# Patient Record
Sex: Female | Born: 1955 | Race: White | Hispanic: No | Marital: Married | Smoking: Current every day smoker
Health system: Southern US, Community
[De-identification: ages and names within clinical notes are randomized; demographics above are authoritative.]

## PROBLEM LIST (undated history)

## (undated) DIAGNOSIS — Z87442 Personal history of urinary calculi: Secondary | ICD-10-CM

## (undated) DIAGNOSIS — E119 Type 2 diabetes mellitus without complications: Secondary | ICD-10-CM

## (undated) DIAGNOSIS — E7800 Pure hypercholesterolemia, unspecified: Secondary | ICD-10-CM

## (undated) DIAGNOSIS — K2100 Gastro-esophageal reflux disease with esophagitis, without bleeding: Secondary | ICD-10-CM

## (undated) DIAGNOSIS — N2889 Other specified disorders of kidney and ureter: Secondary | ICD-10-CM

## (undated) DIAGNOSIS — M542 Cervicalgia: Secondary | ICD-10-CM

## (undated) DIAGNOSIS — N189 Chronic kidney disease, unspecified: Secondary | ICD-10-CM

## (undated) DIAGNOSIS — R079 Chest pain, unspecified: Secondary | ICD-10-CM

## (undated) DIAGNOSIS — R51 Headache: Secondary | ICD-10-CM

## (undated) DIAGNOSIS — I2510 Atherosclerotic heart disease of native coronary artery without angina pectoris: Secondary | ICD-10-CM

## (undated) DIAGNOSIS — R011 Cardiac murmur, unspecified: Secondary | ICD-10-CM

## (undated) DIAGNOSIS — R002 Palpitations: Secondary | ICD-10-CM

## (undated) DIAGNOSIS — I639 Cerebral infarction, unspecified: Secondary | ICD-10-CM

## (undated) DIAGNOSIS — G459 Transient cerebral ischemic attack, unspecified: Secondary | ICD-10-CM

## (undated) DIAGNOSIS — B159 Hepatitis A without hepatic coma: Secondary | ICD-10-CM

## (undated) DIAGNOSIS — M1990 Unspecified osteoarthritis, unspecified site: Secondary | ICD-10-CM

## (undated) DIAGNOSIS — I10 Essential (primary) hypertension: Secondary | ICD-10-CM

## (undated) DIAGNOSIS — C801 Malignant (primary) neoplasm, unspecified: Secondary | ICD-10-CM

## (undated) DIAGNOSIS — F32A Depression, unspecified: Secondary | ICD-10-CM

## (undated) DIAGNOSIS — K219 Gastro-esophageal reflux disease without esophagitis: Secondary | ICD-10-CM

## (undated) DIAGNOSIS — F17210 Nicotine dependence, cigarettes, uncomplicated: Secondary | ICD-10-CM

## (undated) DIAGNOSIS — F419 Anxiety disorder, unspecified: Secondary | ICD-10-CM

## (undated) DIAGNOSIS — R519 Headache, unspecified: Secondary | ICD-10-CM

## (undated) DIAGNOSIS — E669 Obesity, unspecified: Secondary | ICD-10-CM

## (undated) DIAGNOSIS — Z8719 Personal history of other diseases of the digestive system: Secondary | ICD-10-CM

## (undated) HISTORY — DX: Atherosclerotic heart disease of native coronary artery without angina pectoris: I25.10

## (undated) HISTORY — DX: Chest pain, unspecified: R07.9

## (undated) HISTORY — DX: Gastro-esophageal reflux disease with esophagitis, without bleeding: K21.00

## (undated) HISTORY — DX: Obesity, unspecified: E66.9

## (undated) HISTORY — DX: Cervicalgia: M54.2

## (undated) HISTORY — PX: LAPAROSCOPIC NEPHRECTOMY: SHX1930

## (undated) HISTORY — PX: NEPHRECTOMY: SHX65

## (undated) HISTORY — DX: Nicotine dependence, cigarettes, uncomplicated: F17.210

## (undated) HISTORY — PX: CAROTID STENT: SHX1301

## (undated) HISTORY — DX: Gastro-esophageal reflux disease with esophagitis: K21.0

## (undated) HISTORY — PX: HERNIA REPAIR: SHX51

## (undated) NOTE — *Deleted (*Deleted)
Diabetes Mellitus and Nutrition, Adult When you have diabetes (diabetes mellitus), it is very important to have healthy eating habits because your blood sugar (glucose) levels are greatly affected by what you eat and drink. Eating healthy foods in the appropriate amounts, at about the same times every day, can help you:  Control your blood glucose.  Lower your risk of heart disease.  Improve your blood pressure.  Reach or maintain a healthy weight. Every person with diabetes is different, and each person has different needs for a meal plan. Your health care provider may recommend that you work with a diet and nutrition specialist (dietitian) to make a meal plan that is best for you. Your meal plan may vary depending on factors such as:  The calories you need.  The medicines you take.  Your weight.  Your blood glucose, blood pressure, and cholesterol levels.  Your activity level.  Other health conditions you have, such as heart or kidney disease. How do carbohydrates affect me? Carbohydrates, also called carbs, affect your blood glucose level more than any other type of food. Eating carbs naturally raises the amount of glucose in your blood. Carb counting is a method for keeping track of how many carbs you eat. Counting carbs is important to keep your blood glucose at a healthy level, especially if you use insulin or take certain oral diabetes medicines. It is important to know how many carbs you can safely have in each meal. This is different for every person. Your dietitian can help you calculate how many carbs you should have at each meal and for each snack. Foods that contain carbs include:  Bread, cereal, rice, pasta, and crackers.  Potatoes and corn.  Peas, beans, and lentils.  Milk and yogurt.  Fruit and juice.  Desserts, such as cakes, cookies, ice cream, and candy. How does alcohol affect me? Alcohol can cause a sudden decrease in blood glucose (hypoglycemia),  especially if you use insulin or take certain oral diabetes medicines. Hypoglycemia can be a life-threatening condition. Symptoms of hypoglycemia (sleepiness, dizziness, and confusion) are similar to symptoms of having too much alcohol. If your health care provider says that alcohol is safe for you, follow these guidelines:  Limit alcohol intake to no more than 1 drink per day for nonpregnant women and 2 drinks per day for men. One drink equals 12 oz of beer, 5 oz of wine, or 1 oz of hard liquor.  Do not drink on an empty stomach.  Keep yourself hydrated with water, diet soda, or unsweetened iced tea.  Keep in mind that regular soda, juice, and other mixers may contain a lot of sugar and must be counted as carbs. What are tips for following this plan?  Reading food labels  Start by checking the serving size on the "Nutrition Facts" label of packaged foods and drinks. The amount of calories, carbs, fats, and other nutrients listed on the label is based on one serving of the item. Many items contain more than one serving per package.  Check the total grams (g) of carbs in one serving. You can calculate the number of servings of carbs in one serving by dividing the total carbs by 15. For example, if a food has 30 g of total carbs, it would be equal to 2 servings of carbs.  Check the number of grams (g) of saturated and trans fats in one serving. Choose foods that have low or no amount of these fats.  Check the number of   milligrams (mg) of salt (sodium) in one serving. Most people should limit total sodium intake to less than 2,300 mg per day.  Always check the nutrition information of foods labeled as "low-fat" or "nonfat". These foods may be higher in added sugar or refined carbs and should be avoided.  Talk to your dietitian to identify your daily goals for nutrients listed on the label. Shopping  Avoid buying canned, premade, or processed foods. These foods tend to be high in fat, sodium,  and added sugar.  Shop around the outside edge of the grocery store. This includes fresh fruits and vegetables, bulk grains, fresh meats, and fresh dairy. Cooking  Use low-heat cooking methods, such as baking, instead of high-heat cooking methods like deep frying.  Cook using healthy oils, such as olive, canola, or sunflower oil.  Avoid cooking with butter, cream, or high-fat meats. Meal planning  Eat meals and snacks regularly, preferably at the same times every day. Avoid going long periods of time without eating.  Eat foods high in fiber, such as fresh fruits, vegetables, beans, and whole grains. Talk to your dietitian about how many servings of carbs you can eat at each meal.  Eat 4-6 ounces (oz) of lean protein each day, such as lean meat, chicken, fish, eggs, or tofu. One oz of lean protein is equal to: ? 1 oz of meat, chicken, or fish. ? 1 egg. ?  cup of tofu.  Eat some foods each day that contain healthy fats, such as avocado, nuts, seeds, and fish. Lifestyle  Check your blood glucose regularly.  Exercise regularly as told by your health care provider. This may include: ? 150 minutes of moderate-intensity or vigorous-intensity exercise each week. This could be brisk walking, biking, or water aerobics. ? Stretching and doing strength exercises, such as yoga or weightlifting, at least 2 times a week.  Take medicines as told by your health care provider.  Do not use any products that contain nicotine or tobacco, such as cigarettes and e-cigarettes. If you need help quitting, ask your health care provider.  Work with a counselor or diabetes educator to identify strategies to manage stress and any emotional and social challenges. Questions to ask a health care provider  Do I need to meet with a diabetes educator?  Do I need to meet with a dietitian?  What number can I call if I have questions?  When are the best times to check my blood glucose? Where to find more  information:  American Diabetes Association: diabetes.org  Academy of Nutrition and Dietetics: www.eatright.org  National Institute of Diabetes and Digestive and Kidney Diseases (NIH): www.niddk.nih.gov Summary  A healthy meal plan will help you control your blood glucose and maintain a healthy lifestyle.  Working with a diet and nutrition specialist (dietitian) can help you make a meal plan that is best for you.  Keep in mind that carbohydrates (carbs) and alcohol have immediate effects on your blood glucose levels. It is important to count carbs and to use alcohol carefully. This information is not intended to replace advice given to you by your health care provider. Make sure you discuss any questions you have with your health care provider. Document Revised: 02/11/2017 Document Reviewed: 04/05/2016 Elsevier Patient Education  2020 Elsevier Inc.  

---

## 1976-03-15 HISTORY — PX: CHOLECYSTECTOMY OPEN: SUR202

## 1989-03-15 HISTORY — PX: VAGINAL HYSTERECTOMY: SUR661

## 2000-08-29 ENCOUNTER — Ambulatory Visit (HOSPITAL_COMMUNITY): Admission: RE | Admit: 2000-08-29 | Discharge: 2000-08-29 | Payer: Self-pay | Admitting: Pulmonary Disease

## 2000-10-12 ENCOUNTER — Other Ambulatory Visit: Admission: RE | Admit: 2000-10-12 | Discharge: 2000-10-12 | Payer: Self-pay | Admitting: Obstetrics and Gynecology

## 2000-11-06 ENCOUNTER — Encounter: Payer: Self-pay | Admitting: *Deleted

## 2000-11-07 ENCOUNTER — Encounter: Payer: Self-pay | Admitting: Cardiology

## 2000-11-07 ENCOUNTER — Inpatient Hospital Stay (HOSPITAL_COMMUNITY): Admission: EM | Admit: 2000-11-07 | Discharge: 2000-11-08 | Payer: Self-pay | Admitting: *Deleted

## 2003-04-08 ENCOUNTER — Emergency Department (HOSPITAL_COMMUNITY): Admission: EM | Admit: 2003-04-08 | Discharge: 2003-04-08 | Payer: Self-pay | Admitting: *Deleted

## 2003-07-01 ENCOUNTER — Ambulatory Visit (HOSPITAL_COMMUNITY): Admission: RE | Admit: 2003-07-01 | Discharge: 2003-07-01 | Payer: Self-pay | Admitting: Pulmonary Disease

## 2003-09-10 ENCOUNTER — Encounter: Admission: RE | Admit: 2003-09-10 | Discharge: 2003-12-09 | Payer: Self-pay | Admitting: *Deleted

## 2004-01-15 ENCOUNTER — Ambulatory Visit: Payer: Self-pay | Admitting: Gastroenterology

## 2004-01-27 ENCOUNTER — Ambulatory Visit: Payer: Self-pay | Admitting: *Deleted

## 2004-01-28 ENCOUNTER — Emergency Department (HOSPITAL_COMMUNITY): Admission: EM | Admit: 2004-01-28 | Discharge: 2004-01-28 | Payer: Self-pay | Admitting: Emergency Medicine

## 2004-05-27 ENCOUNTER — Ambulatory Visit: Payer: Self-pay | Admitting: Internal Medicine

## 2004-05-29 ENCOUNTER — Ambulatory Visit: Payer: Self-pay | Admitting: Cardiology

## 2004-06-10 ENCOUNTER — Ambulatory Visit (HOSPITAL_COMMUNITY): Admission: RE | Admit: 2004-06-10 | Discharge: 2004-06-10 | Payer: Self-pay | Admitting: Internal Medicine

## 2004-06-10 ENCOUNTER — Ambulatory Visit: Payer: Self-pay | Admitting: Internal Medicine

## 2004-07-20 ENCOUNTER — Ambulatory Visit (HOSPITAL_COMMUNITY): Admission: RE | Admit: 2004-07-20 | Discharge: 2004-07-20 | Payer: Self-pay | Admitting: Pulmonary Disease

## 2004-08-05 ENCOUNTER — Ambulatory Visit (HOSPITAL_COMMUNITY): Admission: RE | Admit: 2004-08-05 | Discharge: 2004-08-05 | Payer: Self-pay | Admitting: Pulmonary Disease

## 2004-08-07 ENCOUNTER — Ambulatory Visit: Payer: Self-pay | Admitting: *Deleted

## 2004-11-11 ENCOUNTER — Ambulatory Visit: Payer: Self-pay | Admitting: Cardiology

## 2004-12-30 ENCOUNTER — Ambulatory Visit: Payer: Self-pay | Admitting: *Deleted

## 2005-01-21 ENCOUNTER — Ambulatory Visit (HOSPITAL_COMMUNITY): Admission: RE | Admit: 2005-01-21 | Discharge: 2005-01-21 | Payer: Self-pay | Admitting: Family Medicine

## 2005-02-12 ENCOUNTER — Ambulatory Visit: Payer: Self-pay | Admitting: Cardiology

## 2005-02-12 ENCOUNTER — Ambulatory Visit (HOSPITAL_COMMUNITY): Admission: RE | Admit: 2005-02-12 | Discharge: 2005-02-12 | Payer: Self-pay | Admitting: *Deleted

## 2005-02-24 ENCOUNTER — Ambulatory Visit: Payer: Self-pay | Admitting: *Deleted

## 2005-03-02 ENCOUNTER — Ambulatory Visit (HOSPITAL_COMMUNITY): Admission: RE | Admit: 2005-03-02 | Discharge: 2005-03-02 | Payer: Self-pay | Admitting: General Surgery

## 2005-05-05 ENCOUNTER — Ambulatory Visit: Payer: Self-pay | Admitting: Internal Medicine

## 2005-05-24 ENCOUNTER — Ambulatory Visit: Payer: Self-pay | Admitting: Internal Medicine

## 2005-06-28 ENCOUNTER — Ambulatory Visit: Payer: Self-pay | Admitting: Internal Medicine

## 2005-07-02 ENCOUNTER — Ambulatory Visit: Payer: Self-pay | Admitting: Internal Medicine

## 2005-08-30 ENCOUNTER — Ambulatory Visit: Payer: Self-pay | Admitting: Internal Medicine

## 2005-10-13 ENCOUNTER — Ambulatory Visit: Payer: Self-pay | Admitting: Internal Medicine

## 2005-10-14 ENCOUNTER — Ambulatory Visit: Payer: Self-pay | Admitting: Family Medicine

## 2005-11-19 ENCOUNTER — Ambulatory Visit: Payer: Self-pay | Admitting: Internal Medicine

## 2005-12-03 ENCOUNTER — Ambulatory Visit: Payer: Self-pay | Admitting: Internal Medicine

## 2005-12-13 ENCOUNTER — Ambulatory Visit: Payer: Self-pay | Admitting: Internal Medicine

## 2005-12-19 ENCOUNTER — Emergency Department (HOSPITAL_COMMUNITY): Admission: EM | Admit: 2005-12-19 | Discharge: 2005-12-19 | Payer: Self-pay | Admitting: Emergency Medicine

## 2005-12-22 ENCOUNTER — Ambulatory Visit: Payer: Self-pay | Admitting: Cardiology

## 2005-12-31 ENCOUNTER — Ambulatory Visit: Payer: Self-pay | Admitting: Internal Medicine

## 2006-03-31 ENCOUNTER — Ambulatory Visit (HOSPITAL_COMMUNITY): Admission: RE | Admit: 2006-03-31 | Discharge: 2006-03-31 | Payer: Self-pay | Admitting: Pulmonary Disease

## 2006-05-25 ENCOUNTER — Ambulatory Visit: Payer: Self-pay | Admitting: Cardiology

## 2006-06-08 ENCOUNTER — Ambulatory Visit (HOSPITAL_COMMUNITY): Admission: RE | Admit: 2006-06-08 | Discharge: 2006-06-08 | Payer: Self-pay | Admitting: Pulmonary Disease

## 2007-06-10 ENCOUNTER — Emergency Department (HOSPITAL_COMMUNITY): Admission: EM | Admit: 2007-06-10 | Discharge: 2007-06-10 | Payer: Self-pay | Admitting: Emergency Medicine

## 2007-07-11 ENCOUNTER — Ambulatory Visit (HOSPITAL_COMMUNITY): Admission: RE | Admit: 2007-07-11 | Discharge: 2007-07-11 | Payer: Self-pay | Admitting: Pulmonary Disease

## 2007-08-11 ENCOUNTER — Ambulatory Visit (HOSPITAL_COMMUNITY): Admission: RE | Admit: 2007-08-11 | Discharge: 2007-08-11 | Payer: Self-pay | Admitting: Pulmonary Disease

## 2007-11-14 ENCOUNTER — Ambulatory Visit (HOSPITAL_COMMUNITY): Admission: RE | Admit: 2007-11-14 | Discharge: 2007-11-14 | Payer: Self-pay | Admitting: Obstetrics & Gynecology

## 2008-03-15 HISTORY — PX: COLONOSCOPY: SHX174

## 2008-09-13 DIAGNOSIS — E7800 Pure hypercholesterolemia, unspecified: Secondary | ICD-10-CM | POA: Insufficient documentation

## 2008-09-13 DIAGNOSIS — K219 Gastro-esophageal reflux disease without esophagitis: Secondary | ICD-10-CM | POA: Insufficient documentation

## 2008-09-13 DIAGNOSIS — F418 Other specified anxiety disorders: Secondary | ICD-10-CM | POA: Insufficient documentation

## 2008-09-13 DIAGNOSIS — R079 Chest pain, unspecified: Secondary | ICD-10-CM | POA: Insufficient documentation

## 2008-09-13 DIAGNOSIS — I10 Essential (primary) hypertension: Secondary | ICD-10-CM | POA: Insufficient documentation

## 2008-09-13 DIAGNOSIS — F329 Major depressive disorder, single episode, unspecified: Secondary | ICD-10-CM | POA: Insufficient documentation

## 2009-02-04 ENCOUNTER — Ambulatory Visit (HOSPITAL_COMMUNITY): Admission: RE | Admit: 2009-02-04 | Discharge: 2009-02-04 | Payer: Self-pay | Admitting: Cardiovascular Disease

## 2009-02-04 ENCOUNTER — Ambulatory Visit (HOSPITAL_COMMUNITY): Admission: RE | Admit: 2009-02-04 | Discharge: 2009-02-04 | Payer: Self-pay | Admitting: Pulmonary Disease

## 2009-07-11 ENCOUNTER — Ambulatory Visit (HOSPITAL_COMMUNITY)
Admission: RE | Admit: 2009-07-11 | Discharge: 2009-07-11 | Payer: Self-pay | Source: Home / Self Care | Admitting: Cardiology

## 2010-04-06 ENCOUNTER — Encounter: Payer: Self-pay | Admitting: Obstetrics & Gynecology

## 2010-04-14 ENCOUNTER — Ambulatory Visit (HOSPITAL_COMMUNITY)
Admission: RE | Admit: 2010-04-14 | Discharge: 2010-04-14 | Payer: Self-pay | Source: Home / Self Care | Attending: Pulmonary Disease | Admitting: Pulmonary Disease

## 2010-07-31 NOTE — Letter (Signed)
May 25, 2006     RE:  Grace Hess, Grace Hess  MRN:  [id number]  /  DOB:  12/04/1955   ADDENDUM  Grace Hess has never had an influenza vaccine nor a pneumococcal  vaccine.  We administered the latter and advised her to receive an  influenza vaccine next year in your office.    Sincerely,      Cristopher Estimable. Lattie Haw, MD, [facility]    RMR/MedQ  DD: 05/25/2006  DT: 05/27/2006  Job #: [id number]

## 2010-07-31 NOTE — Op Note (Signed)
NAMESHANETHIA, BERBERICK              ACCOUNT NO.:  [id number]   MEDICAL RECORD NO.:  [id number]          PATIENT TYPE:  AMB   LOCATION:  DAY                           FACILITY:  [facility]   PHYSICIAN:  Hildred Laser, M.D.    DATE OF BIRTH:  15-Jan-1956   DATE OF PROCEDURE:  06/10/2004  DATE OF DISCHARGE:                                 OPERATIVE REPORT   PROCEDURE:  Esophagogastroduodenoscopy with esophageal dilation.   INDICATIONS:  Grace Hess is a 55 year old Caucasian female with a few years'  history of symptoms of GERD who also has intermittent solid food dysphagia,  and she points to her suprasternal area. Since she has been on Nexium, she  has noted relief of her heartburn and regurgitation but not dysphagia. She  is undergoing diagnostic and therapeutic EGD. Procedure and risks were  reviewed the patient, and informed consent was obtained.   PREMEDICATION:  Cetacaine spray for pharyngeal topical anesthesia, Demerol  40 mg IV, Versed 14 mg IV in divided dose.   FINDINGS:  Procedure performed in endoscopy suite. The patient's vital signs  and O2 saturation were monitored during procedure and remained stable. The  patient was placed left lateral position. Olympus videoscope was passed via  oropharynx without any difficulty into esophagus.   Esophagus. There was a small sessile polyp or papilloma at 22 cm from the  incisors. This was completely ablated via cold biopsy. Mucosa of the rest of  the esophagus normal. GE junction was a 36 cm and hiatus was 38. There was  no ring or stricture noted.   Stomach. It was empty and distended very well insufflation. Folds of the  proximal stomach were normal. Examination mucosa at body, antrum, pyloric  channel as well as folds of proximal stomach were normal. Examination of  mucosa revealed patchy erythema at body and antrum with some granularity but  no erosions or ulcers were noted. Pyloric channel was patent. Angularis,  fundus and cardia were  examined by retroflexing the scope were normal.   Duodenum. Bulbar mucosa was normal. Scope was passed to the second part of  duodenum where mucosa and folds were normal. Endoscope was withdrawn.   Esophagus was dilated by passing 56-French Maloney dilator to full  insertion. As the dilator was withdrawn, endoscope was passed again and  esophagus reexamined, and there was no mucosal disruption. Endoscope was  withdrawn. The patient tolerated the procedure well.   FINAL DIAGNOSIS:  1.  Small sessile polyp or papilloma at proximal esophagus which was ablated      by cold biopsy. No evidence of ring or stricture formation.  2.  Small sliding hiatal hernia. Nonerosive antral gastritis.  3.  Esophagus was dilated by passing 56-French Maloney dilator given history      of dysphagia.   RECOMMENDATIONS:  1.  She will continue anti-reflux measures and Nexium at 40 mg p.o. q.a.m.      Prescription given for a month with three refills.  2.  H pylori serology will be checked today.  3.  I will be contacting with results of biopsy and blood test.  NR/MEDQ  D:  06/10/2004  T:  06/10/2004  Job:  [id number]   cc:   Percell Miller L. Luan Pulling, M.D.  [place]  [place] [address]  Fax: [phone]

## 2010-07-31 NOTE — Letter (Signed)
May 25, 2006     RE:  Grace Hess, Grace Hess  MRN:  [id number]  /  DOB:  01/20/1956   Velvet Bathe, MD  [address], [place]  [place]   Dear Ed:   [name] returns to the o'clock for assessment and treatment a  cardiovascular risk factors in the setting of diabetes and mild  cerebrovascular atherosclerosis.  Since her last visit she has done  well.  She has had no recurrent emergency department evaluations.  She  has had no significant chest discomfort.  She was not able to arrange  hypnosis therapy for discontinuation of cigarette smoking.  She has a  prescription for Chantix, but has not had yet filled it due to financial  considerations.   A lipid profile as performed in December and was suboptimal with a total  cholesterol of 239, triglycerides of 272, HDL 39 and LDL 146.  She was  taking Zetia 10 mg at that time as well as aspirin 81 mg daily, Toprol  25 mg daily, Nexium 40 mg daily, and Xanax 1 mg daily.   [name] has noted increased hair loss.  She also feels that her hair  is thinner than it has been.   PHYSICAL EXAMINATION:  GENERAL APPEARANCE:  On exam this is a pleasant  woman with a raspy voice, but in no acute distress.  VITAL SIGNS:  The weight is 192; 6 pounds more than in October 2007.  Blood pressure 120/70, heart rate 85 and regular, and respirations 16.  HEENT:  No visible hair loss.  NECK:  Jugular venous distention.  No carotid bruits.  LUNGS:  The lungs are clear.  HEART:  Cardiac reveals normal first and second heart sounds. A fourth  heart sound is present.  ABDOMEN:  The abdomen is soft and nontender.  No organomegaly.  EXTREMITIES:  The extremities have no edema.   IMPRESSION:  Grace Hess is doing well from a symptomatic standpoint.  It is not clear if she has actually had hypertension.  Toprol will be  discontinued.  She will monitor her blood pressures and keep a record  for [place].   It is not clear if she has true hair loss.   We will check a thyroid  stimulating hormone level and iron studies.   Lipid control is suboptimal in the setting of diabetes.  We will add  Welchol 1 tablet three times a day with meals and recheck a lipid  profile in one month.   I will see this nice woman again in six months.   ADDENDUM:  Ms. Model has never had an influenza vaccine nor a  pneumococcal vaccine.  We administered the latter and advised her to  receive an influenza vaccine next year in your office.    Sincerely,      Cristopher Estimable. Lattie Haw, MD, [facility]  Electronically Signed    RMR/MedQ  DD: 05/25/2006  DT: 05/27/2006  Job #: [id number]

## 2010-07-31 NOTE — H&P (Signed)
NAME:  Grace Hess, Grace Hess              ACCOUNT NO.:  [id number]   MEDICAL RECORD NO.:  [id number]          PATIENT TYPE:  AMB   LOCATION:  DAY                           FACILITY:  [facility]   PHYSICIAN:  R. Garfield Cornea, M.D. DATE OF BIRTH:  12-24-1955   DATE OF ADMISSION:  DATE OF DISCHARGE:  LH                                HISTORY & PHYSICAL   CHIEF COMPLAINT:  Reflux not better.   HISTORY OF PRESENT ILLNESS:  [name] is a 55 year old Caucasian female  patient of Dr. Laural Golden, who returns in followup for acid reflux.  She was  last seen on April 25, 2004 for the same.  She was given a variety of PPI  samples and tried with Aciphex and Protonix and felt that Protonix worked  the best.  She did not try Nexium because she was told by her family member  that she would have diarrhea.  She has been on Protonix off and on since  that time and noted no significant improvement of her symptoms.  It sound  like she would take it for several weeks but if ran out of samples, would  take Pepcid p.r.n.  She does have nocturnal symptoms which she describes as  an epigastric pressure or fullness with the pain radiating into the chest.  She has belching, which relieves some of this pressure.  She has post  prandial pain in the retrosternal region which radiates into her back.  Sometimes the pain radiates into her right shoulder.  Denies any typical  heartburn symptoms.  When she swallows solid food, it seems to get stuck on  the way down.  Denies any vomiting.  Bowel movements are regular.  Denies  any melena or rectal bleeding.  She complains of early satiety.  She has  gained 6 pounds since we last saw her.   CURRENT MEDICATIONS:  1.  Tylenol p.r.n.  2.  Protonix 40 mg daily.  3.  Toprol XL 25 mg q.d.   ALLERGIES:  1.  CODEINE causes dyspnea and palpitations.  2.  SULFA causes GI upset.   PAST MEDICAL HISTORY:  1.  Hypercholesterolemia.  2.  Depression.  3.  Hypertension.   PAST  SURGICAL HISTORY:  Cholecystectomy for cholelithiasis over 20 years  ago.  She had a partial hysterectomy with repair of a cystocele as well.   FAMILY HISTORY:  Father died of emphysema.  She had a brother who died of an  accidental gunshot wound.  She has a son with Crohn's disease and one with  diabetes mellitus.   SOCIAL HISTORY:  She is married and has two sons.  She is raising her 31-  year-old grandson and keeps her 55-year-old grandson during the day.  Her son  is a patient of ours, Gustavo Lah.  She smokes 1-1/2 packs of cigarettes  daily and has done so for years.  She denies any alcohol consumption.   REVIEW OF SYSTEMS:  See HPI for GI.  CARDIOPULMONARY:  Denies any associated  diaphoresis with her retrosternal chest pain.  Denies any shortness of  breath or chronic  cough.   PHYSICAL EXAMINATION:  VITAL SIGNS:  Weight 198-1/2, up from 192 in  November, 2005.  Blood pressure 122/90, pulse 84.  GENERAL:  A pleasant, mildly obese Caucasian female in no acute distress.  SKIN:  Warm and dry.  No jaundice.  HEENT:  Pink sclerae.  Nonicteric.  Oropharyngeal mucosa moist and pink.  No  lesions, erythema, or exudate.  No lymphadenopathy or thyromegaly.  CHEST:  Lungs are clear to auscultation.  CARDIAC:  Regular rate and rhythm.  Normal S1 and S2.  No murmurs, rubs or  gallops.  ABDOMEN:  Positive bowel sounds.  Full but symmetrical.  Soft.  Nontender.  No organomegaly or masses.  No rebound tenderness or guarding.  No abdominal  hernias or bruits.  EXTREMITIES:  No edema.   IMPRESSION:  [name] is a 55 year old lady who continues to have  indigestion, retrosternal chest pressure and epigastric pressure, early  satiety, solid food dysphagia.  Symptoms inadequately controlled with proton  pump inhibitor therapy.  She does have some improvement with over-the-  counter antacids.  Suspect that she has gastroesophageal reflux disease;  however, given dysphagia, we will need to  rule out any complications such as  esophageal stricture or ring.  Also need to rule out peptic ulcer disease.   PLAN:  1.  EGD +/- esophageal dilatation in the near future by Dr. Laural Golden.  2.  Trial of Nexium 40 mg daily, box of samples given.  3.  Antireflux measures, including slow, gradual weight loss.  4.  Recommend colonoscopy at age 23.   The procedure is to be done by Dr. Laural Golden, who is her primary  gastroenterologist; however, today's visit co-signed by Dr. Sydell Axon in his  absence.      LL/MEDQ  D:  05/27/2004  T:  05/27/2004  Job:  [id number]

## 2010-07-31 NOTE — Letter (Signed)
Aug 03, 2006    Edward L. Luan Pulling, M.D.  [address]  [place], [place] [address]   RE:  Grace, Hess  MRN:  [id number]  /  DOB:  02/08/1956   Dear Jaquita Rector:   I asked [name] to join me in the office today for discussion about  her lipids.  She had stopped Zetia for no apparent reason.  She stopped  Niaspan due to tingling in her legs.  She is tolerating Welchol.  Her  last lipid profile was terrible with a total cholesterol of 330, fairly  low HDL and LDL that is unmeasurable due to high triglycerides.   [name] appears to have limited understanding of medical causality,  and it is difficult to explain to her a good approach for dealing with  her medications.  She wondered whether she should resume statins.  She  has already been started on every Statin known to man and failed trials  of them.  I explained that since she cannot take statins, she will need  multiple drugs to accomplish much of anything.  She appears to get that  concept.  She will resume Zetia, continue Welchol and try to take  Niaspan.  We will check a lipid profile and hemoglobin A1C in one month.  I will see her in September as previously planned.    Sincerely,      Cristopher Estimable. Lattie Haw, MD, [facility]  Electronically Signed    RMR/MedQ  DD: 08/03/2006  DT: 08/03/2006  Job #: [id number]

## 2010-07-31 NOTE — Letter (Signed)
December 22, 2005    Bonne Dolores, M.D.  [address] [place], [place] [address]   RE:  Grace, Hess  MRN:  [id number]  /  DOB:  October 12, 1955   Dear Elta Guadeloupe,   Grace Hess returned to the office as scheduled.  She previously was under  the care of Dr. Wilhemina Cash but is now transferred to my practice.  Grace Hess  noted significant palpitations along with her chest discomfort, but these  had resolved by the time she came to the emergency department.  She was seen  in the emergency department a few days ago for chest pains.  These were  sharp momentary discomforts that radiated through to her back at times and  down to the right flank at times.  There was associated diaphoresis but no  nausea or dyspnea.  Evaluation in the emergency department was negative.  Her discomfort has now resolved.   She continues to have issues with risk factor management. She has had  adverse reactions to multiple medications, most recently yet another statin.  She no longer takes metformin because her serum glucose has apparently  normalized.   CURRENT MEDICATIONS:  1. Xanax 1 mg daily.  2. Nexium 40 mg daily.  3. Toprol 25 mg q.d.  4. Antara 130 mg q.o.d.  5. Aspirin 81 mg q.d.  6. A course of doxycycline for an ear infection.   PHYSICAL EXAMINATION:  GENERAL:  Overweight woman with a raspy voice in no  acute distress.  VITAL SIGNS:  The weight is 186, 4 pounds less than in December 2006.  Blood  pressure 115/75, heart rate 75 and regular.  Respiratory rate 16.  NECK:  No jugular venous distention.  Normal carotid upstrokes without  bruits.  LUNGS:  Clear.  CARDIAC:  Normal first and second heart sounds.  Fourth heart sound present.  ABDOMEN:  Soft and nontender. No organomegaly.  EXTREMITIES:  No edema.   IMPRESSION:  Grace Hess is doing generally well.  Her chest discomfort does  not sound likely to be related to ischemia.  Her palpitations may have  represented a supraventricular  tachycardia, but have not been frequent  enough or severe enough to warrant further evaluation.   She did have atherosclerosis on carotid Dopplers obtained some years ago.  Accordingly, risk factor reduction is appropriate.  She plans to see a  hypnotist for assistance in discontinuing cigarette smoking.  Blood pressure  control is adequate.  She apparently does not require pharmacologic therapy  for diabetes.  We will attempt to manage her dyslipidemia with nonstatin  drugs.  Zetia will be started at 10 mg q.d. with lipid profile in one month  and a return office visit in six months.    Sincerely,      Cristopher Estimable. Lattie Haw, MD, [facility]    RMR/MedQ  /  Job #:  [id number]  DD:  12/22/2005 / DT:  12/24/2005

## 2010-07-31 NOTE — Procedures (Signed)
[name], Grace Hess              ACCOUNT NO.:  [id number]   MEDICAL RECORD NO.:  [id number]          PATIENT TYPE:  OUT   LOCATION:  RAD                           FACILITY:  [facility]   PHYSICIAN:  Jacqulyn Ducking, M.D. [facility] OF BIRTH:  [birth year]   DATE OF PROCEDURE:  02/12/2005  DATE OF DISCHARGE:                                  ECHOCARDIOGRAM   REFERRING PHYSICIAN:  Percell Miller L. Luan Pulling, M.D./Jeffrey Wilhemina Cash, M.D.   CLINICAL DATA:  A 55 year old woman with hypertension, diabetes and  cardiomegaly.   M-MODE TRACINGS:  Aorta 2.6, left atrium 3.6, septum 1.2, posterior wall  1.3, LV diastole 3.8, LV systole 2.3.   FINDINGS:  1.  Technically difficult, but adequate echocardiographic study.  2.  Normal left and right atrial size.  3.  Normal right ventricular size and function; mild right ventricular      hypertrophy.  4.  Mild sclerosis of a trileaflet aortic valve; mild annular calcification.  5.  Pulmonic valve not adequately imaged; proximal pulmonary artery not well-      seen, but grossly normal.  6.  Tricuspid and mitral valve suboptimally imaged; no significant      abnormalities except mild mitral annular calcification.  7.  Normal left ventricular size; mild concentric hypertrophy; normal      regional and global function.  8.  Normal inferior vena cava.      Jacqulyn Ducking, M.D. [facility]  Electronically Signed     RR/MEDQ  D:  02/14/2005  T:  02/15/2005  Job:  [id number]

## 2010-12-07 LAB — DIFFERENTIAL
Eosinophils Relative: 1
Lymphocytes Relative: 21
Lymphs Abs: 2
Monocytes Relative: 8
Neutrophils Relative %: 70

## 2010-12-07 LAB — BASIC METABOLIC PANEL
BUN: 14
Chloride: 107
GFR calc Af Amer: >60
GFR calc non Af Amer: >60
Potassium: 3.8

## 2010-12-07 LAB — CBC
HCT: 39.8
MCV: 88.1
Platelets: 248
RBC: 4.51
WBC: 9.8

## 2010-12-07 LAB — URINALYSIS, ROUTINE W REFLEX MICROSCOPIC
Bilirubin Urine: NEGATIVE
Glucose, UA: NEGATIVE
Ketones, ur: NEGATIVE
Protein, ur: NEGATIVE
Urobilinogen, UA: 0.2

## 2010-12-07 LAB — ACETAMINOPHEN LEVEL: Acetaminophen (Tylenol), Serum: <10 — ABNORMAL LOW

## 2010-12-07 LAB — URINE MICROSCOPIC-ADD ON

## 2011-02-16 ENCOUNTER — Ambulatory Visit (HOSPITAL_COMMUNITY)
Admission: RE | Admit: 2011-02-16 | Discharge: 2011-02-16 | Disposition: A | Discharge status: Home / Self Care | Payer: Managed Care, Other (non HMO) | Source: Ambulatory Visit | Attending: Pulmonary Disease | Admitting: Pulmonary Disease

## 2011-02-16 ENCOUNTER — Other Ambulatory Visit (HOSPITAL_COMMUNITY): Payer: Self-pay | Admitting: Pulmonary Disease

## 2011-02-16 DIAGNOSIS — R079 Chest pain, unspecified: Secondary | ICD-10-CM | POA: Insufficient documentation

## 2011-02-16 DIAGNOSIS — R0602 Shortness of breath: Secondary | ICD-10-CM | POA: Insufficient documentation

## 2011-03-18 ENCOUNTER — Encounter: Payer: Self-pay | Admitting: *Deleted

## 2011-03-18 ENCOUNTER — Emergency Department (HOSPITAL_COMMUNITY): Payer: Managed Care, Other (non HMO)

## 2011-03-18 ENCOUNTER — Emergency Department (HOSPITAL_COMMUNITY)
Admission: EM | Admit: 2011-03-18 | Discharge: 2011-03-19 | Disposition: A | Discharge status: Home / Self Care | Payer: Managed Care, Other (non HMO) | Attending: Emergency Medicine | Admitting: Emergency Medicine

## 2011-03-18 ENCOUNTER — Other Ambulatory Visit: Payer: Self-pay

## 2011-03-18 DIAGNOSIS — G459 Transient cerebral ischemic attack, unspecified: Secondary | ICD-10-CM | POA: Insufficient documentation

## 2011-03-18 DIAGNOSIS — Z7982 Long term (current) use of aspirin: Secondary | ICD-10-CM | POA: Insufficient documentation

## 2011-03-18 DIAGNOSIS — E119 Type 2 diabetes mellitus without complications: Secondary | ICD-10-CM | POA: Insufficient documentation

## 2011-03-18 DIAGNOSIS — R209 Unspecified disturbances of skin sensation: Secondary | ICD-10-CM | POA: Insufficient documentation

## 2011-03-18 DIAGNOSIS — E7800 Pure hypercholesterolemia, unspecified: Secondary | ICD-10-CM | POA: Insufficient documentation

## 2011-03-18 DIAGNOSIS — F17200 Nicotine dependence, unspecified, uncomplicated: Secondary | ICD-10-CM | POA: Insufficient documentation

## 2011-03-18 HISTORY — DX: Pure hypercholesterolemia, unspecified: E78.00

## 2011-03-18 LAB — CBC
HCT: 37 % (ref 36.0–46.0)
MCH: 30.7 pg (ref 26.0–34.0)
MCV: 90.2 fL (ref 78.0–100.0)
Platelets: 217 10*3/uL (ref 150–400)
RBC: 4.1 MIL/uL (ref 3.87–5.11)
WBC: 8.5 10*3/uL (ref 4.0–10.5)

## 2011-03-18 LAB — BASIC METABOLIC PANEL
Chloride: 104 mEq/L (ref 96–112)
GFR calc Af Amer: >90 mL/min (ref >90)
GFR calc non Af Amer: >90 mL/min (ref >90)
Potassium: 3.9 mEq/L (ref 3.5–5.1)
Sodium: 137 mEq/L (ref 135–145)

## 2011-03-18 LAB — DIFFERENTIAL
Basophils Absolute: 0 10*3/uL (ref 0.0–0.1)
Basophils Relative: 0 % (ref 0–1)
Neutro Abs: 4.4 10*3/uL (ref 1.7–7.7)
Neutrophils Relative %: 51 % (ref 43–77)

## 2011-03-18 MED ORDER — SODIUM CHLORIDE 0.9 % IV SOLN
Freq: Once | INTRAVENOUS | Status: AC
  Administered 2011-03-18: 20 mL/h via INTRAVENOUS

## 2011-03-18 NOTE — ED Notes (Signed)
While using computer , saw white, then noticed  Lt hand numb ,then lips numb.  Sl lip numbness now.

## 2011-03-18 NOTE — ED Provider Notes (Signed)
History     CSN: [id number]  Arrival date & time 03/18/11  1958   None     Chief Complaint  Patient presents with  . Hypertension    (Consider location/radiation/quality/duration/timing/severity/associated sxs/prior treatment) Patient is a 56 y.o. female presenting with hypertension. The history is provided by the patient. No language interpreter was used.  Hypertension This is a new problem. Episode onset: just prior to ED arrival. The problem occurs constantly. The problem has been resolved. Pertinent negatives include no chest pain, coughing, diaphoresis, fever, nausea, neck pain, vertigo, visual change, vomiting or weakness. Associated symptoms comments: Perioral and L hand numbness. " L hand wouldn't work right".  sxs completely resolved within 25- 30 min.  No prior TIA's or CVA's.  Pt feels back to her baseline at exam time..    Past Medical History  Diagnosis Date  . Diabetes mellitus   . Hypercholesteremia     History reviewed. No pertinent past surgical history.  History reviewed. No pertinent family history.  History  Substance Use Topics  . Smoking status: Current Everyday Smoker  . Smokeless tobacco: Not on file  . Alcohol Use: No    OB History    Grav Para Term Preterm Abortions TAB SAB Ect Mult Living                  Review of Systems  Constitutional: Negative for fever and diaphoresis.  HENT: Negative for neck pain.   Respiratory: Negative for cough.   Cardiovascular: Negative for chest pain.  Gastrointestinal: Negative for nausea and vomiting.  Neurological: Negative for vertigo and weakness.  All other systems reviewed and are negative.    Allergies  Codeine and Sulfa antibiotics  Home Medications   Current Outpatient Rx  Name Route Sig Dispense Refill  . ACETAMINOPHEN 500 MG PO TABS Oral Take 500 mg by mouth every 6 (six) hours as needed. For pain     . ALPRAZOLAM 1 MG PO TABS Oral Take 0.5 mg by mouth daily.      . ASPIRIN EC 81 MG PO  TBEC Oral Take 81 mg by mouth daily.      Marland Kitchen CARBAMIDE PEROXIDE 6.5 % OT SOLN Left Ear Place 5 drops into the left ear as needed. For ear pain     . DEXLANSOPRAZOLE 60 MG PO CPDR Oral Take 60 mg by mouth daily.      . IBUPROFEN 200 MG PO TABS Oral Take 200 mg by mouth 2 (two) times daily as needed. For pain     . METFORMIN HCL 500 MG PO TABS Oral Take 500 mg by mouth 2 (two) times daily with a meal.      . FISH OIL 1200 MG PO CAPS Oral Take 1 capsule by mouth daily.        BP 105/61  Pulse 65  Temp(Src) 97.8 F (36.6 C) (Oral)  Resp 20  Wt 171 lb (77.565 kg)  SpO2 95%  Physical Exam  Nursing note and vitals reviewed. Constitutional: She is oriented to person, place, and time. She appears well-developed and well-nourished. No distress.  HENT:  Head: Normocephalic and atraumatic.  Right Ear: External ear normal.  Left Ear: External ear normal.  Nose: Nose normal.  Mouth/Throat: Oropharynx is clear and moist.  Eyes: Conjunctivae and EOM are normal. Pupils are equal, round, and reactive to light.  Neck: Normal range of motion. Neck supple. No JVD present.  Cardiovascular: Normal rate, regular rhythm, S1 normal, S2 normal, normal heart  sounds and normal pulses.  PMI is not displaced.  Exam reveals no gallop.   No murmur heard. Pulmonary/Chest: Effort normal and breath sounds normal. No stridor.  Abdominal: Soft. She exhibits no distension. There is no tenderness.  Musculoskeletal: Normal range of motion.  Neurological: She is alert and oriented to person, place, and time. She has normal strength. No cranial nerve deficit or sensory deficit. GCS eye subscore is 4. GCS verbal subscore is 5. GCS motor subscore is 6.  Reflex Scores:      Tricep reflexes are 2+ on the right side and 2+ on the left side.      Bicep reflexes are 2+ on the right side and 2+ on the left side.      Brachioradialis reflexes are 2+ on the right side and 2+ on the left side.      Patellar reflexes are 2+ on the  right side and 2+ on the left side.      Achilles reflexes are 2+ on the right side and 2+ on the left side. Skin: Skin is warm and dry. She is not diaphoretic.  Psychiatric: She has a normal mood and affect. Judgment normal.    ED Course  Procedures (including critical care time)  Labs Reviewed  BASIC METABOLIC PANEL - Abnormal; Notable for the following:    Glucose, Bld 160 (*)    All other components within normal limits  CBC  DIFFERENTIAL   Ct Head Wo Contrast  03/18/2011  *RADIOLOGY REPORT*  Clinical Data: Left-sided numbness.  Hypertension.  CT HEAD WITHOUT CONTRAST  Technique:  Contiguous axial images were obtained from the base of the skull through the vertex without contrast.  Comparison: MRI 07/20/2004  Findings: No acute intracranial abnormality.  Specifically, no hemorrhage, hydrocephalus, mass lesion, acute infarction, or significant intracranial injury.  No acute calvarial abnormality. Right mastoids are clear.  Left mastoids are opacified.  Orbital soft tissues unremarkable.  IMPRESSION: No intracranial abnormality.  Opacified left mastoid air cells.  Original Report Authenticated By: Raelyn Number, M.D.     No diagnosis found.    Makanda, PA 03/19/11 [phone]

## 2011-03-18 NOTE — ED Notes (Signed)
Pt states she is ready to see md & go. Pa notified.

## 2011-03-19 NOTE — ED Provider Notes (Signed)
Medical screening examination/treatment/procedure(s) were performed by non-physician practitioner and as supervising physician I was immediately available for consultation/collaboration.  Jasper Riling. Alvino Chapel, [place] 03/19/11 [phone]

## 2011-03-19 NOTE — ED Notes (Signed)
Pt given discharge instructions, paperwork, pt verbalized understanding.   

## 2011-03-19 NOTE — ED Notes (Signed)
Patient continues to complain of headache. PA notified.

## 2011-12-28 ENCOUNTER — Ambulatory Visit (INDEPENDENT_AMBULATORY_CARE_PROVIDER_SITE_OTHER): Payer: BC Managed Care – PPO | Admitting: Internal Medicine

## 2011-12-28 ENCOUNTER — Encounter (INDEPENDENT_AMBULATORY_CARE_PROVIDER_SITE_OTHER): Payer: Self-pay | Admitting: Internal Medicine

## 2011-12-28 VITALS — BP 96/50 | HR 72 | Temp 97.9°F | Ht 66.0 in | Wt 164.5 lb

## 2011-12-28 DIAGNOSIS — K219 Gastro-esophageal reflux disease without esophagitis: Secondary | ICD-10-CM

## 2011-12-28 MED ORDER — PANTOPRAZOLE SODIUM 40 MG PO TBEC: 40.0000 mg | DELAYED_RELEASE_TABLET | Freq: Every day | ORAL | Status: DC

## 2011-12-28 NOTE — Progress Notes (Signed)
Subjective:     Patient ID: Grace Hess, female   DOB: 1955-06-02, 56 y.o.   MRN: [id number]  HPIPresents today stating the Dexilant is not working.  She tells me she is taking an Copywriter, advertising at night. She takes the [place] in the evening before her meal. She is having acid reflux every other day. She has even having break thru acid reflux. She has bloating and also has a lot of flatus. Appetite is good. No weight loss.  No abdominal pain. Usually has a BM about 5 a day and are formed. Last colonoscopy 1 1/2 yrs ago which was normal except for diverticulosis  EGD 2006:1. Small sessile polyp or papilloma at proximal esophagus which was ablated  by cold biopsy. No evidence of ring or stricture formation.  2. Small sliding hiatal hernia. Nonerosive antral gastritis.  3. Esophagus was dilated by passing 56-French Maloney dilator given history  of dysphagia.    Review of Systems see hpi Current Outpatient Prescriptions  Medication Sig Dispense Refill  . acetaminophen (TYLENOL) 500 MG tablet Take 500 mg by mouth every 6 (six) hours as needed. For pain       . ALPRAZolam (XANAX) 1 MG tablet Take 0.5 mg by mouth daily.        Marland Kitchen aspirin EC 81 MG tablet Take 81 mg by mouth daily.        Marland Kitchen dexlansoprazole (DEXILANT) 60 MG capsule Take 60 mg by mouth daily.        Marland Kitchen gabapentin (NEURONTIN) 300 MG capsule Take 300 mg by mouth 3 (three) times daily.      Marland Kitchen ibuprofen (ADVIL,MOTRIN) 200 MG tablet Take 200 mg by mouth 2 (two) times daily as needed. For pain       . lisinopril (PRINIVIL,ZESTRIL) 10 MG tablet Take 10 mg by mouth daily.      . metFORMIN (GLUCOPHAGE) 500 MG tablet Take 500 mg by mouth 2 (two) times daily with a meal.        . Omega-3 Fatty Acids (FISH OIL) 1200 MG CAPS Take 1 capsule by mouth daily.        . carbamide peroxide (EAR WAX REMOVAL AID) 6.5 % otic solution Place 5 drops into the left ear as needed. For ear pain        Past Medical History  Diagnosis Date  . Diabetes  mellitus   . Hypercholesteremia    Past Surgical History  Procedure Date  . Cholecystectomy   . Partial hysterectomy    History   Social History  . Marital Status: Married    Spouse Name: N/A    Number of Children: N/A  . Years of Education: N/A   Occupational History  . Not on file.   Social History Main Topics  . Smoking status: Current Every Day Smoker  . Smokeless tobacco: Not on file   Comment: 1 1/2 pack a day. Smoking since age 28  . Alcohol Use: No  . Drug Use: No  . Sexually Active: Not on file   Other Topics Concern  . Not on file   Social History Narrative  . No narrative on file   Family Status  Relation Status Death Age  . Mother Alive     osteoarthritis  . Father Deceased     emphysema  . Sister Alive     Both are diabetic  . Brother Alive     One has COPD, arthritis, Two in good health   Allergies  Allergen Reactions  . Codeine Shortness Of Breath  . Sulfa Antibiotics     Childhood allergy        Objective:   Physical Exam  Filed Vitals:   12/28/11 1133  BP: 96/50  Pulse: 72  Temp: 97.9 F (36.6 C)  Height: 5\' 6"  (1.676 m)  Weight: 164 lb 8 oz (74.617 kg)       Assessment:   GERD with break thru. She has tried multiple PPIs in the past.    Plan:    Rx for Omeprazole 40mg . Refill #11. OV in 1 year

## 2011-12-28 NOTE — Patient Instructions (Addendum)
Rx Protonix 40mg  daily.  OV in 1 yr. PR in 2 weeks to be sure the Protonix is working

## 2012-04-26 ENCOUNTER — Ambulatory Visit: Payer: BC Managed Care – PPO | Admitting: Cardiology

## 2012-05-03 ENCOUNTER — Encounter: Payer: Self-pay | Admitting: *Deleted

## 2012-05-04 ENCOUNTER — Encounter (INDEPENDENT_AMBULATORY_CARE_PROVIDER_SITE_OTHER): Payer: BC Managed Care – PPO | Admitting: Cardiovascular Disease

## 2012-05-04 ENCOUNTER — Telehealth: Payer: Self-pay | Admitting: Cardiovascular Disease

## 2012-05-04 ENCOUNTER — Encounter: Payer: Self-pay | Admitting: *Deleted

## 2012-05-04 DIAGNOSIS — R079 Chest pain, unspecified: Secondary | ICD-10-CM

## 2012-05-04 NOTE — Telephone Encounter (Signed)
Message left on machine to reschedule.  Letter also mailed. / tgs

## 2012-05-05 NOTE — Progress Notes (Signed)
Patient ID: Grace Hess, female   DOB: Aug 07, 1955, 57 y.o.   MRN: [id number] No Show

## 2012-12-26 ENCOUNTER — Encounter (INDEPENDENT_AMBULATORY_CARE_PROVIDER_SITE_OTHER): Payer: Self-pay | Admitting: *Deleted

## 2013-01-09 ENCOUNTER — Ambulatory Visit (INDEPENDENT_AMBULATORY_CARE_PROVIDER_SITE_OTHER): Payer: BC Managed Care – PPO | Admitting: Internal Medicine

## 2013-02-07 ENCOUNTER — Telehealth (INDEPENDENT_AMBULATORY_CARE_PROVIDER_SITE_OTHER): Payer: Self-pay | Admitting: *Deleted

## 2013-02-07 NOTE — Telephone Encounter (Signed)
Grace Hess has recently had a bout with diarrhea, this has gotten much better. She states that she would like to have Dicyclomine on hand in case she has another bout with the horrible abdominal cramping. She is under a a lot of stress caring for her sick mother in her home. Per Dr.Rehman, may call in the following Dicyclomine 10 mg- Take 1 by mouth twice a day #60 no refills. This was called to [facility]/ [place]. Patient was made aware.

## 2013-04-05 ENCOUNTER — Other Ambulatory Visit (INDEPENDENT_AMBULATORY_CARE_PROVIDER_SITE_OTHER): Payer: Self-pay | Admitting: *Deleted

## 2013-04-05 ENCOUNTER — Telehealth (INDEPENDENT_AMBULATORY_CARE_PROVIDER_SITE_OTHER): Payer: Self-pay | Admitting: *Deleted

## 2013-04-05 DIAGNOSIS — R197 Diarrhea, unspecified: Secondary | ICD-10-CM

## 2013-04-05 DIAGNOSIS — R109 Unspecified abdominal pain: Secondary | ICD-10-CM

## 2013-04-05 DIAGNOSIS — K219 Gastro-esophageal reflux disease without esophagitis: Secondary | ICD-10-CM

## 2013-04-05 MED ORDER — DICYCLOMINE HCL 10 MG PO CAPS: 10.0000 mg | ORAL_CAPSULE | Freq: Three times a day (TID) | ORAL | Status: DC

## 2013-04-05 MED ORDER — DEXLANSOPRAZOLE 60 MG PO CPDR: 60.0000 mg | DELAYED_RELEASE_CAPSULE | Freq: Every day | ORAL | Status: DC

## 2013-04-05 NOTE — Telephone Encounter (Signed)
Patient called asking for a refill on these medications. We last called in the dicyclomine 10 mg BID on 02-07-13. The Dexilant, patient rec'd samples from our office. Patient will need to have a office visit.

## 2013-04-05 NOTE — Telephone Encounter (Signed)
I called into [facility] in [place] the prescriptions for Dexilant  and Dicyclomine. Medications are listed in the patient 's medications.

## 2013-04-05 NOTE — Telephone Encounter (Signed)
dicontinue.

## 2013-06-11 ENCOUNTER — Other Ambulatory Visit (HOSPITAL_COMMUNITY): Payer: Self-pay

## 2013-06-11 DIAGNOSIS — G4730 Sleep apnea, unspecified: Secondary | ICD-10-CM

## 2013-07-17 ENCOUNTER — Other Ambulatory Visit (INDEPENDENT_AMBULATORY_CARE_PROVIDER_SITE_OTHER): Payer: Self-pay | Admitting: *Deleted

## 2013-07-17 DIAGNOSIS — K589 Irritable bowel syndrome without diarrhea: Secondary | ICD-10-CM

## 2013-07-17 MED ORDER — DICYCLOMINE HCL 10 MG PO CAPS: 10.0000 mg | ORAL_CAPSULE | Freq: Three times a day (TID) | ORAL | Status: DC

## 2013-10-15 ENCOUNTER — Other Ambulatory Visit (HOSPITAL_COMMUNITY): Payer: Self-pay | Admitting: Pulmonary Disease

## 2013-10-15 ENCOUNTER — Ambulatory Visit (HOSPITAL_COMMUNITY)
Admission: RE | Admit: 2013-10-15 | Discharge: 2013-10-15 | Disposition: A | Discharge status: Home / Self Care | Payer: 59 | Source: Ambulatory Visit | Attending: Pulmonary Disease | Admitting: Pulmonary Disease

## 2013-10-15 DIAGNOSIS — J9819 Other pulmonary collapse: Secondary | ICD-10-CM | POA: Insufficient documentation

## 2013-10-15 DIAGNOSIS — R059 Cough, unspecified: Secondary | ICD-10-CM | POA: Diagnosis not present

## 2013-10-15 DIAGNOSIS — R05 Cough: Secondary | ICD-10-CM

## 2013-10-20 ENCOUNTER — Encounter (HOSPITAL_COMMUNITY): Payer: Self-pay | Admitting: Emergency Medicine

## 2013-10-20 ENCOUNTER — Emergency Department (HOSPITAL_COMMUNITY)
Admission: EM | Admit: 2013-10-20 | Discharge: 2013-10-20 | Disposition: A | Discharge status: Home / Self Care | Payer: 59 | Attending: Emergency Medicine | Admitting: Emergency Medicine

## 2013-10-20 ENCOUNTER — Emergency Department (HOSPITAL_COMMUNITY): Payer: 59

## 2013-10-20 DIAGNOSIS — Z9089 Acquired absence of other organs: Secondary | ICD-10-CM | POA: Diagnosis not present

## 2013-10-20 DIAGNOSIS — K2100 Gastro-esophageal reflux disease with esophagitis, without bleeding: Secondary | ICD-10-CM | POA: Diagnosis not present

## 2013-10-20 DIAGNOSIS — E119 Type 2 diabetes mellitus without complications: Secondary | ICD-10-CM | POA: Insufficient documentation

## 2013-10-20 DIAGNOSIS — Z79899 Other long term (current) drug therapy: Secondary | ICD-10-CM | POA: Diagnosis not present

## 2013-10-20 DIAGNOSIS — E669 Obesity, unspecified: Secondary | ICD-10-CM | POA: Insufficient documentation

## 2013-10-20 DIAGNOSIS — N200 Calculus of kidney: Secondary | ICD-10-CM | POA: Insufficient documentation

## 2013-10-20 DIAGNOSIS — R319 Hematuria, unspecified: Secondary | ICD-10-CM | POA: Insufficient documentation

## 2013-10-20 DIAGNOSIS — N3000 Acute cystitis without hematuria: Secondary | ICD-10-CM | POA: Diagnosis not present

## 2013-10-20 DIAGNOSIS — F17200 Nicotine dependence, unspecified, uncomplicated: Secondary | ICD-10-CM | POA: Diagnosis not present

## 2013-10-20 DIAGNOSIS — N3001 Acute cystitis with hematuria: Secondary | ICD-10-CM

## 2013-10-20 DIAGNOSIS — Z7982 Long term (current) use of aspirin: Secondary | ICD-10-CM | POA: Diagnosis not present

## 2013-10-20 LAB — URINALYSIS, ROUTINE W REFLEX MICROSCOPIC
GLUCOSE, UA: 250 mg/dL — AB
KETONES UR: 15 mg/dL — AB
Nitrite: POSITIVE — AB
Specific Gravity, Urine: 1.015 (ref 1.005–1.030)
Urobilinogen, UA: 2 mg/dL — ABNORMAL HIGH (ref 0.0–1.0)
pH: 5 (ref 5.0–8.0)

## 2013-10-20 LAB — URINE MICROSCOPIC-ADD ON

## 2013-10-20 MED ORDER — HYDROMORPHONE HCL PF 2 MG/ML IJ SOLN
2.0000 mg | Freq: Once | INTRAMUSCULAR | Status: AC
  Administered 2013-10-20: 2 mg via INTRAMUSCULAR
  Filled 2013-10-20: qty 1

## 2013-10-20 MED ORDER — PHENAZOPYRIDINE HCL 200 MG PO TABS: 200.0000 mg | ORAL_TABLET | Freq: Three times a day (TID) | ORAL | Status: DC | PRN

## 2013-10-20 MED ORDER — CIPROFLOXACIN HCL 250 MG PO TABS
500.0000 mg | ORAL_TABLET | Freq: Once | ORAL | Status: AC
  Administered 2013-10-20: 500 mg via ORAL
  Filled 2013-10-20: qty 2

## 2013-10-20 MED ORDER — KETOROLAC TROMETHAMINE 60 MG/2ML IM SOLN
INTRAMUSCULAR | Status: AC
  Filled 2013-10-20: qty 2

## 2013-10-20 MED ORDER — KETOROLAC TROMETHAMINE 60 MG/2ML IM SOLN
60.0000 mg | Freq: Once | INTRAMUSCULAR | Status: AC
  Administered 2013-10-20: 60 mg via INTRAMUSCULAR

## 2013-10-20 MED ORDER — CIPROFLOXACIN HCL 500 MG PO TABS: 500.0000 mg | ORAL_TABLET | Freq: Two times a day (BID) | ORAL | Status: DC

## 2013-10-20 NOTE — ED Notes (Signed)
Pt c/o diarrhea yesterday, urinating blood and lower abd pain, chills today,

## 2013-10-20 NOTE — ED Provider Notes (Signed)
CSN: [id number]     Arrival date & time 10/20/13  1318 History  This chart was scribed for Grace Speak, MD by Ludger Nutting, ED Scribe. This patient was seen in room APA14/APA14 and the patient's care was started 1:42 PM.    Chief Complaint  Patient presents with  . Hematuria    The history is provided by the patient. No language interpreter was used.    HPI Comments: BERTHEL BEU is a 58 y.o. female who presents to the Emergency Department complaining of intermittent hematuria with associated lower abdominal pain and chills. She also reports associated dysuria and urgency. Patient states her current symptoms feel similar to when she was diagnosed with a kidney stone. She denies fever.   Past Medical History  Diagnosis Date  . Diabetes mellitus   . Hypercholesteremia   . Obesity   . Chest pain, unspecified   . Reflux esophagitis   . Cervicalgia    Past Surgical History  Procedure Laterality Date  . Cholecystectomy    . Partial hysterectomy     No family history on file. History  Substance Use Topics  . Smoking status: Current Every Day Smoker  . Smokeless tobacco: Not on file     Comment: 1 1/2 pack a day. Smoking since age 23  . Alcohol Use: No   OB History   Grav Para Term Preterm Abortions TAB SAB Ect Mult Living                 Review of Systems  A complete 10 system review of systems was obtained and all systems are negative except as noted in the HPI and PMH.    Allergies  Codeine and Sulfa antibiotics  Home Medications   Prior to Admission medications   Medication Sig Start Date End Date Taking? Authorizing Provider  ALPRAZolam Duanne Moron) 1 MG tablet Take 0.5 mg by mouth daily.      Historical Provider, MD  aspirin EC 81 MG tablet Take 81 mg by mouth daily.      Historical Provider, MD  colesevelam (WELCHOL) 625 MG tablet Take 3,750 mg by mouth daily.    Historical Provider, MD  dexlansoprazole (DEXILANT) 60 MG capsule Take 1 capsule (60 mg total) by mouth  daily. 04/05/13   Rogene Houston, MD  dicyclomine (BENTYL) 10 MG capsule Take 1 capsule (10 mg total) by mouth 4 (four) times daily -  before meals and at bedtime. Take 1 capsule (10 mg total) by mouth 3 (three) times daily - before meals 07/17/13   Rogene Houston, MD  gabapentin (NEURONTIN) 300 MG capsule Take 300 mg by mouth 3 (three) times daily.    Historical Provider, MD  lisinopril (PRINIVIL,ZESTRIL) 20 MG tablet Take 20 mg by mouth daily.    Historical Provider, MD  metFORMIN (GLUCOPHAGE) 500 MG tablet Take 500 mg by mouth 2 (two) times daily with a meal.      Historical Provider, MD  oxyCODONE (OXY IR/ROXICODONE) 5 MG immediate release tablet Take 5 mg by mouth every 6 (six) hours as needed.  04/08/12   Historical Provider, MD  sucralfate (CARAFATE) 1 G tablet Take 1 g by mouth 4 (four) times daily.  04/24/12   Historical Provider, MD   BP 139/72  Pulse 106  Temp(Src) 99.1 F (37.3 C) (Oral)  Ht 5\' 6"  (1.676 m)  Wt 160 lb (72.576 kg)  BMI 25.84 kg/m2  SpO2 97% Physical Exam  Nursing note and vitals reviewed. Constitutional: She  is oriented to person, place, and time. She appears well-developed and well-nourished.  HENT:  Head: Normocephalic and atraumatic.  Cardiovascular: Normal rate, regular rhythm and normal heart sounds.   Pulmonary/Chest: Effort normal and breath sounds normal. No respiratory distress. She has no wheezes. She has no rales.  Abdominal: She exhibits no distension. There is tenderness. There is no rebound and no guarding.  Mild suprapubic tenderness  Neurological: She is alert and oriented to person, place, and time.  Skin: Skin is warm and dry.  Psychiatric: She has a normal mood and affect.    ED Course  Procedures (including critical care time)  DIAGNOSTIC STUDIES: Oxygen Saturation is 97% on RA, adequate by my interpretation.    COORDINATION OF CARE: 1:45 PM Discussed treatment plan with pt at bedside and pt agreed to plan.  1:51 PM Discussed lab  results with patient and plan to order CT scan.    Labs Review Labs Reviewed  URINALYSIS, ROUTINE W REFLEX MICROSCOPIC    Imaging Review No results found.   EKG Interpretation None      MDM   Final diagnoses:  None    Workup reveals a renal calculus at the left UVJ with hydronephrosis. She was also found to have a urinary tract infection. This will be treated with Cipro and pain medication. She is to followup with [facility] if not improving in the next 2-3 days.  Do to the presence of infection, I offered the possibility of admission, however the patient was not open to this and states that she will return if her symptoms worsen or change.  I personally performed the services described in this documentation, which was scribed in my presence. The recorded information has been reviewed and is accurate.      Grace Speak, MD 10/21/13 2121

## 2013-10-20 NOTE — Discharge Instructions (Signed)
Cipro and pyridium as prescribed.  Return to the emergency department for severe abdominal pain, high fever with vomiting and inability to keep your medications down.   Urinary Tract Infection Urinary tract infections (UTIs) can develop anywhere along your urinary tract. Your urinary tract is your body's drainage system for removing wastes and extra water. Your urinary tract includes two kidneys, two ureters, a bladder, and a urethra. Your kidneys are a pair of bean-shaped organs. Each kidney is about the size of your fist. They are located below your ribs, one on each side of your spine. CAUSES Infections are caused by microbes, which are microscopic organisms, including fungi, viruses, and bacteria. These organisms are so small that they can only be seen through a microscope. Bacteria are the microbes that most commonly cause UTIs. SYMPTOMS  Symptoms of UTIs may vary by age and gender of the patient and by the location of the infection. Symptoms in young women typically include a frequent and intense urge to urinate and a painful, burning feeling in the bladder or urethra during urination. Older women and men are more likely to be tired, shaky, and weak and have muscle aches and abdominal pain. A fever may mean the infection is in your kidneys. Other symptoms of a kidney infection include pain in your back or sides below the ribs, nausea, and vomiting. DIAGNOSIS To diagnose a UTI, your caregiver will ask you about your symptoms. Your caregiver also will ask to provide a urine sample. The urine sample will be tested for bacteria and white blood cells. White blood cells are made by your body to help fight infection. TREATMENT  Typically, UTIs can be treated with medication. Because most UTIs are caused by a bacterial infection, they usually can be treated with the use of antibiotics. The choice of antibiotic and length of treatment depend on your symptoms and the type of bacteria causing your  infection. HOME CARE INSTRUCTIONS  If you were prescribed antibiotics, take them exactly as your caregiver instructs you. Finish the medication even if you feel better after you have only taken some of the medication.  Drink enough water and fluids to keep your urine clear or pale yellow.  Avoid caffeine, tea, and carbonated beverages. They tend to irritate your bladder.  Empty your bladder often. Avoid holding urine for long periods of time.  Empty your bladder before and after sexual intercourse.  After a bowel movement, women should cleanse from front to back. Use each tissue only once. SEEK MEDICAL CARE IF:   You have back pain.  You develop a fever.  Your symptoms do not begin to resolve within 3 days. SEEK IMMEDIATE MEDICAL CARE IF:   You have severe back pain or lower abdominal pain.  You develop chills.  You have nausea or vomiting.  You have continued burning or discomfort with urination. MAKE SURE YOU:   Understand these instructions.  Will watch your condition.  Will get help right away if you are not doing well or get worse. Document Released: 12/09/2004 Document Revised: 08/31/2011 Document Reviewed: 04/09/2011 [facility] Patient Information 2015 [place], [place]. This information is not intended to replace advice given to you by your health care provider. Make sure you discuss any questions you have with your health care provider.

## 2013-11-30 ENCOUNTER — Telehealth (INDEPENDENT_AMBULATORY_CARE_PROVIDER_SITE_OTHER): Payer: Self-pay | Admitting: *Deleted

## 2013-11-30 NOTE — Telephone Encounter (Signed)
Patient called and states that she is needing a refill on her Dicyclomine 10 mg. She takes 1 by mouth 3 times daily #90 with 2 refill. This was called to [facility]. Patient is aware.

## 2014-02-25 ENCOUNTER — Ambulatory Visit (HOSPITAL_COMMUNITY)
Admission: RE | Admit: 2014-02-25 | Discharge: 2014-02-25 | Disposition: A | Discharge status: Home / Self Care | Payer: 59 | Source: Ambulatory Visit | Attending: Pulmonary Disease | Admitting: Pulmonary Disease

## 2014-02-25 ENCOUNTER — Other Ambulatory Visit (HOSPITAL_COMMUNITY): Payer: Self-pay | Admitting: Pulmonary Disease

## 2014-02-25 DIAGNOSIS — M79672 Pain in left foot: Secondary | ICD-10-CM

## 2014-02-25 DIAGNOSIS — E119 Type 2 diabetes mellitus without complications: Secondary | ICD-10-CM | POA: Insufficient documentation

## 2014-08-26 ENCOUNTER — Other Ambulatory Visit (INDEPENDENT_AMBULATORY_CARE_PROVIDER_SITE_OTHER): Payer: Self-pay | Admitting: Internal Medicine

## 2014-11-28 ENCOUNTER — Observation Stay (HOSPITAL_COMMUNITY): Payer: 59

## 2014-11-28 ENCOUNTER — Encounter (HOSPITAL_COMMUNITY): Payer: Self-pay

## 2014-11-28 ENCOUNTER — Inpatient Hospital Stay (HOSPITAL_COMMUNITY)
Admission: EM | Admit: 2014-11-28 | Discharge: 2014-12-03 | DRG: 039 | Disposition: A | Discharge status: Home / Self Care | Payer: 59 | Attending: Internal Medicine | Admitting: Internal Medicine

## 2014-11-28 ENCOUNTER — Emergency Department (HOSPITAL_COMMUNITY): Payer: 59

## 2014-11-28 DIAGNOSIS — F17210 Nicotine dependence, cigarettes, uncomplicated: Secondary | ICD-10-CM | POA: Diagnosis present

## 2014-11-28 DIAGNOSIS — Z720 Tobacco use: Secondary | ICD-10-CM | POA: Diagnosis not present

## 2014-11-28 DIAGNOSIS — K210 Gastro-esophageal reflux disease with esophagitis: Secondary | ICD-10-CM | POA: Diagnosis present

## 2014-11-28 DIAGNOSIS — F17200 Nicotine dependence, unspecified, uncomplicated: Secondary | ICD-10-CM

## 2014-11-28 DIAGNOSIS — I10 Essential (primary) hypertension: Secondary | ICD-10-CM

## 2014-11-28 DIAGNOSIS — I517 Cardiomegaly: Secondary | ICD-10-CM | POA: Diagnosis present

## 2014-11-28 DIAGNOSIS — E781 Pure hyperglyceridemia: Secondary | ICD-10-CM | POA: Diagnosis present

## 2014-11-28 DIAGNOSIS — I6523 Occlusion and stenosis of bilateral carotid arteries: Secondary | ICD-10-CM | POA: Diagnosis not present

## 2014-11-28 DIAGNOSIS — Z7982 Long term (current) use of aspirin: Secondary | ICD-10-CM

## 2014-11-28 DIAGNOSIS — I639 Cerebral infarction, unspecified: Secondary | ICD-10-CM

## 2014-11-28 DIAGNOSIS — E1165 Type 2 diabetes mellitus with hyperglycemia: Secondary | ICD-10-CM | POA: Diagnosis present

## 2014-11-28 DIAGNOSIS — I739 Peripheral vascular disease, unspecified: Secondary | ICD-10-CM

## 2014-11-28 DIAGNOSIS — R4701 Aphasia: Secondary | ICD-10-CM | POA: Diagnosis present

## 2014-11-28 DIAGNOSIS — E119 Type 2 diabetes mellitus without complications: Secondary | ICD-10-CM

## 2014-11-28 DIAGNOSIS — E7800 Pure hypercholesterolemia, unspecified: Secondary | ICD-10-CM

## 2014-11-28 DIAGNOSIS — E1149 Type 2 diabetes mellitus with other diabetic neurological complication: Secondary | ICD-10-CM

## 2014-11-28 DIAGNOSIS — K219 Gastro-esophageal reflux disease without esophagitis: Secondary | ICD-10-CM

## 2014-11-28 DIAGNOSIS — G459 Transient cerebral ischemic attack, unspecified: Secondary | ICD-10-CM | POA: Diagnosis not present

## 2014-11-28 DIAGNOSIS — E669 Obesity, unspecified: Secondary | ICD-10-CM | POA: Diagnosis present

## 2014-11-28 DIAGNOSIS — I779 Disorder of arteries and arterioles, unspecified: Secondary | ICD-10-CM

## 2014-11-28 DIAGNOSIS — R531 Weakness: Secondary | ICD-10-CM | POA: Diagnosis not present

## 2014-11-28 DIAGNOSIS — Z6825 Body mass index (BMI) 25.0-25.9, adult: Secondary | ICD-10-CM

## 2014-11-28 DIAGNOSIS — Z23 Encounter for immunization: Secondary | ICD-10-CM

## 2014-11-28 HISTORY — DX: Transient cerebral ischemic attack, unspecified: G45.9

## 2014-11-28 LAB — APTT: aPTT: 34 seconds (ref 24–37)

## 2014-11-28 LAB — RAPID URINE DRUG SCREEN, HOSP PERFORMED
Amphetamines: NOT DETECTED
BARBITURATES: NOT DETECTED
Benzodiazepines: POSITIVE — AB
COCAINE: NOT DETECTED
Opiates: NOT DETECTED
Tetrahydrocannabinol: NOT DETECTED

## 2014-11-28 LAB — GLUCOSE, CAPILLARY
GLUCOSE-CAPILLARY: 190 mg/dL — AB (ref 65–99)
Glucose-Capillary: 165 mg/dL — ABNORMAL HIGH (ref 65–99)

## 2014-11-28 LAB — URINALYSIS, ROUTINE W REFLEX MICROSCOPIC
Bilirubin Urine: NEGATIVE
Glucose, UA: 250 mg/dL — AB
KETONES UR: NEGATIVE mg/dL
LEUKOCYTES UA: NEGATIVE
NITRITE: NEGATIVE
Protein, ur: NEGATIVE mg/dL
Specific Gravity, Urine: 1.02 (ref 1.005–1.030)
Urobilinogen, UA: 0.2 mg/dL (ref 0.0–1.0)
pH: 6 (ref 5.0–8.0)

## 2014-11-28 LAB — URINE MICROSCOPIC-ADD ON

## 2014-11-28 LAB — COMPREHENSIVE METABOLIC PANEL
ALT: 18 U/L (ref 14–54)
AST: 20 U/L (ref 15–41)
Albumin: 4.5 g/dL (ref 3.5–5.0)
Alkaline Phosphatase: 64 U/L (ref 38–126)
Anion gap: 11 (ref 5–15)
BUN: 24 mg/dL — ABNORMAL HIGH (ref 6–20)
CO2: 22 mmol/L (ref 22–32)
CREATININE: 0.9 mg/dL (ref 0.44–1.00)
Calcium: 9.1 mg/dL (ref 8.9–10.3)
Chloride: 103 mmol/L (ref 101–111)
GFR calc Af Amer: >60 mL/min (ref >60)
Glucose, Bld: 259 mg/dL — ABNORMAL HIGH (ref 65–99)
POTASSIUM: 4.6 mmol/L (ref 3.5–5.1)
Sodium: 136 mmol/L (ref 135–145)
TOTAL PROTEIN: 7.7 g/dL (ref 6.5–8.1)
Total Bilirubin: 0.5 mg/dL (ref 0.3–1.2)

## 2014-11-28 LAB — ETHANOL

## 2014-11-28 LAB — CBC
HEMATOCRIT: 41.8 % (ref 36.0–46.0)
HEMOGLOBIN: 14.1 g/dL (ref 12.0–15.0)
MCH: 30.9 pg (ref 26.0–34.0)
MCHC: 33.7 g/dL (ref 30.0–36.0)
MCV: 91.5 fL (ref 78.0–100.0)
Platelets: 256 10*3/uL (ref 150–400)
RBC: 4.57 MIL/uL (ref 3.87–5.11)
RDW: 13.7 % (ref 11.5–15.5)
WBC: 12.9 10*3/uL — ABNORMAL HIGH (ref 4.0–10.5)

## 2014-11-28 LAB — DIFFERENTIAL
Basophils Absolute: 0 10*3/uL (ref 0.0–0.1)
Basophils Relative: 0 %
EOS ABS: 0.3 10*3/uL (ref 0.0–0.7)
Eosinophils Relative: 2 %
Lymphocytes Relative: 29 %
Lymphs Abs: 3.7 10*3/uL (ref 0.7–4.0)
MONO ABS: 0.7 10*3/uL (ref 0.1–1.0)
MONOS PCT: 5 %
Neutro Abs: 8.2 10*3/uL — ABNORMAL HIGH (ref 1.7–7.7)
Neutrophils Relative %: 64 %

## 2014-11-28 LAB — I-STAT CHEM 8, ED
BUN: 25 mg/dL — ABNORMAL HIGH (ref 6–20)
CALCIUM ION: 1.18 mmol/L (ref 1.12–1.23)
CREATININE: 0.8 mg/dL (ref 0.44–1.00)
Chloride: 104 mmol/L (ref 101–111)
Glucose, Bld: 261 mg/dL — ABNORMAL HIGH (ref 65–99)
HCT: 46 % (ref 36.0–46.0)
HEMOGLOBIN: 15.6 g/dL — AB (ref 12.0–15.0)
Potassium: 4.7 mmol/L (ref 3.5–5.1)
Sodium: 137 mmol/L (ref 135–145)
TCO2: 21 mmol/L (ref 0–100)

## 2014-11-28 LAB — I-STAT TROPONIN, ED: TROPONIN I, POC: 0 ng/mL (ref 0.00–0.08)

## 2014-11-28 LAB — PROTIME-INR
INR: 0.99 (ref 0.00–1.49)
Prothrombin Time: 13.3 seconds (ref 11.6–15.2)

## 2014-11-28 MED ORDER — ALPRAZOLAM 0.5 MG PO TABS
0.5000 mg | ORAL_TABLET | Freq: Two times a day (BID) | ORAL | Status: DC
  Administered 2014-11-29 – 2014-12-03 (×7): 0.5 mg via ORAL
  Filled 2014-11-28 (×8): qty 1

## 2014-11-28 MED ORDER — ASPIRIN EC 81 MG PO TBEC
81.0000 mg | DELAYED_RELEASE_TABLET | Freq: Every day | ORAL | Status: DC
  Administered 2014-11-28 – 2014-11-29 (×2): 81 mg via ORAL
  Filled 2014-11-28 (×2): qty 1

## 2014-11-28 MED ORDER — DICYCLOMINE HCL 10 MG PO CAPS
10.0000 mg | ORAL_CAPSULE | Freq: Every day | ORAL | Status: DC | PRN
  Administered 2014-11-29: 10 mg via ORAL
  Filled 2014-11-28 (×2): qty 1

## 2014-11-28 MED ORDER — STROKE: EARLY STAGES OF RECOVERY BOOK
Freq: Once | Status: AC
  Administered 2014-11-30: 22:00:00
  Filled 2014-11-28: qty 1

## 2014-11-28 MED ORDER — PANTOPRAZOLE SODIUM 40 MG PO TBEC
80.0000 mg | DELAYED_RELEASE_TABLET | Freq: Every day | ORAL | Status: DC
  Administered 2014-11-28 – 2014-12-03 (×5): 80 mg via ORAL
  Filled 2014-11-28 (×5): qty 2

## 2014-11-28 MED ORDER — ENOXAPARIN SODIUM 40 MG/0.4ML ~~LOC~~ SOLN
40.0000 mg | SUBCUTANEOUS | Status: DC
  Administered 2014-11-28 – 2014-12-02 (×5): 40 mg via SUBCUTANEOUS
  Filled 2014-11-28 (×6): qty 0.4

## 2014-11-28 MED ORDER — ONDANSETRON HCL 4 MG/2ML IJ SOLN: 4.0000 mg | Freq: Three times a day (TID) | INTRAMUSCULAR | Status: DC | PRN

## 2014-11-28 MED ORDER — ACETAMINOPHEN 650 MG RE SUPP: 650.0000 mg | RECTAL | Status: DC | PRN

## 2014-11-28 MED ORDER — ACETAMINOPHEN 325 MG PO TABS
650.0000 mg | ORAL_TABLET | ORAL | Status: DC | PRN
  Administered 2014-11-30 – 2014-12-03 (×3): 650 mg via ORAL
  Filled 2014-11-28 (×3): qty 2

## 2014-11-28 MED ORDER — INSULIN ASPART 100 UNIT/ML ~~LOC~~ SOLN
0.0000 [IU] | Freq: Three times a day (TID) | SUBCUTANEOUS | Status: DC
  Administered 2014-11-29: 5 [IU] via SUBCUTANEOUS
  Administered 2014-11-29: 1 [IU] via SUBCUTANEOUS
  Administered 2014-11-29: 2 [IU] via SUBCUTANEOUS
  Administered 2014-11-30 (×3): 3 [IU] via SUBCUTANEOUS
  Administered 2014-12-01: 2 [IU] via SUBCUTANEOUS
  Administered 2014-12-01: 5 [IU] via SUBCUTANEOUS
  Administered 2014-12-01: 3 [IU] via SUBCUTANEOUS
  Administered 2014-12-02: 8 [IU] via SUBCUTANEOUS
  Administered 2014-12-02: 4 [IU] via SUBCUTANEOUS
  Administered 2014-12-02 – 2014-12-03 (×2): 3 [IU] via SUBCUTANEOUS

## 2014-11-28 MED ORDER — NICOTINE 21 MG/24HR TD PT24
21.0000 mg | MEDICATED_PATCH | Freq: Every day | TRANSDERMAL | Status: DC
  Administered 2014-11-28 – 2014-12-03 (×5): 21 mg via TRANSDERMAL
  Filled 2014-11-28 (×5): qty 1

## 2014-11-28 MED ORDER — GABAPENTIN 300 MG PO CAPS
300.0000 mg | ORAL_CAPSULE | Freq: Three times a day (TID) | ORAL | Status: DC
  Administered 2014-11-28 – 2014-12-03 (×13): 300 mg via ORAL
  Filled 2014-11-28 (×5): qty 1
  Filled 2014-11-28: qty 3
  Filled 2014-11-28 (×9): qty 1

## 2014-11-28 MED ORDER — INFLUENZA VAC SPLIT QUAD 0.5 ML IM SUSY
0.5000 mL | PREFILLED_SYRINGE | INTRAMUSCULAR | Status: AC
  Administered 2014-11-29: 0.5 mL via INTRAMUSCULAR

## 2014-11-28 MED ORDER — PNEUMOCOCCAL VAC POLYVALENT 25 MCG/0.5ML IJ INJ
0.5000 mL | INJECTION | INTRAMUSCULAR | Status: AC
  Administered 2014-11-29: 0.5 mL via INTRAMUSCULAR
  Filled 2014-11-28: qty 0.5

## 2014-11-28 NOTE — ED Provider Notes (Signed)
CSN: [id number]     Arrival date & time 11/28/14  1041 History  This chart was scribed for Noemi Chapel, MD by Irene Pap, ED Scribe. This patient was seen in room APA06/APA06 and patient care was started at 12:34 PM.    Chief Complaint  Patient presents with  . Weakness   The history is provided by the patient. No language interpreter was used.   HPI Comments: Grace Hess is a 59 y.o. Female with hx of DM, enlarged heart, palpitations, hypercholesteremia who presents to the Emergency Department complaining of numbness onset one day ago. Pt states that she was leaving [place] and her eye began to bother her with triple vision around noon. She states that she was driving when she suddenly was not able to form sentences for 5 minutes. She states that she then felt numbness in her right hand and left arm. She reports that she could not remember driving for 12 miles. States that the speech trouble has since resolved but still feels residual numbness in her right arm. Reports hx of numbness and weakness in right arm 7 weeks ago lasting 30 minutes, and has been having fluctuating weakness and numbness that seems to be right-sided. Pt denies abnormal gait. Pt denies hx of HTN. LSN 24 hours ago.   Past Medical History  Diagnosis Date  . Diabetes mellitus   . Hypercholesteremia   . Obesity   . Chest pain, unspecified   . Reflux esophagitis   . Cervicalgia    Past Surgical History  Procedure Laterality Date  . Cholecystectomy    . Partial hysterectomy     No family history on file. Social History  Substance Use Topics  . Smoking status: Current Every Day Smoker  . Smokeless tobacco: None     Comment: 1 1/2 pack a day. Smoking since age 16  . Alcohol Use: No   OB History    No data available     Review of Systems  Eyes: Positive for visual disturbance.  Musculoskeletal: Negative for gait problem.  Neurological: Positive for speech difficulty and numbness.  All other  systems reviewed and are negative.  Allergies  Codeine and Sulfa antibiotics  Home Medications   Prior to Admission medications   Medication Sig Start Date End Date Taking? Authorizing Provider  ALPRAZolam Duanne Moron) 1 MG tablet Take 0.5 mg by mouth 2 (two) times daily.    Yes Historical Provider, MD  aspirin EC 81 MG tablet Take 81 mg by mouth daily.     Yes Historical Provider, MD  dexlansoprazole (DEXILANT) 60 MG capsule Take 60 mg by mouth daily as needed (acid reflux).   Yes Historical Provider, MD  dicyclomine (BENTYL) 10 MG capsule Take 1 capsule (10 mg total) by mouth 4 (four) times daily -  before meals and at bedtime. Take 1 capsule (10 mg total) by mouth 3 (three) times daily - before meals Patient taking differently: Take 10 mg by mouth daily as needed for spasms. Take 1 capsule (10 mg total) by mouth 3 (three) times daily - before meals 07/17/13  Yes Rogene Houston, MD  gabapentin (NEURONTIN) 300 MG capsule Take 300 mg by mouth 3 (three) times daily.   Yes Historical Provider, MD  metFORMIN (GLUCOPHAGE) 500 MG tablet Take 500 mg by mouth 2 (two) times daily with a meal.     Yes Historical Provider, MD  oxyCODONE (OXY IR/ROXICODONE) 5 MG immediate release tablet Take 5 mg by mouth every 4 (four)  hours as needed for moderate pain.  04/08/12  Yes Historical Provider, MD  colesevelam (WELCHOL) 625 MG tablet Take 3,750 mg by mouth daily.    Historical Provider, MD  DEXILANT 60 MG capsule take 1 capsule by mouth every morning Patient not taking: Reported on 11/28/2014 08/26/14   Rogene Houston, MD  lisinopril (PRINIVIL,ZESTRIL) 20 MG tablet Take 20 mg by mouth daily.    Historical Provider, MD  phenazopyridine (PYRIDIUM) 200 MG tablet Take 1 tablet (200 mg total) by mouth 3 (three) times daily as needed for pain. Patient not taking: Reported on 11/28/2014 10/20/13   Veryl Speak, MD   BP 154/62 mmHg  Pulse 89  Temp(Src) 98.3 F (36.8 C) (Oral)  Resp 18  Ht 5\' 6"  (1.676 m)  Wt 156 lb  (70.761 kg)  BMI 25.19 kg/m2  SpO2 96%  Physical Exam  Constitutional: She appears well-developed and well-nourished. No distress.  HENT:  Head: Normocephalic and atraumatic.  Mouth/Throat: Oropharynx is clear and moist. No oropharyngeal exudate.  Eyes: Conjunctivae and EOM are normal. Pupils are equal, round, and reactive to light. Right eye exhibits no discharge. Left eye exhibits no discharge. No scleral icterus.  Neck: Normal range of motion. Neck supple. No JVD present. No thyromegaly present.  Cardiovascular: Normal rate, regular rhythm, normal heart sounds and intact distal pulses.  Exam reveals no gallop and no friction rub.   No murmur heard. Pulmonary/Chest: Effort normal and breath sounds normal. No respiratory distress. She has no wheezes. She has no rales.  Abdominal: Soft. Bowel sounds are normal. She exhibits no distension and no mass. There is no tenderness.  Musculoskeletal: Normal range of motion. She exhibits no edema or tenderness.  Lymphadenopathy:    She has no cervical adenopathy.  Neurological: She is alert. Coordination normal.  Neurologic exam:  Speech clear, pupils equal round reactive to light, extraocular movements intact  Normal peripheral visual fields Cranial nerves III through XII normal including no facial droop Follows commands, moves all extremities x4, normal strength to bilateral upper and lower extremities at all major muscle groups including grip Sensation normal to light touch and pinprick except as below Coordination intact, no limb ataxia, finger-nose-finger normal Rapid alternating movements normal No pronator drift Gait normal But does have numbness RUE at the hand  Skin: Skin is warm and dry. No rash noted. No erythema.  Psychiatric: She has a normal mood and affect. Her behavior is normal.  Nursing note and vitals reviewed.   ED Course  Procedures (including critical care time) DIAGNOSTIC STUDIES: Oxygen Saturation is 96% on RA,  normal by my interpretation.    COORDINATION OF CARE: 12:41 PM-Discussed treatment plan which includes labs and CT scan with pt at bedside and pt agreed to plan.   Labs Review Labs Reviewed  CBC - Abnormal; Notable for the following:    WBC 12.9 (*)    All other components within normal limits  DIFFERENTIAL - Abnormal; Notable for the following:    Neutro Abs 8.2 (*)    All other components within normal limits  COMPREHENSIVE METABOLIC PANEL - Abnormal; Notable for the following:    Glucose, Bld 259 (*)    BUN 24 (*)    All other components within normal limits  URINE RAPID DRUG SCREEN, HOSP PERFORMED - Abnormal; Notable for the following:    Benzodiazepines POSITIVE (*)    All other components within normal limits  I-STAT CHEM 8, ED - Abnormal; Notable for the following:  BUN 25 (*)    Glucose, Bld 261 (*)    Hemoglobin 15.6 (*)    All other components within normal limits  ETHANOL  PROTIME-INR  APTT  URINALYSIS, ROUTINE W REFLEX MICROSCOPIC (NOT AT [facility])  I-STAT TROPOININ, ED    Imaging Review Ct Head Wo Contrast  11/28/2014   CLINICAL DATA:  Weakness right hand, slurred speech  EXAM: CT HEAD WITHOUT CONTRAST  TECHNIQUE: Contiguous axial images were obtained from the base of the skull through the vertex without intravenous contrast.  COMPARISON:  03/18/2011  FINDINGS: No skull fracture is noted. Paranasal sinuses and mastoid air cells are unremarkable.  No intracranial hemorrhage, mass effect or midline shift. No definite acute cortical infarction. Stable left periventricular white matter decreased attenuation adjacent to frontal horn. Stable small lacunar infarct in left basal ganglia.  No mass lesion is noted on this unenhanced scan. There is no hydrocephalus.  IMPRESSION: No acute intracranial abnormality. No definite acute cortical infarction. Stable chronic findings as described above.   Electronically Signed   By: Lahoma Crocker M.D.   On: 11/28/2014 13:39      EKG  Interpretation   Date/Time:  Thursday November 28 2014 12:46:13 EDT Ventricular Rate:  94 PR Interval:  153 QRS Duration: 79 QT Interval:  348 QTC Calculation: 435 R Axis:   34 Text Interpretation:  Sinus rhythm Low voltage, precordial leads Probable  anteroseptal infarct, old Baseline wander in lead(s) V1 Since last tracing  rate faster Confirmed by Tabbatha Bordelon  MD, Desmin Daleo ([id number]) on 11/28/2014 1:23:13 PM      MDM   Final diagnoses:  Transient cerebral ischemia, unspecified transient cerebral ischemia type    CT scan of labs are unremarkable, EKG shows no A. fib, vital signs show mild hypertension, discussed with the hospitalist, Dr. Sarajane Jews who will admit to the hospital. Holding orders requested. The patient has been able to ambulate without difficulty while here.   I personally performed the services described in this documentation, which was scribed in my presence. The recorded information has been reviewed and is accurate.       Noemi Chapel, MD 11/28/14 [phone]

## 2014-11-28 NOTE — ED Notes (Signed)
Patient states that she was driving back from [place] yesterday, was talking to her niece, speech was fine and then all of a sudden she was unable to form sentences, this lasted for 4-5 minutes. Patient also reports that she also experienced numbness in her right hand and that she had triple vision yesterday that lasted for about 2 or 3 hours.

## 2014-11-28 NOTE — H&P (Signed)
History and Physical  ATARI EDLEY [id number] DOB: 03-Jan-1956 DOA: 11/28/2014  Referring physician: Noemi Chapel, MD PCP: Alonza Bogus, MD   Chief Complaint: Right hand numbness  HPI:  49 yow presented with hx of aphasia and right hand numbness on 9/14. Initial evaluation included  CT head which was unremakable and she was referred for observation and stroke workup.  Was driving home yesterday afternoon when right hand went numb, lasted 45 minutes and resolved spontaneously. Had difficulty speaking, expressive aphasia same time lasting 5 minutes which resolved spontaneously. Didn't remember several miles of the trip and so pulled over and let niece drive home.  This am had recurrent, intermittent hand and wrist numbness so came to ED. No aphasia. No leg weakness or falls. Reports several episodes of right arm sudden weakness over last several months, transient in nature. Reports "triple" vision for the last week.    In the emergency department Afebrile, VSS Pertinent labs:CMP unremarkable, WBC 12.9, troponin negative EKG: Independently reviewed. SR, no acute changes Imaging: CT head no acute infarct  Review of Systems:  Positive for "triple" vision for about a week.   Negative for fever, visual changes, sore throat, rash, new muscle aches, chest pain, SOB, dysuria, bleeding, n/v/abdominal pain.  Past Medical History  Diagnosis Date  . Diabetes mellitus   . Hypercholesteremia   . Obesity   . Chest pain, unspecified   . Reflux esophagitis   . Cervicalgia     Past Surgical History  Procedure Laterality Date  . Cholecystectomy    . Partial hysterectomy      Social History:  reports that she has been smoking.  She does not have any smokeless tobacco history on file. She reports that she does not drink alcohol or use illicit drugs. lives with their spouse Self-care  Allergies  Allergen Reactions  . Codeine Shortness Of Breath  . Sulfa Antibiotics     Childhood  allergy    Family History  Problem Relation Age of Onset  . COPD Father   . Congestive Heart Failure Mother   . Cancer Brother      Prior to Admission medications   Medication Sig Start Date End Date Taking? Authorizing Provider  ALPRAZolam Duanne Moron) 1 MG tablet Take 0.5 mg by mouth 2 (two) times daily.    Yes Historical Provider, MD  aspirin EC 81 MG tablet Take 81 mg by mouth daily.     Yes Historical Provider, MD  dexlansoprazole (DEXILANT) 60 MG capsule Take 60 mg by mouth daily as needed (acid reflux).   Yes Historical Provider, MD  dicyclomine (BENTYL) 10 MG capsule Take 1 capsule (10 mg total) by mouth 4 (four) times daily -  before meals and at bedtime. Take 1 capsule (10 mg total) by mouth 3 (three) times daily - before meals Patient taking differently: Take 10 mg by mouth daily as needed for spasms. Take 1 capsule (10 mg total) by mouth 3 (three) times daily - before meals 07/17/13  Yes Rogene Houston, MD  gabapentin (NEURONTIN) 300 MG capsule Take 300 mg by mouth 3 (three) times daily.   Yes Historical Provider, MD  metFORMIN (GLUCOPHAGE) 500 MG tablet Take 500 mg by mouth 2 (two) times daily with a meal.     Yes Historical Provider, MD  oxyCODONE (OXY IR/ROXICODONE) 5 MG immediate release tablet Take 5 mg by mouth every 4 (four) hours as needed for moderate pain.  04/08/12  Yes Historical Provider, MD  colesevelam (WELCHOL) 625 MG  tablet Take 3,750 mg by mouth daily.    Historical Provider, MD  DEXILANT 60 MG capsule take 1 capsule by mouth every morning Patient not taking: Reported on 11/28/2014 08/26/14   Rogene Houston, MD  lisinopril (PRINIVIL,ZESTRIL) 20 MG tablet Take 20 mg by mouth daily.    Historical Provider, MD  phenazopyridine (PYRIDIUM) 200 MG tablet Take 1 tablet (200 mg total) by mouth 3 (three) times daily as needed for pain. Patient not taking: Reported on 11/28/2014 10/20/13   Veryl Speak, MD   Physical Exam: Filed Vitals:   11/28/14 1046 11/28/14 1230 11/28/14  1300  BP: 194/97 154/62 160/69  Pulse: 109 89 80  Temp: 98.3 F (36.8 C)    TempSrc: Oral    Resp: 20 18 21   Height: 5\' 6"  (1.676 m)    Weight: 70.761 kg (156 lb)    SpO2: 98% 96% 95%     General:  Appears calm and comfortable Eyes: PERRL, normal lids, irises & conjunctiva ENT: grossly normal hearing, lips & tongue Neck: no LAD, masses or thyromegaly Cardiovascular: RRR, no m/r/g. No LE edema. Respiratory: CTA bilaterally, no w/r/r. Normal respiratory effort. Abdomen: soft, ntnd Skin: no rash or induration seen on limited exam Musculoskeletal: grossly normal tone BUE/BLE, strength 5/5 BUE and BLE Psychiatric: grossly normal mood and affect, speech fluent and appropriate Neurologic: CN 2-12 intact, no pronator drift, negative Romberg, normal finger to nose bilaterally  Wt Readings from Last 3 Encounters:  11/28/14 70.761 kg (156 lb)  10/20/13 72.576 kg (160 lb)  12/28/11 74.617 kg (164 lb 8 oz)    Labs on Admission:  Basic Metabolic Panel:  Recent Labs Lab 11/28/14 1240 11/28/14 1253  NA 136 137  K 4.6 4.7  CL 103 104  CO2 22  --   GLUCOSE 259* 261*  BUN 24* 25*  CREATININE 0.90 0.80  CALCIUM 9.1  --     Liver Function Tests:  Recent Labs Lab 11/28/14 1240  AST 20  ALT 18  ALKPHOS 64  BILITOT 0.5  PROT 7.7  ALBUMIN 4.5    CBC:  Recent Labs Lab 11/28/14 1240 11/28/14 1253  WBC 12.9*  --   NEUTROABS 8.2*  --   HGB 14.1 15.6*  HCT 41.8 46.0  MCV 91.5  --   PLT 256  --       Recent Labs  11/28/14 1251  TROPIPOC 0.00     Radiological Exams on Admission: Ct Head Wo Contrast  11/28/2014   CLINICAL DATA:  Weakness right hand, slurred speech  EXAM: CT HEAD WITHOUT CONTRAST  TECHNIQUE: Contiguous axial images were obtained from the base of the skull through the vertex without intravenous contrast.  COMPARISON:  03/18/2011  FINDINGS: No skull fracture is noted. Paranasal sinuses and mastoid air cells are unremarkable.  No intracranial hemorrhage,  mass effect or midline shift. No definite acute cortical infarction. Stable left periventricular white matter decreased attenuation adjacent to frontal horn. Stable small lacunar infarct in left basal ganglia.  No mass lesion is noted on this unenhanced scan. There is no hydrocephalus.  IMPRESSION: No acute intracranial abnormality. No definite acute cortical infarction. Stable chronic findings as described above.   Electronically Signed   By: Lahoma Crocker M.D.   On: 11/28/2014 13:39      Principal Problem:   TIA (transient ischemic attack) Active Problems:   DM type 2 (diabetes mellitus, type 2)   Tobacco use disorder   Assessment/Plan 1. Right hand numbness, expressive aphasia,  resolved prior to ED. Consider TIA vs CVA. Not a candidate for tPA secondary to delay in presentation, last seen normal 9/14. Also consider MS given recurrent episodes of RUE weakness. 2. DM type 2,  Stable. Hold metformin. 3. Tobacco use disorder. Recommended cessation   Observation  Tele, stroke evaluation including MRI brain, carotid, echo  PT/OT evaluation   SSI  Nicotine patch  Code Status: Full  DVT prophylaxis:Lovenox Family Communication: sister at bedside Disposition Plan/Anticipated LOS: Admit 2-3 days   Time spent: 2 minutes  Murray Hodgkins, MD  [facility] Pager [phone] 11/28/2014, 2:22 PM    By signing my name below, I, Rennis Harding attest that this documentation has been prepared under the direction and in the presence of Murray Hodgkins, MD Electronically signed: Rennis Harding  11/28/2014   I have reviewed the above documentation for accuracy and completeness, and I agree with the above. Murray Hodgkins, MD

## 2014-11-28 NOTE — ED Notes (Addendum)
Pt reports yesterday her R.arm went numb and her speech was jumbled. States she sees triple and auras in her L.eye. States her arm is still numb and has general weakness. Thinks shes been under a lot of stress and has been having headaches and feels its related to sinuses.

## 2014-11-29 ENCOUNTER — Observation Stay (HOSPITAL_BASED_OUTPATIENT_CLINIC_OR_DEPARTMENT_OTHER): Payer: 59

## 2014-11-29 ENCOUNTER — Observation Stay (HOSPITAL_COMMUNITY): Payer: 59

## 2014-11-29 DIAGNOSIS — G459 Transient cerebral ischemic attack, unspecified: Secondary | ICD-10-CM

## 2014-11-29 LAB — GLUCOSE, CAPILLARY
GLUCOSE-CAPILLARY: 327 mg/dL — AB (ref 65–99)
Glucose-Capillary: 192 mg/dL — ABNORMAL HIGH (ref 65–99)
Glucose-Capillary: 298 mg/dL — ABNORMAL HIGH (ref 65–99)
Glucose-Capillary: 128 mg/dL — ABNORMAL HIGH (ref 65–99)

## 2014-11-29 LAB — LIPID PANEL
CHOL/HDL RATIO: 10.1 ratio
Cholesterol: 303 mg/dL — ABNORMAL HIGH (ref 0–200)
HDL: 30 mg/dL — AB (ref >40)
LDL CALC: UNDETERMINED mg/dL (ref 0–99)
TRIGLYCERIDES: 967 mg/dL — AB (ref <150)
VLDL: UNDETERMINED mg/dL (ref 0–40)

## 2014-11-29 MED ORDER — ATORVASTATIN CALCIUM 80 MG PO TABS
80.0000 mg | ORAL_TABLET | Freq: Every day | ORAL | Status: DC
  Administered 2014-11-29 – 2014-12-02 (×4): 80 mg via ORAL
  Filled 2014-11-29 (×2): qty 2
  Filled 2014-11-29 (×3): qty 1

## 2014-11-29 MED ORDER — CLOPIDOGREL BISULFATE 75 MG PO TABS
75.0000 mg | ORAL_TABLET | Freq: Every day | ORAL | Status: DC
  Administered 2014-11-29 – 2014-12-03 (×4): 75 mg via ORAL
  Filled 2014-11-29 (×4): qty 1

## 2014-11-29 MED ORDER — ASPIRIN EC 325 MG PO TBEC
325.0000 mg | DELAYED_RELEASE_TABLET | Freq: Every day | ORAL | Status: DC
  Administered 2014-11-30 – 2014-12-03 (×3): 325 mg via ORAL
  Filled 2014-11-29 (×3): qty 1

## 2014-11-29 NOTE — Care Management Note (Signed)
Case Management Note  Patient Details  Name: Grace Hess MRN: [id number] Date of Birth: 1955/11/10  Subjective/Objective:                  Pt admitted from home with TIA. Pt lives with her husband and will return home at discharge. Pt is independent with ADL's.  Action/Plan: No Cm needs noted.  Expected Discharge Date:  12/01/14               Expected Discharge Plan:  Home/Self Care  In-House Referral:  NA  Discharge planning Services  CM Consult  Post Acute Care Choice:  NA Choice offered to:  NA  DME Arranged:    DME Agency:     HH Arranged:    HH Agency:     Status of Service:  Completed, signed off  Medicare Important Message Given:    Date Medicare IM Given:    Medicare IM give by:    Date Additional Medicare IM Given:    Additional Medicare Important Message give by:     If discussed at [place] of Stay Meetings, dates discussed:    Additional Comments:  Joylene Draft, RN 11/29/2014, 12:11 PM

## 2014-11-29 NOTE — Evaluation (Signed)
Occupational Therapy Evaluation Patient Details Name: Grace Hess MRN: [id number] DOB: May 10, 1955 Today's Date: 11/29/2014    History of Present Illness 59 y/o presented with hx of aphasia and right hand numbness on 9/14. Initial evaluation included CT head which was unremakable and she was referred for observation and stroke workup. Was driving home yesterday afternoon when right hand went numb, lasted 45 minutes and resolved spontaneously. Had difficulty speaking, expressive aphasia same time lasting 5 minutes which resolved spontaneously. Didn't remember several miles of the trip and so pulled over and let niece drive home. This am had recurrent, intermittent hand and wrist numbness so came to ED. No aphasia. No leg weakness or falls. Reports several episodes of right arm sudden weakness over last several months, transient in nature. Reports "triple" vision for the last week.    Clinical Impression   Pt awake, alert, and oriented this am. Pt reports symptoms have resolved this am, however have been intermittent over last few weeks. Pt reports "triple" vision sets in around lunchtime every day and resolves mid-afternoon; pt reports she has eye appointment at end of September. Pt demonstrates BUE range of motion WNL, strength 4+/5. Pt is independent in ADL tasks. No further OT needs at this time.     Follow Up Recommendations  No OT follow up    Equipment Recommendations  None recommended by OT       Precautions / Restrictions Precautions Precautions: None      Mobility Bed Mobility Overal bed mobility: Independent                Transfers Overall transfer level: Independent                         ADL Overall ADL's : Independent;At baseline                     Lower Body Dressing: Independent                       Vision Vision Assessment?: Yes Eye Alignment: Within Functional Limits Ocular Range of Motion: Within Functional  Limits Alignment/Gaze Preference: Within Defined Limits Tracking/Visual Pursuits: Able to track stimulus in all quads without difficulty Saccades: Within functional limits Convergence: Within functional limits Visual Fields: No apparent deficits Additional Comments: Pt reports "triple vision in right eye. Disappears with right eye covered          Pertinent Vitals/Pain Pain Assessment: No/denies pain     Hand Dominance Right   Extremity/Trunk Assessment Upper Extremity Assessment Upper Extremity Assessment: Overall WFL for tasks assessed   Lower Extremity Assessment Lower Extremity Assessment: Defer to PT evaluation       Communication Communication Communication: No difficulties   Cognition Arousal/Alertness: Awake/alert Behavior During Therapy: WFL for tasks assessed/performed Overall Cognitive Status: Within Functional Limits for tasks assessed                                Home Living Family/patient expects to be discharged to:: Private residence Living Arrangements: Spouse/significant other Available Help at Discharge: Family Type of Home: House             Bathroom Shower/Tub: Teaching laboratory technician Toilet: Standard     Home Equipment: Environmental consultant - 2 wheels;Cane - single point;Shower seat          Prior Functioning/Environment Level of Independence:  Independent              End of Session    Activity Tolerance: Patient tolerated treatment well Patient left: in bed;with call bell/phone within reach   Time: [id number] OT Time Calculation (min): 18 min Charges:  OT General Charges $OT Visit: 1 Procedure OT Evaluation $Initial OT Evaluation Tier I: 1 Procedure G-Codes: OT G-codes **NOT FOR INPATIENT CLASS** Functional Assessment Tool Used: clinical judgement Functional Limitation: Self care Self Care Current Status [id number]): At least 1 percent but less than 20 percent impaired, limited or restricted Self Care Goal Status  [id number]): At least 1 percent but less than 20 percent impaired, limited or restricted Self Care Discharge Status [phone]): At least 1 percent but less than 20 percent impaired, limited or restricted  Guadelupe Sabin, OTR/L  [phone]  11/29/2014, 9:26 AM

## 2014-11-29 NOTE — Progress Notes (Signed)
PT Cancellation Note  Patient Details Name: Grace Hess MRN: [id number] DOB: Jun 15, 1955   Cancelled Treatment:    Reason Eval/Treat Not Completed: PT screened, no needs identified, will sign off   Sable Feil  PT 11/29/2014, 10:06 AM [phone]

## 2014-11-29 NOTE — Progress Notes (Signed)
Subjective: She was admitted with concern for stroke or TIA. She had MRI and MRA and she does not show a stroke but she does show what appears to be severe narrowing of the carotid arteries. She needs a Doppler study as well. She says she still has some right hand numbness but otherwise her speech is back to normal and she doesn't have any other symptoms  Objective: Vital signs in last 24 hours: Temp:  [97.8 F (36.6 C)-98.2 F (36.8 C)] 98.2 F (36.8 C) (09/16 0809) Pulse Rate:  [69-89] 82 (09/16 0809) Resp:  [14-21] 18 (09/16 0809) BP: (97-160)/(48-84) 132/82 mmHg (09/16 0809) SpO2:  [94 %-99 %] 98 % (09/16 0809) Weight:  [70.761 kg (156 lb)-71.033 kg (156 lb 9.6 oz)] 70.761 kg (156 lb) (09/15 1901) Weight change:  Last BM Date: 11/28/14  Intake/Output from previous day: 09/15 0701 - 09/16 0700 In: 240 [P.O.:240] Out: 400 [Urine:400]  PHYSICAL EXAM General appearance: alert, cooperative and She is neurologically intact except for complaints of numbness of her right hand Resp: clear to auscultation bilaterally Cardio: regular rate and rhythm, S1, S2 normal, no murmur, click, rub or gallop GI: soft, non-tender; bowel sounds normal; no masses,  no organomegaly Extremities: extremities normal, atraumatic, no cyanosis or edema  Lab Results:  Results for orders placed or performed during the hospital encounter of 11/28/14 (from the past 48 hour(s))  Ethanol     Status: None   Collection Time: 11/28/14 12:40 PM  Result Value Ref Range   Alcohol, Ethyl (B) <5 <5 mg/dL    Comment:        LOWEST DETECTABLE LIMIT FOR SERUM ALCOHOL IS 5 mg/dL FOR MEDICAL PURPOSES ONLY   Protime-INR     Status: None   Collection Time: 11/28/14 12:40 PM  Result Value Ref Range   Prothrombin Time 13.3 11.6 - 15.2 seconds   INR 0.99 0.00 - 1.49  APTT     Status: None   Collection Time: 11/28/14 12:40 PM  Result Value Ref Range   aPTT 34 24 - 37 seconds  CBC     Status: Abnormal   Collection Time:  11/28/14 12:40 PM  Result Value Ref Range   WBC 12.9 (H) 4.0 - 10.5 K/uL   RBC 4.57 3.87 - 5.11 MIL/uL   Hemoglobin 14.1 12.0 - 15.0 g/dL   HCT 41.8 36.0 - 46.0 %   MCV 91.5 78.0 - 100.0 fL   MCH 30.9 26.0 - 34.0 pg   MCHC 33.7 30.0 - 36.0 g/dL   RDW 13.7 11.5 - 15.5 %   Platelets 256 150 - 400 K/uL  Differential     Status: Abnormal   Collection Time: 11/28/14 12:40 PM  Result Value Ref Range   Neutrophils Relative % 64 %   Neutro Abs 8.2 (H) 1.7 - 7.7 K/uL   Lymphocytes Relative 29 %   Lymphs Abs 3.7 0.7 - 4.0 K/uL   Monocytes Relative 5 %   Monocytes Absolute 0.7 0.1 - 1.0 K/uL   Eosinophils Relative 2 %   Eosinophils Absolute 0.3 0.0 - 0.7 K/uL   Basophils Relative 0 %   Basophils Absolute 0.0 0.0 - 0.1 K/uL  Comprehensive metabolic panel     Status: Abnormal   Collection Time: 11/28/14 12:40 PM  Result Value Ref Range   Sodium 136 135 - 145 mmol/L   Potassium 4.6 3.5 - 5.1 mmol/L   Chloride 103 101 - 111 mmol/L   CO2 22 22 - 32 mmol/L  Glucose, Bld 259 (H) 65 - 99 mg/dL   BUN 24 (H) 6 - 20 mg/dL   Creatinine, Ser 0.90 0.44 - 1.00 mg/dL   Calcium 9.1 8.9 - 10.3 mg/dL   Total Protein 7.7 6.5 - 8.1 g/dL   Albumin 4.5 3.5 - 5.0 g/dL   AST 20 15 - 41 U/L   ALT 18 14 - 54 U/L   Alkaline Phosphatase 64 38 - 126 U/L   Total Bilirubin 0.5 0.3 - 1.2 mg/dL   GFR calc non Af Amer >60 >60 mL/min   GFR calc Af Amer >60 >60 mL/min    Comment: (NOTE) The eGFR has been calculated using the CKD EPI equation. This calculation has not been validated in all clinical situations. eGFR's persistently <60 mL/min signify possible Chronic Kidney Disease.    Anion gap 11 5 - 15  I-stat troponin, ED (not at [facility], [facility])     Status: None   Collection Time: 11/28/14 12:51 PM  Result Value Ref Range   Troponin i, poc 0.00 0.00 - 0.08 ng/mL   Comment 3            Comment: Due to the release kinetics of cTnI, a negative result within the first hours of the onset of symptoms does not rule  out myocardial infarction with certainty. If myocardial infarction is still suspected, repeat the test at appropriate intervals.   I-Stat Chem 8, ED  (not at [facility], [facility])     Status: Abnormal   Collection Time: 11/28/14 12:53 PM  Result Value Ref Range   Sodium 137 135 - 145 mmol/L   Potassium 4.7 3.5 - 5.1 mmol/L   Chloride 104 101 - 111 mmol/L   BUN 25 (H) 6 - 20 mg/dL   Creatinine, Ser 0.80 0.44 - 1.00 mg/dL   Glucose, Bld 261 (H) 65 - 99 mg/dL   Calcium, Ion 1.18 1.12 - 1.23 mmol/L   TCO2 21 0 - 100 mmol/L   Hemoglobin 15.6 (H) 12.0 - 15.0 g/dL   HCT 46.0 36.0 - 46.0 %  Urine rapid drug screen (hosp performed)not at [facility]     Status: Abnormal   Collection Time: 11/28/14  1:00 PM  Result Value Ref Range   Opiates NONE DETECTED NONE DETECTED   Cocaine NONE DETECTED NONE DETECTED   Benzodiazepines POSITIVE (A) NONE DETECTED   Amphetamines NONE DETECTED NONE DETECTED   Tetrahydrocannabinol NONE DETECTED NONE DETECTED   Barbiturates NONE DETECTED NONE DETECTED    Comment:        DRUG SCREEN FOR MEDICAL PURPOSES ONLY.  IF CONFIRMATION IS NEEDED FOR ANY PURPOSE, NOTIFY LAB WITHIN 5 DAYS.        LOWEST DETECTABLE LIMITS FOR URINE DRUG SCREEN Drug Class       Cutoff (ng/mL) Amphetamine      1000 Barbiturate      200 Benzodiazepine   572 Tricyclics       620 Opiates          300 Cocaine          300 THC              50   Urinalysis, Routine w reflex microscopic (not at [facility])     Status: Abnormal   Collection Time: 11/28/14  1:08 PM  Result Value Ref Range   Color, Urine YELLOW YELLOW   APPearance CLEAR CLEAR   Specific Gravity, Urine 1.020 1.005 - 1.030   pH 6.0 5.0 - 8.0   Glucose,  UA 250 (A) NEGATIVE mg/dL   Hgb urine dipstick SMALL (A) NEGATIVE   Bilirubin Urine NEGATIVE NEGATIVE   Ketones, ur NEGATIVE NEGATIVE mg/dL   Protein, ur NEGATIVE NEGATIVE mg/dL   Urobilinogen, UA 0.2 0.0 - 1.0 mg/dL   Nitrite NEGATIVE NEGATIVE   Leukocytes, UA NEGATIVE NEGATIVE  Urine  microscopic-add on     Status: Abnormal   Collection Time: 11/28/14  1:08 PM  Result Value Ref Range   Squamous Epithelial / LPF MANY (A) RARE   WBC, UA 0-2 <3 WBC/hpf   RBC / HPF 0-2 <3 RBC/hpf   Bacteria, UA MANY (A) RARE  Glucose, capillary     Status: Abnormal   Collection Time: 11/28/14  4:21 PM  Result Value Ref Range   Glucose-Capillary 165 (H) 65 - 99 mg/dL   Comment 1 Notify RN    Comment 2 Document in Chart   Glucose, capillary     Status: Abnormal   Collection Time: 11/28/14  8:28 PM  Result Value Ref Range   Glucose-Capillary 190 (H) 65 - 99 mg/dL  Lipid panel     Status: Abnormal   Collection Time: 11/29/14  6:30 AM  Result Value Ref Range   Cholesterol 303 (H) 0 - 200 mg/dL   Triglycerides 967 (H) <150 mg/dL   HDL 30 (L) >40 mg/dL   Total CHOL/HDL Ratio 10.1 RATIO   VLDL UNABLE TO CALCULATE IF TRIGLYCERIDE OVER 400 mg/dL 0 - 40 mg/dL   LDL Cholesterol UNABLE TO CALCULATE IF TRIGLYCERIDE OVER 400 mg/dL 0 - 99 mg/dL    Comment:        Total Cholesterol/HDL:CHD Risk Coronary Heart Disease Risk Table                     Men   Women  1/2 Average Risk   3.4   3.3  Average Risk       5.0   4.4  2 X Average Risk   9.6   7.1  3 X Average Risk  23.4   11.0        Use the calculated Patient Ratio above and the CHD Risk Table to determine the patient's CHD Risk.        ATP III CLASSIFICATION (LDL):  <100     mg/dL   Optimal  100-129  mg/dL   Near or Above                    Optimal  130-159  mg/dL   Borderline  160-189  mg/dL   High  >190     mg/dL   Very High   Glucose, capillary     Status: Abnormal   Collection Time: 11/29/14  7:33 AM  Result Value Ref Range   Glucose-Capillary 192 (H) 65 - 99 mg/dL   Comment 1 Notify RN     ABGS  Recent Labs  11/28/14 1253  TCO2 21   CULTURES No results found for this or any previous visit (from the past 240 hour(s)). Studies/Results: Ct Head Wo Contrast  11/28/2014   CLINICAL DATA:  Weakness right hand, slurred  speech  EXAM: CT HEAD WITHOUT CONTRAST  TECHNIQUE: Contiguous axial images were obtained from the base of the skull through the vertex without intravenous contrast.  COMPARISON:  03/18/2011  FINDINGS: No skull fracture is noted. Paranasal sinuses and mastoid air cells are unremarkable.  No intracranial hemorrhage, mass effect or midline shift. No definite acute cortical infarction. Stable  left periventricular white matter decreased attenuation adjacent to frontal horn. Stable small lacunar infarct in left basal ganglia.  No mass lesion is noted on this unenhanced scan. There is no hydrocephalus.  IMPRESSION: No acute intracranial abnormality. No definite acute cortical infarction. Stable chronic findings as described above.   Electronically Signed   By: Lahoma Crocker M.D.   On: 11/28/2014 13:39   Mr Jodene Nam Head Wo Contrast  11/28/2014   CLINICAL DATA:  Two days of intermittent expressive aphasia, with RIGHT and/or LEFT hand numbness.  EXAM: MRI HEAD WITHOUT CONTRAST  MRA HEAD WITHOUT CONTRAST  TECHNIQUE: Multiplanar, multiecho pulse sequences of the brain and surrounding structures were obtained without intravenous contrast. Angiographic images of the head were obtained using MRA technique without contrast.  COMPARISON:  CT head earlier today.  FINDINGS: MRI HEAD FINDINGS  No evidence for acute infarction, hemorrhage, mass lesion, hydrocephalus, or extra-axial fluid. Normal cerebral and cerebellar volume. Mild subcortical and periventricular T2 and FLAIR hyperintensities, likely chronic microvascular ischemic change.  Pituitary, pineal, and cerebellar tonsils unremarkable. No upper cervical lesions. LEFT carotid fissure perivascular space.  Flow voids are maintained throughout the RIGHT carotid, basilar, and vertebral arteries. The LEFT carotid appears of diminished caliber. There are no areas of chronic hemorrhage.  Visualized calvarium, skull base, and upper cervical osseous structures unremarkable. Scalp and  extracranial soft tissues, orbits, sinuses, and mastoids show no acute process.  MRA HEAD FINDINGS  The RIGHT internal carotid artery is widely patent in its cervical, petrous, and inferior cavernous segment. There is a 75% stenosis of the distal cavernous and supraclinoid ICA, potentially flow reducing.  Small caliber of the LEFT ICA in its cervical and petrous segments. There is concern of a severe stenosis at the LEFT ICA origin or even within the LEFT common carotid artery. Consider carotid Doppler for further evaluation.  There is severely diseased inferior cavernous segment LEFT ICA with near complete loss of flow related enhancement. Superior cavernous segment is diseased but visible, and the supraclinoid LEFT ICA segment is small but probably otherwise normal, given the proximal flow reduction.  Basilar artery widely patent. Vertebrals codominant. No proximal stenosis of the middle, or posterior cerebral arteries. There is a proximal stenosis at the origin of the LEFT anterior cerebral artery, estimated 50-75%. The LEFT anterior and middle cerebral arteries are smaller due to diminished in flow.  Mild irregularity of the distal MCA and PCA branches suggesting intracranial atherosclerotic disease. The LEFT PICA and BILATERAL AICA vessels are poorly seen. No intracranial aneurysm.  IMPRESSION: No acute stroke is evident.  Mild small vessel disease.  Suspected flow reducing 75% stenosis of the distal cavernous and supraclinoid RIGHT ICA.  Diminished caliber of the cervical and petrous LEFT ICA, concern raised for proximal flow reducing lesion. Consider carotid Dopplers for further evaluation.  Severely diseased cavernous segment LEFT ICA in conjunction with a moderate stenosis of the proximal LEFT anterior cerebral. Potential exists for LEFT hemisphere ischemia.   Electronically Signed   By: Staci Righter M.D.   On: 11/28/2014 20:09   Mr Brain Wo Contrast  11/28/2014   CLINICAL DATA:  Two days of  intermittent expressive aphasia, with RIGHT and/or LEFT hand numbness.  EXAM: MRI HEAD WITHOUT CONTRAST  MRA HEAD WITHOUT CONTRAST  TECHNIQUE: Multiplanar, multiecho pulse sequences of the brain and surrounding structures were obtained without intravenous contrast. Angiographic images of the head were obtained using MRA technique without contrast.  COMPARISON:  CT head earlier today.  FINDINGS: MRI HEAD  FINDINGS  No evidence for acute infarction, hemorrhage, mass lesion, hydrocephalus, or extra-axial fluid. Normal cerebral and cerebellar volume. Mild subcortical and periventricular T2 and FLAIR hyperintensities, likely chronic microvascular ischemic change.  Pituitary, pineal, and cerebellar tonsils unremarkable. No upper cervical lesions. LEFT carotid fissure perivascular space.  Flow voids are maintained throughout the RIGHT carotid, basilar, and vertebral arteries. The LEFT carotid appears of diminished caliber. There are no areas of chronic hemorrhage.  Visualized calvarium, skull base, and upper cervical osseous structures unremarkable. Scalp and extracranial soft tissues, orbits, sinuses, and mastoids show no acute process.  MRA HEAD FINDINGS  The RIGHT internal carotid artery is widely patent in its cervical, petrous, and inferior cavernous segment. There is a 75% stenosis of the distal cavernous and supraclinoid ICA, potentially flow reducing.  Small caliber of the LEFT ICA in its cervical and petrous segments. There is concern of a severe stenosis at the LEFT ICA origin or even within the LEFT common carotid artery. Consider carotid Doppler for further evaluation.  There is severely diseased inferior cavernous segment LEFT ICA with near complete loss of flow related enhancement. Superior cavernous segment is diseased but visible, and the supraclinoid LEFT ICA segment is small but probably otherwise normal, given the proximal flow reduction.  Basilar artery widely patent. Vertebrals codominant. No proximal  stenosis of the middle, or posterior cerebral arteries. There is a proximal stenosis at the origin of the LEFT anterior cerebral artery, estimated 50-75%. The LEFT anterior and middle cerebral arteries are smaller due to diminished in flow.  Mild irregularity of the distal MCA and PCA branches suggesting intracranial atherosclerotic disease. The LEFT PICA and BILATERAL AICA vessels are poorly seen. No intracranial aneurysm.  IMPRESSION: No acute stroke is evident.  Mild small vessel disease.  Suspected flow reducing 75% stenosis of the distal cavernous and supraclinoid RIGHT ICA.  Diminished caliber of the cervical and petrous LEFT ICA, concern raised for proximal flow reducing lesion. Consider carotid Dopplers for further evaluation.  Severely diseased cavernous segment LEFT ICA in conjunction with a moderate stenosis of the proximal LEFT anterior cerebral. Potential exists for LEFT hemisphere ischemia.   Electronically Signed   By: Staci Righter M.D.   On: 11/28/2014 20:09    Medications:  Prior to Admission:  Prescriptions prior to admission  Medication Sig Dispense Refill Last Dose  . ALPRAZolam (XANAX) 1 MG tablet Take 0.5 mg by mouth 2 (two) times daily.    11/28/2014 at Unknown time  . aspirin EC 81 MG tablet Take 81 mg by mouth daily.     11/27/2014 at 2200  . dexlansoprazole (DEXILANT) 60 MG capsule Take 60 mg by mouth daily as needed (acid reflux).   unknown  . dicyclomine (BENTYL) 10 MG capsule Take 1 capsule (10 mg total) by mouth 4 (four) times daily -  before meals and at bedtime. Take 1 capsule (10 mg total) by mouth 3 (three) times daily - before meals (Patient taking differently: Take 10 mg by mouth daily as needed for spasms. Take 1 capsule (10 mg total) by mouth 3 (three) times daily - before meals) 90 capsule 2 unknown  . gabapentin (NEURONTIN) 300 MG capsule Take 300 mg by mouth 3 (three) times daily.   11/28/2014 at Unknown time  . metFORMIN (GLUCOPHAGE) 500 MG tablet Take 500 mg by  mouth 2 (two) times daily with a meal.     11/28/2014 at Unknown time  . oxyCODONE (OXY IR/ROXICODONE) 5 MG immediate release tablet Take 5 mg  by mouth every 4 (four) hours as needed for moderate pain.    11/28/2014 at Unknown time  . colesevelam (WELCHOL) 625 MG tablet Take 3,750 mg by mouth daily.   Taking  . DEXILANT 60 MG capsule take 1 capsule by mouth every morning (Patient not taking: Reported on 11/28/2014) 30 capsule 5 Not Taking at Unknown time  . lisinopril (PRINIVIL,ZESTRIL) 20 MG tablet Take 20 mg by mouth daily.   Taking  . phenazopyridine (PYRIDIUM) 200 MG tablet Take 1 tablet (200 mg total) by mouth 3 (three) times daily as needed for pain. (Patient not taking: Reported on 11/28/2014) 6 tablet 0 Not Taking at Unknown time   Scheduled: .  stroke: mapping our early stages of recovery book   Does not apply Once  . ALPRAZolam  0.5 mg Oral BID  . aspirin EC  81 mg Oral Daily  . atorvastatin  80 mg Oral q1800  . clopidogrel  75 mg Oral Daily  . enoxaparin (LOVENOX) injection  40 mg Subcutaneous Q24H  . gabapentin  300 mg Oral TID  . insulin aspart  0-9 Units Subcutaneous TID WC  . nicotine  21 mg Transdermal Daily  . pantoprazole  80 mg Oral Daily   Continuous:  SVX:[id number] **OR** acetaminophen, dicyclomine  Assesment: I think she had a TIA. She has been under a great deal of stress lately and I think that has made things worse. She appears to have significant carotid arterial disease. Principal Problem:   TIA (transient ischemic attack) Active Problems:   DM type 2 (diabetes mellitus, type 2)   Tobacco use disorder    Plan: She will have echocardiogram, carotid Doppler, neurology consult. I will start her on atorvastatin and Plavix. She will need outpatient evaluation by vascular surgery and she wants to do that at [facility] because her son has been at [facility] frequently and is actually currently there on the transplant list.      HAWKINS,EDWARD L 11/29/2014, 10:47 AM

## 2014-11-29 NOTE — Consult Note (Signed)
[place] A. Merlene Laughter, MD     [email or web address]          Grace Hess is an 59 y.o. female.   ASSESSMENT/PLAN:  1. 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 Risk factors marked dyslipidemia including severe hypertriglyceridemia, nicotine addiction, hypertension, and diabetes.  2. Asymptomatic extracranial right carotid stenosis. 3. Chronic left basal ganglia infarct.    RECOMMENDATION: The patient should be evaluated by vascular surgery for urgent carotid endarterectomy.  Treatment for the intracranial occlusive lesion should be medical management. The patient will need aggressive risk factor modification including smoking cessation, diabetes control, pressure control and the use of statin medication. I would also recommend the patient be placed on dual antiplatelet agents for about 6 months.  The patient is a 59 year old right-handed white female who has had recurrent episodes over the last 6 weeks. The patient tells me that she has been having binocular triplopia over the last 6 weeks occurring almost on a daily basis lasting for a few minutes. Over the last 4 weeks she has had episodes of weakness lasting for a few seconds involving the right upper extremity. She however became more concerned when she developed the acute onset of severe left sided headache associated with a fascia and the right upper extremity numbness lasting for several minutes. She decided to seek medical attention. The patient is prescribed aspirin 81 mg coated once a day but she has not been taking it over the last 2-3 weeks simply because she ran out. The patient has a history of hypertension diabetes. She tells me that she has been compliant with her medications and these are being controlled. She does smoke 2 packs of cigarettes daily. She reports that she has been under a lot of  stress especially with her son who is hospitalized at [facility]. The patient does not report having other symptoms at this time. She denies chest pain, shortness of breath or GI or GU symptoms. Review of systems otherwise negative.  GENERAL: This is a very pleasant female in no acute distress.  HEENT: Supple. Atraumatic normocephalic.   ABDOMEN: soft  EXTREMITIES: No edema   BACK: Normal.  SKIN: Normal by inspection.    MENTAL STATUS: Alert and oriented. Speech, language and cognition are generally intact. Judgment and insight normal. She has good naming, fluency and comprehension. Age is stated is 59 and the month as September.  CRANIAL NERVES: Pupils are equal, round and reactive to light and accommodation; extra ocular movements are full, there is no significant nystagmus; visual fields are full; upper and lower facial muscles are normal in strength and symmetric, there is no flattening of the nasolabial folds; tongue is midline; uvula is midline; shoulder elevation is normal.  MOTOR: Normal tone, bulk and strength; no pronator drift.  COORDINATION: Left finger to nose is normal, right finger to nose is normal, No rest tremor; no intention tremor; no postural tremor; no bradykinesia.  REFLEXES: Deep tendon reflexes are symmetrical and normal. Babinski reflexes are flexor bilaterally.   SENSATION: Normal to light touch. There is no extinction to visual or tactile double simultaneous stimulation.  NIH stroke scale 0  The brain MRI and MRA reviewed in person. No acute findings are noted on diffusion imaging. 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 There is also mild: Caryl Pina. No microhemorrhages are appreciated. There is small infarcts involving the basal ganglia on the left side. There  is marked stenosis of the intracranial carotid particularly the cavernous portion.     Blood pressure 138/76, pulse 67, temperature 98.4 F (36.9 C), temperature  source Oral, resp. rate 18, height '5\' 6"'  (1.676 m), weight 71.033 kg (156 lb 9.6 oz), SpO2 96 %.  Past Medical History  Diagnosis Date  . Diabetes mellitus   . Hypercholesteremia   . Obesity   . Chest pain, unspecified   . Reflux esophagitis   . Cervicalgia     Past Surgical History  Procedure Laterality Date  . Cholecystectomy    . Partial hysterectomy      Family History  Problem Relation Age of Onset  . COPD Father   . Congestive Heart Failure Mother   . Cancer Brother     Social History:  reports that she has been smoking Cigarettes.  She has been smoking about 2.00 packs per day. She does not have any smokeless tobacco history on file. She reports that she does not drink alcohol or use illicit drugs.  Allergies:  Allergies  Allergen Reactions  . Codeine Shortness Of Breath  . Sulfa Antibiotics     Childhood allergy    Medications: Prior to Admission medications   Medication Sig Start Date End Date Taking? Authorizing Provider  ALPRAZolam Duanne Moron) 1 MG tablet Take 0.5 mg by mouth 2 (two) times daily.    Yes Historical Provider, MD  aspirin EC 81 MG tablet Take 81 mg by mouth daily.     Yes Historical Provider, MD  dexlansoprazole (DEXILANT) 60 MG capsule Take 60 mg by mouth daily as needed (acid reflux).   Yes Historical Provider, MD  dicyclomine (BENTYL) 10 MG capsule Take 1 capsule (10 mg total) by mouth 4 (four) times daily -  before meals and at bedtime. Take 1 capsule (10 mg total) by mouth 3 (three) times daily - before meals Patient taking differently: Take 10 mg by mouth daily as needed for spasms. Take 1 capsule (10 mg total) by mouth 3 (three) times daily - before meals 07/17/13  Yes Rogene Houston, MD  gabapentin (NEURONTIN) 300 MG capsule Take 300 mg by mouth 3 (three) times daily.   Yes Historical Provider, MD  metFORMIN (GLUCOPHAGE) 500 MG tablet Take 500 mg by mouth 2 (two) times daily with a meal.     Yes Historical Provider, MD  oxyCODONE (OXY  IR/ROXICODONE) 5 MG immediate release tablet Take 5 mg by mouth every 4 (four) hours as needed for moderate pain.  04/08/12  Yes Historical Provider, MD  colesevelam (WELCHOL) 625 MG tablet Take 3,750 mg by mouth daily.    Historical Provider, MD  DEXILANT 60 MG capsule take 1 capsule by mouth every morning Patient not taking: Reported on 11/28/2014 08/26/14   Rogene Houston, MD  lisinopril (PRINIVIL,ZESTRIL) 20 MG tablet Take 20 mg by mouth daily.    Historical Provider, MD  phenazopyridine (PYRIDIUM) 200 MG tablet Take 1 tablet (200 mg total) by mouth 3 (three) times daily as needed for pain. Patient not taking: Reported on 11/28/2014 10/20/13   Veryl Speak, MD    Scheduled Meds: .  stroke: mapping our early stages of recovery book   Does not apply Once  . ALPRAZolam  0.5 mg Oral BID  . aspirin EC  81 mg Oral Daily  . atorvastatin  80 mg Oral q1800  . clopidogrel  75 mg Oral Daily  . enoxaparin (LOVENOX) injection  40 mg Subcutaneous Q24H  . gabapentin  300 mg  Oral TID  . insulin aspart  0-9 Units Subcutaneous TID WC  . nicotine  21 mg Transdermal Daily  . pantoprazole  80 mg Oral Daily   Continuous Infusions:  PRN Meds:.acetaminophen **OR** acetaminophen, dicyclomine     Results for orders placed or performed during the hospital encounter of 11/28/14 (from the past 48 hour(s))  Ethanol     Status: None   Collection Time: 11/28/14 12:40 PM  Result Value Ref Range   Alcohol, Ethyl (B) <5 <5 mg/dL    Comment:        LOWEST DETECTABLE LIMIT FOR SERUM ALCOHOL IS 5 mg/dL FOR MEDICAL PURPOSES ONLY   Protime-INR     Status: None   Collection Time: 11/28/14 12:40 PM  Result Value Ref Range   Prothrombin Time 13.3 11.6 - 15.2 seconds   INR 0.99 0.00 - 1.49  APTT     Status: None   Collection Time: 11/28/14 12:40 PM  Result Value Ref Range   aPTT 34 24 - 37 seconds  CBC     Status: Abnormal   Collection Time: 11/28/14 12:40 PM  Result Value Ref Range   WBC 12.9 (H) 4.0 - 10.5  K/uL   RBC 4.57 3.87 - 5.11 MIL/uL   Hemoglobin 14.1 12.0 - 15.0 g/dL   HCT 41.8 36.0 - 46.0 %   MCV 91.5 78.0 - 100.0 fL   MCH 30.9 26.0 - 34.0 pg   MCHC 33.7 30.0 - 36.0 g/dL   RDW 13.7 11.5 - 15.5 %   Platelets 256 150 - 400 K/uL  Differential     Status: Abnormal   Collection Time: 11/28/14 12:40 PM  Result Value Ref Range   Neutrophils Relative % 64 %   Neutro Abs 8.2 (H) 1.7 - 7.7 K/uL   Lymphocytes Relative 29 %   Lymphs Abs 3.7 0.7 - 4.0 K/uL   Monocytes Relative 5 %   Monocytes Absolute 0.7 0.1 - 1.0 K/uL   Eosinophils Relative 2 %   Eosinophils Absolute 0.3 0.0 - 0.7 K/uL   Basophils Relative 0 %   Basophils Absolute 0.0 0.0 - 0.1 K/uL  Comprehensive metabolic panel     Status: Abnormal   Collection Time: 11/28/14 12:40 PM  Result Value Ref Range   Sodium 136 135 - 145 mmol/L   Potassium 4.6 3.5 - 5.1 mmol/L   Chloride 103 101 - 111 mmol/L   CO2 22 22 - 32 mmol/L   Glucose, Bld 259 (H) 65 - 99 mg/dL   BUN 24 (H) 6 - 20 mg/dL   Creatinine, Ser 0.90 0.44 - 1.00 mg/dL   Calcium 9.1 8.9 - 10.3 mg/dL   Total Protein 7.7 6.5 - 8.1 g/dL   Albumin 4.5 3.5 - 5.0 g/dL   AST 20 15 - 41 U/L   ALT 18 14 - 54 U/L   Alkaline Phosphatase 64 38 - 126 U/L   Total Bilirubin 0.5 0.3 - 1.2 mg/dL   GFR calc non Af Amer >60 >60 mL/min   GFR calc Af Amer >60 >60 mL/min    Comment: (NOTE) The eGFR has been calculated using the CKD EPI equation. This calculation has not been validated in all clinical situations. eGFR's persistently <60 mL/min signify possible Chronic Kidney Disease.    Anion gap 11 5 - 15  I-stat troponin, ED (not at [facility])     Status: None   Collection Time: 11/28/14 12:51 PM  Result Value Ref Range   Troponin i,  poc 0.00 0.00 - 0.08 ng/mL   Comment 3            Comment: Due to the release kinetics of cTnI, a negative result within the first hours of the onset of symptoms does not rule out myocardial infarction with certainty. If myocardial infarction is  still suspected, repeat the test at appropriate intervals.   I-Stat Chem 8, ED  (not at [facility])     Status: Abnormal   Collection Time: 11/28/14 12:53 PM  Result Value Ref Range   Sodium 137 135 - 145 mmol/L   Potassium 4.7 3.5 - 5.1 mmol/L   Chloride 104 101 - 111 mmol/L   BUN 25 (H) 6 - 20 mg/dL   Creatinine, Ser 0.80 0.44 - 1.00 mg/dL   Glucose, Bld 261 (H) 65 - 99 mg/dL   Calcium, Ion 1.18 1.12 - 1.23 mmol/L   TCO2 21 0 - 100 mmol/L   Hemoglobin 15.6 (H) 12.0 - 15.0 g/dL   HCT 46.0 36.0 - 46.0 %  Urine rapid drug screen (hosp performed)not at [facility]     Status: Abnormal   Collection Time: 11/28/14  1:00 PM  Result Value Ref Range   Opiates NONE DETECTED NONE DETECTED   Cocaine NONE DETECTED NONE DETECTED   Benzodiazepines POSITIVE (A) NONE DETECTED   Amphetamines NONE DETECTED NONE DETECTED   Tetrahydrocannabinol NONE DETECTED NONE DETECTED   Barbiturates NONE DETECTED NONE DETECTED    Comment:        DRUG SCREEN FOR MEDICAL PURPOSES ONLY.  IF CONFIRMATION IS NEEDED FOR ANY PURPOSE, NOTIFY LAB WITHIN 5 DAYS.        LOWEST DETECTABLE LIMITS FOR URINE DRUG SCREEN Drug Class       Cutoff (ng/mL) Amphetamine      1000 Barbiturate      200 Benzodiazepine   151 Tricyclics       761 Opiates          300 Cocaine          300 THC              50   Urinalysis, Routine w reflex microscopic (not at [facility])     Status: Abnormal   Collection Time: 11/28/14  1:08 PM  Result Value Ref Range   Color, Urine YELLOW YELLOW   APPearance CLEAR CLEAR   Specific Gravity, Urine 1.020 1.005 - 1.030   pH 6.0 5.0 - 8.0   Glucose, UA 250 (A) NEGATIVE mg/dL   Hgb urine dipstick SMALL (A) NEGATIVE   Bilirubin Urine NEGATIVE NEGATIVE   Ketones, ur NEGATIVE NEGATIVE mg/dL   Protein, ur NEGATIVE NEGATIVE mg/dL   Urobilinogen, UA 0.2 0.0 - 1.0 mg/dL   Nitrite NEGATIVE NEGATIVE   Leukocytes, UA NEGATIVE NEGATIVE  Urine microscopic-add on     Status: Abnormal   Collection Time: 11/28/14  1:08  PM  Result Value Ref Range   Squamous Epithelial / LPF MANY (A) RARE   WBC, UA 0-2 <3 WBC/hpf   RBC / HPF 0-2 <3 RBC/hpf   Bacteria, UA MANY (A) RARE  Glucose, capillary     Status: Abnormal   Collection Time: 11/28/14  4:21 PM  Result Value Ref Range   Glucose-Capillary 165 (H) 65 - 99 mg/dL   Comment 1 Notify RN    Comment 2 Document in Chart   Glucose, capillary     Status: Abnormal   Collection Time: 11/28/14  8:28 PM  Result Value Ref Range  Glucose-Capillary 190 (H) 65 - 99 mg/dL  Lipid panel     Status: Abnormal   Collection Time: 11/29/14  6:30 AM  Result Value Ref Range   Cholesterol 303 (H) 0 - 200 mg/dL   Triglycerides 967 (H) <150 mg/dL   HDL 30 (L) >40 mg/dL   Total CHOL/HDL Ratio 10.1 RATIO   VLDL UNABLE TO CALCULATE IF TRIGLYCERIDE OVER 400 mg/dL 0 - 40 mg/dL   LDL Cholesterol UNABLE TO CALCULATE IF TRIGLYCERIDE OVER 400 mg/dL 0 - 99 mg/dL    Comment:        Total Cholesterol/HDL:CHD Risk Coronary Heart Disease Risk Table                     Men   Women  1/2 Average Risk   3.4   3.3  Average Risk       5.0   4.4  2 X Average Risk   9.6   7.1  3 X Average Risk  23.4   11.0        Use the calculated Patient Ratio above and the CHD Risk Table to determine the patient's CHD Risk.        ATP III CLASSIFICATION (LDL):  <100     mg/dL   Optimal  100-129  mg/dL   Near or Above                    Optimal  130-159  mg/dL   Borderline  160-189  mg/dL   High  >190     mg/dL   Very High   Glucose, capillary     Status: Abnormal   Collection Time: 11/29/14  7:33 AM  Result Value Ref Range   Glucose-Capillary 192 (H) 65 - 99 mg/dL   Comment 1 Notify RN   Glucose, capillary     Status: Abnormal   Collection Time: 11/29/14 11:38 AM  Result Value Ref Range   Glucose-Capillary 298 (H) 65 - 99 mg/dL   Comment 1 Notify RN     Studies/Results: BRAIN MRI/MRA No acute stroke is evident. Mild small vessel disease.  Suspected flow reducing 75% stenosis of the  distal cavernous and supraclinoid RIGHT ICA.  Diminished caliber of the cervical and petrous LEFT ICA, concern raised for proximal flow reducing lesion. Consider carotid Dopplers for further evaluation.  Severely diseased cavernous segment LEFT ICA in conjunction with a moderate stenosis of the proximal LEFT anterior cerebral. Potential exists for LEFT hemisphere ischemia.    ECHO  - Mild basal septal LV hypertrophy with LVEF 60-65% and grade 1 diastolic dysfunction. Mildly calcified mitral annulus as well as mildly sclerotic aortic valve. Trivial aortic regurgitation. No obvious PFO or ASD.   CAROTID DOPPLERS: 1. Moderate (50- 69%) stenosis proximal right internal carotid artery secondary to heterogeneous atherosclerotic plaque. 2. Severe (70- 99%) stenosis proximal left internal carotid artery secondary to heterogeneous atherosclerotic plaque. 3. Severe stenosis of the proximal left external carotid artery noted incidentally. 4. Smooth focal atherosclerotic plaque in the mid left common carotid artery without significant stenosis. 5. Vertebral arteries are patent with normal antegrade flow.     Kofi A. Merlene Laughter, M.D.  Diplomate, Tax adviser of Psychiatry and Neurology ( Neurology). 11/29/2014, 4:38 PM

## 2014-11-30 DIAGNOSIS — G459 Transient cerebral ischemic attack, unspecified: Secondary | ICD-10-CM | POA: Diagnosis present

## 2014-11-30 DIAGNOSIS — I6523 Occlusion and stenosis of bilateral carotid arteries: Secondary | ICD-10-CM | POA: Diagnosis present

## 2014-11-30 DIAGNOSIS — F17210 Nicotine dependence, cigarettes, uncomplicated: Secondary | ICD-10-CM | POA: Diagnosis present

## 2014-11-30 DIAGNOSIS — K210 Gastro-esophageal reflux disease with esophagitis: Secondary | ICD-10-CM | POA: Diagnosis present

## 2014-11-30 DIAGNOSIS — Z23 Encounter for immunization: Secondary | ICD-10-CM | POA: Diagnosis not present

## 2014-11-30 DIAGNOSIS — E669 Obesity, unspecified: Secondary | ICD-10-CM | POA: Diagnosis present

## 2014-11-30 DIAGNOSIS — I517 Cardiomegaly: Secondary | ICD-10-CM | POA: Diagnosis present

## 2014-11-30 DIAGNOSIS — Z7982 Long term (current) use of aspirin: Secondary | ICD-10-CM | POA: Diagnosis not present

## 2014-11-30 DIAGNOSIS — I779 Disorder of arteries and arterioles, unspecified: Secondary | ICD-10-CM | POA: Diagnosis not present

## 2014-11-30 DIAGNOSIS — E1165 Type 2 diabetes mellitus with hyperglycemia: Secondary | ICD-10-CM | POA: Diagnosis present

## 2014-11-30 DIAGNOSIS — K219 Gastro-esophageal reflux disease without esophagitis: Secondary | ICD-10-CM | POA: Diagnosis present

## 2014-11-30 DIAGNOSIS — Z6825 Body mass index (BMI) 25.0-25.9, adult: Secondary | ICD-10-CM | POA: Diagnosis not present

## 2014-11-30 DIAGNOSIS — E781 Pure hyperglyceridemia: Secondary | ICD-10-CM | POA: Diagnosis present

## 2014-11-30 DIAGNOSIS — I10 Essential (primary) hypertension: Secondary | ICD-10-CM | POA: Diagnosis present

## 2014-11-30 DIAGNOSIS — I6522 Occlusion and stenosis of left carotid artery: Secondary | ICD-10-CM | POA: Diagnosis not present

## 2014-11-30 DIAGNOSIS — R531 Weakness: Secondary | ICD-10-CM | POA: Diagnosis present

## 2014-11-30 DIAGNOSIS — R4701 Aphasia: Secondary | ICD-10-CM | POA: Diagnosis present

## 2014-11-30 DIAGNOSIS — E780 Pure hypercholesterolemia: Secondary | ICD-10-CM | POA: Diagnosis present

## 2014-11-30 DIAGNOSIS — E1149 Type 2 diabetes mellitus with other diabetic neurological complication: Secondary | ICD-10-CM | POA: Diagnosis not present

## 2014-11-30 LAB — GLUCOSE, CAPILLARY
GLUCOSE-CAPILLARY: 204 mg/dL — AB (ref 65–99)
GLUCOSE-CAPILLARY: 204 mg/dL — AB (ref 65–99)
Glucose-Capillary: 204 mg/dL — ABNORMAL HIGH (ref 65–99)
Glucose-Capillary: 218 mg/dL — ABNORMAL HIGH (ref 65–99)

## 2014-11-30 LAB — HEMOGLOBIN A1C
Hgb A1c MFr Bld: 8.6 % — ABNORMAL HIGH (ref 4.8–5.6)
Mean Plasma Glucose: 200 mg/dL

## 2014-11-30 NOTE — Progress Notes (Signed)
Report given to [facility] and nurse [facility] at [facility].  Patient in stable condition.  Family notified of transfer, patient consents to transfer.

## 2014-11-30 NOTE — Progress Notes (Signed)
Pt admitted from [facility] with stroke symptoms, pt alert and oriented,denies any pain at this time, pt settled in bed, call light at bedside, will however continue to monitor. Grace Hess, Grace Hess

## 2014-11-30 NOTE — Progress Notes (Signed)
Subjective: Events noted. Neurology consultation is noted and appreciated. I have discussed all this with the patient and although originally since she thought that her surgery could be done perhaps more electively she was hoping to have it done at [facility] because of her son's situation. However now that I have explained the situation to her she understands and wants to go ahead and proceed with treatment as soon as possible. I think that's the best solution and I have discussed her situation with Dr.Brabham vascular surgery in [place]. He says she should be transferred to [facility] and he requests that she be transferred to the hospitalist service because of her multiple medical problems.  Objective: Vital signs in last 24 hours: Temp:  [97.8 F (36.6 C)-99 F (37.2 C)] 97.9 F (36.6 C) (09/17 0409) Pulse Rate:  [67-84] 69 (09/17 0409) Resp:  [18-20] 20 (09/17 0409) BP: (111-149)/(53-76) 111/62 mmHg (09/17 0409) SpO2:  [96 %-99 %] 96 % (09/17 0409) Weight change:  Last BM Date: 11/28/14  Intake/Output from previous day: 09/16 0701 - 09/17 0700 In: 360 [P.O.:360] Out: 500 [Urine:500]  PHYSICAL EXAM General appearance: alert, cooperative and mild distress Resp: clear to auscultation bilaterally Cardio: regular rate and rhythm, S1, S2 normal, no murmur, click, rub or gallop GI: soft, non-tender; bowel sounds normal; no masses,  no organomegaly Extremities: extremities normal, atraumatic, no cyanosis or edema  Lab Results:  Results for orders placed or performed during the hospital encounter of 11/28/14 (from the past 48 hour(s))  Ethanol     Status: None   Collection Time: 11/28/14 12:40 PM  Result Value Ref Range   Alcohol, Ethyl (B) <5 <5 mg/dL    Comment:        LOWEST DETECTABLE LIMIT FOR SERUM ALCOHOL IS 5 mg/dL FOR MEDICAL PURPOSES ONLY   Protime-INR     Status: None   Collection Time: 11/28/14 12:40 PM  Result Value Ref Range   Prothrombin Time 13.3 11.6 -  15.2 seconds   INR 0.99 0.00 - 1.49  APTT     Status: None   Collection Time: 11/28/14 12:40 PM  Result Value Ref Range   aPTT 34 24 - 37 seconds  CBC     Status: Abnormal   Collection Time: 11/28/14 12:40 PM  Result Value Ref Range   WBC 12.9 (H) 4.0 - 10.5 K/uL   RBC 4.57 3.87 - 5.11 MIL/uL   Hemoglobin 14.1 12.0 - 15.0 g/dL   HCT 41.8 36.0 - 46.0 %   MCV 91.5 78.0 - 100.0 fL   MCH 30.9 26.0 - 34.0 pg   MCHC 33.7 30.0 - 36.0 g/dL   RDW 13.7 11.5 - 15.5 %   Platelets 256 150 - 400 K/uL  Differential     Status: Abnormal   Collection Time: 11/28/14 12:40 PM  Result Value Ref Range   Neutrophils Relative % 64 %   Neutro Abs 8.2 (H) 1.7 - 7.7 K/uL   Lymphocytes Relative 29 %   Lymphs Abs 3.7 0.7 - 4.0 K/uL   Monocytes Relative 5 %   Monocytes Absolute 0.7 0.1 - 1.0 K/uL   Eosinophils Relative 2 %   Eosinophils Absolute 0.3 0.0 - 0.7 K/uL   Basophils Relative 0 %   Basophils Absolute 0.0 0.0 - 0.1 K/uL  Comprehensive metabolic panel     Status: Abnormal   Collection Time: 11/28/14 12:40 PM  Result Value Ref Range   Sodium 136 135 - 145 mmol/L   Potassium  4.6 3.5 - 5.1 mmol/L   Chloride 103 101 - 111 mmol/L   CO2 22 22 - 32 mmol/L   Glucose, Bld 259 (H) 65 - 99 mg/dL   BUN 24 (H) 6 - 20 mg/dL   Creatinine, Ser 0.90 0.44 - 1.00 mg/dL   Calcium 9.1 8.9 - 10.3 mg/dL   Total Protein 7.7 6.5 - 8.1 g/dL   Albumin 4.5 3.5 - 5.0 g/dL   AST 20 15 - 41 U/L   ALT 18 14 - 54 U/L   Alkaline Phosphatase 64 38 - 126 U/L   Total Bilirubin 0.5 0.3 - 1.2 mg/dL   GFR calc non Af Amer >60 >60 mL/min   GFR calc Af Amer >60 >60 mL/min    Comment: (NOTE) The eGFR has been calculated using the CKD EPI equation. This calculation has not been validated in all clinical situations. eGFR's persistently <60 mL/min signify possible Chronic Kidney Disease.    Anion gap 11 5 - 15  Hemoglobin A1c     Status: Abnormal   Collection Time: 11/28/14 12:40 PM  Result Value Ref Range   Hgb A1c MFr Bld  8.6 (H) 4.8 - 5.6 %    Comment: (NOTE)         Pre-diabetes: 5.7 - 6.4         Diabetes: >6.4         Glycemic control for adults with diabetes: <7.0    Mean Plasma Glucose 200 mg/dL    Comment: (NOTE) Performed At: [facility] [address] [place], [place] [id number] Lindon Romp MD TD:[id number]   I-stat troponin, ED (not at [facility], [facility])     Status: None   Collection Time: 11/28/14 12:51 PM  Result Value Ref Range   Troponin i, poc 0.00 0.00 - 0.08 ng/mL   Comment 3            Comment: Due to the release kinetics of cTnI, a negative result within the first hours of the onset of symptoms does not rule out myocardial infarction with certainty. If myocardial infarction is still suspected, repeat the test at appropriate intervals.   I-Stat Chem 8, ED  (not at [facility], [facility])     Status: Abnormal   Collection Time: 11/28/14 12:53 PM  Result Value Ref Range   Sodium 137 135 - 145 mmol/L   Potassium 4.7 3.5 - 5.1 mmol/L   Chloride 104 101 - 111 mmol/L   BUN 25 (H) 6 - 20 mg/dL   Creatinine, Ser 0.80 0.44 - 1.00 mg/dL   Glucose, Bld 261 (H) 65 - 99 mg/dL   Calcium, Ion 1.18 1.12 - 1.23 mmol/L   TCO2 21 0 - 100 mmol/L   Hemoglobin 15.6 (H) 12.0 - 15.0 g/dL   HCT 46.0 36.0 - 46.0 %  Urine rapid drug screen (hosp performed)not at [facility]     Status: Abnormal   Collection Time: 11/28/14  1:00 PM  Result Value Ref Range   Opiates NONE DETECTED NONE DETECTED   Cocaine NONE DETECTED NONE DETECTED   Benzodiazepines POSITIVE (A) NONE DETECTED   Amphetamines NONE DETECTED NONE DETECTED   Tetrahydrocannabinol NONE DETECTED NONE DETECTED   Barbiturates NONE DETECTED NONE DETECTED    Comment:        DRUG SCREEN FOR MEDICAL PURPOSES ONLY.  IF CONFIRMATION IS NEEDED FOR ANY PURPOSE, NOTIFY LAB WITHIN 5 DAYS.        LOWEST DETECTABLE LIMITS FOR URINE DRUG SCREEN Drug Class  Cutoff (ng/mL) Amphetamine      1000 Barbiturate      200 Benzodiazepine   517 Tricyclics        616 Opiates          300 Cocaine          300 THC              50   Urinalysis, Routine w reflex microscopic (not at [facility])     Status: Abnormal   Collection Time: 11/28/14  1:08 PM  Result Value Ref Range   Color, Urine YELLOW YELLOW   APPearance CLEAR CLEAR   Specific Gravity, Urine 1.020 1.005 - 1.030   pH 6.0 5.0 - 8.0   Glucose, UA 250 (A) NEGATIVE mg/dL   Hgb urine dipstick SMALL (A) NEGATIVE   Bilirubin Urine NEGATIVE NEGATIVE   Ketones, ur NEGATIVE NEGATIVE mg/dL   Protein, ur NEGATIVE NEGATIVE mg/dL   Urobilinogen, UA 0.2 0.0 - 1.0 mg/dL   Nitrite NEGATIVE NEGATIVE   Leukocytes, UA NEGATIVE NEGATIVE  Urine microscopic-add on     Status: Abnormal   Collection Time: 11/28/14  1:08 PM  Result Value Ref Range   Squamous Epithelial / LPF MANY (A) RARE   WBC, UA 0-2 <3 WBC/hpf   RBC / HPF 0-2 <3 RBC/hpf   Bacteria, UA MANY (A) RARE  Glucose, capillary     Status: Abnormal   Collection Time: 11/28/14  4:21 PM  Result Value Ref Range   Glucose-Capillary 165 (H) 65 - 99 mg/dL   Comment 1 Notify RN    Comment 2 Document in Chart   Glucose, capillary     Status: Abnormal   Collection Time: 11/28/14  8:28 PM  Result Value Ref Range   Glucose-Capillary 190 (H) 65 - 99 mg/dL  Lipid panel     Status: Abnormal   Collection Time: 11/29/14  6:30 AM  Result Value Ref Range   Cholesterol 303 (H) 0 - 200 mg/dL   Triglycerides 967 (H) <150 mg/dL   HDL 30 (L) >40 mg/dL   Total CHOL/HDL Ratio 10.1 RATIO   VLDL UNABLE TO CALCULATE IF TRIGLYCERIDE OVER 400 mg/dL 0 - 40 mg/dL   LDL Cholesterol UNABLE TO CALCULATE IF TRIGLYCERIDE OVER 400 mg/dL 0 - 99 mg/dL    Comment:        Total Cholesterol/HDL:CHD Risk Coronary Heart Disease Risk Table                     Men   Women  1/2 Average Risk   3.4   3.3  Average Risk       5.0   4.4  2 X Average Risk   9.6   7.1  3 X Average Risk  23.4   11.0        Use the calculated Patient Ratio above and the CHD Risk Table to determine the  patient's CHD Risk.        ATP III CLASSIFICATION (LDL):  <100     mg/dL   Optimal  100-129  mg/dL   Near or Above                    Optimal  130-159  mg/dL   Borderline  160-189  mg/dL   High  >190     mg/dL   Very High   Glucose, capillary     Status: Abnormal   Collection Time: 11/29/14  7:33 AM  Result Value  Ref Range   Glucose-Capillary 192 (H) 65 - 99 mg/dL   Comment 1 Notify RN   Glucose, capillary     Status: Abnormal   Collection Time: 11/29/14 11:38 AM  Result Value Ref Range   Glucose-Capillary 298 (H) 65 - 99 mg/dL   Comment 1 Notify RN   Glucose, capillary     Status: Abnormal   Collection Time: 11/29/14  5:35 PM  Result Value Ref Range   Glucose-Capillary 128 (H) 65 - 99 mg/dL   Comment 1 Notify RN    Comment 2 Document in Chart   Glucose, capillary     Status: Abnormal   Collection Time: 11/29/14  9:20 PM  Result Value Ref Range   Glucose-Capillary 327 (H) 65 - 99 mg/dL   Comment 1 Notify RN   Glucose, capillary     Status: Abnormal   Collection Time: 11/30/14  7:33 AM  Result Value Ref Range   Glucose-Capillary 204 (H) 65 - 99 mg/dL   Comment 1 Notify RN     ABGS  Recent Labs  11/28/14 1253  TCO2 21   CULTURES No results found for this or any previous visit (from the past 240 hour(s)). Studies/Results: Ct Head Wo Contrast  11/28/2014   CLINICAL DATA:  Weakness right hand, slurred speech  EXAM: CT HEAD WITHOUT CONTRAST  TECHNIQUE: Contiguous axial images were obtained from the base of the skull through the vertex without intravenous contrast.  COMPARISON:  03/18/2011  FINDINGS: No skull fracture is noted. Paranasal sinuses and mastoid air cells are unremarkable.  No intracranial hemorrhage, mass effect or midline shift. No definite acute cortical infarction. Stable left periventricular white matter decreased attenuation adjacent to frontal horn. Stable small lacunar infarct in left basal ganglia.  No mass lesion is noted on this unenhanced scan. There  is no hydrocephalus.  IMPRESSION: No acute intracranial abnormality. No definite acute cortical infarction. Stable chronic findings as described above.   Electronically Signed   By: Lahoma Crocker M.D.   On: 11/28/2014 13:39   Mr Jodene Nam Head Wo Contrast  11/28/2014   CLINICAL DATA:  Two days of intermittent expressive aphasia, with RIGHT and/or LEFT hand numbness.  EXAM: MRI HEAD WITHOUT CONTRAST  MRA HEAD WITHOUT CONTRAST  TECHNIQUE: Multiplanar, multiecho pulse sequences of the brain and surrounding structures were obtained without intravenous contrast. Angiographic images of the head were obtained using MRA technique without contrast.  COMPARISON:  CT head earlier today.  FINDINGS: MRI HEAD FINDINGS  No evidence for acute infarction, hemorrhage, mass lesion, hydrocephalus, or extra-axial fluid. Normal cerebral and cerebellar volume. Mild subcortical and periventricular T2 and FLAIR hyperintensities, likely chronic microvascular ischemic change.  Pituitary, pineal, and cerebellar tonsils unremarkable. No upper cervical lesions. LEFT carotid fissure perivascular space.  Flow voids are maintained throughout the RIGHT carotid, basilar, and vertebral arteries. The LEFT carotid appears of diminished caliber. There are no areas of chronic hemorrhage.  Visualized calvarium, skull base, and upper cervical osseous structures unremarkable. Scalp and extracranial soft tissues, orbits, sinuses, and mastoids show no acute process.  MRA HEAD FINDINGS  The RIGHT internal carotid artery is widely patent in its cervical, petrous, and inferior cavernous segment. There is a 75% stenosis of the distal cavernous and supraclinoid ICA, potentially flow reducing.  Small caliber of the LEFT ICA in its cervical and petrous segments. There is concern of a severe stenosis at the LEFT ICA origin or even within the LEFT common carotid artery. Consider carotid Doppler for further evaluation.  There is severely diseased inferior cavernous segment  LEFT ICA with near complete loss of flow related enhancement. Superior cavernous segment is diseased but visible, and the supraclinoid LEFT ICA segment is small but probably otherwise normal, given the proximal flow reduction.  Basilar artery widely patent. Vertebrals codominant. No proximal stenosis of the middle, or posterior cerebral arteries. There is a proximal stenosis at the origin of the LEFT anterior cerebral artery, estimated 50-75%. The LEFT anterior and middle cerebral arteries are smaller due to diminished in flow.  Mild irregularity of the distal MCA and PCA branches suggesting intracranial atherosclerotic disease. The LEFT PICA and BILATERAL AICA vessels are poorly seen. No intracranial aneurysm.  IMPRESSION: No acute stroke is evident.  Mild small vessel disease.  Suspected flow reducing 75% stenosis of the distal cavernous and supraclinoid RIGHT ICA.  Diminished caliber of the cervical and petrous LEFT ICA, concern raised for proximal flow reducing lesion. Consider carotid Dopplers for further evaluation.  Severely diseased cavernous segment LEFT ICA in conjunction with a moderate stenosis of the proximal LEFT anterior cerebral. Potential exists for LEFT hemisphere ischemia.   Electronically Signed   By: Staci Righter M.D.   On: 11/28/2014 20:09   Mr Brain Wo Contrast  11/28/2014   CLINICAL DATA:  Two days of intermittent expressive aphasia, with RIGHT and/or LEFT hand numbness.  EXAM: MRI HEAD WITHOUT CONTRAST  MRA HEAD WITHOUT CONTRAST  TECHNIQUE: Multiplanar, multiecho pulse sequences of the brain and surrounding structures were obtained without intravenous contrast. Angiographic images of the head were obtained using MRA technique without contrast.  COMPARISON:  CT head earlier today.  FINDINGS: MRI HEAD FINDINGS  No evidence for acute infarction, hemorrhage, mass lesion, hydrocephalus, or extra-axial fluid. Normal cerebral and cerebellar volume. Mild subcortical and periventricular T2 and  FLAIR hyperintensities, likely chronic microvascular ischemic change.  Pituitary, pineal, and cerebellar tonsils unremarkable. No upper cervical lesions. LEFT carotid fissure perivascular space.  Flow voids are maintained throughout the RIGHT carotid, basilar, and vertebral arteries. The LEFT carotid appears of diminished caliber. There are no areas of chronic hemorrhage.  Visualized calvarium, skull base, and upper cervical osseous structures unremarkable. Scalp and extracranial soft tissues, orbits, sinuses, and mastoids show no acute process.  MRA HEAD FINDINGS  The RIGHT internal carotid artery is widely patent in its cervical, petrous, and inferior cavernous segment. There is a 75% stenosis of the distal cavernous and supraclinoid ICA, potentially flow reducing.  Small caliber of the LEFT ICA in its cervical and petrous segments. There is concern of a severe stenosis at the LEFT ICA origin or even within the LEFT common carotid artery. Consider carotid Doppler for further evaluation.  There is severely diseased inferior cavernous segment LEFT ICA with near complete loss of flow related enhancement. Superior cavernous segment is diseased but visible, and the supraclinoid LEFT ICA segment is small but probably otherwise normal, given the proximal flow reduction.  Basilar artery widely patent. Vertebrals codominant. No proximal stenosis of the middle, or posterior cerebral arteries. There is a proximal stenosis at the origin of the LEFT anterior cerebral artery, estimated 50-75%. The LEFT anterior and middle cerebral arteries are smaller due to diminished in flow.  Mild irregularity of the distal MCA and PCA branches suggesting intracranial atherosclerotic disease. The LEFT PICA and BILATERAL AICA vessels are poorly seen. No intracranial aneurysm.  IMPRESSION: No acute stroke is evident.  Mild small vessel disease.  Suspected flow reducing 75% stenosis of the distal cavernous and supraclinoid RIGHT ICA.  Diminished caliber of the cervical and petrous LEFT ICA, concern raised for proximal flow reducing lesion. Consider carotid Dopplers for further evaluation.  Severely diseased cavernous segment LEFT ICA in conjunction with a moderate stenosis of the proximal LEFT anterior cerebral. Potential exists for LEFT hemisphere ischemia.   Electronically Signed   By: Staci Righter M.D.   On: 11/28/2014 20:09   US Carotid Bilateral  11/29/2014   CLINICAL DATA:  59 year old female with symptoms of transient ischemic attack  EXAM: BILATERAL CAROTID DUPLEX ULTRASOUND  TECHNIQUE: Pearline Cables scale imaging, color Doppler and duplex ultrasound were performed of bilateral carotid and vertebral arteries in the neck.  COMPARISON:  Brain MRI/ MRA 11/28/2014  FINDINGS: Criteria: Quantification of carotid stenosis is based on velocity parameters that correlate the residual internal carotid diameter with NASCET-based stenosis levels, using the diameter of the distal internal carotid lumen as the denominator for stenosis measurement.  The following velocity measurements were obtained:  RIGHT  ICA:  219/65 cm/sec  CCA:  536/64 cm/sec  SYSTOLIC ICA/CCA RATIO:  1.7  DIASTOLIC ICA/CCA RATIO:  1.8  ECA:  213 cm/sec  LEFT  ICA:  233/52 cm/sec  CCA:  40/34 cm/sec  SYSTOLIC ICA/CCA RATIO:  2.8  DIASTOLIC ICA/CCA RATIO:  4.1  ECA:  Over 400 cm/sec  RIGHT CAROTID ARTERY: Heterogeneous atherosclerotic plaque beginning in the carotid bifurcation and extending into the proximal internal and external carotid arteries. Elevation of the peak systolic velocity in the external carotid arteries suggests some degree of stenosis although this does not exceed 60%. Elevation of the peak systolic velocity in the proximal internal carotid artery consistent with a 50- 69% diameter stenosis.  RIGHT VERTEBRAL ARTERY:  Patent with normal antegrade flow.  LEFT CAROTID ARTERY: Smooth heterogeneous atherosclerotic plaque in the mid common carotid artery without significant  velocity elevation. More heterogeneous atherosclerotic plaque is noted in the carotid bifurcation extending into the proximal internal and external carotid arteries. Significant elevation of the peak systolic velocity in the proximal external carotid artery consistent with a high-grade stenosis. There is associated aliasing and spectral broadening. Similarly, there is spectral broadening, aliasing and jetting in the proximal internal carotid artery. By peak systolic velocity criteria, the estimated stenosis is greater than 70%.  LEFT VERTEBRAL ARTERY:  Patent with normal antegrade flow.  IMPRESSION: 1. Moderate (50- 69%) stenosis proximal right internal carotid artery secondary to heterogeneous atherosclerotic plaque. 2. Severe (70- 99%) stenosis proximal left internal carotid artery secondary to heterogeneous atherosclerotic plaque. 3. Severe stenosis of the proximal left external carotid artery noted incidentally. 4. Smooth focal atherosclerotic plaque in the mid left common carotid artery without significant stenosis. 5. Vertebral arteries are patent with normal antegrade flow. Signed,  Criselda Peaches, MD  [facility]  [facility]   Electronically Signed   By: Jacqulynn Cadet M.D.   On: 11/29/2014 18:21    Medications:  Prior to Admission:  Prescriptions prior to admission  Medication Sig Dispense Refill Last Dose  . ALPRAZolam (XANAX) 1 MG tablet Take 0.5 mg by mouth 2 (two) times daily.    11/28/2014 at Unknown time  . aspirin EC 81 MG tablet Take 81 mg by mouth daily.     11/27/2014 at 2200  . dexlansoprazole (DEXILANT) 60 MG capsule Take 60 mg by mouth daily as needed (acid reflux).   unknown  . dicyclomine (BENTYL) 10 MG capsule Take 1 capsule (10 mg total) by mouth 4 (four) times daily -  before meals and at  bedtime. Take 1 capsule (10 mg total) by mouth 3 (three) times daily - before meals (Patient taking differently: Take 10 mg by mouth daily  as needed for spasms. Take 1 capsule (10 mg total) by mouth 3 (three) times daily - before meals) 90 capsule 2 unknown  . gabapentin (NEURONTIN) 300 MG capsule Take 300 mg by mouth 3 (three) times daily.   11/28/2014 at Unknown time  . metFORMIN (GLUCOPHAGE) 500 MG tablet Take 500 mg by mouth 2 (two) times daily with a meal.     11/28/2014 at Unknown time  . oxyCODONE (OXY IR/ROXICODONE) 5 MG immediate release tablet Take 5 mg by mouth every 4 (four) hours as needed for moderate pain.    11/28/2014 at Unknown time  . colesevelam (WELCHOL) 625 MG tablet Take 3,750 mg by mouth daily.   Taking  . DEXILANT 60 MG capsule take 1 capsule by mouth every morning (Patient not taking: Reported on 11/28/2014) 30 capsule 5 Not Taking at Unknown time  . lisinopril (PRINIVIL,ZESTRIL) 20 MG tablet Take 20 mg by mouth daily.   Taking  . phenazopyridine (PYRIDIUM) 200 MG tablet Take 1 tablet (200 mg total) by mouth 3 (three) times daily as needed for pain. (Patient not taking: Reported on 11/28/2014) 6 tablet 0 Not Taking at Unknown time   Scheduled: .  stroke: mapping our early stages of recovery book   Does not apply Once  . ALPRAZolam  0.5 mg Oral BID  . aspirin EC  325 mg Oral Daily  . atorvastatin  80 mg Oral q1800  . clopidogrel  75 mg Oral Daily  . enoxaparin (LOVENOX) injection  40 mg Subcutaneous Q24H  . gabapentin  300 mg Oral TID  . insulin aspart  0-9 Units Subcutaneous TID WC  . nicotine  21 mg Transdermal Daily  . pantoprazole  80 mg Oral Daily   Continuous:  IOX:[id number] **OR** acetaminophen, dicyclomine  Assesment: She had a TIA but she still has some numbness of her hand so she may have had a small stroke that we did not see on MRI. She has severe carotid vascular disease and is high risk for a very large stroke and so she needs surgery more urgently.  She has hypertension which is pretty well controlled.  She has diabetes and her sugar has been up  She has continued tobacco use and is  on a patch at this time  She had been on baby aspirin but now is on full dose aspirin plus Plavix Principal Problem:   TIA (transient ischemic attack) Active Problems:   DM type 2 (diabetes mellitus, type 2)   Tobacco use disorder    Plan: Discuss with hospitalist service in [place] and  Probable transfer to their service for vascular surgery consultation and surgery.    Kendalyn Cranfield L 11/30/2014, 8:15 AM

## 2014-12-01 ENCOUNTER — Encounter (HOSPITAL_COMMUNITY): Payer: Self-pay | Admitting: Radiology

## 2014-12-01 ENCOUNTER — Inpatient Hospital Stay (HOSPITAL_COMMUNITY): Payer: 59

## 2014-12-01 DIAGNOSIS — I6522 Occlusion and stenosis of left carotid artery: Secondary | ICD-10-CM

## 2014-12-01 DIAGNOSIS — E1149 Type 2 diabetes mellitus with other diabetic neurological complication: Secondary | ICD-10-CM

## 2014-12-01 DIAGNOSIS — I739 Peripheral vascular disease, unspecified: Secondary | ICD-10-CM

## 2014-12-01 DIAGNOSIS — E780 Pure hypercholesterolemia: Secondary | ICD-10-CM

## 2014-12-01 DIAGNOSIS — Z720 Tobacco use: Secondary | ICD-10-CM

## 2014-12-01 DIAGNOSIS — K219 Gastro-esophageal reflux disease without esophagitis: Secondary | ICD-10-CM

## 2014-12-01 DIAGNOSIS — I779 Disorder of arteries and arterioles, unspecified: Secondary | ICD-10-CM | POA: Diagnosis present

## 2014-12-01 DIAGNOSIS — G459 Transient cerebral ischemic attack, unspecified: Secondary | ICD-10-CM

## 2014-12-01 DIAGNOSIS — I10 Essential (primary) hypertension: Secondary | ICD-10-CM

## 2014-12-01 LAB — GLUCOSE, CAPILLARY
GLUCOSE-CAPILLARY: 186 mg/dL — AB (ref 65–99)
Glucose-Capillary: 207 mg/dL — ABNORMAL HIGH (ref 65–99)
Glucose-Capillary: 197 mg/dL — ABNORMAL HIGH (ref 65–99)
Glucose-Capillary: 272 mg/dL — ABNORMAL HIGH (ref 65–99)

## 2014-12-01 LAB — SURGICAL PCR SCREEN
MRSA, PCR: NEGATIVE
Staphylococcus aureus: POSITIVE — AB

## 2014-12-01 MED ORDER — FENOFIBRATE 54 MG PO TABS
54.0000 mg | ORAL_TABLET | Freq: Every day | ORAL | Status: DC
  Administered 2014-12-01 – 2014-12-03 (×2): 54 mg via ORAL
  Filled 2014-12-01 (×3): qty 1

## 2014-12-01 MED ORDER — CEFAZOLIN SODIUM 1-5 GM-% IV SOLN
1.0000 g | INTRAVENOUS | Status: AC
  Administered 2014-12-02: 2 g via INTRAVENOUS
  Filled 2014-12-01: qty 50

## 2014-12-01 MED ORDER — INSULIN GLARGINE 100 UNIT/ML ~~LOC~~ SOLN
5.0000 [IU] | Freq: Every day | SUBCUTANEOUS | Status: DC
  Administered 2014-12-01 – 2014-12-03 (×2): 5 [IU] via SUBCUTANEOUS
  Filled 2014-12-01 (×3): qty 0.05

## 2014-12-01 MED ORDER — IOHEXOL 350 MG/ML SOLN
50.0000 mL | Freq: Once | INTRAVENOUS | Status: AC | PRN
  Administered 2014-12-01: 50 mL via INTRAVENOUS

## 2014-12-01 MED ORDER — LISINOPRIL 10 MG PO TABS
10.0000 mg | ORAL_TABLET | Freq: Every day | ORAL | Status: DC
  Administered 2014-12-01 – 2014-12-03 (×2): 10 mg via ORAL
  Filled 2014-12-01 (×2): qty 1

## 2014-12-01 NOTE — Progress Notes (Signed)
[facility]                                                                              Patient Demographics  Grace Hess, is a 59 y.o. female, DOB - Jul 28, 1955, [id number]  Admit date - 11/28/2014   Admitting Physician Verlee Monte, MD  Outpatient Primary MD for the patient is Alonza Bogus, MD  LOS - 1   Chief Complaint  Patient presents with  . Weakness       Brief HPI  Per Dr. Sarajane Jews on 9/15 59 yow presented with hx of aphasia and right hand numbness on 9/14. Initial evaluation included CT head which was unremakable and she was referred for observation and stroke workup. Was driving home a day before admission noon when right hand went numb, lasted 45 minutes and resolved spontaneously. Had difficulty speaking, expressive aphasia same time lasting 5 minutes which resolved spontaneously. Didn't remember several miles of the trip and so pulled over and let niece drive home. On the morning of admission she had recurrent, intermittent hand and wrist numbness so came to ED. No aphasia. No leg weakness or falls. Reports several episodes of right arm sudden weakness over last several months, transient in nature. Reports "triple" vision for the last week. Patient was admitted for further workup.    Assessment & Plan    Principal Problem:   TIA (transient ischemic attack) presenting with right hand numbness, expressive aphasia - MRI of the brain showed no acute stroke - MRA showed 75% stenosis of the distal cavernous and supraclinoid right ICA, diminished caliber of the cervical and petrous left ICA. Severely diseased cavernous segment left ICA - Carotid Dopplers showed moderate 50-69% stenosis in the proximal right ICA, severe 70-99% stenosis proximal left ICA. - 2-D echo showed EF of 123456, grade 1 diastolic dysfunction. No PFO. - Patient was evaluated by neurology at the [facility], recommended dual in the platelet agents for 6 months, statin,  BP control, smoking cessation and diabetes control, vascular surgery evaluation. - Patient was transferred to [facility], seen by Dr. Trula Slade, recommended carotid endarterectomy, CT angiogram neck -Continue aspirin, Plavix   Active Problems:   HYPERCHOLESTEROLEMIA  IIA With hypertriglyceridemia - Lipid panelShowed cholesterol 303, triglycerides 967 - Patient placed on Lipitor 80 mg daily, should be on fenofibrate as well for triglycerides    Essential hypertension - Restart lisinopril, 10 mg daily    GERD Continue Protonix     DM type 2 (diabetes mellitus, type 2)Uncontrolled - Hemoglobin A1c 8.6  -Continue sliding scale insulin, Lantus and patient.  - Increase metformin to 1000 twice a day at the time of discharge      Tobacco use disorder - Patient counseled strongly on smoking cessation    Carotid artery disease - Seen by vascular surgery today, recommended CEA   Code Status:Full code   Family Communication: Discussed in detail with the patient, all imaging results, lab results explained to the patient    Disposition Plan: Awaiting vasc surgery  Time Spent in minutes   25 minutes  Procedures  MRI MRA brain   2-D echo Carotid Dopplers  Consults   Neurology Vascular surgery  DVT Prophylaxis   Lovenox   Medications  Scheduled Meds: . ALPRAZolam  0.5 mg Oral BID  . aspirin EC  325 mg Oral Daily  . atorvastatin  80 mg Oral q1800  . clopidogrel  75 mg Oral Daily  . enoxaparin (LOVENOX) injection  40 mg Subcutaneous Q24H  . gabapentin  300 mg Oral TID  . insulin aspart  0-9 Units Subcutaneous TID WC  . nicotine  21 mg Transdermal Daily  . pantoprazole  80 mg Oral Daily   Continuous Infusions:  PRN Meds:.acetaminophen **OR** acetaminophen, dicyclomine   Antibiotics   Anti-infectives    None        Subjective:   Grace Hess was seen and examined today.  Patient denies dizziness, chest pain, shortness of breath, abdominal pain, N/V/D/C,  new weakness, numbess, tingling. No acute events overnight.    Objective:   Blood pressure 143/48, pulse 66, temperature 97.1 F (36.2 C), temperature source Oral, resp. rate 18, height 5\' 6"  (1.676 m), weight 71.033 kg (156 lb 9.6 oz), SpO2 98 %.  Wt Readings from Last 3 Encounters:  11/28/14 71.033 kg (156 lb 9.6 oz)  11/28/14 70.761 kg (156 lb)  10/20/13 72.576 kg (160 lb)     Intake/Output Summary (Last 24 hours) at 12/01/14 1123 Last data filed at 11/30/14 1800  Gross per 24 hour  Intake    240 ml  Output      0 ml  Net    240 ml    Exam  General: Alert and oriented x 3, NAD  HEENT:  PERRLA, EOMI, Anicteric Sclera, mucous membranes moist.   Neck: Supple, no JVD, no masses  CVS: S1 S2 auscultated, no rubs, murmurs or gallops. Regular rate and rhythm.  Respiratory: Clear to auscultation bilaterally, no wheezing, rales or rhonchi  Abdomen: Soft, nontender, nondistended, + bowel sounds  Ext: no cyanosis clubbing or edema  Neuro: AAOx3, Cr N's II- XII. Strength 5/5 upper and lower extremities bilaterally  Skin: No rashes  Psych: Normal affect and demeanor, alert and oriented x3    Data Review   Micro Results No results found for this or any previous visit (from the past 240 hour(s)).  Radiology Reports Ct Head Wo Contrast  11/28/2014   CLINICAL DATA:  Weakness right hand, slurred speech  EXAM: CT HEAD WITHOUT CONTRAST  TECHNIQUE: Contiguous axial images were obtained from the base of the skull through the vertex without intravenous contrast.  COMPARISON:  03/18/2011  FINDINGS: No skull fracture is noted. Paranasal sinuses and mastoid air cells are unremarkable.  No intracranial hemorrhage, mass effect or midline shift. No definite acute cortical infarction. Stable left periventricular white matter decreased attenuation adjacent to frontal horn. Stable small lacunar infarct in left basal ganglia.  No mass lesion is noted on this unenhanced scan. There is no  hydrocephalus.  IMPRESSION: No acute intracranial abnormality. No definite acute cortical infarction. Stable chronic findings as described above.   Electronically Signed   By: Lahoma Crocker M.D.   On: 11/28/2014 13:39   Ct Angio Neck W/cm &/or Wo/cm  12/01/2014   CLINICAL DATA:  Speech disturbance 5 days ago. New onset of bilateral hand tingling. Abnormal MRI and MRA a.  EXAM: CT ANGIOGRAPHY NECK  TECHNIQUE: Multidetector CT imaging of the neck was performed using the standard protocol during bolus administration of intravenous contrast. Multiplanar CT image reconstructions and MIPs were obtained to evaluate the vascular anatomy. Carotid stenosis measurements (when  applicable) are obtained utilizing NASCET criteria, using the distal internal carotid diameter as the denominator.  CONTRAST:  26mL OMNIPAQUE IOHEXOL 350 MG/ML SOLN  COMPARISON:  MRI studies 11/28/2014.  Noninvasive study 11/29/2014.  FINDINGS: Aortic arch: Ordinary atherosclerosis of the arch without aneurysm or dissection. Branching pattern of the brachiocephalic vessels from the arch is normal. No flow-limiting origin stenosis. 30% stenosis of the proximal left common carotid artery.  Right carotid system: Common carotid artery is widely patent to the bifurcation region. There is atherosclerotic plaque of the distal 1 cm of the common carotid artery narrowing the lumen to a diameter of 4.5 mm compared to a more proximal luminal diameter of 5.8 mm. Atherosclerotic disease at the carotid bifurcation but without stenosis or irregularity of the proximal ICA. Beyond that, the cervical internal carotid artery is normal.  Left carotid system: Common carotid artery shows atherosclerotic disease along its course with narrowing several cm proximal to the bifurcation due to soft plaque. Minimal diameter is 3 mm compared to an expected diameter of 5.8 mm. At the carotid bifurcation, there is extensive soft and calcified plaque. Proximal ICA is narrowed to a  diameter of 1 mm or less. Compared to a more distal cervical ICA diameter of 4 mm, this would indicate a 75% stenosis. However, the more distal cervical ICA diameter is likely reduced because of diminished inflow. This is likely an 80- 90% or greater stenosis of the proximal left ICA. Beyond that, the cervical internal carotid artery is patent.  Vertebral arteries:Both vertebral artery origins are patent. The vertebral arteries are approximately equal in size. No stenosis of the right vertebral artery origin. 30% stenosis of the left vertebral artery origin. Beyond that, the vessels are widely patent through the cervical region and both reach the basilar artery.  Skeleton: Ordinary spondylosis without significant lesion  Other neck: No mass or lymphadenopathy. Scans in the upper chest are unremarkable.  IMPRESSION: Non stenotic atherosclerosis of the carotid bifurcation on the right. Atherosclerosis of the distal common carotid artery with narrowing of that vessel of about 25% in that location.  Advanced atherosclerotic disease at the carotid bifurcation on the left with focal stenosis of the proximal internal carotid artery with a luminal diameter of 1 mm or less. This stenosis measures 75% when compared to the more distal cervical ICA, but the vessel is reduced in size because of diminished inflow. Therefore, the stenosis is likely 80-90% or greater. There is also a 40-50% stenosis of the left common carotid artery 3 cm proximal to the bifurcation.  30% stenosis of the left common carotid artery at its origin from the arch.  30% stenosis of the left vertebral artery origin.   Electronically Signed   By: Nelson Chimes M.D.   On: 12/01/2014 11:04   Mr Jodene Nam Head Wo Contrast  11/28/2014   CLINICAL DATA:  Two days of intermittent expressive aphasia, with RIGHT and/or LEFT hand numbness.  EXAM: MRI HEAD WITHOUT CONTRAST  MRA HEAD WITHOUT CONTRAST  TECHNIQUE: Multiplanar, multiecho pulse sequences of the brain and  surrounding structures were obtained without intravenous contrast. Angiographic images of the head were obtained using MRA technique without contrast.  COMPARISON:  CT head earlier today.  FINDINGS: MRI HEAD FINDINGS  No evidence for acute infarction, hemorrhage, mass lesion, hydrocephalus, or extra-axial fluid. Normal cerebral and cerebellar volume. Mild subcortical and periventricular T2 and FLAIR hyperintensities, likely chronic microvascular ischemic change.  Pituitary, pineal, and cerebellar tonsils unremarkable. No upper cervical lesions. LEFT carotid fissure  perivascular space.  Flow voids are maintained throughout the RIGHT carotid, basilar, and vertebral arteries. The LEFT carotid appears of diminished caliber. There are no areas of chronic hemorrhage.  Visualized calvarium, skull base, and upper cervical osseous structures unremarkable. Scalp and extracranial soft tissues, orbits, sinuses, and mastoids show no acute process.  MRA HEAD FINDINGS  The RIGHT internal carotid artery is widely patent in its cervical, petrous, and inferior cavernous segment. There is a 75% stenosis of the distal cavernous and supraclinoid ICA, potentially flow reducing.  Small caliber of the LEFT ICA in its cervical and petrous segments. There is concern of a severe stenosis at the LEFT ICA origin or even within the LEFT common carotid artery. Consider carotid Doppler for further evaluation.  There is severely diseased inferior cavernous segment LEFT ICA with near complete loss of flow related enhancement. Superior cavernous segment is diseased but visible, and the supraclinoid LEFT ICA segment is small but probably otherwise normal, given the proximal flow reduction.  Basilar artery widely patent. Vertebrals codominant. No proximal stenosis of the middle, or posterior cerebral arteries. There is a proximal stenosis at the origin of the LEFT anterior cerebral artery, estimated 50-75%. The LEFT anterior and middle cerebral  arteries are smaller due to diminished in flow.  Mild irregularity of the distal MCA and PCA branches suggesting intracranial atherosclerotic disease. The LEFT PICA and BILATERAL AICA vessels are poorly seen. No intracranial aneurysm.  IMPRESSION: No acute stroke is evident.  Mild small vessel disease.  Suspected flow reducing 75% stenosis of the distal cavernous and supraclinoid RIGHT ICA.  Diminished caliber of the cervical and petrous LEFT ICA, concern raised for proximal flow reducing lesion. Consider carotid Dopplers for further evaluation.  Severely diseased cavernous segment LEFT ICA in conjunction with a moderate stenosis of the proximal LEFT anterior cerebral. Potential exists for LEFT hemisphere ischemia.   Electronically Signed   By: Staci Righter M.D.   On: 11/28/2014 20:09   Mr Brain Wo Contrast  11/28/2014   CLINICAL DATA:  Two days of intermittent expressive aphasia, with RIGHT and/or LEFT hand numbness.  EXAM: MRI HEAD WITHOUT CONTRAST  MRA HEAD WITHOUT CONTRAST  TECHNIQUE: Multiplanar, multiecho pulse sequences of the brain and surrounding structures were obtained without intravenous contrast. Angiographic images of the head were obtained using MRA technique without contrast.  COMPARISON:  CT head earlier today.  FINDINGS: MRI HEAD FINDINGS  No evidence for acute infarction, hemorrhage, mass lesion, hydrocephalus, or extra-axial fluid. Normal cerebral and cerebellar volume. Mild subcortical and periventricular T2 and FLAIR hyperintensities, likely chronic microvascular ischemic change.  Pituitary, pineal, and cerebellar tonsils unremarkable. No upper cervical lesions. LEFT carotid fissure perivascular space.  Flow voids are maintained throughout the RIGHT carotid, basilar, and vertebral arteries. The LEFT carotid appears of diminished caliber. There are no areas of chronic hemorrhage.  Visualized calvarium, skull base, and upper cervical osseous structures unremarkable. Scalp and extracranial  soft tissues, orbits, sinuses, and mastoids show no acute process.  MRA HEAD FINDINGS  The RIGHT internal carotid artery is widely patent in its cervical, petrous, and inferior cavernous segment. There is a 75% stenosis of the distal cavernous and supraclinoid ICA, potentially flow reducing.  Small caliber of the LEFT ICA in its cervical and petrous segments. There is concern of a severe stenosis at the LEFT ICA origin or even within the LEFT common carotid artery. Consider carotid Doppler for further evaluation.  There is severely diseased inferior cavernous segment LEFT ICA with near complete loss  of flow related enhancement. Superior cavernous segment is diseased but visible, and the supraclinoid LEFT ICA segment is small but probably otherwise normal, given the proximal flow reduction.  Basilar artery widely patent. Vertebrals codominant. No proximal stenosis of the middle, or posterior cerebral arteries. There is a proximal stenosis at the origin of the LEFT anterior cerebral artery, estimated 50-75%. The LEFT anterior and middle cerebral arteries are smaller due to diminished in flow.  Mild irregularity of the distal MCA and PCA branches suggesting intracranial atherosclerotic disease. The LEFT PICA and BILATERAL AICA vessels are poorly seen. No intracranial aneurysm.  IMPRESSION: No acute stroke is evident.  Mild small vessel disease.  Suspected flow reducing 75% stenosis of the distal cavernous and supraclinoid RIGHT ICA.  Diminished caliber of the cervical and petrous LEFT ICA, concern raised for proximal flow reducing lesion. Consider carotid Dopplers for further evaluation.  Severely diseased cavernous segment LEFT ICA in conjunction with a moderate stenosis of the proximal LEFT anterior cerebral. Potential exists for LEFT hemisphere ischemia.   Electronically Signed   By: Staci Righter M.D.   On: 11/28/2014 20:09   US Carotid Bilateral  11/29/2014   CLINICAL DATA:  59 year old female with symptoms of  transient ischemic attack  EXAM: BILATERAL CAROTID DUPLEX ULTRASOUND  TECHNIQUE: Pearline Cables scale imaging, color Doppler and duplex ultrasound were performed of bilateral carotid and vertebral arteries in the neck.  COMPARISON:  Brain MRI/ MRA 11/28/2014  FINDINGS: Criteria: Quantification of carotid stenosis is based on velocity parameters that correlate the residual internal carotid diameter with NASCET-based stenosis levels, using the diameter of the distal internal carotid lumen as the denominator for stenosis measurement.  The following velocity measurements were obtained:  RIGHT  ICA:  219/65 cm/sec  CCA:  Q000111Q cm/sec  SYSTOLIC ICA/CCA RATIO:  1.7  DIASTOLIC ICA/CCA RATIO:  1.8  ECA:  213 cm/sec  LEFT  ICA:  233/52 cm/sec  CCA:  123456 cm/sec  SYSTOLIC ICA/CCA RATIO:  2.8  DIASTOLIC ICA/CCA RATIO:  4.1  ECA:  Over 400 cm/sec  RIGHT CAROTID ARTERY: Heterogeneous atherosclerotic plaque beginning in the carotid bifurcation and extending into the proximal internal and external carotid arteries. Elevation of the peak systolic velocity in the external carotid arteries suggests some degree of stenosis although this does not exceed 60%. Elevation of the peak systolic velocity in the proximal internal carotid artery consistent with a 50- 69% diameter stenosis.  RIGHT VERTEBRAL ARTERY:  Patent with normal antegrade flow.  LEFT CAROTID ARTERY: Smooth heterogeneous atherosclerotic plaque in the mid common carotid artery without significant velocity elevation. More heterogeneous atherosclerotic plaque is noted in the carotid bifurcation extending into the proximal internal and external carotid arteries. Significant elevation of the peak systolic velocity in the proximal external carotid artery consistent with a high-grade stenosis. There is associated aliasing and spectral broadening. Similarly, there is spectral broadening, aliasing and jetting in the proximal internal carotid artery. By peak systolic velocity criteria, the  estimated stenosis is greater than 70%.  LEFT VERTEBRAL ARTERY:  Patent with normal antegrade flow.  IMPRESSION: 1. Moderate (50- 69%) stenosis proximal right internal carotid artery secondary to heterogeneous atherosclerotic plaque. 2. Severe (70- 99%) stenosis proximal left internal carotid artery secondary to heterogeneous atherosclerotic plaque. 3. Severe stenosis of the proximal left external carotid artery noted incidentally. 4. Smooth focal atherosclerotic plaque in the mid left common carotid artery without significant stenosis. 5. Vertebral arteries are patent with normal antegrade flow. Signed,  Criselda Peaches, MD  [facility]  [facility]   Electronically Signed   By: Jacqulynn Cadet M.D.   On: 11/29/2014 18:21    CBC  Recent Labs Lab 11/28/14 1240 11/28/14 1253  WBC 12.9*  --   HGB 14.1 15.6*  HCT 41.8 46.0  PLT 256  --   MCV 91.5  --   MCH 30.9  --   MCHC 33.7  --   RDW 13.7  --   LYMPHSABS 3.7  --   MONOABS 0.7  --   EOSABS 0.3  --   BASOSABS 0.0  --     Chemistries   Recent Labs Lab 11/28/14 1240 11/28/14 1253  NA 136 137  K 4.6 4.7  CL 103 104  CO2 22  --   GLUCOSE 259* 261*  BUN 24* 25*  CREATININE 0.90 0.80  CALCIUM 9.1  --   AST 20  --   ALT 18  --   ALKPHOS 64  --   BILITOT 0.5  --    ------------------------------------------------------------------------------------------------------------------ estimated creatinine clearance is 70.9 mL/min (by C-G formula based on Cr of 0.8). ------------------------------------------------------------------------------------------------------------------  Recent Labs  11/28/14 1240  HGBA1C 8.6*   ------------------------------------------------------------------------------------------------------------------  Recent Labs  11/29/14 0630  CHOL 303*  HDL 30*  LDLCALC UNABLE TO CALCULATE IF TRIGLYCERIDE OVER 400 mg/dL  TRIG 967*  CHOLHDL 10.1    ------------------------------------------------------------------------------------------------------------------ No results for input(s): TSH, T4TOTAL, T3FREE, THYROIDAB in the last 72 hours.  Invalid input(s): FREET3 ------------------------------------------------------------------------------------------------------------------ No results for input(s): VITAMINB12, FOLATE, FERRITIN, TIBC, IRON, RETICCTPCT in the last 72 hours.  Coagulation profile  Recent Labs Lab 11/28/14 1240  INR 0.99    No results for input(s): DDIMER in the last 72 hours.  Cardiac Enzymes No results for input(s): CKMB, TROPONINI, MYOGLOBIN in the last 168 hours.  Invalid input(s): CK ------------------------------------------------------------------------------------------------------------------ Invalid input(s): [place]  11/29/14 2120 11/30/14 0733 11/30/14 1148 11/30/14 1622 11/30/14 2212 12/01/14 0655  GLUCAP 327* 204* 204* 218* 204* 207*     Jaquin Coy M.D. [facility] 12/01/2014, 11:23 AM  Pager: [phone] Between 7am to 7pm - call Pager - [phone]  After 7pm go to [email or web address] - password [email or web address]  Call night coverage person covering after 7pm

## 2014-12-01 NOTE — Consult Note (Signed)
Full note to follow Plan CTA today CEA tomorrow  Annamarie Major

## 2014-12-01 NOTE — Consult Note (Signed)
Consult Note  Patient name: STACEE GILDNER MRN: [id number] DOB: [birth year] Sex: female  Consulting Physician:  Hospital service  Reason for Consult:  Chief Complaint  Patient presents with  . Weakness    HISTORY OF PRESENT ILLNESS: This is a 59 year old female who presented to [facility] on 9/14 with an episode of right hand numbness and dysarthria.  This episode lasted approximately 45 minutes and then resolved.  She states that she cannot account for approximately 10 miles worth of driving.  She also notes several months ago while reaching for something her right arm dropped and she had no control over it.  This was very brief.  She underwent a MRI which was negative for an acute stroke there was disease within the cavernous left ICA and proximal left anterior cerebral artery.  [facility] a CT angiogram that shows a 75%, likely 80-90 percent left carotid stenosis.  She has a small amount of residual numbness in her right arm but no weakness.  She was started on Plavix on 916.  She had previously been taking aspirin.  The patient suffers from diabetes which is poorly controlled.  Her most recent hemoglobin A1c was 8.6 she takes a statin for hyper cholesterolemia.  She is on ACE inhibitor for blood pressure.  She continues to smoke about 2 packs per day.  Past Medical History  Diagnosis Date  . Diabetes mellitus   . Hypercholesteremia   . Obesity   . Chest pain, unspecified   . Reflux esophagitis   . Cervicalgia     Past Surgical History  Procedure Laterality Date  . Cholecystectomy    . Partial hysterectomy      Social History   Social History  . Marital Status: Married    Spouse Name: N/A  . Number of Children: N/A  . Years of Education: N/A   Occupational History  . Not on file.   Social History Main Topics  . Smoking status: Current Every Day Smoker -- 2.00 packs/day    Types: Cigarettes  . Smokeless tobacco: Not on file   Comment: 1 1/2 pack a day. Smoking since age 78  . Alcohol Use: No  . Drug Use: No  . Sexual Activity: Not on file   Other Topics Concern  . Not on file   Social History Narrative    Family History  Problem Relation Age of Onset  . COPD Father   . Congestive Heart Failure Mother   . Cancer Brother     Allergies as of 11/28/2014 - Review Complete 11/28/2014  Allergen Reaction Noted  . Codeine Shortness Of Breath 03/18/2011  . Sulfa antibiotics  03/18/2011    No current facility-administered medications on file prior to encounter.   Current Outpatient Prescriptions on File Prior to Encounter  Medication Sig Dispense Refill  . ALPRAZolam (XANAX) 1 MG tablet Take 0.5 mg by mouth 2 (two) times daily.     Marland Kitchen aspirin EC 81 MG tablet Take 81 mg by mouth daily.      Marland Kitchen dicyclomine (BENTYL) 10 MG capsule Take 1 capsule (10 mg total) by mouth 4 (four) times daily -  before meals and at bedtime. Take 1 capsule (10 mg total) by mouth 3 (three) times daily - before meals (Patient taking differently: Take 10 mg by mouth daily as needed for spasms. Take 1 capsule (10 mg total) by mouth 3 (three) times daily - before meals) 90  capsule 2  . gabapentin (NEURONTIN) 300 MG capsule Take 300 mg by mouth 3 (three) times daily.    . metFORMIN (GLUCOPHAGE) 500 MG tablet Take 500 mg by mouth 2 (two) times daily with a meal.      . oxyCODONE (OXY IR/ROXICODONE) 5 MG immediate release tablet Take 5 mg by mouth every 4 (four) hours as needed for moderate pain.     . colesevelam (WELCHOL) 625 MG tablet Take 3,750 mg by mouth daily.    Marland Kitchen DEXILANT 60 MG capsule take 1 capsule by mouth every morning (Patient not taking: Reported on 11/28/2014) 30 capsule 5  . lisinopril (PRINIVIL,ZESTRIL) 20 MG tablet Take 20 mg by mouth daily.    . phenazopyridine (PYRIDIUM) 200 MG tablet Take 1 tablet (200 mg total) by mouth 3 (three) times daily as needed for pain. (Patient not taking: Reported on 11/28/2014) 6 tablet 0      REVIEW OF SYSTEMS: See history of present illness, otherwise negative  PHYSICAL EXAMINATION: General: The patient appears their stated age.  Vital signs are BP 143/48 mmHg  Pulse 66  Temp(Src) 97.1 F (36.2 C) (Oral)  Resp 18  Ht 5\' 6"  (1.676 m)  Wt 156 lb 9.6 oz (71.033 kg)  BMI 25.29 kg/m2  SpO2 98% Pulmonary: Respirations are non-labored HEENT:  No gross abnormalities Abdomen: Soft and non-tender  Musculoskeletal: There are no major deformities.   Neurologic: No focal weakness detected, mild decrease in sensation on the right arm  Skin: There are no ulcer or rashes noted. Psychiatric: The patient has normal affect. Cardiovascular: There is a regular rate and rhythm without significant murmur appreciated.  Diagnostic Studies: I have reviewed her CT angiogram which has the following findings: Non stenotic atherosclerosis of the carotid bifurcation on the right. Atherosclerosis of the distal common carotid artery with narrowing of that vessel of about 25% in that location.  Advanced atherosclerotic disease at the carotid bifurcation on the left with focal stenosis of the proximal internal carotid artery with a luminal diameter of 1 mm or less. This stenosis measures 75% when compared to the more distal cervical ICA, but the vessel is reduced in size because of diminished inflow. Therefore, the stenosis is likely 80-90% or greater. There is also a 40-50% stenosis of the left common carotid artery 3 cm proximal to the bifurcation.  30% stenosis of the left common carotid artery at its origin from the arch.  30% stenosis of the left vertebral artery origin.  I have reviewed her MRI which has the following findings: No acute stroke is evident. Mild small vessel disease.  Suspected flow reducing 75% stenosis of the distal cavernous and supraclinoid RIGHT ICA.  Diminished caliber of the cervical and petrous LEFT ICA, concern raised for proximal flow reducing  lesion. Consider carotid Dopplers for further evaluation.  Severely diseased cavernous segment LEFT ICA in conjunction with a moderate stenosis of the proximal LEFT anterior cerebral. Potential exists for LEFT hemisphere ischemia   Assessment:  Symptomatic left carotid stenosis Plan: The patient is currently on aspirin and Plavix.  Her Plavix was just started 2 days ago.  Most likely explanation for her TIA like symptoms which consisted of dysarthria and right arm numbness is her left carotid stenosis.  Therefore I have recommended proceeding with left carotid endarterectomy.  I told her that this will be performed by my partner Dr. early tomorrow.  She will need to be nothing by mouth after midnight.  We discussed the risks and benefits  of the operation which include but are not limited to bleeding, stroke, and nerve injury.  We also discussed the possibility of recurrent stenosis.  She understands and wishes to proceed.     Eldridge Abrahams, M.D. [facility] [place] Office: [phone] Pager:  [phone]

## 2014-12-01 NOTE — Progress Notes (Signed)
Spoke again to patient and family after CTA resulted.  Again discussed risks and benefits of surgery.  They all wish to proceed.  Grace Hess

## 2014-12-02 ENCOUNTER — Inpatient Hospital Stay (HOSPITAL_COMMUNITY): Payer: 59 | Admitting: Anesthesiology

## 2014-12-02 ENCOUNTER — Encounter (HOSPITAL_COMMUNITY): Payer: Self-pay | Admitting: Anesthesiology

## 2014-12-02 ENCOUNTER — Encounter (HOSPITAL_COMMUNITY)
Admission: EM | Disposition: A | Discharge status: Home / Self Care | Payer: Self-pay | Source: Home / Self Care | Attending: Pulmonary Disease

## 2014-12-02 HISTORY — PX: ENDARTERECTOMY: SHX5162

## 2014-12-02 LAB — TYPE AND SCREEN
ABO/RH(D): A POS
ANTIBODY SCREEN: NEGATIVE

## 2014-12-02 LAB — ABO/RH: ABO/RH(D): A POS

## 2014-12-02 LAB — BASIC METABOLIC PANEL
ANION GAP: 9 (ref 5–15)
BUN: 24 mg/dL — AB (ref 6–20)
CALCIUM: 9.4 mg/dL (ref 8.9–10.3)
CO2: 25 mmol/L (ref 22–32)
Chloride: 100 mmol/L — ABNORMAL LOW (ref 101–111)
Creatinine, Ser: 1.08 mg/dL — ABNORMAL HIGH (ref 0.44–1.00)
GFR calc Af Amer: >60 mL/min (ref >60)
GFR, EST NON AFRICAN AMERICAN: 55 mL/min — AB (ref >60)
GLUCOSE: 223 mg/dL — AB (ref 65–99)
Potassium: 4.5 mmol/L (ref 3.5–5.1)
Sodium: 134 mmol/L — ABNORMAL LOW (ref 135–145)

## 2014-12-02 LAB — POCT I-STAT GLUCOSE
GLUCOSE: 204 mg/dL — AB (ref 65–99)
GLUCOSE: 172 mg/dL — AB (ref 65–99)
OPERATOR ID: 282221
Operator id: 282221

## 2014-12-02 LAB — CBC
HCT: 41.4 % (ref 36.0–46.0)
Hemoglobin: 13.6 g/dL (ref 12.0–15.0)
MCH: 29.7 pg (ref 26.0–34.0)
MCHC: 32.9 g/dL (ref 30.0–36.0)
MCV: 90.4 fL (ref 78.0–100.0)
PLATELETS: 211 10*3/uL (ref 150–400)
RBC: 4.58 MIL/uL (ref 3.87–5.11)
RDW: 13.6 % (ref 11.5–15.5)
WBC: 8.4 10*3/uL (ref 4.0–10.5)

## 2014-12-02 LAB — GLUCOSE, CAPILLARY
GLUCOSE-CAPILLARY: 203 mg/dL — AB (ref 65–99)
GLUCOSE-CAPILLARY: 177 mg/dL — AB (ref 65–99)
GLUCOSE-CAPILLARY: 236 mg/dL — AB (ref 65–99)
Glucose-Capillary: 147 mg/dL — ABNORMAL HIGH (ref 65–99)
Glucose-Capillary: 149 mg/dL — ABNORMAL HIGH (ref 65–99)
Glucose-Capillary: 245 mg/dL — ABNORMAL HIGH (ref 65–99)

## 2014-12-02 SURGERY — ENDARTERECTOMY, CAROTID
Anesthesia: General | Laterality: Left

## 2014-12-02 MED ORDER — FENTANYL CITRATE (PF) 100 MCG/2ML IJ SOLN
INTRAMUSCULAR | Status: AC
  Filled 2014-12-02: qty 2

## 2014-12-02 MED ORDER — PROPOFOL 10 MG/ML IV BOLUS
INTRAVENOUS | Status: DC | PRN
  Administered 2014-12-02: 100 mg via INTRAVENOUS
  Administered 2014-12-02 (×2): 50 mg via INTRAVENOUS

## 2014-12-02 MED ORDER — HEPARIN SODIUM (PORCINE) 1000 UNIT/ML IJ SOLN
INTRAMUSCULAR | Status: AC
  Filled 2014-12-02: qty 1

## 2014-12-02 MED ORDER — LACTATED RINGERS IV SOLN
INTRAVENOUS | Status: DC | PRN
  Administered 2014-12-02 (×2): via INTRAVENOUS

## 2014-12-02 MED ORDER — OXYCODONE HCL 5 MG PO TABS
ORAL_TABLET | ORAL | Status: AC
  Filled 2014-12-02: qty 1

## 2014-12-02 MED ORDER — GLYCOPYRROLATE 0.2 MG/ML IJ SOLN
INTRAMUSCULAR | Status: AC
Start: 2014-12-02 — End: 2014-12-02
  Filled 2014-12-02: qty 3

## 2014-12-02 MED ORDER — ONDANSETRON HCL 4 MG/2ML IJ SOLN
INTRAMUSCULAR | Status: DC | PRN
  Administered 2014-12-02 (×2): 4 mg via INTRAVENOUS

## 2014-12-02 MED ORDER — ONDANSETRON HCL 4 MG/2ML IJ SOLN
INTRAMUSCULAR | Status: AC
  Filled 2014-12-02: qty 2

## 2014-12-02 MED ORDER — GLYCOPYRROLATE 0.2 MG/ML IJ SOLN
INTRAMUSCULAR | Status: AC
  Filled 2014-12-02: qty 1

## 2014-12-02 MED ORDER — ESMOLOL HCL 10 MG/ML IV SOLN
INTRAVENOUS | Status: DC | PRN
  Administered 2014-12-02 (×2): 5 mg via INTRAVENOUS
  Administered 2014-12-02 (×2): 20 mg via INTRAVENOUS
  Administered 2014-12-02: 15 mg via INTRAVENOUS

## 2014-12-02 MED ORDER — ESMOLOL HCL 10 MG/ML IV SOLN
INTRAVENOUS | Status: AC
  Filled 2014-12-02: qty 10

## 2014-12-02 MED ORDER — ALUM & MAG HYDROXIDE-SIMETH 200-200-20 MG/5ML PO SUSP: 15.0000 mL | ORAL | Status: DC | PRN

## 2014-12-02 MED ORDER — PHENYLEPHRINE 40 MCG/ML (10ML) SYRINGE FOR IV PUSH (FOR BLOOD PRESSURE SUPPORT)
PREFILLED_SYRINGE | INTRAVENOUS | Status: AC
  Filled 2014-12-02: qty 10

## 2014-12-02 MED ORDER — PROPOFOL 10 MG/ML IV BOLUS
INTRAVENOUS | Status: AC
  Filled 2014-12-02: qty 20

## 2014-12-02 MED ORDER — LIDOCAINE HCL (CARDIAC) 20 MG/ML IV SOLN
INTRAVENOUS | Status: DC | PRN
  Administered 2014-12-02: 80 mg via INTRAVENOUS

## 2014-12-02 MED ORDER — 0.9 % SODIUM CHLORIDE (POUR BTL) OPTIME
TOPICAL | Status: DC | PRN
  Administered 2014-12-02: 2000 mL

## 2014-12-02 MED ORDER — METOPROLOL TARTRATE 1 MG/ML IV SOLN: 2.0000 mg | INTRAVENOUS | Status: DC | PRN

## 2014-12-02 MED ORDER — LABETALOL HCL 5 MG/ML IV SOLN: 10.0000 mg | INTRAVENOUS | Status: DC | PRN

## 2014-12-02 MED ORDER — ROCURONIUM BROMIDE 100 MG/10ML IV SOLN
INTRAVENOUS | Status: DC | PRN
  Administered 2014-12-02: 40 mg via INTRAVENOUS

## 2014-12-02 MED ORDER — SODIUM CHLORIDE 0.9 % IV SOLN
500.0000 mL | Freq: Once | INTRAVENOUS | Status: DC | PRN
Start: 2014-12-02 — End: 2014-12-03

## 2014-12-02 MED ORDER — CHLORHEXIDINE GLUCONATE CLOTH 2 % EX PADS
6.0000 | MEDICATED_PAD | Freq: Every day | CUTANEOUS | Status: DC
  Administered 2014-12-03: 6 via TOPICAL

## 2014-12-02 MED ORDER — INSULIN ASPART 100 UNIT/ML IV SOLN
8.0000 [IU] | INTRAVENOUS | Status: DC
  Filled 2014-12-02: qty 0.08

## 2014-12-02 MED ORDER — FENTANYL CITRATE (PF) 100 MCG/2ML IJ SOLN
25.0000 ug | INTRAMUSCULAR | Status: DC | PRN
  Administered 2014-12-02: 50 ug via INTRAVENOUS
  Administered 2014-12-02: 25 ug via INTRAVENOUS
  Administered 2014-12-02: 50 ug via INTRAVENOUS
  Administered 2014-12-02: 25 ug via INTRAVENOUS

## 2014-12-02 MED ORDER — ACETAMINOPHEN 325 MG PO TABS
325.0000 mg | ORAL_TABLET | ORAL | Status: DC | PRN
  Administered 2014-12-02: 650 mg via ORAL

## 2014-12-02 MED ORDER — DOCUSATE SODIUM 100 MG PO CAPS
100.0000 mg | ORAL_CAPSULE | Freq: Every day | ORAL | Status: DC
  Filled 2014-12-02: qty 1

## 2014-12-02 MED ORDER — SODIUM CHLORIDE 0.9 % IV SOLN
0.0125 ug/kg/min | INTRAVENOUS | Status: DC
  Filled 2014-12-02: qty 1000

## 2014-12-02 MED ORDER — MAGNESIUM SULFATE 2 GM/50ML IV SOLN: 2.0000 g | Freq: Every day | INTRAVENOUS | Status: DC | PRN

## 2014-12-02 MED ORDER — LIDOCAINE HCL (PF) 1 % IJ SOLN
INTRAMUSCULAR | Status: AC
  Filled 2014-12-02: qty 30

## 2014-12-02 MED ORDER — ONDANSETRON HCL 4 MG/2ML IJ SOLN: 4.0000 mg | Freq: Four times a day (QID) | INTRAMUSCULAR | Status: DC | PRN

## 2014-12-02 MED ORDER — INSULIN ASPART 100 UNIT/ML IV SOLN
4.0000 [IU] | INTRAVENOUS | Status: DC
  Filled 2014-12-02: qty 0.04

## 2014-12-02 MED ORDER — GLYCOPYRROLATE 0.2 MG/ML IJ SOLN
INTRAMUSCULAR | Status: DC | PRN
  Administered 2014-12-02 (×2): 0.1 mg via INTRAVENOUS
  Administered 2014-12-02: 0.4 mg via INTRAVENOUS

## 2014-12-02 MED ORDER — ONDANSETRON HCL 4 MG/2ML IJ SOLN
INTRAMUSCULAR | Status: AC
Start: 2014-12-02 — End: 2014-12-02
  Filled 2014-12-02: qty 2

## 2014-12-02 MED ORDER — SODIUM CHLORIDE 0.9 % IV SOLN: INTRAVENOUS | Status: DC

## 2014-12-02 MED ORDER — NEOSTIGMINE METHYLSULFATE 10 MG/10ML IV SOLN
INTRAVENOUS | Status: AC
  Filled 2014-12-02: qty 1

## 2014-12-02 MED ORDER — FENTANYL CITRATE (PF) 100 MCG/2ML IJ SOLN
INTRAMUSCULAR | Status: DC | PRN
  Administered 2014-12-02 (×2): 50 ug via INTRAVENOUS

## 2014-12-02 MED ORDER — POTASSIUM CHLORIDE CRYS ER 20 MEQ PO TBCR: 20.0000 meq | EXTENDED_RELEASE_TABLET | Freq: Every day | ORAL | Status: DC | PRN

## 2014-12-02 MED ORDER — SODIUM CHLORIDE 0.9 % IJ SOLN
INTRAMUSCULAR | Status: AC
  Filled 2014-12-02: qty 10

## 2014-12-02 MED ORDER — CEFAZOLIN SODIUM-DEXTROSE 2-3 GM-% IV SOLR
INTRAVENOUS | Status: AC
  Filled 2014-12-02: qty 50

## 2014-12-02 MED ORDER — SODIUM CHLORIDE 0.9 % IV SOLN
INTRAVENOUS | Status: DC | PRN
  Administered 2014-12-02: 500 mL

## 2014-12-02 MED ORDER — PHENYLEPHRINE HCL 10 MG/ML IJ SOLN
10.0000 mg | INTRAVENOUS | Status: DC | PRN
  Administered 2014-12-02: 15 ug/min via INTRAVENOUS

## 2014-12-02 MED ORDER — PROTAMINE SULFATE 10 MG/ML IV SOLN
INTRAVENOUS | Status: AC
  Filled 2014-12-02: qty 5

## 2014-12-02 MED ORDER — ACETAMINOPHEN 160 MG/5ML PO SOLN
325.0000 mg | ORAL | Status: DC | PRN
  Filled 2014-12-02: qty 20.3

## 2014-12-02 MED ORDER — NITROGLYCERIN IN D5W 200-5 MCG/ML-% IV SOLN
0.0000 ug/min | INTRAVENOUS | Status: DC
  Filled 2014-12-02: qty 250

## 2014-12-02 MED ORDER — SODIUM CHLORIDE 0.9 % IV SOLN
1000.0000 ug | INTRAVENOUS | Status: DC | PRN
  Administered 2014-12-02: .2 ug/kg/min via INTRAVENOUS

## 2014-12-02 MED ORDER — HYDRALAZINE HCL 20 MG/ML IJ SOLN
5.0000 mg | INTRAMUSCULAR | Status: DC | PRN
  Administered 2014-12-02: 5 mg via INTRAVENOUS

## 2014-12-02 MED ORDER — DEXTROSE 5 % IV SOLN
1.5000 g | Freq: Two times a day (BID) | INTRAVENOUS | Status: AC
  Administered 2014-12-02 – 2014-12-03 (×2): 1.5 g via INTRAVENOUS
  Filled 2014-12-02 (×2): qty 1.5

## 2014-12-02 MED ORDER — SENNOSIDES-DOCUSATE SODIUM 8.6-50 MG PO TABS
1.0000 | ORAL_TABLET | Freq: Every evening | ORAL | Status: DC | PRN
  Filled 2014-12-02: qty 1

## 2014-12-02 MED ORDER — OXYCODONE HCL 5 MG/5ML PO SOLN: 5.0000 mg | Freq: Once | ORAL | Status: AC | PRN

## 2014-12-02 MED ORDER — TRAMADOL HCL 50 MG PO TABS
50.0000 mg | ORAL_TABLET | Freq: Four times a day (QID) | ORAL | Status: DC | PRN
  Administered 2014-12-02 – 2014-12-03 (×2): 50 mg via ORAL
  Filled 2014-12-02 (×2): qty 1

## 2014-12-02 MED ORDER — GLYCOPYRROLATE 0.2 MG/ML IJ SOLN
INTRAMUSCULAR | Status: AC
  Filled 2014-12-02: qty 3

## 2014-12-02 MED ORDER — NEOSTIGMINE METHYLSULFATE 10 MG/10ML IV SOLN
INTRAVENOUS | Status: DC | PRN
  Administered 2014-12-02: 3 mg via INTRAVENOUS

## 2014-12-02 MED ORDER — PHENYLEPHRINE HCL 10 MG/ML IJ SOLN
INTRAMUSCULAR | Status: DC | PRN
  Administered 2014-12-02: 40 ug via INTRAVENOUS
  Administered 2014-12-02: 80 ug via INTRAVENOUS
  Administered 2014-12-02 (×3): 40 ug via INTRAVENOUS
  Administered 2014-12-02: 80 ug via INTRAVENOUS

## 2014-12-02 MED ORDER — EPHEDRINE SULFATE 50 MG/ML IJ SOLN
INTRAMUSCULAR | Status: AC
  Filled 2014-12-02: qty 1

## 2014-12-02 MED ORDER — ACETAMINOPHEN 325 MG PO TABS
ORAL_TABLET | ORAL | Status: AC
  Filled 2014-12-02: qty 2

## 2014-12-02 MED ORDER — HEPARIN SODIUM (PORCINE) 1000 UNIT/ML IJ SOLN
INTRAMUSCULAR | Status: DC | PRN
  Administered 2014-12-02: 7000 [IU] via INTRAVENOUS

## 2014-12-02 MED ORDER — GUAIFENESIN-DM 100-10 MG/5ML PO SYRP: 15.0000 mL | ORAL_SOLUTION | ORAL | Status: DC | PRN

## 2014-12-02 MED ORDER — PROTAMINE SULFATE 10 MG/ML IV SOLN
INTRAVENOUS | Status: DC | PRN
  Administered 2014-12-02 (×5): 10 mg via INTRAVENOUS

## 2014-12-02 MED ORDER — SUCCINYLCHOLINE CHLORIDE 20 MG/ML IJ SOLN
INTRAMUSCULAR | Status: AC
  Filled 2014-12-02: qty 1

## 2014-12-02 MED ORDER — PHENOL 1.4 % MT LIQD: 1.0000 | OROMUCOSAL | Status: DC | PRN

## 2014-12-02 MED ORDER — FENTANYL CITRATE (PF) 250 MCG/5ML IJ SOLN
INTRAMUSCULAR | Status: AC
  Filled 2014-12-02: qty 5

## 2014-12-02 MED ORDER — MUPIROCIN 2 % EX OINT
1.0000 "application " | TOPICAL_OINTMENT | Freq: Two times a day (BID) | CUTANEOUS | Status: DC
  Administered 2014-12-02 – 2014-12-03 (×3): 1 via NASAL
  Filled 2014-12-02 (×2): qty 22

## 2014-12-02 MED ORDER — ROCURONIUM BROMIDE 50 MG/5ML IV SOLN
INTRAVENOUS | Status: AC
  Filled 2014-12-02: qty 1

## 2014-12-02 MED ORDER — OXYCODONE HCL 5 MG PO TABS
5.0000 mg | ORAL_TABLET | Freq: Once | ORAL | Status: AC | PRN
  Administered 2014-12-02: 5 mg via ORAL

## 2014-12-02 MED ORDER — BISACODYL 5 MG PO TBEC: 5.0000 mg | DELAYED_RELEASE_TABLET | Freq: Every day | ORAL | Status: DC | PRN

## 2014-12-02 MED ORDER — LIDOCAINE HCL (CARDIAC) 20 MG/ML IV SOLN
INTRAVENOUS | Status: AC
  Filled 2014-12-02: qty 5

## 2014-12-02 MED ORDER — HYDRALAZINE HCL 20 MG/ML IJ SOLN
INTRAMUSCULAR | Status: AC
  Filled 2014-12-02: qty 1

## 2014-12-02 SURGICAL SUPPLY — 46 items
APL SKNCLS STERI-STRIP NONHPOA (GAUZE/BANDAGES/DRESSINGS) ×1
BENZOIN TINCTURE PRP APPL 2/3 (GAUZE/BANDAGES/DRESSINGS) ×2 IMPLANT
CANISTER SUCTION 2500CC (MISCELLANEOUS) ×2 IMPLANT
CANNULA VESSEL 3MM 2 BLNT TIP (CANNULA) IMPLANT
CATH ROBINSON RED A/P 18FR (CATHETERS) ×2 IMPLANT
CLIP LIGATING EXTRA MED SLVR (CLIP) ×2 IMPLANT
CLIP LIGATING EXTRA SM BLUE (MISCELLANEOUS) ×2 IMPLANT
CRADLE DONUT ADULT HEAD (MISCELLANEOUS) ×2 IMPLANT
DECANTER SPIKE VIAL GLASS SM (MISCELLANEOUS) IMPLANT
DRAIN HEMOVAC 1/8 X 5 (WOUND CARE) IMPLANT
DRSG COVADERM 4X8 (GAUZE/BANDAGES/DRESSINGS) ×1 IMPLANT
ELECT REM PT RETURN 9FT ADLT (ELECTROSURGICAL) ×2
ELECTRODE REM PT RTRN 9FT ADLT (ELECTROSURGICAL) ×1 IMPLANT
EVACUATOR SILICONE 100CC (DRAIN) IMPLANT
GAUZE SPONGE 4X4 12PLY STRL (GAUZE/BANDAGES/DRESSINGS) ×2 IMPLANT
GEL ULTRASOUND 20GR AQUASONIC (MISCELLANEOUS) IMPLANT
GLOVE BIO SURGEON STRL SZ 6.5 (GLOVE) ×1 IMPLANT
GLOVE BIOGEL PI IND STRL 6.5 (GLOVE) IMPLANT
GLOVE BIOGEL PI IND STRL 7.5 (GLOVE) IMPLANT
GLOVE BIOGEL PI INDICATOR 6.5 (GLOVE) ×2
GLOVE BIOGEL PI INDICATOR 7.5 (GLOVE) ×1
GLOVE ECLIPSE 7.0 STRL STRAW (GLOVE) ×1 IMPLANT
GLOVE SS BIOGEL STRL SZ 7.5 (GLOVE) ×1 IMPLANT
GLOVE SUPERSENSE BIOGEL SZ 7.5 (GLOVE) ×1
GOWN STRL REUS W/ TWL LRG LVL3 (GOWN DISPOSABLE) ×3 IMPLANT
GOWN STRL REUS W/TWL LRG LVL3 (GOWN DISPOSABLE) ×6
KIT BASIN OR (CUSTOM PROCEDURE TRAY) ×2 IMPLANT
KIT ROOM TURNOVER OR (KITS) ×2 IMPLANT
NEEDLE 22X1 1/2 (OR ONLY) (NEEDLE) IMPLANT
NS IRRIG 1000ML POUR BTL (IV SOLUTION) ×4 IMPLANT
PACK CAROTID (CUSTOM PROCEDURE TRAY) ×2 IMPLANT
PAD ARMBOARD 7.5X6 YLW CONV (MISCELLANEOUS) ×4 IMPLANT
PATCH HEMASHIELD 8X75 (Vascular Products) ×1 IMPLANT
SHUNT CAROTID BYPASS 10 (VASCULAR PRODUCTS) ×1 IMPLANT
SHUNT CAROTID BYPASS 12FRX15.5 (VASCULAR PRODUCTS) IMPLANT
SPONGE INTESTINAL PEANUT (DISPOSABLE) ×2 IMPLANT
STRIP CLOSURE SKIN 1/2X4 (GAUZE/BANDAGES/DRESSINGS) ×2 IMPLANT
SUT ETHILON 3 0 PS 1 (SUTURE) IMPLANT
SUT PROLENE 6 0 CC (SUTURE) ×2 IMPLANT
SUT SILK 3 0 (SUTURE)
SUT SILK 3-0 18XBRD TIE 12 (SUTURE) IMPLANT
SUT VIC AB 3-0 SH 27 (SUTURE) ×4
SUT VIC AB 3-0 SH 27X BRD (SUTURE) ×2 IMPLANT
SUT VICRYL 4-0 PS2 18IN ABS (SUTURE) ×2 IMPLANT
SYR CONTROL 10ML LL (SYRINGE) ×1 IMPLANT
WATER STERILE IRR 1000ML POUR (IV SOLUTION) ×2 IMPLANT

## 2014-12-02 NOTE — Anesthesia Preprocedure Evaluation (Addendum)
Anesthesia Evaluation  Patient identified by MRN, date of birth, ID band Patient awake    Reviewed: Allergy & Precautions, NPO status , Patient's Chart, lab work & pertinent test results  History of Anesthesia Complications Negative for: history of anesthetic complications  Airway Mallampati: III  TM Distance: >3 FB Neck ROM: Full    Dental  (+) Teeth Intact,    Pulmonary neg shortness of breath, neg sleep apnea, neg COPD, Recent URI , Residual Cough, Current Smoker,    breath sounds clear to auscultation       Cardiovascular hypertension, Pt. on medications + Peripheral Vascular Disease   Rhythm:Regular     Neuro/Psych neg Seizures PSYCHIATRIC DISORDERS Depression    GI/Hepatic negative GI ROS, Neg liver ROS,   Endo/Other  diabetes, Type 2, Oral Hypoglycemic Agents  Renal/GU Renal InsufficiencyRenal disease     Musculoskeletal   Abdominal   Peds  Hematology negative hematology ROS (+)   Anesthesia Other Findings   Reproductive/Obstetrics                           Anesthesia Physical Anesthesia Plan  ASA: III  Anesthesia Plan: General   Post-op Pain Management:    Induction: Intravenous  Airway Management Planned: Oral ETT  Additional Equipment: Arterial line  Intra-op Plan:   Post-operative Plan: Extubation in OR  Informed Consent: I have reviewed the patients History and Physical, chart, labs and discussed the procedure including the risks, benefits and alternatives for the proposed anesthesia with the patient or authorized representative who has indicated his/her understanding and acceptance.   Dental advisory given  Plan Discussed with: CRNA and Surgeon  Anesthesia Plan Comments:         Anesthesia Quick Evaluation

## 2014-12-02 NOTE — Progress Notes (Signed)
Pt taken to OR for surgery in a bed,belongings given to husband, pt reassured, v/s stable.

## 2014-12-02 NOTE — Transfer of Care (Signed)
Immediate Anesthesia Transfer of Care Note  Patient: Grace Hess  Procedure(s) Performed: Procedure(s): ENDARTERECTOMY CAROTID (Left)  Patient Location: PACU  Anesthesia Type:General  Level of Consciousness: awake, alert  and oriented  Airway & Oxygen Therapy: Patient Spontanous Breathing and Patient connected to nasal cannula oxygen  Post-op Assessment: Report given to RN, Post -op Vital signs reviewed and stable, Patient moving all extremities, Patient moving all extremities X 4 and Patient able to stick tongue midline  Post vital signs: Reviewed and stable  Last Vitals:  Filed Vitals:   12/02/14 0545  BP: 108/91  Pulse: 73  Temp: 36.6 C  Resp: 18    Complications: No apparent anesthesia complications

## 2014-12-02 NOTE — Interval H&P Note (Signed)
History and Physical Interval Note:  12/02/2014 7:04 AM  Grace Hess  has presented today for surgery, with the diagnosis of stroke  The various methods of treatment have been discussed with the patient and family. After consideration of risks, benefits and other options for treatment, the patient has consented to  Procedure(s): ENDARTERECTOMY CAROTID (Left) as a surgical intervention .  The patient's history has been reviewed, patient examined, no change in status, stable for surgery.  I have reviewed the patient's chart and labs.  Questions were answered to the patient's satisfaction.     Curt Jews

## 2014-12-02 NOTE — H&P (View-Only) (Signed)
Consult Note  Patient name: Grace Hess MRN: [id number] DOB: [birth year] Sex: female  Consulting Physician:  Hospital service  Reason for Consult:  Chief Complaint  Patient presents with  . Weakness    HISTORY OF PRESENT ILLNESS: This is a 59 year old female who presented to [facility] on 9/14 with an episode of right hand numbness and dysarthria.  This episode lasted approximately 45 minutes and then resolved.  She states that she cannot account for approximately 10 miles worth of driving.  She also notes several months ago while reaching for something her right arm dropped and she had no control over it.  This was very brief.  She underwent a MRI which was negative for an acute stroke there was disease within the cavernous left ICA and proximal left anterior cerebral artery.  At [facility] she had a CT angiogram that shows a 75%, likely 80-90 percent left carotid stenosis.  She has a small amount of residual numbness in her right arm but no weakness.  She was started on Plavix on 916.  She had previously been taking aspirin.  The patient suffers from diabetes which is poorly controlled.  Her most recent hemoglobin A1c was 8.6 she takes a statin for hyper cholesterolemia.  She is on ACE inhibitor for blood pressure.  She continues to smoke about 2 packs per day.  Past Medical History  Diagnosis Date  . Diabetes mellitus   . Hypercholesteremia   . Obesity   . Chest pain, unspecified   . Reflux esophagitis   . Cervicalgia     Past Surgical History  Procedure Laterality Date  . Cholecystectomy    . Partial hysterectomy      Social History   Social History  . Marital Status: Married    Spouse Name: N/A  . Number of Children: N/A  . Years of Education: N/A   Occupational History  . Not on file.   Social History Main Topics  . Smoking status: Current Every Day Smoker -- 2.00 packs/day    Types: Cigarettes  . Smokeless tobacco: Not on file   Comment: 1 1/2 pack a day. Smoking since age 2  . Alcohol Use: No  . Drug Use: No  . Sexual Activity: Not on file   Other Topics Concern  . Not on file   Social History Narrative    Family History  Problem Relation Age of Onset  . COPD Father   . Congestive Heart Failure Mother   . Cancer Brother     Allergies as of 11/28/2014 - Review Complete 11/28/2014  Allergen Reaction Noted  . Codeine Shortness Of Breath 03/18/2011  . Sulfa antibiotics  03/18/2011    No current facility-administered medications on file prior to encounter.   Current Outpatient Prescriptions on File Prior to Encounter  Medication Sig Dispense Refill  . ALPRAZolam (XANAX) 1 MG tablet Take 0.5 mg by mouth 2 (two) times daily.     Marland Kitchen aspirin EC 81 MG tablet Take 81 mg by mouth daily.      Marland Kitchen dicyclomine (BENTYL) 10 MG capsule Take 1 capsule (10 mg total) by mouth 4 (four) times daily -  before meals and at bedtime. Take 1 capsule (10 mg total) by mouth 3 (three) times daily - before meals (Patient taking differently: Take 10 mg by mouth daily as needed for spasms. Take 1 capsule (10 mg total) by mouth 3 (three) times daily - before meals) 90  capsule 2  . gabapentin (NEURONTIN) 300 MG capsule Take 300 mg by mouth 3 (three) times daily.    . metFORMIN (GLUCOPHAGE) 500 MG tablet Take 500 mg by mouth 2 (two) times daily with a meal.      . oxyCODONE (OXY IR/ROXICODONE) 5 MG immediate release tablet Take 5 mg by mouth every 4 (four) hours as needed for moderate pain.     . colesevelam (WELCHOL) 625 MG tablet Take 3,750 mg by mouth daily.    Marland Kitchen DEXILANT 60 MG capsule take 1 capsule by mouth every morning (Patient not taking: Reported on 11/28/2014) 30 capsule 5  . lisinopril (PRINIVIL,ZESTRIL) 20 MG tablet Take 20 mg by mouth daily.    . phenazopyridine (PYRIDIUM) 200 MG tablet Take 1 tablet (200 mg total) by mouth 3 (three) times daily as needed for pain. (Patient not taking: Reported on 11/28/2014) 6 tablet 0      REVIEW OF SYSTEMS: See history of present illness, otherwise negative  PHYSICAL EXAMINATION: General: The patient appears their stated age.  Vital signs are BP 143/48 mmHg  Pulse 66  Temp(Src) 97.1 F (36.2 C) (Oral)  Resp 18  Ht 5\' 6"  (1.676 m)  Wt 156 lb 9.6 oz (71.033 kg)  BMI 25.29 kg/m2  SpO2 98% Pulmonary: Respirations are non-labored HEENT:  No gross abnormalities Abdomen: Soft and non-tender  Musculoskeletal: There are no major deformities.   Neurologic: No focal weakness detected, mild decrease in sensation on the right arm  Skin: There are no ulcer or rashes noted. Psychiatric: The patient has normal affect. Cardiovascular: There is a regular rate and rhythm without significant murmur appreciated.  Diagnostic Studies: I have reviewed her CT angiogram which has the following findings: Non stenotic atherosclerosis of the carotid bifurcation on the right. Atherosclerosis of the distal common carotid artery with narrowing of that vessel of about 25% in that location.  Advanced atherosclerotic disease at the carotid bifurcation on the left with focal stenosis of the proximal internal carotid artery with a luminal diameter of 1 mm or less. This stenosis measures 75% when compared to the more distal cervical ICA, but the vessel is reduced in size because of diminished inflow. Therefore, the stenosis is likely 80-90% or greater. There is also a 40-50% stenosis of the left common carotid artery 3 cm proximal to the bifurcation.  30% stenosis of the left common carotid artery at its origin from the arch.  30% stenosis of the left vertebral artery origin.  I have reviewed her MRI which has the following findings: No acute stroke is evident. Mild small vessel disease.  Suspected flow reducing 75% stenosis of the distal cavernous and supraclinoid RIGHT ICA.  Diminished caliber of the cervical and petrous LEFT ICA, concern raised for proximal flow reducing  lesion. Consider carotid Dopplers for further evaluation.  Severely diseased cavernous segment LEFT ICA in conjunction with a moderate stenosis of the proximal LEFT anterior cerebral. Potential exists for LEFT hemisphere ischemia   Assessment:  Symptomatic left carotid stenosis Plan: The patient is currently on aspirin and Plavix.  Her Plavix was just started 2 days ago.  Most likely explanation for her TIA like symptoms which consisted of dysarthria and right arm numbness is her left carotid stenosis.  Therefore I have recommended proceeding with left carotid endarterectomy.  I told her that this will be performed by my partner Dr. early tomorrow.  She will need to be nothing by mouth after midnight.  We discussed the risks and benefits  of the operation which include but are not limited to bleeding, stroke, and nerve injury.  We also discussed the possibility of recurrent stenosis.  She understands and wishes to proceed.     Eldridge Abrahams, M.D. [facility] [place] Office: [phone] Pager:  [phone]

## 2014-12-02 NOTE — Progress Notes (Signed)
      Patient doing well Left neck dressing clean and dry No tongue deviation, smile symmetric Grip 5/5 right UE  S/P left CEA  COLLINS, EMMA MAUREEN PA-C

## 2014-12-02 NOTE — Anesthesia Procedure Notes (Signed)
Procedure Name: Intubation Date/Time: 12/02/2014 7:44 AM Performed by: Susa Loffler Pre-anesthesia Checklist: Patient identified, Timeout performed, Emergency Drugs available, Suction available and Patient being monitored Patient Re-evaluated:Patient Re-evaluated prior to inductionOxygen Delivery Method: Circle system utilized Preoxygenation: Pre-oxygenation with 100% oxygen Intubation Type: IV induction Ventilation: Mask ventilation without difficulty Laryngoscope Size: Mac and 3 Grade View: Grade II Tube type: Oral Tube size: 7.0 mm Airway Equipment and Method: Stylet Placement Confirmation: ETT inserted through vocal cords under direct vision,  positive ETCO2 and breath sounds checked- equal and bilateral Secured at: 21 cm Tube secured with: Tape Dental Injury: Teeth and Oropharynx as per pre-operative assessment

## 2014-12-02 NOTE — OR Nursing (Signed)
Hand grips, foot strength, and ability to wiggle toes measured upon patient entrance at 0728.

## 2014-12-02 NOTE — Progress Notes (Signed)
[facility]                                                                              Patient Demographics  Grace Hess, is a 59 y.o. female, DOB - [birth year], [id number]  Admit date - 11/28/2014   Admitting Physician Verlee Monte, MD  Outpatient Primary MD for the patient is Alonza Bogus, MD  LOS - 2   Chief Complaint  Patient presents with  . Weakness       Brief HPI  Per Dr. Sarajane Jews on 9/15 59 yow presented with hx of aphasia and right hand numbness on 9/14. Initial evaluation included CT head which was unremakable and she was referred for observation and stroke workup. Was driving home a day before admission noon when right hand went numb, lasted 45 minutes and resolved spontaneously. Had difficulty speaking, expressive aphasia same time lasting 5 minutes which resolved spontaneously. Didn't remember several miles of the trip and so pulled over and let niece drive home. On the morning of admission she had recurrent, intermittent hand and wrist numbness so came to ED. No aphasia. No leg weakness or falls. Reports several episodes of right arm sudden weakness over last several months, transient in nature. Reports "triple" vision for the last week. Patient was admitted for further workup.    Assessment & Plan    Principal Problem:   TIA (transient ischemic attack) presenting with right hand numbness, expressive aphasia - MRI of the brain showed no acute stroke - MRA showed 75% stenosis of the distal cavernous and supraclinoid right ICA, diminished caliber of the cervical and petrous left ICA. Severely diseased cavernous segment left ICA - Carotid Dopplers showed moderate 50-69% stenosis in the proximal right ICA, severe 70-99% stenosis proximal left ICA. - 2-D echo showed EF of 123456, grade 1 diastolic dysfunction. No PFO. - Patient was evaluated by neurology at the [facility], recommended dual in the platelet agents for 6 months, statin,  BP control, smoking cessation and diabetes control, vascular surgery evaluation. - Patient was transferred to [facility], seen by Dr. Trula Slade, recommended carotid endarterectomy, CT angiogram neck showed advanced atrophy chronic disease on the left 75% stenosis, when compared to more distal cervical ICA but this was reduced in size, hence likely 80-90% or greater. 40-55% of the left common carotid artery. -Continue aspirin, Plavix  - Carotid endarterectomy today  Active Problems:   HYPERCHOLESTEROLEMIA  IIA With hypertriglyceridemia - Lipid panelShowed cholesterol 303, triglycerides 967 - Patient placed on Lipitor 80 mg daily, should be on fenofibrate as well for triglycerides    Essential hypertension - Restart lisinopril, 10 mg daily    GERD Continue Protonix     DM type 2 (diabetes mellitus, type 2)Uncontrolled - Hemoglobin A1c 8.6  -Continue sliding scale insulin, Lantus and patient.  - Increase metformin to 1000 twice a day at the time of discharge      Tobacco use disorder - Patient counseled strongly on smoking cessation    Carotid artery disease - Seen by vascular surgery status post carotid endarterectomy  Code Status:Full code   Family Communication: Discussed in detail with the patient,  all imaging results, lab results explained to the patient    Disposition Plan: When cleared by vascular surgery  Time Spent in minutes   25 minutes  Procedures  MRI MRA brain   2-D echo Carotid Dopplers  Consults   Neurology Vascular surgery  DVT Prophylaxis   Lovenox   Medications  Scheduled Meds: . [MAR Hold] ALPRAZolam  0.5 mg Oral BID  . [MAR Hold] aspirin EC  325 mg Oral Daily  . [MAR Hold] atorvastatin  80 mg Oral q1800  . ceFAZolin      . [MAR Hold] Chlorhexidine Gluconate Cloth  6 each Topical Daily  . [MAR Hold] clopidogrel  75 mg Oral Daily  . [MAR Hold] enoxaparin (LOVENOX) injection  40 mg Subcutaneous Q24H  . [MAR Hold] fenofibrate  54 mg Oral  Daily  . fentaNYL      . [MAR Hold] gabapentin  300 mg Oral TID  . [MAR Hold] insulin aspart  0-9 Units Subcutaneous TID WC  . insulin aspart  4 Units Intravenous To OR  . insulin aspart  8 Units Intravenous To OR  . [MAR Hold] insulin glargine  5 Units Subcutaneous Daily  . [MAR Hold] lisinopril  10 mg Oral Daily  . [MAR Hold] mupirocin ointment  1 application Nasal BID  . [MAR Hold] nicotine  21 mg Transdermal Daily  . nitroGLYCERIN  0-200 mcg/min Intravenous To OR  . [MAR Hold] pantoprazole  80 mg Oral Daily  . remifentanil (ULTIVA) 1 mg in 50 mL normal saline (20 mcg/ml) Optime  0.0125 mcg/kg/min Intravenous To OR   Continuous Infusions:  PRN Meds:.acetaminophen **OR** acetaminophen (TYLENOL) oral liquid 160 mg/5 mL, [MAR Hold] acetaminophen **OR** [MAR Hold] acetaminophen, [MAR Hold] dicyclomine, fentaNYL (SUBLIMAZE) injection, hydrALAZINE, labetalol, metoprolol, oxyCODONE **OR** oxyCODONE, traMADol   Antibiotics   Anti-infectives    Start     Dose/Rate Route Frequency Ordered Stop   12/02/14 0720  [MAR Hold]  ceFAZolin (ANCEF) IVPB 1 g/50 mL premix     (MAR Hold since 12/02/14 [id number])  Comments:  Send with pt to OR   1 g 100 mL/hr over 30 Minutes Intravenous To ShortStay Surgical 12/01/14 1611 12/02/14 0745   12/02/14 0705  ceFAZolin (ANCEF) 2-3 GM-% IVPB SOLR    Comments:  O'Laughlin, Karen   : cabinet override      12/02/14 0705 12/02/14 1914        Subjective:   Grace Hess was seen and examined today in PACU status post surgery. Doing well, postoperatively. Patient denies dizziness, chest pain, shortness of breath, abdominal pain, N/V/D/C, new weakness, numbess, tingling. No acute events overnight.    Objective:   Blood pressure 105/69, pulse 85, temperature 98.1 F (36.7 C), temperature source Oral, resp. rate 20, height 5\' 6"  (1.676 m), weight 71.033 kg (156 lb 9.6 oz), SpO2 99 %.  Wt Readings from Last 3 Encounters:  11/28/14 71.033 kg (156 lb 9.6 oz)    11/28/14 70.761 kg (156 lb)  10/20/13 72.576 kg (160 lb)     Intake/Output Summary (Last 24 hours) at 12/02/14 1223 Last data filed at 12/02/14 0935  Gross per 24 hour  Intake   1300 ml  Output     50 ml  Net   1250 ml    Exam  General: Alert and oriented x 3, NAD  HEENT:  PERRLA, EOMI, Anicteric Sclera, mucous membranes moist.   Neck: Supple,  dressing intact  CVS: S1 S2 auscultated, no rubs, murmurs or gallops. Regular rate  and rhythm.  Respiratory: Clear to auscultation bilaterally, no wheezing, rales or rhonchi  Abdomen: Soft, nontender, nondistended, + bowel sounds  Ext: no cyanosis clubbing or edema  Neuro: no new deficit  Skin: No rashes  Psych: Normal affect and demeanor, alert and oriented x3    Data Review   Micro Results Recent Results (from the past 240 hour(s))  Surgical pcr screen     Status: Abnormal   Collection Time: 12/01/14  9:19 PM  Result Value Ref Range Status   MRSA, PCR NEGATIVE NEGATIVE Final   Staphylococcus aureus POSITIVE (A) NEGATIVE Final    Comment:        The Xpert SA Assay (FDA approved for NASAL specimens in patients over 48 years of age), is one component of a comprehensive surveillance program.  Test performance has been validated by [facility] for patients greater than or equal to 80 year old. It is not intended to diagnose infection nor to guide or monitor treatment.     Radiology Reports Ct Head Wo Contrast  11/28/2014   CLINICAL DATA:  Weakness right hand, slurred speech  EXAM: CT HEAD WITHOUT CONTRAST  TECHNIQUE: Contiguous axial images were obtained from the base of the skull through the vertex without intravenous contrast.  COMPARISON:  03/18/2011  FINDINGS: No skull fracture is noted. Paranasal sinuses and mastoid air cells are unremarkable.  No intracranial hemorrhage, mass effect or midline shift. No definite acute cortical infarction. Stable left periventricular white matter decreased attenuation adjacent  to frontal horn. Stable small lacunar infarct in left basal ganglia.  No mass lesion is noted on this unenhanced scan. There is no hydrocephalus.  IMPRESSION: No acute intracranial abnormality. No definite acute cortical infarction. Stable chronic findings as described above.   Electronically Signed   By: Lahoma Crocker M.D.   On: 11/28/2014 13:39   Ct Angio Neck W/cm &/or Wo/cm  12/01/2014   CLINICAL DATA:  Speech disturbance 5 days ago. New onset of bilateral hand tingling. Abnormal MRI and MRA a.  EXAM: CT ANGIOGRAPHY NECK  TECHNIQUE: Multidetector CT imaging of the neck was performed using the standard protocol during bolus administration of intravenous contrast. Multiplanar CT image reconstructions and MIPs were obtained to evaluate the vascular anatomy. Carotid stenosis measurements (when applicable) are obtained utilizing NASCET criteria, using the distal internal carotid diameter as the denominator.  CONTRAST:  17mL OMNIPAQUE IOHEXOL 350 MG/ML SOLN  COMPARISON:  MRI studies 11/28/2014.  Noninvasive study 11/29/2014.  FINDINGS: Aortic arch: Ordinary atherosclerosis of the arch without aneurysm or dissection. Branching pattern of the brachiocephalic vessels from the arch is normal. No flow-limiting origin stenosis. 30% stenosis of the proximal left common carotid artery.  Right carotid system: Common carotid artery is widely patent to the bifurcation region. There is atherosclerotic plaque of the distal 1 cm of the common carotid artery narrowing the lumen to a diameter of 4.5 mm compared to a more proximal luminal diameter of 5.8 mm. Atherosclerotic disease at the carotid bifurcation but without stenosis or irregularity of the proximal ICA. Beyond that, the cervical internal carotid artery is normal.  Left carotid system: Common carotid artery shows atherosclerotic disease along its course with narrowing several cm proximal to the bifurcation due to soft plaque. Minimal diameter is 3 mm compared to an expected  diameter of 5.8 mm. At the carotid bifurcation, there is extensive soft and calcified plaque. Proximal ICA is narrowed to a diameter of 1 mm or less. Compared to a more distal cervical ICA  diameter of 4 mm, this would indicate a 75% stenosis. However, the more distal cervical ICA diameter is likely reduced because of diminished inflow. This is likely an 80- 90% or greater stenosis of the proximal left ICA. Beyond that, the cervical internal carotid artery is patent.  Vertebral arteries:Both vertebral artery origins are patent. The vertebral arteries are approximately equal in size. No stenosis of the right vertebral artery origin. 30% stenosis of the left vertebral artery origin. Beyond that, the vessels are widely patent through the cervical region and both reach the basilar artery.  Skeleton: Ordinary spondylosis without significant lesion  Other neck: No mass or lymphadenopathy. Scans in the upper chest are unremarkable.  IMPRESSION: Non stenotic atherosclerosis of the carotid bifurcation on the right. Atherosclerosis of the distal common carotid artery with narrowing of that vessel of about 25% in that location.  Advanced atherosclerotic disease at the carotid bifurcation on the left with focal stenosis of the proximal internal carotid artery with a luminal diameter of 1 mm or less. This stenosis measures 75% when compared to the more distal cervical ICA, but the vessel is reduced in size because of diminished inflow. Therefore, the stenosis is likely 80-90% or greater. There is also a 40-50% stenosis of the left common carotid artery 3 cm proximal to the bifurcation.  30% stenosis of the left common carotid artery at its origin from the arch.  30% stenosis of the left vertebral artery origin.   Electronically Signed   By: Nelson Chimes M.D.   On: 12/01/2014 11:04   Mr Jodene Nam Head Wo Contrast  11/28/2014   CLINICAL DATA:  Two days of intermittent expressive aphasia, with RIGHT and/or LEFT hand numbness.  EXAM:  MRI HEAD WITHOUT CONTRAST  MRA HEAD WITHOUT CONTRAST  TECHNIQUE: Multiplanar, multiecho pulse sequences of the brain and surrounding structures were obtained without intravenous contrast. Angiographic images of the head were obtained using MRA technique without contrast.  COMPARISON:  CT head earlier today.  FINDINGS: MRI HEAD FINDINGS  No evidence for acute infarction, hemorrhage, mass lesion, hydrocephalus, or extra-axial fluid. Normal cerebral and cerebellar volume. Mild subcortical and periventricular T2 and FLAIR hyperintensities, likely chronic microvascular ischemic change.  Pituitary, pineal, and cerebellar tonsils unremarkable. No upper cervical lesions. LEFT carotid fissure perivascular space.  Flow voids are maintained throughout the RIGHT carotid, basilar, and vertebral arteries. The LEFT carotid appears of diminished caliber. There are no areas of chronic hemorrhage.  Visualized calvarium, skull base, and upper cervical osseous structures unremarkable. Scalp and extracranial soft tissues, orbits, sinuses, and mastoids show no acute process.  MRA HEAD FINDINGS  The RIGHT internal carotid artery is widely patent in its cervical, petrous, and inferior cavernous segment. There is a 75% stenosis of the distal cavernous and supraclinoid ICA, potentially flow reducing.  Small caliber of the LEFT ICA in its cervical and petrous segments. There is concern of a severe stenosis at the LEFT ICA origin or even within the LEFT common carotid artery. Consider carotid Doppler for further evaluation.  There is severely diseased inferior cavernous segment LEFT ICA with near complete loss of flow related enhancement. Superior cavernous segment is diseased but visible, and the supraclinoid LEFT ICA segment is small but probably otherwise normal, given the proximal flow reduction.  Basilar artery widely patent. Vertebrals codominant. No proximal stenosis of the middle, or posterior cerebral arteries. There is a proximal  stenosis at the origin of the LEFT anterior cerebral artery, estimated 50-75%. The LEFT anterior and middle cerebral arteries  are smaller due to diminished in flow.  Mild irregularity of the distal MCA and PCA branches suggesting intracranial atherosclerotic disease. The LEFT PICA and BILATERAL AICA vessels are poorly seen. No intracranial aneurysm.  IMPRESSION: No acute stroke is evident.  Mild small vessel disease.  Suspected flow reducing 75% stenosis of the distal cavernous and supraclinoid RIGHT ICA.  Diminished caliber of the cervical and petrous LEFT ICA, concern raised for proximal flow reducing lesion. Consider carotid Dopplers for further evaluation.  Severely diseased cavernous segment LEFT ICA in conjunction with a moderate stenosis of the proximal LEFT anterior cerebral. Potential exists for LEFT hemisphere ischemia.   Electronically Signed   By: Staci Righter M.D.   On: 11/28/2014 20:09   Mr Brain Wo Contrast  11/28/2014   CLINICAL DATA:  Two days of intermittent expressive aphasia, with RIGHT and/or LEFT hand numbness.  EXAM: MRI HEAD WITHOUT CONTRAST  MRA HEAD WITHOUT CONTRAST  TECHNIQUE: Multiplanar, multiecho pulse sequences of the brain and surrounding structures were obtained without intravenous contrast. Angiographic images of the head were obtained using MRA technique without contrast.  COMPARISON:  CT head earlier today.  FINDINGS: MRI HEAD FINDINGS  No evidence for acute infarction, hemorrhage, mass lesion, hydrocephalus, or extra-axial fluid. Normal cerebral and cerebellar volume. Mild subcortical and periventricular T2 and FLAIR hyperintensities, likely chronic microvascular ischemic change.  Pituitary, pineal, and cerebellar tonsils unremarkable. No upper cervical lesions. LEFT carotid fissure perivascular space.  Flow voids are maintained throughout the RIGHT carotid, basilar, and vertebral arteries. The LEFT carotid appears of diminished caliber. There are no areas of chronic  hemorrhage.  Visualized calvarium, skull base, and upper cervical osseous structures unremarkable. Scalp and extracranial soft tissues, orbits, sinuses, and mastoids show no acute process.  MRA HEAD FINDINGS  The RIGHT internal carotid artery is widely patent in its cervical, petrous, and inferior cavernous segment. There is a 75% stenosis of the distal cavernous and supraclinoid ICA, potentially flow reducing.  Small caliber of the LEFT ICA in its cervical and petrous segments. There is concern of a severe stenosis at the LEFT ICA origin or even within the LEFT common carotid artery. Consider carotid Doppler for further evaluation.  There is severely diseased inferior cavernous segment LEFT ICA with near complete loss of flow related enhancement. Superior cavernous segment is diseased but visible, and the supraclinoid LEFT ICA segment is small but probably otherwise normal, given the proximal flow reduction.  Basilar artery widely patent. Vertebrals codominant. No proximal stenosis of the middle, or posterior cerebral arteries. There is a proximal stenosis at the origin of the LEFT anterior cerebral artery, estimated 50-75%. The LEFT anterior and middle cerebral arteries are smaller due to diminished in flow.  Mild irregularity of the distal MCA and PCA branches suggesting intracranial atherosclerotic disease. The LEFT PICA and BILATERAL AICA vessels are poorly seen. No intracranial aneurysm.  IMPRESSION: No acute stroke is evident.  Mild small vessel disease.  Suspected flow reducing 75% stenosis of the distal cavernous and supraclinoid RIGHT ICA.  Diminished caliber of the cervical and petrous LEFT ICA, concern raised for proximal flow reducing lesion. Consider carotid Dopplers for further evaluation.  Severely diseased cavernous segment LEFT ICA in conjunction with a moderate stenosis of the proximal LEFT anterior cerebral. Potential exists for LEFT hemisphere ischemia.   Electronically Signed   By: Staci Righter M.D.   On: 11/28/2014 20:09   US Carotid Bilateral  11/29/2014   CLINICAL DATA:  59 year old female with symptoms of transient  ischemic attack  EXAM: BILATERAL CAROTID DUPLEX ULTRASOUND  TECHNIQUE: Pearline Cables scale imaging, color Doppler and duplex ultrasound were performed of bilateral carotid and vertebral arteries in the neck.  COMPARISON:  Brain MRI/ MRA 11/28/2014  FINDINGS: Criteria: Quantification of carotid stenosis is based on velocity parameters that correlate the residual internal carotid diameter with NASCET-based stenosis levels, using the diameter of the distal internal carotid lumen as the denominator for stenosis measurement.  The following velocity measurements were obtained:  RIGHT  ICA:  219/65 cm/sec  CCA:  Q000111Q cm/sec  SYSTOLIC ICA/CCA RATIO:  1.7  DIASTOLIC ICA/CCA RATIO:  1.8  ECA:  213 cm/sec  LEFT  ICA:  233/52 cm/sec  CCA:  123456 cm/sec  SYSTOLIC ICA/CCA RATIO:  2.8  DIASTOLIC ICA/CCA RATIO:  4.1  ECA:  Over 400 cm/sec  RIGHT CAROTID ARTERY: Heterogeneous atherosclerotic plaque beginning in the carotid bifurcation and extending into the proximal internal and external carotid arteries. Elevation of the peak systolic velocity in the external carotid arteries suggests some degree of stenosis although this does not exceed 60%. Elevation of the peak systolic velocity in the proximal internal carotid artery consistent with a 50- 69% diameter stenosis.  RIGHT VERTEBRAL ARTERY:  Patent with normal antegrade flow.  LEFT CAROTID ARTERY: Smooth heterogeneous atherosclerotic plaque in the mid common carotid artery without significant velocity elevation. More heterogeneous atherosclerotic plaque is noted in the carotid bifurcation extending into the proximal internal and external carotid arteries. Significant elevation of the peak systolic velocity in the proximal external carotid artery consistent with a high-grade stenosis. There is associated aliasing and spectral broadening. Similarly, there  is spectral broadening, aliasing and jetting in the proximal internal carotid artery. By peak systolic velocity criteria, the estimated stenosis is greater than 70%.  LEFT VERTEBRAL ARTERY:  Patent with normal antegrade flow.  IMPRESSION: 1. Moderate (50- 69%) stenosis proximal right internal carotid artery secondary to heterogeneous atherosclerotic plaque. 2. Severe (70- 99%) stenosis proximal left internal carotid artery secondary to heterogeneous atherosclerotic plaque. 3. Severe stenosis of the proximal left external carotid artery noted incidentally. 4. Smooth focal atherosclerotic plaque in the mid left common carotid artery without significant stenosis. 5. Vertebral arteries are patent with normal antegrade flow. Signed,  Criselda Peaches, MD  [facility]  [facility]   Electronically Signed   By: Jacqulynn Cadet M.D.   On: 11/29/2014 18:21    CBC  Recent Labs Lab 11/28/14 1240 11/28/14 1253 12/02/14 0526  WBC 12.9*  --  8.4  HGB 14.1 15.6* 13.6  HCT 41.8 46.0 41.4  PLT 256  --  211  MCV 91.5  --  90.4  MCH 30.9  --  29.7  MCHC 33.7  --  32.9  RDW 13.7  --  13.6  LYMPHSABS 3.7  --   --   MONOABS 0.7  --   --   EOSABS 0.3  --   --   BASOSABS 0.0  --   --     Chemistries   Recent Labs Lab 11/28/14 1240 11/28/14 1253 12/02/14 0526  NA 136 137 134*  K 4.6 4.7 4.5  CL 103 104 100*  CO2 22  --  25  GLUCOSE 259* 261* 223*  BUN 24* 25* 24*  CREATININE 0.90 0.80 1.08*  CALCIUM 9.1  --  9.4  AST 20  --   --   ALT 18  --   --   ALKPHOS 64  --   --  BILITOT 0.5  --   --    ------------------------------------------------------------------------------------------------------------------ estimated creatinine clearance is 52.5 mL/min (by C-G formula based on Cr of 1.08). ------------------------------------------------------------------------------------------------------------------ No results for input(s): HGBA1C in the  last 72 hours. ------------------------------------------------------------------------------------------------------------------ No results for input(s): CHOL, HDL, LDLCALC, TRIG, CHOLHDL, LDLDIRECT in the last 72 hours. ------------------------------------------------------------------------------------------------------------------ No results for input(s): TSH, T4TOTAL, T3FREE, THYROIDAB in the last 72 hours.  Invalid input(s): FREET3 ------------------------------------------------------------------------------------------------------------------ No results for input(s): VITAMINB12, FOLATE, FERRITIN, TIBC, IRON, RETICCTPCT in the last 72 hours.  Coagulation profile  Recent Labs Lab 11/28/14 1240  INR 0.99    No results for input(s): DDIMER in the last 72 hours.  Cardiac Enzymes No results for input(s): CKMB, TROPONINI, MYOGLOBIN in the last 168 hours.  Invalid input(s): CK ------------------------------------------------------------------------------------------------------------------ Invalid input(s): [place]  12/01/14 1142 12/01/14 1611 12/01/14 2216 12/02/14 0710 12/02/14 0955 12/02/14 1054  GLUCAP 197* 272* 186* 203* 147* 149*     RAI,RIPUDEEP M.D. [facility] 12/02/2014, 12:23 PM  Pager: [phone] Between 7am to 7pm - call Pager - [phone]  After 7pm go to [email or web address] - password [email or web address]  Call night coverage person covering after 7pm

## 2014-12-02 NOTE — Op Note (Signed)
     Patient name: Grace Hess MRN: [id number] DOB: 1955-09-22 Sex: female  11/28/2014 - 12/02/2014 Pre-operative Diagnosis: Symptomatic left carotid stenosis Post-operative diagnosis:  Same Surgeon:  Rosetta Posner, M.D. Assistants:  Theda Sers Procedure:    left carotid Endarterectomy with Dacron patch angioplasty Anesthesia:  General Blood Loss:  See anesthesia record   Indications for surgery:  TIA  Procedure in detail:  The patient was taken to the operating and placed in the supine position. The neck was prepped and draped in the usual sterile fashion. An incision was made anterior to the sternocleidomastoid muscle and continued with electrocautery through the platysma muscle. The muscle was retracted posteriorly and the carotid sheath was opened. The facial vein was ligated with 2-0 silk ties and divided. The common carotid artery was encircled with an umbilical tape and Rummel tourniquet. Dissection was continued onto the carotid bifurcation. The superior thyroid artery was controlled with a 2-0 silk Potts tie. The external carotid organ was encircled with a vessel loop and the internal carotid was encircled with umbilical tape and Rummel tourniquet. The hypoglossal and vagus nerves were identified and preserved.  The patient was given systemic heparinization. After adequate circulation time, the internal,external and common carotid arteries were occluded. The common carotid was opened with an 11 blade and the arteriotomy was continued with Potts scissors onto the internal carotid artery. A 10 shunt was passed up the internal carotid artery, allowed to back bleed, and then passed down the common carotid artery. The shunt was secured with Rummel tourniquet. The endarterectomy was begun on the common carotid artery  plaque was divided proximally with Potts scissors. The endarterectomy was continued onto the carotid bifurcation. The external carotid was endarterectomized by eversion technique and  the internal carotid artery was endarterectomized in an open fashion. Remaining debris was removed from the endarterectomy plane. A Dacron patch was brought to the field and sewn as a patch angioplasty. Prior to completing the anastomosis, the shunt was removed and the usual flushing maneuvers were undertaken. The anastomosis was then completed and flow was restored first to the external and then the internal carotid artery. Excellent flow characteristics were noted with hand-held Doppler in the internal and external carotid arteries.  The patient was given protamine to reverse the heparin. Hemostasis was obtained with electrocautery. The wounds were irrigated with saline. The wound was closed by first reapproximating the sternocleidomastoid muscle over the carotid artery with interrupted 3-0 Vicryl sutures. Next, the platysma was closed with a running 3-0 Vicryl suture. The skin was closed with a 4-0 subcuticular Vicryl suture. Benzoin and Steri-Strips were applied to the incision. A sterile dressing was placed over the incision. All sponge and needle counts were correct. The patient was awakened in the operating room, neurologically intact. They were transferred to the PACU in stable condition.  Carotid stenosis at surgery:80%  Disposition:  To PACU in stable condition,neurologically intact  Relevant Operative Details:  High bifurcation  Rosetta Posner, M.D. [facility] Office: [phone] Pager:  [phone]

## 2014-12-03 ENCOUNTER — Encounter (HOSPITAL_COMMUNITY): Payer: Self-pay | Admitting: Vascular Surgery

## 2014-12-03 LAB — CBC
HCT: 36.5 % (ref 36.0–46.0)
Hemoglobin: 12.3 g/dL (ref 12.0–15.0)
MCH: 30.1 pg (ref 26.0–34.0)
MCHC: 33.7 g/dL (ref 30.0–36.0)
MCV: 89.5 fL (ref 78.0–100.0)
PLATELETS: 191 10*3/uL (ref 150–400)
RBC: 4.08 MIL/uL (ref 3.87–5.11)
RDW: 13.3 % (ref 11.5–15.5)
WBC: 11.1 10*3/uL — AB (ref 4.0–10.5)

## 2014-12-03 LAB — BASIC METABOLIC PANEL
Anion gap: 8 (ref 5–15)
BUN: 20 mg/dL (ref 6–20)
CALCIUM: 8.9 mg/dL (ref 8.9–10.3)
CHLORIDE: 102 mmol/L (ref 101–111)
CO2: 24 mmol/L (ref 22–32)
CREATININE: 0.97 mg/dL (ref 0.44–1.00)
GFR calc non Af Amer: >60 mL/min (ref >60)
Glucose, Bld: 226 mg/dL — ABNORMAL HIGH (ref 65–99)
Potassium: 4.5 mmol/L (ref 3.5–5.1)
SODIUM: 134 mmol/L — AB (ref 135–145)

## 2014-12-03 LAB — GLUCOSE, CAPILLARY: Glucose-Capillary: 222 mg/dL — ABNORMAL HIGH (ref 65–99)

## 2014-12-03 MED ORDER — ATORVASTATIN CALCIUM 80 MG PO TABS: 80.0000 mg | ORAL_TABLET | Freq: Every day | ORAL | Status: DC

## 2014-12-03 MED ORDER — OXYCODONE HCL 5 MG PO TABS: 5.0000 mg | ORAL_TABLET | ORAL | Status: DC | PRN

## 2014-12-03 MED ORDER — TRAMADOL HCL 50 MG PO TABS: 50.0000 mg | ORAL_TABLET | Freq: Four times a day (QID) | ORAL | Status: DC | PRN

## 2014-12-03 MED ORDER — ASPIRIN EC 81 MG PO TBEC: 81.0000 mg | DELAYED_RELEASE_TABLET | Freq: Every day | ORAL | Status: DC

## 2014-12-03 MED ORDER — PANTOPRAZOLE SODIUM 40 MG PO TBEC: 40.0000 mg | DELAYED_RELEASE_TABLET | Freq: Every day | ORAL | Status: DC

## 2014-12-03 MED ORDER — METFORMIN HCL 1000 MG PO TABS: 1000.0000 mg | ORAL_TABLET | Freq: Two times a day (BID) | ORAL | Status: DC

## 2014-12-03 MED ORDER — FENOFIBRATE 54 MG PO TABS: 54.0000 mg | ORAL_TABLET | Freq: Every day | ORAL | Status: DC

## 2014-12-03 MED ORDER — LISINOPRIL 20 MG PO TABS: 10.0000 mg | ORAL_TABLET | Freq: Every day | ORAL | Status: DC

## 2014-12-03 MED ORDER — CLOPIDOGREL BISULFATE 75 MG PO TABS: 75.0000 mg | ORAL_TABLET | Freq: Every day | ORAL | Status: DC

## 2014-12-03 MED ORDER — NICOTINE 21 MG/24HR TD PT24: 21.0000 mg | MEDICATED_PATCH | Freq: Every day | TRANSDERMAL | Status: DC

## 2014-12-03 NOTE — Care Management Note (Signed)
Case Management Note  Patient Details  Name: Grace Hess MRN: [id number] Date of Birth: 10/19/1955  Subjective/Objective:                Admitted with TIA (transient ischemic attack) presenting with right hand numbness, expressive aphasia. S/P l carotid endareterctomy 12/02/14.   Action/Plan: Return to home when medically.  Expected Discharge Date:  12/03/14               Expected Discharge Plan:  Home/Self Care  In-House Referral:  NA  Discharge planning Services  CM Consult  Post Acute Care Choice:  NA Choice offered to:  NA  DME Arranged:    DME Agency:     HH Arranged:    HH Agency:     Status of Service:  Completed, signed off  Medicare Important Message Given:    Date Medicare IM Given:    Medicare IM give by:    Date Additional Medicare IM Given:    Additional Medicare Important Message give by:     If discussed at [place] of Stay Meetings, dates discussed:    Additional Comments:  Sharin Mons, [place] [phone] 12/03/2014, 11:04 AM

## 2014-12-03 NOTE — Progress Notes (Signed)
Subjective: Interval History: none.. Comfortable up in chair. Reports some sore throat. Did not sleep well  Objective: Vital signs in last 24 hours: Temp:  [98 F (36.7 C)-98.3 F (36.8 C)] 98.3 F (36.8 C) (09/19 2245) Pulse Rate:  [72-104] 99 (09/20 0050) Resp:  [10-32] 16 (09/20 0050) BP: (99-159)/(49-87) 157/82 mmHg (09/20 0050) SpO2:  [91 %-100 %] 91 % (09/20 0050) Arterial Line BP: (89-186)/(36-77) 174/76 mmHg (09/20 0050) Weight:  [165 lb (74.844 kg)] 165 lb (74.844 kg) (09/19 1715)  Intake/Output from previous day: 09/19 0701 - 09/20 0700 In: 1830 [P.O.:480; I.V.:1300; IV Piggyback:50] Out: 550 [Urine:500; Blood:50] Intake/Output this shift:    Dressing removed from left neck. No hematoma. 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 Explained this to the patient due to the high carotid bifurcation. Neurologically intact otherwise  Lab Results:  Recent Labs  12/02/14 0526 12/03/14 0425  WBC 8.4 11.1*  HGB 13.6 12.3  HCT 41.4 36.5  PLT 211 191   BMET  Recent Labs  12/02/14 0526  12/02/14 0925 12/03/14 0425  NA 134*  --   --  134*  K 4.5  --   --  4.5  CL 100*  --   --  102  CO2 25  --   --  24  GLUCOSE 223*  < > 172* 226*  BUN 24*  --   --  20  CREATININE 1.08*  --   --  0.97  CALCIUM 9.4  --   --  8.9  < > = values in this interval not displayed.  Studies/Results: Ct Head Wo Contrast  11/28/2014   CLINICAL DATA:  Weakness right hand, slurred speech  EXAM: CT HEAD WITHOUT CONTRAST  TECHNIQUE: Contiguous axial images were obtained from the base of the skull through the vertex without intravenous contrast.  COMPARISON:  03/18/2011  FINDINGS: No skull fracture is noted. Paranasal sinuses and mastoid air cells are unremarkable.  No intracranial hemorrhage, mass effect or midline shift. No definite acute cortical infarction. Stable left periventricular white matter decreased attenuation adjacent to frontal horn. 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  No mass lesion is noted on this unenhanced scan. There is no hydrocephalus.  IMPRESSION: No acute intracranial abnormality. No definite acute cortical infarction. Stable chronic findings as described above.   Electronically Signed   By: Lahoma Crocker M.D.   On: 11/28/2014 13:39   Ct Angio Neck W/cm &/or Wo/cm  12/01/2014   CLINICAL DATA:  Speech disturbance 5 days ago. New onset of bilateral hand tingling. Abnormal MRI and MRA a.  EXAM: CT ANGIOGRAPHY NECK  TECHNIQUE: Multidetector CT imaging of the neck was performed using the standard protocol during bolus administration of intravenous contrast. Multiplanar CT image reconstructions and MIPs were obtained to evaluate the vascular anatomy. Carotid stenosis measurements (when applicable) are obtained utilizing NASCET criteria, using the distal internal carotid diameter as the denominator.  CONTRAST:  67mL OMNIPAQUE IOHEXOL 350 MG/ML SOLN  COMPARISON:  MRI studies 11/28/2014.  Noninvasive study 11/29/2014.  FINDINGS: Aortic arch: Ordinary atherosclerosis of the arch without aneurysm or dissection. Branching pattern of the brachiocephalic vessels from the arch is normal. No flow-limiting origin stenosis. 30% stenosis of the proximal left common carotid artery.  Right carotid system: Common carotid artery is widely patent to the bifurcation region. There is atherosclerotic plaque of the distal 1 cm of the common carotid artery narrowing the lumen to a diameter of 4.5 mm  compared to a more proximal luminal diameter of 5.8 mm. Atherosclerotic disease at the carotid bifurcation but without stenosis or irregularity of the proximal ICA. Beyond that, the cervical internal carotid artery is normal.  Left carotid system: Common carotid artery shows atherosclerotic disease along its course with narrowing several cm proximal to the bifurcation due to soft plaque. Minimal diameter is 3 mm compared to an expected diameter of 5.8 mm. At the  carotid bifurcation, there is extensive soft and calcified plaque. Proximal ICA is narrowed to a diameter of 1 mm or less. Compared to a more distal cervical ICA diameter of 4 mm, this would indicate a 75% stenosis. However, the more distal cervical ICA diameter is likely reduced because of diminished inflow. This is likely an 80- 90% or greater stenosis of the proximal left ICA. Beyond that, the cervical internal carotid artery is patent.  Vertebral arteries:Both vertebral artery origins are patent. The vertebral arteries are approximately equal in size. No stenosis of the right vertebral artery origin. 30% stenosis of the left vertebral artery origin. Beyond that, the vessels are widely patent through the cervical region and both reach the basilar artery.  Skeleton: Ordinary spondylosis without significant lesion  Other neck: No mass or lymphadenopathy. Scans in the upper chest are unremarkable.  IMPRESSION: Non stenotic atherosclerosis of the carotid bifurcation on the right. Atherosclerosis of the distal common carotid artery with narrowing of that vessel of about 25% in that location.  Advanced atherosclerotic disease at the carotid bifurcation on the left with focal stenosis of the proximal internal carotid artery with a luminal diameter of 1 mm or less. This stenosis measures 75% when compared to the more distal cervical ICA, but the vessel is reduced in size because of diminished inflow. Therefore, the stenosis is likely 80-90% or greater. There is also a 40-50% stenosis of the left common carotid artery 3 cm proximal to the bifurcation.  30% stenosis of the left common carotid artery at its origin from the arch.  30% stenosis of the left vertebral artery origin.   Electronically Signed   By: Nelson Chimes M.D.   On: 12/01/2014 11:04   Mr Jodene Nam Head Wo Contrast  11/28/2014   CLINICAL DATA:  Two days of intermittent expressive aphasia, with RIGHT and/or LEFT hand numbness.  EXAM: MRI HEAD WITHOUT CONTRAST   MRA HEAD WITHOUT CONTRAST  TECHNIQUE: Multiplanar, multiecho pulse sequences of the brain and surrounding structures were obtained without intravenous contrast. Angiographic images of the head were obtained using MRA technique without contrast.  COMPARISON:  CT head earlier today.  FINDINGS: MRI HEAD FINDINGS  No evidence for acute infarction, hemorrhage, mass lesion, hydrocephalus, or extra-axial fluid. Normal cerebral and cerebellar volume. Mild subcortical and periventricular T2 and FLAIR hyperintensities, likely chronic microvascular ischemic change.  Pituitary, pineal, and cerebellar tonsils unremarkable. No upper cervical lesions. LEFT carotid fissure perivascular space.  Flow voids are maintained throughout the RIGHT carotid, basilar, and vertebral arteries. The LEFT carotid appears of diminished caliber. There are no areas of chronic hemorrhage.  Visualized calvarium, skull base, and upper cervical osseous structures unremarkable. Scalp and extracranial soft tissues, orbits, sinuses, and mastoids show no acute process.  MRA HEAD FINDINGS  The RIGHT internal carotid artery is widely patent in its cervical, petrous, and inferior cavernous segment. There is a 75% stenosis of the distal cavernous and supraclinoid ICA, potentially flow reducing.  Small caliber of the LEFT ICA in its cervical and petrous segments. There is concern of a severe  stenosis at the LEFT ICA origin or even within the LEFT common carotid artery. Consider carotid Doppler for further evaluation.  There is severely diseased inferior cavernous segment LEFT ICA with near complete loss of flow related enhancement. Superior cavernous segment is diseased but visible, and the supraclinoid LEFT ICA segment is small but probably otherwise normal, given the proximal flow reduction.  Basilar artery widely patent. Vertebrals codominant. No proximal stenosis of the middle, or posterior cerebral arteries. There is a proximal stenosis at the origin of the  LEFT anterior cerebral artery, estimated 50-75%. The LEFT anterior and middle cerebral arteries are smaller due to diminished in flow.  Mild irregularity of the distal MCA and PCA branches suggesting intracranial atherosclerotic disease. The LEFT PICA and BILATERAL AICA vessels are poorly seen. No intracranial aneurysm.  IMPRESSION: No acute stroke is evident.  Mild small vessel disease.  Suspected flow reducing 75% stenosis of the distal cavernous and supraclinoid RIGHT ICA.  Diminished caliber of the cervical and petrous LEFT ICA, concern raised for proximal flow reducing lesion. Consider carotid Dopplers for further evaluation.  Severely diseased cavernous segment LEFT ICA in conjunction with a moderate stenosis of the proximal LEFT anterior cerebral. Potential exists for LEFT hemisphere ischemia.   Electronically Signed   By: Staci Righter M.D.   On: 11/28/2014 20:09   Mr Brain Wo Contrast  11/28/2014   CLINICAL DATA:  Two days of intermittent expressive aphasia, with RIGHT and/or LEFT hand numbness.  EXAM: MRI HEAD WITHOUT CONTRAST  MRA HEAD WITHOUT CONTRAST  TECHNIQUE: Multiplanar, multiecho pulse sequences of the brain and surrounding structures were obtained without intravenous contrast. Angiographic images of the head were obtained using MRA technique without contrast.  COMPARISON:  CT head earlier today.  FINDINGS: MRI HEAD FINDINGS  No evidence for acute infarction, hemorrhage, mass lesion, hydrocephalus, or extra-axial fluid. Normal cerebral and cerebellar volume. Mild subcortical and periventricular T2 and FLAIR hyperintensities, likely chronic microvascular ischemic change.  Pituitary, pineal, and cerebellar tonsils unremarkable. No upper cervical lesions. LEFT carotid fissure perivascular space.  Flow voids are maintained throughout the RIGHT carotid, basilar, and vertebral arteries. The LEFT carotid appears of diminished caliber. There are no areas of chronic hemorrhage.  Visualized calvarium,  skull base, and upper cervical osseous structures unremarkable. Scalp and extracranial soft tissues, orbits, sinuses, and mastoids show no acute process.  MRA HEAD FINDINGS  The RIGHT internal carotid artery is widely patent in its cervical, petrous, and inferior cavernous segment. There is a 75% stenosis of the distal cavernous and supraclinoid ICA, potentially flow reducing.  Small caliber of the LEFT ICA in its cervical and petrous segments. There is concern of a severe stenosis at the LEFT ICA origin or even within the LEFT common carotid artery. Consider carotid Doppler for further evaluation.  There is severely diseased inferior cavernous segment LEFT ICA with near complete loss of flow related enhancement. Superior cavernous segment is diseased but visible, and the supraclinoid LEFT ICA segment is small but probably otherwise normal, given the proximal flow reduction.  Basilar artery widely patent. Vertebrals codominant. No proximal stenosis of the middle, or posterior cerebral arteries. There is a proximal stenosis at the origin of the LEFT anterior cerebral artery, estimated 50-75%. The LEFT anterior and middle cerebral arteries are smaller due to diminished in flow.  Mild irregularity of the distal MCA and PCA branches suggesting intracranial atherosclerotic disease. The LEFT PICA and BILATERAL AICA vessels are poorly seen. No intracranial aneurysm.  IMPRESSION: No acute stroke is  evident.  Mild small vessel disease.  Suspected flow reducing 75% stenosis of the distal cavernous and supraclinoid RIGHT ICA.  Diminished caliber of the cervical and petrous LEFT ICA, concern raised for proximal flow reducing lesion. Consider carotid Dopplers for further evaluation.  Severely diseased cavernous segment LEFT ICA in conjunction with a moderate stenosis of the proximal LEFT anterior cerebral. Potential exists for LEFT hemisphere ischemia.   Electronically Signed   By: Staci Righter M.D.   On: 11/28/2014 20:09    US Carotid Bilateral  11/29/2014   CLINICAL DATA:  59 year old female with symptoms of transient ischemic attack  EXAM: BILATERAL CAROTID DUPLEX ULTRASOUND  TECHNIQUE: Pearline Cables scale imaging, color Doppler and duplex ultrasound were performed of bilateral carotid and vertebral arteries in the neck.  COMPARISON:  Brain MRI/ MRA 11/28/2014  FINDINGS: Criteria: Quantification of carotid stenosis is based on velocity parameters that correlate the residual internal carotid diameter with NASCET-based stenosis levels, using the diameter of the distal internal carotid lumen as the denominator for stenosis measurement.  The following velocity measurements were obtained:  RIGHT  ICA:  219/65 cm/sec  CCA:  [id number] cm/sec  SYSTOLIC ICA/CCA RATIO:  1.7  DIASTOLIC ICA/CCA RATIO:  1.8  ECA:  213 cm/sec  LEFT  ICA:  233/52 cm/sec  CCA:  123456 cm/sec  SYSTOLIC ICA/CCA RATIO:  2.8  DIASTOLIC ICA/CCA RATIO:  4.1  ECA:  Over 400 cm/sec  RIGHT CAROTID ARTERY: Heterogeneous atherosclerotic plaque beginning in the carotid bifurcation and extending into the proximal internal and external carotid arteries. Elevation of the peak systolic velocity in the external carotid arteries suggests some degree of stenosis although this does not exceed 60%. Elevation of the peak systolic velocity in the proximal internal carotid artery consistent with a 50- 69% diameter stenosis.  RIGHT VERTEBRAL ARTERY:  Patent with normal antegrade flow.  LEFT CAROTID ARTERY: Smooth heterogeneous atherosclerotic plaque in the mid common carotid artery without significant velocity elevation. More heterogeneous atherosclerotic plaque is noted in the carotid bifurcation extending into the proximal internal and external carotid arteries. Significant elevation of the peak systolic velocity in the proximal external carotid artery consistent with a high-grade stenosis. There is associated aliasing and spectral broadening. Similarly, there is spectral broadening, aliasing and  jetting in the proximal internal carotid artery. By peak systolic velocity criteria, the estimated stenosis is greater than 70%.  LEFT VERTEBRAL ARTERY:  Patent with normal antegrade flow.  IMPRESSION: 1. Moderate (50- 69%) stenosis proximal right internal carotid artery secondary to heterogeneous atherosclerotic plaque. 2. Severe (70- 99%) stenosis proximal left internal carotid artery secondary to heterogeneous atherosclerotic plaque. 3. Severe stenosis of the proximal left external carotid artery noted incidentally. 4. Smooth focal atherosclerotic plaque in the mid left common carotid artery without significant stenosis. 5. Vertebral arteries are patent with normal antegrade flow. Signed,  Criselda Peaches, MD  [facility]  [facility]   Electronically Signed   By: Jacqulynn Cadet M.D.   On: 11/29/2014 18:21   Anti-infectives: Anti-infectives    Start     Dose/Rate Route Frequency Ordered Stop   12/02/14 1730  cefUROXime (ZINACEF) 1.5 g in dextrose 5 % 50 mL IVPB     1.5 g 100 mL/hr over 30 Minutes Intravenous Every 12 hours 12/02/14 1728 12/03/14 0643   12/02/14 0720  [MAR Hold]  ceFAZolin (ANCEF) IVPB 1 g/50 mL premix     (MAR Hold since 12/02/14 [id number])  Comments:  Send with pt to OR  1 g 100 mL/hr over 30 Minutes Intravenous To ShortStay [facility] 12/01/14 1611 12/02/14 0745   12/02/14 0705  ceFAZolin (ANCEF) 2-3 GM-% IVPB SOLR    Comments:  O'Laughlin, Karen   : cabinet override      12/02/14 0705 12/02/14 1914      Assessment/Plan: s/p Procedure(s): ENDARTERECTOMY CAROTID (Left) Stable postop day 1. Should be able to be discharged today when she is up and comfortable walking. Will see in the office in 2-3 weeks for follow-up   LOS: 3 days   Early, Todd 12/03/2014, 7:11 AM

## 2014-12-03 NOTE — Anesthesia Postprocedure Evaluation (Signed)
  Anesthesia Post-op Note  Patient: Grace Hess  Procedure(s) Performed: Procedure(s): ENDARTERECTOMY CAROTID (Left)  Patient Location: PACU  Anesthesia Type:General  Level of Consciousness: awake  Airway and Oxygen Therapy: Patient Spontanous Breathing  Post-op Pain: none  Post-op Assessment: Post-op Vital signs reviewed, Patient's Cardiovascular Status Stable, Respiratory Function Stable, Patent Airway, No signs of Nausea or vomiting and Pain level controlled LLE Motor Response: Purposeful movement LLE Sensation: Decreased (toes numbness) RLE Motor Response: Purposeful movement RLE Sensation: Decreased (numbness in toes)      Post-op Vital Signs: Reviewed and stable  Last Vitals:  Filed Vitals:   12/03/14 0753  BP:   Pulse:   Temp: 36.7 C  Resp:     Complications: No apparent anesthesia complications

## 2014-12-03 NOTE — Progress Notes (Signed)
Patient prescriptions and discharge instruction given and explained to patient and patient's husband. All questions answered. PIVs discontinued, no signs of bleeding or infection. Belongings sent with patient. Grace Hess, NT discharged patient via wheelchair.

## 2014-12-03 NOTE — Discharge Summary (Signed)
Physician Discharge Summary   Patient ID: Grace Hess MRN: [id number] DOB/AGE: 1955-03-24 59 y.o.  Admit date: 11/28/2014 Discharge date: 12/03/2014  Primary Care Physician:  Alonza Bogus, MD  Discharge Diagnoses:    . TIA (transient ischemic attack)    Carotid artery disease status post CEA     Uncontrolled type 2 diabetes mellitus    Hyperlipidemia with hypertriglyceridemia  . Tobacco use disorder . Essential hypertension . GERD . Carotid artery disease  Consults:  Neurology Vascular surgery, Dr. Donnetta Hutching   Recommendations for Outpatient Follow-up:  Per neurology, Dr. Merlene Laughter, started on aspirin and Plavix for 6 months then Plavix daily  Patient underwent carotid endarterectomy, patient to follow-up with Dr. Donnetta Hutching in 2 weeks  Patient started on statin, fenofibrate added for hypertriglyceridemia, please check Lipid panel, LFTs in 4 weeks  TESTS THAT NEED FOLLOW-UP  please check Lipid panel, LFTs in 4 weeks  Hemoglobin A1c in 3 months    DIET: Carb modified diet    Allergies:   Allergies  Allergen Reactions  . Codeine Shortness Of Breath  . Sulfa Antibiotics     Childhood allergy     Discharge Medications:   Medication List    STOP taking these medications        colesevelam 625 MG tablet  Commonly known as:  WELCHOL     DEXILANT 60 MG capsule  Generic drug:  dexlansoprazole      TAKE these medications        ALPRAZolam 1 MG tablet  Commonly known as:  XANAX  Take 0.5 mg by mouth 2 (two) times daily.     aspirin EC 81 MG tablet  Take 1 tablet (81 mg total) by mouth daily. Please continue Aspirin and plavix for 6 months, then plavix daily     atorvastatin 80 MG tablet  Commonly known as:  LIPITOR  Take 1 tablet (80 mg total) by mouth at bedtime.     clopidogrel 75 MG tablet  Commonly known as:  PLAVIX  Take 1 tablet (75 mg total) by mouth daily.     dicyclomine 10 MG capsule  Commonly known as:  BENTYL  Take 1 capsule (10 mg  total) by mouth 4 (four) times daily -  before meals and at bedtime. Take 1 capsule (10 mg total) by mouth 3 (three) times daily - before meals     fenofibrate 54 MG tablet  Take 1 tablet (54 mg total) by mouth daily.     gabapentin 300 MG capsule  Commonly known as:  NEURONTIN  Take 300 mg by mouth 3 (three) times daily.     lisinopril 20 MG tablet  Commonly known as:  PRINIVIL,ZESTRIL  Take 0.5 tablets (10 mg total) by mouth daily.     metFORMIN 1000 MG tablet  Commonly known as:  GLUCOPHAGE  Take 1 tablet (1,000 mg total) by mouth 2 (two) times daily with a meal.     nicotine 21 mg/24hr patch  Commonly known as:  NICODERM CQ - dosed in mg/24 hours  Place 1 patch (21 mg total) onto the skin daily.     oxyCODONE 5 MG immediate release tablet  Commonly known as:  Oxy IR/ROXICODONE  Take 1 tablet (5 mg total) by mouth every 4 (four) hours as needed for moderate pain.     pantoprazole 40 MG tablet  Commonly known as:  PROTONIX  Take 1 tablet (40 mg total) by mouth daily.     phenazopyridine 200 MG tablet  Commonly known as:  PYRIDIUM  Take 1 tablet (200 mg total) by mouth 3 (three) times daily as needed for pain.     traMADol 50 MG tablet  Commonly known as:  ULTRAM  Take 1 tablet (50 mg total) by mouth every 6 (six) hours as needed for moderate pain.         Brief H and P: For complete details please refer to admission H and P, but in brief59 yow presented with hx of aphasia and right hand numbness on 9/14. Initial evaluation included CT head which was unremakable and she was referred for observation and stroke workup. Was driving home a day before admission noon when right hand went numb, lasted 45 minutes and resolved spontaneously. Had difficulty speaking, expressive aphasia same time lasting 5 minutes which resolved spontaneously. Didn't remember several miles of the trip and so pulled over and let niece drive home. On the morning of admission she had recurrent,  intermittent hand and wrist numbness so came to ED. No aphasia. No leg weakness or falls. Reports several episodes of right arm sudden weakness over last several months, transient in nature. Reports "triple" vision for the last week. Patient was admitted for further workup.    Hospital Course:  TIA (transient ischemic attack) presenting with right hand numbness, expressive aphasia - MRI of the brain showed no acute stroke - MRA showed 75% stenosis of the distal cavernous and supraclinoid right ICA, diminished caliber of the cervical and petrous left ICA. Severely diseased cavernous segment left ICA - Carotid Dopplers showed moderate 50-69% stenosis in the proximal right ICA, severe 70-99% stenosis proximal left ICA. - 2-D echo showed EF of 123456, grade 1 diastolic dysfunction. No PFO. - Patient was evaluated by neurology at the [facility], recommended dual in the platelet agents for 6 months, statin, BP control, smoking cessation and diabetes control, vascular surgery evaluation. - Patient was transferred to [facility], seen by Dr. Trula Slade, recommended carotid endarterectomy, CT angiogram neck showed advanced atrophy chronic disease on the left 75% stenosis, when compared to more distal cervical ICA but this was reduced in size, hence likely 80-90% or greater. 40-55% of the left common carotid artery. -Continue aspirin, Plavix for 6 months and then Plavix daily - Patient underwent carotid endarterectomy on 9/19, currently stable, cleared for discharge by vascular surgery    HYPERCHOLESTEROLEMIA IIA With hypertriglyceridemia - Lipid panelShowed cholesterol 303, triglycerides 967 - Patient placed on Lipitor 80 mg daily, placed on fenofibrate as well for triglycerides   Essential hypertension - Restart lisinopril, 10 mg daily   GERD Continue Protonix   DM type 2 (diabetes mellitus, type 2)Uncontrolled - Hemoglobin A1c 8.6  -Patient was placed on sliding scale  insulin, Lantus while she was inpatient. - Increased metformin to 1000 twice a day at the time of discharge    Tobacco use disorder - Patient counseled strongly on smoking cessation   Carotid artery disease - Seen by vascular surgery status post carotid endarterectomy, cleared for discharge by vascular surgery. Follow-up with Dr. Donnetta Hutching in 2-3 weeks postop.   Day of Discharge BP 109/51 mmHg  Pulse 93  Temp(Src) 98.1 F (36.7 C) (Oral)  Resp 15  Ht 5\' 5"  (1.651 m)  Wt 74.844 kg (165 lb)  BMI 27.46 kg/m2  SpO2 94%  Physical Exam: General: Alert and awake oriented x3 not in any acute distress. HEENT: anicteric sclera, pupils reactive to light and accommodation, dressing intact CVS: S1-S2 clear no murmur rubs or  gallops Chest: clear to auscultation bilaterally, no wheezing rales or rhonchi Abdomen: soft nontender, nondistended, normal bowel sounds Extremities: no cyanosis, clubbing or edema noted bilaterally Neuro: Cranial nerves II-XII intact, no focal neurological deficits   The results of significant diagnostics from this hospitalization (including imaging, microbiology, ancillary and laboratory) are listed below for reference.    LAB RESULTS: Basic Metabolic Panel:  Recent Labs Lab 12/02/14 0526  12/02/14 0925 12/03/14 0425  NA 134*  --   --  134*  K 4.5  --   --  4.5  CL 100*  --   --  102  CO2 25  --   --  24  GLUCOSE 223*  < > 172* 226*  BUN 24*  --   --  20  CREATININE 1.08*  --   --  0.97  CALCIUM 9.4  --   --  8.9  < > = values in this interval not displayed. Liver Function Tests:  Recent Labs Lab 11/28/14 1240  AST 20  ALT 18  ALKPHOS 64  BILITOT 0.5  PROT 7.7  ALBUMIN 4.5   No results for input(s): LIPASE, AMYLASE in the last 168 hours. No results for input(s): AMMONIA in the last 168 hours. CBC:  Recent Labs Lab 11/28/14 1240  12/02/14 0526 12/03/14 0425  WBC 12.9*  --  8.4 11.1*  NEUTROABS 8.2*  --   --   --   HGB 14.1  < > 13.6  12.3  HCT 41.8  < > 41.4 36.5  MCV 91.5  --  90.4 89.5  PLT 256  --  211 191  < > = values in this interval not displayed. Cardiac Enzymes: No results for input(s): CKTOTAL, CKMB, CKMBINDEX, TROPONINI in the last 168 hours. BNP: Invalid input(s): POCBNP CBG:  Recent Labs Lab 12/02/14 2113 12/03/14 0757  GLUCAP 245* 222*    Significant Diagnostic Studies:  Ct Head Wo Contrast  11/28/2014   CLINICAL DATA:  Weakness right hand, slurred speech  EXAM: CT HEAD WITHOUT CONTRAST  TECHNIQUE: Contiguous axial images were obtained from the base of the skull through the vertex without intravenous contrast.  COMPARISON:  03/18/2011  FINDINGS: No skull fracture is noted. Paranasal sinuses and mastoid air cells are unremarkable.  No intracranial hemorrhage, mass effect or midline shift. No definite acute cortical infarction. Stable left periventricular white matter decreased attenuation adjacent to frontal horn. Stable small lacunar infarct in left basal ganglia.  No mass lesion is noted on this unenhanced scan. There is no hydrocephalus.  IMPRESSION: No acute intracranial abnormality. No definite acute cortical infarction. Stable chronic findings as described above.   Electronically Signed   By: Lahoma Crocker M.D.   On: 11/28/2014 13:39   Mr Jodene Nam Head Wo Contrast  11/28/2014   CLINICAL DATA:  Two days of intermittent expressive aphasia, with RIGHT and/or LEFT hand numbness.  EXAM: MRI HEAD WITHOUT CONTRAST  MRA HEAD WITHOUT CONTRAST  TECHNIQUE: Multiplanar, multiecho pulse sequences of the brain and surrounding structures were obtained without intravenous contrast. Angiographic images of the head were obtained using MRA technique without contrast.  COMPARISON:  CT head earlier today.  FINDINGS: MRI HEAD FINDINGS  No evidence for acute infarction, hemorrhage, mass lesion, hydrocephalus, or extra-axial fluid. Normal cerebral and cerebellar volume. Mild subcortical and periventricular T2 and FLAIR  hyperintensities, likely chronic microvascular ischemic change.  Pituitary, pineal, and cerebellar tonsils unremarkable. No upper cervical lesions. LEFT carotid fissure perivascular space.  Flow voids are maintained throughout the  RIGHT carotid, basilar, and vertebral arteries. The LEFT carotid appears of diminished caliber. There are no areas of chronic hemorrhage.  Visualized calvarium, skull base, and upper cervical osseous structures unremarkable. Scalp and extracranial soft tissues, orbits, sinuses, and mastoids show no acute process.  MRA HEAD FINDINGS  The RIGHT internal carotid artery is widely patent in its cervical, petrous, and inferior cavernous segment. There is a 75% stenosis of the distal cavernous and supraclinoid ICA, potentially flow reducing.  Small caliber of the LEFT ICA in its cervical and petrous segments. There is concern of a severe stenosis at the LEFT ICA origin or even within the LEFT common carotid artery. Consider carotid Doppler for further evaluation.  There is severely diseased inferior cavernous segment LEFT ICA with near complete loss of flow related enhancement. Superior cavernous segment is diseased but visible, and the supraclinoid LEFT ICA segment is small but probably otherwise normal, given the proximal flow reduction.  Basilar artery widely patent. Vertebrals codominant. No proximal stenosis of the middle, or posterior cerebral arteries. There is a proximal stenosis at the origin of the LEFT anterior cerebral artery, estimated 50-75%. The LEFT anterior and middle cerebral arteries are smaller due to diminished in flow.  Mild irregularity of the distal MCA and PCA branches suggesting intracranial atherosclerotic disease. The LEFT PICA and BILATERAL AICA vessels are poorly seen. No intracranial aneurysm.  IMPRESSION: No acute stroke is evident.  Mild small vessel disease.  Suspected flow reducing 75% stenosis of the distal cavernous and supraclinoid RIGHT ICA.  Diminished  caliber of the cervical and petrous LEFT ICA, concern raised for proximal flow reducing lesion. Consider carotid Dopplers for further evaluation.  Severely diseased cavernous segment LEFT ICA in conjunction with a moderate stenosis of the proximal LEFT anterior cerebral. Potential exists for LEFT hemisphere ischemia.   Electronically Signed   By: Staci Righter M.D.   On: 11/28/2014 20:09   Mr Brain Wo Contrast  11/28/2014   CLINICAL DATA:  Two days of intermittent expressive aphasia, with RIGHT and/or LEFT hand numbness.  EXAM: MRI HEAD WITHOUT CONTRAST  MRA HEAD WITHOUT CONTRAST  TECHNIQUE: Multiplanar, multiecho pulse sequences of the brain and surrounding structures were obtained without intravenous contrast. Angiographic images of the head were obtained using MRA technique without contrast.  COMPARISON:  CT head earlier today.  FINDINGS: MRI HEAD FINDINGS  No evidence for acute infarction, hemorrhage, mass lesion, hydrocephalus, or extra-axial fluid. Normal cerebral and cerebellar volume. Mild subcortical and periventricular T2 and FLAIR hyperintensities, likely chronic microvascular ischemic change.  Pituitary, pineal, and cerebellar tonsils unremarkable. No upper cervical lesions. LEFT carotid fissure perivascular space.  Flow voids are maintained throughout the RIGHT carotid, basilar, and vertebral arteries. The LEFT carotid appears of diminished caliber. There are no areas of chronic hemorrhage.  Visualized calvarium, skull base, and upper cervical osseous structures unremarkable. Scalp and extracranial soft tissues, orbits, sinuses, and mastoids show no acute process.  MRA HEAD FINDINGS  The RIGHT internal carotid artery is widely patent in its cervical, petrous, and inferior cavernous segment. There is a 75% stenosis of the distal cavernous and supraclinoid ICA, potentially flow reducing.  Small caliber of the LEFT ICA in its cervical and petrous segments. There is concern of a severe stenosis at the  LEFT ICA origin or even within the LEFT common carotid artery. Consider carotid Doppler for further evaluation.  There is severely diseased inferior cavernous segment LEFT ICA with near complete loss of flow related enhancement. Superior cavernous segment is diseased  but visible, and the supraclinoid LEFT ICA segment is small but probably otherwise normal, given the proximal flow reduction.  Basilar artery widely patent. Vertebrals codominant. No proximal stenosis of the middle, or posterior cerebral arteries. There is a proximal stenosis at the origin of the LEFT anterior cerebral artery, estimated 50-75%. The LEFT anterior and middle cerebral arteries are smaller due to diminished in flow.  Mild irregularity of the distal MCA and PCA branches suggesting intracranial atherosclerotic disease. The LEFT PICA and BILATERAL AICA vessels are poorly seen. No intracranial aneurysm.  IMPRESSION: No acute stroke is evident.  Mild small vessel disease.  Suspected flow reducing 75% stenosis of the distal cavernous and supraclinoid RIGHT ICA.  Diminished caliber of the cervical and petrous LEFT ICA, concern raised for proximal flow reducing lesion. Consider carotid Dopplers for further evaluation.  Severely diseased cavernous segment LEFT ICA in conjunction with a moderate stenosis of the proximal LEFT anterior cerebral. Potential exists for LEFT hemisphere ischemia.   Electronically Signed   By: Staci Righter M.D.   On: 11/28/2014 20:09   US Carotid Bilateral  11/29/2014   CLINICAL DATA:  58 year old female with symptoms of transient ischemic attack  EXAM: BILATERAL CAROTID DUPLEX ULTRASOUND  TECHNIQUE: Pearline Cables scale imaging, color Doppler and duplex ultrasound were performed of bilateral carotid and vertebral arteries in the neck.  COMPARISON:  Brain MRI/ MRA 11/28/2014  FINDINGS: Criteria: Quantification of carotid stenosis is based on velocity parameters that correlate the residual internal carotid diameter with  NASCET-based stenosis levels, using the diameter of the distal internal carotid lumen as the denominator for stenosis measurement.  The following velocity measurements were obtained:  RIGHT  ICA:  219/65 cm/sec  CCA:  Q000111Q cm/sec  SYSTOLIC ICA/CCA RATIO:  1.7  DIASTOLIC ICA/CCA RATIO:  1.8  ECA:  213 cm/sec  LEFT  ICA:  233/52 cm/sec  CCA:  123456 cm/sec  SYSTOLIC ICA/CCA RATIO:  2.8  DIASTOLIC ICA/CCA RATIO:  4.1  ECA:  Over 400 cm/sec  RIGHT CAROTID ARTERY: Heterogeneous atherosclerotic plaque beginning in the carotid bifurcation and extending into the proximal internal and external carotid arteries. Elevation of the peak systolic velocity in the external carotid arteries suggests some degree of stenosis although this does not exceed 60%. Elevation of the peak systolic velocity in the proximal internal carotid artery consistent with a 50- 69% diameter stenosis.  RIGHT VERTEBRAL ARTERY:  Patent with normal antegrade flow.  LEFT CAROTID ARTERY: Smooth heterogeneous atherosclerotic plaque in the mid common carotid artery without significant velocity elevation. More heterogeneous atherosclerotic plaque is noted in the carotid bifurcation extending into the proximal internal and external carotid arteries. Significant elevation of the peak systolic velocity in the proximal external carotid artery consistent with a high-grade stenosis. There is associated aliasing and spectral broadening. Similarly, there is spectral broadening, aliasing and jetting in the proximal internal carotid artery. By peak systolic velocity criteria, the estimated stenosis is greater than 70%.  LEFT VERTEBRAL ARTERY:  Patent with normal antegrade flow.  IMPRESSION: 1. Moderate (50- 69%) stenosis proximal right internal carotid artery secondary to heterogeneous atherosclerotic plaque. 2. Severe (70- 99%) stenosis proximal left internal carotid artery secondary to heterogeneous atherosclerotic plaque. 3. Severe stenosis of the proximal left  external carotid artery noted incidentally. 4. Smooth focal atherosclerotic plaque in the mid left common carotid artery without significant stenosis. 5. Vertebral arteries are patent with normal antegrade flow. Signed,  Criselda Peaches, MD  [facility]  [facility]   Electronically  Signed   By: Jacqulynn Cadet M.D.   On: 11/29/2014 18:21    2D ECHO: Study Conclusions  - Left ventricle: The cavity size was normal. There was mild focal basal hypertrophy of the septum. Systolic function was normal. The estimated ejection fraction was in the range of 60% to 65%. Wall motion was normal; there were no regional wall motion abnormalities. Doppler parameters are consistent with abnormal left ventricular relaxation (grade 1 diastolic dysfunction). - Aortic valve: Trileaflet; mildly calcified leaflets. There was trivial regurgitation. - Mitral valve: Calcified annulus. - Right atrium: Central venous pressure (est): 3 mm Hg. - Atrial septum: There was increased thickness of the septum, consistent with lipomatous hypertrophy. No defect or patent foramen ovale was identified. - Tricuspid valve: There was trivial regurgitation. - Pulmonary arteries: Systolic pressure could not be accurately estimated. - Pericardium, extracardiac: A prominent pericardial fat pad was present.   Disposition and Follow-up: Discharge Instructions    Call MD for:  redness, tenderness, or signs of infection (pain, swelling, bleeding, redness, odor or green/yellow discharge around incision site)    Complete by:  As directed      Call MD for:  severe or increased pain, loss or decreased feeling  in affected limb(s)    Complete by:  As directed      Call MD for:  temperature >100.5    Complete by:  As directed      Diet Carb Modified    Complete by:  As directed      Discharge instructions    Complete by:  As directed   You may shower in 24 hours      Discharge instructions    Complete by:  As directed   It is VERY IMPORTANT that you follow up with a PCP on a regular basis.  Check your blood glucoses before each meal and at bedtime and maintain a log of your readings.  Bring this log with you when you follow up with your PCP so that he or she can adjust your medications at your follow up visit.     Driving Restrictions    Complete by:  As directed   No driving for 2 weeks     Increase activity slowly    Complete by:  As directed   Walk with assistance use walker or cane as needed     Increase activity slowly    Complete by:  As directed      Lifting restrictions    Complete by:  As directed   No heavy lifting for 6 weeks     Resume previous diet    Complete by:  As directed      Statin already ordered    Complete by:  As directed             DISPOSITION: home    DISCHARGE FOLLOW-UP Follow-up Information    Follow up with HAWKINS,EDWARD L, MD. Schedule an appointment as soon as possible for a visit in 2 weeks.   Specialty:  Pulmonary Disease   Why:  for hospital follow-up, obtain labs, CMET for liver function tests, lipid panel    Contact information:   [address] [place] [address] [phone]       Follow up with [facility], KOFI, MD. Schedule an appointment as soon as possible for a visit in 1 month.   Specialty:  Neurology   Why:  for hospital follow-up for stroke   Contact information:   [id number] [place] [address] [phone]       Follow up with Curt Jews, MD. Schedule an appointment as soon as possible for a visit in 2 weeks.   Specialties:  Vascular Surgery, Cardiology   Why:  for hospital follow-up   Contact information:   [place] [address] [phone]        Time spent on Discharge: 35 mins   Signed:   RAI,RIPUDEEP M.D. [facility] 12/03/2014, 11:22 AM Pager: [phone]

## 2014-12-09 ENCOUNTER — Telehealth: Payer: Self-pay

## 2014-12-09 NOTE — Telephone Encounter (Signed)
Phone call placed to pt. to follow-up on call placed through answering service on Friday, 12/06/14, evening.  Questioned pt. about her symptoms.  Reported she had 3-4 episodes of "severe headache" on Friday, 9/23.  Reported her husband spoke with the MD on call and was advised to take pt. to the ER for poss. cerebral bleed. The pt. Stated she decided not to go to the  ER.  Reported she vomited several times on Saturday, but did not have a headache that day.  Reported she had a severe headache last night @ about 3:00 AM, that was relieved with ES Tylenol.  Stated she has a "dull H/A" @ this time.  Also reported she had been constipated and noticed the 1st headache on Friday, occurred with straining to have a BM.  Stated she had taken a Fleets Enema and now has started having small BM's.  Denied any abdominal distension or nausea today.  Will discuss above symptoms with MD in office today.

## 2014-12-09 NOTE — Telephone Encounter (Signed)
Rec'd phone call from pt.  Reported she went to her PCP today, and her BP was 134/66.  Stated that the PCP doesn't think her new medications would be contributing to her headaches.  Questioned how her H/A is at this time?  Reported "I took Tylenol before it started up again."   Pt. knows to call office if symptoms worsen.

## 2014-12-09 NOTE — Telephone Encounter (Signed)
Discussed pt's symptoms with Dr. Trula Slade.  Recommended that pt. contact PCP for evaluation of blood pressure.  Notified pt. of Dr. Stephens Shire recommendations.  Verb. Understanding.  Stated she called and left message with PCP this AM, just after 9:00 AM, and will try to contact their office again.  Also, reported she hasn't taken her Lisinopril for a couple of days, stating "I just don't know about these new medications."

## 2014-12-10 ENCOUNTER — Encounter (HOSPITAL_COMMUNITY): Payer: Self-pay | Admitting: Emergency Medicine

## 2014-12-10 ENCOUNTER — Telehealth: Payer: Self-pay

## 2014-12-10 ENCOUNTER — Emergency Department (HOSPITAL_COMMUNITY): Payer: 59

## 2014-12-10 ENCOUNTER — Observation Stay (HOSPITAL_COMMUNITY)
Admission: EM | Admit: 2014-12-10 | Discharge: 2014-12-12 | Disposition: A | Discharge status: Home / Self Care | Payer: 59 | Attending: Vascular Surgery | Admitting: Vascular Surgery

## 2014-12-10 DIAGNOSIS — E780 Pure hypercholesterolemia: Secondary | ICD-10-CM | POA: Insufficient documentation

## 2014-12-10 DIAGNOSIS — Z7982 Long term (current) use of aspirin: Secondary | ICD-10-CM | POA: Diagnosis not present

## 2014-12-10 DIAGNOSIS — Z7902 Long term (current) use of antithrombotics/antiplatelets: Secondary | ICD-10-CM | POA: Diagnosis not present

## 2014-12-10 DIAGNOSIS — R51 Headache: Principal | ICD-10-CM | POA: Insufficient documentation

## 2014-12-10 DIAGNOSIS — I10 Essential (primary) hypertension: Secondary | ICD-10-CM | POA: Diagnosis not present

## 2014-12-10 DIAGNOSIS — E119 Type 2 diabetes mellitus without complications: Secondary | ICD-10-CM | POA: Diagnosis not present

## 2014-12-10 DIAGNOSIS — F17210 Nicotine dependence, cigarettes, uncomplicated: Secondary | ICD-10-CM | POA: Insufficient documentation

## 2014-12-10 DIAGNOSIS — G459 Transient cerebral ischemic attack, unspecified: Secondary | ICD-10-CM | POA: Insufficient documentation

## 2014-12-10 DIAGNOSIS — R519 Headache, unspecified: Secondary | ICD-10-CM | POA: Diagnosis present

## 2014-12-10 HISTORY — DX: Cerebral infarction, unspecified: I63.9

## 2014-12-10 HISTORY — DX: Gastro-esophageal reflux disease without esophagitis: K21.9

## 2014-12-10 HISTORY — DX: Cardiac murmur, unspecified: R01.1

## 2014-12-10 HISTORY — DX: Hepatitis a without hepatic coma: B15.9

## 2014-12-10 HISTORY — DX: Transient cerebral ischemic attack, unspecified: G45.9

## 2014-12-10 HISTORY — DX: Type 2 diabetes mellitus without complications: E11.9

## 2014-12-10 HISTORY — DX: Essential (primary) hypertension: I10

## 2014-12-10 HISTORY — DX: Personal history of other diseases of the digestive system: Z87.19

## 2014-12-10 LAB — CBC
HEMATOCRIT: 35.7 % — AB (ref 36.0–46.0)
HEMOGLOBIN: 12 g/dL (ref 12.0–15.0)
MCH: 30.5 pg (ref 26.0–34.0)
MCHC: 33.6 g/dL (ref 30.0–36.0)
MCV: 90.8 fL (ref 78.0–100.0)
Platelets: 289 10*3/uL (ref 150–400)
RBC: 3.93 MIL/uL (ref 3.87–5.11)
RDW: 13.4 % (ref 11.5–15.5)
WBC: 15.3 10*3/uL — AB (ref 4.0–10.5)

## 2014-12-10 LAB — BASIC METABOLIC PANEL
ANION GAP: 10 (ref 5–15)
BUN: 20 mg/dL (ref 6–20)
CHLORIDE: 103 mmol/L (ref 101–111)
CO2: 25 mmol/L (ref 22–32)
Calcium: 9.5 mg/dL (ref 8.9–10.3)
Creatinine, Ser: 0.96 mg/dL (ref 0.44–1.00)
GFR calc Af Amer: >60 mL/min (ref >60)
GFR calc non Af Amer: >60 mL/min (ref >60)
GLUCOSE: 162 mg/dL — AB (ref 65–99)
POTASSIUM: 4.4 mmol/L (ref 3.5–5.1)
Sodium: 138 mmol/L (ref 135–145)

## 2014-12-10 LAB — GLUCOSE, CAPILLARY
GLUCOSE-CAPILLARY: 200 mg/dL — AB (ref 65–99)
GLUCOSE-CAPILLARY: 305 mg/dL — AB (ref 65–99)

## 2014-12-10 MED ORDER — ATORVASTATIN CALCIUM 80 MG PO TABS
80.0000 mg | ORAL_TABLET | Freq: Every day | ORAL | Status: DC
  Administered 2014-12-10 – 2014-12-11 (×2): 80 mg via ORAL
  Filled 2014-12-10 (×2): qty 1

## 2014-12-10 MED ORDER — METOPROLOL TARTRATE 1 MG/ML IV SOLN: 2.0000 mg | INTRAVENOUS | Status: DC | PRN

## 2014-12-10 MED ORDER — SODIUM CHLORIDE 0.9 % IV SOLN
INTRAVENOUS | Status: DC
  Administered 2014-12-10 – 2014-12-11 (×2): via INTRAVENOUS

## 2014-12-10 MED ORDER — METFORMIN HCL 500 MG PO TABS
1000.0000 mg | ORAL_TABLET | Freq: Two times a day (BID) | ORAL | Status: DC
  Administered 2014-12-10 – 2014-12-12 (×4): 1000 mg via ORAL
  Filled 2014-12-10 (×4): qty 2

## 2014-12-10 MED ORDER — ACETAMINOPHEN 325 MG PO TABS
325.0000 mg | ORAL_TABLET | ORAL | Status: DC | PRN
  Administered 2014-12-10 – 2014-12-11 (×3): 650 mg via ORAL
  Filled 2014-12-10 (×4): qty 2

## 2014-12-10 MED ORDER — GUAIFENESIN-DM 100-10 MG/5ML PO SYRP
15.0000 mL | ORAL_SOLUTION | ORAL | Status: DC | PRN
  Administered 2014-12-10: 15 mL via ORAL
  Filled 2014-12-10: qty 15

## 2014-12-10 MED ORDER — LISINOPRIL 10 MG PO TABS
10.0000 mg | ORAL_TABLET | Freq: Every day | ORAL | Status: DC
  Administered 2014-12-11 – 2014-12-12 (×2): 10 mg via ORAL
  Filled 2014-12-10 (×2): qty 1

## 2014-12-10 MED ORDER — DEXAMETHASONE SODIUM PHOSPHATE 10 MG/ML IJ SOLN
10.0000 mg | Freq: Once | INTRAMUSCULAR | Status: AC
  Administered 2014-12-10: 10 mg via INTRAVENOUS
  Filled 2014-12-10: qty 1

## 2014-12-10 MED ORDER — HYDRALAZINE HCL 20 MG/ML IJ SOLN: 5.0000 mg | INTRAMUSCULAR | Status: DC | PRN

## 2014-12-10 MED ORDER — ENOXAPARIN SODIUM 40 MG/0.4ML ~~LOC~~ SOLN
40.0000 mg | SUBCUTANEOUS | Status: DC
  Administered 2014-12-11 – 2014-12-12 (×2): 40 mg via SUBCUTANEOUS
  Filled 2014-12-10 (×2): qty 0.4

## 2014-12-10 MED ORDER — MORPHINE SULFATE (PF) 2 MG/ML IV SOLN
2.0000 mg | INTRAVENOUS | Status: DC | PRN
  Administered 2014-12-11: 2 mg via INTRAVENOUS
  Filled 2014-12-10: qty 1

## 2014-12-10 MED ORDER — ALUM & MAG HYDROXIDE-SIMETH 200-200-20 MG/5ML PO SUSP: 15.0000 mL | ORAL | Status: DC | PRN

## 2014-12-10 MED ORDER — LABETALOL HCL 5 MG/ML IV SOLN
10.0000 mg | INTRAVENOUS | Status: DC | PRN
  Administered 2014-12-11: 10 mg via INTRAVENOUS
  Filled 2014-12-10: qty 4

## 2014-12-10 MED ORDER — INSULIN ASPART 100 UNIT/ML ~~LOC~~ SOLN
0.0000 [IU] | Freq: Three times a day (TID) | SUBCUTANEOUS | Status: DC
  Administered 2014-12-10: 2 [IU] via SUBCUTANEOUS
  Administered 2014-12-11 (×2): 3 [IU] via SUBCUTANEOUS
  Administered 2014-12-11: 5 [IU] via SUBCUTANEOUS
  Administered 2014-12-12: 3 [IU] via SUBCUTANEOUS

## 2014-12-10 MED ORDER — DICYCLOMINE HCL 10 MG PO CAPS: 10.0000 mg | ORAL_CAPSULE | Freq: Every day | ORAL | Status: DC | PRN

## 2014-12-10 MED ORDER — GABAPENTIN 300 MG PO CAPS
300.0000 mg | ORAL_CAPSULE | Freq: Three times a day (TID) | ORAL | Status: DC
  Administered 2014-12-10 – 2014-12-12 (×6): 300 mg via ORAL
  Filled 2014-12-10 (×6): qty 1

## 2014-12-10 MED ORDER — DEXAMETHASONE 4 MG PO TABS
4.0000 mg | ORAL_TABLET | Freq: Once | ORAL | Status: AC
  Administered 2014-12-10: 4 mg via ORAL
  Filled 2014-12-10: qty 1

## 2014-12-10 MED ORDER — PHENOL 1.4 % MT LIQD: 1.0000 | OROMUCOSAL | Status: DC | PRN

## 2014-12-10 MED ORDER — ASPIRIN EC 81 MG PO TBEC
81.0000 mg | DELAYED_RELEASE_TABLET | Freq: Every day | ORAL | Status: DC
  Administered 2014-12-11 – 2014-12-12 (×2): 81 mg via ORAL
  Filled 2014-12-10 (×2): qty 1

## 2014-12-10 MED ORDER — METOCLOPRAMIDE HCL 5 MG/ML IJ SOLN
10.0000 mg | Freq: Once | INTRAMUSCULAR | Status: AC
  Administered 2014-12-10: 10 mg via INTRAVENOUS
  Filled 2014-12-10: qty 2

## 2014-12-10 MED ORDER — OXYCODONE HCL 5 MG PO TABS
5.0000 mg | ORAL_TABLET | ORAL | Status: DC | PRN
  Administered 2014-12-11 (×3): 5 mg via ORAL
  Filled 2014-12-10 (×3): qty 1

## 2014-12-10 MED ORDER — PANTOPRAZOLE SODIUM 40 MG PO TBEC
40.0000 mg | DELAYED_RELEASE_TABLET | Freq: Every day | ORAL | Status: DC
  Administered 2014-12-11 – 2014-12-12 (×2): 40 mg via ORAL
  Filled 2014-12-10 (×2): qty 1

## 2014-12-10 MED ORDER — DIPHENHYDRAMINE HCL 50 MG/ML IJ SOLN
25.0000 mg | Freq: Once | INTRAMUSCULAR | Status: AC
  Administered 2014-12-10: 25 mg via INTRAVENOUS
  Filled 2014-12-10: qty 1

## 2014-12-10 MED ORDER — POTASSIUM CHLORIDE CRYS ER 20 MEQ PO TBCR: 20.0000 meq | EXTENDED_RELEASE_TABLET | Freq: Once | ORAL | Status: DC

## 2014-12-10 MED ORDER — ALPRAZOLAM 0.5 MG PO TABS
0.5000 mg | ORAL_TABLET | Freq: Two times a day (BID) | ORAL | Status: DC
  Administered 2014-12-10 – 2014-12-12 (×4): 0.5 mg via ORAL
  Filled 2014-12-10 (×4): qty 1

## 2014-12-10 MED ORDER — ONDANSETRON HCL 4 MG/2ML IJ SOLN
4.0000 mg | Freq: Four times a day (QID) | INTRAMUSCULAR | Status: DC | PRN
Start: 2014-12-10 — End: 2014-12-12

## 2014-12-10 MED ORDER — FENOFIBRATE 54 MG PO TABS
54.0000 mg | ORAL_TABLET | Freq: Every day | ORAL | Status: DC
  Administered 2014-12-11 – 2014-12-12 (×2): 54 mg via ORAL
  Filled 2014-12-10 (×2): qty 1

## 2014-12-10 MED ORDER — ACETAMINOPHEN 325 MG RE SUPP: 325.0000 mg | RECTAL | Status: DC | PRN

## 2014-12-10 MED ORDER — NICOTINE 21 MG/24HR TD PT24
21.0000 mg | MEDICATED_PATCH | Freq: Every day | TRANSDERMAL | Status: DC
  Administered 2014-12-10 – 2014-12-12 (×3): 21 mg via TRANSDERMAL
  Filled 2014-12-10 (×3): qty 1

## 2014-12-10 NOTE — Telephone Encounter (Signed)
Phone call placed to pt. to follow-up on status of headaches this AM.   Reported she awakened during the night about 1:00 AM, "with the worst headache I have had yet."  Stated she took ES Tylenol, and then took a Tramadol to get the pain to ease off.  Stated headache is about 4/10 at this time.  Reported blurry vision in the left eye, during the headache.  Reported her entire right hand is tingling this AM, and compared that to "minor tingling in both hands yesterday."   Denied any weakness in extremities.  Reported she noticed being "wobbly" yesterday, after riding in a car for a period of time.  Denied any unsteadiness in lower extremities this AM.  Reported BP this AM of 116/90.  Denied any dyspnea, or any swallowing issues.  Advise will report symptoms to Dr. Donnetta Hutching, and call pt. back with recommendations.

## 2014-12-10 NOTE — ED Provider Notes (Signed)
CSN: [id number]     Arrival date & time 12/10/14  1157 History   First MD Initiated Contact with Patient 12/10/14 1310     Chief Complaint  Patient presents with  . Headache     (Consider location/radiation/quality/duration/timing/severity/associated sxs/prior Treatment) Patient is a 59 y.o. female presenting with headaches. The history is provided by the patient.  Headache Pain location:  Frontal Quality:  Sharp Radiates to:  Does not radiate Onset quality:  Sudden Timing:  Sporadic Progression:  Worsening Chronicity:  New Similar to prior headaches: no   Context comment:  While sleeping Relieved by: Tylenol, Motrin, Tramadol. Worsened by:  Nothing Associated symptoms: no abdominal pain, no cough, no fever and no vomiting   Risk factors: no anger and no family hx of [place]     Past Medical History  Diagnosis Date  . Diabetes mellitus   . Hypercholesteremia   . Obesity   . Chest pain, unspecified   . Reflux esophagitis   . Cervicalgia    Past Surgical History  Procedure Laterality Date  . Cholecystectomy    . Partial hysterectomy    . Endarterectomy Left 12/02/2014    Procedure: ENDARTERECTOMY CAROTID;  Surgeon: Rosetta Posner, MD;  Location: [facility] OR;  Service: Vascular;  Laterality: Left;   Family History  Problem Relation Age of Onset  . COPD Father   . Congestive Heart Failure Mother   . Cancer Brother    Social History  Substance Use Topics  . Smoking status: Current Every Day Smoker -- 2.00 packs/day    Types: Cigarettes  . Smokeless tobacco: None     Comment: 1 1/2 pack a day. Smoking since age 85  . Alcohol Use: No   OB History    No data available     Review of Systems  Constitutional: Negative for fever.  Respiratory: Negative for cough and shortness of breath.   Gastrointestinal: Negative for vomiting and abdominal pain.  Neurological: Positive for headaches.  All other systems reviewed and are negative.     Allergies  Codeine and Sulfa  antibiotics  Home Medications   Prior to Admission medications   Medication Sig Start Date End Date Taking? Authorizing Provider  ALPRAZolam Duanne Moron) 1 MG tablet Take 0.5 mg by mouth 2 (two) times daily.     Historical Provider, MD  aspirin EC 81 MG tablet Take 1 tablet (81 mg total) by mouth daily. Please continue Aspirin and plavix for 6 months, then plavix daily 12/03/14   Ripudeep Krystal Eaton, MD  atorvastatin (LIPITOR) 80 MG tablet Take 1 tablet (80 mg total) by mouth at bedtime. 12/03/14   Ripudeep Krystal Eaton, MD  clopidogrel (PLAVIX) 75 MG tablet Take 1 tablet (75 mg total) by mouth daily. 12/03/14   Ripudeep Krystal Eaton, MD  dicyclomine (BENTYL) 10 MG capsule Take 1 capsule (10 mg total) by mouth 4 (four) times daily -  before meals and at bedtime. Take 1 capsule (10 mg total) by mouth 3 (three) times daily - before meals Patient taking differently: Take 10 mg by mouth daily as needed for spasms. Take 1 capsule (10 mg total) by mouth 3 (three) times daily - before meals 07/17/13   Rogene Houston, MD  fenofibrate 54 MG tablet Take 1 tablet (54 mg total) by mouth daily. 12/03/14   Ripudeep Krystal Eaton, MD  gabapentin (NEURONTIN) 300 MG capsule Take 300 mg by mouth 3 (three) times daily.    Historical Provider, MD  lisinopril (PRINIVIL,ZESTRIL) 20 MG  tablet Take 0.5 tablets (10 mg total) by mouth daily. 12/03/14   Ripudeep Krystal Eaton, MD  metFORMIN (GLUCOPHAGE) 1000 MG tablet Take 1 tablet (1,000 mg total) by mouth 2 (two) times daily with a meal. 12/03/14   Ripudeep K Rai, MD  nicotine (NICODERM CQ - DOSED IN MG/24 HOURS) 21 mg/24hr patch Place 1 patch (21 mg total) onto the skin daily. 12/03/14   Ripudeep Krystal Eaton, MD  oxyCODONE (OXY IR/ROXICODONE) 5 MG immediate release tablet Take 1 tablet (5 mg total) by mouth every 4 (four) hours as needed for moderate pain. 12/03/14   Ulyses Amor, PA-C  pantoprazole (PROTONIX) 40 MG tablet Take 1 tablet (40 mg total) by mouth daily. 12/03/14   Ripudeep Krystal Eaton, MD  phenazopyridine (PYRIDIUM)  200 MG tablet Take 1 tablet (200 mg total) by mouth 3 (three) times daily as needed for pain. Patient not taking: Reported on 11/28/2014 10/20/13   Veryl Speak, MD  traMADol (ULTRAM) 50 MG tablet Take 1 tablet (50 mg total) by mouth every 6 (six) hours as needed for moderate pain. 12/03/14   Ulyses Amor, PA-C   BP 134/83 mmHg  Pulse 95  Temp(Src) 97.9 F (36.6 C) (Oral)  Resp 18  SpO2 97% Physical Exam  Constitutional: She is oriented to person, place, and time. She appears well-developed and well-nourished. No distress.  HENT:  Head: Normocephalic and atraumatic.    Mouth/Throat: Oropharynx is clear and moist.  Eyes: EOM are normal. Pupils are equal, round, and reactive to light.  Neck: Normal range of motion. Neck supple.  Cardiovascular: Normal rate and regular rhythm.  Exam reveals no friction rub.   No murmur heard. Pulmonary/Chest: Effort normal and breath sounds normal. No respiratory distress. She has no wheezes. She has no rales.  Abdominal: Soft. She exhibits no distension. There is no tenderness. There is no rebound.  Musculoskeletal: Normal range of motion. She exhibits no edema.  Neurological: She is alert and oriented to person, place, and time.  Skin: She is not diaphoretic.  Nursing note and vitals reviewed.   ED Course  Procedures (including critical care time) Labs Review Labs Reviewed  CBC - Abnormal; Notable for the following:    WBC 15.3 (*)    HCT 35.7 (*)    All other components within normal limits  BASIC METABOLIC PANEL - Abnormal; Notable for the following:    Glucose, Bld 162 (*)    All other components within normal limits    Imaging Review Ct Head Wo Contrast  12/10/2014   CLINICAL DATA:  Acute left frontal headache.  EXAM: CT HEAD WITHOUT CONTRAST  TECHNIQUE: Contiguous axial images were obtained from the base of the skull through the vertex without intravenous contrast.  COMPARISON:  CT scan of November 28, 2014.  FINDINGS: Bony calvarium  appears intact. 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 No mass effect or midline shift is noted. Ventricular size is within normal limits. There is no evidence of mass lesion, hemorrhage or acute infarction.  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 No acute intracranial abnormality seen.   Electronically Signed   By: Marijo Conception, M.D.   On: 12/10/2014 15:08   I have personally reviewed and evaluated these images and lab results as part of my medical decision-making.   EKG Interpretation None      MDM   Final diagnoses:  Acute nonintractable headache, unspecified headache type    59 year old female here with headaches. Has had intermittent headaches  for the past several days. Had a recent carotid endarterectomy and was discharged one week ago. She's currently on Plavix. No trauma. Had vomiting a few days ago but did not have an associated headache. She has some mild blurred vision. She denies any fever, cough, shortness of breath, diarrhea, vomiting with her headaches. She does not have chronic history of headaches. She states her left frontal and sharp. The do not radiate. She's had some relief with Tylenol ibuprofen and tramadol. Here she is neurologically intact. Her carotid endarterectomy scar looks well. We'll scan her head since she's on blood thinners and had recent surgery and will give headache cocktail.  Dr. Scot Dock with Vascular Surgery evaluated her. He knew she was coming over from the office. She will be observed overnight for possible reperfusion bleed, which is a rare complication from a CEA.   CT ok. Admitted by Dr. Scot Dock.  Evelina Bucy, MD 12/10/14 [phone]

## 2014-12-10 NOTE — ED Notes (Signed)
Attempted report 

## 2014-12-10 NOTE — ED Notes (Signed)
Pt sts left sided HA x 2 days; pt sts had recent carotid sx on same side; site WNL; pt sts some N/V and blurry vision

## 2014-12-10 NOTE — H&P (Addendum)
[facility] [place]  Patient name: Grace Hess MRN: [id number] DOB: 08-Jul-1955 Sex: female  REASON FOR ADMISSION: Left frontal headache  HPI: Grace Hess is a 59 y.o. female who had presented with asymptomatic left carotid stenosis. Initially she had presented to [facility] on 11/27/2014 with an episode of paresthesias in the right hand and expressive aphasia. A CT angiogram showed a 75% left carotid stenosis. There was no significant right carotid stenosis. The patient underwent a left carotid endarterectomy with Dacron patch angioplasty by Dr. Sherren Mocha Early on 12/02/2014. The stenosis was noted to be 80%. The patient did well postoperatively and was discharged on postoperative day #1. In reviewing the records it appears that the blood pressure was well controlled postoperatively. Approximately 3 days after discharge the patient developed a left frontal headache. This resolved the following day but returned yesterday when she developed a severe left frontal headache. She called the office and was sent to the emergency department. Currently she states that her headache has improved although she does have a dull left frontal headache. She denies any focal weakness or paresthesias. She denies any expressive or receptive aphasia.  She is on aspirin. She is on a statin.  Past Medical History  Diagnosis Date  . Diabetes mellitus   . Hypercholesteremia   . Obesity   . Chest pain, unspecified   . Reflux esophagitis   . Cervicalgia     Family History  Problem Relation Age of Onset  . COPD Father   . Congestive Heart Failure Mother   . Cancer Brother     SOCIAL HISTORY: Social History  Substance Use Topics  . Smoking status: Current Every Day Smoker -- 2.00 packs/day    Types: Cigarettes  . Smokeless tobacco: Not on file     Comment: 1 1/2 pack a day. Smoking since age 37  . Alcohol Use: No    Allergies  Allergen Reactions  . Codeine Shortness Of  Breath  . Sulfa Antibiotics     Childhood allergy    Current Facility-Administered Medications  Medication Dose Route Frequency Provider Last Rate Last Dose  . dexamethasone (DECADRON) injection 10 mg  10 mg Intravenous Once Evelina Bucy, MD      . diphenhydrAMINE (BENADRYL) injection 25 mg  25 mg Intravenous Once Evelina Bucy, MD      . metoCLOPramide (REGLAN) injection 10 mg  10 mg Intravenous Once Evelina Bucy, MD       Current Outpatient Prescriptions  Medication Sig Dispense Refill  . ALPRAZolam (XANAX) 1 MG tablet Take 0.5 mg by mouth 2 (two) times daily.     Marland Kitchen aspirin EC 81 MG tablet Take 1 tablet (81 mg total) by mouth daily. Please continue Aspirin and plavix for 6 months, then plavix daily 30 tablet 6  . atorvastatin (LIPITOR) 80 MG tablet Take 1 tablet (80 mg total) by mouth at bedtime. 30 tablet 3  . clopidogrel (PLAVIX) 75 MG tablet Take 1 tablet (75 mg total) by mouth daily. 30 tablet 3  . dicyclomine (BENTYL) 10 MG capsule Take 1 capsule (10 mg total) by mouth 4 (four) times daily -  before meals and at bedtime. Take 1 capsule (10 mg total) by mouth 3 (three) times daily - before meals (Patient taking differently: Take 10 mg by mouth daily as needed for spasms. Take 1 capsule (10 mg total) by mouth 3 (three) times daily - before meals) 90 capsule 2  . fenofibrate 54 MG  tablet Take 1 tablet (54 mg total) by mouth daily. 30 tablet 2  . gabapentin (NEURONTIN) 300 MG capsule Take 300 mg by mouth 3 (three) times daily.    Marland Kitchen lisinopril (PRINIVIL,ZESTRIL) 20 MG tablet Take 0.5 tablets (10 mg total) by mouth daily.    . metFORMIN (GLUCOPHAGE) 1000 MG tablet Take 1 tablet (1,000 mg total) by mouth 2 (two) times daily with a meal. 60 tablet 3  . nicotine (NICODERM CQ - DOSED IN MG/24 HOURS) 21 mg/24hr patch Place 1 patch (21 mg total) onto the skin daily. 28 patch 2  . oxyCODONE (OXY IR/ROXICODONE) 5 MG immediate release tablet Take 1 tablet (5 mg total) by mouth every 4 (four) hours as  needed for moderate pain. 30 tablet 0  . pantoprazole (PROTONIX) 40 MG tablet Take 1 tablet (40 mg total) by mouth daily. 30 tablet 4  . phenazopyridine (PYRIDIUM) 200 MG tablet Take 1 tablet (200 mg total) by mouth 3 (three) times daily as needed for pain. (Patient not taking: Reported on 11/28/2014) 6 tablet 0  . traMADol (ULTRAM) 50 MG tablet Take 1 tablet (50 mg total) by mouth every 6 (six) hours as needed for moderate pain. 30 tablet 0    REVIEW OF SYSTEMS: Valu.Nieves ] denotes positive finding; [  ] denotes negative finding CARDIOVASCULAR:  [ ]  chest pain   [ ]  chest pressure   [ ]  palpitations   [ ]  orthopnea   [ ]  dyspnea on exertion   [ ]  claudication   [ ]  rest pain   [ ]  DVT   [ ]  phlebitis PULMONARY:   [ ]  productive cough   [ ]  asthma   [ ]  wheezing NEUROLOGIC:   [ ]  weakness  [ ]  paresthesias  [ ]  aphasia  [ ]  amaurosis  [ ]  dizziness HEMATOLOGIC:   [ ]  bleeding problems   [ ]  clotting disorders MUSCULOSKELETAL:  [ ]  joint pain   [ ]  joint swelling [ ]  leg swelling GASTROINTESTINAL: [ ]   blood in stool  [ ]   hematemesis GENITOURINARY:  [ ]   dysuria  [ ]   hematuria PSYCHIATRIC:  [ ]  history of major depression INTEGUMENTARY:  [ ]  rashes  [ ]  ulcers CONSTITUTIONAL:  [ ]  fever   [ ]  chills  PHYSICAL EXAM: Filed Vitals:   12/10/14 1202  BP: 134/83  Pulse: 95  Temp: 97.9 F (36.6 C)  TempSrc: Oral  Resp: 18  SpO2: 97%   There is no weight on file to calculate BMI. GENERAL: The patient is a well-nourished female, in no acute distress. The vital signs are documented above. CARDIAC: There is a regular rate and rhythm.  VASCULAR: I do not detect carotid bruits. PULMONARY: There is good air exchange bilaterally without wheezing or rales. ABDOMEN: Soft and non-tender with normal pitched bowel sounds.  MUSCULOSKELETAL: There are no major deformities. NEUROLOGIC: No focal weakness or paresthesias are detected. SKIN: There are no ulcers or rashes noted. PSYCHIATRIC: The patient has a  normal affect.  DATA:  Lab Results  Component Value Date   WBC 11.1* 12/03/2014   HGB 12.3 12/03/2014   HCT 36.5 12/03/2014   MCV 89.5 12/03/2014   PLT 191 12/03/2014   Lab Results  Component Value Date   NA 134* 12/03/2014   K 4.5 12/03/2014   CL 102 12/03/2014   CO2 24 12/03/2014   Lab Results  Component Value Date   CREATININE 0.97 12/03/2014   Lab Results  Component  Value Date   INR 0.99 11/28/2014   Lab Results  Component Value Date   HGBA1C 8.6* 11/28/2014   CT OF THE HEAD: Pending  MEDICAL ISSUES:  HEADACHE STATUS POST LEFT CAROTID ENDARTERECTOMY: The patient developed a severe left frontal headache after carotid endarterectomy. She is a proximally 1 week postop. She has been evaluated by the emergency department and a CT scan of the head has been ordered. She has also been ordered Decadron. Given the severity of the headache, I think that there is some concern for reperfusion injury and for this reason the safest approach would be to admit the patient for tight blood pressure control. We'll follow up on her head CT. If her headaches persist, we will need neurology's help. If her headaches resolved and her CT scan of the head is negative she could potentially be discharged in a day or 2.  Deitra Mayo [facility] [place]: [phone]  ADDENDUM: I reviewed her CT scan which shows no evidence of intracranial bleed or acute abnormality.  Deitra Mayo, MD, [place] [phone]

## 2014-12-10 NOTE — Telephone Encounter (Addendum)
Discussed pt's symptoms with Dr. Donnetta Hutching.  Recommended to send pt. To [facility] for evaluation, and to notify the ER and Dr. Scot Dock, on call today.  Phone call to pt.  Advised of recommendation to go to the ER @ [place].  Verb. Understanding.  Notified the Triage nurse  Marye Round) @ [facility] ER, and Dr. Scot Dock, of pt. coming to the ER.

## 2014-12-10 NOTE — ED Notes (Signed)
Ordered carb modified dinner tray.

## 2014-12-10 NOTE — ED Notes (Signed)
Patient transported to CT 

## 2014-12-10 NOTE — ED Notes (Signed)
Vascular MD at bedside.

## 2014-12-10 NOTE — Progress Notes (Signed)
Pt arrived to unit.  VS stable.  Member acclimated to room, shown phone and call light.  Telebox connected.  Box # [id number].  Pt c/o headache 6/10 at this time.  Requesting meal tray.  Nutrition services made aware of need for meal tray.   Refused pain medication at this time.  Will continue to monitor.

## 2014-12-11 DIAGNOSIS — R51 Headache: Secondary | ICD-10-CM

## 2014-12-11 LAB — GLUCOSE, CAPILLARY
GLUCOSE-CAPILLARY: 218 mg/dL — AB (ref 65–99)
GLUCOSE-CAPILLARY: 173 mg/dL — AB (ref 65–99)
Glucose-Capillary: 256 mg/dL — ABNORMAL HIGH (ref 65–99)

## 2014-12-11 MED ORDER — DIPHENHYDRAMINE HCL 50 MG/ML IJ SOLN
12.5000 mg | Freq: Three times a day (TID) | INTRAMUSCULAR | Status: DC
  Administered 2014-12-11 – 2014-12-12 (×3): 12.5 mg via INTRAVENOUS
  Filled 2014-12-11 (×3): qty 1

## 2014-12-11 MED ORDER — PROCHLORPERAZINE EDISYLATE 5 MG/ML IJ SOLN
10.0000 mg | Freq: Three times a day (TID) | INTRAMUSCULAR | Status: DC
  Administered 2014-12-11 – 2014-12-12 (×3): 10 mg via INTRAVENOUS
  Filled 2014-12-11 (×8): qty 2

## 2014-12-11 MED ORDER — KETOROLAC TROMETHAMINE 30 MG/ML IJ SOLN
30.0000 mg | Freq: Three times a day (TID) | INTRAMUSCULAR | Status: DC
  Administered 2014-12-11 – 2014-12-12 (×3): 30 mg via INTRAVENOUS
  Filled 2014-12-11 (×3): qty 1

## 2014-12-11 NOTE — Progress Notes (Signed)
Inpatient Diabetes Program Recommendations  AACE/ADA: New Consensus Statement on Inpatient Glycemic Control (2015)  Target Ranges:  Prepandial:   less than 140 mg/dL      Peak postprandial:   less than 180 mg/dL (1-2 hours)      Critically ill patients:  140 - 180 mg/dL   Review of Glycemic Control  Diabetes history: DM2 Outpatient Diabetes medications: Metformin 1000 mg bid Current orders for Inpatient glycemic control: metformin 1000 mg bid, Novolog sensitive tidwc  Results for CHARYSSE, ISERI (MRN [id number]) as of 12/11/2014 12:35  Ref. Range 12/10/2014 16:29 12/10/2014 20:58 12/11/2014 06:00 12/11/2014 11:19  Glucose-Capillary Latest Ref Range: 65-99 mg/dL 200 (H) 305 (H) 218 (H) 256 (H)  Results for NAEL, HABTE (MRN [id number]) as of 12/11/2014 12:35  Ref. Range 11/28/2014 12:40  Hemoglobin A1C Latest Ref Range: 4.8-5.6 % 8.6 (H)    Inpatient Diabetes Program Recommendations:  Insulin - Basal: Consider addition of Lantus 10 units QHS Insulin - Meal Coverage: Add Novolog 3 units tidwc for meal coverage insulin if pt eats > 50% meal HgbA1C: 8.6% - uncontrolled  Received Decadron IV and 4 mg.  Will benefit from OP Diabetes Education. Will speak with pt this afternoon regarding HgbA1C.  Will continue to follow. Thank you. Lorenda Peck, RD, LDN, CDE Inpatient Diabetes Coordinator [phone]

## 2014-12-11 NOTE — Progress Notes (Signed)
Pt was in bathroom, had evacuated bowels and was getting ready to walk back to bed when Pt describes severe left side headache.  Pt is tearful from pain.  VS check shows BP 180/115 with pulse 124.  Pt given 2 mg IV morphine @ 1409.  Dr. Donnetta Hutching paged.  Per Pt, pain continues to be very high after 15 minutes after morphine administration.  BP recheck 125/93 pulse 110.  Oxycodone 5 mg PO given @ 1428.  Call back rcvd from Dr. Donnetta Hutching, Dr. Donnetta Hutching to assess Pt shortly.  Will continue to monitor pain level. BP recheck  161/98 pulse 105. Dr. Donnetta Hutching at bedside.

## 2014-12-11 NOTE — Progress Notes (Addendum)
[facility] [place]  Subjective  - She states she is feeling better and her HA has gotten much better, still pressure over her eye.     Objective 119/55 74 98.2 F (36.8 C) (Oral) 18 93%  Intake/Output Summary (Last 24 hours) at 12/11/14 0745 Last data filed at 12/10/14 1814  Gross per 24 hour  Intake    240 ml  Output      0 ml  Net    240 ml    Radial artery palpable  Grip 5/5, sensation intact and equal bilaterally Pupils equal, eye motion intact  Heart RRR Lungs non labored breathing   Assessment/Planning: Sever HA s/p left CEA  BP 119/55 this am HA better CT scan no evidence of intracranial bleed or acute abnormality. Discharge plan per Dr. Pryor Montes, [facility] 12/11/2014 7:45 AM --  Laboratory Lab Results:  Recent Labs  12/10/14 1353  WBC 15.3*  HGB 12.0  HCT 35.7*  PLT 289   BMET  Recent Labs  12/10/14 1353  NA 138  K 4.4  CL 103  CO2 25  GLUCOSE 162*  BUN 20  CREATININE 0.96  CALCIUM 9.5    COAG Lab Results  Component Value Date   INR 0.99 11/28/2014   No results found for: PTT    I have examined the patient, reviewed and agree with above.Having severe left side ha currently.  [id number] systolic.  Pain meds and BP control.  Will ask neurology to see   Curt Jews, MD 12/11/2014 2:46 PM

## 2014-12-11 NOTE — Consult Note (Signed)
NEURO HOSPITALIST CONSULT NOTE   Referring physician: Scot Dock   Reason for Consult: HA after CEA  HPI:                                                                                                                                          Grace Hess is an 59 y.o. female patient underwent a left carotid endarterectomy with Dacron patch angioplasty by Dr. Sherren Mocha Early on 12/02/2014. The stenosis was noted to be 80%. The patient did well postoperatively and was discharged on postoperative day #1. In reviewing the records it appears that the blood pressure was well controlled postoperatively. Approximately 3 days after discharge the patient developed a left frontal headache. This resolved the following day but returned yesterday when she developed a severe left frontal headache. She called the office and was sent to the emergency department. Patient has been doing well while in the hospital but today went to the bathroom--had a BM without straining--and had a sudden onset of retro orbital HA on the left side. Stated to be a 9/10, piercing.  She has no neurological symptoms with her HA such as blurred or double vision, numbness, weakness or speech problems.   Past Medical History  Diagnosis Date  . Hypercholesteremia   . Obesity   . Chest pain, unspecified   . Reflux esophagitis   . Cervicalgia   . Hypertension   . Heart murmur   . Type II diabetes mellitus   . History of hiatal hernia   . GERD (gastroesophageal reflux disease)   . Hepatitis A     "related to [employer]"  . Stroke     "they said I had a silent stroke a few months back/MRI" (12/10/2014)  . Kidney stones     "no cysto/OR" (12/10/2014)  . TIA (transient ischemic attack) 11/28/2014    [name] 11/28/2014    Past Surgical History  Procedure Laterality Date  . Endarterectomy Left 12/02/2014    Procedure: ENDARTERECTOMY CAROTID;  Surgeon: Rosetta Posner, MD;  Location: [place];  Service: Vascular;  Laterality:  Left;  . Cholecystectomy open  1978  . Vaginal hysterectomy  1991    "partial"    Family History  Problem Relation Age of Onset  . COPD Father   . Congestive Heart Failure Mother   . Cancer Brother     Social History:  reports that she has been smoking Cigarettes.  She has a 51.25 pack-year smoking history. She has never used smokeless tobacco. She reports that she does not drink alcohol or use illicit drugs.  Allergies  Allergen Reactions  . Codeine Shortness Of Breath  . Latex Itching  . Sulfa Antibiotics     Childhood allergy    MEDICATIONS:  Prior to Admission:  Prescriptions prior to admission  Medication Sig Dispense Refill Last Dose  . ALPRAZolam (XANAX) 1 MG tablet Take 0.5 mg by mouth 2 (two) times daily.    12/10/2014 at Unknown time  . aspirin EC 81 MG tablet Take 1 tablet (81 mg total) by mouth daily. Please continue Aspirin and plavix for 6 months, then plavix daily 30 tablet 6 12/10/2014 at Unknown time  . clopidogrel (PLAVIX) 75 MG tablet Take 1 tablet (75 mg total) by mouth daily. 30 tablet 3 12/10/2014 at Unknown time  . fenofibrate 54 MG tablet Take 1 tablet (54 mg total) by mouth daily. 30 tablet 2 12/10/2014 at Unknown time  . gabapentin (NEURONTIN) 300 MG capsule Take 300 mg by mouth 3 (three) times daily.   12/10/2014 at Unknown time  . lisinopril (PRINIVIL,ZESTRIL) 20 MG tablet Take 0.5 tablets (10 mg total) by mouth daily.   12/10/2014 at Unknown time  . metFORMIN (GLUCOPHAGE) 1000 MG tablet Take 1 tablet (1,000 mg total) by mouth 2 (two) times daily with a meal. 60 tablet 3 12/10/2014 at Unknown time  . oxyCODONE (OXY IR/ROXICODONE) 5 MG immediate release tablet Take 1 tablet (5 mg total) by mouth every 4 (four) hours as needed for moderate pain. 30 tablet 0 12/10/2014 at Unknown time  . pantoprazole (PROTONIX) 40 MG tablet Take 1 tablet (40 mg total)  by mouth daily. 30 tablet 4 12/10/2014 at Unknown time  . traMADol (ULTRAM) 50 MG tablet Take 1 tablet (50 mg total) by mouth every 6 (six) hours as needed for moderate pain. 30 tablet 0 12/10/2014 at Unknown time  . atorvastatin (LIPITOR) 80 MG tablet Take 1 tablet (80 mg total) by mouth at bedtime. 30 tablet 3 12/06/2014  . dicyclomine (BENTYL) 10 MG capsule Take 1 capsule (10 mg total) by mouth 4 (four) times daily -  before meals and at bedtime. Take 1 capsule (10 mg total) by mouth 3 (three) times daily - before meals (Patient not taking: Reported on 12/10/2014) 90 capsule 2 unknown  . nicotine (NICODERM CQ - DOSED IN MG/24 HOURS) 21 mg/24hr patch Place 1 patch (21 mg total) onto the skin daily. 28 patch 2   . phenazopyridine (PYRIDIUM) 200 MG tablet Take 1 tablet (200 mg total) by mouth 3 (three) times daily as needed for pain. (Patient not taking: Reported on 11/28/2014) 6 tablet 0 Not Taking at Unknown time   Scheduled: . ALPRAZolam  0.5 mg Oral BID  . aspirin EC  81 mg Oral Daily  . atorvastatin  80 mg Oral QHS  . prochlorperazine  10 mg Intravenous 3 times per day   And  . ketorolac  30 mg Intravenous 3 times per day   And  . diphenhydrAMINE  12.5 mg Intravenous 3 times per day  . enoxaparin (LOVENOX) injection  40 mg Subcutaneous Q24H  . fenofibrate  54 mg Oral Daily  . gabapentin  300 mg Oral TID  . insulin aspart  0-9 Units Subcutaneous TID WC  . lisinopril  10 mg Oral Daily  . metFORMIN  1,000 mg Oral BID WC  . nicotine  21 mg Transdermal Daily  . pantoprazole  40 mg Oral Daily  . potassium chloride  20-40 mEq Oral Once     ROS:  History obtained from the patient  General ROS: negative for - chills, fatigue, fever, night sweats, weight gain or weight loss Psychological ROS: negative for - behavioral disorder, hallucinations, memory difficulties,  mood swings or suicidal ideation Ophthalmic ROS: negative for - blurry vision, double vision, eye pain or loss of vision ENT ROS: negative for - epistaxis, nasal discharge, oral lesions, sore throat, tinnitus or vertigo Allergy and Immunology ROS: negative for - hives or itchy/watery eyes Hematological and Lymphatic ROS: negative for - bleeding problems, bruising or swollen lymph nodes Endocrine ROS: negative for - galactorrhea, hair pattern changes, polydipsia/polyuria or temperature intolerance Respiratory ROS: negative for - cough, hemoptysis, shortness of breath or wheezing Cardiovascular ROS: negative for - chest pain, dyspnea on exertion, edema or irregular heartbeat Gastrointestinal ROS: negative for - abdominal pain, diarrhea, hematemesis, nausea/vomiting or stool incontinence Genito-Urinary ROS: negative for - dysuria, hematuria, incontinence or urinary frequency/urgency Musculoskeletal ROS: negative for - joint swelling or muscular weakness Neurological ROS: as noted in HPI Dermatological ROS: negative for rash and skin lesion changes   Blood pressure 132/54, pulse 85, temperature 98.4 F (36.9 C), temperature source Oral, resp. rate 17, height 5\' 6"  (1.676 m), weight 71.532 kg (157 lb 11.2 oz), SpO2 95 %.   Neurologic Examination:                                                                                                      HEENT-  Normocephalic, no lesions, without obvious abnormality.  Normal external eye and conjunctiva.  Normal TM's bilaterally.  Normal auditory canals and external ears. Normal external nose, mucus membranes and septum.  Normal pharynx. Cardiovascular- S1, S2 normal, pulses palpable throughout   Lungs- chest clear, no wheezing, rales, normal symmetric air entry Abdomen- normal findings: bowel sounds normal Extremities- no edema Lymph-no adenopathy palpable Musculoskeletal-no joint tenderness, deformity or swelling Skin-warm and dry, no  hyperpigmentation, vitiligo, or suspicious lesions  Neurological Examination Mental Status: Alert, oriented, thought content appropriate.  Speech fluent without evidence of aphasia.  Able to follow 3 step commands without difficulty. Cranial Nerves: II: Discs flat bilaterally; Visual fields grossly normal, pupils equal, round, reactive to light and accommodation III,IV, VI: ptosis not present, extra-ocular motions intact bilaterally V,VII: smile symmetric, facial light touch sensation normal bilaterally VIII: hearing normal bilaterally IX,X: uvula rises symmetrically XI: bilateral shoulder shrug XII: midline tongue extension Motor: Right : Upper extremity   5/5    Left:     Upper extremity   5/5  Lower extremity   5/5     Lower extremity   5/5 Tone and bulk:normal tone throughout; no atrophy noted Sensory: Pinprick and light touch intact throughout, bilaterally Deep Tendon Reflexes: 2+ and symmetric throughout Plantars: Right: downgoing   Left: downgoing Cerebellar: normal finger-to-nose, and normal heel-to-shin test Gait: not tested due to safety      Lab Results: Basic Metabolic Panel:  Recent Labs Lab 12/10/14 1353  NA 138  K 4.4  CL 103  CO2 25  GLUCOSE 162*  BUN 20  CREATININE 0.96  CALCIUM 9.5  Liver Function Tests: No results for input(s): AST, ALT, ALKPHOS, BILITOT, PROT, ALBUMIN in the last 168 hours. No results for input(s): LIPASE, AMYLASE in the last 168 hours. No results for input(s): AMMONIA in the last 168 hours.  CBC:  Recent Labs Lab 12/10/14 1353  WBC 15.3*  HGB 12.0  HCT 35.7*  MCV 90.8  PLT 289    Cardiac Enzymes: No results for input(s): CKTOTAL, CKMB, CKMBINDEX, TROPONINI in the last 168 hours.  Lipid Panel: No results for input(s): CHOL, TRIG, HDL, CHOLHDL, VLDL, LDLCALC in the last 168 hours.  CBG:  Recent Labs Lab 12/10/14 1629 12/10/14 2058 12/11/14 0600 12/11/14 1119  GLUCAP 200* 305* 40* 256*     Microbiology: Results for orders placed or performed during the hospital encounter of 11/28/14  Surgical pcr screen     Status: Abnormal   Collection Time: 12/01/14  9:19 PM  Result Value Ref Range Status   MRSA, PCR NEGATIVE NEGATIVE Final   Staphylococcus aureus POSITIVE (A) NEGATIVE Final    Comment:        The Xpert SA Assay (FDA approved for NASAL specimens in patients over 55 years of age), is one component of a comprehensive surveillance program.  Test performance has been validated by [facility] for patients greater than or equal to 43 year old. It is not intended to diagnose infection nor to guide or monitor treatment.     Coagulation Studies: No results for input(s): LABPROT, INR in the last 72 hours.  Imaging: Ct Head Wo Contrast  12/10/2014   CLINICAL DATA:  Acute left frontal headache.  EXAM: CT HEAD WITHOUT CONTRAST  TECHNIQUE: Contiguous axial images were obtained from the base of the skull through the vertex without intravenous contrast.  COMPARISON:  CT scan of November 28, 2014.  FINDINGS: Bony calvarium appears intact. 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 No mass effect or midline shift is noted. Ventricular size is within normal limits. There is no evidence of mass lesion, hemorrhage or acute infarction.  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 No acute intracranial abnormality seen.   Electronically Signed   By: Marijo Conception, M.D.   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 I think that there may be some benefit to treating them as a migrainous headache, but would not use very vasoconstrictive agents like DHE or triptans. I agree with starting gabapentin as prophylactic. I would favor aggressive BP control and she does have PRN orders.   1) Migraine  cocktail(benadryl/compazine/toradol) PRN 2) continue gabapentin 300mg  TID 3) BP control 4) Will continue to follow.    Roland Rack, MD [facility] [phone]  If 7pm- 7am, please page neurology on call as listed in [place].

## 2014-12-12 ENCOUNTER — Telehealth: Payer: Self-pay | Admitting: Vascular Surgery

## 2014-12-12 DIAGNOSIS — R51 Headache: Secondary | ICD-10-CM | POA: Diagnosis not present

## 2014-12-12 LAB — GLUCOSE, CAPILLARY
Glucose-Capillary: 227 mg/dL — ABNORMAL HIGH (ref 65–99)
Glucose-Capillary: 174 mg/dL — ABNORMAL HIGH (ref 65–99)

## 2014-12-12 MED ORDER — DIPHENHYDRAMINE HCL 25 MG PO TABS: 12.5000 mg | ORAL_TABLET | Freq: Four times a day (QID) | ORAL | Status: DC | PRN

## 2014-12-12 MED ORDER — KETOROLAC TROMETHAMINE 10 MG PO TABS: 10.0000 mg | ORAL_TABLET | Freq: Four times a day (QID) | ORAL | Status: DC | PRN

## 2014-12-12 NOTE — Discharge Summary (Signed)
Discharge Summary    Grace Hess 04/13/1955 59 y.o. female  [id number]  Admission Date: 12/10/2014  Discharge Date: 12/12/14  Physician: Angelia Mould, MD  Admission Diagnosis: Acute nonintractable headache, unspecified headache type [R51]   HPI:   This is a 59 y.o. female who had presented with asymptomatic left carotid stenosis. Initially she had presented to [facility] on 11/27/2014 with an episode of paresthesias in the right hand and expressive aphasia. A CT angiogram showed a 75% left carotid stenosis. There was no significant right carotid stenosis. The patient underwent a left carotid endarterectomy with Dacron patch angioplasty by Dr. Sherren Mocha Early on 12/02/2014. The stenosis was noted to be 80%. The patient did well postoperatively and was discharged on postoperative day #1. In reviewing the records it appears that the blood pressure was well controlled postoperatively. Approximately 3 days after discharge the patient developed a left frontal headache. This resolved the following day but returned yesterday when she developed a severe left frontal headache. She called the office and was sent to the emergency department. Currently she states that her headache has improved although she does have a dull left frontal headache. She denies any focal weakness or paresthesias. She denies any expressive or receptive aphasia.  Hospital Course:  The patient was admitted to the hospital from the ED with severe headache s/p carotid endarterectomy ~ 1 week post operatively.  She did have a CT scan, which did not show any evidence of intracranial bleed or acute abnormality.  She was given decadron.  She was kept under tight BP control.  Neurology was consulted.  By HD 1, she did have a palpable radial artery pulse and neuro was intact.    It was suspected she had reperfusion headaches and felt there would be benefit to treating them as migrainous headache, but would not use  very vasoconstrictive agents like DHE or triptans.  Gabapentin was started back as a  prophylactic.    Throughout hospitalization, systolic BP running mainly 130's-140's, which is acceptable per Dr. Donnetta Hutching.  The remainder of the hospital course consisted of increasing mobilization and increasing intake of solids without difficulty.  CBC    Component Value Date/Time   WBC 15.3* 12/10/2014 1353   RBC 3.93 12/10/2014 1353   HGB 12.0 12/10/2014 1353   HCT 35.7* 12/10/2014 1353   PLT 289 12/10/2014 1353   MCV 90.8 12/10/2014 1353   MCH 30.5 12/10/2014 1353   MCHC 33.6 12/10/2014 1353   RDW 13.4 12/10/2014 1353   LYMPHSABS 3.7 11/28/2014 1240   MONOABS 0.7 11/28/2014 1240   EOSABS 0.3 11/28/2014 1240   BASOSABS 0.0 11/28/2014 1240    BMET    Component Value Date/Time   NA 138 12/10/2014 1353   K 4.4 12/10/2014 1353   CL 103 12/10/2014 1353   CO2 25 12/10/2014 1353   GLUCOSE 162* 12/10/2014 1353   BUN 20 12/10/2014 1353   CREATININE 0.96 12/10/2014 1353   CALCIUM 9.5 12/10/2014 1353   GFRNONAA >60 12/10/2014 1353   GFRAA >60 12/10/2014 1353      Discharge Instructions    CAROTID [place]: Call MD for difficulty swallowing or speaking; weakness in arms or legs that is a new symtom; severe headache.  If you have increased swelling in the neck and/or  are having difficulty breathing, CALL [phone]    Complete by:  As directed      Call MD for:  redness, tenderness, or signs of infection (pain, swelling, bleeding, redness,  odor or green/yellow discharge around incision site)    Complete by:  As directed      Call MD for:  severe or increased pain, loss or decreased feeling  in affected limb(s)    Complete by:  As directed      Call MD for:  temperature >100.5    Complete by:  As directed      Discharge instructions    Complete by:  As directed   Resume previous discharge instructions.     Resume previous diet    Complete by:  As directed            Discharge Diagnosis:  Acute  nonintractable headache, unspecified headache type [R51]  Secondary Diagnosis: Patient Active Problem List   Diagnosis Date Noted  . Headache 12/10/2014  . Carotid artery disease 12/01/2014  . TIA (transient ischemic attack) 11/28/2014  . DM type 2 (diabetes mellitus, type 2) 11/28/2014  . Tobacco use disorder 11/28/2014  . HYPERCHOLESTEROLEMIA  IIA 09/13/2008  . DEPRESSION 09/13/2008  . Essential hypertension 09/13/2008  . GERD 09/13/2008  . CHEST PAIN-UNSPECIFIED 09/13/2008   Past Medical History  Diagnosis Date  . Hypercholesteremia   . Obesity   . Chest pain, unspecified   . Reflux esophagitis   . Cervicalgia   . Hypertension   . Heart murmur   . Type II diabetes mellitus   . History of hiatal hernia   . GERD (gastroesophageal reflux disease)   . Hepatitis A     "related to [employer]"  . Stroke     "they said I had a silent stroke a few months back/MRI" (12/10/2014)  . Kidney stones     "no cysto/OR" (12/10/2014)  . TIA (transient ischemic attack) 11/28/2014    [name] 11/28/2014       Medication List    TAKE these medications        ALPRAZolam 1 MG tablet  Commonly known as:  XANAX  Take 0.5 mg by mouth 2 (two) times daily.     aspirin EC 81 MG tablet  Take 1 tablet (81 mg total) by mouth daily. Please continue Aspirin and plavix for 6 months, then plavix daily     atorvastatin 80 MG tablet  Commonly known as:  LIPITOR  Take 1 tablet (80 mg total) by mouth at bedtime.     clopidogrel 75 MG tablet  Commonly known as:  PLAVIX  Take 1 tablet (75 mg total) by mouth daily.     dicyclomine 10 MG capsule  Commonly known as:  BENTYL  Take 1 capsule (10 mg total) by mouth 4 (four) times daily -  before meals and at bedtime. Take 1 capsule (10 mg total) by mouth 3 (three) times daily - before meals     diphenhydrAMINE 25 MG tablet  Commonly known as:  BENADRYL  Take 0.5 tablets (12.5 mg total) by mouth every 6 (six) hours as needed.     fenofibrate 54 MG  tablet  Take 1 tablet (54 mg total) by mouth daily.     gabapentin 300 MG capsule  Commonly known as:  NEURONTIN  Take 300 mg by mouth 3 (three) times daily.     ketorolac 10 MG tablet  Commonly known as:  TORADOL  Take 1 tablet (10 mg total) by mouth every 6 (six) hours as needed.     lisinopril 20 MG tablet  Commonly known as:  PRINIVIL,ZESTRIL  Take 0.5 tablets (10 mg total) by mouth daily.  metFORMIN 1000 MG tablet  Commonly known as:  GLUCOPHAGE  Take 1 tablet (1,000 mg total) by mouth 2 (two) times daily with a meal.     nicotine 21 mg/24hr patch  Commonly known as:  NICODERM CQ - dosed in mg/24 hours  Place 1 patch (21 mg total) onto the skin daily.     oxyCODONE 5 MG immediate release tablet  Commonly known as:  Oxy IR/ROXICODONE  Take 1 tablet (5 mg total) by mouth every 4 (four) hours as needed for moderate pain.     pantoprazole 40 MG tablet  Commonly known as:  PROTONIX  Take 1 tablet (40 mg total) by mouth daily.     phenazopyridine 200 MG tablet  Commonly known as:  PYRIDIUM  Take 1 tablet (200 mg total) by mouth 3 (three) times daily as needed for pain.     traMADol 50 MG tablet  Commonly known as:  ULTRAM  Take 1 tablet (50 mg total) by mouth every 6 (six) hours as needed for moderate pain.        Prescriptions given: 1.  Toradol 10mg  q6h prn pain #20 NR 2.   Benadryl 12.5 mg q6h prn pain #20 NR  Disposition: home  Patient's condition: is Good  Follow up: 1. Dr. Donnetta Hutching in 1-2 weeks   Leontine Locket, PA-C [facility] [phone] 12/12/2014  9:08 AM

## 2014-12-12 NOTE — Progress Notes (Signed)
12/12/2014 11:08 AM D/c avs form, medications already taken today and those due this evening given and explained to patient. Follow up appointments and when to call MD reviewed. RX reviewed. D/c iv. D/c tele. D/c home per orders.  Flippin, Arville Lime

## 2014-12-12 NOTE — Progress Notes (Signed)
Spoke with neurology PA regarding discharge medications.  Suggested Toradol and Bendadryl and would not discharge her home on Reglan.  Rx's given for Toradol 10mg  q6h prn #20 NR and Benadryl 25mg  1/2 tab q6h prn # [id number].   Hess, Grace 12/12/2014 9:07 AM

## 2014-12-12 NOTE — Progress Notes (Signed)
Subjective: Feels groggy but no HA.    Exam: Filed Vitals:   12/12/14 0441  BP: 135/60  Pulse: 65  Temp: 97.5 F (36.4 C)  Resp: 16    HEENT-  Normocephalic, no lesions, without obvious abnormality.  Normal external eye and conjunctiva.  Normal TM's bilaterally.  Normal auditory canals and external ears. Normal external nose, mucus membranes and septum.  Normal pharynx. Cardiovascular- S1, S2 normal, pulses palpable throughout   Lungs- chest clear, no wheezing, rales, normal symmetric air entry Abdomen- normal findings: bowel sounds normal Extremities- no edema Lymph-no adenopathy palpable Musculoskeletal-no joint tenderness, deformity or swelling Skin-warm and dry, no hyperpigmentation, vitiligo, or suspicious lesions    Gen: In bed, NAD MS: alert and oriented, follows all commands. Speech clear.  CN: intact grossly Motor: moving all extremities well with full strength Sensory grossly intact DTR: 2+ throughout  Pertinent Labs: none  Etta Quill PA-C [facility] [phone]  Impression: 59 YO female with reperfusion HA S/P CEA.  No further HA after on migraine cocktail. I would expect her headaches to improve over the coming weeks.    Recommendations: 1) Can use PO compazine 10mg  + benadryl 25mg  for headache at home if needed. If not working could use naproxen.  2) Continue gabapentin as this can be useful for headache prevention. Could increase this to 600mg  TID if needed.  3) BP control 4) Can be discharged home from my perspective, no further recommendations. Please call with any further questions or concerns.    Roland Rack, MD [facility] [phone]  If 7pm- 7am, please page neurology on call as listed in [place].  12/12/2014, 8:50 AM

## 2014-12-12 NOTE — Telephone Encounter (Addendum)
-----   Message from Gabriel Earing, [place] sent at 12/12/2014  9:17 AM EDT ----- This pt is s/p carotid endarterectomy and readmitted with headaches.  She will need to f/u with Dr. Donnetta Hutching in 1-2 weeks.  ( I don't see where she has a f/u appt from her surgery).  Thanks   notified patient of post op appt. on 12-24-14 at 1pm with dr. early

## 2014-12-12 NOTE — Progress Notes (Addendum)
[facility] [place]  Subjective  - HA sever yesterday stared on Migraine cocktail per neurology.  Current BP at 0441 135/60.  12/11/2014 195/76 at 1615.     Objective 135/60 65 97.5 F (36.4 C) (Oral) 16 97%  Intake/Output Summary (Last 24 hours) at 12/12/14 0747 Last data filed at 12/11/14 1300  Gross per 24 hour  Intake    480 ml  Output      0 ml  Net    480 ml    Radial artery palpable  Grip 5/5, sensation intact and equal bilaterally Pupils equal, eye motion intact  Heart RRR Lungs non labored breathing No change from yesterday  Assessment/Planning: Sever HA s/p left CEA No HA this am, neurology following with migraine treatment and continued BP control.  Her Neurontin was increased.     Laurence Slate [facility] 12/12/2014 7:47 AM --  Laboratory Lab Results:  Recent Labs  12/10/14 1353  WBC 15.3*  HGB 12.0  HCT 35.7*  PLT 289   BMET  Recent Labs  12/10/14 1353  NA 138  K 4.4  CL 103  CO2 25  GLUCOSE 162*  BUN 20  CREATININE 0.96  CALCIUM 9.5    COAG Lab Results  Component Value Date   INR 0.99 11/28/2014   No results found for: PTT   Appreciate neurology's assistance. More comfortable today. We will allow discharge on headache recommendations from neurology I have examined the patient, reviewed and agree with above.  Curt Jews, MD 12/12/2014 10:23 AM

## 2014-12-15 ENCOUNTER — Observation Stay (HOSPITAL_COMMUNITY)
Admission: EM | Admit: 2014-12-15 | Discharge: 2014-12-17 | Disposition: A | Discharge status: Home / Self Care | Payer: 59 | Attending: Internal Medicine | Admitting: Internal Medicine

## 2014-12-15 ENCOUNTER — Emergency Department (HOSPITAL_COMMUNITY): Payer: 59

## 2014-12-15 ENCOUNTER — Encounter (HOSPITAL_COMMUNITY): Payer: Self-pay

## 2014-12-15 DIAGNOSIS — F17200 Nicotine dependence, unspecified, uncomplicated: Secondary | ICD-10-CM | POA: Diagnosis present

## 2014-12-15 DIAGNOSIS — Z87442 Personal history of urinary calculi: Secondary | ICD-10-CM | POA: Insufficient documentation

## 2014-12-15 DIAGNOSIS — E669 Obesity, unspecified: Secondary | ICD-10-CM | POA: Diagnosis not present

## 2014-12-15 DIAGNOSIS — G451 Carotid artery syndrome (hemispheric): Secondary | ICD-10-CM | POA: Diagnosis not present

## 2014-12-15 DIAGNOSIS — R918 Other nonspecific abnormal finding of lung field: Secondary | ICD-10-CM | POA: Insufficient documentation

## 2014-12-15 DIAGNOSIS — I10 Essential (primary) hypertension: Secondary | ICD-10-CM | POA: Diagnosis not present

## 2014-12-15 DIAGNOSIS — Z7982 Long term (current) use of aspirin: Secondary | ICD-10-CM | POA: Insufficient documentation

## 2014-12-15 DIAGNOSIS — R51 Headache: Secondary | ICD-10-CM | POA: Diagnosis not present

## 2014-12-15 DIAGNOSIS — E7800 Pure hypercholesterolemia, unspecified: Secondary | ICD-10-CM | POA: Diagnosis not present

## 2014-12-15 DIAGNOSIS — E781 Pure hyperglyceridemia: Secondary | ICD-10-CM | POA: Diagnosis not present

## 2014-12-15 DIAGNOSIS — I779 Disorder of arteries and arterioles, unspecified: Secondary | ICD-10-CM | POA: Diagnosis present

## 2014-12-15 DIAGNOSIS — K210 Gastro-esophageal reflux disease with esophagitis: Secondary | ICD-10-CM | POA: Insufficient documentation

## 2014-12-15 DIAGNOSIS — Z8673 Personal history of transient ischemic attack (TIA), and cerebral infarction without residual deficits: Secondary | ICD-10-CM | POA: Diagnosis not present

## 2014-12-15 DIAGNOSIS — G459 Transient cerebral ischemic attack, unspecified: Secondary | ICD-10-CM | POA: Diagnosis present

## 2014-12-15 DIAGNOSIS — Z91040 Latex allergy status: Secondary | ICD-10-CM | POA: Diagnosis not present

## 2014-12-15 DIAGNOSIS — Z885 Allergy status to narcotic agent status: Secondary | ICD-10-CM | POA: Insufficient documentation

## 2014-12-15 DIAGNOSIS — G44001 Cluster headache syndrome, unspecified, intractable: Secondary | ICD-10-CM | POA: Diagnosis not present

## 2014-12-15 DIAGNOSIS — D72829 Elevated white blood cell count, unspecified: Secondary | ICD-10-CM | POA: Insufficient documentation

## 2014-12-15 DIAGNOSIS — E119 Type 2 diabetes mellitus without complications: Secondary | ICD-10-CM | POA: Insufficient documentation

## 2014-12-15 DIAGNOSIS — Z882 Allergy status to sulfonamides status: Secondary | ICD-10-CM | POA: Insufficient documentation

## 2014-12-15 DIAGNOSIS — R519 Headache, unspecified: Secondary | ICD-10-CM | POA: Diagnosis present

## 2014-12-15 DIAGNOSIS — E1122 Type 2 diabetes mellitus with diabetic chronic kidney disease: Secondary | ICD-10-CM

## 2014-12-15 DIAGNOSIS — I739 Peripheral vascular disease, unspecified: Secondary | ICD-10-CM

## 2014-12-15 DIAGNOSIS — Z6826 Body mass index (BMI) 26.0-26.9, adult: Secondary | ICD-10-CM | POA: Diagnosis not present

## 2014-12-15 LAB — GLUCOSE, CAPILLARY: Glucose-Capillary: 237 mg/dL — ABNORMAL HIGH (ref 65–99)

## 2014-12-15 LAB — CBC
HEMATOCRIT: 31.3 % — AB (ref 36.0–46.0)
Hemoglobin: 10.2 g/dL — ABNORMAL LOW (ref 12.0–15.0)
MCH: 29.7 pg (ref 26.0–34.0)
MCHC: 32.6 g/dL (ref 30.0–36.0)
MCV: 91 fL (ref 78.0–100.0)
Platelets: 298 10*3/uL (ref 150–400)
RBC: 3.44 MIL/uL — ABNORMAL LOW (ref 3.87–5.11)
RDW: 13.8 % (ref 11.5–15.5)
WBC: 13.8 10*3/uL — AB (ref 4.0–10.5)

## 2014-12-15 LAB — CBC WITH DIFFERENTIAL/PLATELET
BASOS ABS: 0 10*3/uL (ref 0.0–0.1)
BASOS PCT: 0 %
EOS ABS: 0.5 10*3/uL (ref 0.0–0.7)
Eosinophils Relative: 3 %
HCT: 35.4 % — ABNORMAL LOW (ref 36.0–46.0)
Hemoglobin: 11.7 g/dL — ABNORMAL LOW (ref 12.0–15.0)
LYMPHS PCT: 22 %
Lymphs Abs: 3.9 10*3/uL (ref 0.7–4.0)
MCH: 30.3 pg (ref 26.0–34.0)
MCHC: 33.1 g/dL (ref 30.0–36.0)
MCV: 91.7 fL (ref 78.0–100.0)
MONO ABS: 1.1 10*3/uL — AB (ref 0.1–1.0)
Monocytes Relative: 6 %
NEUTROS PCT: 69 %
Neutro Abs: 12 10*3/uL — ABNORMAL HIGH (ref 1.7–7.7)
PLATELETS: 315 10*3/uL (ref 150–400)
RBC: 3.86 MIL/uL — ABNORMAL LOW (ref 3.87–5.11)
RDW: 13.7 % (ref 11.5–15.5)
WBC: 17.5 10*3/uL — ABNORMAL HIGH (ref 4.0–10.5)

## 2014-12-15 LAB — COMPREHENSIVE METABOLIC PANEL
ALT: 40 U/L (ref 14–54)
AST: 34 U/L (ref 15–41)
Albumin: 4 g/dL (ref 3.5–5.0)
Alkaline Phosphatase: 55 U/L (ref 38–126)
Anion gap: 10 (ref 5–15)
BUN: 33 mg/dL — ABNORMAL HIGH (ref 6–20)
CHLORIDE: 105 mmol/L (ref 101–111)
CO2: 24 mmol/L (ref 22–32)
Calcium: 9.2 mg/dL (ref 8.9–10.3)
Creatinine, Ser: 1.12 mg/dL — ABNORMAL HIGH (ref 0.44–1.00)
GFR, EST NON AFRICAN AMERICAN: 53 mL/min — AB (ref >60)
Glucose, Bld: 119 mg/dL — ABNORMAL HIGH (ref 65–99)
POTASSIUM: 5 mmol/L (ref 3.5–5.1)
SODIUM: 139 mmol/L (ref 135–145)
Total Bilirubin: 0.4 mg/dL (ref 0.3–1.2)
Total Protein: 7.2 g/dL (ref 6.5–8.1)

## 2014-12-15 LAB — CREATININE, SERUM
Creatinine, Ser: 1.11 mg/dL — ABNORMAL HIGH (ref 0.44–1.00)
GFR calc non Af Amer: 53 mL/min — ABNORMAL LOW (ref >60)

## 2014-12-15 MED ORDER — ACETAMINOPHEN 325 MG PO TABS
650.0000 mg | ORAL_TABLET | Freq: Four times a day (QID) | ORAL | Status: DC | PRN
  Administered 2014-12-16 (×3): 650 mg via ORAL
  Filled 2014-12-15 (×2): qty 2

## 2014-12-15 MED ORDER — SODIUM CHLORIDE 0.9 % IV SOLN: 250.0000 mL | INTRAVENOUS | Status: DC | PRN

## 2014-12-15 MED ORDER — SODIUM CHLORIDE 0.9 % IJ SOLN
3.0000 mL | Freq: Two times a day (BID) | INTRAMUSCULAR | Status: DC
  Administered 2014-12-15 – 2014-12-16 (×3): 3 mL via INTRAVENOUS

## 2014-12-15 MED ORDER — PANTOPRAZOLE SODIUM 40 MG PO TBEC
40.0000 mg | DELAYED_RELEASE_TABLET | Freq: Every day | ORAL | Status: DC
  Administered 2014-12-16 – 2014-12-17 (×2): 40 mg via ORAL
  Filled 2014-12-15 (×2): qty 1

## 2014-12-15 MED ORDER — ALPRAZOLAM 0.5 MG PO TABS
0.5000 mg | ORAL_TABLET | Freq: Two times a day (BID) | ORAL | Status: DC
  Administered 2014-12-16 – 2014-12-17 (×3): 0.5 mg via ORAL
  Filled 2014-12-15 (×3): qty 1

## 2014-12-15 MED ORDER — FENTANYL CITRATE (PF) 100 MCG/2ML IJ SOLN
25.0000 ug | Freq: Once | INTRAMUSCULAR | Status: AC
  Administered 2014-12-15: 25 ug via INTRAVENOUS
  Filled 2014-12-15: qty 2

## 2014-12-15 MED ORDER — ASPIRIN EC 81 MG PO TBEC
81.0000 mg | DELAYED_RELEASE_TABLET | Freq: Every day | ORAL | Status: DC
  Administered 2014-12-15 – 2014-12-17 (×3): 81 mg via ORAL
  Filled 2014-12-15 (×3): qty 1

## 2014-12-15 MED ORDER — FENOFIBRATE 54 MG PO TABS: 54.0000 mg | ORAL_TABLET | Freq: Every day | ORAL | Status: DC

## 2014-12-15 MED ORDER — CLOPIDOGREL BISULFATE 75 MG PO TABS
75.0000 mg | ORAL_TABLET | Freq: Every day | ORAL | Status: DC
  Administered 2014-12-16 – 2014-12-17 (×2): 75 mg via ORAL
  Filled 2014-12-15 (×2): qty 1

## 2014-12-15 MED ORDER — DOCUSATE SODIUM 100 MG PO CAPS
100.0000 mg | ORAL_CAPSULE | Freq: Two times a day (BID) | ORAL | Status: DC
  Administered 2014-12-16 – 2014-12-17 (×3): 100 mg via ORAL
  Filled 2014-12-15 (×4): qty 1

## 2014-12-15 MED ORDER — IOHEXOL 350 MG/ML SOLN
75.0000 mL | Freq: Once | INTRAVENOUS | Status: AC | PRN
  Administered 2014-12-15: 100 mL via INTRAVENOUS

## 2014-12-15 MED ORDER — HEPARIN SODIUM (PORCINE) 5000 UNIT/ML IJ SOLN
5000.0000 [IU] | Freq: Three times a day (TID) | INTRAMUSCULAR | Status: DC
  Administered 2014-12-15 – 2014-12-16 (×4): 5000 [IU] via SUBCUTANEOUS
  Filled 2014-12-15 (×4): qty 1

## 2014-12-15 MED ORDER — ONDANSETRON HCL 4 MG/2ML IJ SOLN
4.0000 mg | Freq: Four times a day (QID) | INTRAMUSCULAR | Status: DC | PRN
  Administered 2014-12-16: 4 mg via INTRAVENOUS
  Filled 2014-12-15 (×2): qty 2

## 2014-12-15 MED ORDER — SODIUM CHLORIDE 0.9 % IJ SOLN: 3.0000 mL | INTRAMUSCULAR | Status: DC | PRN

## 2014-12-15 MED ORDER — LABETALOL HCL 5 MG/ML IV SOLN
INTRAVENOUS | Status: AC
  Filled 2014-12-15: qty 4

## 2014-12-15 MED ORDER — ONDANSETRON HCL 4 MG PO TABS
4.0000 mg | ORAL_TABLET | Freq: Four times a day (QID) | ORAL | Status: DC | PRN
Start: 2014-12-15 — End: 2014-12-17

## 2014-12-15 MED ORDER — LABETALOL HCL 5 MG/ML IV SOLN
20.0000 mg | Freq: Once | INTRAVENOUS | Status: AC
  Administered 2014-12-15: 20 mg via INTRAVENOUS

## 2014-12-15 MED ORDER — ATORVASTATIN CALCIUM 80 MG PO TABS
80.0000 mg | ORAL_TABLET | Freq: Every day | ORAL | Status: DC
  Administered 2014-12-16: 80 mg via ORAL
  Filled 2014-12-15: qty 1

## 2014-12-15 MED ORDER — LISINOPRIL 5 MG PO TABS
5.0000 mg | ORAL_TABLET | Freq: Every day | ORAL | Status: DC
  Administered 2014-12-16: 5 mg via ORAL
  Filled 2014-12-15: qty 1

## 2014-12-15 MED ORDER — GABAPENTIN 300 MG PO CAPS
300.0000 mg | ORAL_CAPSULE | Freq: Three times a day (TID) | ORAL | Status: DC
  Administered 2014-12-16 – 2014-12-17 (×4): 300 mg via ORAL
  Filled 2014-12-15 (×4): qty 1

## 2014-12-15 MED ORDER — FENTANYL CITRATE (PF) 100 MCG/2ML IJ SOLN
25.0000 ug | INTRAMUSCULAR | Status: DC | PRN
Start: 2014-12-15 — End: 2014-12-17
  Administered 2014-12-16 (×2): 25 ug via INTRAVENOUS
  Filled 2014-12-15 (×3): qty 2

## 2014-12-15 NOTE — ED Notes (Signed)
SOC called back to Dr. Roderic Palau at 12:59

## 2014-12-15 NOTE — ED Notes (Signed)
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  Reprots  around 1130 am she was putting items away in the Sunday school class and she noticed weakness in r side.  Husband said pt's speech was different and ems noticed slight r sided facial droop.  Reports blurred vision briefly.  EMS also noticed pt was slow to answer questions.  CBG 113.  Pt had left carotid artery "cleaned out" 2 weeks ago and was told her R carotid artery was 70% blocked.  EMS reports left sided headache and bp 220/110 initially.  Last bp was 210/95.  Pt's initial room air 02 sat was 93%.  EMS put pt on 15liters oxygen and sat 100%.

## 2014-12-15 NOTE — ED Notes (Signed)
Pt back from CT.  Dr. Roderic Palau at bedside.

## 2014-12-15 NOTE — ED Notes (Signed)
Paged "Code Stroke" at 12:44

## 2014-12-15 NOTE — ED Provider Notes (Addendum)
CSN: [id number]     Arrival date & time 12/15/14  1244 History   First MD Initiated Contact with Patient 12/15/14 1248     Chief Complaint  Patient presents with  . Code Stroke    An emergency department physician performed an initial assessment on this suspected stroke patient at 58. (Consider location/radiation/quality/duration/timing/severity/associated sxs/prior Treatment) Patient is a 59 y.o. female presenting with weakness. The history is provided by the patient (The patient states that she became weak on the right side including her right arm and right leg and had slurred speech around 11:30 today. She improved in about 45 minutes.).  Weakness This is a new problem. The current episode started less than 1 hour ago. The problem occurs rarely. The problem has been resolved. Pertinent negatives include no chest pain, no abdominal pain and no headaches. Nothing aggravates the symptoms. Nothing relieves the symptoms.    Past Medical History  Diagnosis Date  . Hypercholesteremia   . Obesity   . Chest pain, unspecified   . Reflux esophagitis   . Cervicalgia   . Hypertension   . Heart murmur   . Type II diabetes mellitus ([place])   . History of hiatal hernia   . GERD (gastroesophageal reflux disease)   . Hepatitis A     "related to [employer]"  . Stroke [facility])     "they said I had a silent stroke a few months back/MRI" (12/10/2014)  . Kidney stones     "no cysto/OR" (12/10/2014)  . TIA (transient ischemic attack) 11/28/2014    [name] 11/28/2014   Past Surgical History  Procedure Laterality Date  . Endarterectomy Left 12/02/2014    Procedure: ENDARTERECTOMY CAROTID;  Surgeon: Rosetta Posner, MD;  Location: [place];  Service: Vascular;  Laterality: Left;  . Cholecystectomy open  1978  . Vaginal hysterectomy  1991    "partial"   Family History  Problem Relation Age of Onset  . COPD Father   . Congestive Heart Failure Mother   . Cancer Brother    Social History  Substance Use Topics   . Smoking status: Current Every Day Smoker -- 1.25 packs/day for 41 years    Types: Cigarettes  . Smokeless tobacco: Never Used  . Alcohol Use: No   OB History    No data available     Review of Systems  Constitutional: Negative for appetite change and fatigue.  HENT: Negative for congestion, ear discharge and sinus pressure.        Slurred speech  Eyes: Negative for discharge.  Respiratory: Negative for cough.   Cardiovascular: Negative for chest pain.  Gastrointestinal: Negative for abdominal pain and diarrhea.  Genitourinary: Negative for frequency and hematuria.  Musculoskeletal: Negative for back pain.  Skin: Negative for rash.  Neurological: Positive for weakness. Negative for seizures and headaches.  Psychiatric/Behavioral: Negative for hallucinations.      Allergies  Codeine; Latex; and Sulfa antibiotics  Home Medications   Prior to Admission medications   Medication Sig Start Date End Date Taking? Authorizing Provider  ALPRAZolam Duanne Moron) 1 MG tablet Take 0.5 mg by mouth 2 (two) times daily.    Yes Historical Provider, MD  aspirin EC 81 MG tablet Take 1 tablet (81 mg total) by mouth daily. Please continue Aspirin and plavix for 6 months, then plavix daily 12/03/14  Yes Ripudeep K Rai, MD  atorvastatin (LIPITOR) 80 MG tablet Take 1 tablet (80 mg total) by mouth at bedtime. 12/03/14  Yes Ripudeep Krystal Eaton, MD  clopidogrel (PLAVIX) 75 MG tablet Take 1 tablet (75 mg total) by mouth daily. 12/03/14  Yes Ripudeep Krystal Eaton, MD  diphenhydrAMINE (BENADRYL) 25 MG tablet Take 0.5 tablets (12.5 mg total) by mouth every 6 (six) hours as needed. 12/12/14  Yes Samantha J Rhyne, PA-C  fenofibrate 54 MG tablet Take 1 tablet (54 mg total) by mouth daily. 12/03/14  Yes Ripudeep Krystal Eaton, MD  gabapentin (NEURONTIN) 300 MG capsule Take 300 mg by mouth 3 (three) times daily.   Yes Historical Provider, MD  ketorolac (TORADOL) 10 MG tablet Take 1 tablet (10 mg total) by mouth every 6 (six) hours as  needed. 12/12/14  Yes Samantha J Rhyne, PA-C  lisinopril (PRINIVIL,ZESTRIL) 5 MG tablet Take 5 mg by mouth daily.   Yes Historical Provider, MD  metFORMIN (GLUCOPHAGE) 1000 MG tablet Take 1 tablet (1,000 mg total) by mouth 2 (two) times daily with a meal. 12/03/14  Yes Ripudeep K Rai, MD  oxyCODONE (OXY IR/ROXICODONE) 5 MG immediate release tablet Take 1 tablet (5 mg total) by mouth every 4 (four) hours as needed for moderate pain. 12/03/14  Yes Ulyses Amor, PA-C  pantoprazole (PROTONIX) 40 MG tablet Take 1 tablet (40 mg total) by mouth daily. 12/03/14  Yes Ripudeep Krystal Eaton, MD  traMADol (ULTRAM) 50 MG tablet Take 1 tablet (50 mg total) by mouth every 6 (six) hours as needed for moderate pain. 12/03/14  Yes Ulyses Amor, PA-C  lisinopril (PRINIVIL,ZESTRIL) 20 MG tablet Take 0.5 tablets (10 mg total) by mouth daily. Patient not taking: Reported on 12/15/2014 12/03/14   Ripudeep K Rai, MD   BP 146/78 mmHg  Pulse 70  Temp(Src) 98.1 F (36.7 C) (Oral)  Resp 14  Ht 5\' 6"  (1.676 m)  SpO2 92% Physical Exam  Constitutional: She is oriented to person, place, and time. She appears well-developed.  HENT:  Head: Normocephalic.  Eyes: Conjunctivae and EOM are normal. No scleral icterus.  Neck: Neck supple. No thyromegaly present.  Cardiovascular: Normal rate and regular rhythm.  Exam reveals no gallop and no friction rub.   No murmur heard. Pulmonary/Chest: No stridor. She has no wheezes. She has no rales. She exhibits no tenderness.  Abdominal: She exhibits no distension. There is no tenderness. There is no rebound.  Musculoskeletal: Normal range of motion. She exhibits no edema or tenderness.  Lymphadenopathy:    She has no cervical adenopathy.  Neurological: She is alert and oriented to person, place, and time. She displays normal reflexes. No cranial nerve deficit. She exhibits normal muscle tone. Coordination normal.  Normal strength  Skin: No rash noted. No erythema.  Psychiatric: She has a  normal mood and affect. Her behavior is normal.    ED Course  Procedures (including critical care time) Labs Review Labs Reviewed  CBC WITH DIFFERENTIAL/PLATELET - Abnormal; Notable for the following:    WBC 17.5 (*)    RBC 3.86 (*)    Hemoglobin 11.7 (*)    HCT 35.4 (*)    Neutro Abs 12.0 (*)    Monocytes Absolute 1.1 (*)    All other components within normal limits  COMPREHENSIVE METABOLIC PANEL - Abnormal; Notable for the following:    Glucose, Bld 119 (*)    BUN 33 (*)    Creatinine, Ser 1.12 (*)    GFR calc non Af Amer 53 (*)    All other components within normal limits  I-STAT CHEM 8, ED    Imaging Review Ct Head Wo Contrast  12/15/2014   CLINICAL DATA:  Right-sided weakness with facial droop and dysphasia  EXAM: CT HEAD WITHOUT CONTRAST  TECHNIQUE: Contiguous axial images were obtained from the base of the skull through the vertex without intravenous contrast.  COMPARISON:  December 10, 2014  FINDINGS: The ventricles are normal in size and configuration. There is mild superior frontal atrophy bilaterally, stable. There is local prevascular prominence in the medial left temporal lobe, stable. This area may represent a tiny arachnoid cyst. There is no other evidence suggesting potential mass. No hemorrhage, extra-axial fluid collection, or midline shift. There is mild small vessel disease in the centra semiovale bilaterally. No acute infarct evident. Both middle cerebral artery show normal attenuation. The bony calvarium appears intact. The mastoid air cells are clear.  IMPRESSION: No evidence acute infarct. Slight periventricular small vessel disease. Probable prominent perivascular space in the medial left temporal lobe. This finding is stable and benign in appearance. It may represent a small arachnoid cyst. No mass effect or hemorrhage.  Critical Value/emergent results were called by telephone at the time of interpretation on 12/15/2014 at 1:05 pm to Dr. Milton Ferguson , who  verbally acknowledged these results.   Electronically Signed   By: Lowella Grip III M.D.   On: 12/15/2014 13:05   I have personally reviewed and evaluated these images and lab results as part of my medical decision-making.   EKG Interpretation   Date/Time:  Sunday December 15 2014 13:02:08 EDT Ventricular Rate:  81 PR Interval:  123 QRS Duration: 87 QT Interval:  350 QTC Calculation: 406 R Axis:   71 Text Interpretation:  Sinus rhythm Atrial premature complex Low voltage,  precordial leads RSR' in V1 or V2, probably normal variant Confirmed by  Knox Holdman  MD, Broadus John [phone]) on 12/15/2014 3:57:07 PM     CRITICAL CARE Performed by: Dallyn Bergland L Total critical care time: 45 Critical care time was exclusive of separately billable procedures and treating other patients. Critical care was necessary to treat or prevent imminent or life-threatening deterioration. Critical care was time spent personally by me on the following activities: development of treatment plan with patient and/or surrogate as well as nursing, discussions with consultants, evaluation of patient's response to treatment, examination of patient, obtaining history from patient or surrogate, ordering and performing treatments and interventions, ordering and review of laboratory studies, ordering and review of radiographic studies, pulse oximetry and re-evaluation of patient's condition.  MDM   Final diagnoses:  None    Patient was seen by the telemetry neurologist. He stated that the patient was very much back to normal when he saw the patient. The neurologist did not recommend any TPA. He suggested getting a CT angiogram of the head and neck and an MRI which could be done on a nonemergent basis. The CTA of her head and neck did not show any changes since her surgery 2 weeks ago. Radiologist also stated that the endarterectomy fine. Patient will be admitted to medicine. [facility] is deciding whether to admit the patient today  pen or color.    Milton Ferguson, MD 12/15/14 1627  Milton Ferguson, MD 12/15/14 [phone]

## 2014-12-15 NOTE — Consult Note (Signed)
Stroke Consult    Chief Complaint: right sided weakness, aphasia, paresthesias  HPI: Grace Hess is an 59 y.o. female hx of HTN, DM, TIA, CEA on Sept 19, 2016, recent admission for frontal HA felt to be reperfusion headache. Presents to the ED at [facility] with 15 minutes of right sided weakness, slurred speech and paresthesias on the right side. Evaluated by teleneurologist, ordered CTA of her neck which was stable. No intervention given. Was asymptomatic in the ED.   Headache has improved with addition of gabapentin. CT head imaging reviewed, CT shows no acute infarct. CTA shows 30-40% stenosis of L ICA, 46% stenosis proximal right ICA.   Date last known well: 10/02 Time last known well: 1100 tPA Given: no, symptoms resolved upon arrival to ED Modified Rankin: Rankin Score=0  Past Medical History  Diagnosis Date  . Hypercholesteremia   . Obesity   . Chest pain, unspecified   . Reflux esophagitis   . Cervicalgia   . Hypertension   . Heart murmur   . Type II diabetes mellitus ([place])   . History of hiatal hernia   . GERD (gastroesophageal reflux disease)   . Hepatitis A     "related to [employer]"  . Stroke [facility])     "they said I had a silent stroke a few months back/MRI" (12/10/2014)  . Kidney stones     "no cysto/OR" (12/10/2014)  . TIA (transient ischemic attack) 11/28/2014    [name] 11/28/2014    Past Surgical History  Procedure Laterality Date  . Endarterectomy Left 12/02/2014    Procedure: ENDARTERECTOMY CAROTID;  Surgeon: Rosetta Posner, MD;  Location: [place];  Service: Vascular;  Laterality: Left;  . Cholecystectomy open  1978  . Vaginal hysterectomy  1991    "partial"    Family History  Problem Relation Age of Onset  . COPD Father   . Congestive Heart Failure Mother   . Cancer Brother    Social History:  reports that she has been smoking Cigarettes.  She has a 51.25 pack-year smoking history. She has never used smokeless tobacco. She reports that she does not  drink alcohol or use illicit drugs.  Allergies:  Allergies  Allergen Reactions  . Codeine Shortness Of Breath  . Latex Itching  . Sulfa Antibiotics     Childhood allergy    Medications Prior to Admission  Medication Sig Dispense Refill  . ALPRAZolam (XANAX) 1 MG tablet Take 0.5 mg by mouth 2 (two) times daily.     Marland Kitchen aspirin EC 81 MG tablet Take 1 tablet (81 mg total) by mouth daily. Please continue Aspirin and plavix for 6 months, then plavix daily 30 tablet 6  . atorvastatin (LIPITOR) 80 MG tablet Take 1 tablet (80 mg total) by mouth at bedtime. 30 tablet 3  . clopidogrel (PLAVIX) 75 MG tablet Take 1 tablet (75 mg total) by mouth daily. 30 tablet 3  . diphenhydrAMINE (BENADRYL) 25 MG tablet Take 0.5 tablets (12.5 mg total) by mouth every 6 (six) hours as needed. 20 tablet 0  . fenofibrate 54 MG tablet Take 1 tablet (54 mg total) by mouth daily. 30 tablet 2  . gabapentin (NEURONTIN) 300 MG capsule Take 300 mg by mouth 3 (three) times daily.    Marland Kitchen ketorolac (TORADOL) 10 MG tablet Take 1 tablet (10 mg total) by mouth every 6 (six) hours as needed. 20 tablet 0  . lisinopril (PRINIVIL,ZESTRIL) 5 MG tablet Take 5 mg by mouth daily.    Marland Kitchen  metFORMIN (GLUCOPHAGE) 1000 MG tablet Take 1 tablet (1,000 mg total) by mouth 2 (two) times daily with a meal. 60 tablet 3  . oxyCODONE (OXY IR/ROXICODONE) 5 MG immediate release tablet Take 1 tablet (5 mg total) by mouth every 4 (four) hours as needed for moderate pain. 30 tablet 0  . pantoprazole (PROTONIX) 40 MG tablet Take 1 tablet (40 mg total) by mouth daily. 30 tablet 4  . traMADol (ULTRAM) 50 MG tablet Take 1 tablet (50 mg total) by mouth every 6 (six) hours as needed for moderate pain. 30 tablet 0  . lisinopril (PRINIVIL,ZESTRIL) 20 MG tablet Take 0.5 tablets (10 mg total) by mouth daily. (Patient not taking: Reported on 12/15/2014)      ROS: Out of a complete 14 system review, the patient complains of only the following symptoms, and all other  reviewed systems are negative. +weakness  Physical Examination: Filed Vitals:   12/15/14 1819  BP: 158/99  Pulse: 70  Temp: 98.1 F (36.7 C)  Resp: 11   Physical Exam  Constitutional: He appears well-developed and well-nourished.  Psych: Affect appropriate to situation Eyes: No scleral injection HENT: No OP obstrucion Head: Normocephalic.  Cardiovascular: Normal rate and regular rhythm.  Respiratory: Effort normal and breath sounds normal.  GI: Soft. Bowel sounds are normal. No distension. There is no tenderness.  Skin: WDI  Neurologic Examination: Mental Status: Alert, oriented, thought content appropriate.  Speech fluent without evidence of aphasia. No dysarthria. Able to follow 3 step commands without difficulty. Cranial Nerves: II: funduscopic exam wnl bilaterally, visual fields grossly normal, pupils equal, round, reactive to light and accommodation III,IV, VI: ptosis not present, extra-ocular motions intact bilaterally V,VII: smile symmetric, facial light touch sensation normal bilaterally VIII: hearing normal bilaterally IX,X: gag reflex present XI: trapezius strength/neck flexion strength normal bilaterally XII: tongue strength normal  Motor: Right : Upper extremity    Left:     Upper extremity 5/5 deltoid       5/5 deltoid 5/5 biceps      5/5 biceps  5/5 triceps      5/5 triceps 5/5 hand grip      5/5 hand grip  Lower extremity     Lower extremity 5/5 hip flexor      5/5 hip flexor 5/5 quadricep      5/5 quadriceps  5/5 hamstrings     5/5 hamstrings 5/5 plantar flexion       5/5 plantar flexion 5/5 plantar extension     5/5 plantar extension Tone and bulk:normal tone throughout; no atrophy noted Sensory: Pinprick and light touch intact throughout, bilaterally Deep Tendon Reflexes: 2+ and symmetric throughout Plantars: Right: downgoing   Left: downgoing Cerebellar: normal finger-to-nose, normal rapid alternating movements and normal heel-to-shin  test Gait: normal gait and station  Laboratory Studies:   Basic Metabolic Panel:  Recent Labs Lab 12/10/14 1353 12/15/14 1310  NA 138 139  K 4.4 5.0  CL 103 105  CO2 25 24  GLUCOSE 162* 119*  BUN 20 33*  CREATININE 0.96 1.12*  CALCIUM 9.5 9.2    Liver Function Tests:  Recent Labs Lab 12/15/14 1310  AST 34  ALT 40  ALKPHOS 55  BILITOT 0.4  PROT 7.2  ALBUMIN 4.0   No results for input(s): LIPASE, AMYLASE in the last 168 hours. No results for input(s): AMMONIA in the last 168 hours.  CBC:  Recent Labs Lab 12/10/14 1353 12/15/14 1310  WBC 15.3* 17.5*  NEUTROABS  --  12.0*  HGB 12.0 11.7*  HCT 35.7* 35.4*  MCV 90.8 91.7  PLT 289 315    Cardiac Enzymes: No results for input(s): CKTOTAL, CKMB, CKMBINDEX, TROPONINI in the last 168 hours.  BNP: Invalid input(s): POCBNP  CBG:  Recent Labs Lab 12/11/14 0600 12/11/14 1119 12/11/14 2130 12/12/14 0618 12/12/14 1118  GLUCAP 218* 256* 173* 49* 174*    Microbiology: Results for orders placed or performed during the hospital encounter of 11/28/14  Surgical pcr screen     Status: Abnormal   Collection Time: 12/01/14  9:19 PM  Result Value Ref Range Status   MRSA, PCR NEGATIVE NEGATIVE Final   Staphylococcus aureus POSITIVE (A) NEGATIVE Final    Comment:        The Xpert SA Assay (FDA approved for NASAL specimens in patients over 52 years of age), is one component of a comprehensive surveillance program.  Test performance has been validated by [facility] for patients greater than or equal to 86 year old. It is not intended to diagnose infection nor to guide or monitor treatment.     Coagulation Studies: No results for input(s): LABPROT, INR in the last 72 hours.  Urinalysis: No results for input(s): COLORURINE, LABSPEC, PHURINE, GLUCOSEU, HGBUR, BILIRUBINUR, KETONESUR, PROTEINUR, UROBILINOGEN, NITRITE, LEUKOCYTESUR in the last 168 hours.  Invalid input(s): APPERANCEUR  Lipid Panel:      Component Value Date/Time   CHOL 303* 11/29/2014 0630   TRIG 967* 11/29/2014 0630   HDL 30* 11/29/2014 0630   CHOLHDL 10.1 11/29/2014 0630   VLDL UNABLE TO CALCULATE IF TRIGLYCERIDE OVER 400 mg/dL 11/29/2014 0630   LDLCALC UNABLE TO CALCULATE IF TRIGLYCERIDE OVER 400 mg/dL 11/29/2014 0630    HgbA1C:  Lab Results  Component Value Date   HGBA1C 8.6* 11/28/2014    Urine Drug Screen:     Component Value Date/Time   LABOPIA NONE DETECTED 11/28/2014 1300   COCAINSCRNUR NONE DETECTED 11/28/2014 1300   LABBENZ POSITIVE* 11/28/2014 1300   AMPHETMU NONE DETECTED 11/28/2014 1300   THCU NONE DETECTED 11/28/2014 1300   LABBARB NONE DETECTED 11/28/2014 1300    Alcohol Level: No results for input(s): ETH in the last 168 hours.  Other results:  Imaging: Ct Angio Head W/cm &/or Wo Cm  12/15/2014   CLINICAL DATA:  59 year old diabetic hypertensive female with right facial numbness and droop. Post left endarterectomy 12/02/2014. Subsequent encounter.  EXAM: CT ANGIOGRAPHY HEAD AND NECK  TECHNIQUE: Multidetector CT imaging of the head and neck was performed using the standard protocol during bolus administration of intravenous contrast. Multiplanar CT image reconstructions and MIPs were obtained to evaluate the vascular anatomy. Carotid stenosis measurements (when applicable) are obtained utilizing NASCET criteria, using the distal internal carotid diameter as the denominator.  CONTRAST:  155mL OMNIPAQUE IOHEXOL 350 MG/ML SOLN  COMPARISON:  12/15/2014, 12/10/2014 and 11/28/2014 head CT. 12/01/2014 CT angiogram of the neck. 11/28/2014 brain MR.  FINDINGS: CT HEAD  Brain: No intracranial hemorrhage.  Subtle hypodensities within the left frontal lobe appear relatively similar to the preoperative 11/28/2014 exam without CT evidence of large acute infarct. MR would prove helpful to evaluate for small or medium size infarct.  No intracranial enhancing lesion.  No hydrocephalus.  Perivascular space left basal  ganglia incidentally noted.  Calvarium and skull base: Negative.  Paranasal sinuses: Clear.  Orbits: Negative.  CTA NECK  Aortic arch: 3 vessel aortic arch with atherosclerotic type changes. 30-40% diameter stenosis proximal left common carotid artery. 30% diameter stenosis proximal left subclavian artery.  Right  carotid system: Patent right common carotid artery with mild plaque. Plaque right carotid bifurcation with mild narrowing distal right common carotid artery and 46% diameter stenosis proximal right internal carotid artery.  Left carotid system: 30-40% diameter stenosis proximal left common carotid artery. Soft plaque with 30% diameter narrowing mid to distal left common carotid artery. Post left carotid endarterectomy without complication noted. Mild fold at the distal clamp site with with mild narrowing.  Vertebral arteries:Mild narrowing proximal left vertebral artery. No significant narrowing right vertebral artery.  Skeleton: Mild cervical spondylotic changes.  Other neck: No worrisome primary neck mass.  Lung apex: 4 mm noncalcified nodule right lung apex. If the patient is at high risk for bronchogenic carcinoma, follow-up chest CT at 1 year is recommended. If the patient is at low risk, no follow-up is needed. This recommendation follows the consensus statement: Guidelines for Management of Small Pulmonary Nodules Detected on CT Scans: A Statement from the [place] as published in Radiology 2005; 237:395-400.  CTA HEAD  Anterior circulation: Plaque internal carotid artery cavernous segment with mild to slightly moderate narrowing on the left and mild narrowing on the right. Otherwise anterior circulation without medium or large size vessel significant stenosis or occlusion.  Posterior circulation: Codominant vertebral arteries without significant narrowing of the distal vertebral arteries, basilar artery or major branches.  Venous sinuses: Neck  Anatomic variants: Negative  Delayed phase:  Negative  IMPRESSION: CT HEAD  Subtle hypodensities within the left frontal lobe appear relatively similar to the preoperative 11/28/2014 exam without CT evidence of large acute infarct. MR would prove helpful to evaluate for small or medium size infarct.  Perivascular space left basal ganglia incidentally noted.  CTA NECK  Post left carotid endarterectomy without complication noted. Mild fold at the distal clamp site with with mild narrowing. 30-40% diameter stenosis proximal left common carotid artery. Soft plaque with 30% diameter narrowing mid to distal left common carotid artery.  Plaque right carotid bifurcation with mild narrowing distal right common carotid artery and 46% diameter stenosis proximal right internal carotid artery.  Mild narrowing proximal left vertebral artery. No significant narrowing right vertebral artery.  CTA HEAD  Plaque internal carotid artery cavernous segment with mild to slightly moderate narrowing on the left and mild narrowing on the right. Otherwise anterior circulation without medium or large size vessel significant stenosis or occlusion.  Codominant vertebral arteries without significant narrowing of the distal vertebral arteries, basilar artery or major branches.  4 mm noncalcified nodule right lung apex. If the patient is at high risk for bronchogenic carcinoma, follow-up chest CT at 1 year is recommended. If the patient is at low risk, no follow-up is needed. This recommendation follows the consensus statement: Guidelines for Management of Small Pulmonary Nodules Detected on CT Scans: A Statement from the [place] as published in Radiology 2005; 237:395-400.   Electronically Signed   By: Genia Del M.D.   On: 12/15/2014 16:29   Ct Head Wo Contrast  12/15/2014   CLINICAL DATA:  Right-sided weakness with facial droop and dysphasia  EXAM: CT HEAD WITHOUT CONTRAST  TECHNIQUE: Contiguous axial images were obtained from the base of the skull through the vertex  without intravenous contrast.  COMPARISON:  December 10, 2014  FINDINGS: The ventricles are normal in size and configuration. There is mild superior frontal atrophy bilaterally, stable. There is local prevascular prominence in the medial left temporal lobe, stable. This area may represent a tiny arachnoid cyst. There is no other evidence suggesting  potential mass. No hemorrhage, extra-axial fluid collection, or midline shift. There is mild small vessel disease in the centra semiovale bilaterally. No acute infarct evident. Both middle cerebral artery show normal attenuation. The bony calvarium appears intact. The mastoid air cells are clear.  IMPRESSION: No evidence acute infarct. Slight periventricular small vessel disease. Probable prominent perivascular space in the medial left temporal lobe. This finding is stable and benign in appearance. It may represent a small arachnoid cyst. No mass effect or hemorrhage.  Critical Value/emergent results were called by telephone at the time of interpretation on 12/15/2014 at 1:05 pm to Dr. Milton Ferguson , who verbally acknowledged these results.   Electronically Signed   By: Lowella Grip III M.D.   On: 12/15/2014 13:05   Ct Angio Neck W/cm &/or Wo/cm  12/15/2014   CLINICAL DATA:  59 year old diabetic hypertensive female with right facial numbness and droop. Post left endarterectomy 12/02/2014. Subsequent encounter.  EXAM: CT ANGIOGRAPHY HEAD AND NECK  TECHNIQUE: Multidetector CT imaging of the head and neck was performed using the standard protocol during bolus administration of intravenous contrast. Multiplanar CT image reconstructions and MIPs were obtained to evaluate the vascular anatomy. Carotid stenosis measurements (when applicable) are obtained utilizing NASCET criteria, using the distal internal carotid diameter as the denominator.  CONTRAST:  132mL OMNIPAQUE IOHEXOL 350 MG/ML SOLN  COMPARISON:  12/15/2014, 12/10/2014 and 11/28/2014 head CT. 12/01/2014 CT  angiogram of the neck. 11/28/2014 brain MR.  FINDINGS: CT HEAD  Brain: No intracranial hemorrhage.  Subtle hypodensities within the left frontal lobe appear relatively similar to the preoperative 11/28/2014 exam without CT evidence of large acute infarct. MR would prove helpful to evaluate for small or medium size infarct.  No intracranial enhancing lesion.  No hydrocephalus.  Perivascular space left basal ganglia incidentally noted.  Calvarium and skull base: Negative.  Paranasal sinuses: Clear.  Orbits: Negative.  CTA NECK  Aortic arch: 3 vessel aortic arch with atherosclerotic type changes. 30-40% diameter stenosis proximal left common carotid artery. 30% diameter stenosis proximal left subclavian artery.  Right carotid system: Patent right common carotid artery with mild plaque. Plaque right carotid bifurcation with mild narrowing distal right common carotid artery and 46% diameter stenosis proximal right internal carotid artery.  Left carotid system: 30-40% diameter stenosis proximal left common carotid artery. Soft plaque with 30% diameter narrowing mid to distal left common carotid artery. Post left carotid endarterectomy without complication noted. Mild fold at the distal clamp site with with mild narrowing.  Vertebral arteries:Mild narrowing proximal left vertebral artery. No significant narrowing right vertebral artery.  Skeleton: Mild cervical spondylotic changes.  Other neck: No worrisome primary neck mass.  Lung apex: 4 mm noncalcified nodule right lung apex. If the patient is at high risk for bronchogenic carcinoma, follow-up chest CT at 1 year is recommended. If the patient is at low risk, no follow-up is needed. This recommendation follows the consensus statement: Guidelines for Management of Small Pulmonary Nodules Detected on CT Scans: A Statement from the [place] as published in Radiology 2005; 237:395-400.  CTA HEAD  Anterior circulation: Plaque internal carotid artery cavernous  segment with mild to slightly moderate narrowing on the left and mild narrowing on the right. Otherwise anterior circulation without medium or large size vessel significant stenosis or occlusion.  Posterior circulation: Codominant vertebral arteries without significant narrowing of the distal vertebral arteries, basilar artery or major branches.  Venous sinuses: Neck  Anatomic variants: Negative  Delayed phase: Negative  IMPRESSION: CT HEAD  Subtle hypodensities within the left frontal lobe appear relatively similar to the preoperative 11/28/2014 exam without CT evidence of large acute infarct. MR would prove helpful to evaluate for small or medium size infarct.  Perivascular space left basal ganglia incidentally noted.  CTA NECK  Post left carotid endarterectomy without complication noted. Mild fold at the distal clamp site with with mild narrowing. 30-40% diameter stenosis proximal left common carotid artery. Soft plaque with 30% diameter narrowing mid to distal left common carotid artery.  Plaque right carotid bifurcation with mild narrowing distal right common carotid artery and 46% diameter stenosis proximal right internal carotid artery.  Mild narrowing proximal left vertebral artery. No significant narrowing right vertebral artery.  CTA HEAD  Plaque internal carotid artery cavernous segment with mild to slightly moderate narrowing on the left and mild narrowing on the right. Otherwise anterior circulation without medium or large size vessel significant stenosis or occlusion.  Codominant vertebral arteries without significant narrowing of the distal vertebral arteries, basilar artery or major branches.  4 mm noncalcified nodule right lung apex. If the patient is at high risk for bronchogenic carcinoma, follow-up chest CT at 1 year is recommended. If the patient is at low risk, no follow-up is needed. This recommendation follows the consensus statement: Guidelines for Management of Small Pulmonary Nodules  Detected on CT Scans: A Statement from the [place] as published in Radiology 2005; 237:395-400.   Electronically Signed   By: Genia Del M.D.   On: 12/15/2014 16:29    Assessment: 59 y.o. female hx of HTM, DM, TIA, CEA on 9/19 presenting with transient right sided motor, sensory and speech deficits. CT head and CT angiogram stable. Question if this is related to cerebral hyperperfusion syndrome. Currently asymptomatic.    Plan: 1. MRI brain 2. Prophylactic therapy-continue ASA and Plavix 3. Continue gabapentin for symptomatic relief of headache  Jim Like, DO [facility] [phone]  If 7pm- 7am, please page neurology on call as listed in [place]. 12/15/2014, 7:22 PM

## 2014-12-15 NOTE — H&P (Signed)
[facility] History and Physical  Grace Hess [id number] DOB: 1955/08/14    PCP:   Alonza Bogus, MD   Chief Complaint: right sided weakness, aphasia, paresthesia for 15 min after left CEA on Sept 19, 2016.  Code stroke.   HPI: Grace Hess is an 59 y.o. female with hx of hyperlipidemia, HTN, DM, GERD, with prior TIA resulting in left CEA on Sept 19th, 2016 (Dr Early), readmitted for left frontal HA, felt to be due to reperfusion HA, Tx with BP controlled and migraine like HA with Gabapentin which she has been compliant, presented to the ER at [facility] with Code stroke as she had 15 minutes of right sided weakness, slurred speech, and right sided paresthesia.  She had no prior Hx of complex migraine.  In the ER, teleneurologist recommneded admission, and CTA of her neck, which did not show any new problem with her left CEA, with plaque on the right carotid of 46%, and left 30-40% left proximal common carotid and mild fold at the distal clamp site with mild narrowing.  Her EKG showed NSR with no acute ST T changes, serology showed elevated BS to 174, and WBC of 17.5K (she was given steroid last admission for HA).  Hospitalist was asked to admit her for TIA and for observation.  In the ER, she is asymptomatic.   Rewiew of Systems:  Constitutional: Negative for malaise, fever and chills. No significant weight loss or weight gain Eyes: Negative for eye pain, redness and discharge, diplopia, visual changes, or flashes of light. ENMT: Negative for ear pain, hoarseness, nasal congestion, sinus pressure and sore throat. No headaches; tinnitus, drooling, or problem swallowing. Cardiovascular: Negative for chest pain, palpitations, diaphoresis, dyspnea and peripheral edema. ; No orthopnea, PND Respiratory: Negative for cough, hemoptysis, wheezing and stridor. No pleuritic chestpain. Gastrointestinal: Negative for nausea, vomiting, diarrhea, constipation, abdominal pain, melena, blood in  stool, hematemesis, jaundice and rectal bleeding.    Genitourinary: Negative for frequency, dysuria, incontinence,flank pain and hematuria; Musculoskeletal: Negative for back pain and neck pain. Negative for swelling and trauma.;  Skin: . Negative for pruritus, rash, abrasions, bruising and skin lesion.; ulcerations Neuro: Negative for headache, lightheadedness and neck stiffness. Negative for weakness, altered level of consciousness , altered mental status, extremity weakness, burning feet, involuntary movement, seizure and syncope.  Psych: negative for anxiety, depression, insomnia, tearfulness, panic attacks, hallucinations, paranoia, suicidal or homicidal ideation    Past Medical History  Diagnosis Date  . Hypercholesteremia   . Obesity   . Chest pain, unspecified   . Reflux esophagitis   . Cervicalgia   . Hypertension   . Heart murmur   . Type II diabetes mellitus ([place])   . History of hiatal hernia   . GERD (gastroesophageal reflux disease)   . Hepatitis A     "related to [employer]"  . Stroke [facility])     "they said I had a silent stroke a few months back/MRI" (12/10/2014)  . Kidney stones     "no cysto/OR" (12/10/2014)  . TIA (transient ischemic attack) 11/28/2014    [name] 11/28/2014    Past Surgical History  Procedure Laterality Date  . Endarterectomy Left 12/02/2014    Procedure: ENDARTERECTOMY CAROTID;  Surgeon: Rosetta Posner, MD;  Location: [place];  Service: Vascular;  Laterality: Left;  . Cholecystectomy open  1978  . Vaginal hysterectomy  1991    "partial"    Medications:  HOME MEDS: Prior to Admission medications   Medication Sig Start  Date End Date Taking? Authorizing Provider  ALPRAZolam Duanne Moron) 1 MG tablet Take 0.5 mg by mouth 2 (two) times daily.    Yes Historical Provider, MD  aspirin EC 81 MG tablet Take 1 tablet (81 mg total) by mouth daily. Please continue Aspirin and plavix for 6 months, then plavix daily 12/03/14  Yes Ripudeep K Rai, MD  atorvastatin  (LIPITOR) 80 MG tablet Take 1 tablet (80 mg total) by mouth at bedtime. 12/03/14  Yes Ripudeep Krystal Eaton, MD  clopidogrel (PLAVIX) 75 MG tablet Take 1 tablet (75 mg total) by mouth daily. 12/03/14  Yes Ripudeep Krystal Eaton, MD  diphenhydrAMINE (BENADRYL) 25 MG tablet Take 0.5 tablets (12.5 mg total) by mouth every 6 (six) hours as needed. 12/12/14  Yes Samantha J Rhyne, PA-C  fenofibrate 54 MG tablet Take 1 tablet (54 mg total) by mouth daily. 12/03/14  Yes Ripudeep Krystal Eaton, MD  gabapentin (NEURONTIN) 300 MG capsule Take 300 mg by mouth 3 (three) times daily.   Yes Historical Provider, MD  ketorolac (TORADOL) 10 MG tablet Take 1 tablet (10 mg total) by mouth every 6 (six) hours as needed. 12/12/14  Yes Samantha J Rhyne, PA-C  lisinopril (PRINIVIL,ZESTRIL) 5 MG tablet Take 5 mg by mouth daily.   Yes Historical Provider, MD  metFORMIN (GLUCOPHAGE) 1000 MG tablet Take 1 tablet (1,000 mg total) by mouth 2 (two) times daily with a meal. 12/03/14  Yes Ripudeep K Rai, MD  oxyCODONE (OXY IR/ROXICODONE) 5 MG immediate release tablet Take 1 tablet (5 mg total) by mouth every 4 (four) hours as needed for moderate pain. 12/03/14  Yes Ulyses Amor, PA-C  pantoprazole (PROTONIX) 40 MG tablet Take 1 tablet (40 mg total) by mouth daily. 12/03/14  Yes Ripudeep Krystal Eaton, MD  traMADol (ULTRAM) 50 MG tablet Take 1 tablet (50 mg total) by mouth every 6 (six) hours as needed for moderate pain. 12/03/14  Yes Ulyses Amor, PA-C  lisinopril (PRINIVIL,ZESTRIL) 20 MG tablet Take 0.5 tablets (10 mg total) by mouth daily. Patient not taking: Reported on 12/15/2014 12/03/14   Ripudeep Krystal Eaton, MD     Allergies:  Allergies  Allergen Reactions  . Codeine Shortness Of Breath  . Latex Itching  . Sulfa Antibiotics     Childhood allergy    Social History:   reports that she has been smoking Cigarettes.  She has a 51.25 pack-year smoking history. She has never used smokeless tobacco. She reports that she does not drink alcohol or use illicit  drugs.  Family History: Family History  Problem Relation Age of Onset  . COPD Father   . Congestive Heart Failure Mother   . Cancer Brother      Physical Exam: Filed Vitals:   12/15/14 1400 12/15/14 1530 12/15/14 1600 12/15/14 1630  BP: 132/52 151/79 146/78 133/50  Pulse: 72  70   Temp:      TempSrc:      Resp: 16 15 14 18   Height:      SpO2: 94%  92%    Blood pressure 133/50, pulse 70, temperature 98.1 F (36.7 C), temperature source Oral, resp. rate 18, height 5\' 6"  (1.676 m), SpO2 92 %.  GEN:  Pleasant  patient lying in the stretcher in no acute distress; cooperative with exam. PSYCH:  alert and oriented x4; does not appear anxious or depressed; affect is appropriate. HEENT: Mucous membranes pink and anicteric; PERRLA; EOM intact; no cervical lymphadenopathy nor thyromegaly or carotid bruit; no JVD; There were  no stridor. Neck is very supple. Her left carotid surgical site looks clean and non tender.  Breasts:: Not examined CHEST WALL: No tenderness CHEST: Normal respiration, clear to auscultation bilaterally.  HEART: Regular rate and rhythm.  There are no murmur, rub, or gallops.   BACK: No kyphosis or scoliosis; no CVA tenderness ABDOMEN: soft and non-tender; no masses, no organomegaly, normal abdominal bowel sounds; no pannus; no intertriginous candida. There is no rebound and no distention. Rectal Exam: Not done EXTREMITIES: No bone or joint deformity; age-appropriate arthropathy of the hands and knees; no edema; no ulcerations.  There is no calf tenderness. Genitalia: not examined PULSES: 2+ and symmetric SKIN: Normal hydration no rash or ulceration CNS: Cranial nerves 2-12 grossly intact no focal lateralizing neurologic deficit.  Speech is fluent; uvula elevated with phonation, facial symmetry and tongue midline. DTR are normal bilaterally, cerebella exam is intact, barbinski is negative and strengths are equaled bilaterally.  No sensory loss.   Labs on Admission:   Basic Metabolic Panel:  Recent Labs Lab 12/10/14 1353 12/15/14 1310  NA 138 139  K 4.4 5.0  CL 103 105  CO2 25 24  GLUCOSE 162* 119*  BUN 20 33*  CREATININE 0.96 1.12*  CALCIUM 9.5 9.2   Liver Function Tests:  Recent Labs Lab 12/15/14 1310  AST 34  ALT 40  ALKPHOS 55  BILITOT 0.4  PROT 7.2  ALBUMIN 4.0   CBC:  Recent Labs Lab 12/10/14 1353 12/15/14 1310  WBC 15.3* 17.5*  NEUTROABS  --  12.0*  HGB 12.0 11.7*  HCT 35.7* 35.4*  MCV 90.8 91.7  PLT 289 315   Cardiac Enzymes: No results for input(s): CKTOTAL, CKMB, CKMBINDEX, TROPONINI in the last 168 hours.  CBG:  Recent Labs Lab 12/11/14 0600 12/11/14 1119 12/11/14 2130 12/12/14 0618 12/12/14 1118  GLUCAP 218* 256* 173* 227* 174*     Radiological Exams on Admission: Ct Angio Head W/cm &/or Wo Cm  12/15/2014   CLINICAL DATA:  59 year old diabetic hypertensive female with right facial numbness and droop. Post left endarterectomy 12/02/2014. Subsequent encounter.  EXAM: CT ANGIOGRAPHY HEAD AND NECK  TECHNIQUE: Multidetector CT imaging of the head and neck was performed using the standard protocol during bolus administration of intravenous contrast. Multiplanar CT image reconstructions and MIPs were obtained to evaluate the vascular anatomy. Carotid stenosis measurements (when applicable) are obtained utilizing NASCET criteria, using the distal internal carotid diameter as the denominator.  CONTRAST:  120mL OMNIPAQUE IOHEXOL 350 MG/ML SOLN  COMPARISON:  12/15/2014, 12/10/2014 and 11/28/2014 head CT. 12/01/2014 CT angiogram of the neck. 11/28/2014 brain MR.  FINDINGS: CT HEAD  Brain: No intracranial hemorrhage.  Subtle hypodensities within the left frontal lobe appear relatively similar to the preoperative 11/28/2014 exam without CT evidence of large acute infarct. MR would prove helpful to evaluate for small or medium size infarct.  No intracranial enhancing lesion.  No hydrocephalus.  Perivascular space left basal  ganglia incidentally noted.  Calvarium and skull base: Negative.  Paranasal sinuses: Clear.  Orbits: Negative.  CTA NECK  Aortic arch: 3 vessel aortic arch with atherosclerotic type changes. 30-40% diameter stenosis proximal left common carotid artery. 30% diameter stenosis proximal left subclavian artery.  Right carotid system: Patent right common carotid artery with mild plaque. Plaque right carotid bifurcation with mild narrowing distal right common carotid artery and 46% diameter stenosis proximal right internal carotid artery.  Left carotid system: 30-40% diameter stenosis proximal left common carotid artery. Soft plaque with 30% diameter narrowing  mid to distal left common carotid artery. Post left carotid endarterectomy without complication noted. Mild fold at the distal clamp site with with mild narrowing.  Vertebral arteries:Mild narrowing proximal left vertebral artery. No significant narrowing right vertebral artery.  Skeleton: Mild cervical spondylotic changes.  Other neck: No worrisome primary neck mass.  Lung apex: 4 mm noncalcified nodule right lung apex. If the patient is at high risk for bronchogenic carcinoma, follow-up chest CT at 1 year is recommended. If the patient is at low risk, no follow-up is needed. This recommendation follows the consensus statement: Guidelines for Management of Small Pulmonary Nodules Detected on CT Scans: A Statement from the [place] as published in Radiology 2005; 237:395-400.  CTA HEAD  Anterior circulation: Plaque internal carotid artery cavernous segment with mild to slightly moderate narrowing on the left and mild narrowing on the right. Otherwise anterior circulation without medium or large size vessel significant stenosis or occlusion.  Posterior circulation: Codominant vertebral arteries without significant narrowing of the distal vertebral arteries, basilar artery or major branches.  Venous sinuses: Neck  Anatomic variants: Negative  Delayed phase:  Negative  IMPRESSION: CT HEAD  Subtle hypodensities within the left frontal lobe appear relatively similar to the preoperative 11/28/2014 exam without CT evidence of large acute infarct. MR would prove helpful to evaluate for small or medium size infarct.  Perivascular space left basal ganglia incidentally noted.  CTA NECK  Post left carotid endarterectomy without complication noted. Mild fold at the distal clamp site with with mild narrowing. 30-40% diameter stenosis proximal left common carotid artery. Soft plaque with 30% diameter narrowing mid to distal left common carotid artery.  Plaque right carotid bifurcation with mild narrowing distal right common carotid artery and 46% diameter stenosis proximal right internal carotid artery.  Mild narrowing proximal left vertebral artery. No significant narrowing right vertebral artery.  CTA HEAD  Plaque internal carotid artery cavernous segment with mild to slightly moderate narrowing on the left and mild narrowing on the right. Otherwise anterior circulation without medium or large size vessel significant stenosis or occlusion.  Codominant vertebral arteries without significant narrowing of the distal vertebral arteries, basilar artery or major branches.  4 mm noncalcified nodule right lung apex. If the patient is at high risk for bronchogenic carcinoma, follow-up chest CT at 1 year is recommended. If the patient is at low risk, no follow-up is needed. This recommendation follows the consensus statement: Guidelines for Management of Small Pulmonary Nodules Detected on CT Scans: A Statement from the [place] as published in Radiology 2005; 237:395-400.   Electronically Signed   By: Genia Del M.D.   On: 12/15/2014 16:29   Ct Head Wo Contrast  12/15/2014   CLINICAL DATA:  Right-sided weakness with facial droop and dysphasia  EXAM: CT HEAD WITHOUT CONTRAST  TECHNIQUE: Contiguous axial images were obtained from the base of the skull through the vertex  without intravenous contrast.  COMPARISON:  December 10, 2014  FINDINGS: The ventricles are normal in size and configuration. There is mild superior frontal atrophy bilaterally, stable. There is local prevascular prominence in the medial left temporal lobe, stable. This area may represent a tiny arachnoid cyst. There is no other evidence suggesting potential mass. No hemorrhage, extra-axial fluid collection, or midline shift. There is mild small vessel disease in the centra semiovale bilaterally. No acute infarct evident. Both middle cerebral artery show normal attenuation. The bony calvarium appears intact. The mastoid air cells are clear.  IMPRESSION: No evidence acute infarct.  Slight periventricular small vessel disease. Probable prominent perivascular space in the medial left temporal lobe. This finding is stable and benign in appearance. It may represent a small arachnoid cyst. No mass effect or hemorrhage.  Critical Value/emergent results were called by telephone at the time of interpretation on 12/15/2014 at 1:05 pm to Dr. Milton Ferguson , who verbally acknowledged these results.   Electronically Signed   By: Lowella Grip III M.D.   On: 12/15/2014 13:05   Ct Angio Neck W/cm &/or Wo/cm  12/15/2014   CLINICAL DATA:  59 year old diabetic hypertensive female with right facial numbness and droop. Post left endarterectomy 12/02/2014. Subsequent encounter.  EXAM: CT ANGIOGRAPHY HEAD AND NECK  TECHNIQUE: Multidetector CT imaging of the head and neck was performed using the standard protocol during bolus administration of intravenous contrast. Multiplanar CT image reconstructions and MIPs were obtained to evaluate the vascular anatomy. Carotid stenosis measurements (when applicable) are obtained utilizing NASCET criteria, using the distal internal carotid diameter as the denominator.  CONTRAST:  133mL OMNIPAQUE IOHEXOL 350 MG/ML SOLN  COMPARISON:  12/15/2014, 12/10/2014 and 11/28/2014 head CT. 12/01/2014 CT  angiogram of the neck. 11/28/2014 brain MR.  FINDINGS: CT HEAD  Brain: No intracranial hemorrhage.  Subtle hypodensities within the left frontal lobe appear relatively similar to the preoperative 11/28/2014 exam without CT evidence of large acute infarct. MR would prove helpful to evaluate for small or medium size infarct.  No intracranial enhancing lesion.  No hydrocephalus.  Perivascular space left basal ganglia incidentally noted.  Calvarium and skull base: Negative.  Paranasal sinuses: Clear.  Orbits: Negative.  CTA NECK  Aortic arch: 3 vessel aortic arch with atherosclerotic type changes. 30-40% diameter stenosis proximal left common carotid artery. 30% diameter stenosis proximal left subclavian artery.  Right carotid system: Patent right common carotid artery with mild plaque. Plaque right carotid bifurcation with mild narrowing distal right common carotid artery and 46% diameter stenosis proximal right internal carotid artery.  Left carotid system: 30-40% diameter stenosis proximal left common carotid artery. Soft plaque with 30% diameter narrowing mid to distal left common carotid artery. Post left carotid endarterectomy without complication noted. Mild fold at the distal clamp site with with mild narrowing.  Vertebral arteries:Mild narrowing proximal left vertebral artery. No significant narrowing right vertebral artery.  Skeleton: Mild cervical spondylotic changes.  Other neck: No worrisome primary neck mass.  Lung apex: 4 mm noncalcified nodule right lung apex. If the patient is at high risk for bronchogenic carcinoma, follow-up chest CT at 1 year is recommended. If the patient is at low risk, no follow-up is needed. This recommendation follows the consensus statement: Guidelines for Management of Small Pulmonary Nodules Detected on CT Scans: A Statement from the [place] as published in Radiology 2005; 237:395-400.  CTA HEAD  Anterior circulation: Plaque internal carotid artery cavernous  segment with mild to slightly moderate narrowing on the left and mild narrowing on the right. Otherwise anterior circulation without medium or large size vessel significant stenosis or occlusion.  Posterior circulation: Codominant vertebral arteries without significant narrowing of the distal vertebral arteries, basilar artery or major branches.  Venous sinuses: Neck  Anatomic variants: Negative  Delayed phase: Negative  IMPRESSION: CT HEAD  Subtle hypodensities within the left frontal lobe appear relatively similar to the preoperative 11/28/2014 exam without CT evidence of large acute infarct. MR would prove helpful to evaluate for small or medium size infarct.  Perivascular space left basal ganglia incidentally noted.  CTA NECK  Post left carotid  endarterectomy without complication noted. Mild fold at the distal clamp site with with mild narrowing. 30-40% diameter stenosis proximal left common carotid artery. Soft plaque with 30% diameter narrowing mid to distal left common carotid artery.  Plaque right carotid bifurcation with mild narrowing distal right common carotid artery and 46% diameter stenosis proximal right internal carotid artery.  Mild narrowing proximal left vertebral artery. No significant narrowing right vertebral artery.  CTA HEAD  Plaque internal carotid artery cavernous segment with mild to slightly moderate narrowing on the left and mild narrowing on the right. Otherwise anterior circulation without medium or large size vessel significant stenosis or occlusion.  Codominant vertebral arteries without significant narrowing of the distal vertebral arteries, basilar artery or major branches.  4 mm noncalcified nodule right lung apex. If the patient is at high risk for bronchogenic carcinoma, follow-up chest CT at 1 year is recommended. If the patient is at low risk, no follow-up is needed. This recommendation follows the consensus statement: Guidelines for Management of Small Pulmonary Nodules  Detected on CT Scans: A Statement from the [place] as published in Radiology 2005; 237:395-400.   Electronically Signed   By: Genia Del M.D.   On: 12/15/2014 16:29    EKG: Independently reviewed.    Assessment/Plan Present on Admission:  . TIA (transient ischemic attack) . Headache . Essential hypertension . Carotid artery disease ([place]) . Tobacco use disorder   PLAN:  Will admit her for left hemispheric TIA after Left CEA about 2 weeks ago.  Will continue with dual antiplatelet therapy.  I would like to transfer her to [facility] though it is only for observation, since no immediate intervention is required at this time.  I spoke with Dr Leonel Ramsay of [facility], who was kind to see her for consultation.  I will give her Fentanyl for her HA  And continue with her Gabapentin.  Will keep her BP in good range.  Continue her maximum dose of statin at this time. She is stable and asymptomatic at this time.  I appreciate Dr Wynelle Cleveland accepting her in transfer.  Husband updated and is at her bedside.  I did encourage her to quit cigarettes.  Thank you and Good Day.   Other plans as per orders.  Code Status: FULL Haskel Khan, MD. [facility] Pager [phone] 7pm to 7am.  12/15/2014, 5:04 PM

## 2014-12-15 NOTE — ED Notes (Signed)
Faxed consult request form at 13:11.

## 2014-12-15 NOTE — ED Notes (Signed)
Called in [facility] consult at 12:55

## 2014-12-15 NOTE — ED Notes (Signed)
Report given to [place] on 48M at [facility]. All questions answered.

## 2014-12-15 NOTE — ED Notes (Signed)
Pt to CT immediately upon arrival.  Dr. Roderic Palau met pt in hallway outside of room 14.

## 2014-12-15 NOTE — Progress Notes (Signed)
CODE STROKE documentation  12:40 phone call from ED Lana Fish RN,12:44 Code stroke Bee[place],12:54 scan begun, 12:55 pt removed from CT table, Scans sent to [facility] from [place] called [facility] and spoke with Rhonda/bbj

## 2014-12-16 ENCOUNTER — Inpatient Hospital Stay (HOSPITAL_COMMUNITY): Payer: 59

## 2014-12-16 DIAGNOSIS — I10 Essential (primary) hypertension: Secondary | ICD-10-CM | POA: Diagnosis not present

## 2014-12-16 DIAGNOSIS — I779 Disorder of arteries and arterioles, unspecified: Secondary | ICD-10-CM | POA: Diagnosis not present

## 2014-12-16 DIAGNOSIS — F17200 Nicotine dependence, unspecified, uncomplicated: Secondary | ICD-10-CM | POA: Diagnosis not present

## 2014-12-16 LAB — COMPREHENSIVE METABOLIC PANEL
ALBUMIN: 3.5 g/dL (ref 3.5–5.0)
ALT: 33 U/L (ref 14–54)
AST: 21 U/L (ref 15–41)
Alkaline Phosphatase: 50 U/L (ref 38–126)
Anion gap: 9 (ref 5–15)
BILIRUBIN TOTAL: 0.3 mg/dL (ref 0.3–1.2)
BUN: 21 mg/dL — AB (ref 6–20)
CHLORIDE: 102 mmol/L (ref 101–111)
CO2: 25 mmol/L (ref 22–32)
Calcium: 9.2 mg/dL (ref 8.9–10.3)
Creatinine, Ser: 0.88 mg/dL (ref 0.44–1.00)
GFR calc Af Amer: >60 mL/min (ref >60)
GFR calc non Af Amer: >60 mL/min (ref >60)
Glucose, Bld: 137 mg/dL — ABNORMAL HIGH (ref 65–99)
POTASSIUM: 4.4 mmol/L (ref 3.5–5.1)
SODIUM: 136 mmol/L (ref 135–145)
Total Protein: 6 g/dL — ABNORMAL LOW (ref 6.5–8.1)

## 2014-12-16 LAB — CBC
HEMATOCRIT: 31.6 % — AB (ref 36.0–46.0)
HEMOGLOBIN: 10.5 g/dL — AB (ref 12.0–15.0)
MCH: 30.3 pg (ref 26.0–34.0)
MCHC: 33.2 g/dL (ref 30.0–36.0)
MCV: 91.1 fL (ref 78.0–100.0)
Platelets: 282 10*3/uL (ref 150–400)
RBC: 3.47 MIL/uL — ABNORMAL LOW (ref 3.87–5.11)
RDW: 13.8 % (ref 11.5–15.5)
WBC: 12.3 10*3/uL — ABNORMAL HIGH (ref 4.0–10.5)

## 2014-12-16 LAB — GLUCOSE, CAPILLARY
Glucose-Capillary: 124 mg/dL — ABNORMAL HIGH (ref 65–99)
Glucose-Capillary: 223 mg/dL — ABNORMAL HIGH (ref 65–99)
Glucose-Capillary: 227 mg/dL — ABNORMAL HIGH (ref 65–99)

## 2014-12-16 MED ORDER — STROKE: EARLY STAGES OF RECOVERY BOOK
Freq: Once | Status: AC
  Administered 2014-12-16: 01:00:00

## 2014-12-16 MED ORDER — TRAMADOL HCL 50 MG PO TABS
100.0000 mg | ORAL_TABLET | Freq: Four times a day (QID) | ORAL | Status: DC | PRN
  Administered 2014-12-16 – 2014-12-17 (×4): 100 mg via ORAL
  Filled 2014-12-16 (×4): qty 2

## 2014-12-16 MED ORDER — INSULIN ASPART 100 UNIT/ML ~~LOC~~ SOLN
0.0000 [IU] | Freq: Every day | SUBCUTANEOUS | Status: DC
  Administered 2014-12-16: 2 [IU] via SUBCUTANEOUS

## 2014-12-16 MED ORDER — LABETALOL HCL 5 MG/ML IV SOLN
20.0000 mg | Freq: Once | INTRAVENOUS | Status: AC
  Administered 2014-12-16: 20 mg via INTRAVENOUS
  Filled 2014-12-16: qty 4

## 2014-12-16 MED ORDER — INSULIN ASPART 100 UNIT/ML ~~LOC~~ SOLN
0.0000 [IU] | Freq: Three times a day (TID) | SUBCUTANEOUS | Status: DC
  Administered 2014-12-16 (×2): 3 [IU] via SUBCUTANEOUS
  Administered 2014-12-16: 1 [IU] via SUBCUTANEOUS
  Administered 2014-12-17 (×2): 2 [IU] via SUBCUTANEOUS

## 2014-12-16 MED ORDER — LISINOPRIL 10 MG PO TABS
10.0000 mg | ORAL_TABLET | Freq: Every day | ORAL | Status: DC
  Administered 2014-12-17: 10 mg via ORAL
  Filled 2014-12-16: qty 1

## 2014-12-16 NOTE — Progress Notes (Signed)
Pt arrived to room 5M12 via carelink at 1900.  Pt is alert and oriented and states she has numbness in her right hand.  Tele applied and safety measures in place.  [facility] admissions paged to let the MD know pt is here.  [place] admission told RN to contact Rogue Bussing to put in orders.  [facility] Bussing told RN to get [place] to put in the orders since she is on site.  [place] put in some neuro orders but orders were still missing.  [facility] admissions paged and Dr. Posey Pronto was contacted.  Dr. Posey Pronto put in the rest of the orders.  Will continue to monitor.  Fredrich Romans, RN

## 2014-12-16 NOTE — Progress Notes (Signed)
NT responded to bed alarm. Pt found on the couch by the window. NT explained bed alarm policy and pt refused to return to the bed.

## 2014-12-16 NOTE — Progress Notes (Signed)
    [facility] has been requested to perform a transesophageal echocardiogram on [name] on 12/17/14 for CVA.  After careful review of history and examination, the risks and benefits of transesophageal echocardiogram have been explained including risks of esophageal damage, perforation (1:10,000 risk), bleeding, pharyngeal hematoma as well as other potential complications associated with conscious sedation including aspiration, arrhythmia, respiratory failure and death. Alternatives to treatment were discussed, questions were answered. Patient is willing to proceed.   Hx of esophageal dilatation in 2006.  No problems with swallowing recently.   Pt also stated she had sore throat after her CEA, on anesthesia note no problems with intubation.  Earma Nicolaou R, NP 12/16/2014 1:33 PM

## 2014-12-16 NOTE — Progress Notes (Addendum)
[facility] Progress Note   Grace Hess  [id number]  DOB: February 08, 1956  DOA: 12/15/2014 PCP: Alonza Bogus, MD  Brief narrative: Grace Hess is a 59 y.o. female with hypertension, hyperlipidemia, diabetes mellitus status post carotid endarterectomy on 9/19. The patient presented to [facility] ER with a complaint of right arm and leg numbness weakness and trouble with speaking. The episode lasted 15 minutes and resolved. She was admitted to [facility] for further workup.   Subjective: Complained of a frontal headache this morning. She also had some nausea. No vomiting or abdominal pain no blurred vision. No recurrence of neurological symptoms  Assessment/Plan: Principal Problem:   TIA (transient ischemic attack) -MRI of the brain does not reveal a new infarct -CT angiogram of the head and neck shows mild narrowing of 30-40% in the left common carotid and 46% stenosis and right carotid -Has been evaluated by the stroke service and is recommended to have a TEE which will be performed tomorrow -Continue aspirin Plavix and Lipitor  Active Problems: Lung nodule -4 mm noncalcified nodule at the right lung apex - discussed with patient in detail  -Repeat CT in 1 year  Acute renal failure -Mildly elevated creatinine noted on admission which has improved to normal -Continue ACE inhibitor-hold ketorolac  Hypertensive urgency --BP was quite high this morning and she received a dose of labetalol -Continue to follow and adjust medications -I will increase her lisinopril to 10 mg daily which is how she takes it at home  Leukocytosis -Improving steadily without antibiotics -cause uncertain - the patient does not admit to cough, dysuria or diarrhea-continue to follow  Frequent headaches -Hold ketorolac as patient had renal insufficiency on admission -Given tramadol today which helped and resolution of the headache-follow    Tobacco use disorder -Advised to  stop smoking  Diabetes mellitus type II - A1c 8.6 on 11/28/14 - Metformin recently increased- currently on hold due to CTA - cont sliding scale   Code Status:     Code Status Orders        Start     Ordered   12/15/14 1917  Full code   Continuous     12/15/14 1916     Family Communication:  Disposition Plan: home when stable DVT prophylaxis: heparin Consultants:neurology   Antibiotics: Anti-infectives    None      Objective: Filed Weights   12/15/14 1930  Weight: 74.2 kg (163 lb 9.3 oz)   No intake or output data in the 24 hours ending 12/16/14 1637   Vitals Filed Vitals:   12/16/14 0500 12/16/14 0654 12/16/14 0937 12/16/14 1423  BP: 187/76 154/70 201/96 145/56  Pulse: 62 64 73 59  Temp: 98 F (36.7 C) 98.3 F (36.8 C) 98 F (36.7 C) 98.3 F (36.8 C)  TempSrc: Oral Oral Oral Oral  Resp: 20 18 16 17   Height:      Weight:      SpO2: 98% 95% 97% 95%    Exam:  General:  Pt is alert, not in acute distress  HEENT: No icterus, No thrush, oral mucosa moist  Cardiovascular: regular rate and rhythm, S1/S2 No murmur  Respiratory: clear to auscultation bilaterally   Abdomen: Soft, +Bowel sounds, non tender, non distended, no guarding  MSK: No LE edema, cyanosis or clubbing  Data Reviewed: Basic Metabolic Panel:  Recent Labs Lab 12/10/14 1353 12/15/14 1310 12/15/14 2021 12/16/14 0640  NA 138 139  --  136  K 4.4 5.0  --  4.4  CL 103 105  --  102  CO2 25 24  --  25  GLUCOSE 162* 119*  --  137*  BUN 20 33*  --  21*  CREATININE 0.96 1.12* 1.11* 0.88  CALCIUM 9.5 9.2  --  9.2   Liver Function Tests:  Recent Labs Lab 12/15/14 1310 12/16/14 0640  AST 34 21  ALT 40 33  ALKPHOS 55 50  BILITOT 0.4 0.3  PROT 7.2 6.0*  ALBUMIN 4.0 3.5   No results for input(s): LIPASE, AMYLASE in the last 168 hours. No results for input(s): AMMONIA in the last 168 hours. CBC:  Recent Labs Lab 12/10/14 1353 12/15/14 1310 12/15/14 2021 12/16/14 0640   WBC 15.3* 17.5* 13.8* 12.3*  NEUTROABS  --  12.0*  --   --   HGB 12.0 11.7* 10.2* 10.5*  HCT 35.7* 35.4* 31.3* 31.6*  MCV 90.8 91.7 91.0 91.1  PLT 289 315 298 282   Cardiac Enzymes: No results for input(s): CKTOTAL, CKMB, CKMBINDEX, TROPONINI in the last 168 hours. BNP (last 3 results) No results for input(s): BNP in the last 8760 hours.  ProBNP (last 3 results) No results for input(s): PROBNP in the last 8760 hours.  CBG:  Recent Labs Lab 12/12/14 0618 12/12/14 1118 12/15/14 2147 12/16/14 0612 12/16/14 1219  GLUCAP 227* 174* 237* 124* 223*    No results found for this or any previous visit (from the past 240 hour(s)).   Studies: Ct Angio Head W/cm &/or Wo Cm  12/15/2014   CLINICAL DATA:  59 year old diabetic hypertensive female with right facial numbness and droop. Post left endarterectomy 12/02/2014. Subsequent encounter.  EXAM: CT ANGIOGRAPHY HEAD AND NECK  TECHNIQUE: Multidetector CT imaging of the head and neck was performed using the standard protocol during bolus administration of intravenous contrast. Multiplanar CT image reconstructions and MIPs were obtained to evaluate the vascular anatomy. Carotid stenosis measurements (when applicable) are obtained utilizing NASCET criteria, using the distal internal carotid diameter as the denominator.  CONTRAST:  132mL OMNIPAQUE IOHEXOL 350 MG/ML SOLN  COMPARISON:  12/15/2014, 12/10/2014 and 11/28/2014 head CT. 12/01/2014 CT angiogram of the neck. 11/28/2014 brain MR.  FINDINGS: CT HEAD  Brain: No intracranial hemorrhage.  Subtle hypodensities within the left frontal lobe appear relatively similar to the preoperative 11/28/2014 exam without CT evidence of large acute infarct. MR would prove helpful to evaluate for small or medium size infarct.  No intracranial enhancing lesion.  No hydrocephalus.  Perivascular space left basal ganglia incidentally noted.  Calvarium and skull base: Negative.  Paranasal sinuses: Clear.  Orbits: Negative.   CTA NECK  Aortic arch: 3 vessel aortic arch with atherosclerotic type changes. 30-40% diameter stenosis proximal left common carotid artery. 30% diameter stenosis proximal left subclavian artery.  Right carotid system: Patent right common carotid artery with mild plaque. Plaque right carotid bifurcation with mild narrowing distal right common carotid artery and 46% diameter stenosis proximal right internal carotid artery.  Left carotid system: 30-40% diameter stenosis proximal left common carotid artery. Soft plaque with 30% diameter narrowing mid to distal left common carotid artery. Post left carotid endarterectomy without complication noted. Mild fold at the distal clamp site with with mild narrowing.  Vertebral arteries:Mild narrowing proximal left vertebral artery. No significant narrowing right vertebral artery.  Skeleton: Mild cervical spondylotic changes.  Other neck: No worrisome primary neck mass.  Lung apex: 4 mm noncalcified nodule right lung apex. If the patient is at high risk for bronchogenic carcinoma, follow-up chest CT  at 1 year is recommended. If the patient is at low risk, no follow-up is needed. This recommendation follows the consensus statement: Guidelines for Management of Small Pulmonary Nodules Detected on CT Scans: A Statement from the [place] as published in Radiology 2005; 237:395-400.  CTA HEAD  Anterior circulation: Plaque internal carotid artery cavernous segment with mild to slightly moderate narrowing on the left and mild narrowing on the right. Otherwise anterior circulation without medium or large size vessel significant stenosis or occlusion.  Posterior circulation: Codominant vertebral arteries without significant narrowing of the distal vertebral arteries, basilar artery or major branches.  Venous sinuses: Neck  Anatomic variants: Negative  Delayed phase: Negative  IMPRESSION: CT HEAD  Subtle hypodensities within the left frontal lobe appear relatively similar to  the preoperative 11/28/2014 exam without CT evidence of large acute infarct. MR would prove helpful to evaluate for small or medium size infarct.  Perivascular space left basal ganglia incidentally noted.  CTA NECK  Post left carotid endarterectomy without complication noted. Mild fold at the distal clamp site with with mild narrowing. 30-40% diameter stenosis proximal left common carotid artery. Soft plaque with 30% diameter narrowing mid to distal left common carotid artery.  Plaque right carotid bifurcation with mild narrowing distal right common carotid artery and 46% diameter stenosis proximal right internal carotid artery.  Mild narrowing proximal left vertebral artery. No significant narrowing right vertebral artery.  CTA HEAD  Plaque internal carotid artery cavernous segment with mild to slightly moderate narrowing on the left and mild narrowing on the right. Otherwise anterior circulation without medium or large size vessel significant stenosis or occlusion.  Codominant vertebral arteries without significant narrowing of the distal vertebral arteries, basilar artery or major branches.  4 mm noncalcified nodule right lung apex. If the patient is at high risk for bronchogenic carcinoma, follow-up chest CT at 1 year is recommended. If the patient is at low risk, no follow-up is needed. This recommendation follows the consensus statement: Guidelines for Management of Small Pulmonary Nodules Detected on CT Scans: A Statement from the [place] as published in Radiology 2005; 237:395-400.   Electronically Signed   By: Genia Del M.D.   On: 12/15/2014 16:29   Ct Head Wo Contrast  12/15/2014   CLINICAL DATA:  Right-sided weakness with facial droop and dysphasia  EXAM: CT HEAD WITHOUT CONTRAST  TECHNIQUE: Contiguous axial images were obtained from the base of the skull through the vertex without intravenous contrast.  COMPARISON:  December 10, 2014  FINDINGS: The ventricles are normal in size and  configuration. There is mild superior frontal atrophy bilaterally, stable. There is local prevascular prominence in the medial left temporal lobe, stable. This area may represent a tiny arachnoid cyst. There is no other evidence suggesting potential mass. No hemorrhage, extra-axial fluid collection, or midline shift. There is mild small vessel disease in the centra semiovale bilaterally. No acute infarct evident. Both middle cerebral artery show normal attenuation. The bony calvarium appears intact. The mastoid air cells are clear.  IMPRESSION: No evidence acute infarct. Slight periventricular small vessel disease. Probable prominent perivascular space in the medial left temporal lobe. This finding is stable and benign in appearance. It may represent a small arachnoid cyst. No mass effect or hemorrhage.  Critical Value/emergent results were called by telephone at the time of interpretation on 12/15/2014 at 1:05 pm to Dr. Milton Ferguson , who verbally acknowledged these results.   Electronically Signed   By: Lowella Grip III M.D.  On: 12/15/2014 13:05   Ct Angio Neck W/cm &/or Wo/cm  12/15/2014   CLINICAL DATA:  59 year old diabetic hypertensive female with right facial numbness and droop. Post left endarterectomy 12/02/2014. Subsequent encounter.  EXAM: CT ANGIOGRAPHY HEAD AND NECK  TECHNIQUE: Multidetector CT imaging of the head and neck was performed using the standard protocol during bolus administration of intravenous contrast. Multiplanar CT image reconstructions and MIPs were obtained to evaluate the vascular anatomy. Carotid stenosis measurements (when applicable) are obtained utilizing NASCET criteria, using the distal internal carotid diameter as the denominator.  CONTRAST:  152mL OMNIPAQUE IOHEXOL 350 MG/ML SOLN  COMPARISON:  12/15/2014, 12/10/2014 and 11/28/2014 head CT. 12/01/2014 CT angiogram of the neck. 11/28/2014 brain MR.  FINDINGS: CT HEAD  Brain: No intracranial hemorrhage.  Subtle  hypodensities within the left frontal lobe appear relatively similar to the preoperative 11/28/2014 exam without CT evidence of large acute infarct. MR would prove helpful to evaluate for small or medium size infarct.  No intracranial enhancing lesion.  No hydrocephalus.  Perivascular space left basal ganglia incidentally noted.  Calvarium and skull base: Negative.  Paranasal sinuses: Clear.  Orbits: Negative.  CTA NECK  Aortic arch: 3 vessel aortic arch with atherosclerotic type changes. 30-40% diameter stenosis proximal left common carotid artery. 30% diameter stenosis proximal left subclavian artery.  Right carotid system: Patent right common carotid artery with mild plaque. Plaque right carotid bifurcation with mild narrowing distal right common carotid artery and 46% diameter stenosis proximal right internal carotid artery.  Left carotid system: 30-40% diameter stenosis proximal left common carotid artery. Soft plaque with 30% diameter narrowing mid to distal left common carotid artery. Post left carotid endarterectomy without complication noted. Mild fold at the distal clamp site with with mild narrowing.  Vertebral arteries:Mild narrowing proximal left vertebral artery. No significant narrowing right vertebral artery.  Skeleton: Mild cervical spondylotic changes.  Other neck: No worrisome primary neck mass.  Lung apex: 4 mm noncalcified nodule right lung apex. If the patient is at high risk for bronchogenic carcinoma, follow-up chest CT at 1 year is recommended. If the patient is at low risk, no follow-up is needed. This recommendation follows the consensus statement: Guidelines for Management of Small Pulmonary Nodules Detected on CT Scans: A Statement from the [place] as published in Radiology 2005; 237:395-400.  CTA HEAD  Anterior circulation: Plaque internal carotid artery cavernous segment with mild to slightly moderate narrowing on the left and mild narrowing on the right. Otherwise anterior  circulation without medium or large size vessel significant stenosis or occlusion.  Posterior circulation: Codominant vertebral arteries without significant narrowing of the distal vertebral arteries, basilar artery or major branches.  Venous sinuses: Neck  Anatomic variants: Negative  Delayed phase: Negative  IMPRESSION: CT HEAD  Subtle hypodensities within the left frontal lobe appear relatively similar to the preoperative 11/28/2014 exam without CT evidence of large acute infarct. MR would prove helpful to evaluate for small or medium size infarct.  Perivascular space left basal ganglia incidentally noted.  CTA NECK  Post left carotid endarterectomy without complication noted. Mild fold at the distal clamp site with with mild narrowing. 30-40% diameter stenosis proximal left common carotid artery. Soft plaque with 30% diameter narrowing mid to distal left common carotid artery.  Plaque right carotid bifurcation with mild narrowing distal right common carotid artery and 46% diameter stenosis proximal right internal carotid artery.  Mild narrowing proximal left vertebral artery. No significant narrowing right vertebral artery.  CTA HEAD  Plaque internal  carotid artery cavernous segment with mild to slightly moderate narrowing on the left and mild narrowing on the right. Otherwise anterior circulation without medium or large size vessel significant stenosis or occlusion.  Codominant vertebral arteries without significant narrowing of the distal vertebral arteries, basilar artery or major branches.  4 mm noncalcified nodule right lung apex. If the patient is at high risk for bronchogenic carcinoma, follow-up chest CT at 1 year is recommended. If the patient is at low risk, no follow-up is needed. This recommendation follows the consensus statement: Guidelines for Management of Small Pulmonary Nodules Detected on CT Scans: A Statement from the [place] as published in Radiology 2005; 237:395-400.    Electronically Signed   By: Genia Del M.D.   On: 12/15/2014 16:29   Mr Brain Wo Contrast  12/16/2014   CLINICAL DATA:  LEFT frontal headache, felt to represent reperfusion headache, status post LEFT carotid endarterectomy December 02, 2014. 15 minutes episode of RIGHT-sided weakness and slurred speech, RIGHT paresthesia. History of hypertension, carotid artery disease, hypercholesterolemia, diabetes.  EXAM: MRI HEAD WITHOUT CONTRAST  TECHNIQUE: Multiplanar, multiecho pulse sequences of the brain and surrounding structures were obtained without intravenous contrast.  COMPARISON:  CT head December 15, 2014 and MRI brain September 27, 2014  FINDINGS: The ventricles and sulci are normal for patient's age. No abnormal parenchymal signal, mass lesions, mass effect. No reduced diffusion to suggest acute ischemia. No susceptibility artifact to suggest hemorrhage. LEFT inferior basal ganglia perivascular space. Minimal white matter changes, predominately in LEFT frontal lobe compatible with chronic small vessel ischemic disease, less than expected for age.  No abnormal extra-axial fluid collections. No extra-axial masses though, contrast enhanced sequences would be more sensitive. Normal major intracranial vascular flow voids seen at the skull base.  Ocular globes and orbital contents are unremarkable though not tailored for evaluation. No abnormal sellar expansion. Mild paranasal sinus mucosal thickening without air-fluid levels. The mastoid air cells are well aerated. No suspicious calvarial bone marrow signal. Craniocervical junction maintained.  IMPRESSION: No acute intracranial process.  Minimal white matter changes compatible with chronic small vessel ischemic disease, less than expected for age.   Electronically Signed   By: Elon Alas M.D.   On: 12/16/2014 02:53    Scheduled Meds:  Scheduled Meds: . ALPRAZolam  0.5 mg Oral BID  . aspirin EC  81 mg Oral Daily  . atorvastatin  80 mg Oral QHS  .  clopidogrel  75 mg Oral Daily  . docusate sodium  100 mg Oral BID  . gabapentin  300 mg Oral TID  . heparin  5,000 Units Subcutaneous 3 times per day  . insulin aspart  0-5 Units Subcutaneous QHS  . insulin aspart  0-9 Units Subcutaneous TID WC  . lisinopril  5 mg Oral Daily  . pantoprazole  40 mg Oral Daily  . sodium chloride  3 mL Intravenous Q12H  . sodium chloride  3 mL Intravenous Q12H   Continuous Infusions:   Time spent on care of this patient: 35 min   [place], MD 12/16/2014, 4:38 PM  LOS: 1 day   [facility] Office  [phone] Pager - Text Page per [email or web address] If 7PM-7AM, please contact night-coverage [email or web address]

## 2014-12-16 NOTE — Progress Notes (Signed)
STROKE TEAM PROGRESS NOTE  Grace Hess is an 59 y.o. female hx of HTN, DM, TIA, CEA on Sept 19, 2016, recent admission for frontal HA felt to be reperfusion headache. Presents to the ED at [facility] with 15 minutes of right sided weakness, slurred speech and paresthesias on the right side. Evaluated by teleneurologist, ordered CTA of her neck which was stable. No intervention given. Was asymptomatic in the ED.  Headache  improved with addition of gabapentin. CT head imaging reviewed, CT shows no acute infarct. CTA shows 30-40% stenosis of L ICA, 46% stenosis proximal right ICA.  Date last known well: 10/02 Time last known well: 1100 tPA Given: no, symptoms resolved upon arrival to ED Modified Rankin: Rankin Score=0  SUBJECTIVE (INTERVAL HISTORY) Her friend from church is at the bedside. Overall she feels her condition is rapidly improving. She recounted her HPI with Dr. Leonie Man. Reports right side numbness. She complains of continued headache post op   OBJECTIVE Temp:  [98 F (36.7 C)-98.3 F (36.8 C)] 98.3 F (36.8 C) (10/03 1423) Pulse Rate:  [59-79] 59 (10/03 1423) Cardiac Rhythm:  [-] Normal sinus rhythm (10/03 0850) Resp:  [11-20] 17 (10/03 1423) BP: (123-201)/(46-99) 145/56 mmHg (10/03 1423) SpO2:  [92 %-99 %] 95 % (10/03 1423) Weight:  [74.2 kg (163 lb 9.3 oz)] 74.2 kg (163 lb 9.3 oz) (10/02 1930)  CBC:  Recent Labs Lab 12/15/14 1310 12/15/14 2021 12/16/14 0640  WBC 17.5* 13.8* 12.3*  NEUTROABS 12.0*  --   --   HGB 11.7* 10.2* 10.5*  HCT 35.4* 31.3* 31.6*  MCV 91.7 91.0 91.1  PLT 315 298 [id number]    Basic Metabolic Panel:  Recent Labs Lab 12/15/14 1310 12/15/14 2021 12/16/14 0640  NA 139  --  136  K 5.0  --  4.4  CL 105  --  102  CO2 24  --  25  GLUCOSE 119*  --  137*  BUN 33*  --  21*  CREATININE 1.12* 1.11* 0.88  CALCIUM 9.2  --  9.2    Lipid Panel:    Component Value Date/Time   CHOL 303* 11/29/2014 0630   TRIG 967* 11/29/2014 0630   HDL 30*  11/29/2014 0630   CHOLHDL 10.1 11/29/2014 0630   VLDL UNABLE TO CALCULATE IF TRIGLYCERIDE OVER 400 mg/dL 11/29/2014 0630   LDLCALC UNABLE TO CALCULATE IF TRIGLYCERIDE OVER 400 mg/dL 11/29/2014 0630   HgbA1c:  Lab Results  Component Value Date   HGBA1C 8.6* 11/28/2014   Urine Drug Screen:    Component Value Date/Time   LABOPIA NONE DETECTED 11/28/2014 1300   COCAINSCRNUR NONE DETECTED 11/28/2014 1300   LABBENZ POSITIVE* 11/28/2014 1300   AMPHETMU NONE DETECTED 11/28/2014 1300   THCU NONE DETECTED 11/28/2014 1300   LABBARB NONE DETECTED 11/28/2014 1300      IMAGING  Ct Head Wo Contrast 12/15/2014 No evidence acute infarct. Slight periventricular small vessel disease. Probable prominent perivascular space in the medial left temporal lobe. This finding is stable and benign in appearance. It may represent a small arachnoid cyst. No mass effect or hemorrhage.   CT HEAD  12/15/2014 Subtle hypodensities within the left frontal lobe appear relatively similar to the preoperative 11/28/2014 exam without CT evidence of large acute infarct. MR would prove helpful to evaluate for small or medium size infarct. Perivascular space left basal ganglia incidentally noted.   CTA angio NECK 12/15/2014 Post left carotid endarterectomy without complication noted. Mild fold at the distal clamp site with  with mild narrowing. 30-40% diameter stenosis proximal left common carotid artery. Soft plaque with 30% diameter narrowing mid to distal left common carotid artery. Plaque right carotid bifurcation with mild narrowing distal right common carotid artery and 46% diameter stenosis proximal right internal carotid artery. Mild narrowing proximal left vertebral artery. No significant narrowing right vertebral artery.   CTA Angio HEAD  12/15/2014 Plaque internal carotid artery cavernous segment with mild to slightly moderate narrowing on the left and mild narrowing on the right. Otherwise anterior  circulation without medium or large size vessel significant stenosis or occlusion. Codominant vertebral arteries without significant narrowing of the distal vertebral arteries, basilar artery or major branches. 4 mm noncalcified nodule right lung apex.   Mr Brain Wo Contrast 12/16/2014 No acute intracranial process. Minimal white matter changes compatible with chronic small vessel ischemic disease, less than expected for age.    PHYSICAL EXAM Pleasant middle-aged Caucasian lady sitting in a bedside chair comfortably. Left carotid surgery wound has healed well. . Afebrile. Head is nontraumatic. Neck is supple without bruit.    Cardiac exam no murmur or gallop. Lungs are clear to auscultation. Distal pulses are well felt. Neurological Exam : Awake alert oriented 3 with normal speech and language function. No aphasia, dysarthria or ataxia. Pupils are equal reactive. Fundi were not visualized. Vision acuity seems adequate. Visual fields are not deficit. Face is symmetric without weakness. Tongue is midline. Good cough and gag. Motor system exam revealed no upper or lower extremity drift. Symmetric and equal strength in all 4 extremities. Deep tendon reflexes are symmetric. Plantars are downgoing. Sensation is intact. Gait was not tested. ASSESSMENT/PLAN [name] is a 59 y.o. female with history of hyperlipidemia, HTN, DM, GERD, with prior TIA resulting in left CEA on Sept 19th, 2016 (Dr Early), readmitted for left frontal HA, felt to be due to reperfusion HA, Tx with BP controlled and migraine like HA with Gabapentin which she has been compliant, presenting with right sided weakness, aphasia, paresthesias.She did not receive IV t-PA due to resolved symptoms.   L brain TIA post L CEA  Resultant  Neuro deficits resolved  MRI  No acute stroke  CTA head and neck  No large vessel stenosis  Heparin 5000 units sq tid for VTE prophylaxis  Diet heart healthy/carb modified Room service  appropriate?: Yes; Fluid consistency:: Thin  aspirin 81 mg orally every day and clopidogrel 75 mg orally every day prior to admission, now on aspirin 81 mg orally every day and clopidogrel 75 mg orally every day  Patient counseled to be compliant with her antithrombotic medications  Ongoing aggressive stroke risk factor management  Therapy recommendations:  No therapy needs  Disposition:  Return home  Headache   since CEA  Felt to be reperfusion headache per surgeon  Ultram 100 mg q6 prn  Accelerated Hypertension  BP 189/114, 201/96 on arrival  Last BP Stable  Hyperlipidemia  Home meds:  lipitor 80, resumed in hospital  Continue statin at discharge  Diabetes, type II  uncontrolled  Last HgbA1c 8.6 in Sept, goal < 7.0   Other Stroke Risk Factors  Cigarette smoker, advised to stop smoking  Hx silent stroke on MRI (11/2014 MRI)  Hospital day # 1  Radene Journey [facility] See Amion for Pager information 12/16/2014 3:00 PM  I have personally examined this patient, reviewed notes, independently viewed imaging studies, participated in medical decision making and plan of care. I have made any additions or clarifications  directly to the above note. Agree with note above.  She presented with transient slurred speech and facial droop and numbness likely due to a left brain TIA. She remains at ongoing risk for neurological worsening, recurrent stroke, TIA and needs current workup and aggressive risk factor modification. CT angiogram shows patent recent left carotid endarterectomy site without significant restenosis. Recommend Ultram 100 mg every 6 hourly for headaches and continue aspirin for stroke prevention. I counseled the patient to quit smoking completely.  Antony Contras, MD Medical Director [facility] Pager: [phone] 12/16/2014 3:11 PM    To contact Stroke Continuity provider, please refer to [email or web address] After hours, contact General  Neurology

## 2014-12-17 ENCOUNTER — Encounter (HOSPITAL_COMMUNITY)
Admission: EM | Disposition: A | Discharge status: Home / Self Care | Payer: Self-pay | Source: Home / Self Care | Attending: Emergency Medicine

## 2014-12-17 DIAGNOSIS — R51 Headache: Secondary | ICD-10-CM

## 2014-12-17 DIAGNOSIS — F17200 Nicotine dependence, unspecified, uncomplicated: Secondary | ICD-10-CM

## 2014-12-17 DIAGNOSIS — G459 Transient cerebral ischemic attack, unspecified: Secondary | ICD-10-CM | POA: Diagnosis not present

## 2014-12-17 DIAGNOSIS — I10 Essential (primary) hypertension: Secondary | ICD-10-CM | POA: Diagnosis not present

## 2014-12-17 DIAGNOSIS — I779 Disorder of arteries and arterioles, unspecified: Secondary | ICD-10-CM | POA: Diagnosis not present

## 2014-12-17 DIAGNOSIS — E781 Pure hyperglyceridemia: Secondary | ICD-10-CM

## 2014-12-17 LAB — CBC
HEMATOCRIT: 33.6 % — AB (ref 36.0–46.0)
HEMOGLOBIN: 10.9 g/dL — AB (ref 12.0–15.0)
MCH: 30 pg (ref 26.0–34.0)
MCHC: 32.4 g/dL (ref 30.0–36.0)
MCV: 92.6 fL (ref 78.0–100.0)
Platelets: 322 10*3/uL (ref 150–400)
RBC: 3.63 MIL/uL — AB (ref 3.87–5.11)
RDW: 13.9 % (ref 11.5–15.5)
WBC: 15 10*3/uL — ABNORMAL HIGH (ref 4.0–10.5)

## 2014-12-17 LAB — PROTIME-INR
INR: 1.08 (ref 0.00–1.49)
PROTHROMBIN TIME: 14.2 s (ref 11.6–15.2)

## 2014-12-17 LAB — BASIC METABOLIC PANEL
ANION GAP: 10 (ref 5–15)
BUN: 20 mg/dL (ref 6–20)
CALCIUM: 9.2 mg/dL (ref 8.9–10.3)
CHLORIDE: 99 mmol/L — AB (ref 101–111)
CO2: 25 mmol/L (ref 22–32)
Creatinine, Ser: 0.88 mg/dL (ref 0.44–1.00)
GFR calc non Af Amer: >60 mL/min (ref >60)
GLUCOSE: 191 mg/dL — AB (ref 65–99)
POTASSIUM: 4.8 mmol/L (ref 3.5–5.1)
Sodium: 134 mmol/L — ABNORMAL LOW (ref 135–145)

## 2014-12-17 LAB — GLUCOSE, CAPILLARY
GLUCOSE-CAPILLARY: 180 mg/dL — AB (ref 65–99)
Glucose-Capillary: 181 mg/dL — ABNORMAL HIGH (ref 65–99)
Glucose-Capillary: 210 mg/dL — ABNORMAL HIGH (ref 65–99)
Glucose-Capillary: 171 mg/dL — ABNORMAL HIGH (ref 65–99)

## 2014-12-17 SURGERY — ECHOCARDIOGRAM, TRANSESOPHAGEAL
Anesthesia: Moderate Sedation

## 2014-12-17 MED ORDER — TRAMADOL HCL 50 MG PO TABS: 50.0000 mg | ORAL_TABLET | Freq: Four times a day (QID) | ORAL | Status: DC | PRN

## 2014-12-17 MED ORDER — FENOFIBRATE 145 MG PO TABS: 145.0000 mg | ORAL_TABLET | Freq: Every day | ORAL | Status: DC

## 2014-12-17 MED ORDER — LISINOPRIL 20 MG PO TABS: 10.0000 mg | ORAL_TABLET | Freq: Every day | ORAL | Status: DC

## 2014-12-17 MED ORDER — TOPIRAMATE 25 MG PO TABS: 25.0000 mg | ORAL_TABLET | Freq: Two times a day (BID) | ORAL | Status: DC

## 2014-12-17 MED ORDER — TOPIRAMATE 25 MG PO TABS
25.0000 mg | ORAL_TABLET | Freq: Two times a day (BID) | ORAL | Status: DC
  Administered 2014-12-17: 25 mg via ORAL
  Filled 2014-12-17: qty 1

## 2014-12-17 NOTE — Progress Notes (Signed)
STROKE TEAM PROGRESS NOTE   SUBJECTIVE (INTERVAL HISTORY) Her husband is at the bedside. Patient surprised that she is not having a test.   OBJECTIVE Temp:  [97.8 F (36.6 C)-98.3 F (36.8 C)] 97.8 F (36.6 C) (10/04 0559) Pulse Rate:  [59-69] 69 (10/04 0559) Cardiac Rhythm:  [-] Normal sinus rhythm (10/04 0810) Resp:  [16-17] 16 (10/04 0559) BP: (120-151)/(46-60) 149/46 mmHg (10/04 0559) SpO2:  [92 %-98 %] 98 % (10/04 0559)  CBC:  Recent Labs Lab 12/15/14 1310  12/16/14 0640 12/17/14 0631  WBC 17.5*  < > 12.3* 15.0*  NEUTROABS 12.0*  --   --   --   HGB 11.7*  < > 10.5* 10.9*  HCT 35.4*  < > 31.6* 33.6*  MCV 91.7  < > 91.1 92.6  PLT 315  < > 282 322  < > = values in this interval not displayed.  Basic Metabolic Panel:   Recent Labs Lab 12/16/14 0640 12/17/14 0631  NA 136 134*  K 4.4 4.8  CL 102 99*  CO2 25 25  GLUCOSE 137* 191*  BUN 21* 20  CREATININE 0.88 0.88  CALCIUM 9.2 9.2    Lipid Panel:     Component Value Date/Time   CHOL 303* 11/29/2014 0630   TRIG 967* 11/29/2014 0630   HDL 30* 11/29/2014 0630   CHOLHDL 10.1 11/29/2014 0630   VLDL UNABLE TO CALCULATE IF TRIGLYCERIDE OVER 400 mg/dL 11/29/2014 0630   LDLCALC UNABLE TO CALCULATE IF TRIGLYCERIDE OVER 400 mg/dL 11/29/2014 0630   HgbA1c:  Lab Results  Component Value Date   HGBA1C 8.6* 11/28/2014   Urine Drug Screen:     Component Value Date/Time   LABOPIA NONE DETECTED 11/28/2014 1300   COCAINSCRNUR NONE DETECTED 11/28/2014 1300   LABBENZ POSITIVE* 11/28/2014 1300   AMPHETMU NONE DETECTED 11/28/2014 1300   THCU NONE DETECTED 11/28/2014 1300   LABBARB NONE DETECTED 11/28/2014 1300      IMAGING  Ct Head Wo Contrast 12/15/2014 No evidence acute infarct. Slight periventricular small vessel disease. Probable prominent perivascular space in the medial left temporal lobe. This finding is stable and benign in appearance. It may represent a small arachnoid cyst. No mass effect or hemorrhage.    CT HEAD  12/15/2014 Subtle hypodensities within the left frontal lobe appear relatively similar to the preoperative 11/28/2014 exam without CT evidence of large acute infarct. MR would prove helpful to evaluate for small or medium size infarct. Perivascular space left basal ganglia incidentally noted.   CTA angio NECK 12/15/2014 Post left carotid endarterectomy without complication noted. Mild fold at the distal clamp site with with mild narrowing. 30-40% diameter stenosis proximal left common carotid artery. Soft plaque with 30% diameter narrowing mid to distal left common carotid artery. Plaque right carotid bifurcation with mild narrowing distal right common carotid artery and 46% diameter stenosis proximal right internal carotid artery. Mild narrowing proximal left vertebral artery. No significant narrowing right vertebral artery.   CTA Angio HEAD  12/15/2014 Plaque internal carotid artery cavernous segment with mild to slightly moderate narrowing on the left and mild narrowing on the right. Otherwise anterior circulation without medium or large size vessel significant stenosis or occlusion. Codominant vertebral arteries without significant narrowing of the distal vertebral arteries, basilar artery or major branches. 4 mm noncalcified nodule right lung apex.   Mr Brain Wo Contrast 12/16/2014 No acute intracranial process. Minimal white matter changes compatible with chronic small vessel ischemic disease, less than expected for age.  PHYSICAL EXAM Pleasant middle-aged Caucasian lady sitting in a bedside chair comfortably. Left carotid surgery wound has healed well. . Afebrile. Head is nontraumatic. Neck is supple without bruit.    Cardiac exam no murmur or gallop. Lungs are clear to auscultation. Distal pulses are well felt. Neurological Exam : Awake alert oriented 3 with normal speech and language function. No aphasia, dysarthria or ataxia. Pupils are equal reactive. Fundi  were not visualized. Vision acuity seems adequate. Visual fields are not deficit. Face is symmetric without weakness. Tongue is midline. Good cough and gag. Motor system exam revealed no upper or lower extremity drift. Symmetric and equal strength in all 4 extremities. Deep tendon reflexes are symmetric. Plantars are downgoing. Sensation is intact. Gait was not tested.   ASSESSMENT/PLAN Grace Hess is a 59 y.o. female with history of hyperlipidemia, HTN, DM, GERD, with prior TIA resulting in left CEA on Sept 19th, 2016 (Dr Early), readmitted for left frontal HA, felt to be due to reperfusion HA, Tx with BP controlled and migraine like HA with Gabapentin which she has been compliant, presenting with right sided weakness, aphasia, paresthesias.She did not receive IV t-PA due to resolved symptoms.   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 TEE canceled. Ok to resume diet (ordered)  aspirin 81 mg orally every day and clopidogrel 75 mg orally every day prior to admission, now on aspirin 81 mg orally every day and clopidogrel 75 mg orally every day  Patient counseled to be compliant with her antithrombotic medications  Ongoing aggressive stroke risk factor management  Therapy recommendations:  No therapy needs  Disposition:  Return home. [place] for discharge today  Follow up Dr. Leonie Man in 2 months, HA management  Accelerated Hypertension  BP 189/114, 201/96 on arrival  Last BP Stable  Hyperlipidemia  Home meds:  lipitor 80, resumed in hospital  Continue statin at discharge  Diabetes, type II  uncontrolled  Last HgbA1c 8.6 in  Sept, goal < 7.0   Other Stroke Risk Factors  Cigarette smoker, advised to stop smoking  Hx silent stroke on MRI (11/2014 MRI)  Hospital day # [place] for Pager information 12/17/2014 10:09 AM  I have personally examined this patient, reviewed notes, independently viewed imaging studies, participated in medical decision making and plan of care. I have made any additions or clarifications directly to the above note. Agree with note above. I had a long discussion the patient and husband regarding her presentation with (TIA likely being related to her hypoperfusion syndrome following recent carotid surgery. I do not believe a TEE is necessary and we will cancel it. She will take Topamax for headache prevention and follow-up in the office as an outpatient and call earlier if necessary  Grace Hess, [place] Pager: [phone] 12/17/2014 1:47 PM  To contact Stroke Continuity provider, please refer to [email or web address] After hours, contact General Neurology

## 2014-12-17 NOTE — Therapy (Addendum)
SLP Cancellation Note  SLE not completed at this time. DC planned today, and pt politely declined evaluation. Pt reports intermittent but improving "jumbling" of her words, and indicated that she prefers to wait for evaluation through home health or outpatient if needed. Pt was encouraged to contact physician if needs arise. ST to sign off.   Grace Hess B. Quentin Ore [facility], [place] [phone]

## 2014-12-17 NOTE — Discharge Summary (Signed)
PATIENT DETAILS Name: Grace Hess Age: 59 y.o. Sex: female Date of Birth: December 03, 1955 MRN: [id number]. Admitting Physician: Orvan Falconer, MD KN:[id number] L, MD  Admit Date: 12/15/2014 Discharge date: 12/17/2014  Recommendations for Outpatient Follow-up:  1. Please repeat A1c and lipid panel (TG's >900!) in the next 3 months. 2. Continue to optimize antihypertensives medications. 3.  4 mm noncalcified nodule seen at the right lung apex-please repeat CT chest in the next 6 months to one year.  4. Please repeat CBC at next visit-has leukocytosis of unknown etiology. Afebrile and without any signs of infection this admission 5. Please continue to counsel him regarding avoidance of tobacco use.  PRIMARY DISCHARGE DIAGNOSIS:  Principal Problem:   TIA (transient ischemic attack) Active Problems:   Essential hypertension   Tobacco use disorder   Carotid artery disease ([place])   Headache      PAST MEDICAL HISTORY: Past Medical History  Diagnosis Date  . Hypercholesteremia   . Obesity   . Chest pain, unspecified   . Reflux esophagitis   . Cervicalgia   . Hypertension   . Heart murmur   . Type II diabetes mellitus ([place])   . History of hiatal hernia   . GERD (gastroesophageal reflux disease)   . Hepatitis A     "related to [employer]"  . Stroke [facility])     "they said I had a silent stroke a few months back/MRI" (12/10/2014)  . Kidney stones     "no cysto/OR" (12/10/2014)  . TIA (transient ischemic attack) 11/28/2014    [name] 11/28/2014    DISCHARGE MEDICATIONS: Current Discharge Medication List    START taking these medications   Details  topiramate (TOPAMAX) 25 MG tablet Take 1 tablet (25 mg total) by mouth 2 (two) times daily. Qty: 60 tablet, Refills: 0      CONTINUE these medications which have CHANGED   Details  lisinopril (PRINIVIL,ZESTRIL) 20 MG tablet Take 0.5 tablets (10 mg total) by mouth daily. Qty: 30 tablet, Refills: 0    traMADol (ULTRAM) 50 MG tablet  Take 1 tablet (50 mg total) by mouth every 6 (six) hours as needed for moderate pain. Qty: 20 tablet, Refills: 0      CONTINUE these medications which have NOT CHANGED   Details  ALPRAZolam (XANAX) 1 MG tablet Take 0.5 mg by mouth 2 (two) times daily.     aspirin EC 81 MG tablet Take 1 tablet (81 mg total) by mouth daily. Please continue Aspirin and plavix for 6 months, then plavix daily Qty: 30 tablet, Refills: 6    atorvastatin (LIPITOR) 80 MG tablet Take 1 tablet (80 mg total) by mouth at bedtime. Qty: 30 tablet, Refills: 3    clopidogrel (PLAVIX) 75 MG tablet Take 1 tablet (75 mg total) by mouth daily. Qty: 30 tablet, Refills: 3    diphenhydrAMINE (BENADRYL) 25 MG tablet Take 0.5 tablets (12.5 mg total) by mouth every 6 (six) hours as needed. Qty: 20 tablet, Refills: 0    gabapentin (NEURONTIN) 300 MG capsule Take 300 mg by mouth 3 (three) times daily.    metFORMIN (GLUCOPHAGE) 1000 MG tablet Take 1 tablet (1,000 mg total) by mouth 2 (two) times daily with a meal. Qty: 60 tablet, Refills: 3    pantoprazole (PROTONIX) 40 MG tablet Take 1 tablet (40 mg total) by mouth daily. Qty: 30 tablet, Refills: 4      STOP taking these medications     fenofibrate 54 MG tablet  ketorolac (TORADOL) 10 MG tablet      oxyCODONE (OXY IR/ROXICODONE) 5 MG immediate release tablet         ALLERGIES:   Allergies  Allergen Reactions  . Codeine Shortness Of Breath  . Latex Itching  . Sulfa Antibiotics     Childhood allergy    BRIEF HPI:  See H&P, Labs, Consult and Test reports for all details in brief, patient was admitted for evaluation of right-sided weakness, aphasia and paresthesias.  CONSULTATIONS:   neurology  PERTINENT RADIOLOGIC STUDIES: Ct Angio Head W/cm &/or Wo Cm  12/15/2014   CLINICAL DATA:  59 year old diabetic hypertensive female with right facial numbness and droop. Post left endarterectomy 12/02/2014. Subsequent encounter.  EXAM: CT ANGIOGRAPHY HEAD AND NECK   TECHNIQUE: Multidetector CT imaging of the head and neck was performed using the standard protocol during bolus administration of intravenous contrast. Multiplanar CT image reconstructions and MIPs were obtained to evaluate the vascular anatomy. Carotid stenosis measurements (when applicable) are obtained utilizing NASCET criteria, using the distal internal carotid diameter as the denominator.  CONTRAST:  154mL OMNIPAQUE IOHEXOL 350 MG/ML SOLN  COMPARISON:  12/15/2014, 12/10/2014 and 11/28/2014 head CT. 12/01/2014 CT angiogram of the neck. 11/28/2014 brain MR.  FINDINGS: CT HEAD  Brain: No intracranial hemorrhage.  Subtle hypodensities within the left frontal lobe appear relatively similar to the preoperative 11/28/2014 exam without CT evidence of large acute infarct. MR would prove helpful to evaluate for small or medium size infarct.  No intracranial enhancing lesion.  No hydrocephalus.  Perivascular space left basal ganglia incidentally noted.  Calvarium and skull base: Negative.  Paranasal sinuses: Clear.  Orbits: Negative.  CTA NECK  Aortic arch: 3 vessel aortic arch with atherosclerotic type changes. 30-40% diameter stenosis proximal left common carotid artery. 30% diameter stenosis proximal left subclavian artery.  Right carotid system: Patent right common carotid artery with mild plaque. Plaque right carotid bifurcation with mild narrowing distal right common carotid artery and 46% diameter stenosis proximal right internal carotid artery.  Left carotid system: 30-40% diameter stenosis proximal left common carotid artery. Soft plaque with 30% diameter narrowing mid to distal left common carotid artery. Post left carotid endarterectomy without complication noted. Mild fold at the distal clamp site with with mild narrowing.  Vertebral arteries:Mild narrowing proximal left vertebral artery. No significant narrowing right vertebral artery.  Skeleton: Mild cervical spondylotic changes.  Other neck: No worrisome  primary neck mass.  Lung apex: 4 mm noncalcified nodule right lung apex. If the patient is at high risk for bronchogenic carcinoma, follow-up chest CT at 1 year is recommended. If the patient is at low risk, no follow-up is needed. This recommendation follows the consensus statement: Guidelines for Management of Small Pulmonary Nodules Detected on CT Scans: A Statement from the [place] as published in Radiology 2005; 237:395-400.  CTA HEAD  Anterior circulation: Plaque internal carotid artery cavernous segment with mild to slightly moderate narrowing on the left and mild narrowing on the right. Otherwise anterior circulation without medium or large size vessel significant stenosis or occlusion.  Posterior circulation: Codominant vertebral arteries without significant narrowing of the distal vertebral arteries, basilar artery or major branches.  Venous sinuses: Neck  Anatomic variants: Negative  Delayed phase: Negative  IMPRESSION: CT HEAD  Subtle hypodensities within the left frontal lobe appear relatively similar to the preoperative 11/28/2014 exam without CT evidence of large acute infarct. MR would prove helpful to evaluate for small or medium size infarct.  Perivascular space left basal  ganglia incidentally noted.  CTA NECK  Post left carotid endarterectomy without complication noted. Mild fold at the distal clamp site with with mild narrowing. 30-40% diameter stenosis proximal left common carotid artery. Soft plaque with 30% diameter narrowing mid to distal left common carotid artery.  Plaque right carotid bifurcation with mild narrowing distal right common carotid artery and 46% diameter stenosis proximal right internal carotid artery.  Mild narrowing proximal left vertebral artery. No significant narrowing right vertebral artery.  CTA HEAD  Plaque internal carotid artery cavernous segment with mild to slightly moderate narrowing on the left and mild narrowing on the right. Otherwise anterior  circulation without medium or large size vessel significant stenosis or occlusion.  Codominant vertebral arteries without significant narrowing of the distal vertebral arteries, basilar artery or major branches.  4 mm noncalcified nodule right lung apex. If the patient is at high risk for bronchogenic carcinoma, follow-up chest CT at 1 year is recommended. If the patient is at low risk, no follow-up is needed. This recommendation follows the consensus statement: Guidelines for Management of Small Pulmonary Nodules Detected on CT Scans: A Statement from the [place] as published in Radiology 2005; 237:395-400.   Electronically Signed   By: Genia Del M.D.   On: 12/15/2014 16:29   Ct Head Wo Contrast  12/15/2014   CLINICAL DATA:  Right-sided weakness with facial droop and dysphasia  EXAM: CT HEAD WITHOUT CONTRAST  TECHNIQUE: Contiguous axial images were obtained from the base of the skull through the vertex without intravenous contrast.  COMPARISON:  December 10, 2014  FINDINGS: The ventricles are normal in size and configuration. There is mild superior frontal atrophy bilaterally, stable. There is local prevascular prominence in the medial left temporal lobe, stable. This area may represent a tiny arachnoid cyst. There is no other evidence suggesting potential mass. No hemorrhage, extra-axial fluid collection, or midline shift. There is mild small vessel disease in the centra semiovale bilaterally. No acute infarct evident. Both middle cerebral artery show normal attenuation. The bony calvarium appears intact. The mastoid air cells are clear.  IMPRESSION: No evidence acute infarct. Slight periventricular small vessel disease. Probable prominent perivascular space in the medial left temporal lobe. This finding is stable and benign in appearance. It may represent a small arachnoid cyst. No mass effect or hemorrhage.  Critical Value/emergent results were called by telephone at the time of interpretation  on 12/15/2014 at 1:05 pm to Dr. Milton Ferguson , who verbally acknowledged these results.   Electronically Signed   By: Lowella Grip III M.D.   On: 12/15/2014 13:05   Ct Head Wo Contrast  12/10/2014   CLINICAL DATA:  Acute left frontal headache.  EXAM: CT HEAD WITHOUT CONTRAST  TECHNIQUE: Contiguous axial images were obtained from the base of the skull through the vertex without intravenous contrast.  COMPARISON:  CT scan of November 28, 2014.  FINDINGS: Bony calvarium appears intact. 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 No mass effect or midline shift is noted. Ventricular size is within normal limits. There is no evidence of mass lesion, hemorrhage or acute infarction.  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 No acute intracranial abnormality seen.   Electronically Signed   By: Marijo Conception, M.D.   On: 12/10/2014 15:08   Ct Head Wo Contrast  11/28/2014   CLINICAL DATA:  Weakness right hand, slurred speech  EXAM: CT HEAD WITHOUT CONTRAST  TECHNIQUE: Contiguous axial images were obtained from the base of the skull through  the vertex without intravenous contrast.  COMPARISON:  03/18/2011  FINDINGS: No skull fracture is noted. Paranasal sinuses and mastoid air cells are unremarkable.  No intracranial hemorrhage, mass effect or midline shift. No definite acute cortical infarction. Stable left periventricular white matter decreased attenuation adjacent to frontal horn. Stable small lacunar infarct in left basal ganglia.  No mass lesion is noted on this unenhanced scan. There is no hydrocephalus.  IMPRESSION: No acute intracranial abnormality. No definite acute cortical infarction. Stable chronic findings as described above.   Electronically Signed   By: Lahoma Crocker M.D.   On: 11/28/2014 13:39   Ct Angio Neck W/cm &/or Wo/cm  12/15/2014   CLINICAL DATA:  59 year old diabetic hypertensive female with right facial numbness and droop. Post left endarterectomy 12/02/2014. Subsequent encounter.   EXAM: CT ANGIOGRAPHY HEAD AND NECK  TECHNIQUE: Multidetector CT imaging of the head and neck was performed using the standard protocol during bolus administration of intravenous contrast. Multiplanar CT image reconstructions and MIPs were obtained to evaluate the vascular anatomy. Carotid stenosis measurements (when applicable) are obtained utilizing NASCET criteria, using the distal internal carotid diameter as the denominator.  CONTRAST:  16mL OMNIPAQUE IOHEXOL 350 MG/ML SOLN  COMPARISON:  12/15/2014, 12/10/2014 and 11/28/2014 head CT. 12/01/2014 CT angiogram of the neck. 11/28/2014 brain MR.  FINDINGS: CT HEAD  Brain: No intracranial hemorrhage.  Subtle hypodensities within the left frontal lobe appear relatively similar to the preoperative 11/28/2014 exam without CT evidence of large acute infarct. MR would prove helpful to evaluate for small or medium size infarct.  No intracranial enhancing lesion.  No hydrocephalus.  Perivascular space left basal ganglia incidentally noted.  Calvarium and skull base: Negative.  Paranasal sinuses: Clear.  Orbits: Negative.  CTA NECK  Aortic arch: 3 vessel aortic arch with atherosclerotic type changes. 30-40% diameter stenosis proximal left common carotid artery. 30% diameter stenosis proximal left subclavian artery.  Right carotid system: Patent right common carotid artery with mild plaque. Plaque right carotid bifurcation with mild narrowing distal right common carotid artery and 46% diameter stenosis proximal right internal carotid artery.  Left carotid system: 30-40% diameter stenosis proximal left common carotid artery. Soft plaque with 30% diameter narrowing mid to distal left common carotid artery. Post left carotid endarterectomy without complication noted. Mild fold at the distal clamp site with with mild narrowing.  Vertebral arteries:Mild narrowing proximal left vertebral artery. No significant narrowing right vertebral artery.  Skeleton: Mild cervical spondylotic  changes.  Other neck: No worrisome primary neck mass.  Lung apex: 4 mm noncalcified nodule right lung apex. If the patient is at high risk for bronchogenic carcinoma, follow-up chest CT at 1 year is recommended. If the patient is at low risk, no follow-up is needed. This recommendation follows the consensus statement: Guidelines for Management of Small Pulmonary Nodules Detected on CT Scans: A Statement from the [place] as published in Radiology 2005; 237:395-400.  CTA HEAD  Anterior circulation: Plaque internal carotid artery cavernous segment with mild to slightly moderate narrowing on the left and mild narrowing on the right. Otherwise anterior circulation without medium or large size vessel significant stenosis or occlusion.  Posterior circulation: Codominant vertebral arteries without significant narrowing of the distal vertebral arteries, basilar artery or major branches.  Venous sinuses: Neck  Anatomic variants: Negative  Delayed phase: Negative  IMPRESSION: CT HEAD  Subtle hypodensities within the left frontal lobe appear relatively similar to the preoperative 11/28/2014 exam without CT evidence of large acute infarct. MR would prove  helpful to evaluate for small or medium size infarct.  Perivascular space left basal ganglia incidentally noted.  CTA NECK  Post left carotid endarterectomy without complication noted. Mild fold at the distal clamp site with with mild narrowing. 30-40% diameter stenosis proximal left common carotid artery. Soft plaque with 30% diameter narrowing mid to distal left common carotid artery.  Plaque right carotid bifurcation with mild narrowing distal right common carotid artery and 46% diameter stenosis proximal right internal carotid artery.  Mild narrowing proximal left vertebral artery. No significant narrowing right vertebral artery.  CTA HEAD  Plaque internal carotid artery cavernous segment with mild to slightly moderate narrowing on the left and mild narrowing on  the right. Otherwise anterior circulation without medium or large size vessel significant stenosis or occlusion.  Codominant vertebral arteries without significant narrowing of the distal vertebral arteries, basilar artery or major branches.  4 mm noncalcified nodule right lung apex. If the patient is at high risk for bronchogenic carcinoma, follow-up chest CT at 1 year is recommended. If the patient is at low risk, no follow-up is needed. This recommendation follows the consensus statement: Guidelines for Management of Small Pulmonary Nodules Detected on CT Scans: A Statement from the [place] as published in Radiology 2005; 237:395-400.   Electronically Signed   By: Genia Del M.D.   On: 12/15/2014 16:29   Ct Angio Neck W/cm &/or Wo/cm  12/01/2014   CLINICAL DATA:  Speech disturbance 5 days ago. New onset of bilateral hand tingling. Abnormal MRI and MRA a.  EXAM: CT ANGIOGRAPHY NECK  TECHNIQUE: Multidetector CT imaging of the neck was performed using the standard protocol during bolus administration of intravenous contrast. Multiplanar CT image reconstructions and MIPs were obtained to evaluate the vascular anatomy. Carotid stenosis measurements (when applicable) are obtained utilizing NASCET criteria, using the distal internal carotid diameter as the denominator.  CONTRAST:  47mL OMNIPAQUE IOHEXOL 350 MG/ML SOLN  COMPARISON:  MRI studies 11/28/2014.  Noninvasive study 11/29/2014.  FINDINGS: Aortic arch: Ordinary atherosclerosis of the arch without aneurysm or dissection. Branching pattern of the brachiocephalic vessels from the arch is normal. No flow-limiting origin stenosis. 30% stenosis of the proximal left common carotid artery.  Right carotid system: Common carotid artery is widely patent to the bifurcation region. There is atherosclerotic plaque of the distal 1 cm of the common carotid artery narrowing the lumen to a diameter of 4.5 mm compared to a more proximal luminal diameter of 5.8 mm.  Atherosclerotic disease at the carotid bifurcation but without stenosis or irregularity of the proximal ICA. Beyond that, the cervical internal carotid artery is normal.  Left carotid system: Common carotid artery shows atherosclerotic disease along its course with narrowing several cm proximal to the bifurcation due to soft plaque. Minimal diameter is 3 mm compared to an expected diameter of 5.8 mm. At the carotid bifurcation, there is extensive soft and calcified plaque. Proximal ICA is narrowed to a diameter of 1 mm or less. Compared to a more distal cervical ICA diameter of 4 mm, this would indicate a 75% stenosis. However, the more distal cervical ICA diameter is likely reduced because of diminished inflow. This is likely an 80- 90% or greater stenosis of the proximal left ICA. Beyond that, the cervical internal carotid artery is patent.  Vertebral arteries:Both vertebral artery origins are patent. The vertebral arteries are approximately equal in size. No stenosis of the right vertebral artery origin. 30% stenosis of the left vertebral artery origin. Beyond that, the vessels  are widely patent through the cervical region and both reach the basilar artery.  Skeleton: Ordinary spondylosis without significant lesion  Other neck: No mass or lymphadenopathy. Scans in the upper chest are unremarkable.  IMPRESSION: Non stenotic atherosclerosis of the carotid bifurcation on the right. Atherosclerosis of the distal common carotid artery with narrowing of that vessel of about 25% in that location.  Advanced atherosclerotic disease at the carotid bifurcation on the left with focal stenosis of the proximal internal carotid artery with a luminal diameter of 1 mm or less. This stenosis measures 75% when compared to the more distal cervical ICA, but the vessel is reduced in size because of diminished inflow. Therefore, the stenosis is likely 80-90% or greater. There is also a 40-50% stenosis of the left common carotid artery  3 cm proximal to the bifurcation.  30% stenosis of the left common carotid artery at its origin from the arch.  30% stenosis of the left vertebral artery origin.   Electronically Signed   By: Nelson Chimes M.D.   On: 12/01/2014 11:04   Mr Jodene Nam Head Wo Contrast  11/28/2014   CLINICAL DATA:  Two days of intermittent expressive aphasia, with RIGHT and/or LEFT hand numbness.  EXAM: MRI HEAD WITHOUT CONTRAST  MRA HEAD WITHOUT CONTRAST  TECHNIQUE: Multiplanar, multiecho pulse sequences of the brain and surrounding structures were obtained without intravenous contrast. Angiographic images of the head were obtained using MRA technique without contrast.  COMPARISON:  CT head earlier today.  FINDINGS: MRI HEAD FINDINGS  No evidence for acute infarction, hemorrhage, mass lesion, hydrocephalus, or extra-axial fluid. Normal cerebral and cerebellar volume. Mild subcortical and periventricular T2 and FLAIR hyperintensities, likely chronic microvascular ischemic change.  Pituitary, pineal, and cerebellar tonsils unremarkable. No upper cervical lesions. LEFT carotid fissure perivascular space.  Flow voids are maintained throughout the RIGHT carotid, basilar, and vertebral arteries. The LEFT carotid appears of diminished caliber. There are no areas of chronic hemorrhage.  Visualized calvarium, skull base, and upper cervical osseous structures unremarkable. Scalp and extracranial soft tissues, orbits, sinuses, and mastoids show no acute process.  MRA HEAD FINDINGS  The RIGHT internal carotid artery is widely patent in its cervical, petrous, and inferior cavernous segment. There is a 75% stenosis of the distal cavernous and supraclinoid ICA, potentially flow reducing.  Small caliber of the LEFT ICA in its cervical and petrous segments. There is concern of a severe stenosis at the LEFT ICA origin or even within the LEFT common carotid artery. Consider carotid Doppler for further evaluation.  There is severely diseased inferior  cavernous segment LEFT ICA with near complete loss of flow related enhancement. Superior cavernous segment is diseased but visible, and the supraclinoid LEFT ICA segment is small but probably otherwise normal, given the proximal flow reduction.  Basilar artery widely patent. Vertebrals codominant. No proximal stenosis of the middle, or posterior cerebral arteries. There is a proximal stenosis at the origin of the LEFT anterior cerebral artery, estimated 50-75%. The LEFT anterior and middle cerebral arteries are smaller due to diminished in flow.  Mild irregularity of the distal MCA and PCA branches suggesting intracranial atherosclerotic disease. The LEFT PICA and BILATERAL AICA vessels are poorly seen. No intracranial aneurysm.  IMPRESSION: No acute stroke is evident.  Mild small vessel disease.  Suspected flow reducing 75% stenosis of the distal cavernous and supraclinoid RIGHT ICA.  Diminished caliber of the cervical and petrous LEFT ICA, concern raised for proximal flow reducing lesion. Consider carotid Dopplers for further evaluation.  Severely diseased cavernous segment LEFT ICA in conjunction with a moderate stenosis of the proximal LEFT anterior cerebral. Potential exists for LEFT hemisphere ischemia.   Electronically Signed   By: Staci Righter M.D.   On: 11/28/2014 20:09   Mr Brain Wo Contrast  12/16/2014   CLINICAL DATA:  LEFT frontal headache, felt to represent reperfusion headache, status post LEFT carotid endarterectomy December 02, 2014. 15 minutes episode of RIGHT-sided weakness and slurred speech, RIGHT paresthesia. History of hypertension, carotid artery disease, hypercholesterolemia, diabetes.  EXAM: MRI HEAD WITHOUT CONTRAST  TECHNIQUE: Multiplanar, multiecho pulse sequences of the brain and surrounding structures were obtained without intravenous contrast.  COMPARISON:  CT head December 15, 2014 and MRI brain September 27, 2014  FINDINGS: The ventricles and sulci are normal for patient's age. No  abnormal parenchymal signal, mass lesions, mass effect. No reduced diffusion to suggest acute ischemia. No susceptibility artifact to suggest hemorrhage. LEFT inferior basal ganglia perivascular space. Minimal white matter changes, predominately in LEFT frontal lobe compatible with chronic small vessel ischemic disease, less than expected for age.  No abnormal extra-axial fluid collections. No extra-axial masses though, contrast enhanced sequences would be more sensitive. Normal major intracranial vascular flow voids seen at the skull base.  Ocular globes and orbital contents are unremarkable though not tailored for evaluation. No abnormal sellar expansion. Mild paranasal sinus mucosal thickening without air-fluid levels. The mastoid air cells are well aerated. No suspicious calvarial bone marrow signal. Craniocervical junction maintained.  IMPRESSION: No acute intracranial process.  Minimal white matter changes compatible with chronic small vessel ischemic disease, less than expected for age.   Electronically Signed   By: Elon Alas M.D.   On: 12/16/2014 02:53   Mr Brain Wo Contrast  11/28/2014   CLINICAL DATA:  Two days of intermittent expressive aphasia, with RIGHT and/or LEFT hand numbness.  EXAM: MRI HEAD WITHOUT CONTRAST  MRA HEAD WITHOUT CONTRAST  TECHNIQUE: Multiplanar, multiecho pulse sequences of the brain and surrounding structures were obtained without intravenous contrast. Angiographic images of the head were obtained using MRA technique without contrast.  COMPARISON:  CT head earlier today.  FINDINGS: MRI HEAD FINDINGS  No evidence for acute infarction, hemorrhage, mass lesion, hydrocephalus, or extra-axial fluid. Normal cerebral and cerebellar volume. Mild subcortical and periventricular T2 and FLAIR hyperintensities, likely chronic microvascular ischemic change.  Pituitary, pineal, and cerebellar tonsils unremarkable. No upper cervical lesions. LEFT carotid fissure perivascular space.  Flow  voids are maintained throughout the RIGHT carotid, basilar, and vertebral arteries. The LEFT carotid appears of diminished caliber. There are no areas of chronic hemorrhage.  Visualized calvarium, skull base, and upper cervical osseous structures unremarkable. Scalp and extracranial soft tissues, orbits, sinuses, and mastoids show no acute process.  MRA HEAD FINDINGS  The RIGHT internal carotid artery is widely patent in its cervical, petrous, and inferior cavernous segment. There is a 75% stenosis of the distal cavernous and supraclinoid ICA, potentially flow reducing.  Small caliber of the LEFT ICA in its cervical and petrous segments. There is concern of a severe stenosis at the LEFT ICA origin or even within the LEFT common carotid artery. Consider carotid Doppler for further evaluation.  There is severely diseased inferior cavernous segment LEFT ICA with near complete loss of flow related enhancement. Superior cavernous segment is diseased but visible, and the supraclinoid LEFT ICA segment is small but probably otherwise normal, given the proximal flow reduction.  Basilar artery widely patent. Vertebrals codominant. No proximal stenosis of the middle, or  posterior cerebral arteries. There is a proximal stenosis at the origin of the LEFT anterior cerebral artery, estimated 50-75%. The LEFT anterior and middle cerebral arteries are smaller due to diminished in flow.  Mild irregularity of the distal MCA and PCA branches suggesting intracranial atherosclerotic disease. The LEFT PICA and BILATERAL AICA vessels are poorly seen. No intracranial aneurysm.  IMPRESSION: No acute stroke is evident.  Mild small vessel disease.  Suspected flow reducing 75% stenosis of the distal cavernous and supraclinoid RIGHT ICA.  Diminished caliber of the cervical and petrous LEFT ICA, concern raised for proximal flow reducing lesion. Consider carotid Dopplers for further evaluation.  Severely diseased cavernous segment LEFT ICA in  conjunction with a moderate stenosis of the proximal LEFT anterior cerebral. Potential exists for LEFT hemisphere ischemia.   Electronically Signed   By: Staci Righter M.D.   On: 11/28/2014 20:09   US Carotid Bilateral  11/29/2014   CLINICAL DATA:  60 year old female with symptoms of transient ischemic attack  EXAM: BILATERAL CAROTID DUPLEX ULTRASOUND  TECHNIQUE: Pearline Cables scale imaging, color Doppler and duplex ultrasound were performed of bilateral carotid and vertebral arteries in the neck.  COMPARISON:  Brain MRI/ MRA 11/28/2014  FINDINGS: Criteria: Quantification of carotid stenosis is based on velocity parameters that correlate the residual internal carotid diameter with NASCET-based stenosis levels, using the diameter of the distal internal carotid lumen as the denominator for stenosis measurement.  The following velocity measurements were obtained:  RIGHT  ICA:  219/65 cm/sec  CCA:  Q000111Q cm/sec  SYSTOLIC ICA/CCA RATIO:  1.7  DIASTOLIC ICA/CCA RATIO:  1.8  ECA:  213 cm/sec  LEFT  ICA:  233/52 cm/sec  CCA:  123456 cm/sec  SYSTOLIC ICA/CCA RATIO:  2.8  DIASTOLIC ICA/CCA RATIO:  4.1  ECA:  Over 400 cm/sec  RIGHT CAROTID ARTERY: Heterogeneous atherosclerotic plaque beginning in the carotid bifurcation and extending into the proximal internal and external carotid arteries. Elevation of the peak systolic velocity in the external carotid arteries suggests some degree of stenosis although this does not exceed 60%. Elevation of the peak systolic velocity in the proximal internal carotid artery consistent with a 50- 69% diameter stenosis.  RIGHT VERTEBRAL ARTERY:  Patent with normal antegrade flow.  LEFT CAROTID ARTERY: Smooth heterogeneous atherosclerotic plaque in the mid common carotid artery without significant velocity elevation. More heterogeneous atherosclerotic plaque is noted in the carotid bifurcation extending into the proximal internal and external carotid arteries. Significant elevation of the peak systolic  velocity in the proximal external carotid artery consistent with a high-grade stenosis. There is associated aliasing and spectral broadening. Similarly, there is spectral broadening, aliasing and jetting in the proximal internal carotid artery. By peak systolic velocity criteria, the estimated stenosis is greater than 70%.  LEFT VERTEBRAL ARTERY:  Patent with normal antegrade flow.  IMPRESSION: 1. Moderate (50- 69%) stenosis proximal right internal carotid artery secondary to heterogeneous atherosclerotic plaque. 2. Severe (70- 99%) stenosis proximal left internal carotid artery secondary to heterogeneous atherosclerotic plaque. 3. Severe stenosis of the proximal left external carotid artery noted incidentally. 4. Smooth focal atherosclerotic plaque in the mid left common carotid artery without significant stenosis. 5. Vertebral arteries are patent with normal antegrade flow. Signed,  Criselda Peaches, MD  [facility]  [facility]   Electronically Signed   By: Jacqulynn Cadet M.D.   On: 11/29/2014 18:21     PERTINENT LAB RESULTS: CBC:  Recent Labs  12/16/14 0640 12/17/14 0631  WBC 12.3* 15.0*  HGB 10.5* 10.9*  HCT 31.6* 33.6*  PLT 282 322   CMET CMP     Component Value Date/Time   NA 134* 12/17/2014 0631   K 4.8 12/17/2014 0631   CL 99* 12/17/2014 0631   CO2 25 12/17/2014 0631   GLUCOSE 191* 12/17/2014 0631   BUN 20 12/17/2014 0631   CREATININE 0.88 12/17/2014 0631   CALCIUM 9.2 12/17/2014 0631   PROT 6.0* 12/16/2014 0640   ALBUMIN 3.5 12/16/2014 0640   AST 21 12/16/2014 0640   ALT 33 12/16/2014 0640   ALKPHOS 50 12/16/2014 0640   BILITOT 0.3 12/16/2014 0640   GFRNONAA >60 12/17/2014 0631   GFRAA >60 12/17/2014 0631    GFR Estimated Creatinine Clearance: 71 mL/min (by C-G formula based on Cr of 0.88). No results for input(s): LIPASE, AMYLASE in the last 72 hours. No results for input(s): CKTOTAL, CKMB, CKMBINDEX, TROPONINI  in the last 72 hours. Invalid input(s): POCBNP No results for input(s): DDIMER in the last 72 hours. No results for input(s): HGBA1C in the last 72 hours. No results for input(s): CHOL, HDL, LDLCALC, TRIG, CHOLHDL, LDLDIRECT in the last 72 hours. No results for input(s): TSH, T4TOTAL, T3FREE, THYROIDAB in the last 72 hours.  Invalid input(s): FREET3 No results for input(s): VITAMINB12, FOLATE, FERRITIN, TIBC, IRON, RETICCTPCT in the last 72 hours. Coags:  Recent Labs  12/17/14 0631  INR 1.08   Microbiology: No results found for this or any previous visit (from the past 240 hour(s)).   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 Neurological deficits have completely resolved. MRI brain negative for acute CVA, CTA head and neck negative for large vessel stenosis recent echocardiogram on 9/16 negative for any embolic source. TEE was initially contemplated, canceled by neurology today, recommendations are to discharge home on aspirin/Plavix, along with Topamax and as needed Ultram for headaches.  Active Problems: Hypertriglyceridemia: Most recent lipid profile on 9/16 showed triglyceride levels 967-we will now place her on fenofibrate, and temporarily discontinue statin. She needs a lipid panel checked in the next few months to see where her triglycerides are at. If LFTs continue to be stable, could consider adding statin at some point. We will defer this to her primary care practitioner   Active Problems: Lung nodule:4 mm noncalcified nodule at the right lung apex - discussed with patient in detail.Repeat CT in 23-months-1 year  Acute renal failure: Resolved, avoid NSAIDs. Continued ACE inhibitor.  Essential hypertension: Continue lisinopril-dose increased to 10 mg. Follow with PCP and adjust regimen accordingly  Leukocytosis: Etiology uncertain-UA  negative. Remains afebrile. No cough or shortness of breath-lungs clear on exam. No recent diarrhea or dysuria. Suspect this is reactive -? Recent surgery-repeat CBC as outpatient.  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 Recommendations from neurology to continue tramadol and start Topamax.  Tobacco abuse: Counseled.  Type 2 diabetes: A1c 8.6, resume metformin on discharge.   TODAY-DAY OF DISCHARGE:  Subjective:   Dillen Gubser today has no headache,no chest abdominal pain,no new weakness tingling or numbness, feels much better wants to go home today.   Objective:   Blood pressure 164/64, pulse 68, temperature 98.2 F (36.8 C), temperature source Oral, resp. rate 16, height 5\' 6"  (1.676 m), weight 74.2 kg (163 lb 9.3 oz), SpO2 97 %. No intake or output data in the 24 hours ending 12/17/14 1057 Filed Weights   12/15/14 1930  Weight: 74.2 kg (163 lb 9.3 oz)    Exam Awake Alert, Oriented *3, No new F.N deficits, Normal affect [place].AT,PERRAL Supple Neck,No JVD, No cervical lymphadenopathy appriciated.  Symmetrical Chest wall movement, Good air movement bilaterally, CTAB RRR,No Gallops,Rubs or new Murmurs, No Parasternal Heave +ve B.Sounds, Abd Soft, Non tender, No organomegaly appriciated, No rebound -guarding or rigidity. No Cyanosis, Clubbing or edema, No new Rash or bruise  DISCHARGE CONDITION: Stable  DISPOSITION: Home  DISCHARGE INSTRUCTIONS:    Activity:  As tolerated   Get Medicines reviewed and adjusted: Please take all your medications with you for your next visit with your Primary MD  Please request your Primary MD to go over all hospital tests and procedure/radiological results at the follow up, please ask your Primary MD to get all Hospital records sent to his/her office.  If you experience worsening of your admission symptoms, develop shortness of breath, life threatening emergency, suicidal or homicidal thoughts you must seek medical attention  immediately by calling 911 or calling your MD immediately  if symptoms less severe.  You must read complete instructions/literature along with all the possible adverse reactions/side effects for all the Medicines you take and that have been prescribed to you. Take any new Medicines after you have completely understood and accpet all the possible adverse reactions/side effects.   Do not drive when taking Pain medications.   Do not take more than prescribed Pain, Sleep and Anxiety Medications  Special Instructions: If you have smoked or chewed Tobacco  in the last 2 yrs please stop smoking, stop any regular Alcohol  and or any Recreational drug use.  Wear Seat belts while driving.  Please note  You were cared for by a hospitalist during your hospital stay. Once you are discharged, your primary care physician will handle any further medical issues. Please note that NO REFILLS for any discharge medications will be authorized once you are discharged, as it is imperative that you return to your primary care physician (or establish a relationship with a primary care physician if you do not have one) for your aftercare needs so that they can reassess your need for medications and monitor your lab values.  You  have a 4 mm lung nodule in the apex of the right lung. Because of a history of smoking-you will require close monitoring-please ask your primary care practitioner to repeat CT chest in 6 months-one year. In some instances this can be cancerous.   Diet recommendation: Diabetic Diet Heart Healthy diet  Discharge Instructions    Ambulatory referral to Neurology    Complete by:  As directed   Please schedule post TIA follow up in 2 months for HA management     Call MD for:  persistant dizziness or light-headedness    Complete by:  As directed      Call MD for:  severe uncontrolled pain    Complete by:  As directed      Diet - low sodium heart healthy    Complete by:  As directed      Diet  Carb Modified    Complete by:  As directed      Increase activity slowly    Complete by:  As directed            Follow-up Information    Follow up with SETHI,PRAMOD, MD In 2 months.   Specialties:  Neurology, Radiology   Why:  Office will call you with appointment date & time   Contact  information:   [address] [place] [address] [phone]       Follow up with HAWKINS,EDWARD L, MD. Schedule an appointment as soon as possible for a visit in 2 weeks.   Specialty:  Pulmonary Disease   Contact information:   [address] [place] [address] [phone]      Total Time spent on discharge equals 45 minutes.  SignedOren Binet 12/17/2014 10:57 AM

## 2014-12-17 NOTE — Care Management Note (Signed)
Case Management Note  Patient Details  Name: Grace Hess MRN: [id number] Date of Birth: [birth year]  Subjective/Objective:                    Action/Plan: Patient admitted with TIA and severe headache. Pt is from home with her spouse. Pt is being discharged home without any discharge needs.   Expected Discharge Date:                  Expected Discharge Plan:  Home/Self Care  In-House Referral:     Discharge planning Services     Post Acute Care Choice:    Choice offered to:     DME Arranged:    DME Agency:     HH Arranged:    HH Agency:     Status of Service:  In process, will continue to follow  Medicare Important Message Given:    Date Medicare IM Given:    Medicare IM give by:    Date Additional Medicare IM Given:    Additional Medicare Important Message give by:     If discussed at [place] of Stay Meetings, dates discussed:    Additional Comments:  Pollie Friar, RN 12/17/2014, 11:43 AM

## 2014-12-17 NOTE — Evaluation (Signed)
Physical Therapy Evaluation and Discharge Patient Details Name: Grace Hess MRN: [id number] DOB: [birth year] Today's Date: 12/17/2014   History of Present Illness  59 y.o. female readmitted for left frontal HA, right sided weakness, slurred speech, and right sided paresthesia felt to be due to reperfusion HA (s/p left CEA on Sept 19th, 2016) PMHx- HTN, DM, prior TIA     Clinical Impression  Patient evaluated by Physical Therapy with no further acute PT needs identified. All education has been completed and the patient has no further questions. PT is signing off. Thank you for this referral.     Follow Up Recommendations No PT follow up    Equipment Recommendations  None recommended by PT    Recommendations for Other Services       Precautions / Restrictions Precautions Precautions: None      Mobility  Bed Mobility Overal bed mobility: Independent                Transfers Overall transfer level: Independent Equipment used: None                Ambulation/Gait Ambulation/Gait assistance: Independent Ambulation Distance (Feet): 200 Feet Assistive device: None Gait Pattern/deviations: WFL(Within Functional Limits) Gait velocity: able to vary velocity up or down as instructed      Stairs            Wheelchair Mobility    Modified Rankin (Stroke Patients Only) Modified Rankin (Stroke Patients Only) Pre-Morbid Rankin Score: No symptoms Modified Rankin: No symptoms     Balance Overall balance assessment: Independent                               Standardized Balance Assessment Standardized Balance Assessment : Berg Balance Test;Dynamic Gait Index Berg Balance Test Sit to Stand: Able to stand without using hands and stabilize independently Standing Unsupported: Able to stand safely 2 minutes Sitting with Back Unsupported but Feet Supported on Floor or Stool: Able to sit safely and securely 2 minutes Stand to Sit: Sits safely  with minimal use of hands Transfers: Able to transfer safely, minor use of hands Standing Unsupported with Eyes Closed: Able to stand 10 seconds safely Standing Ubsupported with Feet Together: Able to place feet together independently and stand 1 minute safely From Standing, Reach Forward with Outstretched Arm: Can reach confidently >25 cm (10") Standing on One Leg: Able to lift leg independently and hold > 10 seconds Dynamic Gait Index Level Surface: Normal Change in Gait Speed: Normal Gait with Horizontal Head Turns: Normal Gait with Vertical Head Turns: Normal Gait and Pivot Turn: Normal Step Over Obstacle: Normal Step Around Obstacles: Normal       Pertinent Vitals/Pain Pain Assessment: No/denies pain    Home Living Family/patient expects to be discharged to:: Private residence Living Arrangements: Spouse/significant other Available Help at Discharge: Family Type of Home: House Home Access: Level entry     Home Layout: One level Home Equipment: Environmental consultant - 2 wheels;Kasandra Knudsen - single point (son using walker right now; )      Prior Function Level of Independence: Independent               Hand Dominance   Dominant Hand: Right    Extremity/Trunk Assessment   Upper Extremity Assessment: Defer to OT evaluation;Overall [facility] for tasks assessed           Lower Extremity Assessment: Overall WFL for tasks assessed (5/5 knee extension,  dorsiflexion bil; no change sensation)      Cervical / Trunk Assessment: Normal  Communication   Communication: No difficulties (longstnding lisp)  Cognition Arousal/Alertness: Awake/alert Behavior During Therapy: WFL for tasks assessed/performed Overall Cognitive Status: Within Functional Limits for tasks assessed                      General Comments General comments (skin integrity, edema, etc.): Patient with questions re: risk factors for CVA and discussed DM, smoking, controlling HTN as things to discuss with her doctor.      Exercises        Assessment/Plan    PT Assessment Patent does not need any further PT services  PT Diagnosis Difficulty walking   PT Problem List    PT Treatment Interventions     PT Goals (Current goals can be found in the Care Plan section) Acute Rehab PT Goals PT Goal Formulation: All assessment and education complete, DC therapy    Frequency     Barriers to discharge        Co-evaluation               End of Session Equipment Utilized During Treatment: Gait belt Activity Tolerance: Patient tolerated treatment well Patient left: in bed;with call bell/phone within reach;with family/visitor present           Time: [id number] PT Time Calculation (min) (ACUTE ONLY): 16 min   Charges:   PT Evaluation $Initial PT Evaluation Tier I: 1 Procedure     PT G Codes:        Adhrit Krenz 2015-01-07, 9:14 AM Pager [phone]

## 2014-12-17 NOTE — Progress Notes (Signed)
Pt discharging with husband at this time taking all personal belongings. IV's discontinued, dry dressing applied. Discharge instructions and prescriptions provided with verbal understanding. Pt aware of follow up appts. No noted distress. Pt denies pain or discomfort.

## 2014-12-17 NOTE — Discharge Instructions (Signed)
You have a 4 mm lung nodule in the apex of the right lung. Because of a history of smoking-you will require close monitoring-please ask your primary care practitioner to repeat CT chest in 6 months-one year. In some instances this can be cancerous.

## 2014-12-17 NOTE — Progress Notes (Signed)
OT Cancellation Note  Patient Details Name: Grace Hess MRN: [id number] DOB: 10-06-1955   Cancelled Treatment:    Reason Eval/Treat Not Completed: OT screened, no needs identified, will sign off. Per chart review and discussion with pt and spouse, pt is at baseline with ADLs, no OT needs identified at this time. Screening out.  Hortencia Pilar 12/17/2014, 11:09 AM

## 2014-12-20 NOTE — Progress Notes (Signed)
Physical Therapy Addendum to Evaluation for G-codes    2015/01/16 0910  PT Visit Information  Last PT Received On 16-Jan-2015  PT G-Codes **NOT FOR INPATIENT CLASS**  Functional Assessment Tool Used clinical judgement  Functional Limitation Mobility: Walking and moving around  Mobility: Walking and Moving Around Current Status [phone]) [facility]  Mobility: Walking and Moving Around Goal Status [phone]) [facility]  Mobility: Walking and Moving Around Discharge Status [phone]) [facility]   12/20/2014 Barry Brunner, PT Pager: [phone]

## 2014-12-23 ENCOUNTER — Emergency Department (HOSPITAL_COMMUNITY): Payer: 59

## 2014-12-23 ENCOUNTER — Emergency Department (HOSPITAL_COMMUNITY)
Admission: EM | Admit: 2014-12-23 | Discharge: 2014-12-23 | Disposition: A | Discharge status: Home / Self Care | Payer: 59 | Attending: Emergency Medicine | Admitting: Emergency Medicine

## 2014-12-23 ENCOUNTER — Encounter: Payer: Self-pay | Admitting: *Deleted

## 2014-12-23 ENCOUNTER — Encounter (HOSPITAL_COMMUNITY): Payer: Self-pay | Admitting: Emergency Medicine

## 2014-12-23 ENCOUNTER — Telehealth: Payer: Self-pay | Admitting: Neurology

## 2014-12-23 ENCOUNTER — Encounter: Payer: Self-pay | Admitting: Vascular Surgery

## 2014-12-23 ENCOUNTER — Other Ambulatory Visit: Payer: Self-pay | Admitting: *Deleted

## 2014-12-23 DIAGNOSIS — Z87442 Personal history of urinary calculi: Secondary | ICD-10-CM | POA: Insufficient documentation

## 2014-12-23 DIAGNOSIS — E669 Obesity, unspecified: Secondary | ICD-10-CM | POA: Diagnosis not present

## 2014-12-23 DIAGNOSIS — M6281 Muscle weakness (generalized): Secondary | ICD-10-CM | POA: Diagnosis not present

## 2014-12-23 DIAGNOSIS — Z7984 Long term (current) use of oral hypoglycemic drugs: Secondary | ICD-10-CM | POA: Diagnosis not present

## 2014-12-23 DIAGNOSIS — R29898 Other symptoms and signs involving the musculoskeletal system: Secondary | ICD-10-CM

## 2014-12-23 DIAGNOSIS — I10 Essential (primary) hypertension: Secondary | ICD-10-CM | POA: Diagnosis not present

## 2014-12-23 DIAGNOSIS — Z8619 Personal history of other infectious and parasitic diseases: Secondary | ICD-10-CM | POA: Diagnosis not present

## 2014-12-23 DIAGNOSIS — Z7902 Long term (current) use of antithrombotics/antiplatelets: Secondary | ICD-10-CM | POA: Insufficient documentation

## 2014-12-23 DIAGNOSIS — E7800 Pure hypercholesterolemia, unspecified: Secondary | ICD-10-CM | POA: Insufficient documentation

## 2014-12-23 DIAGNOSIS — K219 Gastro-esophageal reflux disease without esophagitis: Secondary | ICD-10-CM | POA: Insufficient documentation

## 2014-12-23 DIAGNOSIS — Z79899 Other long term (current) drug therapy: Secondary | ICD-10-CM | POA: Insufficient documentation

## 2014-12-23 DIAGNOSIS — R200 Anesthesia of skin: Secondary | ICD-10-CM | POA: Diagnosis present

## 2014-12-23 DIAGNOSIS — Z91040 Latex allergy status: Secondary | ICD-10-CM | POA: Diagnosis not present

## 2014-12-23 DIAGNOSIS — Z7982 Long term (current) use of aspirin: Secondary | ICD-10-CM | POA: Diagnosis not present

## 2014-12-23 DIAGNOSIS — E119 Type 2 diabetes mellitus without complications: Secondary | ICD-10-CM | POA: Diagnosis not present

## 2014-12-23 DIAGNOSIS — Z720 Tobacco use: Secondary | ICD-10-CM | POA: Insufficient documentation

## 2014-12-23 DIAGNOSIS — R011 Cardiac murmur, unspecified: Secondary | ICD-10-CM | POA: Diagnosis not present

## 2014-12-23 DIAGNOSIS — Z9889 Other specified postprocedural states: Secondary | ICD-10-CM

## 2014-12-23 DIAGNOSIS — R519 Headache, unspecified: Secondary | ICD-10-CM

## 2014-12-23 DIAGNOSIS — R51 Headache: Secondary | ICD-10-CM | POA: Diagnosis not present

## 2014-12-23 DIAGNOSIS — Z8673 Personal history of transient ischemic attack (TIA), and cerebral infarction without residual deficits: Secondary | ICD-10-CM | POA: Insufficient documentation

## 2014-12-23 LAB — CBC WITH DIFFERENTIAL/PLATELET
Basophils Absolute: 0 10*3/uL (ref 0.0–0.1)
Basophils Relative: 0 %
EOS ABS: 0.3 10*3/uL (ref 0.0–0.7)
EOS PCT: 2 %
HEMATOCRIT: 39.4 % (ref 36.0–46.0)
Hemoglobin: 13.5 g/dL (ref 12.0–15.0)
LYMPHS ABS: 4.3 10*3/uL — AB (ref 0.7–4.0)
LYMPHS PCT: 29 %
MCH: 31 pg (ref 26.0–34.0)
MCHC: 34.3 g/dL (ref 30.0–36.0)
MCV: 90.4 fL (ref 78.0–100.0)
MONO ABS: 1.1 10*3/uL — AB (ref 0.1–1.0)
Monocytes Relative: 7 %
Neutro Abs: 9.1 10*3/uL — ABNORMAL HIGH (ref 1.7–7.7)
Neutrophils Relative %: 61 %
PLATELETS: 321 10*3/uL (ref 150–400)
RBC: 4.36 MIL/uL (ref 3.87–5.11)
RDW: 13.7 % (ref 11.5–15.5)
WBC: 14.8 10*3/uL — AB (ref 4.0–10.5)

## 2014-12-23 LAB — BASIC METABOLIC PANEL
Anion gap: 9 (ref 5–15)
BUN: 19 mg/dL (ref 6–20)
CHLORIDE: 108 mmol/L (ref 101–111)
CO2: 21 mmol/L — ABNORMAL LOW (ref 22–32)
Calcium: 9.6 mg/dL (ref 8.9–10.3)
Creatinine, Ser: 1.07 mg/dL — ABNORMAL HIGH (ref 0.44–1.00)
GFR calc Af Amer: >60 mL/min (ref >60)
GFR, EST NON AFRICAN AMERICAN: 56 mL/min — AB (ref >60)
GLUCOSE: 131 mg/dL — AB (ref 65–99)
POTASSIUM: 4 mmol/L (ref 3.5–5.1)
SODIUM: 138 mmol/L (ref 135–145)

## 2014-12-23 LAB — PROTIME-INR
INR: 0.98 (ref 0.00–1.49)
Prothrombin Time: 13.2 seconds (ref 11.6–15.2)

## 2014-12-23 MED ORDER — MORPHINE SULFATE (PF) 4 MG/ML IV SOLN
4.0000 mg | Freq: Once | INTRAVENOUS | Status: AC
  Administered 2014-12-23: 4 mg via INTRAVENOUS
  Filled 2014-12-23: qty 1

## 2014-12-23 MED ORDER — ACETAMINOPHEN 500 MG PO TABS
1000.0000 mg | ORAL_TABLET | Freq: Once | ORAL | Status: AC
  Administered 2014-12-23: 1000 mg via ORAL
  Filled 2014-12-23: qty 2

## 2014-12-23 NOTE — Telephone Encounter (Signed)
Patient called to set up [facility] follow up and to advise that Dr. Leonie Man prescribed topiramate (TOPAMAX) 25 MG tablet. Patient stopped taking because Nurse who called to check up on her discovered that she's not supposed to take fenofibrate (TRICOR) 145 MG tablet and Topamax together. Please call patient to advise [phone] or [phone].

## 2014-12-23 NOTE — ED Notes (Signed)
Dr. Delo at bedside. 

## 2014-12-23 NOTE — Discharge Instructions (Signed)
Call Dr. Clydene Fake office tomorrow to arrange a follow-up appointment for this week.  Continue your medications as previously prescribed. Resume taking your Topamax.   General Headache Without Cause A headache is pain or discomfort felt around the head or neck area. The specific cause of a headache may not be found. There are many causes and types of headaches. A few common ones are:  Tension headaches.  Migraine headaches.  Cluster headaches.  Chronic daily headaches. HOME CARE INSTRUCTIONS  Watch your condition for any changes. Take these steps to help with your condition: Managing Pain  Take over-the-counter and prescription medicines only as told by your health care provider.  Lie down in a dark, quiet room when you have a headache.  If directed, apply ice to the head and neck area:  Put ice in a plastic bag.  Place a towel between your skin and the bag.  Leave the ice on for 20 minutes, 2-3 times per day.  Use a heating pad or hot shower to apply heat to the head and neck area as told by your health care provider.  Keep lights dim if bright lights bother you or make your headaches worse. Eating and Drinking  Eat meals on a regular schedule.  Limit alcohol use.  Decrease the amount of caffeine you drink, or stop drinking caffeine. General Instructions  Keep all follow-up visits as told by your health care provider. This is important.  Keep a headache journal to help find out what may trigger your headaches. For example, write down:  What you eat and drink.  How much sleep you get.  Any change to your diet or medicines.  Try massage or other relaxation techniques.  Limit stress.  Sit up straight, and do not tense your muscles.  Do not use tobacco products, including cigarettes, chewing tobacco, or e-cigarettes. If you need help quitting, ask your health care provider.  Exercise regularly as told by your health care provider.  Sleep on a regular  schedule. Get 7-9 hours of sleep, or the amount recommended by your health care provider. SEEK MEDICAL CARE IF:   Your symptoms are not helped by medicine.  You have a headache that is different from the usual headache.  You have nausea or you vomit.  You have a fever. SEEK IMMEDIATE MEDICAL CARE IF:   Your headache becomes severe.  You have repeated vomiting.  You have a stiff neck.  You have a loss of vision.  You have problems with speech.  You have pain in the eye or ear.  You have muscular weakness or loss of muscle control.  You lose your balance or have trouble walking.  You feel faint or pass out.  You have confusion.   This information is not intended to replace advice given to you by your health care provider. Make sure you discuss any questions you have with your health care provider.   Document Released: 03/01/2005 Document Revised: 11/20/2014 Document Reviewed: 06/24/2014 Elsevier Interactive Patient Education [employer].

## 2014-12-23 NOTE — ED Provider Notes (Signed)
CSN: [id number]     Arrival date & time 12/23/14  1738 History   First MD Initiated Contact with Patient 12/23/14 1746     Chief Complaint  Patient presents with  . Numbness     (Consider location/radiation/quality/duration/timing/severity/associated sxs/prior Treatment) HPI Comments: Patient is a 59 year old female with history of peripheral artery disease. She is 3 weeks status post left carotid endarterectomy. She presented here with complaints of weakness and was found to have a carotid plaque. She was discharged postoperatively and has experienced now 2 episodes where she has numbness and weakness in her right arm and right leg. She was seen one week ago and underwent MRI and other imaging studies which were negative for stroke. She was discharged to home and is currently taking Plavix. At approximately 5:00, patient reports the feeling of this numbness returned. She states she had difficulty holding onto a glass. She was also on the phone with a friend who describes her speech is becoming garbled. This has since resolved.  Patient is a 59 y.o. female presenting with extremity weakness. The history is provided by the patient.  Extremity Weakness This is a new problem. The current episode started 1 to 2 hours ago. The problem occurs constantly. The problem has been rapidly improving. Pertinent negatives include no chest pain, no headaches and no shortness of breath. Nothing aggravates the symptoms. Nothing relieves the symptoms. She has tried nothing for the symptoms. The treatment provided no relief.    Past Medical History  Diagnosis Date  . Hypercholesteremia   . Obesity   . Chest pain, unspecified   . Reflux esophagitis   . Cervicalgia   . Hypertension   . Heart murmur   . Type II diabetes mellitus ([place])   . History of hiatal hernia   . GERD (gastroesophageal reflux disease)   . Hepatitis A     "related to [employer]"  . Stroke [facility])     "they said I had a silent stroke a few  months back/MRI" (12/10/2014)  . Kidney stones     "no cysto/OR" (12/10/2014)  . TIA (transient ischemic attack) 11/28/2014    [name] 11/28/2014   Past Surgical History  Procedure Laterality Date  . Endarterectomy Left 12/02/2014    Procedure: ENDARTERECTOMY CAROTID;  Surgeon: Rosetta Posner, MD;  Location: [place];  Service: Vascular;  Laterality: Left;  . Cholecystectomy open  1978  . Vaginal hysterectomy  1991    "partial"   Family History  Problem Relation Age of Onset  . COPD Father   . Congestive Heart Failure Mother   . Cancer Brother    Social History  Substance Use Topics  . Smoking status: Current Every Day Smoker -- 1.25 packs/day for 41 years    Types: Cigarettes  . Smokeless tobacco: Never Used  . Alcohol Use: No   OB History    No data available     Review of Systems  Respiratory: Negative for shortness of breath.   Cardiovascular: Negative for chest pain.  Musculoskeletal: Positive for extremity weakness.  Neurological: Negative for headaches.  All other systems reviewed and are negative.     Allergies  Codeine; Latex; and Sulfa antibiotics  Home Medications   Prior to Admission medications   Medication Sig Start Date End Date Taking? Authorizing Provider  ALPRAZolam Duanne Moron) 1 MG tablet Take 0.5 mg by mouth 2 (two) times daily.     Historical Provider, MD  aspirin EC 81 MG tablet Take 1 tablet (81  mg total) by mouth daily. Please continue Aspirin and plavix for 6 months, then plavix daily 12/03/14   Ripudeep K Rai, MD  atorvastatin (LIPITOR) 80 MG tablet Take 80 mg by mouth daily.    Historical Provider, MD  clopidogrel (PLAVIX) 75 MG tablet Take 1 tablet (75 mg total) by mouth daily. 12/03/14   Ripudeep Krystal Eaton, MD  diphenhydrAMINE (BENADRYL) 25 MG tablet Take 0.5 tablets (12.5 mg total) by mouth every 6 (six) hours as needed. 12/12/14   Samantha J Rhyne, PA-C  fenofibrate (TRICOR) 145 MG tablet Take 1 tablet (145 mg total) by mouth daily. 12/17/14   Shanker Kristeen Mans, MD  gabapentin (NEURONTIN) 300 MG capsule Take 300 mg by mouth 3 (three) times daily.    Historical Provider, MD  lisinopril (PRINIVIL,ZESTRIL) 20 MG tablet Take 0.5 tablets (10 mg total) by mouth daily. 12/17/14   Shanker Kristeen Mans, MD  metFORMIN (GLUCOPHAGE) 1000 MG tablet Take 1 tablet (1,000 mg total) by mouth 2 (two) times daily with a meal. 12/03/14   Ripudeep K Rai, MD  pantoprazole (PROTONIX) 40 MG tablet Take 1 tablet (40 mg total) by mouth daily. 12/03/14   Ripudeep Krystal Eaton, MD  topiramate (TOPAMAX) 25 MG tablet Take 1 tablet (25 mg total) by mouth 2 (two) times daily. Patient not taking: Reported on 12/23/2014 12/17/14   Jonetta Osgood, MD  traMADol (ULTRAM) 50 MG tablet Take 1 tablet (50 mg total) by mouth every 6 (six) hours as needed for moderate pain. 12/17/14   Shanker Kristeen Mans, MD   BP 157/82 mmHg  Pulse 89  Temp(Src) 97.9 F (36.6 C) (Oral)  Resp 17  Ht 5\' 6"  (1.676 m)  Wt 142 lb (64.411 kg)  BMI 22.93 kg/m2  SpO2 97% Physical Exam  Constitutional: She is oriented to person, place, and time. She appears well-developed and well-nourished. No distress.  HENT:  Head: Normocephalic and atraumatic.  Neck: Normal range of motion. Neck supple.  Cardiovascular: Normal rate and regular rhythm.  Exam reveals no gallop and no friction rub.   No murmur heard. Pulmonary/Chest: Effort normal and breath sounds normal. No respiratory distress. She has no wheezes.  Abdominal: Soft. Bowel sounds are normal. She exhibits no distension. There is no tenderness.  Musculoskeletal: Normal range of motion.  Neurological: She is alert and oriented to person, place, and time. No cranial nerve deficit. She exhibits normal muscle tone. Coordination normal.  Strength is 5 out of 5 in all extremities. Finger to nose and heel to shin are within normal limits.  Skin: Skin is warm and dry. She is not diaphoretic.  Nursing note and vitals reviewed.   ED Course  Procedures (including critical  care time) Labs Review Labs Reviewed  BASIC METABOLIC PANEL  CBC WITH DIFFERENTIAL/PLATELET  PROTIME-INR    Imaging Review No results found. I have personally reviewed and evaluated these images and lab results as part of my medical decision-making.   EKG Interpretation   Date/Time:  Monday December 23 2014 18:08:28 EDT Ventricular Rate:  75 PR Interval:  140 QRS Duration: 75 QT Interval:  379 QTC Calculation: 423 R Axis:   34 Text Interpretation:  Sinus rhythm Low voltage, precordial leads RSR' in  V1 or V2, probably normal variant Confirmed by Legacie Dillingham  MD, Durelle Zepeda ([id number])  on 12/23/2014 6:13:35 PM      MDM   Final diagnoses:  None    Patient is a 59 year old female with history of recent TIA during which time  she was diagnosed with carotid stenosis. She underwent a carotid endarterectomy. Since that time she's been having intermittent weakness and numbness in the right side of her body along with intermittent headaches. She has had 2 MRIs since her surgery and both of these have revealed no evidence for stroke. The remainder of today's workup is essentially unremarkable and her symptoms have since resolved.  I've discussed this case with Dr. Merlene Laughter, Dr. Doy Mince, and Dr. Marin Comment.  We are all in agreement that there is no further workup is indicated. 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 She is neurologically intact and is now feeling better. She will be discharged to home with instructions to follow-up with Dr. Leonie Man from neurology to discuss her situation.  It was also brought to my attention the patient is not taking her Topamax and I will advise her to restart this as prescribed.    Veryl Speak, MD 12/23/14 2116

## 2014-12-23 NOTE — Patient Outreach (Signed)
[place] [facility]) Care Management  12/23/2014  Grace Hess 09-11-1955 [id number]   RED on EMMI Stroke Dashboard, assigned Sherrin Daisy, RN to outreach.  Thanks, Ronnell Freshwater. [place], [place] Assistant Phone: [phone] Fax: [phone]

## 2014-12-23 NOTE — ED Notes (Signed)
Patient with c/o sudden onset right arm numbness, slurred speech that started at 1700. Husband reports last known normal at 1600. C/o left eye vision change as well. States she has been sick with n/v/d for last two days. Patient is alert. dysphasia noted, but patient is understandable. No drift, grips equal, mouth symmetrical upon arrival.

## 2014-12-23 NOTE — Patient Outreach (Signed)
[facility]) Care Management  12/23/2014  Grace Hess 1955-09-04 [id number]  EMMI-stroke referral: Dashboard reports red on feeling worse overall, red on new or worsening pain/fever/ shortness of breath, red-questions /problems with meds?  Patient reports that she had virus over weekend with nausea, vomiting and diarrhea. States she has been able to keep fluids down. States she is feeling some weakness and headache today. States she has lost some weight.  Medication reviewed; patient reports that she has stop taking Topamax because it made her "feel funny"; also reports taking fenofibrate 54 mg tablet which is on the stop taking list of patient instructions.   Plan: Patient was advised to call neurologist or primary care doctor to report on her current symptoms and weight loss and to also to advise of medication that she stopped taking and to get clarification on tricor and Lipitor which she is currently taking. Advised patient to call 911 if she experiences stroke like symptoms.  Will follow up.  Sherrin Daisy, RN BSN [place] Management Coordinator [facility] Care Management  [phone]

## 2014-12-23 NOTE — ED Notes (Signed)
Made Dr Stark Jock aware of pts pain

## 2014-12-23 NOTE — ED Notes (Signed)
Patient transported to MRI 

## 2014-12-23 NOTE — ED Notes (Signed)
Pt states understanding of care given and follow up instructions 

## 2014-12-24 ENCOUNTER — Encounter: Payer: 59 | Admitting: *Deleted

## 2014-12-24 ENCOUNTER — Encounter: Payer: Self-pay | Admitting: Vascular Surgery

## 2014-12-24 ENCOUNTER — Encounter: Payer: PRIVATE HEALTH INSURANCE | Admitting: Vascular Surgery

## 2014-12-24 ENCOUNTER — Ambulatory Visit (HOSPITAL_COMMUNITY)
Admission: RE | Admit: 2014-12-24 | Discharge: 2014-12-24 | Disposition: A | Discharge status: Home / Self Care | Payer: 59 | Source: Ambulatory Visit | Attending: Vascular Surgery | Admitting: Vascular Surgery

## 2014-12-24 ENCOUNTER — Ambulatory Visit (INDEPENDENT_AMBULATORY_CARE_PROVIDER_SITE_OTHER): Payer: Self-pay | Admitting: Vascular Surgery

## 2014-12-24 VITALS — BP 95/66 | HR 93 | Temp 97.3°F | Resp 18 | Ht 66.0 in | Wt 149.5 lb

## 2014-12-24 DIAGNOSIS — G459 Transient cerebral ischemic attack, unspecified: Secondary | ICD-10-CM | POA: Insufficient documentation

## 2014-12-24 DIAGNOSIS — Z9889 Other specified postprocedural states: Secondary | ICD-10-CM | POA: Diagnosis not present

## 2014-12-24 NOTE — Progress Notes (Signed)
POST OPERATIVE OFFICE NOTE    CC:  F/u for surgery  HPI:  This is a 59 y.o. female who Initially she had presented to [facility] on 11/27/2014 with an episode of paresthesias in the right hand and expressive aphasia. A CT angiogram showed a 75% left carotid stenosis. There was no significant right carotid stenosis. The patient underwent a left carotid endarterectomy with Dacron patch angioplasty by Dr. Sherren Mocha Purva Vessell on 12/02/2014. The stenosis was noted to be 80%. The patient did well postoperatively and was discharged on postoperative day #1. In reviewing the records it appears that the blood pressure was well controlled postoperatively. Approximately 3 days after discharge the patient developed a left frontal headache. This resolved the following day but returned yesterday when she developed a severe left frontal headache. She called the office and was sent to the emergency department. Currently she states that her headache has improved although she does have a dull left frontal headache. She denies any focal weakness or paresthesias. She denies any expressive or receptive aphasia.  On HD 1, she did have a palpable radial artery pulse and neuro was intact.  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 Gabapentin was started back as a prophylactic.   Throughout hospitalization, systolic BP running mainly 130's-140's, which is acceptable per Dr. Donnetta Hutching.  She presented to [facility] last night after she was having numbness of the right side of her mouth, right arm, right arm jerking and difficulty holding onto a glass.  She was also on the phone with a friend who stated her speech was garbled.  She states that she has a continuous dull headache and everyday, this worsens with severe left frontal pain that extends down behind the eye.  She states  it does get better with pain medication.      She presents to Dr. Donnetta Hutching today for follow up and evaluation of her symptoms.    Allergies  Allergen Reactions  . Codeine Shortness Of Breath  . Latex Itching  . Sulfa Antibiotics     Childhood allergy    Current Outpatient Prescriptions  Medication Sig Dispense Refill  . acetaminophen (TYLENOL) 500 MG tablet Take 500-1,000 mg by mouth every 6 (six) hours as needed for moderate pain.    Marland Kitchen ALPRAZolam (XANAX) 1 MG tablet Take 0.5 mg by mouth 2 (two) times daily.     Marland Kitchen aspirin EC 81 MG tablet Take 1 tablet (81 mg total) by mouth daily. Please continue Aspirin and plavix for 6 months, then plavix daily 30 tablet 6  . atorvastatin (LIPITOR) 80 MG tablet Take 80 mg by mouth at bedtime.     . clopidogrel (PLAVIX) 75 MG tablet Take 1 tablet (75 mg total) by mouth daily. 30 tablet 3  . diphenhydrAMINE (BENADRYL) 25 MG tablet Take 0.5 tablets (12.5 mg total) by mouth every 6 (six) hours as needed. (Patient taking differently: Take 12.5 mg by mouth every 6 (six) hours as needed for allergies. ) 20 tablet 0  . fenofibrate (TRICOR) 145 MG tablet Take 1 tablet (145 mg total) by mouth daily. (Patient taking differently: Take 54 mg by mouth daily. ) 30 tablet 0  . gabapentin (NEURONTIN) 300 MG capsule Take 300 mg by mouth 3 (three) times daily.    Marland Kitchen lisinopril (PRINIVIL,ZESTRIL) 20 MG tablet Take 0.5 tablets (10 mg total) by  mouth daily. (Patient taking differently: Take 20 mg by mouth daily. ) 30 tablet 0  . metFORMIN (GLUCOPHAGE) 1000 MG tablet Take 1 tablet (1,000 mg total) by mouth 2 (two) times daily with a meal. 60 tablet 3  . oxyCODONE (OXY IR/ROXICODONE) 5 MG immediate release tablet Take 1 tablet by mouth every 4 (four) hours as needed for moderate pain.   0  . pantoprazole (PROTONIX) 40 MG tablet Take 1 tablet (40 mg total) by mouth daily. 30 tablet 4  . topiramate (TOPAMAX) 25 MG tablet Take 1 tablet (25 mg total) by mouth 2 (two) times daily.  (Patient taking differently: Take 25 mg by mouth 2 (two) times daily. Restarted taking today (12-24-2014).) 60 tablet 0  . traMADol (ULTRAM) 50 MG tablet Take 1 tablet (50 mg total) by mouth every 6 (six) hours as needed for moderate pain. 20 tablet 0  . ketorolac (TORADOL) 10 MG tablet Take 1 tablet by mouth daily as needed.  0   No current facility-administered medications for this visit.     ROS:  See HPI  Physical Exam:  Filed Vitals:   12/24/14 1432  BP: 95/66  Pulse: 93  Temp: 97.3 F (36.3 C)  Resp: 18    Incision:  Well healed Extremities:  5/5 BUE 5/5 BLE;  Neuro: neuro is in tact today.  Smile symmetric  Assessment/Plan:  This is a 59 y.o. female who is s/p: Left carotid endarterectomy  -pt continues to have TIA symptoms.   -her MRI last night at [facility] was negative and did not reveal any stroke -her hypertension is improved -we will perform a carotid duplex on the left side today to evaluate carotid artery s/p surgery -Her carotid duplex of the left endarterectomy site shows no evidence of technical difficulty with wide patency   Leontine Locket, PA-C [facility] [phone]  I have examined the patient, reviewed and agree with above. Long discussion with patient and her husband present. [place] [facility] postoperative course. Staged have some headache but also concerning had a right-sided weakness yesterday. Her incision looks quite good. She has no bruit in her duplex today is widely patent. Recommend continued observation only. She is to see Dr.Sethi tomorrow for neurologic follow-up  Curt Jews, MD 12/24/2014 4:04 PM

## 2014-12-24 NOTE — Telephone Encounter (Signed)
Rn call patient back about her two medication issues. Pt stated she was in the ED for some weakness 12-15-14. Pt was told to schedule a appt for follow up. Pt schedule with Dr. Jannifer Franklin for a sooner appt on 12-25-14 for the Ed visit. Pt is a patient of Dr. Leonie Man. Pt stated the nurse in the ED advise he to not take tricor and topamax together. Pt stated Dr. Leonie Man prescribed it. Rn stated a message will be forward to Dr. Leonie Man for clarification. Pts contact numbers are [phone] and [phone].

## 2014-12-24 NOTE — Telephone Encounter (Signed)
I spoke to the patient .She was supposed to be on Topamax for headaches which she is still having   hence I recommend she stay on it. TriCor was prescribed by another doctor  And I advised her to discuss it with the doctor who prescribed it. She was advised to keep the appointment in our office with Dr. Jannifer Franklin

## 2014-12-24 NOTE — Patient Outreach (Signed)
[facility] [facility]) Care Management  12/24/2014  Grace Hess 1955/12/31 [id number]  EMMI stroke follow up; Per Epic review it is noted that patient made contact with specialist office 10/10;  was seen in ED 10/10 and has several office appointment with specialists this week.  Plan: Will follow up with patient next week and follow care plan as noted.  Sherrin Daisy, RN BSN [place] Management Coordinator [facility] Care Management  [phone]

## 2014-12-24 NOTE — Patient Instructions (Signed)
°Carotid Artery Disease °The carotid arteries are the two main arteries on either side of the neck that supply blood to the brain. Carotid artery disease, also called carotid artery stenosis, is the narrowing or blockage of one or both carotid arteries. Carotid artery disease increases your risk for a stroke or a transient ischemic attack (TIA). A TIA is an episode in which a waxy, fatty substance that accumulates within the artery (plaque) blocks blood flow to the brain. A TIA is considered a "warning stroke."  °CAUSES  °· Buildup of plaque inside the carotid arteries (atherosclerosis) (common). °· A weakened outpouching in an artery (aneurysm). °· Inflammation of the carotid artery (arteritis). °· A fibrous growth within the carotid artery (fibromuscular dysplasia). °· Tissue death within the carotid artery due to radiation treatment (post-radiation necrosis). °· Decreased blood flow due to spasms of the carotid artery (vasospasm). °· Separation of the walls of the carotid artery (carotid dissection). °RISK FACTORS °· High cholesterol (dyslipidemia).   °· High blood pressure (hypertension).   °· Smoking.   °· Obesity.   °· Diabetes.   °· Family history of cardiovascular disease.   °· Inactivity or lack of regular exercise.   °· Being female. Men have an increased risk of developing atherosclerosis earlier in life than women.   °SYMPTOMS  °Carotid artery disease does not cause symptoms. °DIAGNOSIS °Diagnosis of carotid artery disease may include:  °· A physical exam. Your health care provider may hear an abnormal sound (bruit) when listening to the carotid arteries.   °· Specific tests that look at the blood flow in the carotid arteries. These tests include:   °¨ Carotid artery ultrasonography.   °¨ Carotid or cerebral angiography.   °¨ Computerized tomographic angiography (CTA).   °¨ Magnetic resonance angiography (MRA).   °TREATMENT  °Treatment of carotid artery disease can include a combination of treatments.  Treatment options include: °· Surgery. You may have:   °¨ A carotid endarterectomy. This is a surgery to remove the blockages in the carotid arteries.   °¨ A carotid angioplasty with stenting. This is a nonsurgical interventional procedure. A wire mesh (stent) is used to widen the blocked carotid arteries.   °· Medicines to control blood pressure, cholesterol, and reduce blood clotting (antiplatelet therapy).   °· Adjusting your diet.   °· Lifestyle changes such as:   °¨ Quitting smoking.   °¨ Exercising as tolerated or as directed by your health care provider.   °¨ Controlling and maintaining a good blood pressure.   °¨ Keeping cholesterol levels under control.   °HOME CARE INSTRUCTIONS  °· Take medicines only as directed by your health care provider. Make sure you understand all your medicine instructions. Do not stop your medicines without talking to your health care provider.   °· Follow your health care provider's diet instructions. It is important to eat a healthy diet that is low in saturated fats and includes plenty of fresh fruits, vegetables, and lean meats. High-fat, high-sodium foods as well as foods that are fried, overly processed, or have poor nutritional value should be avoided. °· Maintain a healthy weight.   °· Stay physically active. It is recommended that you get at least 30 minutes of activity every day.   °· Do not use any tobacco products including cigarettes, chewing tobacco, or electronic cigarettes. If you need help quitting, ask your health care provider. °· Limit alcohol use to:   °¨ No more than 2 drinks per day for men.   °¨ No more than 1 drink per day for nonpregnant women.   °· Do not use illegal drugs.   °· Keep all follow-up visits as directed by your health   care provider.   °SEEK IMMEDIATE MEDICAL CARE IF:  °You develop TIA or stroke symptoms. These include:  °· Sudden weakness or numbness on one side of the body, such as in the face, arm, or leg.   °· Sudden confusion.    °· Trouble speaking (aphasia) or understanding.   °· Sudden trouble seeing out of one or both eyes.   °· Sudden trouble walking.   °· Dizziness or feeling like you might faint.   °· Loss of balance or coordination.   °· Sudden severe headache with no known cause.   °· Sudden trouble swallowing (dysphagia).   °If you have any of these symptoms, call your local emergency services (911 in [place] Do not drive yourself to the clinic or hospital. This is a medical emergency.  °  °This information is not intended to replace advice given to you by your health care provider. Make sure you discuss any questions you have with your health care provider. °  °Document Released: 05/24/2011 Document Revised: 03/22/2014 Document Reviewed: 08/30/2012 °Elsevier Interactive Patient Education ©2016 Elsevier Inc. ° °

## 2014-12-25 ENCOUNTER — Ambulatory Visit (INDEPENDENT_AMBULATORY_CARE_PROVIDER_SITE_OTHER): Payer: 59 | Admitting: Neurology

## 2014-12-25 ENCOUNTER — Encounter: Payer: Self-pay | Admitting: Neurology

## 2014-12-25 VITALS — BP 101/68 | HR 76 | Ht 66.0 in | Wt 150.0 lb

## 2014-12-25 DIAGNOSIS — G459 Transient cerebral ischemic attack, unspecified: Secondary | ICD-10-CM

## 2014-12-25 DIAGNOSIS — R51 Headache: Secondary | ICD-10-CM

## 2014-12-25 DIAGNOSIS — R519 Headache, unspecified: Secondary | ICD-10-CM

## 2014-12-25 MED ORDER — TOPIRAMATE 25 MG PO TABS: ORAL_TABLET | ORAL | Status: DC

## 2014-12-25 NOTE — Patient Instructions (Addendum)
We will increase the topamax to one tablet in the morning and 2 in the evening.  Topamax (topiramate) is a seizure medication that has an FDA approval for seizures and for migraine headache. Potential side effects of this medication include weight loss, cognitive slowing, tingling in the fingers and toes, and carbonated drinks will taste bad. If any significant side effects are noted on this drug, please contact our office.  Stroke Prevention Some medical conditions and behaviors are associated with an increased chance of having a stroke. You may prevent a stroke by making healthy choices and managing medical conditions. HOW CAN I REDUCE MY RISK OF HAVING A STROKE?   Stay physically active. Get at least 30 minutes of activity on most or all days.  Do not smoke. It may also be helpful to avoid exposure to secondhand smoke.  Limit alcohol use. Moderate alcohol use is considered to be:  No more than 2 drinks per day for men.  No more than 1 drink per day for nonpregnant women.  Eat healthy foods. This involves:  Eating 5 or more servings of fruits and vegetables a day.  Making dietary changes that address high blood pressure (hypertension), high cholesterol, diabetes, or obesity.  Manage your cholesterol levels.  Making food choices that are high in fiber and low in saturated fat, trans fat, and cholesterol may control cholesterol levels.  Take any prescribed medicines to control cholesterol as directed by your health care provider.  Manage your diabetes.  Controlling your carbohydrate and sugar intake is recommended to manage diabetes.  Take any prescribed medicines to control diabetes as directed by your health care provider.  Control your hypertension.  Making food choices that are low in salt (sodium), saturated fat, trans fat, and cholesterol is recommended to manage hypertension.  Ask your health care provider if you need treatment to lower your blood pressure. Take any  prescribed medicines to control hypertension as directed by your health care provider.  If you are 13-39 years of age, have your blood pressure checked every 3-5 years. If you are 1 years of age or older, have your blood pressure checked every year.  Maintain a healthy weight.  Reducing calorie intake and making food choices that are low in sodium, saturated fat, trans fat, and cholesterol are recommended to manage weight.  Stop drug abuse.  Avoid taking birth control pills.  Talk to your health care provider about the risks of taking birth control pills if you are over 47 years old, smoke, get migraines, or have ever had a blood clot.  Get evaluated for sleep disorders (sleep apnea).  Talk to your health care provider about getting a sleep evaluation if you snore a lot or have excessive sleepiness.  Take medicines only as directed by your health care provider.  For some people, aspirin or blood thinners (anticoagulants) are helpful in reducing the risk of forming abnormal blood clots that can lead to stroke. If you have the irregular heart rhythm of atrial fibrillation, you should be on a blood thinner unless there is a good reason you cannot take them.  Understand all your medicine instructions.  Make sure that other conditions (such as anemia or atherosclerosis) are addressed. SEEK IMMEDIATE MEDICAL CARE IF:   You have sudden weakness or numbness of the face, arm, or leg, especially on one side of the body.  Your face or eyelid droops to one side.  You have sudden confusion.  You have trouble speaking (aphasia) or understanding.  You have sudden trouble seeing in one or both eyes.  You have sudden trouble walking.  You have dizziness.  You have a loss of balance or coordination.  You have a sudden, severe headache with no known cause.  You have new chest pain or an irregular heartbeat. Any of these symptoms may represent a serious problem that is an emergency. Do not  wait to see if the symptoms will go away. Get medical help at once. Call your local emergency services (911 in [place] Do not drive yourself to the hospital.   This information is not intended to replace advice given to you by your health care provider. Make sure you discuss any questions you have with your health care provider.   Document Released: 04/08/2004 Document Revised: 03/22/2014 Document Reviewed: 09/01/2012 Elsevier Interactive Patient Education [employer].

## 2014-12-25 NOTE — Progress Notes (Signed)
Reason for visit: TIA  Referring physician: [facility]  Grace Hess is a 59 y.o. female  History of present illness:  Grace Hess is a 59 year old right-handed white female with a history of cerebrovascular disease and a history of migraine headache. She was initially admitted to the hospital on 11/28/2014 following a TIA event referable to the left carotid artery system associated with aphasia, and right arm numbness and weakness. The patient underwent a carotid endarterectomy during that admission, and initially seemed to do fairly well. She unfortunately began having episodes of TIA type events with headache on the left frontotemporal area, and some occasional episodes of aphasia, right-sided numbness and weakness. Occasionally, she may have some episodes of some involvement of the right leg as well, she reports some occasional jerking episodes. The patient has had at least 4 separate episodes of TIA type events. She was admitted on 12/10/2014 with an event, and again on 12/15/2014. The patient on that last admission was noted to have very high blood pressures with a diastolic pressure greater than 110. The patient last had an event the evening prior to this evaluation. Episodes may last anywhere from 15-40 minutes. The patient oftentimes will have a headache associated with the event. She will have events of visual disturbance with peripheral scintillating scotoma bilaterally associated with the right-sided aphasia and numbness and weakness events. The patient has had multiple MRI evaluations of the brain that did not show evidence of a stroke. She has had CT angiogram of the head and neck that showed good perfusion of the left brain. The patient also reports some bilateral hand numbness off and on. She recently was started on Topamax for presumed migraine headache. The patient comes back to this office for an evaluation. She is on aspirin and Plavix at this time.  Past Medical History    Diagnosis Date  . Hypercholesteremia   . Obesity   . Chest pain, unspecified   . Reflux esophagitis   . Cervicalgia   . Hypertension   . Heart murmur   . Type II diabetes mellitus ([place])   . History of hiatal hernia   . GERD (gastroesophageal reflux disease)   . Hepatitis A     "related to [employer]"  . Stroke [facility])     "they said I had a silent stroke a few months back/MRI" (12/10/2014)  . Kidney stones     "no cysto/OR" (12/10/2014)  . TIA (transient ischemic attack) 11/28/2014    [name] 11/28/2014    Past Surgical History  Procedure Laterality Date  . Endarterectomy Left 12/02/2014    Procedure: ENDARTERECTOMY CAROTID;  Surgeon: Rosetta Posner, MD;  Location: [place];  Service: Vascular;  Laterality: Left;  . Cholecystectomy open  1978  . Vaginal hysterectomy  1991    "partial"    Family History  Problem Relation Age of Onset  . COPD Father   . Congestive Heart Failure Mother   . Cancer Brother     Social history:  reports that she has been smoking Cigarettes.  She has a 61.5 pack-year smoking history. She has never used smokeless tobacco. She reports that she does not drink alcohol or use illicit drugs.  Medications:  Prior to Admission medications   Medication Sig Start Date End Date Taking? Authorizing Provider  acetaminophen (TYLENOL) 500 MG tablet Take 500-1,000 mg by mouth every 6 (six) hours as needed for moderate pain.    Historical Provider, MD  ALPRAZolam Duanne Moron) 1 MG tablet  Take 0.5 mg by mouth 2 (two) times daily.     Historical Provider, MD  aspirin EC 81 MG tablet Take 1 tablet (81 mg total) by mouth daily. Please continue Aspirin and plavix for 6 months, then plavix daily 12/03/14   Ripudeep K Rai, MD  atorvastatin (LIPITOR) 80 MG tablet Take 80 mg by mouth at bedtime.     Historical Provider, MD  clopidogrel (PLAVIX) 75 MG tablet Take 1 tablet (75 mg total) by mouth daily. 12/03/14   Ripudeep Krystal Eaton, MD  diphenhydrAMINE (BENADRYL) 25 MG tablet Take 0.5 tablets  (12.5 mg total) by mouth every 6 (six) hours as needed. Patient taking differently: Take 12.5 mg by mouth every 6 (six) hours as needed for allergies.  12/12/14   Samantha J Rhyne, PA-C  fenofibrate (TRICOR) 145 MG tablet Take 1 tablet (145 mg total) by mouth daily. Patient taking differently: Take 54 mg by mouth daily.  12/17/14   Shanker Kristeen Mans, MD  gabapentin (NEURONTIN) 300 MG capsule Take 300 mg by mouth 3 (three) times daily.    Historical Provider, MD  ketorolac (TORADOL) 10 MG tablet Take 1 tablet by mouth daily as needed. 12/12/14   Historical Provider, MD  lisinopril (PRINIVIL,ZESTRIL) 20 MG tablet Take 0.5 tablets (10 mg total) by mouth daily. Patient taking differently: Take 20 mg by mouth daily.  12/17/14   Shanker Kristeen Mans, MD  metFORMIN (GLUCOPHAGE) 1000 MG tablet Take 1 tablet (1,000 mg total) by mouth 2 (two) times daily with a meal. 12/03/14   Ripudeep K Rai, MD  oxyCODONE (OXY IR/ROXICODONE) 5 MG immediate release tablet Take 1 tablet by mouth every 4 (four) hours as needed for moderate pain.  11/19/14   Historical Provider, MD  pantoprazole (PROTONIX) 40 MG tablet Take 1 tablet (40 mg total) by mouth daily. 12/03/14   Ripudeep Krystal Eaton, MD  topiramate (TOPAMAX) 25 MG tablet Take 1 tablet (25 mg total) by mouth 2 (two) times daily. Patient taking differently: Take 25 mg by mouth 2 (two) times daily. Restarted taking today (12-24-2014). 12/17/14   Shanker Kristeen Mans, MD  traMADol (ULTRAM) 50 MG tablet Take 1 tablet (50 mg total) by mouth every 6 (six) hours as needed for moderate pain. 12/17/14   Shanker Kristeen Mans, MD      Allergies  Allergen Reactions  . Codeine Shortness Of Breath  . Latex Itching  . Sulfa Antibiotics     Childhood allergy    ROS:  Out of a complete 14 system review of symptoms, the patient complains only of the following symptoms, and all other reviewed systems are negative.  Confusion, numbness, weakness, slurred speech, dizziness Anxiety, decreased  energy  Blood pressure 101/68, pulse 76, height 5\' 6"  (1.676 m), weight 150 lb (68.04 kg).  Physical Exam  General: The patient is alert and cooperative at the time of the examination.  Eyes: Pupils are equal, round, and reactive to light. Discs are flat bilaterally.  Neck: The neck is supple, no carotid bruits are noted.  Respiratory: The respiratory examination is clear.  Cardiovascular: The cardiovascular examination reveals a regular rate and rhythm, no obvious murmurs or rubs are noted.  Skin: Extremities are without significant edema.  Neurologic Exam  Mental status: The patient is alert and oriented x 3 at the time of the examination. The patient has apparent normal recent and remote memory, with an apparently normal attention span and concentration ability.  Cranial nerves: Facial symmetry is present. There is good sensation  of the face to pinprick and soft touch bilaterally. The strength of the facial muscles and the muscles to head turning and shoulder shrug are normal bilaterally. Speech is well enunciated, no aphasia or dysarthria is noted. Extraocular movements are full. Visual fields are full. The tongue is midline, and the patient has symmetric elevation of the soft palate. No obvious hearing deficits are noted.  Motor: The motor testing reveals 5 over 5 strength of all 4 extremities. Good symmetric motor tone is noted throughout.  Sensory: Sensory testing is intact to pinprick, soft touch, vibration sensation, and position sense on all 4 extremities. No evidence of extinction is noted.  Coordination: Cerebellar testing reveals good finger-nose-finger and heel-to-shin bilaterally.  Gait and station: Gait is normal. Tandem gait is unsteady. Romberg is negative. No drift is seen.  Reflexes: Deep tendon reflexes are symmetric and normal bilaterally. Toes are downgoing bilaterally.   IMAGING results 12/15/14 admission:  Ct Head Wo Contrast 12/15/2014 No evidence acute  infarct. Slight periventricular small vessel disease. Probable prominent perivascular space in the medial left temporal lobe. This finding is stable and benign in appearance. It may represent a small arachnoid cyst. No mass effect or hemorrhage.   CT HEAD  12/15/2014 Subtle hypodensities within the left frontal lobe appear relatively similar to the preoperative 11/28/2014 exam without CT evidence of large acute infarct. MR would prove helpful to evaluate for small or medium size infarct. Perivascular space left basal ganglia incidentally noted.   CTA angio NECK 12/15/2014 Post left carotid endarterectomy without complication noted. Mild fold at the distal clamp site with mild narrowing. 30-40% diameter stenosis proximal left common carotid artery. Soft plaque with 30% diameter narrowing mid to distal left common carotid artery. Plaque right carotid bifurcation with mild narrowing distal right common carotid artery and 46% diameter stenosis proximal right internal carotid artery. Mild narrowing proximal left vertebral artery. No significant narrowing right vertebral artery.   CTA Angio HEAD  12/15/2014 Plaque internal carotid artery cavernous segment with mild to slightly moderate narrowing on the left and mild narrowing on the right. Otherwise anterior circulation without medium or large size vessel significant stenosis or occlusion. Codominant vertebral arteries without significant narrowing of the distal vertebral arteries, basilar artery or major branches. 4 mm noncalcified nodule right lung apex.   MRI Brain Wo Contrast 12/16/2014 No acute intracranial process. Minimal white matter changes compatible with chronic small vessel ischemic disease, less than expected for age.   * MRI scan images were reviewed online. I agree with the written report.    Assessment/Plan:  1. Cerebrovascular disease, left carotid endarterectomy  2. History of migraine headache  3. Episodic TIA  like events  The patient has an unusual history of TIA type events following the carotid endarterectomy referable to the left carotid artery distribution. She is having migraine-like symptoms with these events at times with bilateral scintillating scotoma in the peripheral vision. The patient is having some variations in blood pressure, at times low, at other times quite high. I have indicated that she needs to check her blood pressure multiple times during the day, and try to keep her blood pressure on the lower side. The patient will continue the aspirin and Plavix. We will go up on the Topamax taking 25 mg in the morning, 50 mg in the evening, and we will check an EEG study given the history of jerking of the right leg at times. The patient follow-up in 4 weeks.  Jill Alexanders MD 12/25/2014 2:03  PM  [facility] [address] [place], [place] [address]  Phone [phone] Fax [phone]

## 2014-12-25 NOTE — Addendum Note (Signed)
Addended by: Dorthula Rue L on: 12/25/2014 07:57 AM   Modules accepted: Orders

## 2014-12-27 ENCOUNTER — Other Ambulatory Visit: Payer: Self-pay | Admitting: *Deleted

## 2014-12-27 NOTE — Patient Outreach (Signed)
[place] [facility]) Care Management  12/27/2014  Grace Hess 12-07-1955 [id number]   RED on [place] Stroke Dashboard, notification sent to Sherrin Daisy, RN.  Thanks, Ronnell Freshwater. [place], [place] Assistant Phone: [phone] Fax: [phone]

## 2014-12-27 NOTE — Patient Outreach (Signed)
[place] [facility]) Care Management  12/27/2014  Grace Hess 22-Jul-1955 [id number]  EMMI-Stroke call; dashboard red for feeling worse overall., questions/problems with medicines and smoked or been around smoke (12/26/2014).  Telephone call to patient x2;  left message on voice mail requesting call back.  Plan: Will follow up. Sherrin Daisy, RN BSN [place] Management Coordinator [facility] Care Management  [phone]

## 2014-12-30 ENCOUNTER — Other Ambulatory Visit: Payer: 59 | Admitting: *Deleted

## 2014-12-30 NOTE — Patient Outreach (Signed)
[employer] [facility]) Care Management  12/30/2014  Grace Hess 1955-10-12 [id number]  EMMI-stroke follow up: Telephone call to patient x 2 calls; no answer & unable to leave message.  Plan: Will follow up. Sherrin Daisy, RN BSN [place] Management Coordinator [facility] Care Management  [phone]

## 2014-12-31 ENCOUNTER — Other Ambulatory Visit: Payer: 59 | Admitting: *Deleted

## 2014-12-31 NOTE — Patient Outreach (Signed)
[place] [facility]) Care Management  12/31/2014  Grace Hess Sep 26, 1955 [id number]   Telephone call to patient; niece answered call and advised that patient is not available. States better to call in AM.  Plan: will follow up with call.   Sherrin Daisy, RN BSN [place] Management Coordinator [facility] Care Management  [phone]

## 2015-01-01 ENCOUNTER — Encounter: Payer: Self-pay | Admitting: Acute Care

## 2015-01-01 ENCOUNTER — Telehealth: Payer: Self-pay | Admitting: Acute Care

## 2015-01-01 ENCOUNTER — Other Ambulatory Visit: Payer: Self-pay | Admitting: *Deleted

## 2015-01-01 NOTE — Patient Outreach (Signed)
[place] [facility]) Care Management  01/01/2015  Grace Hess 25-Mar-1955 [id number]  EMMI-stroke follow up:  Subjective: Telephone call to patient. HIPPA verification received.  Patient voices that she has seen primary care provider, neurologist and vascular surgeon for follow up after hospital stay. States today has dull left sided headache. States she is taking medications as prescribed and is taking Topamax. Voices understanding of reporting  reactions of medications to MD. States she is not having stroke like symptoms today. States she is walking without assistance of walker or cane. States she gets out but is restricted from driving. States no problems getting where she needs to go because family provides transportation.   Objectives.:  See completed health assessments as noted. Medication review noted.  Assessment: Pt voices understanding of medication compliance. Patient has been compliant with keeping doctors' appointments. Currently not ready to stop smoking and does not wish to have smoking cessation educational materials sent to her.  Plan: Will follow up with patient. Patient agrees to set appointment. Update care plan as noted.   Sherrin Daisy, RN BSN [place] Management Coordinator [facility] Care Management  [phone]

## 2015-01-01 NOTE — Telephone Encounter (Signed)
I called to schedule lung screening at the referral of Dr. Sinda Du. There was no answer and the answering machine stated that the memory was full, and there was no option to leave a message. We will continue to attempt  to make contact with this patient and get her scheduled for a screening.Marland Kitchen

## 2015-01-02 NOTE — Telephone Encounter (Signed)
Called patient. There was no answer and not able to leave message. Will call back later.

## 2015-01-08 ENCOUNTER — Ambulatory Visit: Payer: 59 | Admitting: *Deleted

## 2015-01-09 ENCOUNTER — Other Ambulatory Visit: Payer: Self-pay | Admitting: *Deleted

## 2015-01-09 NOTE — Patient Outreach (Signed)
[place] [facility]) Care Management  01/09/2015  MICHAELENE CERAR [birth year] [id number]  EMMI-Stroke follow up call:  Subjective:  Patient voices that she had excessive diarrhea over the weekend so she stopped taking Topamax. States she talked with doctor via telephone and advised of symptoms and was advised to discontinue medication. Does not remember name of doctor who was on call or specialty.  States she only has minimal dull headache and can take pain medication if needed. Patient states taking other medication as prescribed. Voices understanding of medication compliance.  States has EEG scheduled in November and follow up appointment with neurologist. Patient states she has not had any stroke symptoms. States no emergency visits or hospital admission since our last conversation. States has not had to use assistance to walk. States continues to have support from family as needed. Patient voices concern over adult child with chronic medical condition who she stays with during the day.  States on her way to his home now.  Objective: See medication list as noted. See assessments as noted.  Assessment:  Not taking Topamax due to side effect which patient reported to doctor. Patient reports no stroke symptoms since last contact. Patient voices understanding of action plan if stroke symptoms occur.  Plan: Continue to follow care plan as noted. Will follow up next week. Patient in agreement with set appointment.  Sherrin Daisy, RN BSN [place] Management Coordinator [facility] Care Management  [phone]

## 2015-01-10 ENCOUNTER — Telehealth: Payer: Self-pay | Admitting: Neurology

## 2015-01-10 NOTE — Telephone Encounter (Signed)
01/10/15 spoke to patient-EEG has been referred to [place] due to tech being out.  [place] to contact patient

## 2015-01-10 NOTE — Telephone Encounter (Signed)
Left Message to make Appointment x3 Sent letter  Dear Grace Hess, We have attempted to call you several times to schedule the lung screening Dr. Luan Pulling requested you have performed. We have been unable to contact you by phone. Please call the number below at your earliest convenience so that we can get you scheduled for your screening. We look forward to participating in your care.  Thank you,  [facility] Cancer Screening [facility] [phone]

## 2015-01-14 ENCOUNTER — Telehealth: Payer: Self-pay | Admitting: Neurology

## 2015-01-14 NOTE — Telephone Encounter (Signed)
I spoke to the patient. It appears that the patient saw Dr. Jannifer Franklin most recently 2 weeks ago and he increased the Topamax . And she has developed diarrhea. She stopped Topamax 2 days ago and still feels uneasy in the stomach. I recommended she stay off the Topamax and she has an upcoming appointment with Dr. Jannifer Franklin in 1 week. She was advised to discuss headache management with him at that visit

## 2015-01-14 NOTE — Telephone Encounter (Signed)
Patient called to advise she stopped taking topiramate (TOPAMAX) 25 MG tablet, medication was not agreeing with her. She was having bouts of diarrhea. Patient states she needs something else.

## 2015-01-14 NOTE — Telephone Encounter (Signed)
Rn return patients phone call about the topamax causing her to have diarrhea. Pt is seeing Dr. Eugenie Birks for stroke follow up. Dr.Sethi prescribed medications. Pt stated she could not tolerate the medication. Rn stated Dr.Sethi will be notified of this medication giving her side effects. Pt wants Dr.Sethi to call her cell phone at [phone].

## 2015-01-16 ENCOUNTER — Other Ambulatory Visit: Payer: Self-pay | Admitting: Acute Care

## 2015-01-16 ENCOUNTER — Other Ambulatory Visit: Payer: 59 | Admitting: *Deleted

## 2015-01-16 DIAGNOSIS — F17210 Nicotine dependence, cigarettes, uncomplicated: Secondary | ICD-10-CM

## 2015-01-16 NOTE — Patient Outreach (Signed)
[place] [facility]) Care Management  01/16/2015  Grace Hess 10/17/1955 [id number]  Emmi-Stroke follow up;  Telephone call to patient; left message on voice mail requesting return call.  Plan: Will follow up.  Sherrin Daisy, RN BSN [place] Management Coordinator [facility] Care Management  [phone]

## 2015-01-20 ENCOUNTER — Other Ambulatory Visit: Payer: 59

## 2015-01-20 ENCOUNTER — Telehealth: Payer: Self-pay | Admitting: Neurology

## 2015-01-20 NOTE — Telephone Encounter (Signed)
Per [name], Grace Hess called at 9:28 am today to cancel her EEG scheduled for 3pm today. She did not want to reschedule and will call when she does. Laureen Ochs

## 2015-01-21 ENCOUNTER — Inpatient Hospital Stay: Admission: RE | Admit: 2015-01-21 | Payer: 59 | Source: Ambulatory Visit

## 2015-01-21 ENCOUNTER — Encounter: Payer: 59 | Admitting: Acute Care

## 2015-01-21 ENCOUNTER — Telehealth: Payer: Self-pay | Admitting: Acute Care

## 2015-01-21 NOTE — Telephone Encounter (Signed)
I called to let patient know she had missed an appointment for today at 9 am. She has had some changes since we scheduled the appointment, she now has custody of her granddaughter, and was unable to make the appointment, but did not call and cancel. I tried to reschedule the appointment, but she was unable to decide on a time. She told me she will call me to reschedule when her situation is more manageable. I reminded her to call when she can, because getting screened is important.She verbalized understanding, and said she will call. She has my contact information.

## 2015-01-22 ENCOUNTER — Ambulatory Visit: Payer: Self-pay | Admitting: Neurology

## 2015-01-23 ENCOUNTER — Other Ambulatory Visit: Payer: 59 | Admitting: *Deleted

## 2015-01-23 NOTE — Patient Outreach (Signed)
[place] [facility]) Care Management  01/23/2015  Grace Hess [birth year] [id number]   Follow up EMMI-stroke: Telephone call to patient; left message on voice mail requesting call back.  Plan: Will follow up. Sherrin Daisy, RN BSN [place] Management Coordinator [facility] Care Management  [phone]

## 2015-01-27 ENCOUNTER — Ambulatory Visit: Payer: Self-pay | Admitting: Neurology

## 2015-01-30 ENCOUNTER — Other Ambulatory Visit: Payer: 59 | Admitting: *Deleted

## 2015-01-30 NOTE — Patient Outreach (Signed)
[place] [facility]) Care Management  01/30/2015  TEMPY ALCINDOR Jun 07, 1955  [id number]  Emmi-Stroke follow up: Telephone call to patient; left message on voice mail requesting call back.  Plan: will follow up Sherrin Daisy, RN BSN [place] Management Coordinator [facility] Care Management  [phone]

## 2015-01-31 ENCOUNTER — Encounter: Payer: Self-pay | Admitting: Acute Care

## 2015-01-31 DIAGNOSIS — F17210 Nicotine dependence, cigarettes, uncomplicated: Secondary | ICD-10-CM | POA: Insufficient documentation

## 2015-02-03 ENCOUNTER — Ambulatory Visit: Payer: Self-pay | Admitting: Neurology

## 2015-02-03 ENCOUNTER — Other Ambulatory Visit: Payer: 59 | Admitting: *Deleted

## 2015-02-05 ENCOUNTER — Other Ambulatory Visit: Payer: Self-pay | Admitting: *Deleted

## 2015-02-05 NOTE — Patient Outreach (Signed)
[facility]) Care Management  02/05/2015  Grace Hess 1955/07/16 [id number]   No response from patient after 3 consecutive follow call attempts.  Care plan goals have been met, see as noted.   Plan: Close out case.  Send to care management assistant to close. Sherrin Daisy, RN BSN [place] Management Coordinator [facility] Care Management  [phone]

## 2015-02-11 ENCOUNTER — Ambulatory Visit (HOSPITAL_COMMUNITY)
Admission: RE | Admit: 2015-02-11 | Discharge: 2015-02-11 | Disposition: A | Discharge status: Home / Self Care | Payer: 59 | Source: Ambulatory Visit | Attending: Acute Care | Admitting: Acute Care

## 2015-02-11 ENCOUNTER — Encounter: Payer: Self-pay | Admitting: Acute Care

## 2015-02-11 ENCOUNTER — Ambulatory Visit (INDEPENDENT_AMBULATORY_CARE_PROVIDER_SITE_OTHER): Payer: 59 | Admitting: Acute Care

## 2015-02-11 DIAGNOSIS — Z87891 Personal history of nicotine dependence: Secondary | ICD-10-CM | POA: Insufficient documentation

## 2015-02-11 DIAGNOSIS — F17210 Nicotine dependence, cigarettes, uncomplicated: Secondary | ICD-10-CM

## 2015-02-11 DIAGNOSIS — Z122 Encounter for screening for malignant neoplasm of respiratory organs: Secondary | ICD-10-CM | POA: Diagnosis not present

## 2015-02-11 NOTE — Progress Notes (Signed)
Shared Decision Making Visit Lung Cancer Screening Program [phone])   Eligibility:  Age 59 y.o.  Pack Years Smoking History Calculation : 82 pack years (# packs/per year x # years smoked)  Recent History of coughing up blood  no  Unexplained weight loss? no ( >Than 15 pounds within the last 6 months )  Prior History Lung / other cancer no (Diagnosis within the last 5 years already requiring surveillance chest CT Scans).  Smoking Status Current Smoker Former Smokers: Years since quit: NA  Quit Date: NA  Visit Components:  Discussion included one or more decision making aids. yes  Discussion included risk/benefits of screening. yes  Discussion included potential follow up diagnostic testing for abnormal scans. yes  Discussion included meaning and risk of over diagnosis. yes  Discussion included meaning and risk of False Positives. yes  Discussion included meaning of total radiation exposure. yes  Counseling Included:  Importance of adherence to annual lung cancer LDCT screening. yes  Impact of comorbidities on ability to participate in the program. yes  Ability and willingness to under diagnostic treatment. yes  Smoking Cessation Counseling:  Current Smokers:   Discussed importance of smoking cessation. yes  Information about tobacco cessation classes and interventions provided to patient. yes  Patient provided with "ticket" for LDCT Scan. no  Symptomatic Patient. no  Counseling: NA  Diagnosis Code: Tobacco Use Z72.0  Asymptomatic Patient yes  Counseling (Intermediate counseling: > three minutes counseling) [id number]  Former Smokers:   Discussed the importance of maintaining cigarette abstinence. NA, current smoker  Diagnosis Code: Personal History of Nicotine Dependence. [id number]  Information about tobacco cessation classes and interventions provided to patient.NA, current smoker  Patient provided with "ticket" for LDCT Scan. yes  Written Order for  Lung Cancer Screening with LDCT placed in Epic. Yes (CT Chest Lung Cancer Screening Low Dose W/O CM) [id number] Z12.2-Screening of respiratory organs Z87.891-Personal history of nicotine dependence  I spent 15 minutes of face to face time with Ms. Cadwell discussing the risks and benefits of lung cancer screening.We viewed a power point together that explained the above noted topics in detail, pausing at intervals to allow for questions to be asked and answered to ensure understanding.We discussed that the single most powerful action that she can take to decrease her risk of developing Lung Cancer is to quit smoking. She has tried Chantix in the past, and had to stop taking it due to very vivid dreams.She is interested in quitting, but is not ready to set a quit date today. She has just had to take custody of her 66 month old granddaughter, and her adult son is sick. She feels like with the current life stressors she is experiencing she will be unable to quit successfully. I have given her my card and contact information and asked her to call me when she is ready to quit so that I can make sure she has the tools and resources she needs to be successful.She said she would do this. We discussed Quit Smart Classes,nicotine replacement therapy, non-nicotine medications support groups and behavior modification. I also gave her the " Be stronger than your excuses " card with information about resources in the community available to support her through her  quitting journey. We also discussed setting small goals each day to smoke 1-2 less cigarettes per day until she is ready to set a quit date, as setting these small goals and achieving them is a good source of positive reinforcement. We discussed the time  and location of her scan, and that I will call with her results within 24-48 hours of receiving them. I have given her a copy of the power point we viewed together to refer to in the future if she has any additional  questions, and my card and contact information in the event she is ready to quit smoking.   Magdalen Spatz, NP

## 2015-02-12 ENCOUNTER — Telehealth: Payer: Self-pay | Admitting: Neurology

## 2015-02-12 NOTE — Telephone Encounter (Signed)
The patient had a carotid enterectomy in the midportion of September 2016. She is on aspirin and Plavix. I recommended waiting 90 days prior to adding the dental procedure done. She will need to be off the medication for at least 6 days. I discussed this with her.

## 2015-02-12 NOTE — Telephone Encounter (Signed)
Patient called to advise she is having dental extractions and needs to know how many days to be off clopidogrel (PLAVIX) 75 MG tablet prior to this.

## 2015-06-04 ENCOUNTER — Other Ambulatory Visit: Payer: Self-pay | Admitting: Acute Care

## 2015-06-04 DIAGNOSIS — F17210 Nicotine dependence, cigarettes, uncomplicated: Secondary | ICD-10-CM

## 2015-06-20 ENCOUNTER — Encounter: Payer: Self-pay | Admitting: Vascular Surgery

## 2015-07-01 ENCOUNTER — Ambulatory Visit: Payer: 59 | Admitting: Vascular Surgery

## 2015-07-01 ENCOUNTER — Ambulatory Visit (HOSPITAL_COMMUNITY): Payer: 59

## 2015-07-07 ENCOUNTER — Other Ambulatory Visit (INDEPENDENT_AMBULATORY_CARE_PROVIDER_SITE_OTHER): Payer: Self-pay | Admitting: *Deleted

## 2015-07-07 ENCOUNTER — Telehealth (INDEPENDENT_AMBULATORY_CARE_PROVIDER_SITE_OTHER): Payer: Self-pay | Admitting: *Deleted

## 2015-07-07 MED ORDER — DICYCLOMINE HCL 10 MG PO CAPS: 10.0000 mg | ORAL_CAPSULE | Freq: Two times a day (BID) | ORAL | Status: DC | PRN

## 2015-07-07 NOTE — Telephone Encounter (Signed)
Grace Hess states that she has had diarrhea the last 4 days,and her indigestion has increased. She had a bottle of Dicyclomine but has lost it , she is reqesting anotherr refill on this. To be discussed with Dr.Rehman,then patient will be notified.

## 2015-07-07 NOTE — Telephone Encounter (Signed)
For her nausea he prescribed Zofran 4 mg - take 1 by mouth bid prn #20 no refills. Called to [facility] in [place]. If patient feels no better she should have OV with Terri this week. Patient has bee given an appointment with Terri for 07/08/2015 @10 :15 am. Dr.Rehman will walk over during OV of after.

## 2015-07-07 NOTE — Telephone Encounter (Signed)
Patient sent a message requesting a refill on this medication. Dr.Rehman approved. Patient may also take Imodium over the counter up to 3 times daily. If symptoms persist , patient will need to be seen.

## 2015-07-07 NOTE — Telephone Encounter (Signed)
Dr.Rehman states that we may call in Bentyl. This was e-scribed. Patient was called and made aware and she now states that she has been nauseated for 2 days no vomiting. To review with Dr.Rehman.Patient will be made are of his recommendation.

## 2015-07-08 ENCOUNTER — Encounter (INDEPENDENT_AMBULATORY_CARE_PROVIDER_SITE_OTHER): Payer: Self-pay | Admitting: Internal Medicine

## 2015-07-08 ENCOUNTER — Ambulatory Visit (INDEPENDENT_AMBULATORY_CARE_PROVIDER_SITE_OTHER): Payer: 59 | Admitting: Internal Medicine

## 2015-07-08 VITALS — BP 104/50 | HR 64 | Temp 98.1°F | Ht 60.0 in | Wt 150.0 lb

## 2015-07-08 DIAGNOSIS — R110 Nausea: Secondary | ICD-10-CM | POA: Diagnosis not present

## 2015-07-08 DIAGNOSIS — K219 Gastro-esophageal reflux disease without esophagitis: Secondary | ICD-10-CM

## 2015-07-08 DIAGNOSIS — K589 Irritable bowel syndrome without diarrhea: Secondary | ICD-10-CM | POA: Diagnosis not present

## 2015-07-08 NOTE — Patient Instructions (Signed)
Continue the Dexilant. May use zantac at night. IBS. Continue the Dicyclomine. Nausea has resolved. OV in 6 months.

## 2015-07-08 NOTE — Progress Notes (Signed)
Subjective:    Patient ID: Grace Hess, female    DOB: June 22, 1955, 60 y.o.   MRN: [id number]  HPI  Here today with c/o of nausea. She says she had abdominal pain yesterday and indigestion. She also had some nausea. Rx for Dicyclomine and Zofran called to her pharmacy by Dr. Laural Golden. She says she is 50% better. The nausea has resolved. She did eat breakfast this morning and drank coffee. She had a BM x 2 today.  Stools are formed but may be loose.  She says the Dicyclomine helped with the cramps. There was no fever associated with her symptoms.  Appetite is okay now. She has had 15 pound weight loss since 2013 due to her brother dying. Her last colonoscopy was in 2010 (screening) and was normal except for a single diverticulum at the proximal sigmoid colon.    Review of Systems Past Medical History  Diagnosis Date  . Hypercholesteremia   . Obesity   . Chest pain, unspecified   . Reflux esophagitis   . Cervicalgia   . Hypertension   . Heart murmur   . Type II diabetes mellitus ([place])   . History of hiatal hernia   . GERD (gastroesophageal reflux disease)   . Hepatitis A     "related to [employer]"  . Stroke [facility])     "they said I had a silent stroke a few months back/MRI" (12/10/2014)  . Kidney stones     "no cysto/OR" (12/10/2014)  . TIA (transient ischemic attack) 11/28/2014    [name] 11/28/2014  . Heavy smoker (more than 20 cigarettes per day)     Scheduled for LDCT screening 02/11/15    Past Surgical History  Procedure Laterality Date  . Endarterectomy Left 12/02/2014    Procedure: ENDARTERECTOMY CAROTID;  Surgeon: Rosetta Posner, MD;  Location: [facility];  Service: Vascular;  Laterality: Left;  . Cholecystectomy open  1978  . Vaginal hysterectomy  1991    "partial"    Allergies  Allergen Reactions  . Codeine Shortness Of Breath  . Latex Itching  . Sulfa Antibiotics     Childhood allergy    Current Outpatient Prescriptions on File Prior to Visit  Medication Sig  Dispense Refill  . acetaminophen (TYLENOL) 500 MG tablet Take 500-1,000 mg by mouth every 6 (six) hours as needed for moderate pain.    Marland Kitchen ALPRAZolam (XANAX) 1 MG tablet Take 0.5 mg by mouth 2 (two) times daily.     Marland Kitchen aspirin EC 81 MG tablet Take 1 tablet (81 mg total) by mouth daily. Please continue Aspirin and plavix for 6 months, then plavix daily 30 tablet 6  . atorvastatin (LIPITOR) 80 MG tablet Take 80 mg by mouth at bedtime.     . clopidogrel (PLAVIX) 75 MG tablet Take 1 tablet (75 mg total) by mouth daily. 30 tablet 3  . dicyclomine (BENTYL) 10 MG capsule Take 1 capsule (10 mg total) by mouth 2 (two) times daily as needed for spasms. 90 capsule 3  . fenofibrate (TRICOR) 145 MG tablet Take 1 tablet (145 mg total) by mouth daily. (Patient taking differently: Take 54 mg by mouth daily. ) 30 tablet 0  . gabapentin (NEURONTIN) 300 MG capsule Take 300 mg by mouth 3 (three) times daily.    Marland Kitchen ketorolac (TORADOL) 10 MG tablet Take 1 tablet by mouth daily as needed.  0  . lisinopril (PRINIVIL,ZESTRIL) 20 MG tablet Take 0.5 tablets (10 mg total) by mouth daily. (Patient taking  differently: Take 20 mg by mouth daily. ) 30 tablet 0  . metFORMIN (GLUCOPHAGE) 1000 MG tablet Take 1 tablet (1,000 mg total) by mouth 2 (two) times daily with a meal. 60 tablet 3  . oxyCODONE (OXY IR/ROXICODONE) 5 MG immediate release tablet Take 1 tablet by mouth every 4 (four) hours as needed for moderate pain.   0  . topiramate (TOPAMAX) 25 MG tablet One tablet in the morning and 2 in the evening (Patient not taking: Reported on 01/09/2015) 90 tablet 1  . traMADol (ULTRAM) 50 MG tablet Take 1 tablet (50 mg total) by mouth every 6 (six) hours as needed for moderate pain. (Patient not taking: Reported on 07/08/2015) 20 tablet 0   No current facility-administered medications on file prior to visit.        Objective:   Physical Exam Blood pressure 104/50, pulse 64, temperature 98.1 F (36.7 C), height 5' (1.524 m), weight  150 lb (68.04 kg). Alert and oriented. Skin warm and dry. Oral mucosa is moist.   . Sclera anicteric, conjunctivae is pink. Thyroid not enlarged. No cervical lymphadenopathy. Lungs clear. Heart regular rate and rhythm.  Abdomen is soft. Bowel sounds are positive. No hepatomegaly. No abdominal masses felt. No tenderness.  No edema to lower extremities.         Assessment & Plan:  GERD: She is taking Dexilant for this.  May take a Zantac at night.  Nausea which has now resolved. IBS: Continue the Dicyclomine.  OV 6 months.

## 2015-09-14 ENCOUNTER — Emergency Department (HOSPITAL_COMMUNITY): Payer: BLUE CROSS/BLUE SHIELD

## 2015-09-14 ENCOUNTER — Emergency Department (HOSPITAL_COMMUNITY)
Admission: EM | Admit: 2015-09-14 | Discharge: 2015-09-14 | Discharge status: Against Medical Advice | Payer: BLUE CROSS/BLUE SHIELD | Attending: Emergency Medicine | Admitting: Emergency Medicine

## 2015-09-14 ENCOUNTER — Encounter (HOSPITAL_COMMUNITY): Payer: Self-pay | Admitting: Emergency Medicine

## 2015-09-14 DIAGNOSIS — R0789 Other chest pain: Secondary | ICD-10-CM | POA: Diagnosis not present

## 2015-09-14 DIAGNOSIS — F17210 Nicotine dependence, cigarettes, uncomplicated: Secondary | ICD-10-CM | POA: Diagnosis not present

## 2015-09-14 DIAGNOSIS — I10 Essential (primary) hypertension: Secondary | ICD-10-CM | POA: Diagnosis not present

## 2015-09-14 DIAGNOSIS — Z7982 Long term (current) use of aspirin: Secondary | ICD-10-CM | POA: Insufficient documentation

## 2015-09-14 DIAGNOSIS — Z7984 Long term (current) use of oral hypoglycemic drugs: Secondary | ICD-10-CM | POA: Insufficient documentation

## 2015-09-14 DIAGNOSIS — R079 Chest pain, unspecified: Secondary | ICD-10-CM | POA: Insufficient documentation

## 2015-09-14 DIAGNOSIS — Z8673 Personal history of transient ischemic attack (TIA), and cerebral infarction without residual deficits: Secondary | ICD-10-CM | POA: Diagnosis not present

## 2015-09-14 DIAGNOSIS — E119 Type 2 diabetes mellitus without complications: Secondary | ICD-10-CM | POA: Insufficient documentation

## 2015-09-14 LAB — COMPREHENSIVE METABOLIC PANEL
ALT: 24 U/L (ref 14–54)
ANION GAP: 10 (ref 5–15)
AST: 22 U/L (ref 15–41)
Albumin: 4.2 g/dL (ref 3.5–5.0)
Alkaline Phosphatase: 58 U/L (ref 38–126)
BUN: 35 mg/dL — AB (ref 6–20)
CALCIUM: 8.6 mg/dL — AB (ref 8.9–10.3)
CO2: 25 mmol/L (ref 22–32)
CREATININE: 1.14 mg/dL — AB (ref 0.44–1.00)
Chloride: 103 mmol/L (ref 101–111)
GFR, EST AFRICAN AMERICAN: 59 mL/min — AB (ref >60)
GFR, EST NON AFRICAN AMERICAN: 51 mL/min — AB (ref >60)
Glucose, Bld: 120 mg/dL — ABNORMAL HIGH (ref 65–99)
POTASSIUM: 4 mmol/L (ref 3.5–5.1)
Sodium: 138 mmol/L (ref 135–145)
Total Bilirubin: 0.5 mg/dL (ref 0.3–1.2)
Total Protein: 6.9 g/dL (ref 6.5–8.1)

## 2015-09-14 LAB — CBC WITH DIFFERENTIAL/PLATELET
Basophils Absolute: 0 10*3/uL (ref 0.0–0.1)
Basophils Relative: 0 %
EOS ABS: 0.4 10*3/uL (ref 0.0–0.7)
EOS PCT: 3 %
HCT: 34.7 % — ABNORMAL LOW (ref 36.0–46.0)
Hemoglobin: 11.5 g/dL — ABNORMAL LOW (ref 12.0–15.0)
LYMPHS ABS: 3.9 10*3/uL (ref 0.7–4.0)
LYMPHS PCT: 34 %
MCH: 29.9 pg (ref 26.0–34.0)
MCHC: 33.1 g/dL (ref 30.0–36.0)
MCV: 90.1 fL (ref 78.0–100.0)
MONO ABS: 0.7 10*3/uL (ref 0.1–1.0)
MONOS PCT: 6 %
Neutro Abs: 6.6 10*3/uL (ref 1.7–7.7)
Neutrophils Relative %: 57 %
PLATELETS: 224 10*3/uL (ref 150–400)
RBC: 3.85 MIL/uL — ABNORMAL LOW (ref 3.87–5.11)
RDW: 14.4 % (ref 11.5–15.5)
WBC: 11.6 10*3/uL — ABNORMAL HIGH (ref 4.0–10.5)

## 2015-09-14 LAB — D-DIMER, QUANTITATIVE: D-Dimer, Quant: 0.8 ug/mL-FEU — ABNORMAL HIGH (ref 0.00–0.50)

## 2015-09-14 LAB — I-STAT TROPONIN, ED: Troponin i, poc: 0 ng/mL (ref 0.00–0.08)

## 2015-09-14 MED ORDER — NITROGLYCERIN 0.4 MG SL SUBL
0.4000 mg | SUBLINGUAL_TABLET | Freq: Once | SUBLINGUAL | Status: AC
  Administered 2015-09-14: 0.4 mg via SUBLINGUAL
  Filled 2015-09-14: qty 1

## 2015-09-14 MED ORDER — ONDANSETRON HCL 4 MG/2ML IJ SOLN
4.0000 mg | Freq: Once | INTRAMUSCULAR | Status: AC
  Administered 2015-09-14: 4 mg via INTRAVENOUS
  Filled 2015-09-14: qty 2

## 2015-09-14 MED ORDER — MORPHINE SULFATE (PF) 2 MG/ML IV SOLN
2.0000 mg | Freq: Once | INTRAVENOUS | Status: AC
  Administered 2015-09-14: 2 mg via INTRAVENOUS
  Filled 2015-09-14: qty 1

## 2015-09-14 NOTE — ED Notes (Signed)
Pt c/o left side cp since Friday with nausea and sweating.

## 2015-09-14 NOTE — ED Provider Notes (Signed)
CSN: [id number]     Arrival date & time 09/14/15  1952 History  By signing my name below, I, [facility], attest that this documentation has been prepared under the direction and in the presence of Milton Ferguson, MD. Electronically Signed: Virgel Bouquet, ED Scribe. 09/14/2015. 9:09 PM.   Chief Complaint  Patient presents with  . Chest Pain    Patient is a 60 y.o. female presenting with chest pain. The history is provided by the patient. No language interpreter was used.  Chest Pain Pain location:  L chest Pain quality comment:  Pt describes as nagging Pain radiates to:  Does not radiate Pain radiates to the back: no   Pain severity:  Moderate Onset quality:  Gradual Duration:  3 days Timing:  Intermittent Progression:  Unchanged Chronicity:  New Context: no trauma   Relieved by:  None tried Worsened by:  Nothing tried Ineffective treatments:  None tried Associated symptoms: diaphoresis and nausea   Associated symptoms: no abdominal pain, no back pain, no cough, no fatigue, no headache and no shortness of breath   Risk factors: coronary artery disease, high cholesterol, hypertension and smoking   Risk factors: no aortic disease and not female    HPI Comments: Grace Hess is a 60 y.o. female who presents to the Emergency Department complaining of intermittent, moderate, left-sided CP onset 2 days ago. Pt states that she spoke with her PCP on the phone earlier today about her CP and diaphoresis and who advised her to come to the ED for further evaluation. She reports associated diaphoresis and nausea earlier today. CP is unchanged by deep breathing. She has not taken any medications or attempted any treatmentsShe notes similar symptoms in the past that she associates with her GERD. Denies SOB or any other symptoms currently.  Past Medical History  Diagnosis Date  . Hypercholesteremia   . Obesity   . Chest pain, unspecified   . Reflux esophagitis   . Cervicalgia   .  Hypertension   . Heart murmur   . Type II diabetes mellitus ([place])   . History of hiatal hernia   . GERD (gastroesophageal reflux disease)   . Hepatitis A     "related to [employer]"  . Stroke [facility])     "they said I had a silent stroke a few months back/MRI" (12/10/2014)  . Kidney stones     "no cysto/OR" (12/10/2014)  . TIA (transient ischemic attack) 11/28/2014    [name] 11/28/2014  . Heavy smoker (more than 20 cigarettes per day)     Scheduled for LDCT screening 02/11/15   Past Surgical History  Procedure Laterality Date  . Endarterectomy Left 12/02/2014    Procedure: ENDARTERECTOMY CAROTID;  Surgeon: Rosetta Posner, MD;  Location: [facility];  Service: Vascular;  Laterality: Left;  . Cholecystectomy open  1978  . Vaginal hysterectomy  1991    "partial"   Family History  Problem Relation Age of Onset  . COPD Father   . Congestive Heart Failure Mother   . Cancer Brother    Social History  Substance Use Topics  . Smoking status: Current Every Day Smoker -- 2.00 packs/day for 41 years    Types: Cigarettes  . Smokeless tobacco: Never Used  . Alcohol Use: No   OB History    No data available     Review of Systems  Constitutional: Positive for diaphoresis. Negative for appetite change and fatigue.  HENT: Negative for congestion, ear discharge and sinus pressure.  Eyes: Negative for discharge.  Respiratory: Negative for cough and shortness of breath.   Cardiovascular: Positive for chest pain.  Gastrointestinal: Positive for nausea. Negative for abdominal pain and diarrhea.  Genitourinary: Negative for frequency and hematuria.  Musculoskeletal: Negative for back pain.  Skin: Negative for rash.  Neurological: Negative for seizures and headaches.  Psychiatric/Behavioral: Negative for hallucinations.      Allergies  Codeine; Latex; and Sulfa antibiotics  Home Medications   Prior to Admission medications   Medication Sig Start Date End Date Taking? Authorizing Provider   acetaminophen (TYLENOL) 500 MG tablet Take 500-1,000 mg by mouth every 6 (six) hours as needed for moderate pain.    Historical Provider, MD  ALPRAZolam Duanne Moron) 1 MG tablet Take 0.5 mg by mouth 2 (two) times daily.     Historical Provider, MD  aspirin EC 81 MG tablet Take 1 tablet (81 mg total) by mouth daily. Please continue Aspirin and plavix for 6 months, then plavix daily 12/03/14   Ripudeep K Rai, MD  atorvastatin (LIPITOR) 80 MG tablet Take 80 mg by mouth at bedtime.     Historical Provider, MD  clopidogrel (PLAVIX) 75 MG tablet Take 1 tablet (75 mg total) by mouth daily. 12/03/14   Ripudeep Krystal Eaton, MD  dexlansoprazole (DEXILANT) 60 MG capsule Take 60 mg by mouth daily.    Historical Provider, MD  dicyclomine (BENTYL) 10 MG capsule Take 1 capsule (10 mg total) by mouth 2 (two) times daily as needed for spasms. 07/07/15   Rogene Houston, MD  fenofibrate (TRICOR) 145 MG tablet Take 1 tablet (145 mg total) by mouth daily. Patient taking differently: Take 54 mg by mouth daily.  12/17/14   Shanker Kristeen Mans, MD  gabapentin (NEURONTIN) 300 MG capsule Take 300 mg by mouth 3 (three) times daily.    Historical Provider, MD  ketorolac (TORADOL) 10 MG tablet Take 1 tablet by mouth daily as needed. 12/12/14   Historical Provider, MD  lisinopril (PRINIVIL,ZESTRIL) 20 MG tablet Take 0.5 tablets (10 mg total) by mouth daily. Patient taking differently: Take 20 mg by mouth daily.  12/17/14   Shanker Kristeen Mans, MD  metFORMIN (GLUCOPHAGE) 1000 MG tablet Take 1 tablet (1,000 mg total) by mouth 2 (two) times daily with a meal. 12/03/14   Ripudeep K Rai, MD  oxyCODONE (OXY IR/ROXICODONE) 5 MG immediate release tablet Take 1 tablet by mouth every 4 (four) hours as needed for moderate pain.  11/19/14   Historical Provider, MD  topiramate (TOPAMAX) 25 MG tablet One tablet in the morning and 2 in the evening Patient not taking: Reported on 01/09/2015 12/25/14   Kathrynn Ducking, MD  traMADol (ULTRAM) 50 MG tablet Take 1  tablet (50 mg total) by mouth every 6 (six) hours as needed for moderate pain. Patient not taking: Reported on 07/08/2015 12/17/14   Jonetta Osgood, MD   BP 137/70 mmHg  Pulse 67  Temp(Src) 98.8 F (37.1 C)  Resp 16  Ht 5\' 6"  (1.676 m)  Wt 150 lb (68.04 kg)  BMI 24.22 kg/m2  SpO2 96% Physical Exam  Constitutional: She is oriented to person, place, and time. She appears well-developed.  HENT:  Head: Normocephalic.  Eyes: Conjunctivae and EOM are normal. No scleral icterus.  Neck: Neck supple. No thyromegaly present.  Cardiovascular: Normal rate and regular rhythm.  Exam reveals no gallop and no friction rub.   No murmur heard. Pulmonary/Chest: No stridor. She has no wheezes. She has no rales. She exhibits  no tenderness.  Abdominal: She exhibits no distension. There is tenderness in the left upper quadrant. There is no rebound.  Minimal LUQ tenderness..  Musculoskeletal: Normal range of motion. She exhibits no edema.  Lymphadenopathy:    She has no cervical adenopathy.  Neurological: She is oriented to person, place, and time. She exhibits normal muscle tone. Coordination normal.  Skin: No rash noted. No erythema.  Psychiatric: She has a normal mood and affect. Her behavior is normal.    ED Course  Procedures (including critical care time)  DIAGNOSTIC STUDIES: Oxygen Saturation is 97% on RA, normal by my interpretation.    COORDINATION OF CARE: 8:10 PM Will order nitroglycerin, morphine, Zofran, chest x-ray, and labs. Discussed treatment plan with pt at bedside and pt agreed to plan.   Labs Review Labs Reviewed  CBC WITH DIFFERENTIAL/PLATELET - Abnormal; Notable for the following:    WBC 11.6 (*)    RBC 3.85 (*)    Hemoglobin 11.5 (*)    HCT 34.7 (*)    All other components within normal limits  COMPREHENSIVE METABOLIC PANEL - Abnormal; Notable for the following:    Glucose, Bld 120 (*)    BUN 35 (*)    Creatinine, Ser 1.14 (*)    Calcium 8.6 (*)    GFR calc non  Af Amer 51 (*)    GFR calc Af Amer 59 (*)    All other components within normal limits  D-DIMER, QUANTITATIVE (NOT AT [facility]) - Abnormal; Notable for the following:    D-Dimer, Quant 0.80 (*)    All other components within normal limits  I-STAT TROPOININ, ED    Imaging Review Dg Chest Portable 1 View  09/14/2015  CLINICAL DATA:  Chest pain and nausea 2 days. EXAM: PORTABLE CHEST 1 VIEW COMPARISON:  10/15/2013 FINDINGS: Lungs are adequately inflated without focal consolidation or effusion. There is mild stable cardiomegaly. Minimal plaque over the aortic arch. Degenerative change of the spine. IMPRESSION: No acute cardiopulmonary disease. Aortic atherosclerosis.  Mild cardiomegaly. Electronically Signed   By: Marin Olp M.D.   On: 09/14/2015 20:52   I have personally reviewed and evaluated these images and lab results as part of my medical decision-making.   EKG Interpretation   Date/Time:  Sunday September 14 2015 20:04:31 EDT Ventricular Rate:  88 PR Interval:    QRS Duration: 87 QT Interval:  364 QTC Calculation: 441 R Axis:   62 Text Interpretation:  Sinus rhythm Probable left atrial enlargement Low  voltage, extremity leads RSR' in V1 or V2, probably normal variant  Confirmed by Leonel Mccollum  MD, Daniyla Pfahler [phone]) on 09/14/2015 8:08:47 PM      MDM   Final diagnoses:  None   Patient with chest pain normal troponin EKG unremarkable. Patient had elevated d-dimer. Patient stated she did not want to wait around to have the CT angios. I explained to her as she may have blood clot in her lungs. She left AMA  The chart was scribed for me under my direct supervision.  I personally performed the history, physical, and medical decision making and all procedures in the evaluation of this patient.Milton Ferguson, MD 09/14/15 [phone]

## 2015-09-15 ENCOUNTER — Other Ambulatory Visit (HOSPITAL_COMMUNITY): Payer: Self-pay | Admitting: Pulmonary Disease

## 2015-09-15 DIAGNOSIS — R791 Abnormal coagulation profile: Secondary | ICD-10-CM

## 2015-09-17 ENCOUNTER — Ambulatory Visit (HOSPITAL_COMMUNITY)
Admission: RE | Admit: 2015-09-17 | Discharge: 2015-09-17 | Disposition: A | Discharge status: Home / Self Care | Payer: BLUE CROSS/BLUE SHIELD | Source: Ambulatory Visit | Attending: Pulmonary Disease | Admitting: Pulmonary Disease

## 2015-09-17 DIAGNOSIS — R079 Chest pain, unspecified: Secondary | ICD-10-CM | POA: Diagnosis not present

## 2015-09-17 DIAGNOSIS — R791 Abnormal coagulation profile: Secondary | ICD-10-CM

## 2015-09-17 MED ORDER — IOPAMIDOL (ISOVUE-370) INJECTION 76%
100.0000 mL | Freq: Once | INTRAVENOUS | Status: AC | PRN
  Administered 2015-09-17: 100 mL via INTRAVENOUS

## 2015-10-07 ENCOUNTER — Encounter (INDEPENDENT_AMBULATORY_CARE_PROVIDER_SITE_OTHER): Payer: Self-pay | Admitting: Internal Medicine

## 2015-10-07 ENCOUNTER — Other Ambulatory Visit (INDEPENDENT_AMBULATORY_CARE_PROVIDER_SITE_OTHER): Payer: Self-pay | Admitting: Internal Medicine

## 2015-10-07 ENCOUNTER — Ambulatory Visit (INDEPENDENT_AMBULATORY_CARE_PROVIDER_SITE_OTHER): Payer: BLUE CROSS/BLUE SHIELD | Admitting: Internal Medicine

## 2015-10-07 VITALS — BP 120/72 | HR 76 | Temp 97.3°F | Resp 18 | Ht 60.0 in | Wt 156.9 lb

## 2015-10-07 DIAGNOSIS — K219 Gastro-esophageal reflux disease without esophagitis: Secondary | ICD-10-CM

## 2015-10-07 DIAGNOSIS — K589 Irritable bowel syndrome without diarrhea: Secondary | ICD-10-CM

## 2015-10-07 MED ORDER — DEXLANSOPRAZOLE 60 MG PO CPDR: 60.0000 mg | DELAYED_RELEASE_CAPSULE | Freq: Every day | ORAL | 11 refills | Status: DC

## 2015-10-07 NOTE — Patient Instructions (Addendum)
Take dicyclomine and schedule rather than when necessary basis. If stool frequency does not decrease may consider coming off metformin. Will talk with Dr. Luan Pulling. Then take Pepcid OTC 20 mg in the evening or at bedtime 1 as-needed basis. Consider treatment options if you not able to quit cigarette smoking on your own.

## 2015-10-07 NOTE — Progress Notes (Signed)
Presenting complaint;  Follow-up for GERD and diarrhea.  Subjective:  Patient is 60 year old Caucasian female who is here for scheduled visit. She was last seen 3 months ago. She needs new prescription for PPI. She continues to have heartburn particularly at night. She continues to smoke cigarettes. He denies dysphagia nausea or vomiting. She remains with intermittent diarrhea. She says she has anywhere from 1-10 stools per day. She is still having accidents and therefore wears depends. She says 50% of stools are formed and rest are loose and she is passing mucus but no blood. She has gained 6 pounds since her last visit. She denies melena.    Current Medications: Outpatient Encounter Prescriptions as of 10/07/2015  Medication Sig  . acetaminophen (TYLENOL) 500 MG tablet Take 500-1,000 mg by mouth every 6 (six) hours as needed for moderate pain.  Marland Kitchen ALPRAZolam (XANAX) 1 MG tablet Take 0.5 mg by mouth 3 (three) times daily as needed.   Marland Kitchen aspirin EC 81 MG tablet Take 1 tablet (81 mg total) by mouth daily. Please continue Aspirin and plavix for 6 months, then plavix daily  . atorvastatin (LIPITOR) 80 MG tablet Take 80 mg by mouth at bedtime.   . clopidogrel (PLAVIX) 75 MG tablet Take 1 tablet (75 mg total) by mouth daily.  Marland Kitchen dexlansoprazole (DEXILANT) 60 MG capsule Take 60 mg by mouth daily.  Marland Kitchen dicyclomine (BENTYL) 10 MG capsule Take 1 capsule (10 mg total) by mouth 2 (two) times daily as needed for spasms.  Marland Kitchen gabapentin (NEURONTIN) 300 MG capsule Take 300 mg by mouth 2 (two) times daily.   Marland Kitchen lisinopril (PRINIVIL,ZESTRIL) 20 MG tablet Take 0.5 tablets (10 mg total) by mouth daily. (Patient taking differently: Take 20 mg by mouth daily. )  . metFORMIN (GLUCOPHAGE) 1000 MG tablet Take 1 tablet (1,000 mg total) by mouth 2 (two) times daily with a meal.  . oxyCODONE (OXY IR/ROXICODONE) 5 MG immediate release tablet Take 1 tablet by mouth every 4 (four) hours as needed for moderate pain.   .  [DISCONTINUED] fenofibrate (TRICOR) 145 MG tablet Take 1 tablet (145 mg total) by mouth daily. (Patient not taking: Reported on 10/07/2015)  . [DISCONTINUED] ketorolac (TORADOL) 10 MG tablet Take 1 tablet by mouth daily as needed.  . [DISCONTINUED] topiramate (TOPAMAX) 25 MG tablet One tablet in the morning and 2 in the evening (Patient not taking: Reported on 01/09/2015)  . [DISCONTINUED] traMADol (ULTRAM) 50 MG tablet Take 1 tablet (50 mg total) by mouth every 6 (six) hours as needed for moderate pain. (Patient not taking: Reported on 07/08/2015)   No facility-administered encounter medications on file as of 10/07/2015.      Objective: Blood pressure 120/72, pulse 76, temperature 97.3 F (36.3 C), temperature source Oral, resp. rate 18, height 5' (1.524 m), weight 156 lb 14.4 oz (71.2 kg). Asian is alert and in no acute distress. Conjunctiva is pink. Sclera is nonicteric Oropharyngeal mucosa is normal. No neck masses or thyromegaly noted. She has left carotid endarterectomy scar. Cardiac exam with regular rhythm normal S1 and S2. Faint systolic ejection murmur noted at left sternal border. Lungs are clear to auscultation. Abdomen is symmetrical soft and nontender without organomegaly or masses. No LE edema or clubbing noted.   Assessment:  #1. GERD. Symptoms are not well controlled with therapy. She needs to quit cigarette smoking which would help. He can take OTC H2 B at night on when necessary basis. #2. Irritable bowel syndrome. She is still having multiple bowel movements  and some days but she is not taking dicyclomine on schedule. She needs to do so and if diarrhea persists then she may have to come off metformin.   Plan:  New prescription for Dexilant given for 60 mg by mouth every morning. Patient advised to take dicyclomine 10 mg twice daily particularly if it is working. We will touch base with Dr. Luan Pulling if she could come off metformin. Pepcid OTC by mouth daily at bedtime  when necessary. Patient counseled for the need to quit cigarette smoking. If she cannot do it on her own she should look at other options. Office visit in one year.

## 2015-10-14 ENCOUNTER — Ambulatory Visit: Payer: 59 | Admitting: Vascular Surgery

## 2015-10-14 ENCOUNTER — Encounter (HOSPITAL_COMMUNITY): Payer: 59

## 2015-11-19 ENCOUNTER — Other Ambulatory Visit (INDEPENDENT_AMBULATORY_CARE_PROVIDER_SITE_OTHER): Payer: Self-pay | Admitting: Internal Medicine

## 2015-11-19 MED ORDER — ONDANSETRON HCL 4 MG PO TABS: 4.0000 mg | ORAL_TABLET | Freq: Two times a day (BID) | ORAL | 1 refills | Status: DC | PRN

## 2015-12-15 DIAGNOSIS — J40 Bronchitis, not specified as acute or chronic: Secondary | ICD-10-CM | POA: Diagnosis not present

## 2015-12-15 DIAGNOSIS — J069 Acute upper respiratory infection, unspecified: Secondary | ICD-10-CM | POA: Diagnosis not present

## 2015-12-15 DIAGNOSIS — J329 Chronic sinusitis, unspecified: Secondary | ICD-10-CM | POA: Diagnosis not present

## 2015-12-19 ENCOUNTER — Encounter: Payer: Self-pay | Admitting: Vascular Surgery

## 2015-12-23 ENCOUNTER — Ambulatory Visit (INDEPENDENT_AMBULATORY_CARE_PROVIDER_SITE_OTHER): Payer: BLUE CROSS/BLUE SHIELD | Admitting: Vascular Surgery

## 2015-12-23 ENCOUNTER — Encounter: Payer: Self-pay | Admitting: Vascular Surgery

## 2015-12-23 ENCOUNTER — Ambulatory Visit (HOSPITAL_COMMUNITY)
Admission: RE | Admit: 2015-12-23 | Discharge: 2015-12-23 | Disposition: A | Discharge status: Home / Self Care | Payer: BLUE CROSS/BLUE SHIELD | Source: Ambulatory Visit | Attending: Vascular Surgery | Admitting: Vascular Surgery

## 2015-12-23 VITALS — BP 164/82 | HR 78 | Temp 97.6°F | Resp 18 | Ht 60.0 in | Wt 156.1 lb

## 2015-12-23 DIAGNOSIS — I6523 Occlusion and stenosis of bilateral carotid arteries: Secondary | ICD-10-CM | POA: Diagnosis not present

## 2015-12-23 DIAGNOSIS — G459 Transient cerebral ischemic attack, unspecified: Secondary | ICD-10-CM | POA: Insufficient documentation

## 2015-12-23 LAB — VAS US CAROTID
LCCAPSYS: 110 cm/s
LEFT ECA DIAS: -13 cm/s
LICAPSYS: 98 cm/s
Left CCA dist dias: -32 cm/s
Left CCA dist sys: -161 cm/s
Left CCA prox dias: 22 cm/s
Left ICA dist dias: -29 cm/s
Left ICA dist sys: -94 cm/s
Left ICA prox dias: 26 cm/s
RCCADSYS: -89 cm/s
RCCAPDIAS: 21 cm/s
RIGHT CCA MID DIAS: 30 cm/s
RIGHT ECA DIAS: -52 cm/s
Right CCA prox sys: 130 cm/s

## 2015-12-23 NOTE — Progress Notes (Signed)
[facility] [place]  Patient name: Grace Hess MRN: [id number] DOB: 1955/05/27 Sex: female  REASON FOR VISIT: Here today for follow-up of carotid endarterectomy from September 2016. She had a preoperative nerve neurologic event. Portions she remains asymptomatic at this time. Is no new amaurosis fugax, transient ischemic attack or stroke. She reports that that one son died last year and another son had the coronary bypass after a significant heart attack several months ago.    Past Medical History:  Diagnosis Date  . Cervicalgia   . Chest pain, unspecified   . GERD (gastroesophageal reflux disease)   . Heart murmur   . Heavy smoker (more than 20 cigarettes per day)    Scheduled for LDCT screening 02/11/15  . Hepatitis A    "related to [employer]"  . History of hiatal hernia   . Hypercholesteremia   . Hypertension   . Kidney stones    "no cysto/OR" (12/10/2014)  . Obesity   . Reflux esophagitis   . Stroke [facility])    "they said I had a silent stroke a few months back/MRI" (12/10/2014)  . TIA (transient ischemic attack) 11/28/2014   [name] 11/28/2014  . Type II diabetes mellitus ([facility])     Family History  Problem Relation Age of Onset  . COPD Father   . Congestive Heart Failure Mother   . Cancer Brother     SOCIAL HISTORY: Social History  Substance Use Topics  . Smoking status: Current Every Day Smoker    Packs/day: 2.00    Years: 41.00    Types: Cigarettes  . Smokeless tobacco: Never Used  . Alcohol use No    Allergies  Allergen Reactions  . Codeine Shortness Of Breath  . Latex Itching  . Sulfa Antibiotics Other (See Comments)    Reaction:  Unknown     Current Outpatient Prescriptions  Medication Sig Dispense Refill  . acetaminophen (TYLENOL) 500 MG tablet Take 500-1,000 mg by mouth every 6 (six) hours as needed for moderate pain.    Marland Kitchen ALPRAZolam (XANAX) 1 MG tablet Take 0.5 mg by mouth 3 (three) times  daily as needed.     Marland Kitchen aspirin EC 81 MG tablet Take 1 tablet (81 mg total) by mouth daily. Please continue Aspirin and plavix for 6 months, then plavix daily 30 tablet 6  . atorvastatin (LIPITOR) 80 MG tablet Take 80 mg by mouth at bedtime.     . clopidogrel (PLAVIX) 75 MG tablet Take 1 tablet (75 mg total) by mouth daily. 30 tablet 3  . dexlansoprazole (DEXILANT) 60 MG capsule Take 1 capsule (60 mg total) by mouth daily. 30 capsule 11  . dicyclomine (BENTYL) 10 MG capsule Take 1 capsule (10 mg total) by mouth 2 (two) times daily as needed for spasms. 90 capsule 3  . gabapentin (NEURONTIN) 300 MG capsule Take 300 mg by mouth 2 (two) times daily.     Marland Kitchen lisinopril (PRINIVIL,ZESTRIL) 20 MG tablet Take 0.5 tablets (10 mg total) by mouth daily. (Patient taking differently: Take 20 mg by mouth daily. ) 30 tablet 0  . metFORMIN (GLUCOPHAGE) 1000 MG tablet Take 1 tablet (1,000 mg total) by mouth 2 (two) times daily with a meal. 60 tablet 3  . ondansetron (ZOFRAN) 4 MG tablet Take 1 tablet (4 mg total) by mouth 2 (two) times daily as needed for nausea or vomiting. 20 tablet 1  . oxyCODONE (OXY IR/ROXICODONE) 5 MG immediate release tablet Take 1 tablet by  mouth every 4 (four) hours as needed for moderate pain.   0   No current facility-administered medications for this visit.     REVIEW OF SYSTEMS:  [X]  denotes positive finding, [ ]  denotes negative finding Cardiac  Comments:  Chest pain or chest pressure:    Shortness of breath upon exertion:    Short of breath when lying flat:    Irregular heart rhythm:        Vascular    Pain in calf, thigh, or hip brought on by ambulation:    Pain in feet at night that wakes you up from your sleep:     Blood clot in your veins:    Leg swelling:         Pulmonary    Oxygen at home:    Productive cough:     Wheezing:         Neurologic    Sudden weakness in arms or legs:     Sudden numbness in arms or legs:     Sudden onset of difficulty speaking or  slurred speech:    Temporary loss of vision in one eye:     Problems with dizziness:         Gastrointestinal    Blood in stool:     Vomited blood:         Genitourinary    Burning when urinating:     Blood in urine:        Psychiatric    Major depression:         Hematologic    Bleeding problems:    Problems with blood clotting too easily:        Skin    Rashes or ulcers:        Constitutional    Fever or chills:      PHYSICAL EXAM: Vitals:   12/23/15 1228 12/23/15 1231 12/23/15 1232 12/23/15 1233  BP: (!) 152/74 (!) 148/78 (!) 147/83 (!) 164/82  Pulse: 78     Resp: 18     Temp: 97.6 F (36.4 C)     TempSrc: Oral     SpO2: 96%     Weight: 156 lb 1.6 oz (70.8 kg)     Height: 5' (1.524 m)       GENERAL: The patient is a well-nourished female, in no acute distress. The vital signs are documented above. VASCULAR: 2+ radial pulses bilaterally. Left carotid incision well-healed with no bruits bilaterally PULMONARY: There is good air exchange  MUSCULOSKELETAL: There are no major deformities or cyanosis. NEUROLOGIC: No focal weakness or paresthesias are detected. SKIN: There are no ulcers or rashes noted. PSYCHIATRIC: The patient has a normal affect.  DATA:  Duplex today reveals widely patent endarterectomy on the left and moderate 40-59% stenosis in the right internal carotid artery  MEDICAL ISSUES: Stable follow-up after left carotid endarterectomy 1 year ago. We'll continue her usual activities. We'll see her again in one year with a carotid duplex evaluation or follow-up. She understands that she will see our nurse practitioner with her next evaluation    Rosetta Posner, MD [facility] Tel [phone] Pager [phone]

## 2015-12-23 NOTE — Progress Notes (Signed)
Vitals:   12/23/15 1228 12/23/15 1231 12/23/15 1232 12/23/15 1233  BP: (!) 152/74 (!) 148/78 (!) 147/83 (!) 164/82  Pulse: 78     Resp: 18     Temp: 97.6 F (36.4 C)     TempSrc: Oral     SpO2: 96%     Weight: 156 lb 1.6 oz (70.8 kg)     Height: 5' (1.524 m)

## 2016-01-07 ENCOUNTER — Ambulatory Visit (INDEPENDENT_AMBULATORY_CARE_PROVIDER_SITE_OTHER): Payer: 59 | Admitting: Internal Medicine

## 2016-01-07 ENCOUNTER — Encounter (INDEPENDENT_AMBULATORY_CARE_PROVIDER_SITE_OTHER): Payer: Self-pay | Admitting: Internal Medicine

## 2016-02-02 NOTE — Addendum Note (Signed)
Addended by: Lianne Cure A on: 02/02/2016 08:39 AM   Modules accepted: Orders

## 2016-02-18 DIAGNOSIS — F321 Major depressive disorder, single episode, moderate: Secondary | ICD-10-CM | POA: Diagnosis not present

## 2016-02-18 DIAGNOSIS — I10 Essential (primary) hypertension: Secondary | ICD-10-CM | POA: Diagnosis not present

## 2016-02-18 DIAGNOSIS — E785 Hyperlipidemia, unspecified: Secondary | ICD-10-CM | POA: Diagnosis not present

## 2016-02-18 DIAGNOSIS — I739 Peripheral vascular disease, unspecified: Secondary | ICD-10-CM | POA: Diagnosis not present

## 2016-02-20 ENCOUNTER — Encounter: Payer: Self-pay | Admitting: Acute Care

## 2016-04-14 ENCOUNTER — Other Ambulatory Visit (INDEPENDENT_AMBULATORY_CARE_PROVIDER_SITE_OTHER): Payer: Self-pay | Admitting: *Deleted

## 2016-04-14 MED ORDER — ONDANSETRON HCL 4 MG PO TABS: 4.0000 mg | ORAL_TABLET | Freq: Two times a day (BID) | ORAL | 1 refills | Status: DC | PRN

## 2016-04-14 NOTE — Telephone Encounter (Signed)
Patient called the ov asking for a refill ,Zofran. It was last given 11/19/2015 with 1 refill. Patient was last seen in the office 09/2015. She is on a recall. Per Dr.Rehman may fill with 1 refill , this was E-Scribed to [employer]. Patient was called and made aware.

## 2016-04-23 IMAGING — MR MR HEAD W/O CM
8 of 10 series · 33 of 48 positions shown · non-contrast
Comparison: MRI 12/16/2014

CLINICAL DATA: Right arm and leg numbness.

EXAM:
MRI HEAD WITHOUT CONTRAST
TECHNIQUE: Multiplanar, multiecho pulse sequences of the brain and surrounding
structures were obtained without intravenous contrast.

[Series 2: t1_fl2d_sag · sagittal · 5.0mm · 0.45mm/px · 3 of 20 slices shown]
[im 1/20]
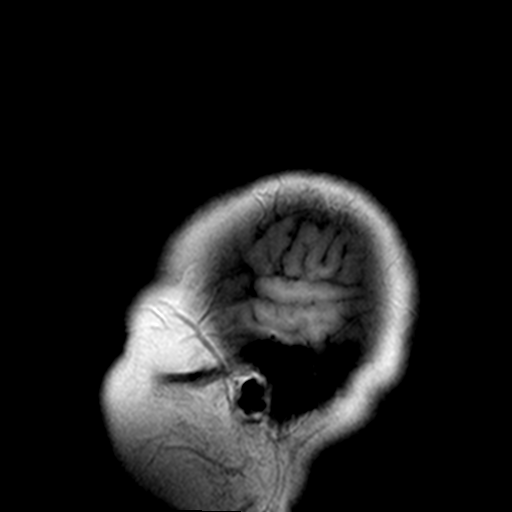
[im 10/20]
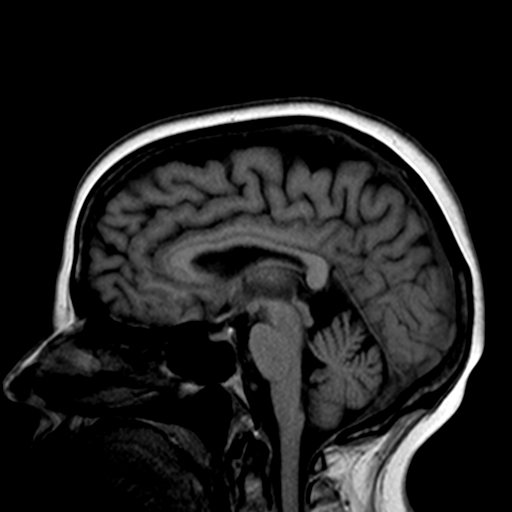
[im 20/20]
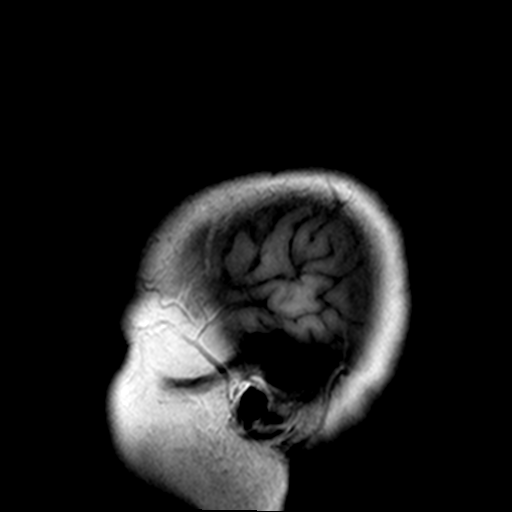

[Series 6: T2 · axial · 5.0mm · 0.75mm/px · z∈[-128,+15]mm · 4 of 23 slices shown (1 of 2)]
[im 1/23]
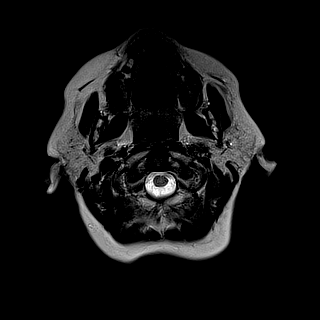
[im 8/23]
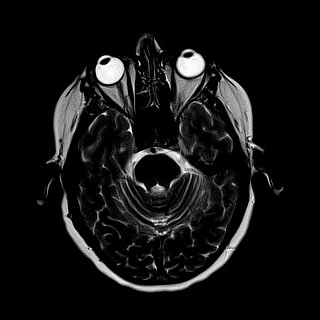
[im 15/23]
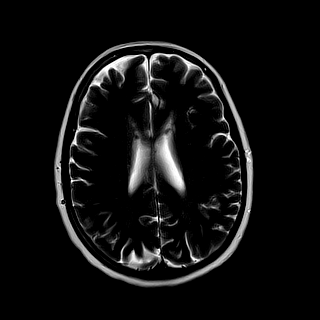
[im 23/23]
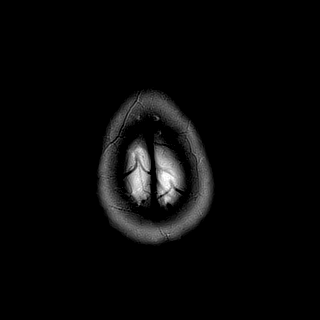

[Series 7: FLAIR · axial · 5.0mm · 0.94mm/px · z∈[-128,+15]mm · 3 of 23 slices shown]
[im 1/23]
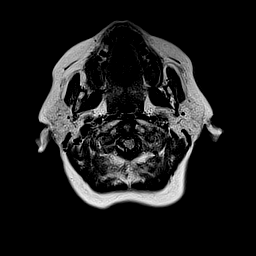
[im 12/23]
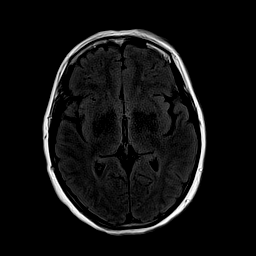
[im 23/23]
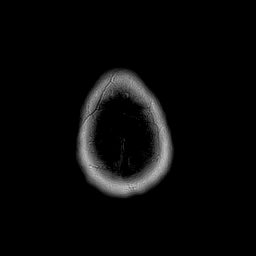

[Series 8: T1 · axial · 2.0mm · 0.47mm/px · z∈[-142,+45]mm · 11 of 95 slices shown]
[im 1/95]
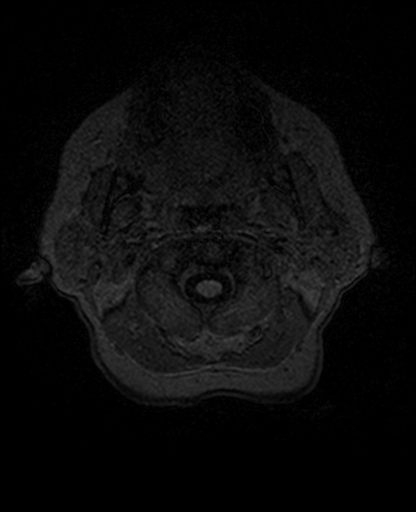
[im 10/95]
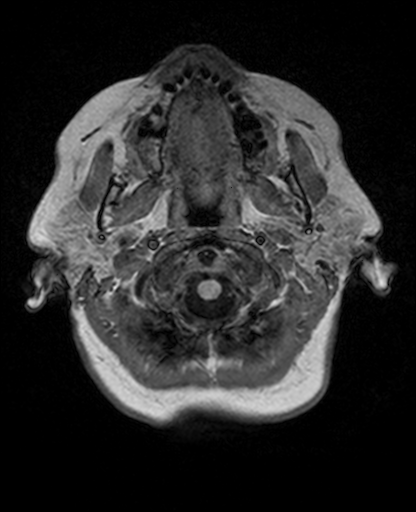
[im 19/95]
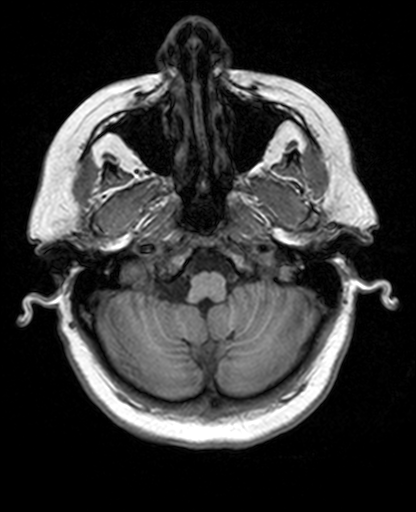
[im 29/95]
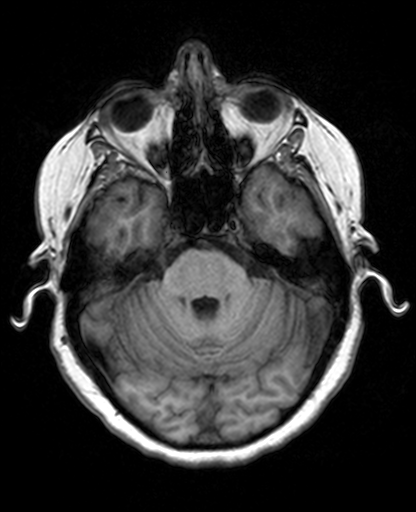
[im 38/95]
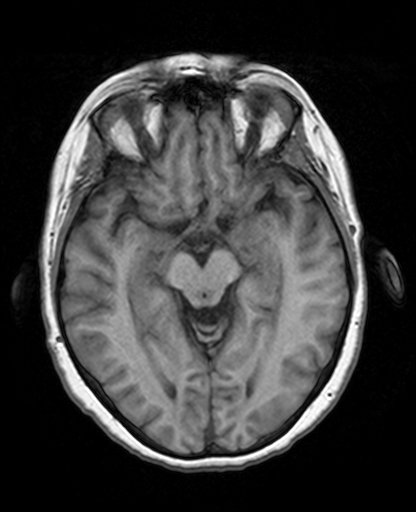
[im 48/95]
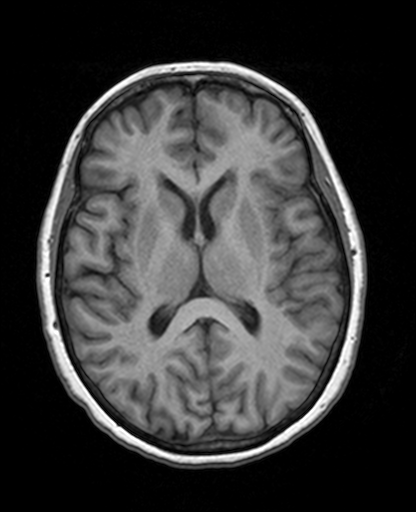
[im 57/95]
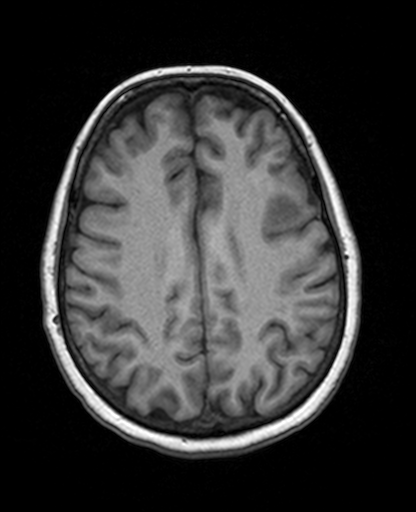
[im 66/95]
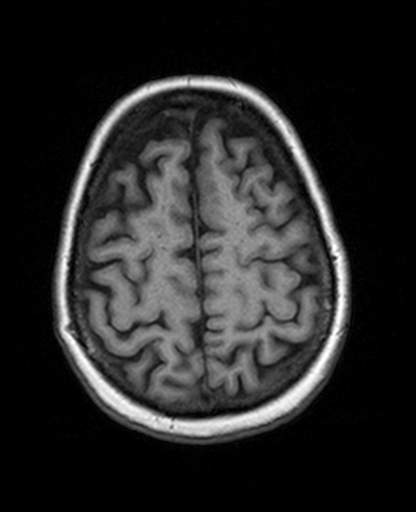
[im 76/95]
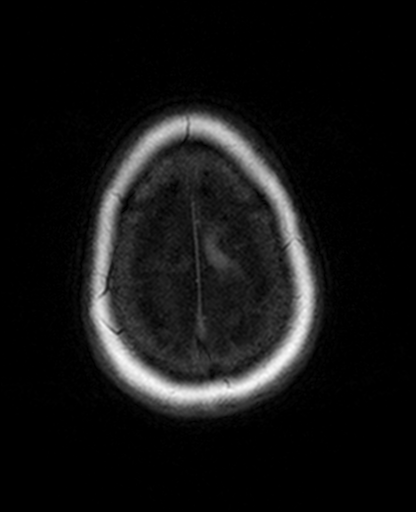
[im 85/95]
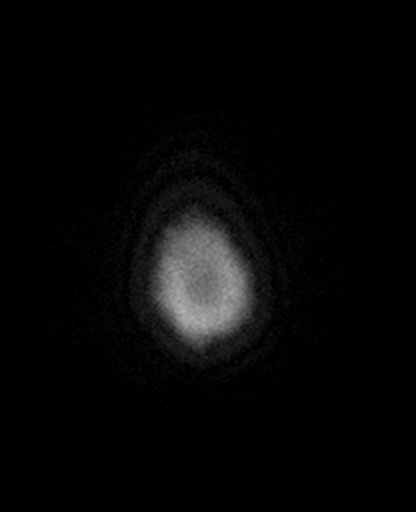
[im 95/95]
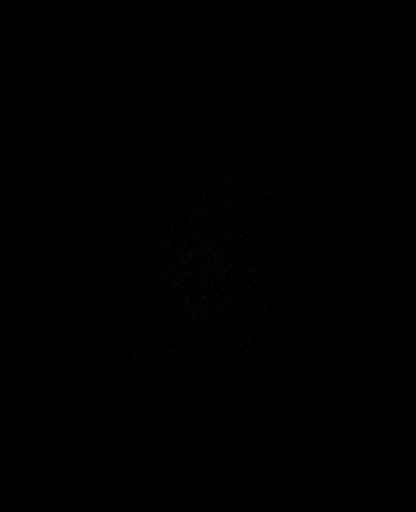

[Series 9: trauma axial · axial · 5.0mm · 0.45mm/px · z∈[-122,+8]mm · 2 of 21 slices shown]
[im 1/21]
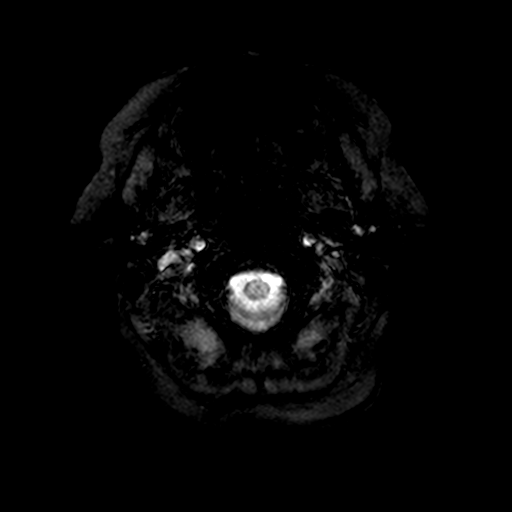
[im 21/21]
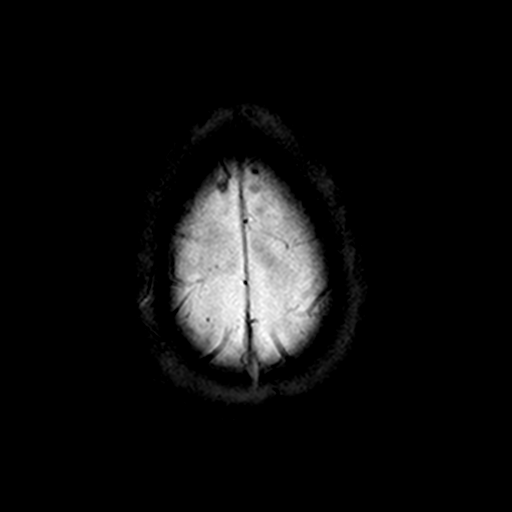

[Series 10: T2 · coronal · 5.0mm · 0.67mm/px · 3 of 28 slices shown (2 of 2)]
[im 1/28]
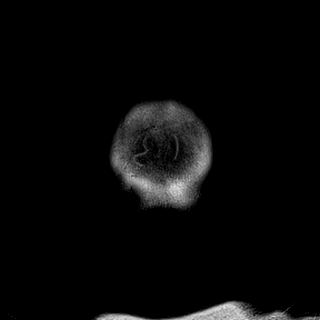
[im 14/28]
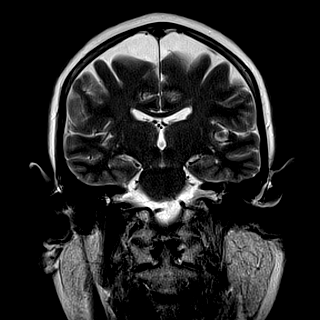
[im 28/28]
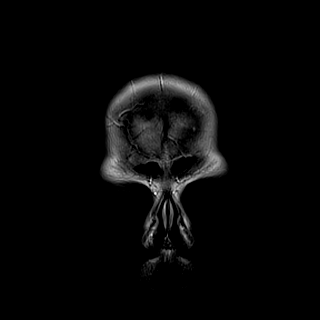

[Series 100: <mpr thick range> · axial · 3.0mm · 0.82mm/px · z∈[-127,+9]mm · 5 of 46 slices shown]
[im 1/46]
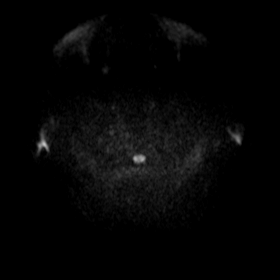
[im 12/46]
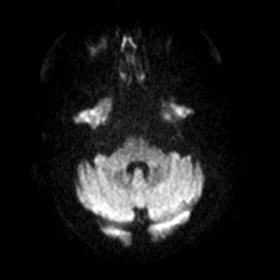
[im 23/46]
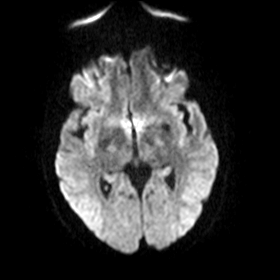
[im 34/46]
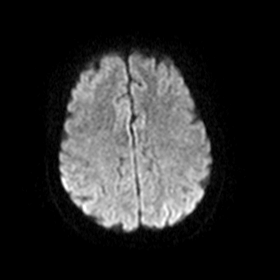
[im 46/46]
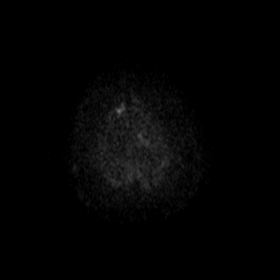

[Series 101: <mpr thick range(1)> · coronal · 3.0mm · 0.82mm/px · 2 of 55 slices shown]
[im 1/55]
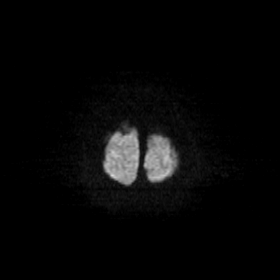
[im 11/55]
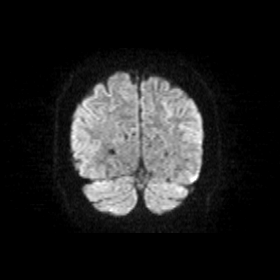

[33 of 48 positions shown; findings below may reference images not displayed]

FINDINGS: Ventricle size normal. Cerebral volume normal for age. Pituitary
normal in size.

Negative for acute infarct. Mild hyperintensity in the
periventricular white matter bilaterally unchanged. Dilated
perivascular space left basal ganglia unchanged. Brainstem and
cerebellum normal.

Negative for intracranial hemorrhage or fluid collection. Negative
for mass or edema.

Paranasal sinuses clear.  Normal orbit.
IMPRESSION: Negative for acute infarct and no change from the recent MRI.

## 2016-06-12 ENCOUNTER — Other Ambulatory Visit (INDEPENDENT_AMBULATORY_CARE_PROVIDER_SITE_OTHER): Payer: Self-pay | Admitting: Internal Medicine

## 2016-06-22 DIAGNOSIS — R21 Rash and other nonspecific skin eruption: Secondary | ICD-10-CM | POA: Diagnosis not present

## 2016-06-22 DIAGNOSIS — I10 Essential (primary) hypertension: Secondary | ICD-10-CM | POA: Diagnosis not present

## 2016-06-22 DIAGNOSIS — E1165 Type 2 diabetes mellitus with hyperglycemia: Secondary | ICD-10-CM | POA: Diagnosis not present

## 2016-06-22 DIAGNOSIS — R079 Chest pain, unspecified: Secondary | ICD-10-CM | POA: Diagnosis not present

## 2016-06-22 DIAGNOSIS — I779 Disorder of arteries and arterioles, unspecified: Secondary | ICD-10-CM | POA: Diagnosis not present

## 2016-06-28 ENCOUNTER — Encounter: Payer: Self-pay | Admitting: Cardiology

## 2016-07-13 ENCOUNTER — Encounter: Payer: Self-pay | Admitting: Cardiology

## 2016-07-13 ENCOUNTER — Ambulatory Visit (INDEPENDENT_AMBULATORY_CARE_PROVIDER_SITE_OTHER): Payer: BLUE CROSS/BLUE SHIELD | Admitting: Cardiology

## 2016-07-13 VITALS — BP 154/80 | HR 94 | Wt 158.0 lb

## 2016-07-13 DIAGNOSIS — M79605 Pain in left leg: Secondary | ICD-10-CM

## 2016-07-13 DIAGNOSIS — R079 Chest pain, unspecified: Secondary | ICD-10-CM

## 2016-07-13 DIAGNOSIS — M79604 Pain in right leg: Secondary | ICD-10-CM | POA: Diagnosis not present

## 2016-07-13 DIAGNOSIS — I10 Essential (primary) hypertension: Secondary | ICD-10-CM

## 2016-07-13 DIAGNOSIS — E782 Mixed hyperlipidemia: Secondary | ICD-10-CM

## 2016-07-13 NOTE — Patient Instructions (Signed)
Medication Instructions:  Your physician recommends that you continue on your current medications as directed. Please refer to the Current Medication list given to you today.   Labwork: I WILL REQUEST A COPY OF LABS FROM PCP.  Testing/Procedures: Your physician has requested that you have a lexiscan myoview. For further information please visit [email or web address]. Please follow instruction sheet, as given.  Your physician has requested that you have an ankle brachial index (ABI). During this test an ultrasound and blood pressure cuff are used to evaluate the arteries that supply the arms and legs with blood. Allow thirty minutes for this exam. There are no restrictions or special instructions.    Follow-Up: Your physician recommends that you schedule a follow-up appointment in: 6 WEEKS    Any Other Special Instructions Will Be Listed Below (If Applicable).     If you need a refill on your cardiac medications before your next appointment, please call your pharmacy.

## 2016-07-13 NOTE — Progress Notes (Signed)
Clinical Summary Ms. Cesaro is a 61 y.o.female seen as new patient. Seen by Dr Lattie Haw several years ago  1. Chest pain - symptoms started about 3 months. Sharp pain left, 7/10 that occurred at rest. Can have some palpitations at times. Lasted a few hours. Better with ibuprofen and pain pill. Worst with deep breath. Total of 20 episodes.  - can have some DOE with walking inclines   2. Carotid stenosis - history of prior left CEA - history of prior stroke.  - followed by vascular  3. DM2 - followed by pcp. Compliant with oral regimen  4. HTN - compliant with meds - clinic visit with pcp earlier this month 110/62  5. Leg pains - pain in bilateral calves with walking. Pain with 1/2 block, worst with incline. Better with rest - No sores on feet.    6. Hyperlipidemia - muscle aches on atorvastatin. Crestor causes muscle aches. Tried on several others     Past Medical History:  Diagnosis Date  . Cervicalgia   . Chest pain, unspecified   . GERD (gastroesophageal reflux disease)   . Heart murmur   . Heavy smoker (more than 20 cigarettes per day)    Scheduled for LDCT screening 02/11/15  . Hepatitis A    "related to [employer]"  . History of hiatal hernia   . Hypercholesteremia   . Hypertension   . Kidney stones    "no cysto/OR" (12/10/2014)  . Obesity   . Reflux esophagitis   . Stroke [facility])    "they said I had a silent stroke a few months back/MRI" (12/10/2014)  . TIA (transient ischemic attack) 11/28/2014   [name] 11/28/2014  . Type II diabetes mellitus (HCC)      Allergies  Allergen Reactions  . Codeine Shortness Of Breath  . Latex Itching  . Sulfa Antibiotics Other (See Comments)    Reaction:  Unknown      Current Outpatient Prescriptions  Medication Sig Dispense Refill  . acetaminophen (TYLENOL) 500 MG tablet Take 500-1,000 mg by mouth every 6 (six) hours as needed for moderate pain.    Marland Kitchen ALPRAZolam (XANAX) 1 MG tablet Take 0.5 mg by mouth 3  (three) times daily as needed.     Marland Kitchen aspirin EC 81 MG tablet Take 1 tablet (81 mg total) by mouth daily. Please continue Aspirin and plavix for 6 months, then plavix daily 30 tablet 6  . atorvastatin (LIPITOR) 80 MG tablet Take 80 mg by mouth at bedtime.     . clopidogrel (PLAVIX) 75 MG tablet Take 1 tablet (75 mg total) by mouth daily. 30 tablet 3  . dexlansoprazole (DEXILANT) 60 MG capsule Take 1 capsule (60 mg total) by mouth daily. 30 capsule 11  . dicyclomine (BENTYL) 10 MG capsule Take 1 capsule (10 mg total) by mouth 2 (two) times daily as needed for spasms. 90 capsule 3  . gabapentin (NEURONTIN) 300 MG capsule Take 300 mg by mouth 2 (two) times daily.     Marland Kitchen lisinopril (PRINIVIL,ZESTRIL) 20 MG tablet Take 0.5 tablets (10 mg total) by mouth daily. (Patient taking differently: Take 20 mg by mouth daily. ) 30 tablet 0  . metFORMIN (GLUCOPHAGE) 1000 MG tablet Take 1 tablet (1,000 mg total) by mouth 2 (two) times daily with a meal. 60 tablet 3  . ondansetron (ZOFRAN) 4 MG tablet TAKE 1 TABLET(4 MG) BY MOUTH TWICE DAILY AS NEEDED FOR NAUSEA OR VOMITING 20 tablet 1  . oxyCODONE (OXY IR/ROXICODONE)  5 MG immediate release tablet Take 1 tablet by mouth every 4 (four) hours as needed for moderate pain.   0   No current facility-administered medications for this visit.      Past Surgical History:  Procedure Laterality Date  . CHOLECYSTECTOMY OPEN  1978  . ENDARTERECTOMY Left 12/02/2014   Procedure: ENDARTERECTOMY CAROTID;  Surgeon: Rosetta Posner, MD;  Location: [facility];  Service: Vascular;  Laterality: Left;  Marland Kitchen VAGINAL HYSTERECTOMY  1991   "partial"     Allergies  Allergen Reactions  . Codeine Shortness Of Breath  . Latex Itching  . Sulfa Antibiotics Other (See Comments)    Reaction:  Unknown       Family History  Problem Relation Age of Onset  . COPD Father   . Congestive Heart Failure Mother   . Cancer Brother      Social History Ms. Bramble reports that she has been smoking  Cigarettes.  She has a 82.00 pack-year smoking history. She has never used smokeless tobacco. Ms. Houdek reports that she does not drink alcohol.   Review of Systems CONSTITUTIONAL: No weight loss, fever, chills, weakness or fatigue.  HEENT: Eyes: No visual loss, blurred vision, double vision or yellow sclerae.No hearing loss, sneezing, congestion, runny nose or sore throat.  SKIN: No rash or itching.  CARDIOVASCULAR: per HPI RESPIRATORY: per HPI GASTROINTESTINAL: No anorexia, nausea, vomiting or diarrhea. No abdominal pain or blood.  GENITOURINARY: No burning on urination, no polyuria NEUROLOGICAL: No headache, dizziness, syncope, paralysis, ataxia, numbness or tingling in the extremities. No change in bowel or bladder control.  MUSCULOSKELETAL: per HPI LYMPHATICS: No enlarged nodes. No history of splenectomy.  PSYCHIATRIC: No history of depression or anxiety.  ENDOCRINOLOGIC: No reports of sweating, cold or heat intolerance. No polyuria or polydipsia.  Marland Kitchen   Physical Examination Vitals:   07/13/16 0925 07/13/16 0927  BP: 132/70 (!) 154/80  Pulse: 94    Vitals:   07/13/16 0925  Weight: 158 lb (71.7 kg)    Gen: resting comfortably, no acute distress HEENT: no scleral icterus, pupils equal round and reactive, no palptable cervical adenopathy,  CV: RRR, 2/6 systolic murmur RUSB. Decrased left DP/PT, decreased right PT Resp: Clear to auscultation bilaterally GI: abdomen is soft, non-tender, non-distended, normal bowel sounds, no hepatosplenomegaly MSK: extremities are warm, no edema.  Skin: warm, no rash Neuro:  no focal deficits Psych: appropriate affect   Diagnostic Studies 11/2014 echo Study Conclusions  - Left ventricle: The cavity size was normal. There was mild focal   basal hypertrophy of the septum. Systolic function was normal.   The estimated ejection fraction was in the range of 60% to 65%.   Wall motion was normal; there were no regional wall motion    abnormalities. Doppler parameters are consistent with abnormal   left ventricular relaxation (grade 1 diastolic dysfunction). - Aortic valve: Trileaflet; mildly calcified leaflets. There was   trivial regurgitation. - Mitral valve: Calcified annulus. - Right atrium: Central venous pressure (est): 3 mm Hg. - Atrial septum: There was increased thickness of the septum,   consistent with lipomatous hypertrophy. No defect or patent   foramen ovale was identified. - Tricuspid valve: There was trivial regurgitation. - Pulmonary arteries: Systolic pressure could not be accurately   estimated. - Pericardium, extracardiac: A prominent pericardial fat pad was   present.  Impressions:  - Mild basal septal LV hypertrophy with LVEF 60-65% and grade 1   diastolic dysfunction. Mildly calcified mitral annulus as well  as   mildly sclerotic aortic valve. Trivial aortic regurgitation. No   obvious PFO or ASD.    Assessment and Plan  1. Chest pain  - atypical symptoms, however given her DM2 she is at risk for atypical angina. Multiple CAD risk factors including known carotid stenosis, DM2, HTN, HL, tobacco abuse, and family history of CAD. Multiple of her children have had advanced CAD and PAD - we will plan for lexiscan  2. HTN - elevated in clinic, at goal at last pcp visit 2 weeks ago. Continue to monitor at this time  3. Hyperlipidemia - intolerant to statins. Requests labs from pcp, may consider zetia or praluent  4. Leg pains - typical claudication symptoms, decreases pulses on exam - order ABIs  5. DM2 - request labs from pcp - she is on ASA, ACE-I. Statin intolerant.    F/u 6 weeks   Arnoldo Lenis, M.D.,

## 2016-07-22 ENCOUNTER — Encounter (HOSPITAL_COMMUNITY)
Admission: RE | Admit: 2016-07-22 | Discharge: 2016-07-22 | Disposition: A | Discharge status: Home / Self Care | Payer: BLUE CROSS/BLUE SHIELD | Source: Ambulatory Visit | Attending: Cardiology | Admitting: Cardiology

## 2016-07-22 ENCOUNTER — Encounter (HOSPITAL_BASED_OUTPATIENT_CLINIC_OR_DEPARTMENT_OTHER)
Admission: RE | Admit: 2016-07-22 | Discharge: 2016-07-22 | Disposition: A | Discharge status: Home / Self Care | Payer: BLUE CROSS/BLUE SHIELD | Source: Ambulatory Visit | Attending: Cardiology | Admitting: Cardiology

## 2016-07-22 ENCOUNTER — Encounter (HOSPITAL_COMMUNITY): Payer: Self-pay

## 2016-07-22 ENCOUNTER — Ambulatory Visit (HOSPITAL_COMMUNITY)
Admission: RE | Admit: 2016-07-22 | Discharge: 2016-07-22 | Disposition: A | Discharge status: Home / Self Care | Payer: BLUE CROSS/BLUE SHIELD | Source: Ambulatory Visit | Attending: Cardiology | Admitting: Cardiology

## 2016-07-22 DIAGNOSIS — M79604 Pain in right leg: Secondary | ICD-10-CM

## 2016-07-22 DIAGNOSIS — R9439 Abnormal result of other cardiovascular function study: Secondary | ICD-10-CM | POA: Diagnosis not present

## 2016-07-22 DIAGNOSIS — R079 Chest pain, unspecified: Secondary | ICD-10-CM

## 2016-07-22 DIAGNOSIS — M79605 Pain in left leg: Secondary | ICD-10-CM | POA: Insufficient documentation

## 2016-07-22 DIAGNOSIS — I70203 Unspecified atherosclerosis of native arteries of extremities, bilateral legs: Secondary | ICD-10-CM | POA: Diagnosis not present

## 2016-07-22 DIAGNOSIS — I10 Essential (primary) hypertension: Secondary | ICD-10-CM | POA: Diagnosis not present

## 2016-07-22 LAB — NM MYOCAR MULTI W/SPECT W/WALL MOTION / EF
CHL CUP NUCLEAR SRS: 5
CHL CUP RESTING HR STRESS: 63 {beats}/min
LHR: 0.41
LV dias vol: 70 mL (ref 46–106)
LV sys vol: 26 mL
NUC STRESS TID: 1.67
Peak HR: 84 {beats}/min
SDS: 1
SSS: 6

## 2016-07-22 MED ORDER — SODIUM CHLORIDE 0.9% FLUSH
INTRAVENOUS | Status: AC
  Administered 2016-07-22: 10 mL via INTRAVENOUS
  Filled 2016-07-22: qty 10

## 2016-07-22 MED ORDER — SODIUM CHLORIDE 0.9% FLUSH
INTRAVENOUS | Status: AC
  Filled 2016-07-22: qty 180

## 2016-07-22 MED ORDER — REGADENOSON 0.4 MG/5ML IV SOLN
INTRAVENOUS | Status: AC
  Administered 2016-07-22: 0.4 mg via INTRAVENOUS
  Filled 2016-07-22: qty 5

## 2016-07-22 MED ORDER — TECHNETIUM TC 99M TETROFOSMIN IV KIT
10.0000 | PACK | Freq: Once | INTRAVENOUS | Status: AC | PRN
  Administered 2016-07-22: 10 via INTRAVENOUS

## 2016-07-22 MED ORDER — TECHNETIUM TC 99M TETROFOSMIN IV KIT
30.0000 | PACK | Freq: Once | INTRAVENOUS | Status: AC | PRN
  Administered 2016-07-22: 30 via INTRAVENOUS

## 2016-07-26 ENCOUNTER — Telehealth: Payer: Self-pay | Admitting: *Deleted

## 2016-07-26 NOTE — Telephone Encounter (Signed)
Called patient with test results. No answer. Left message to call back.  

## 2016-07-26 NOTE — Telephone Encounter (Signed)
-----   Message from Massie Maroon, [place] sent at 07/23/2016  1:04 PM EDT -----   ----- Message ----- From: Arnoldo Lenis, MD Sent: 07/23/2016  12:25 PM To: Staci T Ashworth, CMA  Stress test shows some mild abnormalities suggesting a possible blockage. Id like to talk to her about the findings in more detail and discuss our options. Can she see me Tuesday at the 320pm slot which ever office im at.

## 2016-07-28 ENCOUNTER — Encounter: Payer: Self-pay | Admitting: Cardiology

## 2016-07-28 ENCOUNTER — Ambulatory Visit (INDEPENDENT_AMBULATORY_CARE_PROVIDER_SITE_OTHER): Payer: BLUE CROSS/BLUE SHIELD | Admitting: Cardiology

## 2016-07-28 ENCOUNTER — Encounter: Payer: Self-pay | Admitting: *Deleted

## 2016-07-28 ENCOUNTER — Telehealth: Payer: Self-pay | Admitting: Cardiology

## 2016-07-28 VITALS — BP 121/70 | HR 80 | Ht 66.0 in | Wt 160.0 lb

## 2016-07-28 DIAGNOSIS — Z01818 Encounter for other preprocedural examination: Secondary | ICD-10-CM | POA: Diagnosis not present

## 2016-07-28 DIAGNOSIS — I10 Essential (primary) hypertension: Secondary | ICD-10-CM | POA: Diagnosis not present

## 2016-07-28 DIAGNOSIS — R0789 Other chest pain: Secondary | ICD-10-CM

## 2016-07-28 DIAGNOSIS — R9439 Abnormal result of other cardiovascular function study: Secondary | ICD-10-CM

## 2016-07-28 DIAGNOSIS — I739 Peripheral vascular disease, unspecified: Secondary | ICD-10-CM | POA: Diagnosis not present

## 2016-07-28 MED ORDER — METOPROLOL TARTRATE 25 MG PO TABS
12.5000 mg | ORAL_TABLET | Freq: Two times a day (BID) | ORAL | 1 refills | Status: DC
Start: 2016-07-28 — End: 2017-01-22

## 2016-07-28 NOTE — Telephone Encounter (Signed)
Pre-cert Verification for the following procedure    LHC AND LE ARTERIAL CATH DR END 08/02/16

## 2016-07-28 NOTE — Progress Notes (Signed)
Clinical Summary Ms. Selvage is a 61 y.o.female  1. Chest pain - symptoms started about 3 months. Sharp pain left, 7/10 that occurred at rest. Can have some palpitations at times. Lasted a few hours. Better with ibuprofen and pain pill. Worst with deep breath. Total of 20 episodes.  - can have some DOE with walking inclines    - 07/2016 nuclear stress test shows small moderate intensity apical/anteroseptal defect partially reversible, tid 1.67. Overall high risk study.  - last episode of pain about 2 weeks ago   2. Carotid stenosis - history of prior left CEA - history of prior stroke.  - followed by vascular  3. DM2 - followed by pcp. Compliant with oral regimen  4. HTN - compliant with meds  5. Leg pains - pain in bilateral calves with walking, left greater than right. Pain with 1/2 block, worst with incline. Better with rest - ABIs report looks to be mislabled as upper extremity report. ABIs show mild to moderate disease. Right sided 0.8, left sided 0.65   6. Hyperlipidemia - muscle aches on atorvastatin. Crestor causes muscle aches. Tried on several others Past Medical History:  Diagnosis Date  . Cervicalgia   . Chest pain, unspecified   . GERD (gastroesophageal reflux disease)   . Heart murmur   . Heavy smoker (more than 20 cigarettes per day)    Scheduled for LDCT screening 02/11/15  . Hepatitis A    "related to [employer]"  . History of hiatal hernia   . Hypercholesteremia   . Hypertension   . Kidney stones    "no cysto/OR" (12/10/2014)  . Obesity   . Reflux esophagitis   . Stroke [facility])    "they said I had a silent stroke a few months back/MRI" (12/10/2014)  . TIA (transient ischemic attack) 11/28/2014   [name] 11/28/2014  . Type II diabetes mellitus (HCC)      Allergies  Allergen Reactions  . Codeine Shortness Of Breath  . Latex Itching  . Sulfa Antibiotics Other (See Comments)    Reaction:  Unknown      Current Outpatient Prescriptions    Medication Sig Dispense Refill  . acetaminophen (TYLENOL) 500 MG tablet Take 500-1,000 mg by mouth every 6 (six) hours as needed for moderate pain.    Marland Kitchen ALPRAZolam (XANAX) 1 MG tablet Take 0.5 mg by mouth 3 (three) times daily as needed.     Marland Kitchen aspirin EC 81 MG tablet Take 1 tablet (81 mg total) by mouth daily. Please continue Aspirin and plavix for 6 months, then plavix daily 30 tablet 6  . clopidogrel (PLAVIX) 75 MG tablet Take 1 tablet (75 mg total) by mouth daily. 30 tablet 3  . dexlansoprazole (DEXILANT) 60 MG capsule Take 1 capsule (60 mg total) by mouth daily. 30 capsule 11  . dicyclomine (BENTYL) 10 MG capsule Take 1 capsule (10 mg total) by mouth 2 (two) times daily as needed for spasms. 90 capsule 3  . gabapentin (NEURONTIN) 300 MG capsule Take 300 mg by mouth 2 (two) times daily.     Marland Kitchen lisinopril (PRINIVIL,ZESTRIL) 20 MG tablet Take 0.5 tablets (10 mg total) by mouth daily. (Patient taking differently: Take 20 mg by mouth daily. ) 30 tablet 0  . metFORMIN (GLUCOPHAGE) 1000 MG tablet Take 1 tablet (1,000 mg total) by mouth 2 (two) times daily with a meal. 60 tablet 3  . ondansetron (ZOFRAN) 4 MG tablet TAKE 1 TABLET(4 MG) BY MOUTH TWICE DAILY AS  NEEDED FOR NAUSEA OR VOMITING 20 tablet 1  . oxyCODONE (OXY IR/ROXICODONE) 5 MG immediate release tablet Take 1 tablet by mouth every 4 (four) hours as needed for moderate pain.   0   No current facility-administered medications for this visit.      Past Surgical History:  Procedure Laterality Date  . CHOLECYSTECTOMY OPEN  1978  . ENDARTERECTOMY Left 12/02/2014   Procedure: ENDARTERECTOMY CAROTID;  Surgeon: Rosetta Posner, MD;  Location: [place];  Service: Vascular;  Laterality: Left;  Marland Kitchen VAGINAL HYSTERECTOMY  1991   "partial"     Allergies  Allergen Reactions  . Codeine Shortness Of Breath  . Latex Itching  . Sulfa Antibiotics Other (See Comments)    Reaction:  Unknown       Family History  Problem Relation Age of Onset  .  Congestive Heart Failure Mother   . COPD Father   . Cancer Brother   . Cancer Brother      Social History Ms. Hesler reports that she has been smoking Cigarettes.  She has a 82.00 pack-year smoking history. She has never used smokeless tobacco. Ms. Brasel reports that she does not drink alcohol.   Review of Systems CONSTITUTIONAL: No weight loss, fever, chills, weakness or fatigue.  HEENT: Eyes: No visual loss, blurred vision, double vision or yellow sclerae.No hearing loss, sneezing, congestion, runny nose or sore throat.  SKIN: No rash or itching.  CARDIOVASCULAR: per HPI RESPIRATORY: No shortness of breath, cough or sputum.  GASTROINTESTINAL: No anorexia, nausea, vomiting or diarrhea. No abdominal pain or blood.  GENITOURINARY: No burning on urination, no polyuria NEUROLOGICAL: No headache, dizziness, syncope, paralysis, ataxia, numbness or tingling in the extremities. No change in bowel or bladder control.  MUSCULOSKELETAL: per HPI LYMPHATICS: No enlarged nodes. No history of splenectomy.  PSYCHIATRIC: No history of depression or anxiety.  ENDOCRINOLOGIC: No reports of sweating, cold or heat intolerance. No polyuria or polydipsia.  Marland Kitchen   Physical Examination Vitals:   07/28/16 1454  BP: 121/70  Pulse: 80   Vitals:   07/28/16 1454  Weight: 160 lb (72.6 kg)  Height: 5\' 6"  (1.676 m)    Gen: resting comfortably, no acute distress HEENT: no scleral icterus, pupils equal round and reactive, no palptable cervical adenopathy,  CV: RRR, no m/r/g, no jvd Resp: Clear to auscultation bilaterally GI: abdomen is soft, non-tender, non-distended, normal bowel sounds, no hepatosplenomegaly MSK: extremities are warm, no edema.  Skin: warm, no rash Neuro:  no focal deficits Psych: appropriate affect   Diagnostic Studies 11/2014 echo Study Conclusions  - Left ventricle: The cavity size was normal. There was mild focal basal hypertrophy of the septum. Systolic function was  normal. The estimated ejection fraction was in the range of 60% to 65%. Wall motion was normal; there were no regional wall motion abnormalities. Doppler parameters are consistent with abnormal left ventricular relaxation (grade 1 diastolic dysfunction). - Aortic valve: Trileaflet; mildly calcified leaflets. There was trivial regurgitation. - Mitral valve: Calcified annulus. - Right atrium: Central venous pressure (est): 3 mm Hg. - Atrial septum: There was increased thickness of the septum, consistent with lipomatous hypertrophy. No defect or patent foramen ovale was identified. - Tricuspid valve: There was trivial regurgitation. - Pulmonary arteries: Systolic pressure could not be accurately estimated. - Pericardium, extracardiac: A prominent pericardial fat pad was present.  Impressions:  - Mild basal septal LV hypertrophy with LVEF 60-65% and grade 1 diastolic dysfunction. Mildly calcified mitral annulus as well as mildly sclerotic  aortic valve. Trivial aortic regurgitation. No obvious PFO or ASD.  07/2016 Nuclear stress  No diagnostic ST segment changes to indicate ischemia with Lexiscan.  Small, moderate intensity, apical anteroseptal defect is partially reversible suggestive of a mild ischemic territory. Thr TID ratio is also significantly increased at 1.67 suggesting the possibility of subendocardial or balanced multivessel ischemia.  This is a high risk study based predominantly on the significantly elevated TID ratio.  Nuclear stress EF: 63%.   Assessment and Plan  1. Chest pain  - atypical symptoms, however given her DM2 she is at risk for atypical angina. Multiple CAD risk factors including known carotid stenosis, DM2, HTN, HL, tobacco abuse, and family history of CAD. Multiple of her children have had advanced CAD and PAD  - abnormal nuclear stress test with anteroseptal ischemia, elevated tid, overall high risk.  - start lopressor 12.5mg   bid as antianginal - we will refer for cath. Pending dye load for cardiac cath, given her claudication and abnormal ABIs will ask for lower extremity angiography at discrection of Dr End depending on initial cardiac findings and dye load  2. HTN - at goal, continue current meds  3. Hyperlipidemia - intolerant to statins. Requests labs from pcp, may consider zetia or praluent in the very near future.   4. Leg pains - typical claudication symptoms, decreased pulses on exam,abnormal ABIs - will ask for lower extremity angiography during cardiac cath as well at discretion of Dr End depending on dye load and initial cardiac findings.   5. DM2 - she is on ASA, ACE-I. Statin intolerant.    I have reviewed the risks, indications, and alternatives to cardiac catheterization, possible angioplasty, and stenting with the patient  today. Risks include but are not limited to bleeding, infection, vascular injury, stroke, myocardial infection, arrhythmia, kidney injury, radiation-related injury in the case of prolonged fluoroscopy use, emergency cardiac surgery, and death. The patient understands the risks of serious complication is 1-2 in 8309 with diagnostic cardiac cath and 1-2% or less with angioplasty/stenting.     F/u 1 month      Arnoldo Lenis, M.D.

## 2016-07-28 NOTE — Patient Instructions (Addendum)
Your physician recommends that you schedule a follow-up appointment in: [place] has recommended you make the following change in your medication:   START METOPROLOL 12.5 MG TWICE DAILY  Your physician has requested that you have a cardiac catheterization and lower arterial catheterization. Cardiac catheterization is used to diagnose and/or treat various heart conditions. Doctors may recommend this procedure for a number of different reasons. The most common reason is to evaluate chest pain. Chest pain can be a symptom of coronary artery disease (CAD), and cardiac catheterization can show whether plaque is narrowing or blocking your heart's arteries. This procedure is also used to evaluate the valves, as well as measure the blood flow and oxygen levels in different parts of your heart. For further information please visit [email or web address]. Please follow instruction sheet, as given.  Thank you for choosing [facility]!!

## 2016-07-29 ENCOUNTER — Other Ambulatory Visit: Payer: Self-pay | Admitting: Cardiology

## 2016-07-29 DIAGNOSIS — Z01818 Encounter for other preprocedural examination: Secondary | ICD-10-CM | POA: Diagnosis not present

## 2016-07-29 LAB — BASIC METABOLIC PANEL
BUN: 20 mg/dL (ref 7–25)
CHLORIDE: 104 mmol/L (ref 98–110)
CO2: 23 mmol/L (ref 20–31)
Calcium: 9.6 mg/dL (ref 8.6–10.4)
Creat: 1.1 mg/dL — ABNORMAL HIGH (ref 0.50–0.99)
Glucose, Bld: 219 mg/dL — ABNORMAL HIGH (ref 65–99)
Potassium: 5.1 mmol/L (ref 3.5–5.3)
Sodium: 139 mmol/L (ref 135–146)

## 2016-07-29 LAB — PROTIME-INR
INR: 1
Prothrombin Time: 10.2 s (ref 9.0–11.5)

## 2016-07-29 LAB — CBC
HEMATOCRIT: 39.2 % (ref 35.0–45.0)
Hemoglobin: 13 g/dL (ref 11.7–15.5)
MCH: 30 pg (ref 27.0–33.0)
MCHC: 33.2 g/dL (ref 32.0–36.0)
MCV: 90.3 fL (ref 80.0–100.0)
MPV: 9.3 fL (ref 7.5–12.5)
Platelets: 259 10*3/uL (ref 140–400)
RBC: 4.34 MIL/uL (ref 3.80–5.10)
RDW: 14 % (ref 11.0–15.0)
WBC: 9.2 10*3/uL (ref 3.8–10.8)

## 2016-07-29 NOTE — Telephone Encounter (Signed)
Pt has Grace Hess.  No precert required for heart cath or LE

## 2016-07-30 ENCOUNTER — Telehealth: Payer: Self-pay

## 2016-07-30 NOTE — Telephone Encounter (Signed)
-----   Message from Arnoldo Lenis, MD sent at 07/30/2016 12:25 PM EDT ----- Labs look good  Zandra Abts MD

## 2016-07-30 NOTE — Telephone Encounter (Signed)
Patient notified. Routed to PCP 

## 2016-08-02 ENCOUNTER — Encounter (HOSPITAL_COMMUNITY)
Admission: RE | Disposition: A | Discharge status: Home / Self Care | Payer: Self-pay | Source: Ambulatory Visit | Attending: Internal Medicine

## 2016-08-02 ENCOUNTER — Ambulatory Visit (HOSPITAL_COMMUNITY)
Admission: RE | Admit: 2016-08-02 | Discharge: 2016-08-03 | Disposition: A | Discharge status: Home / Self Care | Payer: BLUE CROSS/BLUE SHIELD | Source: Ambulatory Visit | Attending: Internal Medicine | Admitting: Internal Medicine

## 2016-08-02 ENCOUNTER — Encounter (HOSPITAL_COMMUNITY): Payer: Self-pay | Admitting: General Practice

## 2016-08-02 DIAGNOSIS — E1151 Type 2 diabetes mellitus with diabetic peripheral angiopathy without gangrene: Secondary | ICD-10-CM | POA: Diagnosis not present

## 2016-08-02 DIAGNOSIS — R079 Chest pain, unspecified: Secondary | ICD-10-CM | POA: Diagnosis present

## 2016-08-02 DIAGNOSIS — I208 Other forms of angina pectoris: Secondary | ICD-10-CM | POA: Diagnosis present

## 2016-08-02 DIAGNOSIS — I25110 Atherosclerotic heart disease of native coronary artery with unstable angina pectoris: Secondary | ICD-10-CM

## 2016-08-02 DIAGNOSIS — F17210 Nicotine dependence, cigarettes, uncomplicated: Secondary | ICD-10-CM | POA: Insufficient documentation

## 2016-08-02 DIAGNOSIS — K219 Gastro-esophageal reflux disease without esophagitis: Secondary | ICD-10-CM | POA: Diagnosis not present

## 2016-08-02 DIAGNOSIS — Z6825 Body mass index (BMI) 25.0-25.9, adult: Secondary | ICD-10-CM | POA: Diagnosis not present

## 2016-08-02 DIAGNOSIS — Z7982 Long term (current) use of aspirin: Secondary | ICD-10-CM | POA: Diagnosis not present

## 2016-08-02 DIAGNOSIS — I2510 Atherosclerotic heart disease of native coronary artery without angina pectoris: Secondary | ICD-10-CM

## 2016-08-02 DIAGNOSIS — Z882 Allergy status to sulfonamides status: Secondary | ICD-10-CM | POA: Insufficient documentation

## 2016-08-02 DIAGNOSIS — I2584 Coronary atherosclerosis due to calcified coronary lesion: Secondary | ICD-10-CM | POA: Insufficient documentation

## 2016-08-02 DIAGNOSIS — Z91040 Latex allergy status: Secondary | ICD-10-CM | POA: Insufficient documentation

## 2016-08-02 DIAGNOSIS — I6522 Occlusion and stenosis of left carotid artery: Secondary | ICD-10-CM | POA: Diagnosis not present

## 2016-08-02 DIAGNOSIS — Z7902 Long term (current) use of antithrombotics/antiplatelets: Secondary | ICD-10-CM | POA: Diagnosis not present

## 2016-08-02 DIAGNOSIS — R9439 Abnormal result of other cardiovascular function study: Secondary | ICD-10-CM | POA: Diagnosis present

## 2016-08-02 DIAGNOSIS — Z8673 Personal history of transient ischemic attack (TIA), and cerebral infarction without residual deficits: Secondary | ICD-10-CM | POA: Diagnosis not present

## 2016-08-02 DIAGNOSIS — Z7984 Long term (current) use of oral hypoglycemic drugs: Secondary | ICD-10-CM | POA: Insufficient documentation

## 2016-08-02 DIAGNOSIS — R0789 Other chest pain: Secondary | ICD-10-CM

## 2016-08-02 DIAGNOSIS — Z8249 Family history of ischemic heart disease and other diseases of the circulatory system: Secondary | ICD-10-CM | POA: Insufficient documentation

## 2016-08-02 DIAGNOSIS — E119 Type 2 diabetes mellitus without complications: Secondary | ICD-10-CM

## 2016-08-02 DIAGNOSIS — Z955 Presence of coronary angioplasty implant and graft: Secondary | ICD-10-CM

## 2016-08-02 DIAGNOSIS — I739 Peripheral vascular disease, unspecified: Secondary | ICD-10-CM | POA: Diagnosis present

## 2016-08-02 DIAGNOSIS — Z9861 Coronary angioplasty status: Secondary | ICD-10-CM

## 2016-08-02 DIAGNOSIS — I70213 Atherosclerosis of native arteries of extremities with intermittent claudication, bilateral legs: Secondary | ICD-10-CM | POA: Insufficient documentation

## 2016-08-02 DIAGNOSIS — I700 Atherosclerosis of aorta: Secondary | ICD-10-CM | POA: Diagnosis not present

## 2016-08-02 DIAGNOSIS — I7092 Chronic total occlusion of artery of the extremities: Secondary | ICD-10-CM | POA: Insufficient documentation

## 2016-08-02 DIAGNOSIS — E669 Obesity, unspecified: Secondary | ICD-10-CM | POA: Insufficient documentation

## 2016-08-02 DIAGNOSIS — I2089 Other forms of angina pectoris: Secondary | ICD-10-CM | POA: Diagnosis present

## 2016-08-02 DIAGNOSIS — F17200 Nicotine dependence, unspecified, uncomplicated: Secondary | ICD-10-CM | POA: Diagnosis present

## 2016-08-02 DIAGNOSIS — I25118 Atherosclerotic heart disease of native coronary artery with other forms of angina pectoris: Secondary | ICD-10-CM | POA: Insufficient documentation

## 2016-08-02 DIAGNOSIS — E7800 Pure hypercholesterolemia, unspecified: Secondary | ICD-10-CM | POA: Diagnosis not present

## 2016-08-02 DIAGNOSIS — I10 Essential (primary) hypertension: Secondary | ICD-10-CM | POA: Diagnosis not present

## 2016-08-02 HISTORY — PX: LOWER EXTREMITY ANGIOGRAM: SHX5955

## 2016-08-02 HISTORY — PX: CORONARY ANGIOPLASTY WITH STENT PLACEMENT: SHX49

## 2016-08-02 HISTORY — DX: Anxiety disorder, unspecified: F41.9

## 2016-08-02 HISTORY — DX: Headache, unspecified: R51.9

## 2016-08-02 HISTORY — PX: CORONARY STENT INTERVENTION: CATH118234

## 2016-08-02 HISTORY — PX: ABDOMINAL AORTOGRAM: CATH118222

## 2016-08-02 HISTORY — PX: LEFT HEART CATH AND CORONARY ANGIOGRAPHY: CATH118249

## 2016-08-02 HISTORY — DX: Personal history of urinary calculi: Z87.442

## 2016-08-02 HISTORY — PX: LOWER EXTREMITY ANGIOGRAPHY: CATH118251

## 2016-08-02 HISTORY — DX: Unspecified osteoarthritis, unspecified site: M19.90

## 2016-08-02 HISTORY — DX: Headache: R51

## 2016-08-02 LAB — POCT ACTIVATED CLOTTING TIME
ACTIVATED CLOTTING TIME: 191 s
ACTIVATED CLOTTING TIME: 125 s
Activated Clotting Time: 406 seconds
Activated Clotting Time: 224 seconds

## 2016-08-02 LAB — CREATININE, SERUM
CREATININE: 1.19 mg/dL — AB (ref 0.44–1.00)
GFR calc non Af Amer: 48 mL/min — ABNORMAL LOW (ref >60)
GFR, EST AFRICAN AMERICAN: 56 mL/min — AB (ref >60)

## 2016-08-02 LAB — GLUCOSE, CAPILLARY
GLUCOSE-CAPILLARY: 251 mg/dL — AB (ref 65–99)
Glucose-Capillary: 256 mg/dL — ABNORMAL HIGH (ref 65–99)
Glucose-Capillary: 164 mg/dL — ABNORMAL HIGH (ref 65–99)
Glucose-Capillary: 134 mg/dL — ABNORMAL HIGH (ref 65–99)
Glucose-Capillary: 194 mg/dL — ABNORMAL HIGH (ref 65–99)

## 2016-08-02 LAB — CBC
HCT: 37.1 % (ref 36.0–46.0)
Hemoglobin: 12 g/dL (ref 12.0–15.0)
MCH: 29.9 pg (ref 26.0–34.0)
MCHC: 32.3 g/dL (ref 30.0–36.0)
MCV: 92.3 fL (ref 78.0–100.0)
Platelets: 231 10*3/uL (ref 150–400)
RBC: 4.02 MIL/uL (ref 3.87–5.11)
RDW: 14 % (ref 11.5–15.5)
WBC: 10.2 10*3/uL (ref 4.0–10.5)

## 2016-08-02 SURGERY — LOWER EXTREMITY ANGIOGRAPHY
Anesthesia: LOCAL

## 2016-08-02 MED ORDER — SODIUM CHLORIDE 0.9% FLUSH: 3.0000 mL | INTRAVENOUS | Status: DC | PRN

## 2016-08-02 MED ORDER — SODIUM CHLORIDE 0.9 % IV SOLN: 250.0000 mL | INTRAVENOUS | Status: DC | PRN

## 2016-08-02 MED ORDER — NITROGLYCERIN 1 MG/10 ML FOR IR/CATH LAB
INTRA_ARTERIAL | Status: AC
  Filled 2016-08-02: qty 10

## 2016-08-02 MED ORDER — ALPRAZOLAM 0.25 MG PO TABS
0.5000 mg | ORAL_TABLET | Freq: Three times a day (TID) | ORAL | Status: DC | PRN
  Administered 2016-08-02: 13:00:00 0.5 mg via ORAL
  Filled 2016-08-02: qty 2

## 2016-08-02 MED ORDER — LABETALOL HCL 5 MG/ML IV SOLN: 10.0000 mg | INTRAVENOUS | Status: AC | PRN

## 2016-08-02 MED ORDER — HEPARIN (PORCINE) IN NACL 2-0.9 UNIT/ML-% IJ SOLN
INTRAMUSCULAR | Status: AC | PRN
  Administered 2016-08-02: 1000 mL

## 2016-08-02 MED ORDER — OXYCODONE HCL 5 MG PO TABS
5.0000 mg | ORAL_TABLET | ORAL | Status: DC | PRN
  Administered 2016-08-02: 11:00:00 5 mg via ORAL
  Filled 2016-08-02: qty 1

## 2016-08-02 MED ORDER — SODIUM CHLORIDE 0.9% FLUSH
3.0000 mL | Freq: Two times a day (BID) | INTRAVENOUS | Status: DC
  Administered 2016-08-02: 3 mL via INTRAVENOUS

## 2016-08-02 MED ORDER — BIVALIRUDIN BOLUS VIA INFUSION - CUPID
INTRAVENOUS | Status: DC | PRN
  Administered 2016-08-02: 54.45 mg via INTRAVENOUS

## 2016-08-02 MED ORDER — ASPIRIN 81 MG PO CHEW: 81.0000 mg | CHEWABLE_TABLET | ORAL | Status: DC

## 2016-08-02 MED ORDER — ACETAMINOPHEN 325 MG PO TABS: 650.0000 mg | ORAL_TABLET | ORAL | Status: DC | PRN

## 2016-08-02 MED ORDER — ASPIRIN EC 81 MG PO TBEC
81.0000 mg | DELAYED_RELEASE_TABLET | Freq: Every day | ORAL | Status: DC
  Filled 2016-08-02: qty 1

## 2016-08-02 MED ORDER — GABAPENTIN 300 MG PO CAPS
300.0000 mg | ORAL_CAPSULE | Freq: Two times a day (BID) | ORAL | Status: DC
  Administered 2016-08-02 – 2016-08-03 (×2): 300 mg via ORAL
  Filled 2016-08-02 (×2): qty 1

## 2016-08-02 MED ORDER — METOPROLOL TARTRATE 12.5 MG HALF TABLET
12.5000 mg | ORAL_TABLET | Freq: Two times a day (BID) | ORAL | Status: DC
  Administered 2016-08-02 – 2016-08-03 (×2): 12.5 mg via ORAL
  Filled 2016-08-02 (×2): qty 1

## 2016-08-02 MED ORDER — FENTANYL CITRATE (PF) 100 MCG/2ML IJ SOLN
INTRAMUSCULAR | Status: AC
  Filled 2016-08-02: qty 2

## 2016-08-02 MED ORDER — MIDAZOLAM HCL 2 MG/2ML IJ SOLN
INTRAMUSCULAR | Status: AC
  Filled 2016-08-02: qty 2

## 2016-08-02 MED ORDER — CLOPIDOGREL BISULFATE 300 MG PO TABS
ORAL_TABLET | ORAL | Status: DC | PRN
  Administered 2016-08-02: 300 mg via ORAL

## 2016-08-02 MED ORDER — HYDRALAZINE HCL 20 MG/ML IJ SOLN
5.0000 mg | INTRAMUSCULAR | Status: AC | PRN
  Administered 2016-08-02: 5 mg via INTRAVENOUS
  Filled 2016-08-02: qty 1

## 2016-08-02 MED ORDER — HEPARIN SODIUM (PORCINE) 5000 UNIT/ML IJ SOLN
5000.0000 [IU] | Freq: Three times a day (TID) | INTRAMUSCULAR | Status: DC
  Filled 2016-08-02: qty 1

## 2016-08-02 MED ORDER — CLOPIDOGREL BISULFATE 300 MG PO TABS
ORAL_TABLET | ORAL | Status: AC
  Filled 2016-08-02: qty 1

## 2016-08-02 MED ORDER — SODIUM CHLORIDE 0.9 % IV SOLN
INTRAVENOUS | Status: DC | PRN
  Administered 2016-08-02: 1.75 mg/kg/h via INTRAVENOUS

## 2016-08-02 MED ORDER — LIDOCAINE HCL (PF) 1 % IJ SOLN
INTRAMUSCULAR | Status: DC | PRN
  Administered 2016-08-02: 8 mL

## 2016-08-02 MED ORDER — BIVALIRUDIN TRIFLUOROACETATE 250 MG IV SOLR
INTRAVENOUS | Status: AC
  Filled 2016-08-02: qty 250

## 2016-08-02 MED ORDER — PANTOPRAZOLE SODIUM 40 MG PO TBEC
40.0000 mg | DELAYED_RELEASE_TABLET | Freq: Every day | ORAL | Status: DC
  Administered 2016-08-02 – 2016-08-03 (×2): 40 mg via ORAL
  Filled 2016-08-02 (×2): qty 1

## 2016-08-02 MED ORDER — SODIUM CHLORIDE 0.9 % WEIGHT BASED INFUSION
3.0000 mL/kg/h | INTRAVENOUS | Status: DC
  Administered 2016-08-02: 3 mL/kg/h via INTRAVENOUS

## 2016-08-02 MED ORDER — ANGIOPLASTY BOOK
Freq: Once | Status: AC
  Administered 2016-08-02: 20:00:00
  Filled 2016-08-02: qty 1

## 2016-08-02 MED ORDER — CLOPIDOGREL BISULFATE 75 MG PO TABS
75.0000 mg | ORAL_TABLET | Freq: Every day | ORAL | Status: DC
  Administered 2016-08-03: 10:00:00 75 mg via ORAL
  Filled 2016-08-02: qty 1

## 2016-08-02 MED ORDER — IODIXANOL 320 MG/ML IV SOLN
INTRAVENOUS | Status: DC | PRN
  Administered 2016-08-02: 170 mL via INTRA_ARTERIAL

## 2016-08-02 MED ORDER — SODIUM CHLORIDE 0.9 % WEIGHT BASED INFUSION: 1.0000 mL/kg/h | INTRAVENOUS | Status: DC

## 2016-08-02 MED ORDER — INSULIN ASPART 100 UNIT/ML ~~LOC~~ SOLN
0.0000 [IU] | Freq: Three times a day (TID) | SUBCUTANEOUS | Status: DC
  Administered 2016-08-02: 18:00:00 8 [IU] via SUBCUTANEOUS
  Administered 2016-08-03: 06:00:00 3 [IU] via SUBCUTANEOUS

## 2016-08-02 MED ORDER — ATORVASTATIN CALCIUM 80 MG PO TABS
80.0000 mg | ORAL_TABLET | Freq: Every day | ORAL | Status: DC
  Administered 2016-08-02: 18:00:00 80 mg via ORAL
  Filled 2016-08-02: qty 1

## 2016-08-02 MED ORDER — MIDAZOLAM HCL 2 MG/2ML IJ SOLN
INTRAMUSCULAR | Status: DC | PRN
  Administered 2016-08-02: 1 mg via INTRAVENOUS

## 2016-08-02 MED ORDER — NITROGLYCERIN 1 MG/10 ML FOR IR/CATH LAB
INTRA_ARTERIAL | Status: DC | PRN
  Administered 2016-08-02 (×2): 200 ug via INTRACORONARY

## 2016-08-02 MED ORDER — LISINOPRIL 5 MG PO TABS
5.0000 mg | ORAL_TABLET | Freq: Every day | ORAL | Status: DC
  Administered 2016-08-03: 10:00:00 5 mg via ORAL
  Filled 2016-08-02: qty 1

## 2016-08-02 MED ORDER — INSULIN ASPART 100 UNIT/ML ~~LOC~~ SOLN: 0.0000 [IU] | Freq: Every day | SUBCUTANEOUS | Status: DC

## 2016-08-02 MED ORDER — SODIUM CHLORIDE 0.9% FLUSH: 3.0000 mL | Freq: Two times a day (BID) | INTRAVENOUS | Status: DC

## 2016-08-02 MED ORDER — SODIUM CHLORIDE 0.9 % IV SOLN
INTRAVENOUS | Status: AC
  Administered 2016-08-02: 11:00:00 via INTRAVENOUS

## 2016-08-02 MED ORDER — FENTANYL CITRATE (PF) 100 MCG/2ML IJ SOLN
INTRAMUSCULAR | Status: DC | PRN
  Administered 2016-08-02: 25 ug via INTRAVENOUS
  Administered 2016-08-02: 50 ug via INTRAVENOUS

## 2016-08-02 MED ORDER — ONDANSETRON HCL 4 MG/2ML IJ SOLN: 4.0000 mg | Freq: Four times a day (QID) | INTRAMUSCULAR | Status: DC | PRN

## 2016-08-02 MED ORDER — LIDOCAINE HCL 1 % IJ SOLN
INTRAMUSCULAR | Status: AC
  Filled 2016-08-02: qty 20

## 2016-08-02 SURGICAL SUPPLY — 24 items
BALLN MOZEC 2.25X14 (BALLOONS) ×2
BALLN ~~LOC~~ EUPHORA RX 2.75X12 (BALLOONS) ×2
BALLOON MOZEC 2.25X14 (BALLOONS) IMPLANT
BALLOON ~~LOC~~ EUPHORA RX 2.75X12 (BALLOONS) IMPLANT
CATH INFINITI 5FR MULTPACK ANG (CATHETERS) ×1 IMPLANT
CATH LAUNCHER 6FR JR4 (CATHETERS) ×1 IMPLANT
COVER PRB 48X5XTLSCP FOLD TPE (BAG) IMPLANT
COVER PROBE 5X48 (BAG) ×2
DEVICE CONTINUOUS FLUSH (MISCELLANEOUS) ×1 IMPLANT
KIT ENCORE 26 ADVANTAGE (KITS) ×1 IMPLANT
KIT HEART LEFT (KITS) ×2 IMPLANT
KIT MICROINTRODUCER STIFF 5F (SHEATH) ×1 IMPLANT
KIT PV (KITS) ×2 IMPLANT
PACK CARDIAC CATHETERIZATION (CUSTOM PROCEDURE TRAY) ×2 IMPLANT
SHEATH PINNACLE 5F 10CM (SHEATH) ×1 IMPLANT
SHEATH PINNACLE 6F 10CM (SHEATH) ×1 IMPLANT
STENT PROMUS PREM MR 2.5X16 (Permanent Stent) ×1 IMPLANT
STOPCOCK MORSE 400PSI 3WAY (MISCELLANEOUS) ×1 IMPLANT
SYRINGE MEDRAD AVANTA MACH 7 (SYRINGE) ×1 IMPLANT
TRANSDUCER W/STOPCOCK (MISCELLANEOUS) ×2 IMPLANT
TRAY PV CATH (CUSTOM PROCEDURE TRAY) ×2 IMPLANT
TUBING CIL FLEX 10 FLL-RA (TUBING) ×3 IMPLANT
WIRE HITORQ VERSACORE ST 145CM (WIRE) ×1 IMPLANT
WIRE RUNTHROUGH .014X180CM (WIRE) ×1 IMPLANT

## 2016-08-02 NOTE — H&P (View-Only) (Signed)
Clinical Summary Grace Hess is a 61 y.o.female  1. Chest pain - symptoms started about 3 months. Sharp pain left, 7/10 that occurred at rest. Can have some palpitations at times. Lasted a few hours. Better with ibuprofen and pain pill. Worst with deep breath. Total of 20 episodes.  - can have some DOE with walking inclines    - 07/2016 nuclear stress test shows small moderate intensity apical/anteroseptal defect partially reversible, tid 1.67. Overall high risk study.  - last episode of pain about 2 weeks ago   2. Carotid stenosis - history of prior left CEA - history of prior stroke.  - followed by vascular  3. DM2 - followed by pcp. Compliant with oral regimen  4. HTN - compliant with meds  5. Leg pains - pain in bilateral calves with walking, left greater than right. Pain with 1/2 block, worst with incline. Better with rest - ABIs report looks to be mislabled as upper extremity report. ABIs show mild to moderate disease. Right sided 0.8, left sided 0.65   6. Hyperlipidemia - muscle aches on atorvastatin. Crestor causes muscle aches. Tried on several others Past Medical History:  Diagnosis Date  . Cervicalgia   . Chest pain, unspecified   . GERD (gastroesophageal reflux disease)   . Heart murmur   . Heavy smoker (more than 20 cigarettes per day)    Scheduled for LDCT screening 02/11/15  . Hepatitis A    "related to [employer]"  . History of hiatal hernia   . Hypercholesteremia   . Hypertension   . Kidney stones    "no cysto/OR" (12/10/2014)  . Obesity   . Reflux esophagitis   . Stroke [facility])    "they said I had a silent stroke a few months back/MRI" (12/10/2014)  . TIA (transient ischemic attack) 11/28/2014   [name] 11/28/2014  . Type II diabetes mellitus (HCC)      Allergies  Allergen Reactions  . Codeine Shortness Of Breath  . Latex Itching  . Sulfa Antibiotics Other (See Comments)    Reaction:  Unknown      Current Outpatient Prescriptions    Medication Sig Dispense Refill  . acetaminophen (TYLENOL) 500 MG tablet Take 500-1,000 mg by mouth every 6 (six) hours as needed for moderate pain.    Marland Kitchen ALPRAZolam (XANAX) 1 MG tablet Take 0.5 mg by mouth 3 (three) times daily as needed.     Marland Kitchen aspirin EC 81 MG tablet Take 1 tablet (81 mg total) by mouth daily. Please continue Aspirin and plavix for 6 months, then plavix daily 30 tablet 6  . clopidogrel (PLAVIX) 75 MG tablet Take 1 tablet (75 mg total) by mouth daily. 30 tablet 3  . dexlansoprazole (DEXILANT) 60 MG capsule Take 1 capsule (60 mg total) by mouth daily. 30 capsule 11  . dicyclomine (BENTYL) 10 MG capsule Take 1 capsule (10 mg total) by mouth 2 (two) times daily as needed for spasms. 90 capsule 3  . gabapentin (NEURONTIN) 300 MG capsule Take 300 mg by mouth 2 (two) times daily.     Marland Kitchen lisinopril (PRINIVIL,ZESTRIL) 20 MG tablet Take 0.5 tablets (10 mg total) by mouth daily. (Patient taking differently: Take 20 mg by mouth daily. ) 30 tablet 0  . metFORMIN (GLUCOPHAGE) 1000 MG tablet Take 1 tablet (1,000 mg total) by mouth 2 (two) times daily with a meal. 60 tablet 3  . ondansetron (ZOFRAN) 4 MG tablet TAKE 1 TABLET(4 MG) BY MOUTH TWICE DAILY AS  NEEDED FOR NAUSEA OR VOMITING 20 tablet 1  . oxyCODONE (OXY IR/ROXICODONE) 5 MG immediate release tablet Take 1 tablet by mouth every 4 (four) hours as needed for moderate pain.   0   No current facility-administered medications for this visit.      Past Surgical History:  Procedure Laterality Date  . CHOLECYSTECTOMY OPEN  1978  . ENDARTERECTOMY Left 12/02/2014   Procedure: ENDARTERECTOMY CAROTID;  Surgeon: Rosetta Posner, MD;  Location: [place];  Service: Vascular;  Laterality: Left;  Marland Kitchen VAGINAL HYSTERECTOMY  1991   "partial"     Allergies  Allergen Reactions  . Codeine Shortness Of Breath  . Latex Itching  . Sulfa Antibiotics Other (See Comments)    Reaction:  Unknown       Family History  Problem Relation Age of Onset  .  Congestive Heart Failure Mother   . COPD Father   . Cancer Brother   . Cancer Brother      Social History Ms. Self reports that she has been smoking Cigarettes.  She has a 82.00 pack-year smoking history. She has never used smokeless tobacco. Ms. Bal reports that she does not drink alcohol.   Review of Systems CONSTITUTIONAL: No weight loss, fever, chills, weakness or fatigue.  HEENT: Eyes: No visual loss, blurred vision, double vision or yellow sclerae.No hearing loss, sneezing, congestion, runny nose or sore throat.  SKIN: No rash or itching.  CARDIOVASCULAR: per HPI RESPIRATORY: No shortness of breath, cough or sputum.  GASTROINTESTINAL: No anorexia, nausea, vomiting or diarrhea. No abdominal pain or blood.  GENITOURINARY: No burning on urination, no polyuria NEUROLOGICAL: No headache, dizziness, syncope, paralysis, ataxia, numbness or tingling in the extremities. No change in bowel or bladder control.  MUSCULOSKELETAL: per HPI LYMPHATICS: No enlarged nodes. No history of splenectomy.  PSYCHIATRIC: No history of depression or anxiety.  ENDOCRINOLOGIC: No reports of sweating, cold or heat intolerance. No polyuria or polydipsia.  Marland Kitchen   Physical Examination Vitals:   07/28/16 1454  BP: 121/70  Pulse: 80   Vitals:   07/28/16 1454  Weight: 160 lb (72.6 kg)  Height: 5\' 6"  (1.676 m)    Gen: resting comfortably, no acute distress HEENT: no scleral icterus, pupils equal round and reactive, no palptable cervical adenopathy,  CV: RRR, no m/r/g, no jvd Resp: Clear to auscultation bilaterally GI: abdomen is soft, non-tender, non-distended, normal bowel sounds, no hepatosplenomegaly MSK: extremities are warm, no edema.  Skin: warm, no rash Neuro:  no focal deficits Psych: appropriate affect   Diagnostic Studies 11/2014 echo Study Conclusions  - Left ventricle: The cavity size was normal. There was mild focal basal hypertrophy of the septum. Systolic function was  normal. The estimated ejection fraction was in the range of 60% to 65%. Wall motion was normal; there were no regional wall motion abnormalities. Doppler parameters are consistent with abnormal left ventricular relaxation (grade 1 diastolic dysfunction). - Aortic valve: Trileaflet; mildly calcified leaflets. There was trivial regurgitation. - Mitral valve: Calcified annulus. - Right atrium: Central venous pressure (est): 3 mm Hg. - Atrial septum: There was increased thickness of the septum, consistent with lipomatous hypertrophy. No defect or patent foramen ovale was identified. - Tricuspid valve: There was trivial regurgitation. - Pulmonary arteries: Systolic pressure could not be accurately estimated. - Pericardium, extracardiac: A prominent pericardial fat pad was present.  Impressions:  - Mild basal septal LV hypertrophy with LVEF 60-65% and grade 1 diastolic dysfunction. Mildly calcified mitral annulus as well as mildly sclerotic  aortic valve. Trivial aortic regurgitation. No obvious PFO or ASD.  07/2016 Nuclear stress  No diagnostic ST segment changes to indicate ischemia with Lexiscan.  Small, moderate intensity, apical anteroseptal defect is partially reversible suggestive of a mild ischemic territory. Thr TID ratio is also significantly increased at 1.67 suggesting the possibility of subendocardial or balanced multivessel ischemia.  This is a high risk study based predominantly on the significantly elevated TID ratio.  Nuclear stress EF: 63%.   Assessment and Plan  1. Chest pain  - atypical symptoms, however given her DM2 she is at risk for atypical angina. Multiple CAD risk factors including known carotid stenosis, DM2, HTN, HL, tobacco abuse, and family history of CAD. Multiple of her children have had advanced CAD and PAD  - abnormal nuclear stress test with anteroseptal ischemia, elevated tid, overall high risk.  - start lopressor 12.5mg   bid as antianginal - we will refer for cath. Pending dye load for cardiac cath, given her claudication and abnormal ABIs will ask for lower extremity angiography at discrection of Dr End depending on initial cardiac findings and dye load  2. HTN - at goal, continue current meds  3. Hyperlipidemia - intolerant to statins. Requests labs from pcp, may consider zetia or praluent in the very near future.   4. Leg pains - typical claudication symptoms, decreased pulses on exam,abnormal ABIs - will ask for lower extremity angiography during cardiac cath as well at discretion of Dr End depending on dye load and initial cardiac findings.   5. DM2 - she is on ASA, ACE-I. Statin intolerant.    I have reviewed the risks, indications, and alternatives to cardiac catheterization, possible angioplasty, and stenting with the patient  today. Risks include but are not limited to bleeding, infection, vascular injury, stroke, myocardial infection, arrhythmia, kidney injury, radiation-related injury in the case of prolonged fluoroscopy use, emergency cardiac surgery, and death. The patient understands the risks of serious complication is 1-2 in 3709 with diagnostic cardiac cath and 1-2% or less with angioplasty/stenting.     F/u 1 month      Arnoldo Lenis, M.D.

## 2016-08-02 NOTE — Progress Notes (Signed)
ACT 191 @ 1040

## 2016-08-02 NOTE — Brief Op Note (Signed)
Brief Cardiac Catheterization Note (Full Report to Follow)  Date: 08/02/2016 Time: 9:03 AM  PATIENT:  Grace Hess  61 y.o. female  PRE-OPERATIVE DIAGNOSIS:  leg pain, chest pain  POST-OPERATIVE DIAGNOSIS:  CAD and PVD  PROCEDURE:  Procedure(s) with comments: Lower Extremity Angiography (N/A) Left Heart Cath and Coronary Angiography (N/A) Coronary Stent Intervention (N/A) - RCA  SURGEON:  Surgeon(s) and Role:    * Lexie Koehl, MD - Primary  FINDINGS: 1. 2 vessel CAD, including 60% mid LAD and 90% proximal RCA stenoses. 2. Mildly elevated LVEDP. 3. CTO of proximal and mid left SFA. 4. 2-vessel runoff bilaterally. 5. Successful PCI to proximal RCA with placement of Promus Premier 2.5 x 16 mm DES (post-dilated to 2.8 mm) with 0% residual stenosis and TIMI-3 flow.  RECOMMENDATIONS: 1. Gentle hydration and overnight observation. 2. Aggressive secondary prevention, including smoking cessation. 3. If CP recurs, consider FFR-guided PCI to mid LAD. 4. Will review left SFA stenosis with PV team to determine best approach for revascularization (endovascular vs fem-pop bypass).  Nelva Bush, MD [facility] Pager: [phone]

## 2016-08-02 NOTE — Interval H&P Note (Signed)
History and Physical Interval Note:  08/02/2016 7:26 AM  Grace Hess  has presented today for cardiac catheterization and peripheral angiography, with the diagnosis of chest pain, abnormal stress test, and claudication. The various methods of treatment have been discussed with the patient and family. After consideration of risks, benefits and other options for treatment, the patient has consented to  Procedure(s): Lower Extremity Angiography (N/A) Left Heart Cath and Coronary Angiography (N/A) as a surgical intervention .  The patient's history has been reviewed, patient examined, no change in status, stable for surgery.  I have reviewed the patient's chart and labs.  Questions were answered to the patient's satisfaction.    Cath Lab Visit (complete for each Cath Lab visit)  Clinical Evaluation Leading to the Procedure:   ACS: No.  Non-ACS:    Anginal Classification: CCS III  Anti-ischemic medical therapy: Minimal Therapy (1 class of medications)  Non-Invasive Test Results: High-risk stress test findings: cardiac mortality >3%/year  Prior CABG: No previous CABG  Chandlor Noecker

## 2016-08-02 NOTE — Care Management Note (Addendum)
Case Management Note  Patient Details  Name: Grace Hess MRN: [id number] Date of Birth: 1955-11-05  Subjective/Objective:    From home, pta indep, s/p stent intervention, will be on plavix.    Action/Plan: NCM will follow for dc needs.   Expected Discharge Date:                  Expected Discharge Plan:  Home/Self Care  In-House Referral:     Discharge planning Services  CM Consult  Post Acute Care Choice:    Choice offered to:     DME Arranged:    DME Agency:     HH Arranged:    HH Agency:     Status of Service:  In process, will continue to follow  If discussed at Long Length of Stay Meetings, dates discussed:    Additional Comments:  Zenon Mayo, RN 08/02/2016, 2:39 PM

## 2016-08-02 NOTE — Progress Notes (Signed)
CBG:164 @ 8916 and ACT 224 @ 9450

## 2016-08-02 NOTE — Discharge Summary (Signed)
Discharge Summary    Patient ID: Grace Hess,  MRN: [id number], DOB/AGE: May 13, 1955 61 y.o.  Admit date: 08/02/2016 Discharge date: 08/03/2016  Primary Care Provider: Sinda Du Primary Cardiologist: Harl Bowie  Discharge Diagnoses    Principal Problem:   Chest pain with high risk for cardiac etiology Active Problems:   Essential hypertension   DM type 2 (diabetes mellitus, type 2) (HCC)   Tobacco use disorder   Claudication (HCC)   Abnormal stress test   Stable angina (HCC)   CAD S/P percutaneous coronary angioplasty   Allergies Allergies  Allergen Reactions  . Codeine Shortness Of Breath  . Latex Itching  . Sulfa Antibiotics Other (See Comments)    Reaction:  Unknown     Diagnostic Studies/Procedures    LHC: 08/02/16  Conclusion     Prox LAD to Mid LAD lesion, 20 %stenosed.   Conclusions: 1. Significant 2 vessel coronary artery disease, including 60-70% mid LAD and 90% proximal RCA stenoses. 2. Mildly elevated left ventricular filling pressure. 3. Aortic atherosclerosis with mild to moderate bilateral common iliac artery disease. 4. 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 5. Bilateral two-vessel runoff. 6. Successful PCI to the proximal RCA with placement of a Promus Premier 2.5 x 16 mm drug-eluting stent with 0% residual stenosis and TIMI-3 flow.  Recommendations: 1. Continue dual antiplatelet therapy with aspirin and clopidogrel for at least 6 months, ideally longer. 2. If patient has recurrent chest pain, consider FFR guided PCI (likely requiring orbital atherectomy) to the mid LAD. 3. Peripheral angiography reviewed with Dr. Gwenlyn Found; endovascular intervention to left SFA could be considered in the future, if claudication persists.   _____________   History of Present Illness     61 yo female with PMH of HTN, HL, tobacco use, DM, and stroke who is followed in the office by Dr. Harl Bowie. Last seen on  07/30/16 reporting chest pain for the past 3 months. Underwent stress that showed small moderate intensity apical/anteroseptal defect partially reversible, read as high risk study. Given this finding was referred for outpatient cardiac cath.   Hospital Course     Underwent LHC with Dr. Saunders Revel in 08/02/16 noted above with DES x1 placed to pRCA. Plan for DAPT with ASA and plavix for at least the next 6 months, longer if tolerated. Medical therapy at this time for other areas. If she has recurrent pain, consider FFR guided PCI to the mLAD. Also noted disease in the left SFA that could be intervened on in the future if claudication continues. Labs the follow morning showed Cr 1.20 and Hgb 12.2. No complications noted.   She was seen by Dr Gwenlyn Found and determined stable for discharge home. Follow up has been arranged in the office. Medications are listed below.  _____________  Discharge Vitals Blood pressure (!) 152/73, pulse 65, temperature 98 F (36.7 C), temperature source Oral, resp. rate 19, height 5\' 6"  (1.676 m), weight 156 lb 8.4 oz (71 kg), SpO2 96 %.  Filed Weights   08/02/16 0535 08/03/16 0707  Weight: 160 lb (72.6 kg) 156 lb 8.4 oz (71 kg)    Labs & Radiologic Studies    CBC  Recent Labs  08/02/16 1507 08/03/16 0209  WBC 10.2 11.5*  HGB 12.0 12.2  HCT 37.1 37.3  MCV 92.3 90.5  PLT 231 485   Basic Metabolic Panel  Recent Labs  08/02/16 1507 08/03/16 0209  NA  --  136  K  --  4.8  CL  --  105  CO2  --  20*  GLUCOSE  --  207*  BUN  --  30*  CREATININE 1.19* 1.20*  CALCIUM  --  9.4   Liver Function Tests No results for input(s): AST, ALT, ALKPHOS, BILITOT, PROT, ALBUMIN in the last 72 hours. No results for input(s): LIPASE, AMYLASE in the last 72 hours. Cardiac Enzymes No results for input(s): CKTOTAL, CKMB, CKMBINDEX, TROPONINI in the last 72 hours. BNP Invalid input(s): POCBNP D-Dimer No results for input(s): DDIMER in the last 72 hours. Hemoglobin A1C No results  for input(s): HGBA1C in the last 72 hours. Fasting Lipid Panel No results for input(s): CHOL, HDL, LDLCALC, TRIG, CHOLHDL, LDLDIRECT in the last 72 hours. Thyroid Function Tests No results for input(s): TSH, T4TOTAL, T3FREE, THYROIDAB in the last 72 hours.  Invalid input(s): FREET3 _____________  Nm Myocar Multi W/spect W/wall Motion / Ef  Result Date: 07/22/2016  No diagnostic ST segment changes to indicate ischemia with Lexiscan.  Small, moderate intensity, apical anteroseptal defect is partially reversible suggestive of a mild ischemic territory. Thr TID ratio is also significantly increased at 1.67 suggesting the possibility of subendocardial or balanced multivessel ischemia.  This is a high risk study based predominantly on the significantly elevated TID ratio.  Nuclear stress EF: 63%.    US Arterial Seg Single  Addendum Date: 07/30/2016   ADDENDUM REPORT: 07/30/2016 09:47 ADDENDUM: The heading of the report was incorrect and it is corrected below: CLINICAL DATA: History of TIA and CVA. Hypertension. Hyperlipidemia. Diabetes mellitus. Tobacco use. Coronary disease. Carotid disease. Bilateral calf pain while walking, claudication. Decreased pulses on exam. EXAM: BILATERAL LOWER EXTREMITY ANKLE-BRACHIAL INDEX STUDY TECHNIQUE: Evaluation of both lower extremities was performed at rest, including calculation of ankle-brachial indices, multiple segmental pressure evaluation, segmental Doppler and segmental pulse volume recording. COMPARISON: None. FINDINGS: Right Ankle Brachial Index: 0.8 Left Ankle Brachial Index: 0.65 Right Lower Extremity: Right posterior tibial and dorsalis pedis waveforms are monophasic. Left Lower Extremity: Left posterior tibial artery waveform is monophasic while the dorsalis pedis is biphasic. IMPRESSION: There is severe bilateral lower extremity arterial occlusive disease. Electronically Signed   By: Marybelle Killings M.D.   On: 07/30/2016 09:47   Result Date:  07/30/2016 CLINICAL DATA:  History of TIA and CVA. Hypertension. Hyperlipidemia. Diabetes mellitus. Tobacco use. Coronary disease. Carotid disease. Bilateral calf pain while walking, claudication. Decreased pulses on exam. EXAM: NONINVASIVE PHYSIOLOGIC VASCULAR STUDY OF BILATERAL UPPER EXTREMITIES TECHNIQUE: Evaluation of both upper extremities was performed at rest, including calculation of wrist-brachial indices, multiple segmental pressure evaluation, segmental Doppler and segmental pulse volume recording. COMPARISON:  None. FINDINGS: Right Wrist Brachial Index: 0.8 Left Wrist Brachial Index: 0.65 Right Upper Extremity: Right posterior tibial and dorsalis pedis waveforms are monophasic. Left Upper Extremity: Left posterior tibial artery waveform is monophasic while the dorsalis pedis is biphasic. IMPRESSION: There is severe bilateral lower extremity arterial occlusive disease. Electronically Signed: By: Marybelle Killings M.D. On: 07/22/2016 10:15   Disposition   Pt is being discharged home today in good condition.  Follow-up Plans & Appointments    Follow-up Information    Arnoldo Lenis, MD Follow up on 08/25/2016.   Specialty:  Cardiology Why:  11 am Contact information: [address] [place] [address] [phone]            Discharge Medications   Current Discharge Medication List    START taking these medications   Details  acetaminophen (TYLENOL) 325 MG  tablet Take 2 tablets (650 mg total) by mouth every 4 (four) hours as needed for headache or mild pain.    atorvastatin (LIPITOR) 80 MG tablet Take 1 tablet (80 mg total) by mouth daily at 6 PM. Qty: 90 tablet, Refills: 3    nitroGLYCERIN (NITROSTAT) 0.4 MG SL tablet Place 1 tablet (0.4 mg total) under the tongue every 5 (five) minutes as needed for chest pain. Qty: 25 tablet, Refills: 2      CONTINUE these medications which have CHANGED   Details  metFORMIN (GLUCOPHAGE) 1000 MG tablet Take 1 tablet (1,000 mg  total) by mouth 2 (two) times daily with a meal. Qty: 60 tablet, Refills: 3      CONTINUE these medications which have NOT CHANGED   Details  ALPRAZolam (XANAX) 1 MG tablet Take 0.5 mg by mouth 3 (three) times daily as needed.     aspirin EC 81 MG tablet Take 1 tablet (81 mg total) by mouth daily. Please continue Aspirin and plavix for 6 months, then plavix daily Qty: 30 tablet, Refills: 6    clopidogrel (PLAVIX) 75 MG tablet Take 1 tablet (75 mg total) by mouth daily. Qty: 30 tablet, Refills: 3    dexlansoprazole (DEXILANT) 60 MG capsule Take 1 capsule (60 mg total) by mouth daily. Qty: 30 capsule, Refills: 11    dicyclomine (BENTYL) 10 MG capsule Take 1 capsule (10 mg total) by mouth 2 (two) times daily as needed for spasms. Qty: 90 capsule, Refills: 3    gabapentin (NEURONTIN) 300 MG capsule Take 300 mg by mouth 2 (two) times daily.     ibuprofen (ADVIL,MOTRIN) 200 MG tablet Take 200 mg by mouth every 6 (six) hours as needed.    lisinopril (PRINIVIL,ZESTRIL) 5 MG tablet Take 1 tablet by mouth daily. Refills: 12    metoprolol tartrate (LOPRESSOR) 25 MG tablet Take 0.5 tablets (12.5 mg total) by mouth 2 (two) times daily. Qty: 90 tablet, Refills: 1    nystatin cream (MYCOSTATIN) Apply 1 application topically 2 (two) times daily as needed. Refills: 5    ondansetron (ZOFRAN) 4 MG tablet TAKE 1 TABLET(4 MG) BY MOUTH TWICE DAILY AS NEEDED FOR NAUSEA OR VOMITING Qty: 20 tablet, Refills: 1    oxyCODONE (OXY IR/ROXICODONE) 5 MG immediate release tablet Take 1 tablet by mouth every 4 (four) hours as needed for moderate pain.  Refills: 0          Outstanding Labs/Studies    Duration of Discharge Encounter   Greater than 30 minutes including physician time.  Angelena Form PA-C 08/03/2016 9:26 AM  08/03/2016, 9:26 AM   Agree with note written by Kerin Ransom [facility]  See my note. OK for DC home. ROV with Dr Harl Bowie. Potential staged LSFA intervention. Med Rx of mid LAD  stenosis.  Quay Burow 08/03/2016 11:49 AM

## 2016-08-03 ENCOUNTER — Encounter (HOSPITAL_COMMUNITY): Payer: Self-pay | Admitting: Internal Medicine

## 2016-08-03 DIAGNOSIS — I70213 Atherosclerosis of native arteries of extremities with intermittent claudication, bilateral legs: Secondary | ICD-10-CM | POA: Diagnosis not present

## 2016-08-03 DIAGNOSIS — I25118 Atherosclerotic heart disease of native coronary artery with other forms of angina pectoris: Secondary | ICD-10-CM | POA: Diagnosis not present

## 2016-08-03 DIAGNOSIS — I6522 Occlusion and stenosis of left carotid artery: Secondary | ICD-10-CM | POA: Diagnosis not present

## 2016-08-03 DIAGNOSIS — I7092 Chronic total occlusion of artery of the extremities: Secondary | ICD-10-CM | POA: Diagnosis not present

## 2016-08-03 DIAGNOSIS — K219 Gastro-esophageal reflux disease without esophagitis: Secondary | ICD-10-CM | POA: Diagnosis not present

## 2016-08-03 DIAGNOSIS — I700 Atherosclerosis of aorta: Secondary | ICD-10-CM | POA: Diagnosis not present

## 2016-08-03 DIAGNOSIS — I208 Other forms of angina pectoris: Secondary | ICD-10-CM | POA: Diagnosis not present

## 2016-08-03 DIAGNOSIS — R9439 Abnormal result of other cardiovascular function study: Secondary | ICD-10-CM | POA: Diagnosis not present

## 2016-08-03 DIAGNOSIS — Z8673 Personal history of transient ischemic attack (TIA), and cerebral infarction without residual deficits: Secondary | ICD-10-CM | POA: Diagnosis not present

## 2016-08-03 DIAGNOSIS — Z882 Allergy status to sulfonamides status: Secondary | ICD-10-CM | POA: Diagnosis not present

## 2016-08-03 DIAGNOSIS — Z9861 Coronary angioplasty status: Secondary | ICD-10-CM

## 2016-08-03 DIAGNOSIS — I2584 Coronary atherosclerosis due to calcified coronary lesion: Secondary | ICD-10-CM | POA: Diagnosis not present

## 2016-08-03 DIAGNOSIS — Z91040 Latex allergy status: Secondary | ICD-10-CM | POA: Diagnosis not present

## 2016-08-03 DIAGNOSIS — E669 Obesity, unspecified: Secondary | ICD-10-CM | POA: Diagnosis not present

## 2016-08-03 DIAGNOSIS — I2510 Atherosclerotic heart disease of native coronary artery without angina pectoris: Secondary | ICD-10-CM

## 2016-08-03 DIAGNOSIS — F17210 Nicotine dependence, cigarettes, uncomplicated: Secondary | ICD-10-CM | POA: Diagnosis not present

## 2016-08-03 DIAGNOSIS — I10 Essential (primary) hypertension: Secondary | ICD-10-CM | POA: Diagnosis not present

## 2016-08-03 DIAGNOSIS — Z6825 Body mass index (BMI) 25.0-25.9, adult: Secondary | ICD-10-CM | POA: Diagnosis not present

## 2016-08-03 DIAGNOSIS — E7800 Pure hypercholesterolemia, unspecified: Secondary | ICD-10-CM | POA: Diagnosis not present

## 2016-08-03 DIAGNOSIS — I739 Peripheral vascular disease, unspecified: Secondary | ICD-10-CM | POA: Diagnosis not present

## 2016-08-03 DIAGNOSIS — E1151 Type 2 diabetes mellitus with diabetic peripheral angiopathy without gangrene: Secondary | ICD-10-CM | POA: Diagnosis not present

## 2016-08-03 LAB — GLUCOSE, CAPILLARY
GLUCOSE-CAPILLARY: 196 mg/dL — AB (ref 65–99)
Glucose-Capillary: 228 mg/dL — ABNORMAL HIGH (ref 65–99)

## 2016-08-03 LAB — CBC
HCT: 37.3 % (ref 36.0–46.0)
HEMOGLOBIN: 12.2 g/dL (ref 12.0–15.0)
MCH: 29.6 pg (ref 26.0–34.0)
MCHC: 32.7 g/dL (ref 30.0–36.0)
MCV: 90.5 fL (ref 78.0–100.0)
Platelets: 229 10*3/uL (ref 150–400)
RBC: 4.12 MIL/uL (ref 3.87–5.11)
RDW: 13.7 % (ref 11.5–15.5)
WBC: 11.5 10*3/uL — ABNORMAL HIGH (ref 4.0–10.5)

## 2016-08-03 LAB — BASIC METABOLIC PANEL
Anion gap: 11 (ref 5–15)
BUN: 30 mg/dL — ABNORMAL HIGH (ref 6–20)
CALCIUM: 9.4 mg/dL (ref 8.9–10.3)
CO2: 20 mmol/L — AB (ref 22–32)
CREATININE: 1.2 mg/dL — AB (ref 0.44–1.00)
Chloride: 105 mmol/L (ref 101–111)
GFR calc Af Amer: 55 mL/min — ABNORMAL LOW (ref >60)
GFR calc non Af Amer: 48 mL/min — ABNORMAL LOW (ref >60)
GLUCOSE: 207 mg/dL — AB (ref 65–99)
Potassium: 4.8 mmol/L (ref 3.5–5.1)
Sodium: 136 mmol/L (ref 135–145)

## 2016-08-03 MED ORDER — NITROGLYCERIN 0.4 MG SL SUBL: 0.4000 mg | SUBLINGUAL_TABLET | SUBLINGUAL | 2 refills | Status: DC | PRN

## 2016-08-03 MED ORDER — ACETAMINOPHEN 325 MG PO TABS: 650.0000 mg | ORAL_TABLET | ORAL | Status: AC | PRN

## 2016-08-03 MED ORDER — ATORVASTATIN CALCIUM 80 MG PO TABS: 80.0000 mg | ORAL_TABLET | Freq: Every day | ORAL | 3 refills | Status: DC

## 2016-08-03 MED ORDER — METFORMIN HCL 1000 MG PO TABS: 1000.0000 mg | ORAL_TABLET | Freq: Two times a day (BID) | ORAL | 3 refills | Status: DC

## 2016-08-03 NOTE — Progress Notes (Signed)
Progress Note  Patient Name: Grace Hess Date of Encounter: 08/03/2016  Primary Cardiologist: Harl Bowie  Subjective   No chest pain or shortness of breath, postop day one RCA PCI drug-eluting stent as well as peripheral angiography.  Inpatient Medications    Scheduled Meds: . aspirin EC  81 mg Oral Daily  . atorvastatin  80 mg Oral q1800  . clopidogrel  75 mg Oral Daily  . gabapentin  300 mg Oral BID  . heparin  5,000 Units Subcutaneous Q8H  . insulin aspart  0-15 Units Subcutaneous TID WC  . insulin aspart  0-5 Units Subcutaneous QHS  . lisinopril  5 mg Oral Daily  . metoprolol tartrate  12.5 mg Oral BID  . pantoprazole  40 mg Oral Daily  . sodium chloride flush  3 mL Intravenous Q12H   Continuous Infusions: . sodium chloride     PRN Meds: sodium chloride, acetaminophen, ALPRAZolam, ondansetron (ZOFRAN) IV, oxyCODONE, sodium chloride flush   Vital Signs    Vitals:   08/02/16 1800 08/02/16 2000 08/02/16 2017 08/03/16 0707  BP: (!) 154/89  (!) 157/53 (!) 152/73  Pulse: 61  62 65  Resp: 14 12 14 19   Temp:   98 F (36.7 C) 98 F (36.7 C)  TempSrc:   Oral Oral  SpO2: 97%  95% 96%  Weight:    156 lb 8.4 oz (71 kg)  Height:        Intake/Output Summary (Last 24 hours) at 08/03/16 0753 Last data filed at 08/03/16 0054  Gross per 24 hour  Intake              780 ml  Output             3300 ml  Net            -2520 ml   Filed Weights   08/02/16 0535 08/03/16 0707  Weight: 160 lb (72.6 kg) 156 lb 8.4 oz (71 kg)    Telemetry    Normal sinus rhythm - Personally Reviewed  ECG    Normal sinus rhythm at 64 with nonspecific ST and T-wave changes - Personally Reviewed  Physical Exam   GEN: No acute distress.   Neck: No JVD Cardiac: RRR, no murmurs, rubs, or gallops.  Respiratory: Clear to auscultation bilaterally. GI: Soft, nontender, non-distended  MS: No edema; No deformity. Neuro:  Nonfocal  Psych: Normal affect  Right femoral arterial puncture  site well-healed without hematoma or ecchymosis Labs    Chemistry Recent Labs Lab 07/29/16 1100 08/02/16 1507 08/03/16 0209  NA 139  --  136  K 5.1  --  4.8  CL 104  --  105  CO2 23  --  20*  GLUCOSE 219*  --  207*  BUN 20  --  30*  CREATININE 1.10* 1.19* 1.20*  CALCIUM 9.6  --  9.4  GFRNONAA  --  48* 48*  GFRAA  --  56* 55*  ANIONGAP  --   --  11     Hematology Recent Labs Lab 07/29/16 1100 08/02/16 1507 08/03/16 0209  WBC 9.2 10.2 11.5*  RBC 4.34 4.02 4.12  HGB 13.0 12.0 12.2  HCT 39.2 37.1 37.3  MCV 90.3 92.3 90.5  MCH 30.0 29.9 29.6  MCHC 33.2 32.3 32.7  RDW 14.0 14.0 13.7  PLT 259 231 229    Cardiac EnzymesNo results for input(s): TROPONINI in the last 168 hours. No results for input(s): TROPIPOC in the last 168 hours.  BNPNo results for input(s): BNP, PROBNP in the last 168 hours.   DDimer No results for input(s): DDIMER in the last 168 hours.   Radiology    No results found.  Cardiac Studies   Cardiac Cath and periph angio  Conclusions: 1. Significant 2 vessel coronary artery disease, including 60-70% mid LAD and 90% proximal RCA stenoses. 2. Mildly elevated left ventricular filling pressure. 3. Aortic atherosclerosis with mild to moderate bilateral common iliac artery disease. 4. 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 5. Bilateral two-vessel runoff. 6. Successful PCI to the proximal RCA with placement of a Promus Premier 2.5 x 16 mm drug-eluting stent with 0% residual stenosis and TIMI-3 flow.  Recommendations: 1. Continue dual antiplatelet therapy with aspirin and clopidogrel for at least 6 months, ideally longer. 2. If patient has recurrent chest pain, consider FFR guided PCI (likely requiring orbital atherectomy) to the mid LAD. 3. Peripheral angiography reviewed with Dr. Gwenlyn Found; endovascular intervention to left SFA could be considered in the future, if claudication persists.  Patient Profile       61 y.o. female admitted for coronary angiography and peripheral angiography. She has positive risk factors including hypertension, hyperlipidemia, type 2 diabetes and carotid disease. She had a positive Myoview stress test underwent cardiac catheterization through the femoral approach by Dr. Saunders Revel yesterday revealing a high-grade proximal RCA lesion which underwent PCI and drug-eluting stenting with a Promus Premier drug-eluting stent. She did have a 70% mid LAD lesion which was not addressed and normal LV function. In addition, angiogram revealed a totally occluded left SFA reconstituting in the adductor canal with two-vessel runoff bilaterally. She does have lifestyle limiting claudication. I believe she will be covered good candidate for directional atherectomy and drug-eluting balloon angioplasty of her left SFA which can be done in a staged fashion. She is asymptomatic this morning and ready for discharge  Assessment & Plan    1: Coronary artery disease-status post left heart cath by Dr. Saunders Revel revealing two-vessel disease with high-grade proximal RCA which was "the culprit lesion". She had PCI and drug-eluting stenting with a Promus Premier drug-eluting stent with excellent result. She is on Dilantin therapy which will be continued for at least 12 months uninterrupted. The mid LAD lesion will be treated medically unless she becomes symptomatic.  2: Peripheral arterial disease-totally occluded left SFA with two-vessel runoff bilaterally. Her left lower extremity is symptomatic with limiting claudication. I believe she is a candidate for directional atherectomy and drug-eluting balloon angioplasty which can be performed staged fashion by Drs End and myself.  3: Tobacco abuse-continue tobacco abuse recalcitrant with Dr. modification  4: Hyperlipidemia-intolerant to statin therapy  5: Hypertension-controlled on her medications.  The patient was assessed and examined this morning. Her lab exam is stable.  Her groin is stable. She is okay for discharge to home. She will follow-up with Dr. Roderic Palau branch. We will consider staged peripheral intervention of her left SFA.  Angelina Sheriff, MD  08/03/2016, 7:53 AM

## 2016-08-03 NOTE — Discharge Instructions (Signed)
Coronary Angiogram With Stent, Care After This sheet gives you information about how to care for yourself after your procedure. Your health care provider may also give you more specific instructions. If you have problems or questions, contact your health care provider. What can I expect after the procedure? After your procedure, it is common to have:  Bruising in the area where a small, thin tube (catheter) was inserted. This usually fades within 1-2 weeks.  Blood collecting in the tissue (hematoma) that may be painful to the touch. It should usually decrease in size and tenderness within 1-2 weeks. Follow these instructions at home: Insertion area care   Do not take baths, swim, or use a hot tub until your health care provider approves.  You may shower 24-48 hours after the procedure or as directed by your health care provider.  Follow instructions from your health care provider about how to take care of your incision. Make sure you:  Wash your hands with soap and water before you change your bandage (dressing). If soap and water are not available, use hand sanitizer.  Change your dressing as told by your health care provider.  Leave stitches (sutures), skin glue, or adhesive strips in place. These skin closures may need to stay in place for 2 weeks or longer. If adhesive strip edges start to loosen and curl up, you may trim the loose edges. Do not remove adhesive strips completely unless your health care provider tells you to do that.  Remove the bandage (dressing) and gently wash the catheter insertion site with plain soap and water.  Pat the area dry with a clean towel. Do not rub the area, because that may cause bleeding.  Do not apply powder or lotion to the incision area.  Check your incision area every day for signs of infection. Check for:  More redness, swelling, or pain.  More fluid or blood.  Warmth.  Pus or a bad smell. Activity   Do not drive for 24 hours if you  were given a medicine to help you relax (sedative).  Do not lift anything that is heavier than 10 lb (4.5 kg) for 5 days after your procedure or as directed by your health care provider.  Ask your health care provider when it is okay for you:  To return to work or school.  To resume usual physical activities or sports.  To resume sexual activity. Eating and drinking   Eat a heart-healthy diet. This should include plenty of fresh fruits and vegetables.  Avoid the following types of food:  Food that is high in salt.  Canned or highly processed food.  Food that is high in saturated fat or sugar.  Fried food.  Limit alcohol intake to no more than 1 drink a day for non-pregnant women and 2 drinks a day for men. One drink equals 12 oz of beer, 5 oz of wine, or 1 oz of hard liquor. Lifestyle   Do not use any products that contain nicotine or tobacco, such as cigarettes and e-cigarettes. If you need help quitting, ask your health care provider.  Take steps to manage and control your weight.  Get regular exercise.  Manage your blood pressure.  Manage other health problems, such as diabetes. General instructions   Take over-the-counter and prescription medicines only as told by your health care provider. Blood thinners may be prescribed after your procedure to improve blood flow through the stent.  If you need an MRI after your heart stent  has been placed, be sure to tell the health care provider who orders the MRI that you have a heart stent.  Keep all follow-up visits as directed by your health care provider. This is important. Contact a health care provider if:  You have a fever.  You have chills.  You have increased bleeding from the catheter insertion area. Hold pressure on the area. Get help right away if:  You develop chest pain or shortness of breath.  You feel faint or you pass out.  You have unusual pain at the catheter insertion area.  You have redness,  warmth, or swelling at the catheter insertion area.  You have drainage (other than a small amount of blood on the dressing) from the catheter insertion area.  The catheter insertion area is bleeding, and the bleeding does not stop after 30 minutes of holding steady pressure on the area.  You develop bleeding from any other place, such as from your rectum. There may be bright red blood in your urine or stool, or it may appear as black, tarry stool. This information is not intended to replace advice given to you by your health care provider. Make sure you discuss any questions you have with your health care provider. Document Released: 09/18/2004 Document Revised: 11/27/2015 Document Reviewed: 11/27/2015 Elsevier Interactive Patient Education  2017 [employer].

## 2016-08-03 NOTE — Progress Notes (Signed)
CARDIAC REHAB PHASE I   PRE:  Rate/Rhythm: 72 SR  BP:  Sitting: 161/67    MODE:  Ambulation: 500 ft   POST:  Rate/Rhythm: 87 SR  BP:  Sitting: 141/84       Pt ambulated 500 ft on RA, independent, steady gait, tolerated well with no complaints. Completed PCI/stent education with pt and husband at bedside.  Reviewed risk factors, tobacco cessation (gave pt fake cigarette), PCI book, anti-platelet therapy, stent card, activity restrictions, ntg, exercise, heart healthy diet, carb counting, and phase 2 cardiac rehab. Pt verbalized understanding. Pt agrees to phase 2 cardiac rehab referral, will send to [facility]. Pt became tearful when discussing tobacco cessation, states she has been under a large amount of stress since she lost her son last year, is concerned that she will have difficulty quitting. Emotional support given to pt, discussed the possibility of grief counseling. Pt to bed per pt request after walk, call bell within reach.   [phone] Grace Sciara, RN, BSN 08/03/2016 9:58 AM

## 2016-08-10 ENCOUNTER — Other Ambulatory Visit (INDEPENDENT_AMBULATORY_CARE_PROVIDER_SITE_OTHER): Payer: Self-pay | Admitting: *Deleted

## 2016-08-10 MED ORDER — ONDANSETRON HCL 4 MG PO TABS: 4.0000 mg | ORAL_TABLET | Freq: Two times a day (BID) | ORAL | 0 refills | Status: DC | PRN

## 2016-08-23 ENCOUNTER — Ambulatory Visit: Payer: Self-pay | Admitting: Cardiology

## 2016-08-25 ENCOUNTER — Encounter: Payer: Self-pay | Admitting: Cardiology

## 2016-08-25 ENCOUNTER — Ambulatory Visit (INDEPENDENT_AMBULATORY_CARE_PROVIDER_SITE_OTHER): Payer: BLUE CROSS/BLUE SHIELD | Admitting: Cardiology

## 2016-08-25 VITALS — BP 110/64 | HR 74 | Ht 66.0 in | Wt 156.0 lb

## 2016-08-25 DIAGNOSIS — I739 Peripheral vascular disease, unspecified: Secondary | ICD-10-CM

## 2016-08-25 DIAGNOSIS — I2510 Atherosclerotic heart disease of native coronary artery without angina pectoris: Secondary | ICD-10-CM | POA: Diagnosis not present

## 2016-08-25 MED ORDER — CILOSTAZOL 50 MG PO TABS: 50.0000 mg | ORAL_TABLET | Freq: Two times a day (BID) | ORAL | 6 refills | Status: DC

## 2016-08-25 NOTE — Progress Notes (Signed)
Clinical Summary Grace Hess is a 61 y.o.female seen today for follow up of the following medical problems. This is a focused visit on her recent symptoms of chest pain and leg pains. For more detailed history please refer to prior clinic notes.   1. Chest pain - symptoms started about 3 months. Sharp pain left, 7/10 that occurred at rest. Can have some palpitations at times. Lasted a few hours. Better with ibuprofen and pain pill. Worst with deep breath. Total of 20 episodes.  - can have some DOE with walking inclines    - 07/2016 nuclear stress test shows small moderate intensity apical/anteroseptal defect partially reversible, tid 1.67. Overall high risk study.  -- cath 07/2016 with 60-70% mid LAD, 90% prox RCA. Received DES to RCA. Recs per interventional if continued chest pain could consider FFR guided PCI of mid LAD  - no recent chest pain. No SOB or DOE - compliant with meds   2. PAD - pain in bilateral calves with walking, left greater than right. Pain with 1/2 block, worst with incline. Better with rest - ABIs report looks to be mislabled as upper extremity report. ABIs show mild to moderate disease. 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 Bilateral 2 vessel runoff. Could consider intervention if neccesary.   - still with leg pains. Has not started her home walking program yet.      SH: upcoming trip to [place]   Past Medical History:  Diagnosis Date  . Anxiety   . Arthritis    "might have a touch in my fingers" (08/02/2016)  . Cervicalgia   . Chest pain, unspecified   . GERD (gastroesophageal reflux disease)   . Headache    "bad ones after my stroke in 2016; only have them when I get bad news now" (08/02/2016)  . Heart murmur   . Heavy smoker (more than 20 cigarettes per day)    Scheduled for LDCT screening 02/11/15  . Hepatitis A    "related to [employer]"  . History of hiatal hernia   . History of kidney stones    "no cysto/OR" (12/10/2014)  . Hypercholesteremia   . Hypertension   . Obesity   . Reflux esophagitis   . Stroke [facility])    "they said I had a silent stroke a few months back/MRI" (12/10/2014)  . TIA (transient ischemic attack) 11/28/2014   [name] 11/28/2014  . Type II diabetes mellitus (HCC)      Allergies  Allergen Reactions  . Codeine Shortness Of Breath  . Latex Itching  . Sulfa Antibiotics Other (See Comments)    Reaction:  Unknown      Current Outpatient Prescriptions  Medication Sig Dispense Refill  . acetaminophen (TYLENOL) 325 MG tablet Take 2 tablets (650 mg total) by mouth every 4 (four) hours as needed for headache or mild pain.    Marland Kitchen ALPRAZolam (XANAX) 1 MG tablet Take 0.5 mg by mouth 3 (three) times daily as needed.     Marland Kitchen aspirin EC 81 MG tablet Take 1 tablet (81 mg total) by mouth daily. Please continue Aspirin and plavix for 6 months, then plavix daily 30 tablet 6  . atorvastatin (LIPITOR) 80 MG tablet Take 1 tablet (80 mg total) by mouth daily at 6 PM. 90 tablet 3  . clopidogrel (PLAVIX) 75 MG tablet Take 1 tablet (75 mg total) by mouth  daily. 30 tablet 3  . dexlansoprazole (DEXILANT) 60 MG capsule Take 1 capsule (60 mg total) by mouth daily. 30 capsule 11  . dicyclomine (BENTYL) 10 MG capsule Take 1 capsule (10 mg total) by mouth 2 (two) times daily as needed for spasms. 90 capsule 3  . gabapentin (NEURONTIN) 300 MG capsule Take 300 mg by mouth 2 (two) times daily.     Marland Kitchen ibuprofen (ADVIL,MOTRIN) 200 MG tablet Take 200 mg by mouth every 6 (six) hours as needed.    Marland Kitchen lisinopril (PRINIVIL,ZESTRIL) 5 MG tablet Take 1 tablet by mouth daily.  12  . metFORMIN (GLUCOPHAGE) 1000 MG tablet Take 1 tablet (1,000 mg total) by mouth 2 (two) times daily with a meal. 60 tablet 3  . metoprolol tartrate (LOPRESSOR) 25 MG tablet Take 0.5 tablets (12.5 mg total) by mouth 2 (two) times daily. 90 tablet 1  .  nitroGLYCERIN (NITROSTAT) 0.4 MG SL tablet Place 1 tablet (0.4 mg total) under the tongue every 5 (five) minutes as needed for chest pain. 25 tablet 2  . nystatin cream (MYCOSTATIN) Apply 1 application topically 2 (two) times daily as needed.  5  . ondansetron (ZOFRAN) 4 MG tablet Take 1 tablet (4 mg total) by mouth 2 (two) times daily as needed for nausea or vomiting. 20 tablet 0  . oxyCODONE (OXY IR/ROXICODONE) 5 MG immediate release tablet Take 1 tablet by mouth every 4 (four) hours as needed for moderate pain.   0   No current facility-administered medications for this visit.      Past Surgical History:  Procedure Laterality Date  . ABDOMINAL AORTOGRAM N/A 08/02/2016   Procedure: Abdominal Aortogram;  Surgeon: Nelva Bush, MD;  Location: [place] [facility];  Service: Cardiovascular;  Laterality: N/A;  . CHOLECYSTECTOMY OPEN  1978  . CORONARY ANGIOPLASTY WITH STENT PLACEMENT  08/02/2016   to RCA  . CORONARY STENT INTERVENTION N/A 08/02/2016   Procedure: Coronary Stent Intervention;  Surgeon: Nelva Bush, MD;  Location: [place] [facility];  Service: Cardiovascular;  Laterality: N/A;  RCA  . ENDARTERECTOMY Left 12/02/2014   Procedure: ENDARTERECTOMY CAROTID;  Surgeon: Rosetta Posner, MD;  Location: [place];  Service: Vascular;  Laterality: Left;  . LEFT HEART CATH AND CORONARY ANGIOGRAPHY N/A 08/02/2016   Procedure: Left Heart Cath and Coronary Angiography;  Surgeon: Nelva Bush, MD;  Location: [place] [facility];  Service: Cardiovascular;  Laterality: N/A;  . LOWER EXTREMITY ANGIOGRAM  08/02/2016  . LOWER EXTREMITY ANGIOGRAPHY N/A 08/02/2016   Procedure: Lower Extremity Angiography;  Surgeon: Nelva Bush, MD;  Location: [place] [facility];  Service: Cardiovascular;  Laterality: N/A;  . VAGINAL HYSTERECTOMY  1991   "partial"     Allergies  Allergen Reactions  . Codeine Shortness Of Breath  . Latex Itching  . Sulfa Antibiotics Other (See Comments)    Reaction:  Unknown         Family History  Problem Relation Age of Onset  . Congestive Heart Failure Mother   . COPD Father   . Cancer Brother   . Cancer Brother      Social History Grace Hess reports that she has been smoking Cigarettes.  She started smoking about 42 years ago. She has a 86.00 pack-year smoking history. She has never used smokeless tobacco. Grace Hess reports that she does not drink alcohol.   Review of Systems CONSTITUTIONAL: No weight loss, fever, chills, weakness or fatigue.  HEENT: Eyes: No visual loss, blurred vision, double vision or  yellow sclerae.No hearing loss, sneezing, congestion, runny nose or sore throat.  SKIN: No rash or itching.  CARDIOVASCULAR: per hpi RESPIRATORY: No shortness of breath, cough or sputum.  GASTROINTESTINAL: No anorexia, nausea, vomiting or diarrhea. No abdominal pain or blood.  GENITOURINARY: No burning on urination, no polyuria NEUROLOGICAL: No headache, dizziness, syncope, paralysis, ataxia, numbness or tingling in the extremities. No change in bowel or bladder control.  MUSCULOSKELETAL: + leg pains with walking.  LYMPHATICS: No enlarged nodes. No history of splenectomy.  PSYCHIATRIC: No history of depression or anxiety.  ENDOCRINOLOGIC: No reports of sweating, cold or heat intolerance. No polyuria or polydipsia.  Marland Kitchen   Physical Examination Vitals:   08/25/16 1102  BP: 110/64  Pulse: 74   Vitals:   08/25/16 1102  Weight: 156 lb (70.8 kg)  Height: 5\' 6"  (1.676 m)    Gen: resting comfortably, no acute distress HEENT: no scleral icterus, pupils equal round and reactive, no palptable cervical adenopathy,  CV: RRR, 2/6 systolic murmur rusb, no jvd Resp: Clear to auscultation bilaterally GI: abdomen is soft, non-tender, non-distended, normal bowel sounds, no hepatosplenomegaly MSK: extremities are warm, no edema.  Skin: warm, no rash Neuro:  no focal deficits Psych: appropriate affect   Diagnostic Studies 11/2014 echo Study  Conclusions  - Left ventricle: The cavity size was normal. There was mild focal basal hypertrophy of the septum. Systolic function was normal. The estimated ejection fraction was in the range of 60% to 65%. Wall motion was normal; there were no regional wall motion abnormalities. Doppler parameters are consistent with abnormal left ventricular relaxation (grade 1 diastolic dysfunction). - Aortic valve: Trileaflet; mildly calcified leaflets. There was trivial regurgitation. - Mitral valve: Calcified annulus. - Right atrium: Central venous pressure (est): 3 mm Hg. - Atrial septum: There was increased thickness of the septum, consistent with lipomatous hypertrophy. No defect or patent foramen ovale was identified. - Tricuspid valve: There was trivial regurgitation. - Pulmonary arteries: Systolic pressure could not be accurately estimated. - Pericardium, extracardiac: A prominent pericardial fat pad was present.  Impressions:  - Mild basal septal LV hypertrophy with LVEF 60-65% and grade 1 diastolic dysfunction. Mildly calcified mitral annulus as well as mildly sclerotic aortic valve. Trivial aortic regurgitation. No obvious PFO or ASD.  07/2016 Nuclear stress  No diagnostic ST segment changes to indicate ischemia with Lexiscan.  Small, moderate intensity, apical anteroseptal defect is partially reversible suggestive of a mild ischemic territory. Thr TID ratio is also significantly increased at 1.67 suggesting the possibility of subendocardial or balanced multivessel ischemia.  This is a high risk study based predominantly on the significantly elevated TID ratio.  Nuclear stress EF: 63%.   07/2016 cath  Prox LAD to Mid LAD lesion, 20 %stenosed.   Conclusions: 1. Significant 2 vessel coronary artery disease, including 60-70% mid LAD and 90% proximal RCA stenoses. 2. Mildly elevated left ventricular filling pressure. 3. Aortic atherosclerosis with  mild to moderate bilateral common iliac artery disease. 4. 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 5. Bilateral two-vessel runoff. 6. Successful PCI to the proximal RCA with placement of a Promus Premier 2.5 x 16 mm drug-eluting stent with 0% residual stenosis and TIMI-3 flow.  Recommendations: 1. Continue dual antiplatelet therapy with aspirin and clopidogrel for at least 6 months, ideally longer. 2. If patient has recurrent chest pain, consider FFR guided PCI (likely requiring orbital atherectomy) to the mid LAD. 3. Peripheral angiography reviewed with Dr. Gwenlyn Found; endovascular intervention to left  SFA could be considered in the future, if claudication persists. Assessment and Plan    1. CAD - s/p recent stenting - continue DAPT. Medically manage moderate LAD lesion. If recurrent symptoms consider FFR PCI of LAD.   2. PAD - ongoing claudication - encouraged to start home walking program. Will start cilostazol 50mg  bid.  - follow symptoms, if peristent may consider intervention on left SFA.        F/u 3 months      Arnoldo Lenis, M.D

## 2016-08-25 NOTE — Patient Instructions (Signed)
Medication Instructions:  START CILOSTAZOL 50 MG TWICE DAILY   Labwork: NONE  Testing/Procedures: NONE  Follow-Up: Your physician recommends that you schedule a follow-up appointment in: 3 MONTHS    Any Other Special Instructions Will Be Listed Below (If Applicable).     If you need a refill on your cardiac medications before your next appointment, please call your pharmacy.

## 2016-09-02 ENCOUNTER — Encounter (INDEPENDENT_AMBULATORY_CARE_PROVIDER_SITE_OTHER): Payer: Self-pay | Admitting: Internal Medicine

## 2016-09-14 ENCOUNTER — Telehealth: Payer: Self-pay | Admitting: Cardiology

## 2016-09-14 NOTE — Telephone Encounter (Signed)
No other great medication options. I would try 50mg  daily for a week and have her update [place] again in 1 week. How is her leg pain doing?   Zandra Abts MD

## 2016-09-14 NOTE — Telephone Encounter (Signed)
Patient states that Cilostazol is causing diarrhea. Wants to know if she can put on something else. / tg

## 2016-09-14 NOTE — Telephone Encounter (Signed)
Called patient. No answer. Left message with instructions on private VM to call back.

## 2016-09-14 NOTE — Telephone Encounter (Signed)
Will forward to Dr Harl Bowie for advice

## 2016-09-16 ENCOUNTER — Other Ambulatory Visit (INDEPENDENT_AMBULATORY_CARE_PROVIDER_SITE_OTHER): Payer: Self-pay | Admitting: *Deleted

## 2016-09-16 MED ORDER — ONDANSETRON HCL 4 MG PO TABS: 4.0000 mg | ORAL_TABLET | Freq: Two times a day (BID) | ORAL | 0 refills | Status: DC | PRN

## 2016-09-16 NOTE — Telephone Encounter (Signed)
Patient called the office and left a message asking that her Zofran be refilled for nausea. Per Deberah Castle, NP may refill this medication , sent to rhe patient's pharmacy, Patient was made aware.

## 2016-10-06 ENCOUNTER — Encounter (INDEPENDENT_AMBULATORY_CARE_PROVIDER_SITE_OTHER): Payer: Self-pay | Admitting: Internal Medicine

## 2016-10-06 ENCOUNTER — Encounter (INDEPENDENT_AMBULATORY_CARE_PROVIDER_SITE_OTHER): Payer: Self-pay

## 2016-10-06 ENCOUNTER — Ambulatory Visit (INDEPENDENT_AMBULATORY_CARE_PROVIDER_SITE_OTHER): Payer: BLUE CROSS/BLUE SHIELD | Admitting: Internal Medicine

## 2016-10-06 VITALS — BP 100/60 | HR 72 | Temp 98.3°F | Ht 66.0 in | Wt 157.2 lb

## 2016-10-06 DIAGNOSIS — K588 Other irritable bowel syndrome: Secondary | ICD-10-CM

## 2016-10-06 DIAGNOSIS — K219 Gastro-esophageal reflux disease without esophagitis: Secondary | ICD-10-CM | POA: Diagnosis not present

## 2016-10-06 MED ORDER — DICYCLOMINE HCL 10 MG PO CAPS: 10.0000 mg | ORAL_CAPSULE | Freq: Two times a day (BID) | ORAL | 5 refills | Status: DC

## 2016-10-06 NOTE — Progress Notes (Signed)
Subjective:    Patient ID: Grace Hess, female    DOB: [birth year], 61 y.o.   MRN: [id number]  HPI  Here today for f/u. Last seen in July of 2017. Hx of chronic GERD and IBS. States she had cardiac stent placed in May of this year. She states her acid reflux is okay. She takes Dexilant as needed.  Appetite is good. She has been eating a lot of fresh vegetables. She is having a BM 4-5 times a day. Stools are loose with some form.  She states she takes the Dicyclomine as needed.  Maintained of Plavix for hx of CVA Hx significant for CAD   Review of Systems Past Medical History:  Diagnosis Date  . Anxiety   . Arthritis    "might have a touch in my fingers" (08/02/2016)  . Cervicalgia   . Chest pain, unspecified   . GERD (gastroesophageal reflux disease)   . Headache    "bad ones after my stroke in 2016; only have them when I get bad news now" (08/02/2016)  . Heart murmur   . Heavy smoker (more than 20 cigarettes per day)    Scheduled for LDCT screening 02/11/15  . Hepatitis A    "related to [employer]"  . History of hiatal hernia   . History of kidney stones    "no cysto/OR" (12/10/2014)  . Hypercholesteremia   . Hypertension   . Obesity   . Reflux esophagitis   . Stroke [facility])    "they said I had a silent stroke a few months back/MRI" (12/10/2014)  . TIA (transient ischemic attack) 11/28/2014   [name] 11/28/2014  . Type II diabetes mellitus ([place])     Past Surgical History:  Procedure Laterality Date  . ABDOMINAL AORTOGRAM N/A 08/02/2016   Procedure: Abdominal Aortogram;  Surgeon: Nelva Bush, MD;  Location: [place] [facility];  Service: Cardiovascular;  Laterality: N/A;  . CHOLECYSTECTOMY OPEN  1978  . CORONARY ANGIOPLASTY WITH STENT PLACEMENT  08/02/2016   to RCA  . CORONARY STENT INTERVENTION N/A 08/02/2016   Procedure: Coronary Stent Intervention;  Surgeon: Nelva Bush, MD;  Location: [place] [facility];  Service: Cardiovascular;  Laterality: N/A;  RCA  .  ENDARTERECTOMY Left 12/02/2014   Procedure: ENDARTERECTOMY CAROTID;  Surgeon: Rosetta Posner, MD;  Location: [place];  Service: Vascular;  Laterality: Left;  . LEFT HEART CATH AND CORONARY ANGIOGRAPHY N/A 08/02/2016   Procedure: Left Heart Cath and Coronary Angiography;  Surgeon: Nelva Bush, MD;  Location: [place] [facility];  Service: Cardiovascular;  Laterality: N/A;  . LOWER EXTREMITY ANGIOGRAM  08/02/2016  . LOWER EXTREMITY ANGIOGRAPHY N/A 08/02/2016   Procedure: Lower Extremity Angiography;  Surgeon: Nelva Bush, MD;  Location: [place] [facility];  Service: Cardiovascular;  Laterality: N/A;  . VAGINAL HYSTERECTOMY  1991   "partial"    Allergies  Allergen Reactions  . Codeine Shortness Of Breath  . Latex Itching  . Sulfa Antibiotics Other (See Comments)    Reaction:  Unknown     Current Outpatient Prescriptions on File Prior to Visit  Medication Sig Dispense Refill  . acetaminophen (TYLENOL) 325 MG tablet Take 2 tablets (650 mg total) by mouth every 4 (four) hours as needed for headache or mild pain.    Marland Kitchen ALPRAZolam (XANAX) 1 MG tablet Take 0.5 mg by mouth 3 (three) times daily as needed.     Marland Kitchen aspirin EC 81 MG tablet Take 1 tablet (81 mg total) by mouth daily.  Please continue Aspirin and plavix for 6 months, then plavix daily 30 tablet 6  . atorvastatin (LIPITOR) 80 MG tablet Take 1 tablet (80 mg total) by mouth daily at 6 PM. 90 tablet 3  . cilostazol (PLETAL) 50 MG tablet Take 1 tablet (50 mg total) by mouth 2 (two) times daily. 60 tablet 6  . clopidogrel (PLAVIX) 75 MG tablet Take 1 tablet (75 mg total) by mouth daily. 30 tablet 3  . dexlansoprazole (DEXILANT) 60 MG capsule Take 1 capsule (60 mg total) by mouth daily. 30 capsule 11  . gabapentin (NEURONTIN) 300 MG capsule Take 300 mg by mouth 2 (two) times daily.     Marland Kitchen ibuprofen (ADVIL,MOTRIN) 200 MG tablet Take 200 mg by mouth every 6 (six) hours as needed.    Marland Kitchen lisinopril (PRINIVIL,ZESTRIL) 5 MG tablet Take 1 tablet by  mouth daily.  12  . metFORMIN (GLUCOPHAGE) 1000 MG tablet Take 1 tablet (1,000 mg total) by mouth 2 (two) times daily with a meal. 60 tablet 3  . metoprolol tartrate (LOPRESSOR) 25 MG tablet Take 0.5 tablets (12.5 mg total) by mouth 2 (two) times daily. 90 tablet 1  . nitroGLYCERIN (NITROSTAT) 0.4 MG SL tablet Place 1 tablet (0.4 mg total) under the tongue every 5 (five) minutes as needed for chest pain. 25 tablet 2  . nystatin cream (MYCOSTATIN) Apply 1 application topically 2 (two) times daily as needed.  5  . ondansetron (ZOFRAN) 4 MG tablet Take 1 tablet (4 mg total) by mouth 2 (two) times daily as needed for nausea or vomiting. 20 tablet 0  . oxyCODONE (OXY IR/ROXICODONE) 5 MG immediate release tablet Take 1 tablet by mouth every 4 (four) hours as needed for moderate pain.   0  . dicyclomine (BENTYL) 10 MG capsule Take 1 capsule (10 mg total) by mouth 2 (two) times daily as needed for spasms. 90 capsule 3   No current facility-administered medications on file prior to visit.         Objective:   Physical Exam Blood pressure 100/60, pulse 72, temperature 98.3 F (36.8 C), height 5\' 6"  (1.676 m), weight 157 lb 3.2 oz (71.3 kg). Alert and oriented. Skin warm and dry. Oral mucosa is moist.   . Sclera anicteric, conjunctivae is pink. Thyroid not enlarged. No cervical lymphadenopathy. Lungs clear. Heart regular rate and rhythm.  Abdomen is soft. Bowel sounds are positive. No hepatomegaly. No abdominal masses felt. No tenderness.  No edema to lower extremities.           Assessment & Plan:  IBS. Continue the Dicyclomine twice a day GERD: Continue the Dexilant and take Pepcid 20mg  in evening or bed time.  OV in 1 year

## 2016-10-06 NOTE — Patient Instructions (Signed)
Continue the Dexilant. Dicyclomine 10mg  BID. OV in 1 year.

## 2016-11-29 ENCOUNTER — Ambulatory Visit (INDEPENDENT_AMBULATORY_CARE_PROVIDER_SITE_OTHER): Payer: BLUE CROSS/BLUE SHIELD | Admitting: Cardiology

## 2016-11-29 ENCOUNTER — Encounter: Payer: Self-pay | Admitting: Cardiology

## 2016-11-29 VITALS — BP 160/86 | HR 91 | Ht 66.0 in | Wt 154.0 lb

## 2016-11-29 DIAGNOSIS — E119 Type 2 diabetes mellitus without complications: Secondary | ICD-10-CM

## 2016-11-29 DIAGNOSIS — I2510 Atherosclerotic heart disease of native coronary artery without angina pectoris: Secondary | ICD-10-CM | POA: Diagnosis not present

## 2016-11-29 DIAGNOSIS — E782 Mixed hyperlipidemia: Secondary | ICD-10-CM

## 2016-11-29 DIAGNOSIS — I739 Peripheral vascular disease, unspecified: Secondary | ICD-10-CM

## 2016-11-29 DIAGNOSIS — I10 Essential (primary) hypertension: Secondary | ICD-10-CM

## 2016-11-29 MED ORDER — NICOTINE 21 MG/24HR TD PT24: MEDICATED_PATCH | TRANSDERMAL | 0 refills | Status: DC

## 2016-11-29 NOTE — Progress Notes (Signed)
Clinical Summary Grace Hess is a 61 y.o.female seen today for follow up of the following medical problems.   1. CAD - 07/2016 nuclear stress test shows small moderate intensity apical/anteroseptal defect partially reversible, tid 1.67. Overall high risk study.  -- cath 07/2016 with 60-70% mid LAD, 90% prox RCA. Received DES to RCA. Recs per interventional if continued chest pain could consider FFR guided PCI of mid LAD   - no recent chest pain, SOB or DOE - compliant with meds  2. PAD - pain in bilateral calves with walking, left greater than right. Pain with 1/2 block, worst with incline. Better with rest - ABIs report looks to be mislabled as upper extremity report. ABIs show mild to moderate disease. 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 Bilateral 2 vessel runoff. Could consider intervention if neccesary.   - still with leg pains. Has not started her home walking program yet.   - had diarrhea on cilostazol. She does have a history of IBS and is followed by GI. Also had some dizziness on cilostazol, shaky, Disontionued and resolved.  - still with leg pains at times, aching left calf.This has not been lifestyle limting No sores on feet.   2. Carotid stenosis - history of prior left CEA - history of prior stroke.  - followed by vascular  3. DM2 - followed by pcp.  4. HTN - takes her bp meds daily   5. Hyperlipidemia - muscle aches on atorvastatin. Crestor causes muscle aches. Tried on several others - retried on atorvastatin, has been tolerating   6. Smoking - still smoking.  - bad dreams on chantix in the past   SH: upcoming trip to [place] Past Medical History:  Diagnosis Date  . Anxiety   . Arthritis    "might have a touch in my fingers" (08/02/2016)  . Cervicalgia   . Chest pain, unspecified   . GERD (gastroesophageal reflux  disease)   . Headache    "bad ones after my stroke in 2016; only have them when I get bad news now" (08/02/2016)  . Heart murmur   . Heavy smoker (more than 20 cigarettes per day)    Scheduled for LDCT screening 02/11/15  . Hepatitis A    "related to [employer]"  . History of hiatal hernia   . History of kidney stones    "no cysto/OR" (12/10/2014)  . Hypercholesteremia   . Hypertension   . Obesity   . Reflux esophagitis   . Stroke [facility])    "they said I had a silent stroke a few months back/MRI" (12/10/2014)  . TIA (transient ischemic attack) 11/28/2014   Grace Hess 11/28/2014  . Type II diabetes mellitus ([facility])      Allergies  Allergen Reactions  . Codeine Shortness Of Breath  . Latex Itching  . Sulfa Antibiotics Other (See Comments)    Reaction:  Unknown      Current Outpatient Prescriptions  Medication Sig Dispense Refill  . acetaminophen (TYLENOL) 325 MG tablet Take 2 tablets (650 mg total) by mouth every 4 (four) hours as needed for headache or mild pain.    Marland Kitchen ALPRAZolam (XANAX) 1 MG tablet Take 0.5 mg by mouth 3 (three) times daily as needed.     Marland Kitchen aspirin EC 81 MG tablet Take 1 tablet (81 mg total) by mouth daily. Please continue Aspirin and plavix for  6 months, then plavix daily 30 tablet 6  . atorvastatin (LIPITOR) 80 MG tablet Take 1 tablet (80 mg total) by mouth daily at 6 PM. 90 tablet 3  . clopidogrel (PLAVIX) 75 MG tablet Take 1 tablet (75 mg total) by mouth daily. 30 tablet 3  . dexlansoprazole (DEXILANT) 60 MG capsule Take 1 capsule (60 mg total) by mouth daily. 30 capsule 11  . dicyclomine (BENTYL) 10 MG capsule Take 1 capsule (10 mg total) by mouth 2 (two) times daily. 60 capsule 5  . gabapentin (NEURONTIN) 300 MG capsule Take 300 mg by mouth 2 (two) times daily.     Marland Kitchen ibuprofen (ADVIL,MOTRIN) 200 MG tablet Take 200 mg by mouth every 6 (six) hours as needed.    Marland Kitchen lisinopril (PRINIVIL,ZESTRIL) 5 MG tablet Take 1 tablet by mouth daily.  12  . metFORMIN (GLUCOPHAGE)  1000 MG tablet Take 1 tablet (1,000 mg total) by mouth 2 (two) times daily with a meal. 60 tablet 3  . nitroGLYCERIN (NITROSTAT) 0.4 MG SL tablet Place 1 tablet (0.4 mg total) under the tongue every 5 (five) minutes as needed for chest pain. 25 tablet 2  . nystatin cream (MYCOSTATIN) Apply 1 application topically 2 (two) times daily as needed.  5  . ondansetron (ZOFRAN) 4 MG tablet Take 1 tablet (4 mg total) by mouth 2 (two) times daily as needed for nausea or vomiting. 20 tablet 0  . oxyCODONE (OXY IR/ROXICODONE) 5 MG immediate release tablet Take 1 tablet by mouth every 4 (four) hours as needed for moderate pain.   0  . metoprolol tartrate (LOPRESSOR) 25 MG tablet Take 0.5 tablets (12.5 mg total) by mouth 2 (two) times daily. 90 tablet 1   No current facility-administered medications for this visit.      Past Surgical History:  Procedure Laterality Date  . ABDOMINAL AORTOGRAM N/A 08/02/2016   Procedure: Abdominal Aortogram;  Surgeon: Nelva Bush, MD;  Location: [place] [facility];  Service: Cardiovascular;  Laterality: N/A;  . CHOLECYSTECTOMY OPEN  1978  . CORONARY ANGIOPLASTY WITH STENT PLACEMENT  08/02/2016   to RCA  . CORONARY STENT INTERVENTION N/A 08/02/2016   Procedure: Coronary Stent Intervention;  Surgeon: Nelva Bush, MD;  Location: [place] [facility];  Service: Cardiovascular;  Laterality: N/A;  RCA  . ENDARTERECTOMY Left 12/02/2014   Procedure: ENDARTERECTOMY CAROTID;  Surgeon: Rosetta Posner, MD;  Location: [place];  Service: Vascular;  Laterality: Left;  . LEFT HEART CATH AND CORONARY ANGIOGRAPHY N/A 08/02/2016   Procedure: Left Heart Cath and Coronary Angiography;  Surgeon: Nelva Bush, MD;  Location: [place] [facility];  Service: Cardiovascular;  Laterality: N/A;  . LOWER EXTREMITY ANGIOGRAM  08/02/2016  . LOWER EXTREMITY ANGIOGRAPHY N/A 08/02/2016   Procedure: Lower Extremity Angiography;  Surgeon: Nelva Bush, MD;  Location: [place] [facility];  Service:  Cardiovascular;  Laterality: N/A;  . VAGINAL HYSTERECTOMY  1991   "partial"     Allergies  Allergen Reactions  . Codeine Shortness Of Breath  . Latex Itching  . Sulfa Antibiotics Other (See Comments)    Reaction:  Unknown       Family History  Problem Relation Age of Onset  . Congestive Heart Failure Mother   . COPD Father   . Cancer Brother   . Cancer Brother      Social History Ms. Seybold reports that she has been smoking Cigarettes.  She started smoking about 42 years ago. She has a 86.00 pack-year smoking history.  She has never used smokeless tobacco. Ms. Schatz reports that she does not drink alcohol.   Review of Systems CONSTITUTIONAL: No weight loss, fever, chills, weakness or fatigue.  HEENT: Eyes: No visual loss, blurred vision, double vision or yellow sclerae.No hearing loss, sneezing, congestion, runny nose or sore throat.  SKIN: No rash or itching.  CARDIOVASCULAR: per hpi RESPIRATORY: No shortness of breath, cough or sputum.  GASTROINTESTINAL: No anorexia, nausea, vomiting or diarrhea. No abdominal pain or blood.  GENITOURINARY: No burning on urination, no polyuria NEUROLOGICAL: No headache, dizziness, syncope, paralysis, ataxia, numbness or tingling in the extremities. No change in bowel or bladder control.  MUSCULOSKELETAL: per hpi  LYMPHATICS: No enlarged nodes. No history of splenectomy.  PSYCHIATRIC: No history of depression or anxiety.  ENDOCRINOLOGIC: No reports of sweating, cold or heat intolerance. No polyuria or polydipsia.  Marland Kitchen   Physical Examination Vitals:   11/29/16 1107  BP: (!) 160/86  Pulse: 91  SpO2: 97%   Filed Weights   11/29/16 1107  Weight: 154 lb (69.9 kg)    Gen: resting comfortably, no acute distress HEENT: no scleral icterus, pupils equal round and reactive, no palptable cervical adenopathy,  CV: RRR, no m/r/g, no jvd Resp: Clear to auscultation bilaterally GI: abdomen is soft, non-tender, non-distended, normal bowel  sounds, no hepatosplenomegaly MSK: extremities are warm, no edema.  Skin: warm, no rash Neuro:  no focal deficits Psych: appropriate affect   Diagnostic Studies 11/2014 echo Study Conclusions  - Left ventricle: The cavity size was normal. There was mild focal basal hypertrophy of the septum. Systolic function was normal. The estimated ejection fraction was in the range of 60% to 65%. Wall motion was normal; there were no regional wall motion abnormalities. Doppler parameters are consistent with abnormal left ventricular relaxation (grade 1 diastolic dysfunction). - Aortic valve: Trileaflet; mildly calcified leaflets. There was trivial regurgitation. - Mitral valve: Calcified annulus. - Right atrium: Central venous pressure (est): 3 mm Hg. - Atrial septum: There was increased thickness of the septum, consistent with lipomatous hypertrophy. No defect or patent foramen ovale was identified. - Tricuspid valve: There was trivial regurgitation. - Pulmonary arteries: Systolic pressure could not be accurately estimated. - Pericardium, extracardiac: A prominent pericardial fat pad was present.  Impressions:  - Mild basal septal LV hypertrophy with LVEF 60-65% and grade 1 diastolic dysfunction. Mildly calcified mitral annulus as well as mildly sclerotic aortic valve. Trivial aortic regurgitation. No obvious PFO or ASD.  07/2016 Nuclear stress  No diagnostic ST segment changes to indicate ischemia with Lexiscan.  Small, moderate intensity, apical anteroseptal defect is partially reversible suggestive of a mild ischemic territory. Thr TID ratio is also significantly increased at 1.67 suggesting the possibility of subendocardial or balanced multivessel ischemia.  This is a high risk study based predominantly on the significantly elevated TID ratio.  Nuclear stress EF: 63%.   07/2016 cath  Prox LAD to Mid LAD lesion, 20  %stenosed.  Conclusions: 1. Significant 2 vessel coronary artery disease, including 60-70% mid LAD and 90% proximal RCA stenoses. 2. Mildly elevated left ventricular filling pressure. 3. Aortic atherosclerosis with mild to moderate bilateral common iliac artery disease. 4. 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 5. Bilateral two-vessel runoff. 6. Successful PCI to the proximal RCA with placement of a Promus Premier 2.5 x 16 mm drug-eluting stent with 0% residual stenosis and TIMI-3 flow.  Recommendations: 1. Continue dual antiplatelet therapy with aspirin and clopidogrel for at least 6  months, ideally longer. 2. If patient has recurrent chest pain, consider FFR guided PCI (likely requiring orbital atherectomy) to the mid LAD. 3. Peripheral angiography reviewed with Dr. Gwenlyn Found; endovascular intervention to left SFA could be considered in the future, if claudication persists.    Assessment and Plan  1. CAD - s/p recent stenting - Medically manage moderate LAD lesion. If recurrent symptoms consider FFR PCI of LAD.  - no recent symptoms, conitnue current meds  2. PAD - ongoing claudicaiton, has not been lifestyle limiting.  - did not tolerate cilostazol, stopped. Encouraged to continue walking program at home - follow symptoms, if peristent may consider intervention on left SFA.   3. Hyperlipidemia - currnetly tolerating statin, continue. Repeat lipid panel.   4. DM2 - from cardiovascular standpoint she is on ASA, statin, ACE-I - glycemic control per pcp  5. Tobacco abuse - will give Rx for nicotine patches  6. HTN - elevated in clinic, has not taken meds yet. BP's well controlled at prior recent visits - continue to monitor  F/u 4 months Arnoldo Lenis, M.D.

## 2016-11-29 NOTE — Patient Instructions (Signed)
Medication Instructions:  NICODERM PATCH - USE 1- 21 MG PATCH DAILY FOR 6 WEEKS , THEN USE 1- 14 MG PATCH DAILY FOR 2 WEEKS, THEN USE 1- 7 MG PATCH DAILY FOR 2 WEEKS   Labwork: FASTING LIPID   Testing/Procedures: NONE  Follow-Up: Your physician recommends that you schedule a follow-up appointment in: 4 MONTHS    Any Other Special Instructions Will Be Listed Below (If Applicable).     If you need a refill on your cardiac medications before your next appointment, please call your pharmacy.

## 2016-12-14 DIAGNOSIS — R05 Cough: Secondary | ICD-10-CM | POA: Diagnosis not present

## 2016-12-14 DIAGNOSIS — Z6827 Body mass index (BMI) 27.0-27.9, adult: Secondary | ICD-10-CM | POA: Diagnosis not present

## 2017-01-18 ENCOUNTER — Encounter: Payer: Self-pay | Admitting: Family

## 2017-01-18 ENCOUNTER — Ambulatory Visit (HOSPITAL_COMMUNITY)
Admission: RE | Admit: 2017-01-18 | Discharge: 2017-01-18 | Disposition: A | Discharge status: Home / Self Care | Payer: BLUE CROSS/BLUE SHIELD | Source: Ambulatory Visit | Attending: Vascular Surgery | Admitting: Vascular Surgery

## 2017-01-18 ENCOUNTER — Ambulatory Visit: Payer: BLUE CROSS/BLUE SHIELD | Admitting: Family

## 2017-01-18 VITALS — BP 153/80 | HR 72 | Temp 97.0°F | Resp 18 | Ht 66.0 in | Wt 156.0 lb

## 2017-01-18 DIAGNOSIS — I6523 Occlusion and stenosis of bilateral carotid arteries: Secondary | ICD-10-CM | POA: Insufficient documentation

## 2017-01-18 DIAGNOSIS — F17200 Nicotine dependence, unspecified, uncomplicated: Secondary | ICD-10-CM | POA: Diagnosis not present

## 2017-01-18 LAB — VAS US CAROTID
LEFT ECA DIAS: -12 cm/s
LEFT VERTEBRAL DIAS: 14 cm/s
LICAPDIAS: -31 cm/s
Left CCA dist dias: -49 cm/s
Left CCA dist sys: -173 cm/s
Left CCA prox dias: 22 cm/s
Left CCA prox sys: 92 cm/s
Left ICA dist dias: -20 cm/s
Left ICA dist sys: -57 cm/s
Left ICA prox sys: -96 cm/s
RCCAPDIAS: 28 cm/s
RCCAPSYS: 130 cm/s
RIGHT CCA MID DIAS: 30 cm/s
RIGHT ECA DIAS: -43 cm/s
RIGHT VERTEBRAL DIAS: 21 cm/s
Right cca dist sys: 130 cm/s

## 2017-01-18 NOTE — Patient Instructions (Addendum)
Stroke Prevention Some health problems and behaviors may make it more likely for you to have a stroke. Below are ways to lessen your risk of having a stroke.  Be active for at least 30 minutes on most or all days.  Do not smoke. Try not to be around others who smoke.  Do not drink too much alcohol. ? Do not have more than 2 drinks a day if you are a man. ? Do not have more than 1 drink a day if you are a woman and are not pregnant.  Eat healthy foods, such as fruits and vegetables. If you were put on a specific diet, follow the diet as told.  Keep your cholesterol levels under control through diet and medicines. Look for foods that are low in saturated fat, trans fat, cholesterol, and are high in fiber.  If you have diabetes, follow all diet plans and take your medicine as told.  Ask your doctor if you need treatment to lower your blood pressure. If you have high blood pressure (hypertension), follow all diet plans and take your medicine as told by your doctor.  If you are 53-40 years old, have your blood pressure checked every 3-5 years. If you are age 2 or older, have your blood pressure checked every year.  Keep a healthy weight. Eat foods that are low in calories, salt, saturated fat, trans fat, and cholesterol.  Do not take drugs.  Avoid birth control pills, if this applies. Talk to your doctor about the risks of taking birth control pills.  Talk to your doctor if you have sleep problems (sleep apnea).  Take all medicine as told by your doctor. ? You may be told to take aspirin or blood thinner medicine. Take this medicine as told by your doctor. ? Understand your medicine instructions.  Make sure any other conditions you have are being taken care of.  Get help right away if:  You suddenly lose feeling (you feel numb) or have weakness in your face, arm, or leg.  Your face or eyelid hangs down to one side.  You suddenly feel confused.  You have trouble talking  (aphasia) or understanding what people are saying.  You suddenly have trouble seeing in one or both eyes.  You suddenly have trouble walking.  You are dizzy.  You lose your balance or your movements are clumsy (uncoordinated).  You suddenly have a very bad headache and you do not know the cause.  You have new chest pain.  Your heart feels like it is fluttering or skipping a beat (irregular heartbeat). Do not wait to see if the symptoms above go away. Get help right away. Call your local emergency services (911 in [place] Do not drive yourself to the hospital. This information is not intended to replace advice given to you by your health care provider. Make sure you discuss any questions you have with your health care provider. Document Released: 08/31/2011 Document Revised: 08/07/2015 Document Reviewed: 09/01/2012 Elsevier Interactive Patient Education  2018 [employer].      Steps to Quit Smoking Smoking tobacco can be bad for your health. It can also affect almost every organ in your body. Smoking puts you and people around you at risk for many serious long-lasting (chronic) diseases. Quitting smoking is hard, but it is one of the best things that you can do for your health. It is never too late to quit. What are the benefits of quitting smoking? When you quit smoking, you lower  your risk for getting serious diseases and conditions. They can include:  Lung cancer or lung disease.  Heart disease.  Stroke.  Heart attack.  Not being able to have children (infertility).  Weak bones (osteoporosis) and broken bones (fractures).  If you have coughing, wheezing, and shortness of breath, those symptoms may get better when you quit. You may also get sick less often. If you are pregnant, quitting smoking can help to lower your chances of having a baby of low birth weight. What can I do to help me quit smoking? Talk with your doctor about what can help you quit smoking. Some things  you can do (strategies) include:  Quitting smoking totally, instead of slowly cutting back how much you smoke over a period of time.  Going to in-person counseling. You are more likely to quit if you go to many counseling sessions.  Using resources and support systems, such as: ? Database administrator with a Social worker. ? Phone quitlines. ? Careers information officer. ? Support groups or group counseling. ? Text messaging programs. ? Mobile phone apps or applications.  Taking medicines. Some of these medicines may have nicotine in them. If you are pregnant or breastfeeding, do not take any medicines to quit smoking unless your doctor says it is okay. Talk with your doctor about counseling or other things that can help you.  Talk with your doctor about using more than one strategy at the same time, such as taking medicines while you are also going to in-person counseling. This can help make quitting easier. What things can I do to make it easier to quit? Quitting smoking might feel very hard at first, but there is a lot that you can do to make it easier. Take these steps:  Talk to your family and friends. Ask them to support and encourage you.  Call phone quitlines, reach out to support groups, or work with a Social worker.  Ask people who smoke to not smoke around you.  Avoid places that make you want (trigger) to smoke, such as: ? Bars. ? Parties. ? Smoke-break areas at work.  Spend time with people who do not smoke.  Lower the stress in your life. Stress can make you want to smoke. Try these things to help your stress: ? Getting regular exercise. ? Deep-breathing exercises. ? Yoga. ? Meditating. ? Doing a body scan. To do this, close your eyes, focus on one area of your body at a time from head to toe, and notice which parts of your body are tense. Try to relax the muscles in those areas.  Download or buy apps on your mobile phone or tablet that can help you stick to your quit plan.  There are many free apps, such as QuitGuide from the [employer] Office manager for Disease Control and Prevention). You can find more support from smokefree.gov and other websites.  This information is not intended to replace advice given to you by your health care provider. Make sure you discuss any questions you have with your health care provider. Document Released: 12/26/2008 Document Revised: 10/28/2015 Document Reviewed: 07/16/2014 Elsevier Interactive Patient Education  2018 [employer].

## 2017-01-18 NOTE — Progress Notes (Signed)
Chief Complaint: Follow up Extracranial Carotid Artery Stenosis   History of Present Illness  Grace Hess is a 61 y.o. female returns for follow-up of left carotid endarterectomy on 12-02-2014 by Dr. Donnetta Hutching. She had a preoperative neurologic event. She remains asymptomatic at this time; no new amaurosis fugax, transient ischemic attack or stroke. She reports that that one son died in Jul 05, 2014 and another son had a coronary bypass after a significant heart attack several months prior.  Dr. Donnetta Hutching last evaluated pt on 12-23-15. At that time pt was stable after left carotid endarterectomy 1 year prior. Continue her usual activities. She is to follow up in one year with a carotid duplex evaluation.   07/2016 peripheral angio by Dr. 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 Bilateral 2 vessel runoff. Could consider intervention if neccesary. Pt states she tried medication which sounds like cilostizol, she stopped it due to diarrhea.   Pt Diabetic: yes, pt states her last A1C result was 8.? Pt smoker: smoker  (2 ppd, started at about age 41 yrs) She states she lost her son not too long ago, and continues to smoke mostly due to this  Pt meds include: Statin : yes ASA: yes Other anticoagulants/antiplatelets: Plavix   Past Medical History:  Diagnosis Date  . Anxiety   . Arthritis    "might have a touch in my fingers" (08/02/2016)  . Cervicalgia   . Chest pain, unspecified   . GERD (gastroesophageal reflux disease)   . Headache    "bad ones after my stroke in 2014/07/05; only have them when I get bad news now" (08/02/2016)  . Heart murmur   . Heavy smoker (more than 20 cigarettes per day)    Scheduled for LDCT screening 02/11/15  . Hepatitis A    "related to [employer]"  . History of hiatal hernia   . History of kidney stones    "no cysto/OR" (12/10/2014)  . Hypercholesteremia   . Hypertension   . Obesity   . Reflux esophagitis   . Stroke  [facility])    "they said I had a silent stroke a few months back/MRI" (12/10/2014)  . TIA (transient ischemic attack) 11/28/2014   [name] 11/28/2014  . Type II diabetes mellitus ([place])     Social History Social History   Tobacco Use  . Smoking status: Current Every Day Smoker    Packs/day: 2.00    Years: 43.00    Pack years: 86.00    Types: Cigarettes    Start date: 05/17/1974  . Smokeless tobacco: Never Used  Substance Use Topics  . Alcohol use: No    Alcohol/week: 0.0 oz  . Drug use: No    Family History Family History  Problem Relation Age of Onset  . Congestive Heart Failure Mother   . COPD Father   . Cancer Brother   . Cancer Brother     Surgical History Past Surgical History:  Procedure Laterality Date  . CHOLECYSTECTOMY OPEN  1978  . CORONARY ANGIOPLASTY WITH STENT PLACEMENT  08/02/2016   to RCA  . LOWER EXTREMITY ANGIOGRAM  08/02/2016  . VAGINAL HYSTERECTOMY  1991   "partial"    Allergies  Allergen Reactions  . Codeine Shortness Of Breath  . Latex Itching  . Sulfa Antibiotics Other (See Comments)    Reaction:  Unknown     Current Outpatient Medications  Medication Sig Dispense Refill  . acetaminophen (TYLENOL) 325 MG tablet Take 2  tablets (650 mg total) by mouth every 4 (four) hours as needed for headache or mild pain.    Marland Kitchen ALPRAZolam (XANAX) 1 MG tablet Take 0.5 mg by mouth 3 (three) times daily as needed.     Marland Kitchen aspirin EC 81 MG tablet Take 1 tablet (81 mg total) by mouth daily. Please continue Aspirin and plavix for 6 months, then plavix daily 30 tablet 6  . atorvastatin (LIPITOR) 80 MG tablet Take 1 tablet (80 mg total) by mouth daily at 6 PM. 90 tablet 3  . clopidogrel (PLAVIX) 75 MG tablet Take 1 tablet (75 mg total) by mouth daily. 30 tablet 3  . dexlansoprazole (DEXILANT) 60 MG capsule Take 1 capsule (60 mg total) by mouth daily. 30 capsule 11  . dicyclomine (BENTYL) 10 MG capsule Take 1 capsule (10 mg total) by mouth 2 (two) times daily. 60 capsule 5   . gabapentin (NEURONTIN) 300 MG capsule Take 300 mg by mouth 2 (two) times daily.     Marland Kitchen ibuprofen (ADVIL,MOTRIN) 200 MG tablet Take 200 mg by mouth every 6 (six) hours as needed.    Marland Kitchen lisinopril (PRINIVIL,ZESTRIL) 5 MG tablet Take 1 tablet by mouth daily.  12  . metFORMIN (GLUCOPHAGE) 1000 MG tablet Take 1 tablet (1,000 mg total) by mouth 2 (two) times daily with a meal. 60 tablet 3  . nicotine (NICODERM CQ) 21 mg/24hr patch USE 1- 21 MG PATCH DAILY FOR 6 WEEKS, THEN DECREASE TO 14 MG DAILY FOR 2 WEEKS, THEN DECREASE TO 7 MG DAILY FOR 2 WEEKS. 70 patch 0  . nitroGLYCERIN (NITROSTAT) 0.4 MG SL tablet Place 1 tablet (0.4 mg total) under the tongue every 5 (five) minutes as needed for chest pain. 25 tablet 2  . nystatin cream (MYCOSTATIN) Apply 1 application topically 2 (two) times daily as needed.  5  . ondansetron (ZOFRAN) 4 MG tablet Take 1 tablet (4 mg total) by mouth 2 (two) times daily as needed for nausea or vomiting. 20 tablet 0  . oxyCODONE (OXY IR/ROXICODONE) 5 MG immediate release tablet Take 1 tablet by mouth every 4 (four) hours as needed for moderate pain.   0  . metoprolol tartrate (LOPRESSOR) 25 MG tablet Take 0.5 tablets (12.5 mg total) by mouth 2 (two) times daily. 90 tablet 1   No current facility-administered medications for this visit.     Review of Systems : See HPI for pertinent positives and negatives.  Physical Examination  Vitals:   01/18/17 1056 01/18/17 1057  BP: (!) 173/81 (!) 153/80  Pulse: 72   Resp: 18   Temp: (!) 97 F (36.1 C)   TempSrc: Oral   SpO2: 96%   Weight: 156 lb (70.8 kg)   Height: 5\' 6"  (1.676 m)    Body mass index is 25.18 kg/m.  General: WDWN female in NAD GAIT: normal Eyes: PERRLA Pulmonary:  Respirations are non-labored, good air movement, CTAB, no rales, rhonchi, or wheezing.  Cardiac: regular rhythm,  no detected murmur.  VASCULAR EXAM Carotid Bruits Right Left   Negative Negative     Abdominal aortic pulse is not  palpable. Radial pulses are 2+ palpable and equal.  LE Pulses Right Left       POPLITEAL  not palpable   not palpable       POSTERIOR TIBIAL  not palpable   not palpable        DORSALIS PEDIS      ANTERIOR TIBIAL 2+ palpable  not palpable     Gastrointestinal: soft, nontender, BS WNL, no r/g, no palpable masses.  Musculoskeletal: No muscle atrophy/wasting. M/S 5/5 throughout, extremities without ischemic changes  Skin: No rashes, no ulcers, no cellulitis.    Neurologic:  A&O X 3; appropriate affect, sensation is normal; speech is normal, CN 2-12 intact, pain and light touch intact in extremities, motor exam as listed above.    Assessment: ROBERTINE KIPPER is a 61 y.o. female who is s/p left carotid endarterectomy on 12-02-2014. She had a preoperative stroke, has no neurological deficits, has not had any subsequent stroke or TIA.   Her atherosclertoic risk factors include 2 ppd smoker and uncontrolled DM.  DATA Carotid Duplex (01/18/17): Right ICA: 60-79% stenosis. Left ICA: CEA site with no restenosis. Right ECA with >50% stenosis.  Bilateral vertebral artery flow is antegrade.  Bilateral subclavian artery waveforms are normal.  Increased stenosis in the right ICA compared to the exam on 01-02-16.    Plan: Follow-up in 57months with Carotid Duplex scan.   The patient was counseled re smoking cessation and given several free resources re smoking cessation.    I discussed in depth with the patient the nature of atherosclerosis, and emphasized the importance of maximal medical management including strict control of blood pressure, blood glucose, and lipid levels, obtaining regular exercise, and cessation of smoking.  The patient is aware that without maximal medical management the underlying atherosclerotic disease process will progress, limiting the  benefit of any interventions. The patient was given information about stroke prevention and what symptoms should prompt the patient to seek immediate medical care. Thank you for allowing [place] to participate in this patient's care.  Clemon Chambers, RN, MSN, FNP-C [facility] Office: [phone]  Clinic Physician: Early  01/18/17 3:51 PM

## 2017-01-19 ENCOUNTER — Other Ambulatory Visit: Payer: Self-pay

## 2017-01-19 DIAGNOSIS — I6529 Occlusion and stenosis of unspecified carotid artery: Secondary | ICD-10-CM

## 2017-01-19 NOTE — Addendum Note (Signed)
Addended by: Lianne Cure A on: 01/19/2017 12:49 PM   Modules accepted: Orders

## 2017-01-22 ENCOUNTER — Other Ambulatory Visit: Payer: Self-pay | Admitting: Cardiology

## 2017-02-01 DIAGNOSIS — Z79891 Long term (current) use of opiate analgesic: Secondary | ICD-10-CM | POA: Diagnosis not present

## 2017-03-15 ENCOUNTER — Other Ambulatory Visit (INDEPENDENT_AMBULATORY_CARE_PROVIDER_SITE_OTHER): Payer: Self-pay | Admitting: Internal Medicine

## 2017-03-16 DIAGNOSIS — I739 Peripheral vascular disease, unspecified: Secondary | ICD-10-CM | POA: Diagnosis not present

## 2017-03-16 DIAGNOSIS — F17200 Nicotine dependence, unspecified, uncomplicated: Secondary | ICD-10-CM | POA: Diagnosis not present

## 2017-03-16 DIAGNOSIS — E119 Type 2 diabetes mellitus without complications: Secondary | ICD-10-CM | POA: Diagnosis not present

## 2017-03-16 DIAGNOSIS — I10 Essential (primary) hypertension: Secondary | ICD-10-CM | POA: Diagnosis not present

## 2017-03-22 ENCOUNTER — Other Ambulatory Visit: Payer: Self-pay

## 2017-03-22 DIAGNOSIS — E119 Type 2 diabetes mellitus without complications: Secondary | ICD-10-CM | POA: Diagnosis not present

## 2017-03-22 DIAGNOSIS — E7800 Pure hypercholesterolemia, unspecified: Secondary | ICD-10-CM | POA: Diagnosis not present

## 2017-03-22 DIAGNOSIS — I739 Peripheral vascular disease, unspecified: Secondary | ICD-10-CM | POA: Diagnosis not present

## 2017-03-22 DIAGNOSIS — Z79891 Long term (current) use of opiate analgesic: Secondary | ICD-10-CM | POA: Diagnosis not present

## 2017-03-22 DIAGNOSIS — I10 Essential (primary) hypertension: Secondary | ICD-10-CM | POA: Diagnosis not present

## 2017-03-22 LAB — LIPID PANEL
Cholesterol: 173 mg/dL (ref <200)
HDL: 40 mg/dL — ABNORMAL LOW (ref >50)
LDL CHOLESTEROL (CALC): 102 mg/dL — AB
NON-HDL CHOLESTEROL (CALC): 133 mg/dL — AB (ref <130)
TRIGLYCERIDES: 185 mg/dL — AB (ref <150)
Total CHOL/HDL Ratio: 4.3 (calc) (ref <5.0)

## 2017-03-28 ENCOUNTER — Ambulatory Visit (INDEPENDENT_AMBULATORY_CARE_PROVIDER_SITE_OTHER): Payer: BLUE CROSS/BLUE SHIELD | Admitting: Internal Medicine

## 2017-03-28 ENCOUNTER — Encounter (INDEPENDENT_AMBULATORY_CARE_PROVIDER_SITE_OTHER): Payer: Self-pay | Admitting: Internal Medicine

## 2017-03-28 ENCOUNTER — Telehealth: Payer: Self-pay | Admitting: *Deleted

## 2017-03-28 VITALS — BP 152/92 | HR 74 | Temp 98.4°F | Resp 18 | Ht 66.0 in | Wt 156.7 lb

## 2017-03-28 DIAGNOSIS — K219 Gastro-esophageal reflux disease without esophagitis: Secondary | ICD-10-CM | POA: Diagnosis not present

## 2017-03-28 DIAGNOSIS — K580 Irritable bowel syndrome with diarrhea: Secondary | ICD-10-CM

## 2017-03-28 DIAGNOSIS — H6501 Acute serous otitis media, right ear: Secondary | ICD-10-CM | POA: Diagnosis not present

## 2017-03-28 MED ORDER — DICYCLOMINE HCL 10 MG PO CAPS: 10.0000 mg | ORAL_CAPSULE | Freq: Two times a day (BID) | ORAL | 5 refills | Status: DC

## 2017-03-28 MED ORDER — AMOXICILLIN-POT CLAVULANATE 875-125 MG PO TABS: 1.0000 | ORAL_TABLET | Freq: Two times a day (BID) | ORAL | 0 refills | Status: DC

## 2017-03-28 MED ORDER — ROSUVASTATIN CALCIUM 40 MG PO TABS: 40.0000 mg | ORAL_TABLET | Freq: Every day | ORAL | 3 refills | Status: DC

## 2017-03-28 NOTE — Patient Instructions (Signed)
Notify if ear symptoms not resolved by the end of the week.

## 2017-03-28 NOTE — Progress Notes (Signed)
Presenting complaint;  Follow-up for IBS and GERD. Patient complains of bloody drainage from right ear.  Subjective:  Patient is 62 year old Caucasian female who is here for scheduled visit.  She was last seen on 10/06/2016.  She states she is doing good as rested GERD and IBS symptoms are concerned.  She generally has 2-3 bowel movements per day.  Most of her stools are formed.  She may even skip a day every now and then.  She denies abdominal pain melena or rectal bleeding.  She has chronic low back pain because of disc disease as well as neuropathy for which she takes gabapentin and pain medication. Patient reports coughing for the last 2 days and she is felt congested and since yesterday she is noted some bleeding from the right ear along with mucopurulent discharge.  She denies headache fever chills or shortness of breath.   Current Medications: Outpatient Encounter Medications as of 03/28/2017  Medication Sig  . acetaminophen (TYLENOL) 325 MG tablet Take 2 tablets (650 mg total) by mouth every 4 (four) hours as needed for headache or mild pain.  Marland Kitchen ALPRAZolam (XANAX) 1 MG tablet Take 0.5 mg by mouth 3 (three) times daily as needed.   Marland Kitchen aspirin EC 81 MG tablet Take 1 tablet (81 mg total) by mouth daily. Please continue Aspirin and plavix for 6 months, then plavix daily  . clopidogrel (PLAVIX) 75 MG tablet Take 1 tablet (75 mg total) by mouth daily.  Marland Kitchen DEXILANT 60 MG capsule TAKE 1 CAPSULE BY MOUTH DAILY  . dicyclomine (BENTYL) 10 MG capsule Take 1 capsule (10 mg total) by mouth 2 (two) times daily.  Marland Kitchen gabapentin (NEURONTIN) 300 MG capsule Take 300 mg by mouth 2 (two) times daily.   Marland Kitchen glipiZIDE (GLUCOTROL) 5 MG tablet TK 1 T PO QD  . lisinopril (PRINIVIL,ZESTRIL) 5 MG tablet Take 1 tablet by mouth daily.  . metFORMIN (GLUCOPHAGE) 1000 MG tablet Take 1 tablet (1,000 mg total) by mouth 2 (two) times daily with a meal.  . metoprolol tartrate (LOPRESSOR) 25 MG tablet TAKE 1/2 TABLET BY MOUTH  TWICE DAILY  . nicotine (NICODERM CQ) 21 mg/24hr patch USE 1- 21 MG PATCH DAILY FOR 6 WEEKS, THEN DECREASE TO 14 MG DAILY FOR 2 WEEKS, THEN DECREASE TO 7 MG DAILY FOR 2 WEEKS.  . nitroGLYCERIN (NITROSTAT) 0.4 MG SL tablet Place 1 tablet (0.4 mg total) under the tongue every 5 (five) minutes as needed for chest pain.  Marland Kitchen nystatin cream (MYCOSTATIN) Apply 1 application topically 2 (two) times daily as needed.  . ondansetron (ZOFRAN) 4 MG tablet Take 1 tablet (4 mg total) by mouth 2 (two) times daily as needed for nausea or vomiting.  Marland Kitchen oxyCODONE (OXY IR/ROXICODONE) 5 MG immediate release tablet Take 1 tablet by mouth every 4 (four) hours as needed for moderate pain.   . rosuvastatin (CRESTOR) 40 MG tablet Take 1 tablet (40 mg total) by mouth daily.  . [DISCONTINUED] ibuprofen (ADVIL,MOTRIN) 200 MG tablet Take 200 mg by mouth every 6 (six) hours as needed.  . [DISCONTINUED] ondansetron (ZOFRAN) 4 MG tablet TAKE 1 TABLET(4 MG) BY MOUTH TWICE DAILY AS NEEDED FOR NAUSEA OR VOMITING (Patient not taking: Reported on 03/28/2017)   No facility-administered encounter medications on file as of 03/28/2017.      Objective: Blood pressure (!) 152/92, pulse 74, temperature 98.4 F (36.9 C), temperature source Oral, resp. rate 18, height 5\' 6"  (1.676 m), weight 156 lb 11.2 oz (71.1 kg). Patient is alert  and in no acute distress. She is coughing periodically. Left eardrum is unremarkable. Right external auditory canal has some mucopurulent material with some blood.  Blood is also according the eardrum. Conjunctiva is pink. Sclera is nonicteric Oropharyngeal mucosa is normal. No neck masses or thyromegaly noted. Cardiac exam with regular rhythm normal S1 and S2. No murmur or gallop noted. Lungs are clear to auscultation. Abdomen;  No LE edema or clubbing noted.   Assessment:  #1.  IBS with diarrhea.  She is doing well with dicyclomine.   #2.  GERD.  Once again she is doing well with therapy.  Will  consider dropping PPI dose on her next visit.  #3.  Acute right-sided otitis media.   Plan:  Amoxicillin-clavulanate 875-125 1 tablet p.o. twice daily for 1 week. Follow with Dr. Luan Pulling if symptoms persist despite antibiotic therapy. New prescription given for dicyclomine

## 2017-03-28 NOTE — Telephone Encounter (Signed)
-----   Message from Arnoldo Lenis, MD sent at 03/28/2017 11:10 AM EST ----- Cholesterol is too high, please stop atorva 80 and start crestor 40mg  dialy  Zandra Abts MD

## 2017-04-19 ENCOUNTER — Other Ambulatory Visit (INDEPENDENT_AMBULATORY_CARE_PROVIDER_SITE_OTHER): Payer: Self-pay | Admitting: Internal Medicine

## 2017-04-23 ENCOUNTER — Other Ambulatory Visit: Payer: Self-pay | Admitting: Cardiology

## 2017-05-03 ENCOUNTER — Other Ambulatory Visit (INDEPENDENT_AMBULATORY_CARE_PROVIDER_SITE_OTHER): Payer: Self-pay | Admitting: Internal Medicine

## 2017-05-06 DIAGNOSIS — H6521 Chronic serous otitis media, right ear: Secondary | ICD-10-CM | POA: Diagnosis not present

## 2017-05-06 DIAGNOSIS — H9011 Conductive hearing loss, unilateral, right ear, with unrestricted hearing on the contralateral side: Secondary | ICD-10-CM | POA: Diagnosis not present

## 2017-05-06 DIAGNOSIS — Z0110 Encounter for examination of ears and hearing without abnormal findings: Secondary | ICD-10-CM | POA: Diagnosis not present

## 2017-06-06 DIAGNOSIS — H9011 Conductive hearing loss, unilateral, right ear, with unrestricted hearing on the contralateral side: Secondary | ICD-10-CM | POA: Diagnosis not present

## 2017-06-06 DIAGNOSIS — H6981 Other specified disorders of Eustachian tube, right ear: Secondary | ICD-10-CM | POA: Diagnosis not present

## 2017-06-14 DIAGNOSIS — E1165 Type 2 diabetes mellitus with hyperglycemia: Secondary | ICD-10-CM | POA: Diagnosis not present

## 2017-06-14 DIAGNOSIS — M545 Low back pain: Secondary | ICD-10-CM | POA: Diagnosis not present

## 2017-06-14 DIAGNOSIS — I10 Essential (primary) hypertension: Secondary | ICD-10-CM | POA: Diagnosis not present

## 2017-06-14 DIAGNOSIS — E785 Hyperlipidemia, unspecified: Secondary | ICD-10-CM | POA: Diagnosis not present

## 2017-07-19 ENCOUNTER — Other Ambulatory Visit (INDEPENDENT_AMBULATORY_CARE_PROVIDER_SITE_OTHER): Payer: Self-pay | Admitting: Internal Medicine

## 2017-07-19 ENCOUNTER — Ambulatory Visit: Payer: BLUE CROSS/BLUE SHIELD | Admitting: Family

## 2017-07-19 ENCOUNTER — Encounter (HOSPITAL_COMMUNITY): Payer: Self-pay

## 2017-09-02 ENCOUNTER — Other Ambulatory Visit (INDEPENDENT_AMBULATORY_CARE_PROVIDER_SITE_OTHER): Payer: Self-pay | Admitting: Internal Medicine

## 2017-09-09 ENCOUNTER — Ambulatory Visit: Payer: BLUE CROSS/BLUE SHIELD | Admitting: Family

## 2017-09-09 ENCOUNTER — Encounter (HOSPITAL_COMMUNITY): Payer: Self-pay

## 2017-09-13 DIAGNOSIS — I10 Essential (primary) hypertension: Secondary | ICD-10-CM | POA: Diagnosis not present

## 2017-09-13 DIAGNOSIS — E1165 Type 2 diabetes mellitus with hyperglycemia: Secondary | ICD-10-CM | POA: Diagnosis not present

## 2017-09-13 DIAGNOSIS — M545 Low back pain: Secondary | ICD-10-CM | POA: Diagnosis not present

## 2017-09-13 DIAGNOSIS — E785 Hyperlipidemia, unspecified: Secondary | ICD-10-CM | POA: Diagnosis not present

## 2017-09-24 ENCOUNTER — Other Ambulatory Visit (INDEPENDENT_AMBULATORY_CARE_PROVIDER_SITE_OTHER): Payer: Self-pay | Admitting: Internal Medicine

## 2017-10-04 ENCOUNTER — Ambulatory Visit (INDEPENDENT_AMBULATORY_CARE_PROVIDER_SITE_OTHER): Payer: BLUE CROSS/BLUE SHIELD | Admitting: Internal Medicine

## 2017-10-04 ENCOUNTER — Encounter (INDEPENDENT_AMBULATORY_CARE_PROVIDER_SITE_OTHER): Payer: Self-pay | Admitting: Internal Medicine

## 2017-10-04 VITALS — BP 122/74 | HR 66 | Temp 97.4°F | Resp 18 | Ht 66.0 in | Wt 160.9 lb

## 2017-10-04 DIAGNOSIS — K580 Irritable bowel syndrome with diarrhea: Secondary | ICD-10-CM

## 2017-10-04 DIAGNOSIS — K219 Gastro-esophageal reflux disease without esophagitis: Secondary | ICD-10-CM

## 2017-10-04 MED ORDER — LANSOPRAZOLE 30 MG PO CPDR: 30.0000 mg | DELAYED_RELEASE_CAPSULE | Freq: Every day | ORAL | 11 refills | Status: DC

## 2017-10-04 NOTE — Progress Notes (Signed)
Presenting complaint;  Follow-up for GERD and IBS.  Subjective:  Patient is 62 year old Caucasian female who is here for scheduled visit.  She was last seen in January this year.  She has been on Dex lansoprazole for about 10 years.  She states it has worked better than most of the PPIs but now her co-pay is too high and she cannot afford it anymore.  Therefore she is willing to try another PPI.  She denies dysphagia nausea or vomiting.  She remains with intermittent diarrhea.  She has anywhere from 0-5 bowel movements per day.  On most days she has 2-3.  She states she had one accident recently at night.  She denies abdominal pain melena or rectal bleeding.  She has good appetite and her weight is up by 4 pounds. She is on oxycodone for back pain.  Current Medications: Outpatient Encounter Medications as of 10/04/2017  Medication Sig  . acetaminophen (TYLENOL) 325 MG tablet Take 2 tablets (650 mg total) by mouth every 4 (four) hours as needed for headache or mild pain.  Marland Kitchen ALPRAZolam (XANAX) 1 MG tablet Take 0.5 mg by mouth 3 (three) times daily as needed.   Marland Kitchen aspirin EC 81 MG tablet Take 1 tablet (81 mg total) by mouth daily. Please continue Aspirin and plavix for 6 months, then plavix daily  . clopidogrel (PLAVIX) 75 MG tablet Take 1 tablet (75 mg total) by mouth daily.  Marland Kitchen DEXILANT 60 MG capsule TAKE 1 CAPSULE BY MOUTH DAILY  . dicyclomine (BENTYL) 10 MG capsule Take 1 capsule (10 mg total) by mouth 2 (two) times daily.  Marland Kitchen gabapentin (NEURONTIN) 300 MG capsule Take 300 mg by mouth 2 (two) times daily.   Marland Kitchen glipiZIDE (GLUCOTROL) 5 MG tablet TK 1 T PO QD  . lisinopril (PRINIVIL,ZESTRIL) 5 MG tablet Take 1 tablet by mouth daily.  . metFORMIN (GLUCOPHAGE) 1000 MG tablet Take 1 tablet (1,000 mg total) by mouth 2 (two) times daily with a meal.  . metoprolol tartrate (LOPRESSOR) 25 MG tablet TAKE 1/2 TABLET BY MOUTH TWICE DAILY  . nitroGLYCERIN (NITROSTAT) 0.4 MG SL tablet Place 0.4 mg under the  tongue every 5 (five) minutes as needed for chest pain.  Marland Kitchen nystatin cream (MYCOSTATIN) Apply 1 application topically 2 (two) times daily as needed.  . ondansetron (ZOFRAN) 4 MG tablet TAKE 1 TABLET(4 MG) BY MOUTH TWICE DAILY AS NEEDED FOR NAUSEA OR VOMITING  . oxyCODONE (OXY IR/ROXICODONE) 5 MG immediate release tablet Take 1 tablet by mouth every 4 (four) hours as needed for moderate pain.   . rosuvastatin (CRESTOR) 40 MG tablet Take 20 mg by mouth daily.  . [DISCONTINUED] ondansetron (ZOFRAN) 4 MG tablet TAKE 1 TABLET(4 MG) BY MOUTH TWICE DAILY AS NEEDED FOR NAUSEA OR VOMITING  . nicotine (NICODERM CQ) 21 mg/24hr patch USE 1- 21 MG PATCH DAILY FOR 6 WEEKS, THEN DECREASE TO 14 MG DAILY FOR 2 WEEKS, THEN DECREASE TO 7 MG DAILY FOR 2 WEEKS. (Patient not taking: Reported on 10/04/2017)  . nitroGLYCERIN (NITROSTAT) 0.4 MG SL tablet Place 1 tablet (0.4 mg total) under the tongue every 5 (five) minutes as needed for chest pain.  . rosuvastatin (CRESTOR) 40 MG tablet Take 1 tablet (40 mg total) by mouth daily.  . [DISCONTINUED] amoxicillin-clavulanate (AUGMENTIN) 875-125 MG tablet TAKE 1 TABLET BY MOUTH TWICE DAILY (Patient not taking: Reported on 10/04/2017)  . [DISCONTINUED] ondansetron (ZOFRAN) 4 MG tablet TAKE 1 TABLET(4 MG) BY MOUTH TWICE DAILY AS NEEDED FOR NAUSEA OR  VOMITING   No facility-administered encounter medications on file as of 10/04/2017.      Objective: Blood pressure 122/74, pulse 66, temperature (!) 97.4 F (36.3 C), temperature source Oral, resp. rate 18, height 5\' 6"  (1.676 m), weight 160 lb 14.4 oz (73 kg). Patient is alert and in no acute distress. Conjunctiva is pink. Sclera is nonicteric Oropharyngeal mucosa is normal. No neck masses or thyromegaly noted. Cardiac exam with regular rhythm normal S1 and S2. No murmur or gallop noted. Lungs are clear to auscultation. Abdomen is full but soft and nontender with organomegaly or masses. No LE edema or clubbing  noted.   Assessment:  #1.  Chronic GERD.  She is doing very well with dexlansoprazole but she cannot afford this medication anymore.  Therefore we will try her on a different PPI.  #2.  Irritable bowel syndrome with diarrhea.  She is doing reasonably well with dicyclomine.   Plan:  Discontinue dexlansoprazole. Begin lansoprazole 30 mg by mouth 30 minutes before evening meal. Patient will continue take clopidogrel in the morning. Patient will take dicyclomine 10 mg every morning and second dose on days when she eats at a restaurant. Patient will call office if lansoprazole does not control her GERD symptoms. Office visit in 1 year.

## 2017-10-04 NOTE — Patient Instructions (Addendum)
Call if lansoprazole/Prevacid does not control heartburn. Do not take lansoprazole and clopidogrel together as discussed. Remember to take dicyclomine when you go out to eat.

## 2017-10-06 ENCOUNTER — Ambulatory Visit (INDEPENDENT_AMBULATORY_CARE_PROVIDER_SITE_OTHER): Payer: Self-pay | Admitting: Internal Medicine

## 2017-10-21 ENCOUNTER — Other Ambulatory Visit: Payer: Self-pay | Admitting: Cardiology

## 2017-10-25 ENCOUNTER — Ambulatory Visit (HOSPITAL_COMMUNITY)
Admission: RE | Admit: 2017-10-25 | Discharge: 2017-10-25 | Disposition: A | Discharge status: Home / Self Care | Payer: BLUE CROSS/BLUE SHIELD | Source: Ambulatory Visit | Attending: Family | Admitting: Family

## 2017-10-25 ENCOUNTER — Encounter: Payer: BLUE CROSS/BLUE SHIELD | Admitting: Family

## 2017-10-25 ENCOUNTER — Encounter: Payer: Self-pay | Admitting: Family

## 2017-10-25 DIAGNOSIS — F17200 Nicotine dependence, unspecified, uncomplicated: Secondary | ICD-10-CM

## 2017-10-25 DIAGNOSIS — I6523 Occlusion and stenosis of bilateral carotid arteries: Secondary | ICD-10-CM

## 2017-10-25 NOTE — Progress Notes (Signed)
Pt left before I could evaluate her.  She left message that she be called with results. She states her husband had an appointment in [place] at 4:30 that they had to get to. Pt denies any stroke or TIA symptoms since I last saw her.   Right ICA unchanged from 01-18-17 with 60-79% stenosis Right ECA with >50% stenosis Left ICA with 1-39% stenosis, an increase in stenosis compared to 01-18-17. Bilateral vertebral artery flow is antegrade.  Right subclavian artery waveforms are stenotic, with a PSV of 347 cm/s. Left subclavian artery waveforms are normal.   She continues to smoke 2 ppd, has added nicotine patch to try to quit; I advised that she needs to reduce cigarette use if she is adding another source of nicotine.  She states her DM is "normal", that her doctor told her this.   I advised pt to follow up in 6 months with carotid duplex and see me, and to call 911 if she experiences any stroke or TIA symptoms.  I counseled patient re smoking cessation.  I will request that one of our schedulers calls pt to scheduler her for an appointment for 6 months from now.

## 2017-10-25 NOTE — Patient Instructions (Signed)
Steps to Quit Smoking Smoking tobacco can be bad for your health. It can also affect almost every organ in your body. Smoking puts you and people around you at risk for many serious long-lasting (chronic) diseases. Quitting smoking is hard, but it is one of the best things that you can do for your health. It is never too late to quit. What are the benefits of quitting smoking? When you quit smoking, you lower your risk for getting serious diseases and conditions. They can include:  Lung cancer or lung disease.  Heart disease.  Stroke.  Heart attack.  Not being able to have children (infertility).  Weak bones (osteoporosis) and broken bones (fractures).  If you have coughing, wheezing, and shortness of breath, those symptoms may get better when you quit. You may also get sick less often. If you are pregnant, quitting smoking can help to lower your chances of having a baby of low birth weight. What can I do to help me quit smoking? Talk with your doctor about what can help you quit smoking. Some things you can do (strategies) include:  Quitting smoking totally, instead of slowly cutting back how much you smoke over a period of time.  Going to in-person counseling. You are more likely to quit if you go to many counseling sessions.  Using resources and support systems, such as: ? Online chats with a counselor. ? Phone quitlines. ? Printed self-help materials. ? Support groups or group counseling. ? Text messaging programs. ? Mobile phone apps or applications.  Taking medicines. Some of these medicines may have nicotine in them. If you are pregnant or breastfeeding, do not take any medicines to quit smoking unless your doctor says it is okay. Talk with your doctor about counseling or other things that can help you.  Talk with your doctor about using more than one strategy at the same time, such as taking medicines while you are also going to in-person counseling. This can help make  quitting easier. What things can I do to make it easier to quit? Quitting smoking might feel very hard at first, but there is a lot that you can do to make it easier. Take these steps:  Talk to your family and friends. Ask them to support and encourage you.  Call phone quitlines, reach out to support groups, or work with a counselor.  Ask people who smoke to not smoke around you.  Avoid places that make you want (trigger) to smoke, such as: ? Bars. ? Parties. ? Smoke-break areas at work.  Spend time with people who do not smoke.  Lower the stress in your life. Stress can make you want to smoke. Try these things to help your stress: ? Getting regular exercise. ? Deep-breathing exercises. ? Yoga. ? Meditating. ? Doing a body scan. To do this, close your eyes, focus on one area of your body at a time from head to toe, and notice which parts of your body are tense. Try to relax the muscles in those areas.  Download or buy apps on your mobile phone or tablet that can help you stick to your quit plan. There are many free apps, such as QuitGuide from the CDC (Centers for Disease Control and Prevention). You can find more support from smokefree.gov and other websites.  This information is not intended to replace advice given to you by your health care provider. Make sure you discuss any questions you have with your health care provider. Document Released: 12/26/2008 Document   Revised: 10/28/2015 Document Reviewed: 07/16/2014 Elsevier Interactive Patient Education  2018 Elsevier Inc.     Stroke Prevention Some health problems and behaviors may make it more likely for you to have a stroke. Below are ways to lessen your risk of having a stroke.  Be active for at least 30 minutes on most or all days.  Do not smoke. Try not to be around others who smoke.  Do not drink too much alcohol. ? Do not have more than 2 drinks a day if you are a man. ? Do not have more than 1 drink a day if you  are a woman and are not pregnant.  Eat healthy foods, such as fruits and vegetables. If you were put on a specific diet, follow the diet as told.  Keep your cholesterol levels under control through diet and medicines. Look for foods that are low in saturated fat, trans fat, cholesterol, and are high in fiber.  If you have diabetes, follow all diet plans and take your medicine as told.  Ask your doctor if you need treatment to lower your blood pressure. If you have high blood pressure (hypertension), follow all diet plans and take your medicine as told by your doctor.  If you are 18-39 years old, have your blood pressure checked every 3-5 years. If you are age 40 or older, have your blood pressure checked every year.  Keep a healthy weight. Eat foods that are low in calories, salt, saturated fat, trans fat, and cholesterol.  Do not take drugs.  Avoid birth control pills, if this applies. Talk to your doctor about the risks of taking birth control pills.  Talk to your doctor if you have sleep problems (sleep apnea).  Take all medicine as told by your doctor. ? You may be told to take aspirin or blood thinner medicine. Take this medicine as told by your doctor. ? Understand your medicine instructions.  Make sure any other conditions you have are being taken care of.  Get help right away if:  You suddenly lose feeling (you feel numb) or have weakness in your face, arm, or leg.  Your face or eyelid hangs down to one side.  You suddenly feel confused.  You have trouble talking (aphasia) or understanding what people are saying.  You suddenly have trouble seeing in one or both eyes.  You suddenly have trouble walking.  You are dizzy.  You lose your balance or your movements are clumsy (uncoordinated).  You suddenly have a very bad headache and you do not know the cause.  You have new chest pain.  Your heart feels like it is fluttering or skipping a beat (irregular  heartbeat). Do not wait to see if the symptoms above go away. Get help right away. Call your local emergency services (911 in [place] Do not drive yourself to the hospital. This information is not intended to replace advice given to you by your health care provider. Make sure you discuss any questions you have with your health care provider. Document Released: 08/31/2011 Document Revised: 08/07/2015 Document Reviewed: 09/01/2012 Elsevier Interactive Patient Education  2018 Elsevier Inc.  

## 2017-11-15 DIAGNOSIS — Z79891 Long term (current) use of opiate analgesic: Secondary | ICD-10-CM | POA: Diagnosis not present

## 2017-11-25 ENCOUNTER — Other Ambulatory Visit: Payer: Self-pay | Admitting: Cardiology

## 2018-01-23 ENCOUNTER — Encounter: Payer: Self-pay | Admitting: Cardiology

## 2018-01-23 ENCOUNTER — Ambulatory Visit (INDEPENDENT_AMBULATORY_CARE_PROVIDER_SITE_OTHER): Payer: BLUE CROSS/BLUE SHIELD | Admitting: Cardiology

## 2018-01-23 VITALS — BP 136/80 | HR 82 | Ht 66.0 in | Wt 166.0 lb

## 2018-01-23 DIAGNOSIS — I739 Peripheral vascular disease, unspecified: Secondary | ICD-10-CM

## 2018-01-23 DIAGNOSIS — E782 Mixed hyperlipidemia: Secondary | ICD-10-CM

## 2018-01-23 DIAGNOSIS — I2510 Atherosclerotic heart disease of native coronary artery without angina pectoris: Secondary | ICD-10-CM | POA: Diagnosis not present

## 2018-01-23 DIAGNOSIS — I10 Essential (primary) hypertension: Secondary | ICD-10-CM | POA: Diagnosis not present

## 2018-01-23 DIAGNOSIS — I6523 Occlusion and stenosis of bilateral carotid arteries: Secondary | ICD-10-CM | POA: Diagnosis not present

## 2018-01-23 MED ORDER — ROSUVASTATIN CALCIUM 20 MG PO TABS: 20.0000 mg | ORAL_TABLET | Freq: Every day | ORAL | 3 refills | Status: DC

## 2018-01-23 NOTE — Patient Instructions (Signed)
Medication Instructions:  DECREASE Crestor to 20 mg at dinner If you need a refill on your cardiac medications before your next appointment, please call your pharmacy.   Lab work: none If you have labs (blood work) drawn today and your tests are completely normal, you will receive your results only by: Marland Kitchen MyChart Message (if you have MyChart) OR . A paper copy in the mail If you have any lab test that is abnormal or we need to change your treatment, we will call you to review the results.  Testing/Procedures: none  Follow-Up: At [facility], you and your health needs are our priority.  As part of our continuing mission to provide you with exceptional heart care, we have created designated Provider Care Teams.  These Care Teams include your primary Cardiologist (physician) and Advanced Practice Providers (APPs -  Physician Assistants and Nurse Practitioners) who all work together to provide you with the care you need, when you need it. You will need a follow up appointment in 6 months.  Please call our office 2 months in advance to schedule this appointment.  You may see Carlyle Dolly, MD or one of the following Advanced Practice Providers on your designated Care Team:   Bernerd Pho, PA-C [facility]) . Ermalinda Barrios, PA-C ([facility])  Any Other Special Instructions Will Be Listed Below (If Applicable). None

## 2018-01-23 NOTE — Progress Notes (Signed)
Clinical Summary Ms. Lockamy is a 62 y.o.female today for follow up of the following medical problems.   1. CAD - 07/2016 nuclear stress test shows small moderate intensity apical/anteroseptal defect partially reversible, tid 1.67. Overall high risk study.  - cath 07/2016 with 60-70% mid LAD, 90% prox RCA. Received DES to RCA. Recs per interventional if continued chest pain could consider FFR guided PCI of mid LAD  - started about 2 weeks ago. Under bilateral breast, constant low grade pain. Could be better with antacid. Could last all day long. Worst with deep breath. Better with tylenol. No other associated symptoms.  - not exertional. Not related to eating.   2. PAD - pain in bilateral calves with walking, left greater than right. Pain with 1/2 block, worst with incline. Better with rest - ABIs report looks to be mislabled as upper extremity report. ABIs show mild to moderate disease. 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 Bilateral 2 vessel runoff. Could consider intervention if neccesary.  - had diarrhea on cilostazol. She does have a history of IBS and is followed by GI. Also had some dizziness on cilostazol, shaky, Disontionued and resolved.   - still with left calf pain at times. Overall not limiting. No sores on feet.   2. Carotid stenosis - history of prior left CEA - history of prior stroke.  - followed by vascular  - no recent symptoms.   3. DM2 - followed by pcp.  4. HTN - compliant with meds   5. Hyperlipidemia - muscle aches on atorvastatin. Crestor causes muscle aches. Tried on several others - crestor 40 caused muscle aches, she cut herself down to 20mg  daily and is tolerating.    6. Smoking - still smoking.  - bad dreams on chantix in the past     Past Medical History:  Diagnosis Date  . Anxiety   . Arthritis    "might  have a touch in my fingers" (08/02/2016)  . Cervicalgia   . Chest pain, unspecified   . GERD (gastroesophageal reflux disease)   . Headache    "bad ones after my stroke in 2016; only have them when I get bad news now" (08/02/2016)  . Heart murmur   . Heavy smoker (more than 20 cigarettes per day)    Scheduled for LDCT screening 02/11/15  . Hepatitis A    "related to [employer]"  . History of hiatal hernia   . History of kidney stones    "no cysto/OR" (12/10/2014)  . Hypercholesteremia   . Hypertension   . Obesity   . Reflux esophagitis   . Stroke [facility])    "they said I had a silent stroke a few months back/MRI" (12/10/2014)  . TIA (transient ischemic attack) 11/28/2014   [name] 11/28/2014  . Type II diabetes mellitus (HCC)      Allergies  Allergen Reactions  . Codeine Shortness Of Breath  . Latex Itching  . Sulfa Antibiotics Other (See Comments)    Reaction:  Unknown      Current Outpatient Medications  Medication Sig Dispense Refill  . acetaminophen (TYLENOL) 325 MG tablet Take 2 tablets (650 mg total) by mouth every 4 (four) hours as needed for headache or mild pain.    Marland Kitchen ALPRAZolam (XANAX) 1 MG tablet Take 0.5 mg by mouth 3 (three) times daily as needed.     Marland Kitchen  aspirin EC 81 MG tablet Take 1 tablet (81 mg total) by mouth daily. Please continue Aspirin and plavix for 6 months, then plavix daily 30 tablet 6  . clopidogrel (PLAVIX) 75 MG tablet Take 1 tablet (75 mg total) by mouth daily. 30 tablet 3  . dicyclomine (BENTYL) 10 MG capsule Take 1 capsule (10 mg total) by mouth 2 (two) times daily. 60 capsule 5  . gabapentin (NEURONTIN) 300 MG capsule Take 300 mg by mouth 2 (two) times daily.     Marland Kitchen glipiZIDE (GLUCOTROL) 5 MG tablet TK 1 T PO QD  5  . lansoprazole (PREVACID) 30 MG capsule Take 1 capsule (30 mg total) by mouth daily before supper. 30 capsule 11  . lisinopril (PRINIVIL,ZESTRIL) 5 MG tablet Take 1 tablet by mouth daily.  12  . metFORMIN (GLUCOPHAGE) 1000 MG tablet Take 1  tablet (1,000 mg total) by mouth 2 (two) times daily with a meal. 60 tablet 3  . metoprolol tartrate (LOPRESSOR) 25 MG tablet TAKE 1/2 TABLET BY MOUTH TWICE DAILY 30 tablet 3  . nicotine (NICODERM CQ) 21 mg/24hr patch USE 1- 21 MG PATCH DAILY FOR 6 WEEKS, THEN DECREASE TO 14 MG DAILY FOR 2 WEEKS, THEN DECREASE TO 7 MG DAILY FOR 2 WEEKS. (Patient not taking: Reported on 10/04/2017) 70 patch 0  . nitroGLYCERIN (NITROSTAT) 0.4 MG SL tablet Place 1 tablet (0.4 mg total) under the tongue every 5 (five) minutes as needed for chest pain. 25 tablet 2  . nitroGLYCERIN (NITROSTAT) 0.4 MG SL tablet Place 0.4 mg under the tongue every 5 (five) minutes as needed for chest pain.    Marland Kitchen nystatin cream (MYCOSTATIN) Apply 1 application topically 2 (two) times daily as needed.  5  . ondansetron (ZOFRAN) 4 MG tablet TAKE 1 TABLET(4 MG) BY MOUTH TWICE DAILY AS NEEDED FOR NAUSEA OR VOMITING 20 tablet 1  . oxyCODONE (OXY IR/ROXICODONE) 5 MG immediate release tablet Take 1 tablet by mouth every 4 (four) hours as needed for moderate pain.   0  . rosuvastatin (CRESTOR) 40 MG tablet Take 1 tablet (40 mg total) by mouth daily. 90 tablet 3  . rosuvastatin (CRESTOR) 40 MG tablet Take 20 mg by mouth daily.     No current facility-administered medications for this visit.      Past Surgical History:  Procedure Laterality Date  . ABDOMINAL AORTOGRAM N/A 08/02/2016   Procedure: Abdominal Aortogram;  Surgeon: Nelva Bush, MD;  Location: [facility];  Service: Cardiovascular;  Laterality: N/A;  . CHOLECYSTECTOMY OPEN  1978  . CORONARY ANGIOPLASTY WITH STENT PLACEMENT  08/02/2016   to RCA  . CORONARY STENT INTERVENTION N/A 08/02/2016   Procedure: Coronary Stent Intervention;  Surgeon: Nelva Bush, MD;  Location: [facility];  Service: Cardiovascular;  Laterality: N/A;  RCA  . ENDARTERECTOMY Left 12/02/2014   Procedure: ENDARTERECTOMY CAROTID;  Surgeon: Rosetta Posner, MD;  Location: [facility];  Service: Vascular;   Laterality: Left;  . LEFT HEART CATH AND CORONARY ANGIOGRAPHY N/A 08/02/2016   Procedure: Left Heart Cath and Coronary Angiography;  Surgeon: Nelva Bush, MD;  Location: [facility];  Service: Cardiovascular;  Laterality: N/A;  . LOWER EXTREMITY ANGIOGRAM  08/02/2016  . LOWER EXTREMITY ANGIOGRAPHY N/A 08/02/2016   Procedure: Lower Extremity Angiography;  Surgeon: Nelva Bush, MD;  Location: [facility];  Service: Cardiovascular;  Laterality: N/A;  . VAGINAL HYSTERECTOMY  1991   "partial"     Allergies  Allergen Reactions  .  Codeine Shortness Of Breath  . Latex Itching  . Sulfa Antibiotics Other (See Comments)    Reaction:  Unknown       Family History  Problem Relation Age of Onset  . Congestive Heart Failure Mother   . COPD Father   . Cancer Brother   . Cancer Brother      Social History Ms. Sherman reports that she has been smoking cigarettes. She started smoking about 43 years ago. She has a 86.00 pack-year smoking history. She has never used smokeless tobacco. Ms. Pandolfi reports that she does not drink alcohol.   Review of Systems CONSTITUTIONAL: No weight loss, fever, chills, weakness or fatigue.  HEENT: Eyes: No visual loss, blurred vision, double vision or yellow sclerae.No hearing loss, sneezing, congestion, runny nose or sore throat.  SKIN: No rash or itching.  CARDIOVASCULAR: per hpi RESPIRATORY: No shortness of breath, cough or sputum.  GASTROINTESTINAL: No anorexia, nausea, vomiting or diarrhea. No abdominal pain or blood.  GENITOURINARY: No burning on urination, no polyuria NEUROLOGICAL: No headache, dizziness, syncope, paralysis, ataxia, numbness or tingling in the extremities. No change in bowel or bladder control.  MUSCULOSKELETAL: No muscle, back pain, joint pain or stiffness.  LYMPHATICS: No enlarged nodes. No history of splenectomy.  PSYCHIATRIC: No history of depression or anxiety.  ENDOCRINOLOGIC: No reports of sweating, cold  or heat intolerance. No polyuria or polydipsia.  Marland Kitchen   Physical Examination Vitals:   01/23/18 1304  BP: 136/80  Pulse: 82  SpO2: 94%   Vitals:   01/23/18 1304  Weight: 166 lb (75.3 kg)  Height: 5\' 6"  (1.676 m)    Gen: resting comfortably, no acute distress HEENT: no scleral icterus, pupils equal round and reactive, no palptable cervical adenopathy,  CV: RRR, no m/r/g, no jvd Resp: Clear to auscultation bilaterally GI: abdomen is soft, non-tender, non-distended, normal bowel sounds, no hepatosplenomegaly MSK: extremities are warm, no edema.  Skin: warm, no rash Neuro:  no focal deficits Psych: appropriate affect   Diagnostic Studies 11/2014 echo Study Conclusions  - Left ventricle: The cavity size was normal. There was mild focal basal hypertrophy of the septum. Systolic function was normal. The estimated ejection fraction was in the range of 60% to 65%. Wall motion was normal; there were no regional wall motion abnormalities. Doppler parameters are consistent with abnormal left ventricular relaxation (grade 1 diastolic dysfunction). - Aortic valve: Trileaflet; mildly calcified leaflets. There was trivial regurgitation. - Mitral valve: Calcified annulus. - Right atrium: Central venous pressure (est): 3 mm Hg. - Atrial septum: There was increased thickness of the septum, consistent with lipomatous hypertrophy. No defect or patent foramen ovale was identified. - Tricuspid valve: There was trivial regurgitation. - Pulmonary arteries: Systolic pressure could not be accurately estimated. - Pericardium, extracardiac: A prominent pericardial fat pad was present.  Impressions:  - Mild basal septal LV hypertrophy with LVEF 60-65% and grade 1 diastolic dysfunction. Mildly calcified mitral annulus as well as mildly sclerotic aortic valve. Trivial aortic regurgitation. No obvious PFO or ASD.  07/2016 Nuclear stress  No diagnostic ST segment  changes to indicate ischemia with Lexiscan.  Small, moderate intensity, apical anteroseptal defect is partially reversible suggestive of a mild ischemic territory. Thr TID ratio is also significantly increased at 1.67 suggesting the possibility of subendocardial or balanced multivessel ischemia.  This is a high risk study based predominantly on the significantly elevated TID ratio.  Nuclear stress EF: 63%.   07/2016 cath  Prox LAD to Mid LAD lesion, 20 %  stenosed.  Conclusions: 1. Significant 2 vessel coronary artery disease, including 60-70% mid LAD and 90% proximal RCA stenoses. 2. Mildly elevated left ventricular filling pressure. 3. Aortic atherosclerosis with mild to moderate bilateral common iliac artery disease. 4. 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 5. Bilateral two-vessel runoff. 6. Successful PCI to the proximal RCA with placement of a Promus Premier 2.5 x 16 mm drug-eluting stent with 0% residual stenosis and TIMI-3 flow.  Recommendations: 1. Continue dual antiplatelet therapy with aspirin and clopidogrel for at least 6 months, ideally longer. 2. If patient has recurrent chest pain, consider FFR guided PCI (likely requiring orbital atherectomy) to the mid LAD. 3. Peripheral angiography reviewed with Dr. Gwenlyn Found; endovascular intervention to left SFA could be considered in the future, if claudication persists.    Assessment and Plan  1. CAD -- recent atypical symptoms as described above, not conistent with ischemic chest pain.  - Medically manage moderate LAD lesion. If recurrent symptoms consider FFR PCI of LAD.  - continue current therapy. Due to extensive CAD, PAD, and carotid disease have elected to continue DAPT.  - EKG SR, no ischemic changes   2. PAD - ongoing claudicaiton, has not been lifestyle limiting.  - did not tolerate cilostazol, stopped. Encouraged to continue walking program at home - follow symptoms, if  peristent may consider intervention on left SFA.   - chronic stable claudication, not limiting. No sores. Did not tolerate cilostazol - continue medical therapy..   3. Hyperlipidemia - tolerating crestor 20mg  daily, did not tolerate high dosing. Continue current therapy, request labs from pcp.    4. HTN - reasonable control, continue current meds   F/u 6 months      Arnoldo Lenis, M.D.

## 2018-01-27 ENCOUNTER — Other Ambulatory Visit (INDEPENDENT_AMBULATORY_CARE_PROVIDER_SITE_OTHER): Payer: Self-pay | Admitting: Internal Medicine

## 2018-02-02 ENCOUNTER — Other Ambulatory Visit (INDEPENDENT_AMBULATORY_CARE_PROVIDER_SITE_OTHER): Payer: Self-pay | Admitting: Internal Medicine

## 2018-03-01 ENCOUNTER — Telehealth: Payer: Self-pay | Admitting: Cardiology

## 2018-03-01 MED ORDER — NITROGLYCERIN 0.4 MG SL SUBL: 0.4000 mg | SUBLINGUAL_TABLET | SUBLINGUAL | 3 refills | Status: DC | PRN

## 2018-03-01 NOTE — Telephone Encounter (Signed)
Patient is requesting RX for NTG pills to be sent to [address] / tg

## 2018-03-16 ENCOUNTER — Other Ambulatory Visit (INDEPENDENT_AMBULATORY_CARE_PROVIDER_SITE_OTHER): Payer: Self-pay | Admitting: Internal Medicine

## 2018-03-22 ENCOUNTER — Other Ambulatory Visit: Payer: Self-pay | Admitting: Cardiology

## 2018-04-17 DIAGNOSIS — M545 Low back pain: Secondary | ICD-10-CM | POA: Diagnosis not present

## 2018-04-17 DIAGNOSIS — E1165 Type 2 diabetes mellitus with hyperglycemia: Secondary | ICD-10-CM | POA: Diagnosis not present

## 2018-04-17 DIAGNOSIS — N390 Urinary tract infection, site not specified: Secondary | ICD-10-CM | POA: Diagnosis not present

## 2018-04-17 DIAGNOSIS — I10 Essential (primary) hypertension: Secondary | ICD-10-CM | POA: Diagnosis not present

## 2018-04-21 ENCOUNTER — Other Ambulatory Visit: Payer: Self-pay

## 2018-04-21 DIAGNOSIS — I6523 Occlusion and stenosis of bilateral carotid arteries: Secondary | ICD-10-CM

## 2018-04-21 DIAGNOSIS — I6529 Occlusion and stenosis of unspecified carotid artery: Secondary | ICD-10-CM

## 2018-04-21 DIAGNOSIS — F17200 Nicotine dependence, unspecified, uncomplicated: Secondary | ICD-10-CM

## 2018-04-22 ENCOUNTER — Other Ambulatory Visit (INDEPENDENT_AMBULATORY_CARE_PROVIDER_SITE_OTHER): Payer: Self-pay | Admitting: Internal Medicine

## 2018-04-28 ENCOUNTER — Encounter (HOSPITAL_COMMUNITY): Payer: Self-pay

## 2018-04-28 ENCOUNTER — Ambulatory Visit: Payer: Self-pay | Admitting: Family

## 2018-05-22 ENCOUNTER — Other Ambulatory Visit (INDEPENDENT_AMBULATORY_CARE_PROVIDER_SITE_OTHER): Payer: Self-pay | Admitting: Internal Medicine

## 2018-06-14 ENCOUNTER — Other Ambulatory Visit (INDEPENDENT_AMBULATORY_CARE_PROVIDER_SITE_OTHER): Payer: Self-pay | Admitting: Internal Medicine

## 2018-06-28 ENCOUNTER — Other Ambulatory Visit (HOSPITAL_COMMUNITY): Payer: Self-pay | Admitting: Pulmonary Disease

## 2018-06-28 ENCOUNTER — Other Ambulatory Visit: Payer: Self-pay

## 2018-06-28 ENCOUNTER — Ambulatory Visit (HOSPITAL_COMMUNITY)
Admission: RE | Admit: 2018-06-28 | Discharge: 2018-06-28 | Disposition: A | Discharge status: Home / Self Care | Payer: BLUE CROSS/BLUE SHIELD | Source: Ambulatory Visit | Attending: Pulmonary Disease | Admitting: Pulmonary Disease

## 2018-06-28 DIAGNOSIS — R05 Cough: Secondary | ICD-10-CM | POA: Insufficient documentation

## 2018-06-28 DIAGNOSIS — R059 Cough, unspecified: Secondary | ICD-10-CM

## 2018-06-28 DIAGNOSIS — R0602 Shortness of breath: Secondary | ICD-10-CM | POA: Diagnosis not present

## 2018-07-14 ENCOUNTER — Other Ambulatory Visit (INDEPENDENT_AMBULATORY_CARE_PROVIDER_SITE_OTHER): Payer: Self-pay | Admitting: Internal Medicine

## 2018-07-17 DIAGNOSIS — M5136 Other intervertebral disc degeneration, lumbar region: Secondary | ICD-10-CM | POA: Diagnosis not present

## 2018-07-17 DIAGNOSIS — J449 Chronic obstructive pulmonary disease, unspecified: Secondary | ICD-10-CM | POA: Diagnosis not present

## 2018-07-17 DIAGNOSIS — I2510 Atherosclerotic heart disease of native coronary artery without angina pectoris: Secondary | ICD-10-CM | POA: Diagnosis not present

## 2018-07-17 DIAGNOSIS — E1165 Type 2 diabetes mellitus with hyperglycemia: Secondary | ICD-10-CM | POA: Diagnosis not present

## 2018-10-09 ENCOUNTER — Other Ambulatory Visit (INDEPENDENT_AMBULATORY_CARE_PROVIDER_SITE_OTHER): Payer: Self-pay | Admitting: Internal Medicine

## 2018-10-10 ENCOUNTER — Ambulatory Visit (INDEPENDENT_AMBULATORY_CARE_PROVIDER_SITE_OTHER): Payer: Self-pay | Admitting: Internal Medicine

## 2018-12-12 ENCOUNTER — Other Ambulatory Visit: Payer: Self-pay

## 2018-12-12 MED ORDER — METOPROLOL TARTRATE 25 MG PO TABS: 12.5000 mg | ORAL_TABLET | Freq: Two times a day (BID) | ORAL | 3 refills | Status: DC

## 2018-12-12 NOTE — Telephone Encounter (Signed)
Refilled lopressor

## 2018-12-20 ENCOUNTER — Other Ambulatory Visit (INDEPENDENT_AMBULATORY_CARE_PROVIDER_SITE_OTHER): Payer: Self-pay | Admitting: Internal Medicine

## 2018-12-21 ENCOUNTER — Other Ambulatory Visit: Payer: Self-pay

## 2018-12-21 ENCOUNTER — Ambulatory Visit (INDEPENDENT_AMBULATORY_CARE_PROVIDER_SITE_OTHER): Payer: BC Managed Care – PPO | Admitting: Family Medicine

## 2018-12-21 ENCOUNTER — Encounter: Payer: Self-pay | Admitting: Family Medicine

## 2018-12-21 VITALS — BP 174/82 | HR 76 | Temp 98.4°F | Ht 66.0 in | Wt 172.4 lb

## 2018-12-21 DIAGNOSIS — F17200 Nicotine dependence, unspecified, uncomplicated: Secondary | ICD-10-CM

## 2018-12-21 DIAGNOSIS — E782 Mixed hyperlipidemia: Secondary | ICD-10-CM | POA: Insufficient documentation

## 2018-12-21 DIAGNOSIS — M545 Low back pain, unspecified: Secondary | ICD-10-CM | POA: Insufficient documentation

## 2018-12-21 DIAGNOSIS — I739 Peripheral vascular disease, unspecified: Secondary | ICD-10-CM

## 2018-12-21 DIAGNOSIS — F419 Anxiety disorder, unspecified: Secondary | ICD-10-CM

## 2018-12-21 DIAGNOSIS — E119 Type 2 diabetes mellitus without complications: Secondary | ICD-10-CM | POA: Diagnosis not present

## 2018-12-21 DIAGNOSIS — E785 Hyperlipidemia, unspecified: Secondary | ICD-10-CM | POA: Diagnosis not present

## 2018-12-21 DIAGNOSIS — K219 Gastro-esophageal reflux disease without esophagitis: Secondary | ICD-10-CM

## 2018-12-21 DIAGNOSIS — I2510 Atherosclerotic heart disease of native coronary artery without angina pectoris: Secondary | ICD-10-CM

## 2018-12-21 DIAGNOSIS — G459 Transient cerebral ischemic attack, unspecified: Secondary | ICD-10-CM

## 2018-12-21 DIAGNOSIS — I10 Essential (primary) hypertension: Secondary | ICD-10-CM

## 2018-12-21 DIAGNOSIS — E1122 Type 2 diabetes mellitus with diabetic chronic kidney disease: Secondary | ICD-10-CM | POA: Insufficient documentation

## 2018-12-21 DIAGNOSIS — Z9861 Coronary angioplasty status: Secondary | ICD-10-CM

## 2018-12-21 LAB — POCT GLYCOSYLATED HEMOGLOBIN (HGB A1C): Hemoglobin A1C: 8.2 % — AB (ref 4.0–5.6)

## 2018-12-21 NOTE — Patient Instructions (Addendum)
Check blood pressure-goal 130/80  Check glucose fasting in the morning  Call Dr. Harl Bowie for follow up appointment -blood pressure elevated   Endocrinology appt with Dr. Dorris Fetch to discuss options for additional treatment  Pt to consider psychiatry referral for xanax and Pain Management referral for oxycodone. Referrals made  labwork fasting

## 2018-12-21 NOTE — Progress Notes (Signed)
New Patient Office Visit  Subjective:  Patient ID: Grace Hess, female    DOB: 04-06-1955  Age: 63 y.o. MRN: [id number]  CC:  Chief Complaint  Patient presents with  . Establish Care    HPI Grace Hess presents for  CAD - 07/2016 nuclear stress test shows small moderate intensity apical/anteroseptal defect partially reversible, tid 1.67. Overall high risk study.  - cath 07/2016 with 60-70% mid LAD, 90% prox RCA. Received DES to RCA. Recs per interventional if continued chest pain could consider FFR guided PCI of mid LAD   stent placed 5/18-no follow up this year Pt with blockage in the leg-  PAD-vascular surgery - pain in bilateral calves with walking, left greater than right. Pain with 1/2 block, worst with incline. Better with rest - ABIs report looks to be mislabled as upper extremity report. ABIs show mild to moderate disease. 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 Bilateral 2 vessel runoff. Could consider intervention if neccesary.  - had diarrhea on cilostazol. She does have a history of IBS and is followed   GERD-prilosec-greater than 5 years, hiatal hernia  DM-no insulin-fasting glucose below 150-no blood work this year  No recent mammogram or pap smear due to insurance-hysterectomy-bleeding  No DEXA-+FH 12/14/2018  1   07/13/2018  Alprazolam 1 MG Tablet  90.00  30 Ed Haw   [id number]   Wal (0327)   3  6.00 LME  Comm Ins   [place]  11/15/2018  1   11/15/2018  Oxycodone Hcl 5 MG Tablet  180.00  30 Ed Haw   [id number]   Wal (0327)   0  45.00 MME  Comm Ins   [place]  10/09/2018  1   07/13/2018  Alprazolam 1 MG Tablet  90.00  30 Ed Haw   [id number]   Wal (0327)   2  6.00 LME  Comm Ins   [place]  10/05/2018  1   10/05/2018  Oxycodone Hcl 5 MG Tablet  180.00  30 Ed Haw   [id number]   Wal (0327)   0  45.00 MME  Comm Ins   [place]  09/10/2018  1   07/13/2018  Alprazolam 1 MG Tablet  90.00  30  Ed Haw   [id number]   Wal (0327)   1  6.00 LME  Comm Ins   [place]  08/24/2018  1   08/24/2018  Oxycodone Hcl 5 MG Tablet  180.00  30 Ed Haw   [id number]   Wal (0327)   0  45.00 MME  Comm Ins   [place]  07/17/2018  1   07/17/2018  Oxycodone Hcl 5 MG Tablet  180.00  30 Ed Haw   [id number]   Wal (0327)   0  45.00 MME  Comm Ins   [place]  07/13/2018  1   07/13/2018  Alprazolam 1 MG Tablet  90.00  30 Ed Haw   [id number]   Wal (0327)   0  6.00 LME  Comm Ins   [place]  06/09/2018  1   06/09/2018  Oxycodone Hcl 5 MG Tablet  180.00  30 Ed Haw   [id number]   Wal (0327)   0  45.00 MME  Comm Ins     05/22/2018  1   12/29/2017  Alprazolam 1 MG Tablet  90.00  30 Ed Haw   [id number]   Wal [phone])  3  6.00 LME  Comm Ins   [place]  05/12/2018  1   02/15/2018  Oxycodone Hcl 5 MG Tablet  180.00  30 Ed Haw   [id number]   Wal (0327)   0  45.00 MME  Comm Ins   [place]  04/16/2018  1   12/29/2017  Alprazolam 1 MG Tablet  90.00  30 Ed Haw   [id number]   Wal (0327)   2  6.00 LME  Comm Ins   [place]  03/31/2018  1   02/15/2018  Oxycodone Hcl 5 MG Tablet  180.00  30 Ed Haw   [id number]   Wal (0327)   0  45.00 MME  Comm Ins   [place]  03/01/2018  1   12/29/2017  Alprazolam 1 MG Tablet  90.00  30 Ed Haw   [id number]   Wal (0327)   1  6.00 LME  Comm Ins   [place]  02/21/2018  1   02/20/2018  Oxycodone Hcl 5 MG Tablet  180.00  30 Ed Haw   [id number]   Wal (0327)   0  45.00 MME  Comm Ins   [place]  01/18/2018  1   01/18/2018  Oxycodone Hcl 5 MG Tablet  180.00  30 Ed Haw   [id number]   Wal (0327)   0  45.00 MME  Comm Ins   [place]  12/29/2017  1   12/29/2017  Alprazolam 1 MG Tablet  90.00  30 Ed Haw   [id number]   Wal (0327)   0  6.00 LME  Comm Ins   [place]  12/17/2017  1   11/11/2017  Oxycodone Hcl 5 MG Tablet  180.00  30 Ed Haw   [id number]   Wal (0327)   0  45.00 MME  Comm Ins   [place]  11/15/2017  1   05/30/2017  Alprazolam 1 MG Tablet  90.00  30 Ed Haw   [id number]   Wal (0327)   1  6.00 LME  Comm Ins   [place]  11/15/2017  1   11/11/2017  Oxycodone Hcl 5 MG Tablet  180.00  30 Ed Haw   [id number]   Wal (0327)   0  45.00 MME  Comm Ins   [place]  10/14/2017  1    10/14/2017  Oxycodone Hcl 5 MG Tablet  180.00  30 Ed Haw   [id number]   Wal (0327)   0  45.00 MME  Comm Ins   [place]  09/12/2017  1   08/10/2017  Oxycodone Hcl 5 MG Tablet  180.00  30 Ed Haw   [id number]   Wal (0327)   0  45.00 MME  Comm Ins   [place]  09/06/2017  1   05/30/2017  Alprazolam 1 MG Tablet  90.00  30 Ed Haw   [id number]   Wal (0327)   0  6.00 LME  Comm Ins   [place]  08/12/2017  1   08/10/2017  Oxycodone Hcl 5 MG Tablet  180.00  30 Ed Haw   [id number]   Wal (0327)   0  45.00 MME  Comm Ins   [place]  07/27/2017  1   05/30/2017  Alprazolam 1 MG Tablet  90.00  30 Ed Haw   [id number]   Wal (0019)   1  6.00 LME  Comm Ins   [place]  07/08/2017  1   07/06/2017  Oxycodone Hcl 5 MG Tablet  180.00  30 Ed Haw   [id number]   Wal (  0019)   0  45.00 MME  Comm Ins   [place]  05/30/2017  1   05/25/2017  Oxycodone Hcl 5 MG Tablet  180.00  30 Ed Haw   [id number]   Wal (0019)   0  45.00 MME  Comm Ins   [place]  05/30/2017  1   05/30/2017  Alprazolam 1 MG Tablet  90.00  30 Ed Haw   [id number]   Wal (0019)   0  6.00 LME  Comm Ins   [place]  04/19/2017  1   11/01/2016  Alprazolam 1 MG Tablet  90.00  30 Ed Haw   [id number]   Wal (0019)   3  6.00 LME  Comm Ins   [place]  04/03/2017  1   01/31/2017  Oxycodone Hcl 5 MG Tablet  180.00  30 Ed Haw   [id number]   Wal (0019)   0  45.00 MME  Comm Ins   [place]  03/03/2017  1   01/31/2017  Oxycodone Hcl 5 MG Tablet  180.00  30 Ed Haw   [id number]   Wal (0019)   0  45.00 MME  Comm Ins   [place]  03/02/2017  1   11/01/2016  Alprazolam 1 MG Tablet  90.00  30 Ed Haw   [id number]   Wal (0019)   2  6.00 LME  Comm Ins   [place]  02/01/2017  1   01/31/2017  Oxycodone Hcl 5 MG Tablet  180.00  30 Ed Haw   [id number]   Wal (0019)   0  45.00 MME  Comm Ins   [place]  12/31/2016  1   10/22/2016  Oxycodone Hcl 5 MG Tablet  180.00  30 Ed Haw   [id number]   Wal (0019)  [place] [phone] 0  45.00 MME  Comm Ins     12/22/2016  1   11/01/2016  Alprazolam 1 MG Tablet  90.00  30 Ed Nils Pyle   [id number]   Wal         Past Medical History:  Diagnosis Date  . Anxiety   . Arthritis     "might have a touch in my fingers" (08/02/2016)  . Cervicalgia   . Chest pain, unspecified   . GERD (gastroesophageal reflux disease)   . Headache    "bad ones after my stroke in 2016; only have them when I get bad news now" (08/02/2016)  . Heart murmur   . Heavy smoker (more than 20 cigarettes per day)    Scheduled for LDCT screening 02/11/15  . Hepatitis A    "related to [employer]"  . History of hiatal hernia   . History of kidney stones    "no cysto/OR" (12/10/2014)  . Hypercholesteremia   . Hypertension   . Obesity   . Reflux esophagitis   . Stroke [facility])    "they said I had a silent stroke a few months back/MRI" (12/10/2014)  . TIA (transient ischemic attack) 11/28/2014   [name] 11/28/2014  . Type II diabetes mellitus ([place])     Past Surgical History:  Procedure Laterality Date  . ABDOMINAL AORTOGRAM N/A 08/02/2016   Procedure: Abdominal Aortogram;  Surgeon: Nelva Bush, MD;  Location: [place] [facility];  Service: Cardiovascular;  Laterality: N/A;  . CAROTID STENT    . CHOLECYSTECTOMY OPEN  1978  . CORONARY ANGIOPLASTY WITH STENT PLACEMENT  08/02/2016   to RCA  . CORONARY STENT INTERVENTION N/A 08/02/2016   Procedure:  Coronary Stent Intervention;  Surgeon: Nelva Bush, MD;  Location: [place] [facility];  Service: Cardiovascular;  Laterality: N/A;  RCA  . ENDARTERECTOMY Left 12/02/2014   Procedure: ENDARTERECTOMY CAROTID;  Surgeon: Rosetta Posner, MD;  Location: [place];  Service: Vascular;  Laterality: Left;  . LEFT HEART CATH AND CORONARY ANGIOGRAPHY N/A 08/02/2016   Procedure: Left Heart Cath and Coronary Angiography;  Surgeon: Nelva Bush, MD;  Location: [place] [facility];  Service: Cardiovascular;  Laterality: N/A;  . LOWER EXTREMITY ANGIOGRAM  08/02/2016  . LOWER EXTREMITY ANGIOGRAPHY N/A 08/02/2016   Procedure: Lower Extremity Angiography;  Surgeon: Nelva Bush, MD;  Location: [facility];  Service: Cardiovascular;  Laterality: N/A;  . VAGINAL  HYSTERECTOMY  1991   "partial"    Family History  Problem Relation Age of Onset  . Congestive Heart Failure Mother   . COPD Father   . Cancer Brother   . Cancer Brother     Social History  Live with husband, custody of 5 yo(now lives with son), mother in law-has dementia Socioeconomic History  . Marital status: Married    Spouse name: Not on file  . Number of children: 2  . Years of education: 68  . Highest education level: Not on file  Occupational History  . Not on file  Social Needs  . Financial resource strain: Not on file  . Food insecurity    Worry: Not on file    Inability: Not on file  . Transportation needs    Medical: Not on file    Non-medical: Not on file  Tobacco Use  . Smoking status: Current Every Day Smoker    Packs/day: 2.00    Years: 43.00    Pack years: 86.00    Types: Cigarettes    Start date: 05/17/1974  . Smokeless tobacco: Never Used  Substance and Sexual Activity  . Alcohol use: No    Alcohol/week: 0.0 standard drinks  . Drug use: No  . Sexual activity: Never  Lifestyle  . Physical activity    Days per week: Not on file    Minutes per session: Not on file  . Stress: Not on file  Relationships  . Social Herbalist on phone: Not on file    Gets together: Not on file    Attends religious service: Not on file    Active member of club or organization: Not on file    Attends meetings of clubs or organizations: Not on file    Relationship status: Not on file  . Intimate partner violence    Fear of current or ex partner: Not on file    Emotionally abused: Not on file    Physically abused: Not on file    Forced sexual activity: Not on file  Other Topics Concern  . Not on file  Social History Narrative   Patient does not drink caffeine.   Patient is right handed.     ROS Review of Systems  HENT: Positive for drooling, hearing loss and tinnitus.        Hearing loss  Eyes: Positive for visual disturbance.        Glasses-reading /distance  Respiratory: Positive for cough.   Cardiovascular: Positive for chest pain.       Pressure sensation-nitro 1x every 3-4 months  Gastrointestinal:       GERD  Endocrine: Positive for heat intolerance.  Genitourinary: Positive for dyspareunia.  Musculoskeletal: Positive for back pain and joint  swelling.       Wrists swelling  Skin: Negative.   Allergic/Immunologic: Positive for environmental allergies.  Neurological: Negative.   Hematological: Bruises/bleeds easily.  Psychiatric/Behavioral: Positive for dysphoric mood.    Objective:   Today's Vitals: BP (!) 174/82 (BP Location: Left Arm, Patient Position: Sitting, Cuff Size: Normal)   Pulse 76   Temp 98.4 F (36.9 C) (Oral)   Ht 5\' 6"  (1.676 m)   Wt 172 lb 6.4 oz (78.2 kg)   SpO2 92%   BMI 27.83 kg/m   Physical Exam Constitutional:      Appearance: Normal appearance.  HENT:     Head: Normocephalic and atraumatic.     Right Ear: Tympanic membrane normal.     Left Ear: Tympanic membrane normal.     Nose: Nose normal.     Mouth/Throat:     Mouth: Mucous membranes are dry.  Eyes:     Conjunctiva/sclera: Conjunctivae normal.  Neck:     Musculoskeletal: Normal range of motion and neck supple.  Cardiovascular:     Rate and Rhythm: Normal rate and regular rhythm.     Pulses: Normal pulses.     Heart sounds: Normal heart sounds.  Pulmonary:     Effort: Pulmonary effort is normal.     Breath sounds: Normal breath sounds.  Musculoskeletal: Normal range of motion.  Neurological:     Mental Status: She is oriented to person, place, and time.  Psychiatric:        Mood and Affect: Mood normal.        Behavior: Behavior normal.     Assessment & Plan:    Outpatient Encounter Medications as of 12/21/2018  Medication Sig  . acetaminophen (TYLENOL) 325 MG tablet Take 2 tablets (650 mg total) by mouth every 4 (four) hours as needed for headache or mild pain.  Marland Kitchen ALPRAZolam (XANAX) 1 MG tablet Take  0.5 mg by mouth 3 (three) times daily as needed.   Marland Kitchen aspirin EC 81 MG tablet Take 1 tablet (81 mg total) by mouth daily. Please continue Aspirin and plavix for 6 months, then plavix daily  . clopidogrel (PLAVIX) 75 MG tablet Take 1 tablet (75 mg total) by mouth daily.  Marland Kitchen dicyclomine (BENTYL) 10 MG capsule Take 1 capsule (10 mg total) by mouth 2 (two) times daily.  Marland Kitchen gabapentin (NEURONTIN) 300 MG capsule Take 300 mg by mouth 2 (two) times daily.   Marland Kitchen glipiZIDE (GLUCOTROL) 5 MG tablet TK 1 T PO QD  . lansoprazole (PREVACID) 30 MG capsule TAKE 1 CAPSULE(30 MG) BY MOUTH DAILY BEFORE SUPPER  . lisinopril (PRINIVIL,ZESTRIL) 5 MG tablet Take 1 tablet by mouth daily.  . metFORMIN (GLUCOPHAGE) 1000 MG tablet Take 1 tablet (1,000 mg total) by mouth 2 (two) times daily with a meal.  . metoprolol tartrate (LOPRESSOR) 25 MG tablet Take 0.5 tablets (12.5 mg total) by mouth 2 (two) times daily.  . nicotine (NICODERM CQ) 21 mg/24hr patch USE 1- 21 MG PATCH DAILY FOR 6 WEEKS, THEN DECREASE TO 14 MG DAILY FOR 2 WEEKS, THEN DECREASE TO 7 MG DAILY FOR 2 WEEKS.  . nitroGLYCERIN (NITROSTAT) 0.4 MG SL tablet Place 1 tablet (0.4 mg total) under the tongue every 5 (five) minutes as needed for chest pain.  . nitroGLYCERIN (NITROSTAT) 0.4 MG SL tablet Place 1 tablet (0.4 mg total) under the tongue every 5 (five) minutes as needed for chest pain.  Marland Kitchen nystatin cream (MYCOSTATIN) Apply 1 application topically 2 (two) times daily as  needed.  . ondansetron (ZOFRAN) 4 MG tablet TAKE 1 TABLET(4 MG) BY MOUTH TWICE DAILY AS NEEDED FOR NAUSEA OR VOMITING  . oxyCODONE (OXY IR/ROXICODONE) 5 MG immediate release tablet Take 1 tablet by mouth every 4 (four) hours as needed for moderate pain.   . rosuvastatin (CRESTOR) 20 MG tablet Take 1 tablet (20 mg total) by mouth daily.   No facility-administered encounter medications on file as of 12/21/2018.    1. Type 2 diabetes mellitus without complication, unspecified whether long term insulin  use (HCC) Metformin 1000mg  BID, glipizide 5mg  daily-no prior use of insulin - POCT HgB A1C-8.2% - Ambulatory referral to Endocrinology Taking metformin and glipizide 2. Lumbar back pain Pt using narcotics-discussed with pt will not be able to rx medication-oxy that she takes prn for pain-pain management referral  3. Hyperlipidemia, unspecified hyperlipidemia type crestor-needs fasting labwork  4. CAD S/P percutaneous coronary angioplasty Call for cardio appt-none this year  5. Claudication [facility]) Call vascular -discuss about surgery for LE blockage with symptoms of claudication  6. TIA (transient ischemic attack) plavix and asa  7. Essential hypertension Metoprolol, lisinopril  11/19 ecg 8. Gastroesophageal reflux disease without esophagitis IBS-Bentyl, Prevacid  9. Tobacco use disorder Encouraged to quit  Anxiety-pt using Xanax 1.0mg -states she often uses 0.5mg  and not multiple times a day. Pt states primarily used at night. Pt with increase stress in her life-custody of 54 yo grandchild  Rodney Langton, CMA

## 2018-12-22 ENCOUNTER — Telehealth: Payer: Self-pay | Admitting: Cardiology

## 2018-12-22 NOTE — Telephone Encounter (Signed)
Patient is going to keep a BP log to share at visit in 1 week. She is also going to watch her sodium intake. She will call Dr.Corum , who she say today , if her BP elevates. She will continue to take all her medications. She states her highest diastolic BP is 383.

## 2018-12-22 NOTE — Telephone Encounter (Signed)
Pt is having problems with her BP going up, has made an apt to see Dr. Harl Bowie on 10/19 per PCP.    Pt is worried that it's going up to high and is wanting something to be done with her medications now.   Please call [phone]

## 2019-01-01 ENCOUNTER — Ambulatory Visit (INDEPENDENT_AMBULATORY_CARE_PROVIDER_SITE_OTHER): Payer: BC Managed Care – PPO | Admitting: Cardiology

## 2019-01-01 ENCOUNTER — Encounter: Payer: Self-pay | Admitting: Cardiology

## 2019-01-01 ENCOUNTER — Other Ambulatory Visit: Payer: Self-pay

## 2019-01-01 VITALS — BP 128/62 | HR 96 | Temp 97.6°F | Ht 66.0 in | Wt 169.0 lb

## 2019-01-01 DIAGNOSIS — I739 Peripheral vascular disease, unspecified: Secondary | ICD-10-CM | POA: Diagnosis not present

## 2019-01-01 DIAGNOSIS — I2510 Atherosclerotic heart disease of native coronary artery without angina pectoris: Secondary | ICD-10-CM

## 2019-01-01 DIAGNOSIS — R011 Cardiac murmur, unspecified: Secondary | ICD-10-CM

## 2019-01-01 DIAGNOSIS — E782 Mixed hyperlipidemia: Secondary | ICD-10-CM | POA: Diagnosis not present

## 2019-01-01 DIAGNOSIS — I10 Essential (primary) hypertension: Secondary | ICD-10-CM

## 2019-01-01 MED ORDER — LISINOPRIL 10 MG PO TABS: 10.0000 mg | ORAL_TABLET | Freq: Every day | ORAL | 3 refills | Status: DC

## 2019-01-01 NOTE — Patient Instructions (Addendum)
Medication Instructions:  INCREASE Lisinopril to 10 mg daily *If you need a refill on your cardiac medications before your next appointment, please call your pharmacy*  Lab Work: None today If you have labs (blood work) drawn today and your tests are completely normal, you will receive your results only by: Marland Kitchen MyChart Message (if you have MyChart) OR . A paper copy in the mail If you have any lab test that is abnormal or we need to change your treatment, we will call you to review the results.  Testing/Procedures: Your physician has requested that you have an echocardiogram. Echocardiography is a painless test that uses sound waves to create images of your heart. It provides your doctor with information about the size and shape of your heart and how well your heart's chambers and valves are working. This procedure takes approximately one hour. There are no restrictions for this procedure.    Follow-Up: At [facility], you and your health needs are our priority.  As part of our continuing mission to provide you with exceptional heart care, we have created designated Provider Care Teams.  These Care Teams include your primary Cardiologist (physician) and Advanced Practice Providers (APPs -  Physician Assistants and Nurse Practitioners) who all work together to provide you with the care you need, when you need it.  Your next appointment:   6 months  The format for your next appointment:   In Person  Provider:   Carlyle Dolly, MD  Other Instructions Record blood pressure daily for the next 2 weeks, you may drop off record

## 2019-01-01 NOTE — Progress Notes (Signed)
Clinical Summary Ms. Hamler is a 63 y.o.female today for follow up of the following medical problems.  1.CAD - 07/2016 nuclear stress test shows small moderate intensity apical/anteroseptal defect partially reversible, tid 1.67. Overall high risk study.  - cath 07/2016 with 60-70% mid LAD, 90% prox RCA. Received DES to RCA. Recs per interventional if continued chest pain could consider FFR guided PCI of mid LAD    - occasoinal chest pressure. Occurs about 1 month, typically occurs in the evening. No consistent exertional symptoms.   2. PAD - pain in bilateral calves with walking, left greater than right. Pain with 1/2 block, worst with incline. Better with rest - ABIs report looks to be mislabled as upper extremity report. ABIs show mild to moderate disease. 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 Bilateral 2 vessel runoff. Could consider intervention if neccesary.  - had diarrhea on cilostazol. She does have a history of IBS and is followed by GI.Also had some dizziness on cilostazol, shaky, Disontionued and resolved.    3. Carotid stenosis - history of prior left CEA - history of prior stroke.  - followed by vascular   4. DM2 - followed by pcp.  5. HTN - compliant with meds. Home bp's up and down, SBP at times up 190s. At pcp visit SBP 174 -  6. Hyperlipidemia - muscle aches on atorvastatin. Crestor causes muscle aches. Tried on several others - crestor 40 caused muscle aches, she cut herself down to 20mg  daily and is tolerating.   - upcoming labs with pcp       Past Medical History:  Diagnosis Date   Anxiety    Arthritis    "might have a touch in my fingers" (08/02/2016)   Cervicalgia    Chest pain, unspecified    GERD (gastroesophageal reflux disease)    Headache    "bad ones after my stroke in 2016; only have them when I get  bad news now" (08/02/2016)   Heart murmur    Heavy smoker (more than 20 cigarettes per day)    Scheduled for LDCT screening 02/11/15   Hepatitis A    "related to Janine Limbo"   History of hiatal hernia    History of kidney stones    "no cysto/OR" (12/10/2014)   Hypercholesteremia    Hypertension    Obesity    Reflux esophagitis    Stroke [facility])    "they said I had a silent stroke a few months back/MRI" (12/10/2014)   TIA (transient ischemic attack) 11/28/2014   [name] 11/28/2014   Type II diabetes mellitus (HCC)      Allergies  Allergen Reactions   Codeine Shortness Of Breath   Latex Itching   Sulfa Antibiotics Other (See Comments)    Reaction:  Unknown      Current Outpatient Medications  Medication Sig Dispense Refill   acetaminophen (TYLENOL) 325 MG tablet Take 2 tablets (650 mg total) by mouth every 4 (four) hours as needed for headache or mild pain.     ALPRAZolam (XANAX) 1 MG tablet Take 0.5 mg by mouth 3 (three) times daily as needed.      aspirin EC 81 MG tablet Take 1 tablet (81 mg total) by mouth daily. Please continue Aspirin and plavix for 6 months, then plavix daily 30 tablet 6   clopidogrel (PLAVIX) 75 MG tablet Take 1 tablet (75 mg  total) by mouth daily. 30 tablet 3   dicyclomine (BENTYL) 10 MG capsule Take 1 capsule (10 mg total) by mouth 2 (two) times daily. 60 capsule 5   gabapentin (NEURONTIN) 300 MG capsule Take 300 mg by mouth 2 (two) times daily.      glipiZIDE (GLUCOTROL) 5 MG tablet TK 1 T PO QD  5   lansoprazole (PREVACID) 30 MG capsule TAKE 1 CAPSULE(30 MG) BY MOUTH DAILY BEFORE SUPPER 30 capsule 5   lisinopril (PRINIVIL,ZESTRIL) 5 MG tablet Take 1 tablet by mouth daily.  12   metFORMIN (GLUCOPHAGE) 1000 MG tablet Take 1 tablet (1,000 mg total) by mouth 2 (two) times daily with a meal. 60 tablet 3   metoprolol tartrate (LOPRESSOR) 25 MG tablet Take 0.5 tablets (12.5 mg total) by mouth 2 (two) times daily. 90 tablet 3   nicotine  (NICODERM CQ) 21 mg/24hr patch USE 1- 21 MG PATCH DAILY FOR 6 WEEKS, THEN DECREASE TO 14 MG DAILY FOR 2 WEEKS, THEN DECREASE TO 7 MG DAILY FOR 2 WEEKS. 70 patch 0   nitroGLYCERIN (NITROSTAT) 0.4 MG SL tablet Place 1 tablet (0.4 mg total) under the tongue every 5 (five) minutes as needed for chest pain. 25 tablet 2   nitroGLYCERIN (NITROSTAT) 0.4 MG SL tablet Place 1 tablet (0.4 mg total) under the tongue every 5 (five) minutes as needed for chest pain. 25 tablet 3   nystatin cream (MYCOSTATIN) Apply 1 application topically 2 (two) times daily as needed.  5   ondansetron (ZOFRAN) 4 MG tablet TAKE 1 TABLET(4 MG) BY MOUTH TWICE DAILY AS NEEDED FOR NAUSEA OR VOMITING 20 tablet 1   oxyCODONE (OXY IR/ROXICODONE) 5 MG immediate release tablet Take 1 tablet by mouth every 4 (four) hours as needed for moderate pain.   0   rosuvastatin (CRESTOR) 20 MG tablet Take 1 tablet (20 mg total) by mouth daily. 90 tablet 3   No current facility-administered medications for this visit.      Past Surgical History:  Procedure Laterality Date   ABDOMINAL AORTOGRAM N/A 08/02/2016   Procedure: Abdominal Aortogram;  Surgeon: Nelva Bush, MD;  Location: [facility];  Service: Cardiovascular;  Laterality: N/A;   CAROTID STENT     CHOLECYSTECTOMY OPEN  1978   CORONARY ANGIOPLASTY WITH STENT PLACEMENT  08/02/2016   to RCA   CORONARY STENT INTERVENTION N/A 08/02/2016   Procedure: Coronary Stent Intervention;  Surgeon: Nelva Bush, MD;  Location: [facility];  Service: Cardiovascular;  Laterality: N/A;  RCA   ENDARTERECTOMY Left 12/02/2014   Procedure: ENDARTERECTOMY CAROTID;  Surgeon: Rosetta Posner, MD;  Location: [facility];  Service: Vascular;  Laterality: Left;   LEFT HEART CATH AND CORONARY ANGIOGRAPHY N/A 08/02/2016   Procedure: Left Heart Cath and Coronary Angiography;  Surgeon: Nelva Bush, MD;  Location: [facility];  Service: Cardiovascular;  Laterality: N/A;   LOWER  EXTREMITY ANGIOGRAM  08/02/2016   LOWER EXTREMITY ANGIOGRAPHY N/A 08/02/2016   Procedure: Lower Extremity Angiography;  Surgeon: Nelva Bush, MD;  Location: [facility];  Service: Cardiovascular;  Laterality: N/A;   VAGINAL HYSTERECTOMY  1991   "partial"     Allergies  Allergen Reactions   Codeine Shortness Of Breath   Latex Itching   Sulfa Antibiotics Other (See Comments)    Reaction:  Unknown       Family History  Problem Relation Age of Onset   Congestive Heart Failure Mother    COPD Father  Cancer Brother    Cancer Brother      Social History Ms. Willhelm reports that she has been smoking cigarettes. She started smoking about 44 years ago. She has a 86.00 pack-year smoking history. She has never used smokeless tobacco. Ms. Manocchio reports no history of alcohol use.   Review of Systems CONSTITUTIONAL: No weight loss, fever, chills, weakness or fatigue.  HEENT: Eyes: No visual loss, blurred vision, double vision or yellow sclerae.No hearing loss, sneezing, congestion, runny nose or sore throat.  SKIN: No rash or itching.  CARDIOVASCULAR: per hpi RESPIRATORY: No shortness of breath, cough or sputum.  GASTROINTESTINAL: No anorexia, nausea, vomiting or diarrhea. No abdominal pain or blood.  GENITOURINARY: No burning on urination, no polyuria NEUROLOGICAL: No headache, dizziness, syncope, paralysis, ataxia, numbness or tingling in the extremities. No change in bowel or bladder control.  MUSCULOSKELETAL: No muscle, back pain, joint pain or stiffness.  LYMPHATICS: No enlarged nodes. No history of splenectomy.  PSYCHIATRIC: No history of depression or anxiety.  ENDOCRINOLOGIC: No reports of sweating, cold or heat intolerance. No polyuria or polydipsia.  Marland Kitchen   Physical Examination Today's Vitals   01/01/19 1312  BP: 128/62  Pulse: 96  Temp: 97.6 F (36.4 C)  SpO2: 98%  Weight: 169 lb (76.7 kg)  Height: 5\' 6"  (1.676 m)   Body mass index is 27.28  kg/m.  Gen: resting comfortably, no acute distress HEENT: no scleral icterus, pupils equal round and reactive, no palptable cervical adenopathy,  CV: RRR, 2/6 systolic murmur rusb. Bilateral carotid bruits Resp: Clear to auscultation bilaterally GI: abdomen is soft, non-tender, non-distended, normal bowel sounds, no hepatosplenomegaly MSK: extremities are warm, no edema.  Skin: warm, no rash Neuro:  no focal deficits Psych: appropriate affect   Diagnostic Studies  11/2014 echo Study Conclusions  - Left ventricle: The cavity size was normal. There was mild focal basal hypertrophy of the septum. Systolic function was normal. The estimated ejection fraction was in the range of 60% to 65%. Wall motion was normal; there were no regional wall motion abnormalities. Doppler parameters are consistent with abnormal left ventricular relaxation (grade 1 diastolic dysfunction). - Aortic valve: Trileaflet; mildly calcified leaflets. There was trivial regurgitation. - Mitral valve: Calcified annulus. - Right atrium: Central venous pressure (est): 3 mm Hg. - Atrial septum: There was increased thickness of the septum, consistent with lipomatous hypertrophy. No defect or patent foramen ovale was identified. - Tricuspid valve: There was trivial regurgitation. - Pulmonary arteries: Systolic pressure could not be accurately estimated. - Pericardium, extracardiac: A prominent pericardial fat pad was present.  Impressions:  - Mild basal septal LV hypertrophy with LVEF 60-65% and grade 1 diastolic dysfunction. Mildly calcified mitral annulus as well as mildly sclerotic aortic valve. Trivial aortic regurgitation. No obvious PFO or ASD.  07/2016 Nuclear stress  No diagnostic ST segment changes to indicate ischemia with Lexiscan.  Small, moderate intensity, apical anteroseptal defect is partially reversible suggestive of a mild ischemic territory. Thr TID ratio is  also significantly increased at 1.67 suggesting the possibility of subendocardial or balanced multivessel ischemia.  This is a high risk study based predominantly on the significantly elevated TID ratio.  Nuclear stress EF: 63%.   07/2016 cath  Prox LAD to Mid LAD lesion, 20 %stenosed.  Conclusions: 1. Significant 2 vessel coronary artery disease, including 60-70% mid LAD and 90% proximal RCA stenoses. 2. Mildly elevated left ventricular filling pressure. 3. Aortic atherosclerosis with mild to moderate bilateral common iliac artery disease. 4. 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 5. Bilateral two-vessel runoff. 6. Successful PCI to the proximal RCA with placement of a Promus Premier 2.5 x 16 mm drug-eluting stent with 0% residual stenosis and TIMI-3 flow.  Recommendations: 1. Continue dual antiplatelet therapy with aspirin and clopidogrel for at least 6 months, ideally longer. 2. If patient has recurrent chest pain, consider FFR guided PCI (likely requiring orbital atherectomy) to the mid LAD. 3. Peripheral angiography reviewed with Dr. Gwenlyn Found; endovascular intervention to left SFA could be considered in the future, if claudication persists.   Assessment and Plan   1. CAD - Medically manage moderate LAD lesion. If recurrent symptoms consider FFR PCI of LAD. - continue current therapy. Due to extensive CAD, PAD, and carotid disease have elected to continue DAPT.   - no consistent chest pain, continue current medical therapy.    2. PAD - continue medical therapy, f/u with vascular  3. Hyperlipidemia -tolerating crestor 20mg  daily, did not tolerate high dosing.  - f/u repeat labs. If LDL not <70 consider adding zetia   4. HTN - above goal based on home numbers and last pcp visit, increase lisinopril to 10mg  daily - has upcoming labs with pcp including a cmet  5. Heart murmur - obtain echo   Arnoldo Lenis, M.D.

## 2019-01-08 ENCOUNTER — Ambulatory Visit (INDEPENDENT_AMBULATORY_CARE_PROVIDER_SITE_OTHER): Payer: BC Managed Care – PPO | Admitting: "Endocrinology

## 2019-01-08 ENCOUNTER — Other Ambulatory Visit: Payer: Self-pay

## 2019-01-08 ENCOUNTER — Encounter: Payer: Self-pay | Admitting: "Endocrinology

## 2019-01-08 VITALS — BP 137/79 | HR 83 | Ht 66.0 in | Wt 169.0 lb

## 2019-01-08 DIAGNOSIS — E1159 Type 2 diabetes mellitus with other circulatory complications: Secondary | ICD-10-CM | POA: Diagnosis not present

## 2019-01-08 DIAGNOSIS — E1165 Type 2 diabetes mellitus with hyperglycemia: Secondary | ICD-10-CM | POA: Insufficient documentation

## 2019-01-08 MED ORDER — TRULICITY 0.75 MG/0.5ML ~~LOC~~ SOAJ: 0.7500 mg | SUBCUTANEOUS | 2 refills | Status: DC

## 2019-01-08 MED ORDER — METFORMIN HCL 500 MG PO TABS: 500.0000 mg | ORAL_TABLET | Freq: Two times a day (BID) | ORAL | 2 refills | Status: DC

## 2019-01-08 NOTE — Progress Notes (Signed)
Endocrinology Consult Note       01/08/2019, 3:13 PM   Subjective:    Patient ID: Grace Hess, female    DOB: [birth year].  Grace Hess is being seen in consultation for management of currently uncontrolled symptomatic diabetes requested by  Maryruth Hancock, MD.   Past Medical History:  Diagnosis Date  . Anxiety   . Arthritis    "might have a touch in my fingers" (08/02/2016)  . Cervicalgia   . Chest pain, unspecified   . GERD (gastroesophageal reflux disease)   . Headache    "bad ones after my stroke in 2016; only have them when I get bad news now" (08/02/2016)  . Heart murmur   . Heavy smoker (more than 20 cigarettes per day)    Scheduled for LDCT screening 02/11/15  . Hepatitis A    "related to [employer]"  . History of hiatal hernia   . History of kidney stones    "no cysto/OR" (12/10/2014)  . Hypercholesteremia   . Hypertension   . Obesity   . Reflux esophagitis   . Stroke [facility])    "they said I had a silent stroke a few months back/MRI" (12/10/2014)  . TIA (transient ischemic attack) 11/28/2014   [name] 11/28/2014  . Type II diabetes mellitus ([place])     Past Surgical History:  Procedure Laterality Date  . ABDOMINAL AORTOGRAM N/A 08/02/2016   Procedure: Abdominal Aortogram;  Surgeon: Nelva Bush, MD;  Location: [place] [facility];  Service: Cardiovascular;  Laterality: N/A;  . CAROTID STENT    . CHOLECYSTECTOMY OPEN  1978  . CORONARY ANGIOPLASTY WITH STENT PLACEMENT  08/02/2016   to RCA  . CORONARY STENT INTERVENTION N/A 08/02/2016   Procedure: Coronary Stent Intervention;  Surgeon: Nelva Bush, MD;  Location: [place] [facility];  Service: Cardiovascular;  Laterality: N/A;  RCA  . ENDARTERECTOMY Left 12/02/2014   Procedure: ENDARTERECTOMY CAROTID;  Surgeon: Rosetta Posner, MD;  Location: [place];  Service: Vascular;  Laterality: Left;  . LEFT HEART CATH AND CORONARY ANGIOGRAPHY  N/A 08/02/2016   Procedure: Left Heart Cath and Coronary Angiography;  Surgeon: Nelva Bush, MD;  Location: [place] [facility];  Service: Cardiovascular;  Laterality: N/A;  . LOWER EXTREMITY ANGIOGRAM  08/02/2016  . LOWER EXTREMITY ANGIOGRAPHY N/A 08/02/2016   Procedure: Lower Extremity Angiography;  Surgeon: Nelva Bush, MD;  Location: [place] [facility];  Service: Cardiovascular;  Laterality: N/A;  . VAGINAL HYSTERECTOMY  1991   "partial"    Social History   Socioeconomic History  . Marital status: Married    Spouse name: Not on file  . Number of children: 2  . Years of education: 32  . Highest education level: Not on file  Occupational History  . Not on file  Social Needs  . Financial resource strain: Not on file  . Food insecurity    Worry: Not on file    Inability: Not on file  . Transportation needs    Medical: Not on file    Non-medical: Not on file  Tobacco Use  . Smoking status: Current Every Day Smoker  Packs/day: 2.00    Years: 43.00    Pack years: 86.00    Types: Cigarettes    Start date: 05/17/1974  . Smokeless tobacco: Never Used  Substance and Sexual Activity  . Alcohol use: No    Alcohol/week: 0.0 standard drinks  . Drug use: No  . Sexual activity: Never  Lifestyle  . Physical activity    Days per week: Not on file    Minutes per session: Not on file  . Stress: Not on file  Relationships  . Social Herbalist on phone: Not on file    Gets together: Not on file    Attends religious service: Not on file    Active member of club or organization: Not on file    Attends meetings of clubs or organizations: Not on file    Relationship status: Not on file  Other Topics Concern  . Not on file  Social History Narrative   Patient does not drink caffeine.   Patient is right handed.     Family History  Problem Relation Age of Onset  . Congestive Heart Failure Mother   . COPD Father   . Cancer Brother   . Cancer Brother      Outpatient Encounter Medications as of 01/08/2019  Medication Sig  . acetaminophen (TYLENOL) 325 MG tablet Take 2 tablets (650 mg total) by mouth every 4 (four) hours as needed for headache or mild pain.  Marland Kitchen ALPRAZolam (XANAX) 1 MG tablet Take 0.5 mg by mouth 3 (three) times daily as needed.   Marland Kitchen aspirin EC 81 MG tablet Take 1 tablet (81 mg total) by mouth daily. Please continue Aspirin and plavix for 6 months, then plavix daily  . clopidogrel (PLAVIX) 75 MG tablet Take 1 tablet (75 mg total) by mouth daily.  Marland Kitchen dicyclomine (BENTYL) 10 MG capsule Take 1 capsule (10 mg total) by mouth 2 (two) times daily.  . Dulaglutide (TRULICITY) 6.96 EX/5.2WU SOPN Inject 0.75 mg into the skin once a week.  . gabapentin (NEURONTIN) 300 MG capsule Take 300 mg by mouth 2 (two) times daily.   Marland Kitchen glipiZIDE (GLUCOTROL) 5 MG tablet TK 1 T PO QD  . lansoprazole (PREVACID) 30 MG capsule TAKE 1 CAPSULE(30 MG) BY MOUTH DAILY BEFORE SUPPER  . lisinopril (ZESTRIL) 10 MG tablet Take 1 tablet (10 mg total) by mouth daily.  . metFORMIN (GLUCOPHAGE) 500 MG tablet Take 1 tablet (500 mg total) by mouth 2 (two) times daily with a meal.  . metoprolol tartrate (LOPRESSOR) 25 MG tablet Take 0.5 tablets (12.5 mg total) by mouth 2 (two) times daily.  . nicotine (NICODERM CQ) 21 mg/24hr patch USE 1- 21 MG PATCH DAILY FOR 6 WEEKS, THEN DECREASE TO 14 MG DAILY FOR 2 WEEKS, THEN DECREASE TO 7 MG DAILY FOR 2 WEEKS.  . nitroGLYCERIN (NITROSTAT) 0.4 MG SL tablet Place 1 tablet (0.4 mg total) under the tongue every 5 (five) minutes as needed for chest pain.  . nitroGLYCERIN (NITROSTAT) 0.4 MG SL tablet Place 1 tablet (0.4 mg total) under the tongue every 5 (five) minutes as needed for chest pain.  Marland Kitchen nystatin cream (MYCOSTATIN) Apply 1 application topically 2 (two) times daily as needed.  . ondansetron (ZOFRAN) 4 MG tablet TAKE 1 TABLET(4 MG) BY MOUTH TWICE DAILY AS NEEDED FOR NAUSEA OR VOMITING  . oxyCODONE (OXY IR/ROXICODONE) 5 MG immediate  release tablet Take 1 tablet by mouth every 4 (four) hours as needed for moderate pain.   Marland Kitchen  rosuvastatin (CRESTOR) 20 MG tablet Take 1 tablet (20 mg total) by mouth daily.  . [DISCONTINUED] metFORMIN (GLUCOPHAGE) 1000 MG tablet Take 1 tablet (1,000 mg total) by mouth 2 (two) times daily with a meal.   No facility-administered encounter medications on file as of 01/08/2019.     ALLERGIES: Allergies  Allergen Reactions  . Codeine Shortness Of Breath  . Latex Itching  . Sulfa Antibiotics Other (See Comments)    Reaction:  Unknown     VACCINATION STATUS: Immunization History  Administered Date(s) Administered  . Influenza,inj,Quad PF,6+ Mos 11/29/2014  . Pneumococcal Polysaccharide-23 11/29/2014    Diabetes She presents for her initial diabetic visit. She has type 2 diabetes mellitus. Onset time: She was diagnosed at approximate age of 22 years. Her disease course has been worsening. There are no hypoglycemic associated symptoms. Pertinent negatives for hypoglycemia include no confusion, headaches, pallor or seizures. Associated symptoms include fatigue, foot paresthesias, polydipsia and polyuria. Pertinent negatives for diabetes include no chest pain and no polyphagia. There are no hypoglycemic complications. Symptoms are worsening. Diabetic complications include a CVA, heart disease and nephropathy. Risk factors for coronary artery disease include dyslipidemia, diabetes mellitus, family history, hypertension, tobacco exposure, sedentary lifestyle and post-menopausal. Current diabetic treatments: She is currently on Metformin 1000 mg twice daily, glipizide 5 mg daily. Her weight is fluctuating minimally. She is following a generally unhealthy diet. When asked about meal planning, she reported none. She has not had a previous visit with a dietitian. She never participates in exercise. (She did not bring any logs nor meter to review.  Recent A1c was 8.2% on December 21, 2018. ) An ACE  inhibitor/angiotensin II receptor blocker is being taken. Eye exam is current.  Hyperlipidemia This is a chronic problem. The current episode started more than 1 year ago. Exacerbating diseases include diabetes. Pertinent negatives include no chest pain, myalgias or shortness of breath. Current antihyperlipidemic treatment includes statins. Risk factors for coronary artery disease include diabetes mellitus, dyslipidemia, hypertension, a sedentary lifestyle, post-menopausal and family history.  Hypertension This is a chronic problem. The problem is controlled. Pertinent negatives include no chest pain, headaches, palpitations or shortness of breath. Risk factors for coronary artery disease include dyslipidemia, diabetes mellitus, sedentary lifestyle, smoking/tobacco exposure and post-menopausal state. Past treatments include ACE inhibitors. Hypertensive end-organ damage includes kidney disease, CAD/MI and CVA.     Review of Systems  Constitutional: Positive for fatigue. Negative for chills, fever and unexpected weight change.  HENT: Negative for trouble swallowing and voice change.   Eyes: Negative for visual disturbance.  Respiratory: Negative for cough, shortness of breath and wheezing.   Cardiovascular: Negative for chest pain, palpitations and leg swelling.  Gastrointestinal: Negative for diarrhea, nausea and vomiting.  Endocrine: Positive for polydipsia and polyuria. Negative for cold intolerance, heat intolerance and polyphagia.  Musculoskeletal: Negative for arthralgias and myalgias.  Skin: Negative for color change, pallor, rash and wound.  Neurological: Negative for seizures and headaches.  Psychiatric/Behavioral: Negative for confusion and suicidal ideas.    Objective:    BP 137/79   Pulse 83   Ht 5\' 6"  (1.676 m)   Wt 169 lb (76.7 kg)   BMI 27.28 kg/m   Wt Readings from Last 3 Encounters:  01/08/19 169 lb (76.7 kg)  01/01/19 169 lb (76.7 kg)  12/21/18 172 lb 6.4 oz (78.2 kg)      Physical Exam Constitutional:      Appearance: She is well-developed.  HENT:     Head: Normocephalic  and atraumatic.  Neck:     Musculoskeletal: Normal range of motion and neck supple.     Thyroid: No thyromegaly.     Trachea: No tracheal deviation.  Cardiovascular:     Rate and Rhythm: Normal rate.  Pulmonary:     Effort: Pulmonary effort is normal.  Abdominal:     Tenderness: There is no abdominal tenderness. There is no guarding.  Musculoskeletal: Normal range of motion.  Skin:    General: Skin is warm and dry.     Coloration: Skin is not pale.     Findings: No erythema or rash.  Neurological:     Mental Status: She is alert and oriented to person, place, and time.     Cranial Nerves: No cranial nerve deficit.     Coordination: Coordination normal.     Deep Tendon Reflexes: Reflexes are normal and symmetric.  Psychiatric:        Judgment: Judgment normal.       CMP ( most recent) CMP     Component Value Date/Time   NA 136 08/03/2016 0209   K 4.8 08/03/2016 0209   CL 105 08/03/2016 0209   CO2 20 (L) 08/03/2016 0209   GLUCOSE 207 (H) 08/03/2016 0209   BUN 30 (H) 08/03/2016 0209   CREATININE 1.20 (H) 08/03/2016 0209   CREATININE 1.10 (H) 07/29/2016 1100   CALCIUM 9.4 08/03/2016 0209   PROT 6.9 09/14/2015 2013   ALBUMIN 4.2 09/14/2015 2013   AST 22 09/14/2015 2013   ALT 24 09/14/2015 2013   ALKPHOS 58 09/14/2015 2013   BILITOT 0.5 09/14/2015 2013   GFRNONAA 48 (L) 08/03/2016 0209   GFRAA 55 (L) 08/03/2016 0209     Diabetic Labs (most recent): Lab Results  Component Value Date   HGBA1C 8.2 (A) 12/21/2018   HGBA1C 8.6 (H) 11/28/2014     Lipid Panel ( most recent) Lipid Panel     Component Value Date/Time   CHOL 173 03/22/2017 0857   TRIG 185 (H) 03/22/2017 0857   HDL 40 (L) 03/22/2017 0857   CHOLHDL 4.3 03/22/2017 0857   VLDL UNABLE TO CALCULATE IF TRIGLYCERIDE OVER 400 mg/dL 11/29/2014 0630   LDLCALC 102 (H) 03/22/2017 0857        Assessment & Plan:   1. DM type 2 causing vascular disease ([place])  - DHRUVI CRENSHAW has currently uncontrolled symptomatic type 2 DM since  63 years of age,  with most recent A1c of 8.2 %. Recent labs reviewed. - I had a long discussion with her about the progressive nature of diabetes and the pathology behind its complications. -her diabetes is complicated by coronary artery disease, CVA, nephropathy, peripheral neuropathy, chronic heavy smoking, sedentary life and she remains at a high risk for more acute and chronic complications which include CAD, CVA, CKD, retinopathy, and neuropathy. These are all discussed in detail with her.  - I have counseled her on diet  and weight management  by adopting a carbohydrate restricted/protein rich diet. Patient is encouraged to switch to  unprocessed or minimally processed     complex starch and increased protein intake (animal or plant source), fruits, and vegetables. -  she is advised to stick to a routine mealtimes to eat 3 meals  a day and avoid unnecessary snacks ( to snack only to correct hypoglycemia).   - she admits that there is a room for improvement in her food and drink choices. - Suggestion is made for her to avoid simple carbohydrates  from her diet including Cakes, Sweet Desserts, Ice Cream, Soda (diet and regular), Sweet Tea, Candies, Chips, Cookies, Store Bought Juices, Alcohol in Excess of  1-2 drinks a day, Artificial Sweeteners,  Coffee Creamer, and "Sugar-free" Products. This will help patient to have more stable blood glucose profile and potentially avoid unintended weight gain.  - she will be scheduled with Jearld Fenton, RDN, CDE for diabetes education.  - I have approached her with the following individualized plan to manage  her diabetes and patient agrees:   - she is approached to start strict monitoring of glucose 4 times a day-before meals and at bedtime, until her next visit in 10 days. -She will be considered for basal  insulin if she reports significantly above target glycemic profile. - she is encouraged to call clinic for blood glucose levels less than 70 or above 300 mg /dl. - she is advised to lower the dose of her Metformin to 500 mg p.o. twice daily with breakfast and lunch,  therapeutically suitable for patient .  - she is not a candidate for SGLT2 inhibitors due to concurrent renal insufficiency. -She is advised to continue glipizide 5 mg p.o. daily at breakfast. -In the meantime, I discussed initiated Trulicity 0.94 mg subcutaneously weekly.  - Specific targets for  A1c;  LDL, HDL, Triglycerides, and  Waist Circumference were discussed with the patient.  2) Blood Pressure /Hypertension:  her blood pressure is  controlled to target.   she is advised to continue her current medications including lisinopril 10 mg p.o. daily with breakfast . 3) Lipids/Hyperlipidemia:   Review of her recent lipid panel showed uncontrolled  LDL at 102 .  she  is advised to continue    Crestor 20 mg daily at bedtime.  Side effects and precautions discussed with her.  4)  Weight/Diet:  Body mass index is 27.28 kg/m.  -  she is  a candidate for weight loss. I discussed with her the fact that loss of 5 - 10% of her  current body weight will have the most impact on her diabetes management.  Exercise, and detailed carbohydrates information provided  -  detailed on discharge instructions.  5) Chronic Care/Health Maintenance:  -she  is on ACEI/ARB and Statin medications and  is encouraged to initiate and continue to follow up with Ophthalmology, Dentist,  Podiatrist at least yearly or according to recommendations, and advised to  quit stay away from smoking. I have recommended yearly flu vaccine and pneumonia vaccine at least every 5 years; moderate intensity exercise for up to 150 minutes weekly; and  sleep for at least 7 hours a day.  - she is  advised to maintain close follow up with [place], Rex Kras, MD for primary care needs, as  well as her other providers for optimal and coordinated care.  - Time spent with the patient: 45 minutes, of which >50% was spent in obtaining information about her symptoms, reviewing her previous labs/studies, evaluations, and treatments, counseling her about her currently uncontrolled type 2 diabetes complicated by coronary artery disease, CKD, CVA, peripheral neuropathy, chronic heavy smoking; hyperlipidemia, hypertension  developing plans for long term treatment based on the latest standards of care/guidelines.  Please refer to " Patient Self Inventory" in the Media  tab for reviewed elements of pertinent patient history.  Shanon Ace participated in the discussions, expressed understanding, and voiced agreement with the above plans.  All questions were answered to her satisfaction. she is encouraged to contact clinic should she  have any questions or concerns prior to her return visit.  Follow up plan: - Return in about 10 days (around 01/18/2019) for Follow up with Meter and Logs Only - no Labs.  Glade Lloyd, MD [facility] [address] [place], [place] [address] Phone: [phone]  Fax: [phone]    01/08/2019, 3:13 PM  This note was partially dictated with voice recognition software. Similar sounding words can be transcribed inadequately or may not  be corrected upon review.

## 2019-01-08 NOTE — Patient Instructions (Signed)

## 2019-01-09 ENCOUNTER — Telehealth: Payer: Self-pay | Admitting: "Endocrinology

## 2019-01-09 NOTE — Telephone Encounter (Signed)
Pt said that the Trulicity is costing around $300 and she can not afford that. She is asking for something else.

## 2019-01-09 NOTE — Telephone Encounter (Signed)
She will be better of on insulin, will consider next visit. Do not fill Trulicity. Keep testing, keep appointment.

## 2019-01-09 NOTE — Telephone Encounter (Signed)
Lft msg for pt to call back 

## 2019-01-10 NOTE — Telephone Encounter (Signed)
Pt notified. She does not agree to keep taking the MTF 500mg . She states she is going back to her old dosage of 1000mg  bid. She will continue to test.

## 2019-01-11 ENCOUNTER — Other Ambulatory Visit: Payer: Self-pay

## 2019-01-11 ENCOUNTER — Ambulatory Visit (HOSPITAL_COMMUNITY)
Admission: RE | Admit: 2019-01-11 | Discharge: 2019-01-11 | Disposition: A | Discharge status: Home / Self Care | Payer: BC Managed Care – PPO | Source: Ambulatory Visit | Attending: Cardiology | Admitting: Cardiology

## 2019-01-11 DIAGNOSIS — R011 Cardiac murmur, unspecified: Secondary | ICD-10-CM | POA: Diagnosis not present

## 2019-01-11 NOTE — Progress Notes (Signed)
*  PRELIMINARY RESULTS* Echocardiogram 2D Echocardiogram has been performed.  Samuel Germany 01/11/2019, 1:39 PM

## 2019-01-16 ENCOUNTER — Other Ambulatory Visit: Payer: Self-pay

## 2019-01-16 ENCOUNTER — Encounter (INDEPENDENT_AMBULATORY_CARE_PROVIDER_SITE_OTHER): Payer: Self-pay | Admitting: Internal Medicine

## 2019-01-16 ENCOUNTER — Telehealth: Payer: Self-pay | Admitting: Cardiology

## 2019-01-16 ENCOUNTER — Ambulatory Visit (INDEPENDENT_AMBULATORY_CARE_PROVIDER_SITE_OTHER): Payer: BC Managed Care – PPO | Admitting: Internal Medicine

## 2019-01-16 VITALS — BP 201/84 | HR 87 | Temp 98.5°F | Ht 66.0 in | Wt 166.3 lb

## 2019-01-16 DIAGNOSIS — K589 Irritable bowel syndrome without diarrhea: Secondary | ICD-10-CM | POA: Insufficient documentation

## 2019-01-16 DIAGNOSIS — E119 Type 2 diabetes mellitus without complications: Secondary | ICD-10-CM | POA: Diagnosis not present

## 2019-01-16 DIAGNOSIS — K219 Gastro-esophageal reflux disease without esophagitis: Secondary | ICD-10-CM | POA: Diagnosis not present

## 2019-01-16 DIAGNOSIS — I10 Essential (primary) hypertension: Secondary | ICD-10-CM | POA: Diagnosis not present

## 2019-01-16 DIAGNOSIS — E785 Hyperlipidemia, unspecified: Secondary | ICD-10-CM | POA: Diagnosis not present

## 2019-01-16 DIAGNOSIS — K580 Irritable bowel syndrome with diarrhea: Secondary | ICD-10-CM | POA: Diagnosis not present

## 2019-01-16 NOTE — Telephone Encounter (Signed)
Dr. Laural Golden called stating the patients Systolic pressure is 221 and wanted Dr. Harl Bowie to know since he put her on lisinopril, please call the patient w/ changes.

## 2019-01-16 NOTE — Progress Notes (Signed)
Presenting complaint;  Follow-up for GERD and IBS/diarrhea.  Database and subjective:  Grace Hess 63 year old Caucasian female who has chronic GERD and irritable bowel syndrome with diarrhea who is here for scheduled visit.  She was last seen in July 2019. Patient states she is now taking famotidine 20 mg every morning and is working well as far as her heartburn is concerned.  She is watching her diet maybe 50%.  She says when you go up and fried food it is hard to give it up altogether.  She denies dysphagia nausea or vomiting.  She remains with intermittent diarrhea.  On most days she has 2-3 stools.  Consistency semiformed to formed.  This morning she had 5 loose stools and took 2 mg of Imodium.  She states she ate peanuts last night and she believes as what caused the diarrhea.  She denies abdominal pain melena or rectal bleeding.  Her appetite is normal.  She has gained 6 pounds since her last visit.  She has had occasional constipation but not lately. She continues to smoke cigarettes.  She smokes about 2 packs/day.  She does not drink alcohol. She says she has not taken oxycodone in several months still has few pills left.  She had EGD back in March 2006 for dysphagia.  She had small sliding hiatal hernia small esophageal squamous papilloma.  Esophagus was dilated by passing 56 [place] Mal[place] dilator.  She was treated for H. pylori infection back in April 2006 with Prevpac.  Her last colonoscopy was in August 2010 revealing single diverticulum at sigmoid colon.   Current Medications: Outpatient Encounter Medications as of 01/16/2019  Medication Sig  . acetaminophen (TYLENOL) 325 MG tablet Take 2 tablets (650 mg total) by mouth every 4 (four) hours as needed for headache or mild pain.  Marland Kitchen ALPRAZolam (XANAX) 1 MG tablet Take 0.5 mg by mouth 3 (three) times daily as needed.   Marland Kitchen aspirin EC 81 MG tablet Take 1 tablet (81 mg total) by mouth daily. Please continue Aspirin and plavix for 6 months, then  plavix daily  . clopidogrel (PLAVIX) 75 MG tablet Take 1 tablet (75 mg total) by mouth daily.  Marland Kitchen dicyclomine (BENTYL) 10 MG capsule Take 1 capsule (10 mg total) by mouth 2 (two) times daily.  . famotidine (PEPCID) 20 MG tablet Take 20 mg by mouth daily.  Marland Kitchen gabapentin (NEURONTIN) 300 MG capsule Take 300 mg by mouth 2 (two) times daily.   Marland Kitchen glipiZIDE (GLUCOTROL) 5 MG tablet TK 1 T PO QD  . lisinopril (ZESTRIL) 10 MG tablet Take 1 tablet (10 mg total) by mouth daily.  . metFORMIN (GLUCOPHAGE) 500 MG tablet Take 1 tablet (500 mg total) by mouth 2 (two) times daily with a meal.  . metoprolol tartrate (LOPRESSOR) 25 MG tablet Take 0.5 tablets (12.5 mg total) by mouth 2 (two) times daily.  . nicotine (NICODERM CQ) 21 mg/24hr patch USE 1- 21 MG PATCH DAILY FOR 6 WEEKS, THEN DECREASE TO 14 MG DAILY FOR 2 WEEKS, THEN DECREASE TO 7 MG DAILY FOR 2 WEEKS.  . nitroGLYCERIN (NITROSTAT) 0.4 MG SL tablet Place 1 tablet (0.4 mg total) under the tongue every 5 (five) minutes as needed for chest pain.  Marland Kitchen nystatin cream (MYCOSTATIN) Apply 1 application topically 2 (two) times daily as needed.  . ondansetron (ZOFRAN) 4 MG tablet TAKE 1 TABLET(4 MG) BY MOUTH TWICE DAILY AS NEEDED FOR NAUSEA OR VOMITING  . oxyCODONE (OXY IR/ROXICODONE) 5 MG immediate release tablet Take 1 tablet  by mouth every 4 (four) hours as needed for moderate pain.   . rosuvastatin (CRESTOR) 20 MG tablet Take 1 tablet (20 mg total) by mouth daily.  . [DISCONTINUED] nitroGLYCERIN (NITROSTAT) 0.4 MG SL tablet Place 1 tablet (0.4 mg total) under the tongue every 5 (five) minutes as needed for chest pain.  . [DISCONTINUED] Dulaglutide (TRULICITY) 4.17 EY/8.1KG SOPN Inject 0.75 mg into the skin once a week. (Patient not taking: Reported on 01/16/2019)  . [DISCONTINUED] lansoprazole (PREVACID) 30 MG capsule TAKE 1 CAPSULE(30 MG) BY MOUTH DAILY BEFORE SUPPER (Patient not taking: Reported on 01/16/2019)   No facility-administered encounter medications on file  as of 01/16/2019.      Objective: Blood pressure (!) 201/84, pulse 87, temperature 98.5 F (36.9 C), temperature source Oral, height 5\' 6"  (1.676 m), weight 166 lb 4.8 oz (75.4 kg). Patient is alert and in no acute distress. Conjunctiva is pink. Sclera is nonicteric Oropharyngeal mucosa is normal. No neck masses or thyromegaly noted. Cardiac exam with regular rhythm normal S1 and S2. No murmur or gallop noted. Lungs are clear to auscultation. Abdomen is full.  Bowel sounds are hyperactive.  She has long right subcostal scar.  Abdomen is soft and nontender with organomegaly or masses.  Liver edge is easily palpable and it is soft and nontender. No LE edema or clubbing noted.   Assessment:  #1.  Chronic GERD.  She is doing well with dietary measures and low-dose H2B.  #2.  Irritable bowel syndrome with diarrhea.  She is doing well with low-dose dicyclomine.  She is having intermittent diarrhea triggered with foods.  Will screen her for celiac disease.  #3.  Hypertension.  She is seeing Dr. Harl Bowie.  Their office was called about the patient.  She will wait for the recommendations.  #4.  Patient is average risk for CRC.  Last colonoscopy was in August 2010.  Plan:  Celiac antibody panel. Patient to follow-up with Dr. Nelly Laurence office regarding her hypertension. Office visit in 6 months. We will plan [place] colonoscopy after her next visit.

## 2019-01-16 NOTE — Telephone Encounter (Signed)
To Dr. Harl Bowie

## 2019-01-16 NOTE — Patient Instructions (Signed)
Physician will call with results of blood test when completed. 

## 2019-01-17 LAB — COMPREHENSIVE METABOLIC PANEL
AG Ratio: 1.8 (calc) (ref 1.0–2.5)
ALT: 11 U/L (ref 6–29)
AST: 11 U/L (ref 10–35)
Albumin: 4.7 g/dL (ref 3.6–5.1)
Alkaline phosphatase (APISO): 63 U/L (ref 37–153)
BUN/Creatinine Ratio: 23 (calc) — ABNORMAL HIGH (ref 6–22)
BUN: 26 mg/dL — ABNORMAL HIGH (ref 7–25)
CO2: 25 mmol/L (ref 20–32)
Calcium: 9.4 mg/dL (ref 8.6–10.4)
Chloride: 105 mmol/L (ref 98–110)
Creat: 1.11 mg/dL — ABNORMAL HIGH (ref 0.50–0.99)
Globulin: 2.6 g/dL (calc) (ref 1.9–3.7)
Glucose, Bld: 192 mg/dL — ABNORMAL HIGH (ref 65–99)
Potassium: 4.6 mmol/L (ref 3.5–5.3)
Sodium: 139 mmol/L (ref 135–146)
Total Bilirubin: 0.4 mg/dL (ref 0.2–1.2)
Total Protein: 7.3 g/dL (ref 6.1–8.1)

## 2019-01-17 LAB — TSH: TSH: 0.99 mIU/L (ref 0.40–4.50)

## 2019-01-17 LAB — LIPID PANEL
Cholesterol: 143 mg/dL (ref <200)
HDL: 40 mg/dL — ABNORMAL LOW (ref >=50)
LDL Cholesterol (Calc): 69 mg/dL (calc)
Non-HDL Cholesterol (Calc): 103 mg/dL (calc) (ref <130)
Total CHOL/HDL Ratio: 3.6 (calc) (ref <5.0)
Triglycerides: 244 mg/dL — ABNORMAL HIGH (ref <150)

## 2019-01-17 LAB — HEMOGLOBIN A1C
Hgb A1c MFr Bld: 7.9 % of total Hgb — ABNORMAL HIGH (ref <5.7)
Mean Plasma Glucose: 180 (calc)
eAG (mmol/L): 10 (calc)

## 2019-01-17 MED ORDER — LISINOPRIL 20 MG PO TABS: 20.0000 mg | ORAL_TABLET | Freq: Every day | ORAL | 3 refills | Status: DC

## 2019-01-17 NOTE — Telephone Encounter (Signed)
Increase lisinopril to 20mg  daily, come for nursing vitals check next week   Zandra Abts MD

## 2019-01-17 NOTE — Telephone Encounter (Signed)
I spoke with patient, she will increase lisinopril to 20 mg daily, we will make nurse apt for bp check on 01/24/2019 at 4 pm

## 2019-01-18 ENCOUNTER — Encounter: Payer: Self-pay | Admitting: "Endocrinology

## 2019-01-18 ENCOUNTER — Other Ambulatory Visit: Payer: Self-pay

## 2019-01-18 ENCOUNTER — Ambulatory Visit (INDEPENDENT_AMBULATORY_CARE_PROVIDER_SITE_OTHER): Payer: BC Managed Care – PPO | Admitting: "Endocrinology

## 2019-01-18 DIAGNOSIS — E1159 Type 2 diabetes mellitus with other circulatory complications: Secondary | ICD-10-CM | POA: Diagnosis not present

## 2019-01-18 MED ORDER — GLIPIZIDE 5 MG PO TABS: 5.0000 mg | ORAL_TABLET | Freq: Two times a day (BID) | ORAL | 3 refills | Status: DC

## 2019-01-18 NOTE — Progress Notes (Signed)
01/18/2019, 3:56 PM                                                    Endocrinology Telehealth Visit Follow up Note -During COVID -19 Pandemic  This visit type was conducted due to national recommendations for restrictions regarding the COVID-19 Pandemic  in an effort to limit this patient's exposure and mitigate transmission of the corona virus.  Due to her co-morbid illnesses, Grace Hess is at  moderate to high risk for complications without adequate follow up.  This format is felt to be most appropriate for her at this time.  I connected with this patient on 01/18/2019   by telephone and verified that I am speaking with the correct person using two identifiers. Grace Hess, 31-Jul-1955. she has verbally consented to this visit. All issues noted in this document were discussed and addressed. The format was not optimal for physical exam.    Subjective:    Patient ID: Grace Hess, female    DOB: 07-25-1955.  Grace Hess is being engaged in telehealth via telephone after she was seen in consultation for management of currently uncontrolled symptomatic diabetes requested by  Maryruth Hancock, MD.   Past Medical History:  Diagnosis Date  . Anxiety   . Arthritis    "might have a touch in my fingers" (08/02/2016)  . Cervicalgia   . Chest pain, unspecified   . GERD (gastroesophageal reflux disease)   . Headache    "bad ones after my stroke in 2016; only have them when I get bad news now" (08/02/2016)  . Heart murmur   . Heavy smoker (more than 20 cigarettes per day)    Scheduled for LDCT screening 02/11/15  . Hepatitis A    "related to [employer]"  . History of hiatal hernia   . History of kidney stones    "no cysto/OR" (12/10/2014)  . Hypercholesteremia   . Hypertension   . Obesity   . Reflux esophagitis   . Stroke [facility])    "they said I had a silent stroke a few months back/MRI"  (12/10/2014)  . TIA (transient ischemic attack) 11/28/2014   [name] 11/28/2014  . Type II diabetes mellitus ([place])     Past Surgical History:  Procedure Laterality Date  . ABDOMINAL AORTOGRAM N/A 08/02/2016   Procedure: Abdominal Aortogram;  Surgeon: Nelva Bush, MD;  Location: [place] [facility];  Service: Cardiovascular;  Laterality: N/A;  . CAROTID STENT    . CHOLECYSTECTOMY OPEN  1978  . CORONARY ANGIOPLASTY WITH STENT PLACEMENT  08/02/2016   to RCA  . CORONARY STENT INTERVENTION N/A 08/02/2016   Procedure: Coronary Stent Intervention;  Surgeon: Nelva Bush, MD;  Location: [place] [facility];  Service: Cardiovascular;  Laterality: N/A;  RCA  . ENDARTERECTOMY Left 12/02/2014   Procedure: ENDARTERECTOMY CAROTID;  Surgeon: Rosetta Posner, MD;  Location: [place];  Service: Vascular;  Laterality: Left;  .  LEFT HEART CATH AND CORONARY ANGIOGRAPHY N/A 08/02/2016   Procedure: Left Heart Cath and Coronary Angiography;  Surgeon: Nelva Bush, MD;  Location: [place] [facility];  Service: Cardiovascular;  Laterality: N/A;  . LOWER EXTREMITY ANGIOGRAM  08/02/2016  . LOWER EXTREMITY ANGIOGRAPHY N/A 08/02/2016   Procedure: Lower Extremity Angiography;  Surgeon: Nelva Bush, MD;  Location: [place] [facility];  Service: Cardiovascular;  Laterality: N/A;  . VAGINAL HYSTERECTOMY  1991   "partial"    Social History   Socioeconomic History  . Marital status: Married    Spouse name: Not on file  . Number of children: 2  . Years of education: 42  . Highest education level: Not on file  Occupational History  . Not on file  Social Needs  . Financial resource strain: Not on file  . Food insecurity    Worry: Not on file    Inability: Not on file  . Transportation needs    Medical: Not on file    Non-medical: Not on file  Tobacco Use  . Smoking status: Current Every Day Smoker    Packs/day: 2.00    Years: 43.00    Pack years: 86.00    Types: Cigarettes    Start date: 05/17/1974  .  Smokeless tobacco: Never Used  Substance and Sexual Activity  . Alcohol use: No    Alcohol/week: 0.0 standard drinks  . Drug use: No  . Sexual activity: Never  Lifestyle  . Physical activity    Days per week: Not on file    Minutes per session: Not on file  . Stress: Not on file  Relationships  . Social Herbalist on phone: Not on file    Gets together: Not on file    Attends religious service: Not on file    Active member of club or organization: Not on file    Attends meetings of clubs or organizations: Not on file    Relationship status: Not on file  Other Topics Concern  . Not on file  Social History Narrative   Patient does not drink caffeine.   Patient is right handed.     Family History  Problem Relation Age of Onset  . Congestive Heart Failure Mother   . COPD Father   . Cancer Brother   . Cancer Brother     Outpatient Encounter Medications as of 01/18/2019  Medication Sig  . acetaminophen (TYLENOL) 325 MG tablet Take 2 tablets (650 mg total) by mouth every 4 (four) hours as needed for headache or mild pain.  Marland Kitchen ALPRAZolam (XANAX) 1 MG tablet Take 0.5 mg by mouth 3 (three) times daily as needed.   Marland Kitchen aspirin EC 81 MG tablet Take 1 tablet (81 mg total) by mouth daily. Please continue Aspirin and plavix for 6 months, then plavix daily  . clopidogrel (PLAVIX) 75 MG tablet Take 1 tablet (75 mg total) by mouth daily.  Marland Kitchen dicyclomine (BENTYL) 10 MG capsule Take 1 capsule (10 mg total) by mouth 2 (two) times daily.  . famotidine (PEPCID) 20 MG tablet Take 20 mg by mouth daily.  Marland Kitchen gabapentin (NEURONTIN) 300 MG capsule Take 300 mg by mouth 2 (two) times daily.   Marland Kitchen glipiZIDE (GLUCOTROL) 5 MG tablet Take 1 tablet (5 mg total) by mouth 2 (two) times daily with a meal.  . lisinopril (ZESTRIL) 20 MG tablet Take 1 tablet (20 mg total) by mouth daily.  . metFORMIN (GLUCOPHAGE) 500 MG tablet Take 1 tablet (  500 mg total) by mouth 2 (two) times daily with a meal.  . metoprolol  tartrate (LOPRESSOR) 25 MG tablet Take 0.5 tablets (12.5 mg total) by mouth 2 (two) times daily.  . nicotine (NICODERM CQ) 21 mg/24hr patch USE 1- 21 MG PATCH DAILY FOR 6 WEEKS, THEN DECREASE TO 14 MG DAILY FOR 2 WEEKS, THEN DECREASE TO 7 MG DAILY FOR 2 WEEKS.  . nitroGLYCERIN (NITROSTAT) 0.4 MG SL tablet Place 1 tablet (0.4 mg total) under the tongue every 5 (five) minutes as needed for chest pain.  Marland Kitchen nystatin cream (MYCOSTATIN) Apply 1 application topically 2 (two) times daily as needed.  . ondansetron (ZOFRAN) 4 MG tablet TAKE 1 TABLET(4 MG) BY MOUTH TWICE DAILY AS NEEDED FOR NAUSEA OR VOMITING  . oxyCODONE (OXY IR/ROXICODONE) 5 MG immediate release tablet Take 1 tablet by mouth every 4 (four) hours as needed for moderate pain.   . rosuvastatin (CRESTOR) 20 MG tablet Take 1 tablet (20 mg total) by mouth daily.  . [DISCONTINUED] glipiZIDE (GLUCOTROL) 5 MG tablet TK 1 T PO QD   No facility-administered encounter medications on file as of 01/18/2019.     ALLERGIES: Allergies  Allergen Reactions  . Codeine Shortness Of Breath  . Latex Itching  . Sulfa Antibiotics Other (See Comments)    Reaction:  Unknown     VACCINATION STATUS: Immunization History  Administered Date(s) Administered  . Influenza,inj,Quad PF,6+ Mos 11/29/2014  . Pneumococcal Polysaccharide-23 11/29/2014    Diabetes She presents for her follow-up diabetic visit. She has type 2 diabetes mellitus. Onset time: She was diagnosed at approximate age of 60 years. Her disease course has been improving. There are no hypoglycemic associated symptoms. Pertinent negatives for hypoglycemia include no confusion, headaches, pallor or seizures. Associated symptoms include fatigue and foot paresthesias. Pertinent negatives for diabetes include no chest pain, no polydipsia, no polyphagia and no polyuria. There are no hypoglycemic complications. Symptoms are worsening. Diabetic complications include a CVA, heart disease and nephropathy. Risk  factors for coronary artery disease include dyslipidemia, diabetes mellitus, family history, hypertension, tobacco exposure, sedentary lifestyle and post-menopausal. Current diabetic treatments: She is currently on Metformin 1000 mg twice daily, glipizide 5 mg daily. Her weight is decreasing steadily. She is following a generally unhealthy diet. When asked about meal planning, she reported none. She has not had a previous visit with a dietitian. She never participates in exercise. Her home blood glucose trend is fluctuating minimally. Her breakfast blood glucose range is generally 180-200 mg/dl. Her lunch blood glucose range is generally >200 mg/dl. Her dinner blood glucose range is generally >200 mg/dl. Her bedtime blood glucose range is generally >200 mg/dl. Her overall blood glucose range is >200 mg/dl. (She reports controlled glycemic profile to near target fasting, still above target postprandial blood glucose profile.  Her recent labs show A1c of 7.9% slightly improving from 8.2%.  She has no hypoglycemia.) An Hess inhibitor/angiotensin II receptor blocker is being taken. Eye exam is current.  Hyperlipidemia This is a chronic problem. The current episode started more than 1 year ago. Exacerbating diseases include diabetes. Pertinent negatives include no chest pain, myalgias or shortness of breath. Current antihyperlipidemic treatment includes statins. Risk factors for coronary artery disease include diabetes mellitus, dyslipidemia, hypertension, a sedentary lifestyle, post-menopausal and family history.  Hypertension This is a chronic problem. The problem is controlled. Pertinent negatives include no chest pain, headaches, palpitations or shortness of breath. Risk factors for coronary artery disease include dyslipidemia, diabetes mellitus, sedentary lifestyle, smoking/tobacco exposure  and post-menopausal state. Past treatments include Hess inhibitors. Hypertensive end-organ damage includes kidney disease,  CAD/MI and CVA.    Review of systems : Limited as above.  Objective:    There were no vitals taken for this visit.  Wt Readings from Last 3 Encounters:  01/16/19 166 lb 4.8 oz (75.4 kg)  01/08/19 169 lb (76.7 kg)  01/01/19 169 lb (76.7 kg)        CMP ( most recent) CMP     Component Value Date/Time   NA 139 01/16/2019 0937   K 4.6 01/16/2019 0937   CL 105 01/16/2019 0937   CO2 25 01/16/2019 0937   GLUCOSE 192 (H) 01/16/2019 0937   BUN 26 (H) 01/16/2019 0937   CREATININE 1.11 (H) 01/16/2019 0937   CALCIUM 9.4 01/16/2019 0937   PROT 7.3 01/16/2019 0937   ALBUMIN 4.2 09/14/2015 2013   AST 11 01/16/2019 0937   ALT 11 01/16/2019 0937   ALKPHOS 58 09/14/2015 2013   BILITOT 0.4 01/16/2019 0937   GFRNONAA 48 (L) 08/03/2016 0209   GFRAA 55 (L) 08/03/2016 0209     Diabetic Labs (most recent): Lab Results  Component Value Date   HGBA1C 7.9 (H) 01/16/2019   HGBA1C 8.2 (A) 12/21/2018   HGBA1C 8.6 (H) 11/28/2014     Lipid Panel ( most recent) Lipid Panel     Component Value Date/Time   CHOL 143 01/16/2019 0937   TRIG 244 (H) 01/16/2019 0937   HDL 40 (L) 01/16/2019 0937   CHOLHDL 3.6 01/16/2019 0937   VLDL UNABLE TO CALCULATE IF TRIGLYCERIDE OVER 400 mg/dL 11/29/2014 0630   LDLCALC 69 01/16/2019 0937       Assessment & Plan:   1. DM type 2 causing vascular disease ([place])  - Grace Hess has currently uncontrolled symptomatic type 2 DM since  63 years of age. Recent labs show A1c of 7.9%, slightly improving from 8.2%.  I reviewed her recent labs with her.   - I had a long discussion with her about the progressive nature of diabetes and the pathology behind its complications. -her diabetes is complicated by coronary artery disease, CVA, nephropathy, peripheral neuropathy, chronic heavy smoking, sedentary life and she remains at a high risk for more acute and chronic complications which include CAD, CVA, CKD, retinopathy, and neuropathy. These are all discussed  in detail with her.  - I have counseled her on diet  and weight management  by adopting a carbohydrate restricted/protein rich diet. Patient is encouraged to switch to  unprocessed or minimally processed     complex starch and increased protein intake (animal or plant source), fruits, and vegetables. -  she is advised to stick to a routine mealtimes to eat 3 meals  a day and avoid unnecessary snacks ( to snack only to correct hypoglycemia).   - she  admits there is a room for improvement in her diet and drink choices. -  Suggestion is made for her to avoid simple carbohydrates  from her diet including Cakes, Sweet Desserts / Pastries, Ice Cream, Soda (diet and regular), Sweet Tea, Candies, Chips, Cookies, Sweet Pastries,  Store Bought Juices, Alcohol in Excess of  1-2 drinks a day, Artificial Sweeteners, Coffee Creamer, and "Sugar-free" Products. This will help patient to have stable blood glucose profile and potentially avoid unintended weight gain.  - she will be scheduled with Jearld Fenton, RDN, CDE for diabetes education.  - I have approached her with the following individualized plan to manage  her diabetes and patient agrees:   -Based on her glycemic response, she will not need insulin treatment for now. - she is encouraged to continue monitoring blood glucose twice a day-daily before breakfast and at bedtime and report to clinic if she registers blood glucose readings less than 70 or greater than 200x3.  - she is advised to continue Metformin 500 mg p.o. twice daily-daily after breakfast and after supper.   -She is advised to increase her glipizide 5 mg p.o. twice daily-with breakfast and supper.  She did not afford Trulicity.   -She will stop by to get samples of Ozempic.  - Specific targets for  A1c;  LDL, HDL, Triglycerides, and  Waist Circumference were discussed with the patient.  2) Blood Pressure /Hypertension: She reports loss of control of blood pressure since last visit,  lisinopril was increased to 20 mg p.o. daily.  Her most recent reading is 160/88.   she is advised to continue her current medications including lisinopril 20 mg p.o. daily with breakfast , as well as metoprolol 25 mg p.o. twice daily.   3) Lipids/Hyperlipidemia:   Review of her recent lipid panel showed uncontrolled  LDL at 102 .  she  is advised to continue Crestor 20 mg p.o. daily at bedtime. Side effects and precautions discussed with her.  4)  Weight/Diet: Her BMI is 26.8-  she is  a candidate for some weight loss. I discussed with her the fact that loss of 5 - 10% of her  current body weight will have the most impact on her diabetes management.  Exercise, and detailed carbohydrates information provided  -  detailed on discharge instructions.  5) Chronic Care/Health Maintenance:  -she  is on ACEI/ARB and Statin medications and  is encouraged to initiate and continue to follow up with Ophthalmology, Dentist,  Podiatrist at least yearly or according to recommendations, and advised to  quit  smoking. I have recommended yearly flu vaccine and pneumonia vaccine at least every 5 years; moderate intensity exercise for up to 150 minutes weekly; and  sleep for at least 7 hours a day.  - she is  advised to maintain close follow up with [place], Rex Kras, MD for primary care needs, as well as her other providers for optimal and coordinated care.  - Patient Care Time Today:  25 min, of which >50% was spent in  counseling and the rest reviewing her  current and  previous labs/studies, previous treatments, her blood glucose readings, and medications' doses and developing a plan for long-term care based on the latest recommendations for standards of care.   Grace Hess participated in the discussions, expressed understanding, and voiced agreement with the above plans.  All questions were answered to her satisfaction. she is encouraged to contact clinic should she have any questions or concerns prior to her  return visit.   Follow up plan: - Return in about 3 months (around 04/20/2019) for Bring Meter and Logs- A1c in Office.  Glade Lloyd, MD [facility] [address] [place], [place] [address] Phone: [phone]  Fax: [phone]    01/18/2019, 3:56 PM  This note was partially dictated with voice recognition software. Similar sounding words can be transcribed inadequately or may not  be corrected upon review.

## 2019-01-19 LAB — CELIAC DISEASE PANEL
(tTG) Ab, IgA: 1 U/mL
(tTG) Ab, IgG: 1 U/mL
Deamidated Gliadin Abs, IgG: 1 U
Gliadin IgA: 3 U
Immunoglobulin A: 171 mg/dL (ref 70–320)

## 2019-01-24 ENCOUNTER — Other Ambulatory Visit: Payer: Self-pay

## 2019-01-24 ENCOUNTER — Ambulatory Visit (INDEPENDENT_AMBULATORY_CARE_PROVIDER_SITE_OTHER): Payer: BC Managed Care – PPO | Admitting: *Deleted

## 2019-01-24 VITALS — BP 130/72 | HR 85 | Temp 98.4°F | Ht 66.0 in

## 2019-01-24 DIAGNOSIS — I10 Essential (primary) hypertension: Secondary | ICD-10-CM

## 2019-01-24 NOTE — Progress Notes (Signed)
Pt states that she is feeling good today

## 2019-02-02 ENCOUNTER — Telehealth: Payer: Self-pay | Admitting: "Endocrinology

## 2019-02-02 NOTE — Telephone Encounter (Signed)
She will need all of them, call back if glucose drops to below 70.

## 2019-02-02 NOTE — Telephone Encounter (Signed)
Spoke w/ patient

## 2019-02-02 NOTE — Telephone Encounter (Signed)
Pt would like to know does she need to cut back on metformin since she is taking the ozempic. She was given samples

## 2019-02-12 DIAGNOSIS — G8929 Other chronic pain: Secondary | ICD-10-CM | POA: Diagnosis not present

## 2019-02-12 DIAGNOSIS — F419 Anxiety disorder, unspecified: Secondary | ICD-10-CM | POA: Diagnosis not present

## 2019-02-12 DIAGNOSIS — M25511 Pain in right shoulder: Secondary | ICD-10-CM | POA: Diagnosis not present

## 2019-02-12 DIAGNOSIS — M545 Low back pain: Secondary | ICD-10-CM | POA: Diagnosis not present

## 2019-02-22 ENCOUNTER — Ambulatory Visit: Payer: BC Managed Care – PPO | Admitting: Nutrition

## 2019-02-27 ENCOUNTER — Other Ambulatory Visit (INDEPENDENT_AMBULATORY_CARE_PROVIDER_SITE_OTHER): Payer: Self-pay | Admitting: Internal Medicine

## 2019-03-05 ENCOUNTER — Telehealth: Payer: Self-pay

## 2019-03-05 MED ORDER — ROSUVASTATIN CALCIUM 20 MG PO TABS: 20.0000 mg | ORAL_TABLET | Freq: Every day | ORAL | 3 refills | Status: DC

## 2019-03-05 NOTE — Telephone Encounter (Signed)
crestor refilled 

## 2019-03-26 ENCOUNTER — Ambulatory Visit: Payer: BC Managed Care – PPO | Admitting: Family Medicine

## 2019-04-03 ENCOUNTER — Ambulatory Visit: Payer: BC Managed Care – PPO | Admitting: Nutrition

## 2019-04-04 ENCOUNTER — Other Ambulatory Visit (HOSPITAL_COMMUNITY): Payer: Self-pay | Admitting: Internal Medicine

## 2019-04-04 DIAGNOSIS — Z1231 Encounter for screening mammogram for malignant neoplasm of breast: Secondary | ICD-10-CM

## 2019-04-10 ENCOUNTER — Telehealth: Payer: Self-pay | Admitting: "Endocrinology

## 2019-04-10 NOTE — Telephone Encounter (Signed)
Pt said that her sugar was 62 yesterday around 5pm This morning it was 91 and around 3pm today it was 85. She ate an apple and got it up to 103. Please advise

## 2019-04-10 NOTE — Telephone Encounter (Signed)
She can stop glipizide.

## 2019-04-10 NOTE — Telephone Encounter (Signed)
Pt.notified

## 2019-04-16 ENCOUNTER — Ambulatory Visit (HOSPITAL_COMMUNITY)
Admission: RE | Admit: 2019-04-16 | Discharge: 2019-04-16 | Disposition: A | Discharge status: Home / Self Care | Payer: 59 | Source: Ambulatory Visit | Attending: Internal Medicine | Admitting: Internal Medicine

## 2019-04-16 ENCOUNTER — Other Ambulatory Visit: Payer: Self-pay

## 2019-04-16 DIAGNOSIS — Z1231 Encounter for screening mammogram for malignant neoplasm of breast: Secondary | ICD-10-CM | POA: Diagnosis present

## 2019-04-18 ENCOUNTER — Other Ambulatory Visit (HOSPITAL_COMMUNITY): Payer: Self-pay | Admitting: Internal Medicine

## 2019-04-18 DIAGNOSIS — R928 Other abnormal and inconclusive findings on diagnostic imaging of breast: Secondary | ICD-10-CM

## 2019-04-23 ENCOUNTER — Ambulatory Visit: Payer: BC Managed Care – PPO | Admitting: "Endocrinology

## 2019-04-24 ENCOUNTER — Other Ambulatory Visit: Payer: Self-pay

## 2019-04-24 ENCOUNTER — Ambulatory Visit (HOSPITAL_COMMUNITY)
Admission: RE | Admit: 2019-04-24 | Discharge: 2019-04-24 | Disposition: A | Discharge status: Home / Self Care | Payer: 59 | Source: Ambulatory Visit | Attending: Internal Medicine | Admitting: Internal Medicine

## 2019-04-24 ENCOUNTER — Ambulatory Visit (HOSPITAL_COMMUNITY): Admission: RE | Admit: 2019-04-24 | Payer: 59 | Source: Ambulatory Visit

## 2019-04-24 DIAGNOSIS — R928 Other abnormal and inconclusive findings on diagnostic imaging of breast: Secondary | ICD-10-CM | POA: Insufficient documentation

## 2019-04-25 ENCOUNTER — Encounter: Payer: Self-pay | Admitting: "Endocrinology

## 2019-04-25 ENCOUNTER — Ambulatory Visit (INDEPENDENT_AMBULATORY_CARE_PROVIDER_SITE_OTHER): Payer: 59 | Admitting: "Endocrinology

## 2019-04-25 VITALS — BP 149/74 | HR 75 | Ht 66.0 in | Wt 160.2 lb

## 2019-04-25 DIAGNOSIS — E782 Mixed hyperlipidemia: Secondary | ICD-10-CM | POA: Diagnosis not present

## 2019-04-25 DIAGNOSIS — E1159 Type 2 diabetes mellitus with other circulatory complications: Secondary | ICD-10-CM

## 2019-04-25 DIAGNOSIS — I10 Essential (primary) hypertension: Secondary | ICD-10-CM

## 2019-04-25 LAB — POCT GLYCOSYLATED HEMOGLOBIN (HGB A1C): Hemoglobin A1C: 6.9 % — AB (ref 4.0–5.6)

## 2019-04-25 NOTE — Patient Instructions (Signed)

## 2019-04-25 NOTE — Progress Notes (Signed)
04/25/2019, 11:48 AM                                Endocrinology follow-up note   Subjective:    Patient ID: Grace Hess, female    DOB: 14-Mar-1956.  Grace Hess is being seen in follow-up for management of currently uncontrolled symptomatic diabetes requested by  Redmond School, MD.   Past Medical History:  Diagnosis Date  . Anxiety   . Arthritis    "might have a touch in my fingers" (08/02/2016)  . Cervicalgia   . Chest pain, unspecified   . GERD (gastroesophageal reflux disease)   . Headache    "bad ones after my stroke in 2016; only have them when I get bad news now" (08/02/2016)  . Heart murmur   . Heavy smoker (more than 20 cigarettes per day)    Scheduled for LDCT screening 02/11/15  . Hepatitis A    "related to [employer]"  . History of hiatal hernia   . History of kidney stones    "no cysto/OR" (12/10/2014)  . Hypercholesteremia   . Hypertension   . Obesity   . Reflux esophagitis   . Stroke [facility])    "they said I had a silent stroke a few months back/MRI" (12/10/2014)  . TIA (transient ischemic attack) 11/28/2014   [name] 11/28/2014  . Type II diabetes mellitus ([place])     Past Surgical History:  Procedure Laterality Date  . ABDOMINAL AORTOGRAM N/A 08/02/2016   Procedure: Abdominal Aortogram;  Surgeon: Nelva Bush, MD;  Location: [place] [facility];  Service: Cardiovascular;  Laterality: N/A;  . CAROTID STENT    . CHOLECYSTECTOMY OPEN  1978  . CORONARY ANGIOPLASTY WITH STENT PLACEMENT  08/02/2016   to RCA  . CORONARY STENT INTERVENTION N/A 08/02/2016   Procedure: Coronary Stent Intervention;  Surgeon: Nelva Bush, MD;  Location: [place] [facility];  Service: Cardiovascular;  Laterality: N/A;  RCA  . ENDARTERECTOMY Left 12/02/2014   Procedure: ENDARTERECTOMY CAROTID;  Surgeon: Rosetta Posner, MD;  Location: [place];  Service: Vascular;  Laterality: Left;  . LEFT HEART  CATH AND CORONARY ANGIOGRAPHY N/A 08/02/2016   Procedure: Left Heart Cath and Coronary Angiography;  Surgeon: Nelva Bush, MD;  Location: [place] [facility];  Service: Cardiovascular;  Laterality: N/A;  . LOWER EXTREMITY ANGIOGRAM  08/02/2016  . LOWER EXTREMITY ANGIOGRAPHY N/A 08/02/2016   Procedure: Lower Extremity Angiography;  Surgeon: Nelva Bush, MD;  Location: [place] [facility];  Service: Cardiovascular;  Laterality: N/A;  . VAGINAL HYSTERECTOMY  1991   "partial"    Social History   Socioeconomic History  . Marital status: Married    Spouse name: Not on file  . Number of children: 2  . Years of education: 60  . Highest education level: Not on file  Occupational History  . Not on file  Tobacco Use  . Smoking status: Current Every Day Smoker    Packs/day: 2.00    Years: 43.00    Pack years: 86.00  Types: Cigarettes    Start date: 05/17/1974  . Smokeless tobacco: Never Used  Substance and Sexual Activity  . Alcohol use: No    Alcohol/week: 0.0 standard drinks  . Drug use: No  . Sexual activity: Never  Other Topics Concern  . Not on file  Social History Narrative   Patient does not drink caffeine.   Patient is right handed.    Social Determinants of Health   Financial Resource Strain:   . Difficulty of Paying Living Expenses: Not on file  Food Insecurity:   . Worried About Charity fundraiser in the Last Year: Not on file  . Ran Out of Food in the Last Year: Not on file  Transportation Needs:   . Lack of Transportation (Medical): Not on file  . Lack of Transportation (Non-Medical): Not on file  Physical Activity:   . Days of Exercise per Week: Not on file  . Minutes of Exercise per Session: Not on file  Stress:   . Feeling of Stress : Not on file  Social Connections:   . Frequency of Communication with Friends and Family: Not on file  . Frequency of Social Gatherings with Friends and Family: Not on file  . Attends Religious Services: Not on file  .  Active Member of Clubs or Organizations: Not on file  . Attends Archivist Meetings: Not on file  . Marital Status: Not on file    Family History  Problem Relation Age of Onset  . Congestive Heart Failure Mother   . COPD Father   . Cancer Brother   . Cancer Brother     Outpatient Encounter Medications as of 04/25/2019  Medication Sig  . Semaglutide,0.25 or 0.5MG /DOS, 2 MG/1.5ML SOPN Inject 0.5 mg into the skin once a week.  Marland Kitchen acetaminophen (TYLENOL) 325 MG tablet Take 2 tablets (650 mg total) by mouth every 4 (four) hours as needed for headache or mild pain.  Marland Kitchen ALPRAZolam (XANAX) 0.5 MG tablet Take 0.5 mg by mouth 2 (two) times daily as needed.  Marland Kitchen aspirin EC 81 MG tablet Take 1 tablet (81 mg total) by mouth daily. Please continue Aspirin and plavix for 6 months, then plavix daily  . clopidogrel (PLAVIX) 75 MG tablet Take 1 tablet (75 mg total) by mouth daily.  Marland Kitchen dicyclomine (BENTYL) 10 MG capsule Take 1 capsule (10 mg total) by mouth 2 (two) times daily.  . famotidine (PEPCID) 20 MG tablet Take 20 mg by mouth daily.  Marland Kitchen gabapentin (NEURONTIN) 300 MG capsule Take 300 mg by mouth 2 (two) times daily.   Marland Kitchen LEXAPRO 10 MG tablet Take 1 tablet by mouth daily.  Marland Kitchen lisinopril (ZESTRIL) 20 MG tablet Take 1 tablet (20 mg total) by mouth daily.  . metFORMIN (GLUCOPHAGE) 500 MG tablet Take 1 tablet (500 mg total) by mouth 2 (two) times daily with a meal.  . metoprolol tartrate (LOPRESSOR) 25 MG tablet Take 0.5 tablets (12.5 mg total) by mouth 2 (two) times daily.  . nicotine (NICODERM CQ) 21 mg/24hr patch USE 1- 21 MG PATCH DAILY FOR 6 WEEKS, THEN DECREASE TO 14 MG DAILY FOR 2 WEEKS, THEN DECREASE TO 7 MG DAILY FOR 2 WEEKS.  . nitroGLYCERIN (NITROSTAT) 0.4 MG SL tablet Place 1 tablet (0.4 mg total) under the tongue every 5 (five) minutes as needed for chest pain.  Marland Kitchen nystatin cream (MYCOSTATIN) Apply 1 application topically 2 (two) times daily as needed.  . ondansetron (ZOFRAN) 4 MG tablet  TAKE 1  TABLET(4 MG) BY MOUTH TWICE DAILY AS NEEDED FOR NAUSEA OR VOMITING  . oxyCODONE (OXY IR/ROXICODONE) 5 MG immediate release tablet Take 1 tablet by mouth every 4 (four) hours as needed for moderate pain.   . rosuvastatin (CRESTOR) 20 MG tablet Take 1 tablet (20 mg total) by mouth daily.  . [DISCONTINUED] ALPRAZolam (XANAX) 1 MG tablet Take 0.5 mg by mouth 3 (three) times daily as needed.   . [DISCONTINUED] glipiZIDE (GLUCOTROL) 5 MG tablet Take 1 tablet (5 mg total) by mouth 2 (two) times daily with a meal.   No facility-administered encounter medications on file as of 04/25/2019.    ALLERGIES: Allergies  Allergen Reactions  . Codeine Shortness Of Breath  . Latex Itching  . Sulfa Antibiotics Other (See Comments)    Reaction:  Unknown     VACCINATION STATUS: Immunization History  Administered Date(s) Administered  . Influenza,inj,Quad PF,6+ Mos 11/29/2014  . Pneumococcal Polysaccharide-23 11/29/2014    Diabetes She presents for her follow-up diabetic visit. She has type 2 diabetes mellitus. Onset time: She was diagnosed at approximate age of 24 years. Her disease course has been improving. There are no hypoglycemic associated symptoms. Pertinent negatives for hypoglycemia include no confusion, headaches, pallor or seizures. Associated symptoms include fatigue and foot paresthesias. Pertinent negatives for diabetes include no chest pain, no polydipsia, no polyphagia and no polyuria. There are no hypoglycemic complications. Symptoms are improving. Diabetic complications include a CVA, heart disease and nephropathy. Risk factors for coronary artery disease include dyslipidemia, diabetes mellitus, family history, hypertension, tobacco exposure, sedentary lifestyle and post-menopausal. Current diabetic treatments: She is currently on Metformin 1000 mg twice daily, Ozempic 0.5 mg subcutaneously weekly, did not tolerate glipizide which she stopped.   Her weight is decreasing steadily. She is  following a generally unhealthy diet. When asked about meal planning, she reported none. She has not had a previous visit with a dietitian. She never participates in exercise. Her home blood glucose trend is decreasing steadily. Her breakfast blood glucose range is generally 130-140 mg/dl. Her lunch blood glucose range is generally >200 mg/dl. Her dinner blood glucose range is generally >200 mg/dl. Her bedtime blood glucose range is generally 140-180 mg/dl. Her overall blood glucose range is 140-180 mg/dl. (She presents with improving glycemic profile and point-of-care A1c 6.9%, progressively improving from 8.2%.     She has no hypoglycemia.) An ACE inhibitor/angiotensin II receptor blocker is being taken. Eye exam is current.  Hyperlipidemia This is a chronic problem. The current episode started more than 1 year ago. Exacerbating diseases include diabetes. Pertinent negatives include no chest pain, myalgias or shortness of breath. Current antihyperlipidemic treatment includes statins. Risk factors for coronary artery disease include diabetes mellitus, dyslipidemia, hypertension, a sedentary lifestyle, post-menopausal and family history.  Hypertension This is a chronic problem. The problem is controlled. Pertinent negatives include no chest pain, headaches, palpitations or shortness of breath. Risk factors for coronary artery disease include dyslipidemia, diabetes mellitus, sedentary lifestyle, smoking/tobacco exposure and post-menopausal state. Past treatments include ACE inhibitors. Hypertensive end-organ damage includes kidney disease, CAD/MI and CVA.     Review of systems  Constitutional: + Minimally fluctuating body weight,  current  Body mass index is 25.86 kg/m. , no fatigue, no subjective hyperthermia, no subjective hypothermia Eyes: no blurry vision, no xerophthalmia ENT: no sore throat, no nodules palpated in throat, no dysphagia/odynophagia, no hoarseness Cardiovascular: no Chest Pain, no  Shortness of Breath, no palpitations, no leg swelling Respiratory: no cough, no shortness of breath Gastrointestinal:  no Nausea/Vomiting/Diarhhea Musculoskeletal: no muscle/joint aches Skin: no rashes, no hyperemia Neurological: no tremors, no numbness, no tingling, no dizziness Psychiatric: no depression, no anxiety   Objective:    BP (!) 149/74   Pulse 75   Ht 5\' 6"  (1.676 m)   Wt 160 lb 3.2 oz (72.7 kg)   BMI 25.86 kg/m   Wt Readings from Last 3 Encounters:  04/25/19 160 lb 3.2 oz (72.7 kg)  01/16/19 166 lb 4.8 oz (75.4 kg)  01/08/19 169 lb (76.7 kg)      Physical Exam- Limited  Constitutional:  Body mass index is 25.86 kg/m. , not in acute distress, normal state of mind Eyes:  EOMI, no exophthalmos Neck: Supple Thyroid: No gross goiter Respiratory: Adequate breathing efforts Musculoskeletal: no gross deformities, strength intact in all four extremities, no gross restriction of joint movements Skin:  no rashes, no hyperemia Neurological: no tremor with outstretched hands,     CMP     Component Value Date/Time   NA 139 01/16/2019 0937   K 4.6 01/16/2019 0937   CL 105 01/16/2019 0937   CO2 25 01/16/2019 0937   GLUCOSE 192 (H) 01/16/2019 0937   BUN 26 (H) 01/16/2019 0937   CREATININE 1.11 (H) 01/16/2019 0937   CALCIUM 9.4 01/16/2019 0937   PROT 7.3 01/16/2019 0937   ALBUMIN 4.2 09/14/2015 2013   AST 11 01/16/2019 0937   ALT 11 01/16/2019 0937   ALKPHOS 58 09/14/2015 2013   BILITOT 0.4 01/16/2019 0937   GFRNONAA 48 (L) 08/03/2016 0209   GFRAA 55 (L) 08/03/2016 0209     Diabetic Labs (most recent): Lab Results  Component Value Date   HGBA1C 6.9 (A) 04/25/2019   HGBA1C 7.9 (H) 01/16/2019   HGBA1C 8.2 (A) 12/21/2018    Lipid Panel     Component Value Date/Time   CHOL 143 01/16/2019 0937   TRIG 244 (H) 01/16/2019 0937   HDL 40 (L) 01/16/2019 0937   CHOLHDL 3.6 01/16/2019 0937   VLDL UNABLE TO CALCULATE IF TRIGLYCERIDE OVER 400 mg/dL 11/29/2014  0630   LDLCALC 69 01/16/2019 0937       Assessment & Plan:   1. DM type 2 causing vascular disease ([place])  - BELINDA SCHLICHTING has currently uncontrolled symptomatic type 2 DM since  64 years of age. -She presents with controlled glycemic profile and A1c of 6.9%, improving from 8.2%.    - I had a long discussion with her about the progressive nature of diabetes and the pathology behind its complications. -her diabetes is complicated by coronary artery disease, CVA, nephropathy, peripheral neuropathy, chronic heavy smoking, sedentary life and she remains at a high risk for more acute and chronic complications which include CAD, CVA, CKD, retinopathy, and neuropathy. These are all discussed in detail with her.  - I have counseled her on diet  and weight management  by adopting a carbohydrate restricted/protein rich diet. Patient is encouraged to switch to  unprocessed or minimally processed     complex starch and increased protein intake (animal or plant source), fruits, and vegetables. -  she is advised to stick to a routine mealtimes to eat 3 meals  a day and avoid unnecessary snacks ( to snack only to correct hypoglycemia).   - she  admits there is a room for improvement in her diet and drink choices. -  Suggestion is made for her to avoid simple carbohydrates  from her diet including Cakes, Sweet Desserts / Pastries, Ice Cream, Soda (diet  and regular), Sweet Tea, Candies, Chips, Cookies, Sweet Pastries,  Store Bought Juices, Alcohol in Excess of  1-2 drinks a day, Artificial Sweeteners, Coffee Creamer, and "Sugar-free" Products. This will help patient to have stable blood glucose profile and potentially avoid unintended weight gain.   - she will be scheduled with Jearld Fenton, RDN, CDE for diabetes education.  - I have approached her with the following individualized plan to manage  her diabetes and patient agrees:   -Based on her glycemic response, she will not need insulin treatment  for now. - she is encouraged to continue monitoring blood glucose twice a day-daily before breakfast and at bedtime and report to clinic if she registers blood glucose readings less than 70 or greater than 200x3.  - she is advised to continue Metformin 500 mg p.o. twice daily-daily after breakfast and after supper.   -She did not tolerate glipizide.  I discussed and added Ozempic 0.5 mg subcutaneously weekly, samples were given to her.   - Specific targets for  A1c;  LDL, HDL, Triglycerides, and  Waist Circumference were discussed with the patient.  2) Blood Pressure /Hypertension: Her blood pressure is not controlled to target, improving from last visit.  she is advised to continue her current medications including lisinopril 20 mg p.o. daily with breakfast , as well as metoprolol 25 mg p.o. twice daily.   3) Lipids/Hyperlipidemia:   Review of her recent lipid panel showed uncontrolled  LDL at 102 .  she  is advised to continue Crestor 20 mg p.o. daily at bedtime.   Side effects and precautions discussed with her.  4)  Weight/Diet: Her BMI is 26.8-  she is  a candidate for some weight loss. I discussed with her the fact that loss of 5 - 10% of her  current body weight will have the most impact on her diabetes management.  Exercise, and detailed carbohydrates information provided  -  detailed on discharge instructions.  5) Chronic Care/Health Maintenance:  -she  is on ACEI/ARB and Statin medications and  is encouraged to initiate and continue to follow up with Ophthalmology, Dentist,  Podiatrist at least yearly or according to recommendations, and advised to  quit  smoking. I have recommended yearly flu vaccine and pneumonia vaccine at least every 5 years; moderate intensity exercise for up to 150 minutes weekly; and  sleep for at least 7 hours a day.  - she is  advised to maintain close follow up with Redmond School, MD for primary care needs, as well as her other providers for optimal and  coordinated care.  - Time spent on this patient care encounter:  35 min, of which > 50% was spent in  counseling and the rest reviewing her blood glucose logs , discussing her hypoglycemia and hyperglycemia episodes, reviewing her current and  previous labs / studies  ( including abstraction from other facilities) and medications  doses and developing a  long term treatment plan and documenting her care.   Please refer to Patient Instructions for Blood Glucose Monitoring and Insulin/Medications Dosing Guide"  in media tab for additional information. Please  also refer to " Patient Self Inventory" in the Media  tab for reviewed elements of pertinent patient history.  Shanon Ace participated in the discussions, expressed understanding, and voiced agreement with the above plans.  All questions were answered to her satisfaction. she is encouraged to contact clinic should she have any questions or concerns prior to her return visit.  Follow up  plan: - Return in about 4 months (around 08/23/2019) for Follow up with Pre-visit Labs, Bring Meter and Logs- A1c in Office.  Glade Lloyd, MD [facility] [address] [place], [place] [address] Phone: [phone]  Fax: [phone]    04/25/2019, 11:48 AM  This note was partially dictated with voice recognition software. Similar sounding words can be transcribed inadequately or may not  be corrected upon review.

## 2019-06-08 ENCOUNTER — Telehealth: Payer: Self-pay | Admitting: Cardiology

## 2019-06-08 NOTE — Telephone Encounter (Signed)
Returned pt call. She states that for the past day & half she has been having some numbness and pain in her left leg, which has a known blockage. She stated it started in her calf, and has moved down in her foot and and today is in her thigh area. She has no other symptoms. She has not traveled any recently where she has not been able to get up and move around, and she does move around a lot at home. She is concerned and would like to know what she should do. Please advise.

## 2019-06-08 NOTE — Telephone Encounter (Signed)
Pt called stating she has a blockage in her left leg and it's giving her some problems this week and it is starting to worry her.   Please call pt [phone]

## 2019-06-08 NOTE — Telephone Encounter (Signed)
She follows with [facility] with Dr Donnetta Hutching. We had done a cath for her heart and also did some picutres of the legs at that time, but since she is already established with vascular best plan would be to get a follow up with them to discuss her symptoms   Zandra Abts MD

## 2019-06-08 NOTE — Telephone Encounter (Signed)
Pt notified. She will follow up with [facility].

## 2019-06-21 ENCOUNTER — Other Ambulatory Visit: Payer: Self-pay | Admitting: *Deleted

## 2019-06-21 DIAGNOSIS — I739 Peripheral vascular disease, unspecified: Secondary | ICD-10-CM

## 2019-06-26 ENCOUNTER — Ambulatory Visit: Payer: 59

## 2019-06-26 ENCOUNTER — Inpatient Hospital Stay (HOSPITAL_COMMUNITY): Admission: RE | Admit: 2019-06-26 | Payer: 59 | Source: Ambulatory Visit

## 2019-07-03 ENCOUNTER — Telehealth (HOSPITAL_COMMUNITY): Payer: Self-pay

## 2019-07-03 ENCOUNTER — Ambulatory Visit: Payer: 59 | Admitting: Vascular Surgery

## 2019-07-03 ENCOUNTER — Encounter (HOSPITAL_COMMUNITY): Payer: 59

## 2019-07-03 NOTE — Telephone Encounter (Signed)

## 2019-07-04 ENCOUNTER — Other Ambulatory Visit: Payer: Self-pay

## 2019-07-04 ENCOUNTER — Ambulatory Visit (INDEPENDENT_AMBULATORY_CARE_PROVIDER_SITE_OTHER): Payer: 59 | Admitting: Physician Assistant

## 2019-07-04 ENCOUNTER — Ambulatory Visit (HOSPITAL_COMMUNITY)
Admission: RE | Admit: 2019-07-04 | Discharge: 2019-07-04 | Disposition: A | Discharge status: Home / Self Care | Payer: 59 | Source: Ambulatory Visit | Attending: Vascular Surgery | Admitting: Vascular Surgery

## 2019-07-04 ENCOUNTER — Other Ambulatory Visit (INDEPENDENT_AMBULATORY_CARE_PROVIDER_SITE_OTHER): Payer: Self-pay | Admitting: Internal Medicine

## 2019-07-04 VITALS — BP 114/61 | HR 79 | Temp 96.9°F | Resp 16 | Ht 66.0 in | Wt 152.0 lb

## 2019-07-04 DIAGNOSIS — F17200 Nicotine dependence, unspecified, uncomplicated: Secondary | ICD-10-CM

## 2019-07-04 DIAGNOSIS — I739 Peripheral vascular disease, unspecified: Secondary | ICD-10-CM

## 2019-07-04 DIAGNOSIS — I6529 Occlusion and stenosis of unspecified carotid artery: Secondary | ICD-10-CM | POA: Diagnosis not present

## 2019-07-04 NOTE — Progress Notes (Signed)
Established Intermittent Claudication   History of Present Illness   Grace Hess is a 64 y.o. (29-May-1955) female who presents with chief complaint: Numbness left leg.  Patient describes an episode of numbness in her left leg a couple weeks ago that lasted about 4 days but has since completely resolved.  She had diagnostic angiogram performed by cardiologist Dr. Saunders Revel in 07/2016 which demonstrated an occluded left SFA.  She does have claudication with burning pain and weakness in her calfs with ambulation however this does not affect her day-to-day life.  She also denies any rest pain or nonhealing wounds of her lower extremities.  Surgical history also significant for left carotid endarterectomy by Dr. Donnetta Hutching in September 2016.  Carotid duplex in August 2019 demonstrated a patent surgical site however right ICA demonstrated 60 to 79% stenosis with velocities on the upper end.  She denies any vision changes or slurring speech.  She is taking an aspirin and Plavix daily.  She is an everyday tobacco smoker.  Patient also notes degenerative disc disease of her lumbar spine.   The patient's PMH, PSH, SH, and FamHx were reviewed and are unchanged from prior visit.  Current Outpatient Medications  Medication Sig Dispense Refill  . acetaminophen (TYLENOL) 325 MG tablet Take 2 tablets (650 mg total) by mouth every 4 (four) hours as needed for headache or mild pain.    Marland Kitchen ALPRAZolam (XANAX) 0.5 MG tablet Take 0.5 mg by mouth 2 (two) times daily as needed.    Marland Kitchen aspirin EC 81 MG tablet Take 1 tablet (81 mg total) by mouth daily. Please continue Aspirin and plavix for 6 months, then plavix daily 30 tablet 6  . clopidogrel (PLAVIX) 75 MG tablet Take 1 tablet (75 mg total) by mouth daily. 30 tablet 3  . dicyclomine (BENTYL) 10 MG capsule Take 1 capsule (10 mg total) by mouth 2 (two) times daily. 60 capsule 5  . famotidine (PEPCID) 20 MG tablet Take 20 mg by mouth daily.    Marland Kitchen gabapentin (NEURONTIN) 300 MG  capsule Take 300 mg by mouth 2 (two) times daily.     Marland Kitchen LEXAPRO 10 MG tablet Take 1 tablet by mouth daily.    Marland Kitchen lisinopril (ZESTRIL) 20 MG tablet Take 1 tablet (20 mg total) by mouth daily. 90 tablet 3  . metFORMIN (GLUCOPHAGE) 500 MG tablet Take 1 tablet (500 mg total) by mouth 2 (two) times daily with a meal. 60 tablet 2  . metoprolol tartrate (LOPRESSOR) 25 MG tablet Take 0.5 tablets (12.5 mg total) by mouth 2 (two) times daily. 90 tablet 3  . nicotine (NICODERM CQ) 21 mg/24hr patch USE 1- 21 MG PATCH DAILY FOR 6 WEEKS, THEN DECREASE TO 14 MG DAILY FOR 2 WEEKS, THEN DECREASE TO 7 MG DAILY FOR 2 WEEKS. 70 patch 0  . nitroGLYCERIN (NITROSTAT) 0.4 MG SL tablet Place 1 tablet (0.4 mg total) under the tongue every 5 (five) minutes as needed for chest pain. 25 tablet 3  . nystatin cream (MYCOSTATIN) Apply 1 application topically 2 (two) times daily as needed.  5  . ondansetron (ZOFRAN) 4 MG tablet TAKE 1 TABLET(4 MG) BY MOUTH TWICE DAILY AS NEEDED FOR NAUSEA OR VOMITING 20 tablet 1  . oxyCODONE (OXY IR/ROXICODONE) 5 MG immediate release tablet Take 1 tablet by mouth every 4 (four) hours as needed for moderate pain.   0  . Semaglutide,0.25 or 0.5MG /DOS, 2 MG/1.5ML SOPN Inject 0.5 mg into the skin once a week.    Marland Kitchen  rosuvastatin (CRESTOR) 20 MG tablet Take 1 tablet (20 mg total) by mouth daily. 90 tablet 3   No current facility-administered medications for this visit.    REVIEW OF SYSTEMS (negative unless checked):   Cardiac:  []  Chest pain or chest pressure? []  Shortness of breath upon activity? []  Shortness of breath when lying flat? []  Irregular heart rhythm?  Vascular:  []  Pain in calf, thigh, or hip brought on by walking? []  Pain in feet at night that wakes you up from your sleep? []  Blood clot in your veins? []  Leg swelling?  Pulmonary:  []  Oxygen at home? []  Productive cough? []  Wheezing?  Neurologic:  []  Sudden weakness in arms or legs? []  Sudden numbness in arms or legs? []   Sudden onset of difficult speaking or slurred speech? []  Temporary loss of vision in one eye? []  Problems with dizziness?  Gastrointestinal:  []  Blood in stool? []  Vomited blood?  Genitourinary:  []  Burning when urinating? []  Blood in urine?  Psychiatric:  []  Major depression  Hematologic:  []  Bleeding problems? []  Problems with blood clotting?  Dermatologic:  []  Rashes or ulcers?  Constitutional:  []  Fever or chills?  Ear/Nose/Throat:  []  Change in hearing? []  Nose bleeds? []  Sore throat?  Musculoskeletal:  [x]  Back pain? []  Joint pain? []  Muscle pain?   Physical Examination   Vitals:   07/04/19 1412  BP: 114/61  Pulse: 79  Resp: 16  Temp: (!) 96.9 F (36.1 C)  SpO2: 95%  Weight: 152 lb (68.9 kg)  Height: 5\' 6"  (1.676 m)   Body mass index is 24.53 kg/m.  General:  WDWN in NAD; vital signs documented above Gait: Not observed HENT: WNL, normocephalic Pulmonary: normal non-labored breathing , without Rales, rhonchi,  wheezing Cardiac: regular HR Abdomen: soft, NT, no masses Skin: without rashes Vascular Exam/Pulses:  Right Left  Radial 2+ (normal) 2+ (normal)  Femoral 2+ (normal) 2+ (normal)  Popliteal absent absent  DP 2+ (normal) absent  PT 1+ (weak) absent   Extremities: without ischemic changes, without Gangrene , without cellulitis; without open wounds;  Musculoskeletal: no muscle wasting or atrophy  Neurologic: A&O X 3;  No focal weakness or paresthesias are detected; CN grossly intact Psychiatric:  The pt has Normal affect.  Non-Invasive Vascular imaging   ABI   R:   ABI: 0.97 ,   PT: bi  DP: bi  TBI:  0.75  L:   ABI: 0.83 ,   PT: bi  DP: bi  TBI: 0.57   Medical Decision Making   Grace Hess is a 64 y.o. female who presents with complaint of episode of numbness of LLE that lasted 4 days   Patient has a known left SFA occlusion noted on angiography in May 2018  Patient does have claudication with burning  pain in both calfs with ambulation however this does not impact her quality of life; she is also without rest pain or nonhealing wounds of bilateral lower extremities  Patient was lost to follow-up after carotid duplex scan in August 2019 demonstrating right ICA with 60 to 79% stenosis with velocities on the upper end  With an episode of left lower extremity numbness, the concern would be for neurologic event related to right ICA; offered patient carotid duplex today however patient has a prior commitment; we will check a carotid duplex in the next 1 to 2 weeks  Patient also has history of degenerative disc disease of lumbar spine; this could also explain her episode  of numbness in her left lower extremity  We will proceed conservatively with PAD; recommended a walking program  ABIs can be checked again in a year  Encouraged smoking cessation  Continue Plavix, aspirin, statin  We will see her back in the next week or 2 with carotid duplex   Dagoberto Ligas PA-C [facility] Office: [phone]  Clinic MD: Oneida Alar

## 2019-07-05 ENCOUNTER — Other Ambulatory Visit: Payer: Self-pay | Admitting: *Deleted

## 2019-07-05 DIAGNOSIS — I6529 Occlusion and stenosis of unspecified carotid artery: Secondary | ICD-10-CM

## 2019-07-17 ENCOUNTER — Encounter (INDEPENDENT_AMBULATORY_CARE_PROVIDER_SITE_OTHER): Payer: Self-pay | Admitting: Internal Medicine

## 2019-07-17 ENCOUNTER — Other Ambulatory Visit: Payer: Self-pay

## 2019-07-17 ENCOUNTER — Ambulatory Visit (INDEPENDENT_AMBULATORY_CARE_PROVIDER_SITE_OTHER): Payer: 59 | Admitting: Internal Medicine

## 2019-07-17 VITALS — BP 157/84 | HR 97 | Temp 97.1°F | Ht 66.0 in | Wt 150.1 lb

## 2019-07-17 DIAGNOSIS — K219 Gastro-esophageal reflux disease without esophagitis: Secondary | ICD-10-CM | POA: Diagnosis not present

## 2019-07-17 DIAGNOSIS — R634 Abnormal weight loss: Secondary | ICD-10-CM | POA: Insufficient documentation

## 2019-07-17 DIAGNOSIS — K580 Irritable bowel syndrome with diarrhea: Secondary | ICD-10-CM | POA: Diagnosis not present

## 2019-07-17 MED ORDER — PANTOPRAZOLE SODIUM 40 MG PO TBEC: 40.0000 mg | DELAYED_RELEASE_TABLET | Freq: Every day | ORAL | 5 refills | Status: DC

## 2019-07-17 NOTE — Patient Instructions (Addendum)
Please call office with progress report in 4 weeks or earlier if pantoprazole does not work or  if you experience side effects. Weight check in 8 weeks.

## 2019-07-17 NOTE — Progress Notes (Signed)
Presenting complaint;  Follow-up for GERD and diarrhea/IBS.  Database and subjective:  Patient is 64 year old Caucasian female who has a history of GERD and chronic diarrhea felt to be due to IBS who is here for scheduled visit.  She was last seen in November 2020. Patient states she is doing well.  However she complains of having heartburn every day.  She says it is not bad.  She has some nausea no vomiting.  He uses Alka-Seltzer once or twice a week.  She does not know that he has aspirin in it.  She remains with frequent bowel movements.  Her averages 3 to 4/day.  Stool frequency ranges between 2 and 8.  She states she tries to avoid foods which trigger bowel movements such as tomatoes and spicy food.  She says last Saturday she had 8 bowel movements.  She has not been constipated lately.  She says more than 50% of her stools are loose.  She is having 1-2 accidents every month.  She is using depends.  She may also have sporadic accident at nights.  She says her appetite is fair. She has lost 16 pounds in the last 6 months.  She says she actually has lost 23 pounds since her weight had increased to 173 pounds.  Weight loss started when she was begun on Ozempic for diabetes.  She says Dr. Dorris Fetch up to this point is not concerned about her weight loss. She has chronic low back pain and takes pain medication 3 times a week. She is presently taking famotidine for heartburn.  She was given prescription for lansoprazole which she stopped.  She states it did not agree with her but she does not remember what side effects she had. Her last colonoscopy was in August 2020.  She would like to wait until next year for the next exam.  Current Medications: Outpatient Encounter Medications as of 07/17/2019  Medication Sig  . acetaminophen (TYLENOL) 325 MG tablet Take 2 tablets (650 mg total) by mouth every 4 (four) hours as needed for headache or mild pain.  Marland Kitchen ALPRAZolam (XANAX) 0.5 MG tablet Take 0.5 mg by mouth 2  (two) times daily as needed.  Marland Kitchen aspirin EC 81 MG tablet Take 1 tablet (81 mg total) by mouth daily. Please continue Aspirin and plavix for 6 months, then plavix daily  . clopidogrel (PLAVIX) 75 MG tablet Take 1 tablet (75 mg total) by mouth daily.  Marland Kitchen dicyclomine (BENTYL) 10 MG capsule Take 1 capsule (10 mg total) by mouth 2 (two) times daily.  . famotidine (PEPCID) 20 MG tablet Take 10 mg by mouth daily.   Marland Kitchen gabapentin (NEURONTIN) 300 MG capsule Take 300 mg by mouth 2 (two) times daily.   Marland Kitchen LEXAPRO 10 MG tablet Take 20 mg by mouth daily.   Marland Kitchen lisinopril (ZESTRIL) 20 MG tablet Take 1 tablet (20 mg total) by mouth daily.  . metFORMIN (GLUCOPHAGE) 500 MG tablet Take 1 tablet (500 mg total) by mouth 2 (two) times daily with a meal. (Patient taking differently: Take 1,000 mg by mouth 2 (two) times daily with a meal. )  . metoprolol tartrate (LOPRESSOR) 25 MG tablet Take 0.5 tablets (12.5 mg total) by mouth 2 (two) times daily.  . nicotine (NICODERM CQ) 21 mg/24hr patch USE 1- 21 MG PATCH DAILY FOR 6 WEEKS, THEN DECREASE TO 14 MG DAILY FOR 2 WEEKS, THEN DECREASE TO 7 MG DAILY FOR 2 WEEKS.  . nitroGLYCERIN (NITROSTAT) 0.4 MG SL tablet Place 1 tablet (  0.4 mg total) under the tongue every 5 (five) minutes as needed for chest pain.  Marland Kitchen nystatin cream (MYCOSTATIN) Apply 1 application topically 2 (two) times daily as needed.  . ondansetron (ZOFRAN) 4 MG tablet TAKE 1 TABLET(4 MG) BY MOUTH TWICE DAILY AS NEEDED FOR NAUSEA OR VOMITING  . oxyCODONE-acetaminophen (PERCOCET) 10-325 MG tablet Take 1 tablet by mouth 3 (three) times daily as needed for pain.  . Semaglutide,0.25 or 0.5MG /DOS, 2 MG/1.5ML SOPN Inject 0.5 mg into the skin once a week.  . rosuvastatin (CRESTOR) 20 MG tablet Take 1 tablet (20 mg total) by mouth daily.  . [DISCONTINUED] oxyCODONE (OXY IR/ROXICODONE) 5 MG immediate release tablet Take 1 tablet by mouth every 4 (four) hours as needed for moderate pain.    No facility-administered encounter  medications on file as of 07/17/2019.     Objective: Blood pressure (!) 157/84, pulse 97, temperature (!) 97.1 F (36.2 C), temperature source Temporal, height 5\' 6"  (1.676 m), weight 150 lb 1.6 oz (68.1 kg). Patient is alert and in no acute distress. She is wearing facial mask. Conjunctiva is pink. Sclera is nonicteric Oropharyngeal mucosa is normal. No neck masses or thyromegaly noted. Cardiac exam with regular rhythm normal S1 and S2.  She has faint systolic murmur best heard at aortic area. Lungs are clear to auscultation. Abdomen is symmetrical with a right subcostal scar.  On palpation abdomen is soft and nontender with organomegaly or masses. No LE edema or clubbing noted.  Assessment:  #1.  GERD.  She is having daily heartburn.  Heartburn is not well controlled with famotidine.  She could not tolerate lansoprazole which she does not remember the side effects she experienced.  We will try her on other PPI.  #2.  Chronic diarrhea felt to be due to IBS.  She is better with dicyclomine but I would consider response to be unsatisfactory.  Need to rule out collagenous or microscopic colitis.  #3.  Weight loss appears to be secondary to Ozempic/semaglutide.  Would like to see her weight level off.   Plan:  Discontinue famotidine as it is not working. Continue dicyclomine at current dose. Pantoprazole 40 mg by mouth 30 minutes before breakfast daily. Patient will call with progress report in 4 weeks read GERD symptoms. Weight check in 2 months. Office visit in 6 months. She will undergo colonoscopy primarily for screening purposes next year when she is ready.

## 2019-07-19 ENCOUNTER — Ambulatory Visit: Payer: 59

## 2019-07-19 ENCOUNTER — Encounter (HOSPITAL_COMMUNITY): Payer: 59

## 2019-08-16 ENCOUNTER — Other Ambulatory Visit: Payer: Self-pay

## 2019-08-16 DIAGNOSIS — E1159 Type 2 diabetes mellitus with other circulatory complications: Secondary | ICD-10-CM

## 2019-08-21 LAB — COMPLETE METABOLIC PANEL WITH GFR
AG Ratio: 1.8 (calc) (ref 1.0–2.5)
ALT: 12 U/L (ref 6–29)
AST: 13 U/L (ref 10–35)
Albumin: 4.6 g/dL (ref 3.6–5.1)
Alkaline phosphatase (APISO): 52 U/L (ref 37–153)
BUN/Creatinine Ratio: 25 (calc) — ABNORMAL HIGH (ref 6–22)
BUN: 35 mg/dL — ABNORMAL HIGH (ref 7–25)
CO2: 23 mmol/L (ref 20–32)
Calcium: 9.8 mg/dL (ref 8.6–10.4)
Chloride: 108 mmol/L (ref 98–110)
Creat: 1.38 mg/dL — ABNORMAL HIGH (ref 0.50–0.99)
GFR, Est African American: 47 mL/min/{1.73_m2} — ABNORMAL LOW (ref >=60)
GFR, Est Non African American: 40 mL/min/{1.73_m2} — ABNORMAL LOW (ref >=60)
Globulin: 2.5 g/dL (calc) (ref 1.9–3.7)
Glucose, Bld: 146 mg/dL — ABNORMAL HIGH (ref 65–139)
Potassium: 4.6 mmol/L (ref 3.5–5.3)
Sodium: 138 mmol/L (ref 135–146)
Total Bilirubin: 0.4 mg/dL (ref 0.2–1.2)
Total Protein: 7.1 g/dL (ref 6.1–8.1)

## 2019-08-21 LAB — VITAMIN D 25 HYDROXY (VIT D DEFICIENCY, FRACTURES): Vit D, 25-Hydroxy: 11 ng/mL — ABNORMAL LOW (ref 30–100)

## 2019-08-21 LAB — TSH: TSH: 1.1 mIU/L (ref 0.40–4.50)

## 2019-08-21 LAB — T4, FREE: Free T4: 1.4 ng/dL (ref 0.8–1.8)

## 2019-08-21 LAB — MICROALBUMIN / CREATININE URINE RATIO
Creatinine, Urine: 147 mg/dL (ref 20–275)
Microalb Creat Ratio: 182 mcg/mg creat — ABNORMAL HIGH (ref <30)
Microalb, Ur: 26.7 mg/dL

## 2019-08-23 ENCOUNTER — Ambulatory Visit (INDEPENDENT_AMBULATORY_CARE_PROVIDER_SITE_OTHER): Payer: 59 | Admitting: "Endocrinology

## 2019-08-23 ENCOUNTER — Other Ambulatory Visit: Payer: Self-pay

## 2019-08-23 ENCOUNTER — Encounter: Payer: Self-pay | Admitting: "Endocrinology

## 2019-08-23 VITALS — BP 115/67 | HR 66 | Ht 66.0 in | Wt 146.8 lb

## 2019-08-23 DIAGNOSIS — E559 Vitamin D deficiency, unspecified: Secondary | ICD-10-CM | POA: Diagnosis not present

## 2019-08-23 DIAGNOSIS — E782 Mixed hyperlipidemia: Secondary | ICD-10-CM | POA: Diagnosis not present

## 2019-08-23 DIAGNOSIS — I10 Essential (primary) hypertension: Secondary | ICD-10-CM

## 2019-08-23 DIAGNOSIS — E1159 Type 2 diabetes mellitus with other circulatory complications: Secondary | ICD-10-CM

## 2019-08-23 LAB — POCT GLYCOSYLATED HEMOGLOBIN (HGB A1C): Hemoglobin A1C: 7.3 % — AB (ref 4.0–5.6)

## 2019-08-23 MED ORDER — METFORMIN HCL 500 MG PO TABS: 500.0000 mg | ORAL_TABLET | Freq: Two times a day (BID) | ORAL | 1 refills | Status: DC

## 2019-08-23 MED ORDER — VITAMIN D (ERGOCALCIFEROL) 1.25 MG (50000 UNIT) PO CAPS: 50000.0000 [IU] | ORAL_CAPSULE | ORAL | 0 refills | Status: DC

## 2019-08-23 MED ORDER — ACCU-CHEK GUIDE VI STRP: ORAL_STRIP | 2 refills | Status: AC

## 2019-08-23 MED ORDER — ACCU-CHEK GUIDE W/DEVICE KIT: 1.0000 | PACK | 0 refills | Status: DC

## 2019-08-23 NOTE — Patient Instructions (Signed)

## 2019-08-23 NOTE — Progress Notes (Signed)
08/23/2019, 11:07 AM                                Endocrinology follow-up note   Subjective:    Patient ID: Grace Hess, female    DOB: [birth year].  Grace Hess is being seen in follow-up for management of currently uncontrolled symptomatic diabetes requested by  Redmond School, MD.   Past Medical History:  Diagnosis Date  . Anxiety   . Arthritis    "might have a touch in my fingers" (08/02/2016)  . Cervicalgia   . Chest pain, unspecified   . GERD (gastroesophageal reflux disease)   . Headache    "bad ones after my stroke in 2016; only have them when I get bad news now" (08/02/2016)  . Heart murmur   . Heavy smoker (more than 20 cigarettes per day)    Scheduled for LDCT screening 02/11/15  . Hepatitis A    "related to [employer]"  . History of hiatal hernia   . History of kidney stones    "no cysto/OR" (12/10/2014)  . Hypercholesteremia   . Hypertension   . Obesity   . Reflux esophagitis   . Stroke [facility])    "they said I had a silent stroke a few months back/MRI" (12/10/2014)  . TIA (transient ischemic attack) 11/28/2014   [name] 11/28/2014  . Type II diabetes mellitus ([place])     Past Surgical History:  Procedure Laterality Date  . ABDOMINAL AORTOGRAM N/A 08/02/2016   Procedure: Abdominal Aortogram;  Surgeon: Nelva Bush, MD;  Location: [place] [facility];  Service: Cardiovascular;  Laterality: N/A;  . CAROTID STENT    . CHOLECYSTECTOMY OPEN  1978  . CORONARY ANGIOPLASTY WITH STENT PLACEMENT  08/02/2016   to RCA  . CORONARY STENT INTERVENTION N/A 08/02/2016   Procedure: Coronary Stent Intervention;  Surgeon: Nelva Bush, MD;  Location: [place] [facility];  Service: Cardiovascular;  Laterality: N/A;  RCA  . ENDARTERECTOMY Left 12/02/2014   Procedure: ENDARTERECTOMY CAROTID;  Surgeon: Rosetta Posner, MD;  Location: [place];  Service: Vascular;  Laterality: Left;  . LEFT HEART  CATH AND CORONARY ANGIOGRAPHY N/A 08/02/2016   Procedure: Left Heart Cath and Coronary Angiography;  Surgeon: Nelva Bush, MD;  Location: [place] [facility];  Service: Cardiovascular;  Laterality: N/A;  . LOWER EXTREMITY ANGIOGRAM  08/02/2016  . LOWER EXTREMITY ANGIOGRAPHY N/A 08/02/2016   Procedure: Lower Extremity Angiography;  Surgeon: Nelva Bush, MD;  Location: [place] [facility];  Service: Cardiovascular;  Laterality: N/A;  . VAGINAL HYSTERECTOMY  1991   "partial"    Social History   Socioeconomic History  . Marital status: Married    Spouse name: Not on file  . Number of children: 2  . Years of education: 34  . Highest education level: Not on file  Occupational History  . Not on file  Tobacco Use  . Smoking status: Current Every Day Smoker    Packs/day: 2.00    Years: 43.00    Pack years: 86.00  Types: Cigarettes    Start date: 05/17/1974  . Smokeless tobacco: Never Used  Vaping Use  . Vaping Use: Never used  Substance and Sexual Activity  . Alcohol use: No    Alcohol/week: 0.0 standard drinks  . Drug use: No  . Sexual activity: Never  Other Topics Concern  . Not on file  Social History Narrative   Patient does not drink caffeine.   Patient is right handed.    Social Determinants of Health   Financial Resource Strain:   . Difficulty of Paying Living Expenses:   Food Insecurity:   . Worried About Charity fundraiser in the Last Year:   . Arboriculturist in the Last Year:   Transportation Needs:   . Film/video editor (Medical):   Marland Kitchen Lack of Transportation (Non-Medical):   Physical Activity:   . Days of Exercise per Week:   . Minutes of Exercise per Session:   Stress:   . Feeling of Stress :   Social Connections:   . Frequency of Communication with Friends and Family:   . Frequency of Social Gatherings with Friends and Family:   . Attends Religious Services:   . Active Member of Clubs or Organizations:   . Attends Archivist  Meetings:   Marland Kitchen Marital Status:     Family History  Problem Relation Age of Onset  . Congestive Heart Failure Mother   . COPD Father   . Cancer Brother   . Cancer Brother     Outpatient Encounter Medications as of 08/23/2019  Medication Sig  . acetaminophen (TYLENOL) 325 MG tablet Take 2 tablets (650 mg total) by mouth every 4 (four) hours as needed for headache or mild pain.  Marland Kitchen ALPRAZolam (XANAX) 0.5 MG tablet Take 0.5 mg by mouth 2 (two) times daily as needed.  Marland Kitchen aspirin EC 81 MG tablet Take 1 tablet (81 mg total) by mouth daily. Please continue Aspirin and plavix for 6 months, then plavix daily  . Blood Glucose Monitoring Suppl (ACCU-CHEK GUIDE) w/Device KIT 1 Piece by Does not apply route as directed.  . clopidogrel (PLAVIX) 75 MG tablet Take 1 tablet (75 mg total) by mouth daily.  Marland Kitchen dicyclomine (BENTYL) 10 MG capsule Take 1 capsule (10 mg total) by mouth 2 (two) times daily.  Marland Kitchen gabapentin (NEURONTIN) 300 MG capsule Take 300 mg by mouth 2 (two) times daily.   Marland Kitchen glucose blood (ACCU-CHEK GUIDE) test strip Use as instructed  . LEXAPRO 10 MG tablet Take 20 mg by mouth daily.   Marland Kitchen lisinopril (ZESTRIL) 20 MG tablet Take 1 tablet (20 mg total) by mouth daily.  . metFORMIN (GLUCOPHAGE) 500 MG tablet Take 1 tablet (500 mg total) by mouth 2 (two) times daily with a meal.  . metoprolol tartrate (LOPRESSOR) 25 MG tablet Take 0.5 tablets (12.5 mg total) by mouth 2 (two) times daily.  . nicotine (NICODERM CQ) 21 mg/24hr patch USE 1- 21 MG PATCH DAILY FOR 6 WEEKS, THEN DECREASE TO 14 MG DAILY FOR 2 WEEKS, THEN DECREASE TO 7 MG DAILY FOR 2 WEEKS.  . nitroGLYCERIN (NITROSTAT) 0.4 MG SL tablet Place 1 tablet (0.4 mg total) under the tongue every 5 (five) minutes as needed for chest pain.  Marland Kitchen nystatin cream (MYCOSTATIN) Apply 1 application topically 2 (two) times daily as needed.  . ondansetron (ZOFRAN) 4 MG tablet TAKE 1 TABLET(4 MG) BY MOUTH TWICE DAILY AS NEEDED FOR NAUSEA OR VOMITING  .  oxyCODONE-acetaminophen (PERCOCET/ROXICET) 5-325 MG  tablet Take 1 tablet by mouth 3 (three) times daily as needed.  . pantoprazole (PROTONIX) 40 MG tablet Take 1 tablet (40 mg total) by mouth daily before breakfast.  . rosuvastatin (CRESTOR) 20 MG tablet Take 1 tablet (20 mg total) by mouth daily.  . Semaglutide,0.25 or 0.5MG/DOS, 2 MG/1.5ML SOPN Inject 0.5 mg into the skin once a week.  . Vitamin D, Ergocalciferol, (DRISDOL) 1.25 MG (50000 UNIT) CAPS capsule Take 1 capsule (50,000 Units total) by mouth every 7 (seven) days.  . [DISCONTINUED] metFORMIN (GLUCOPHAGE) 500 MG tablet Take 1 tablet (500 mg total) by mouth 2 (two) times daily with a meal. (Patient taking differently: Take 1,000 mg by mouth 2 (two) times daily with a meal. )  . [DISCONTINUED] oxyCODONE-acetaminophen (PERCOCET) 10-325 MG tablet Take 1 tablet by mouth 3 (three) times daily as needed for pain.   No facility-administered encounter medications on file as of 08/23/2019.    ALLERGIES: Allergies  Allergen Reactions  . Codeine Shortness Of Breath  . Latex Itching  . Sulfa Antibiotics Other (See Comments)    Reaction:  Unknown     VACCINATION STATUS: Immunization History  Administered Date(s) Administered  . Influenza,inj,Quad PF,6+ Mos 11/29/2014  . Pneumococcal Polysaccharide-23 11/29/2014    Diabetes She presents for her follow-up diabetic visit. She has type 2 diabetes mellitus. Onset time: She was diagnosed at approximate age of 60 years. Her disease course has been worsening. There are no hypoglycemic associated symptoms. Pertinent negatives for hypoglycemia include no confusion, headaches, pallor or seizures. Associated symptoms include fatigue and foot paresthesias. Pertinent negatives for diabetes include no chest pain, no polydipsia, no polyphagia and no polyuria. There are no hypoglycemic complications. Symptoms are worsening. Diabetic complications include a CVA, heart disease and nephropathy. Risk factors for  coronary artery disease include dyslipidemia, diabetes mellitus, family history, hypertension, tobacco exposure, sedentary lifestyle and post-menopausal. Current diabetic treatments: She is currently on Metformin 500 mg twice daily, Ozempic 0.5 mg subcutaneously weekly only samples, her insurance did not provide coverage from [place].  She did not tolerate glipizide which she stopped.   Her weight is decreasing steadily. She is following a generally unhealthy diet. When asked about meal planning, she reported none. She has not had a previous visit with a dietitian. She never participates in exercise. Her home blood glucose trend is decreasing steadily. Her breakfast blood glucose range is generally 130-140 mg/dl. Her overall blood glucose range is 130-140 mg/dl. (She presents with slightly above target glycemic profile, point-of-care A1c 7.3% today increasing from 6.9%.  She denies hypoglycemia.   ) An Hess inhibitor/angiotensin II receptor blocker is being taken. Eye exam is current.  Hyperlipidemia This is a chronic problem. The current episode started more than 1 year ago. Exacerbating diseases include diabetes. Pertinent negatives include no chest pain, myalgias or shortness of breath. Current antihyperlipidemic treatment includes statins. Risk factors for coronary artery disease include diabetes mellitus, dyslipidemia, hypertension, a sedentary lifestyle, post-menopausal and family history.  Hypertension This is a chronic problem. The problem is controlled. Pertinent negatives include no chest pain, headaches, palpitations or shortness of breath. Risk factors for coronary artery disease include dyslipidemia, diabetes mellitus, sedentary lifestyle, smoking/tobacco exposure and post-menopausal state. Past treatments include Hess inhibitors. Hypertensive end-organ damage includes kidney disease, CAD/MI and CVA.     Review of systems  Constitutional: + Lost approximately 20 pounds overall,   current  Body  mass index is 23.69 kg/m. , no fatigue, no subjective hyperthermia, no subjective hypothermia Eyes: no  blurry vision, no xerophthalmia ENT: no sore throat, no nodules palpated in throat, no dysphagia/odynophagia, no hoarseness Cardiovascular: no Chest Pain, no Shortness of Breath, no palpitations, no leg swelling Respiratory: no cough, no shortness of breath Gastrointestinal: no Nausea/Vomiting/Diarhhea Musculoskeletal: no muscle/joint aches Skin: no rashes, no hyperemia Neurological: no tremors, no numbness, no tingling, no dizziness Psychiatric: no depression, no anxiety   Objective:    BP 115/67   Pulse 66   Ht '5\' 6"'  (1.676 m)   Wt 146 lb 12.8 oz (66.6 kg)   BMI 23.69 kg/m   Wt Readings from Last 3 Encounters:  08/23/19 146 lb 12.8 oz (66.6 kg)  07/17/19 150 lb 1.6 oz (68.1 kg)  07/04/19 152 lb (68.9 kg)      Physical Exam- Limited  Constitutional:  Body mass index is 23.69 kg/m. , not in acute distress, normal state of mind Eyes:  EOMI, no exophthalmos Neck: Supple Thyroid: No gross goiter Respiratory: Adequate breathing efforts Musculoskeletal: no gross deformities, strength intact in all four extremities, no gross restriction of joint movements Skin:  no rashes, no hyperemia Neurological: no tremor with outstretched hands,    CMP     Component Value Date/Time   NA 138 08/20/2019 0719   K 4.6 08/20/2019 0719   CL 108 08/20/2019 0719   CO2 23 08/20/2019 0719   GLUCOSE 146 (H) 08/20/2019 0719   BUN 35 (H) 08/20/2019 0719   CREATININE 1.38 (H) 08/20/2019 0719   CALCIUM 9.8 08/20/2019 0719   PROT 7.1 08/20/2019 0719   ALBUMIN 4.2 09/14/2015 2013   AST 13 08/20/2019 0719   ALT 12 08/20/2019 0719   ALKPHOS 58 09/14/2015 2013   BILITOT 0.4 08/20/2019 0719   GFRNONAA 40 (L) 08/20/2019 0719   GFRAA 47 (L) 08/20/2019 0719     Diabetic Labs (most recent): Lab Results  Component Value Date   HGBA1C 7.3 (A) 08/23/2019   HGBA1C 6.9 (A) 04/25/2019   HGBA1C  7.9 (H) 01/16/2019    Lipid Panel     Component Value Date/Time   CHOL 143 01/16/2019 0937   TRIG 244 (H) 01/16/2019 0937   HDL 40 (L) 01/16/2019 0937   CHOLHDL 3.6 01/16/2019 0937   VLDL UNABLE TO CALCULATE IF TRIGLYCERIDE OVER 400 mg/dL 11/29/2014 0630   LDLCALC 69 01/16/2019 0937       Assessment & Plan:   1. DM type 2 causing vascular disease ([place])  - Grace Hess has currently uncontrolled symptomatic type 2 DM since  64 years of age.  She presents with slightly above target glycemic profile, point-of-care A1c 7.3% today increasing from 6.9%.  She denies hypoglycemia.    - I had a long discussion with her about the progressive nature of diabetes and the pathology behind its complications. -her diabetes is complicated by coronary artery disease, CVA, nephropathy, peripheral neuropathy, chronic heavy smoking, sedentary life and she remains at a high risk for more acute and chronic complications which include CAD, CVA, CKD, retinopathy, and neuropathy. These are all discussed in detail with her.  - I have counseled her on diet  and weight management  by adopting a carbohydrate restricted/protein rich diet. Patient is encouraged to switch to  unprocessed or minimally processed     complex starch and increased protein intake (animal or plant source), fruits, and vegetables. -  she is advised to stick to a routine mealtimes to eat 3 meals  a day and avoid unnecessary snacks ( to snack only to correct hypoglycemia).    -  she  admits there is a room for improvement in her diet and drink choices. -  Suggestion is made for her to avoid simple carbohydrates  from her diet including Cakes, Sweet Desserts / Pastries, Ice Cream, Soda (diet and regular), Sweet Tea, Candies, Chips, Cookies, Sweet Pastries,  Store Bought Juices, Alcohol in Excess of  1-2 drinks a day, Artificial Sweeteners, Coffee Creamer, and "Sugar-free" Products. This will help patient to have stable blood glucose profile  and potentially avoid unintended weight gain.    - she will be scheduled with Grace Hess, RDN, CDE for diabetes education.  - I have approached her with the following individualized plan to manage  her diabetes and patient agrees:   -Based on her glycemic response, she will not need insulin treatment for now. - she is encouraged to continue monitoring blood glucose twice a day-daily before breakfast and at bedtime and report to clinic if she registers blood glucose readings less than 70 or greater than 200x3.  - she is advised to continue Metformin at lower dose of 500 mg p.o. twice daily-daily after breakfast and after supper. -She received more samples of Ozempic pens from clinic, to use 0.5 mg subcutaneously weekly.    -She did not tolerate glipizide.   - Specific targets for  A1c;  LDL, HDL, Triglycerides, and  Waist Circumference were discussed with the patient.  2) Blood Pressure /Hypertension: .  Her blood pressure is controlled to target.  Improving from last visit.  she is advised to continue her current medications including lisinopril 20 mg p.o. daily with breakfast , as well as metoprolol 25 mg p.o. twice daily.   3) Lipids/Hyperlipidemia:   Review of her recent lipid panel showed improved LDL at 69 from 102.    she  is advised to continue Crestor 20 mg p.o. daily at bedtime.   Side effects and precautions discussed with her.  4)  Weight/Diet: Her BMI is 23.6--she has lost significant amount of weight recently.  she is not a candidate for major weight loss.   Exercise, and detailed carbohydrates information provided  -  detailed on discharge instructions.  5) Chronic Care/Health Maintenance:  -she  is on ACEI/ARB and Statin medications and  is encouraged to initiate and continue to follow up with Ophthalmology, Dentist,  Podiatrist at least yearly or according to recommendations, and advised to  quit  smoking. I have recommended yearly flu vaccine and pneumonia vaccine at  least every 5 years; moderate intensity exercise for up to 150 minutes weekly; and  sleep for at least 7 hours a day.  - she is  advised to maintain close follow up with Redmond School, MD for primary care needs, as well as her other providers for optimal and coordinated care.  - Time spent on this patient care encounter:  35 min, of which > 50% was spent in  counseling and the rest reviewing her blood glucose logs , discussing her hypoglycemia and hyperglycemia episodes, reviewing her current and  previous labs / studies  ( including abstraction from other facilities) and medications  doses and developing a  long term treatment plan and documenting her care.   Please refer to Patient Instructions for Blood Glucose Monitoring and Insulin/Medications Dosing Guide"  in media tab for additional information. Please  also refer to " Patient Self Inventory" in the Media  tab for reviewed elements of pertinent patient history.  Grace Hess participated in the discussions, expressed understanding, and voiced agreement with the  above plans.  All questions were answered to her satisfaction. she is encouraged to contact clinic should she have any questions or concerns prior to her return visit.   Follow up plan: - Return in about 4 months (around 12/23/2019) for Bring Meter and Logs- A1c in Office.  Glade Lloyd, MD [facility] [address] [place], [place] [address] Phone: [phone]  Fax: [phone]    08/23/2019, 11:07 AM  This note was partially dictated with voice recognition software. Similar sounding words can be transcribed inadequately or may not  be corrected upon review.

## 2019-08-28 ENCOUNTER — Other Ambulatory Visit: Payer: Self-pay

## 2019-08-28 ENCOUNTER — Ambulatory Visit (INDEPENDENT_AMBULATORY_CARE_PROVIDER_SITE_OTHER): Payer: 59 | Admitting: Student

## 2019-08-28 ENCOUNTER — Encounter: Payer: Self-pay | Admitting: Student

## 2019-08-28 VITALS — BP 124/76 | HR 68 | Ht 66.0 in | Wt 145.8 lb

## 2019-08-28 DIAGNOSIS — I2510 Atherosclerotic heart disease of native coronary artery without angina pectoris: Secondary | ICD-10-CM

## 2019-08-28 DIAGNOSIS — I10 Essential (primary) hypertension: Secondary | ICD-10-CM | POA: Diagnosis not present

## 2019-08-28 DIAGNOSIS — Z79899 Other long term (current) drug therapy: Secondary | ICD-10-CM

## 2019-08-28 DIAGNOSIS — I739 Peripheral vascular disease, unspecified: Secondary | ICD-10-CM

## 2019-08-28 DIAGNOSIS — E785 Hyperlipidemia, unspecified: Secondary | ICD-10-CM | POA: Diagnosis not present

## 2019-08-28 DIAGNOSIS — I6523 Occlusion and stenosis of bilateral carotid arteries: Secondary | ICD-10-CM

## 2019-08-28 DIAGNOSIS — F17200 Nicotine dependence, unspecified, uncomplicated: Secondary | ICD-10-CM

## 2019-08-28 NOTE — Patient Instructions (Signed)
Medication Instructions:  Your physician recommends that you continue on your current medications as directed. Please refer to the Current Medication list given to you today.  *If you need a refill on your cardiac medications before your next appointment, please call your pharmacy*   Lab Work: Your physician recommends that you return for lab work with next blood draw.   If you have labs (blood work) drawn today and your tests are completely normal, you will receive your results only by:  [place] (if you have MyChart) OR  A paper copy in the mail If you have any lab test that is abnormal or we need to change your treatment, we will call you to review the results.   Testing/Procedures: NONE    Follow-Up: At [facility], you and your health needs are our priority.  As part of our continuing mission to provide you with exceptional heart care, we have created designated Provider Care Teams.  These Care Teams include your primary Cardiologist (physician) and Advanced Practice Providers (APPs -  Physician Assistants and Nurse Practitioners) who all work together to provide you with the care you need, when you need it.  We recommend signing up for the patient portal called "MyChart".  Sign up information is provided on this After Visit Summary.  MyChart is used to connect with patients for Virtual Visits (Telemedicine).  Patients are able to view lab/test results, encounter notes, upcoming appointments, etc.  Non-urgent messages can be sent to your provider as well.   To learn more about what you can do with MyChart, go to [email or web address].    Your next appointment:   1 year(s)  The format for your next appointment:   In Person  Provider:   Carlyle Dolly, MD   Other Instructions Thank you for choosing [facility]!

## 2019-08-28 NOTE — Progress Notes (Addendum)
Cardiology Office Note    Date:  08/28/2019   ID:  [name], Hess February 27, 1956, MRN [id number]  PCP:  Redmond School, MD  Cardiologist: Carlyle Dolly, MD    Chief Complaint  Patient presents with  . Follow-up    6 month visit    History of Present Illness:    Grace Hess is a 64 y.o. female with past medical history of CAD (s/p DES to RCA in 07/2016 with residual mid-LAD stenosis), PAD (known CTO of left SFA), carotid artery stenosis (s/p L CEA), HTN, HLD, Type 2 DM and tobacco use who presents to the office today for 36-monthfollow-up.  She was last examined Dr. BHarl Bowiein 12/2018 and reported occasional chest discomfort in the evening hours but no exertional symptoms. She had been experiencing muscle aches which she thought was secondary to statin therapy and had reduced her Crestor from 40 mg daily to 20 mg daily with improvement in her symptoms. She was continued on ASA, Plavix, Lopressor 12.5 mg twice daily and Crestor 20 mg daily with Lisinopril being titrated to 20 mg daily given elevated BP.  She called the office in 01/2019 reporting elevated readings and Lisinopril was further titrated to 20 mg daily.  In talking with the patient today, she reports overall doing well from a cardiac perspective since her last visit. She denies any recent exertional chest pain or dyspnea on exertion. She does report having an episode of chest discomfort several months ago which occurred at rest and resolved with sublingual nitroglycerin. No recurrent symptoms since. She denies any recent orthopnea, PND or lower extremity edema.  She does experience easy bruising since being on ASA and Plavix.   She continues to smoke approximately 1.5 - 2.0 ppd. Says this varies depending on her stress.   Past Medical History:  Diagnosis Date  . Anxiety   . Arthritis    "might have a touch in my fingers" (08/02/2016)  . CAD (coronary artery disease)    a. s/p DES to RCA in 07/2016 with residual  mid-LAD stenosis  . Cervicalgia   . Chest pain, unspecified   . GERD (gastroesophageal reflux disease)   . Headache    "bad ones after my stroke in 2016; only have them when I get bad news now" (08/02/2016)  . Heart murmur   . Heavy smoker (more than 20 cigarettes per day)    Scheduled for LDCT screening 02/11/15  . Hepatitis A    "related to [employer]  . History of hiatal hernia   . History of kidney stones    "no cysto/OR" (12/10/2014)  . Hypercholesteremia   . Hypertension   . Obesity   . Reflux esophagitis   . Stroke ([facility]    "they said I had a silent stroke a few months back/MRI" (12/10/2014)  . TIA (transient ischemic attack) 11/28/2014   /[name]9/15/2016  . Type II diabetes mellitus ([place]     Past Surgical History:  Procedure Laterality Date  . ABDOMINAL AORTOGRAM N/A 08/02/2016   Procedure: Abdominal Aortogram;  Surgeon: ENelva Bush MD;  [place];  Service: Cardiovascular;  Laterality: N/A;  . CAROTID STENT    . CHOLECYSTECTOMY OPEN  1978  . CORONARY ANGIOPLASTY WITH STENT PLACEMENT  08/02/2016   to RCA  . CORONARY STENT INTERVENTION N/A 08/02/2016   Procedure: Coronary Stent Intervention;  Surgeon: ENelva Bush MD;  [facility];  Service: Cardiovascular;  Laterality: N/A;  RCA  .  ENDARTERECTOMY Left 12/02/2014   Procedure: ENDARTERECTOMY CAROTID;  Surgeon: Rosetta Posner, MD;  Location: [place];  Service: Vascular;  Laterality: Left;  . LEFT HEART CATH AND CORONARY ANGIOGRAPHY N/A 08/02/2016   Procedure: Left Heart Cath and Coronary Angiography;  Surgeon: Nelva Bush, MD;  Location: [place] [facility];  Service: Cardiovascular;  Laterality: N/A;  . LOWER EXTREMITY ANGIOGRAM  08/02/2016  . LOWER EXTREMITY ANGIOGRAPHY N/A 08/02/2016   Procedure: Lower Extremity Angiography;  Surgeon: Nelva Bush, MD;  Location: [place] [facility];  Service: Cardiovascular;  Laterality: N/A;  . VAGINAL HYSTERECTOMY  1991   "partial"     Current Medications: Outpatient Medications Prior to Visit  Medication Sig Dispense Refill  . acetaminophen (TYLENOL) 325 MG tablet Take 2 tablets (650 mg total) by mouth every 4 (four) hours as needed for headache or mild pain.    Marland Kitchen ALPRAZolam (XANAX) 0.5 MG tablet Take 0.5 mg by mouth 2 (two) times daily as needed.    Marland Kitchen aspirin EC 81 MG tablet Take 1 tablet (81 mg total) by mouth daily. Please continue Aspirin and plavix for 6 months, then plavix daily 30 tablet 6  . Blood Glucose Monitoring Suppl (ACCU-CHEK GUIDE) w/Device KIT 1 Piece by Does not apply route as directed. 1 kit 0  . clopidogrel (PLAVIX) 75 MG tablet Take 1 tablet (75 mg total) by mouth daily. 30 tablet 3  . dicyclomine (BENTYL) 10 MG capsule Take 1 capsule (10 mg total) by mouth 2 (two) times daily. 60 capsule 5  . gabapentin (NEURONTIN) 300 MG capsule Take 300 mg by mouth 2 (two) times daily.     Marland Kitchen glucose blood (ACCU-CHEK GUIDE) test strip Use as instructed 150 each 2  . LEXAPRO 10 MG tablet Take 20 mg by mouth daily.     Marland Kitchen lisinopril (ZESTRIL) 20 MG tablet Take 1 tablet (20 mg total) by mouth daily. 90 tablet 3  . metFORMIN (GLUCOPHAGE) 500 MG tablet Take 1 tablet (500 mg total) by mouth 2 (two) times daily with a meal. 180 tablet 1  . metoprolol tartrate (LOPRESSOR) 25 MG tablet Take 0.5 tablets (12.5 mg total) by mouth 2 (two) times daily. 90 tablet 3  . nicotine (NICODERM CQ) 21 mg/24hr patch USE 1- 21 MG PATCH DAILY FOR 6 WEEKS, THEN DECREASE TO 14 MG DAILY FOR 2 WEEKS, THEN DECREASE TO 7 MG DAILY FOR 2 WEEKS. 70 patch 0  . nitroGLYCERIN (NITROSTAT) 0.4 MG SL tablet Place 1 tablet (0.4 mg total) under the tongue every 5 (five) minutes as needed for chest pain. 25 tablet 3  . nystatin cream (MYCOSTATIN) Apply 1 application topically 2 (two) times daily as needed.  5  . ondansetron (ZOFRAN) 4 MG tablet TAKE 1 TABLET(4 MG) BY MOUTH TWICE DAILY AS NEEDED FOR NAUSEA OR VOMITING 20 tablet 1  . oxyCODONE-acetaminophen  (PERCOCET/ROXICET) 5-325 MG tablet Take 1 tablet by mouth 3 (three) times daily as needed.    . pantoprazole (PROTONIX) 40 MG tablet Take 1 tablet (40 mg total) by mouth daily before breakfast. 30 tablet 5  . rosuvastatin (CRESTOR) 20 MG tablet Take 1 tablet (20 mg total) by mouth daily. 90 tablet 3  . Semaglutide,0.25 or 0.'5MG'$ /DOS, 2 MG/1.5ML SOPN Inject 0.5 mg into the skin once a week.    . Vitamin D, Ergocalciferol, (DRISDOL) 1.25 MG (50000 UNIT) CAPS capsule Take 1 capsule (50,000 Units total) by mouth every 7 (seven) days. 12 capsule 0   No facility-administered medications prior to  visit.     Allergies:   Codeine, Latex, and Sulfa antibiotics   Social History   Socioeconomic History  . Marital status: Married    Spouse name: Not on file  . Number of children: 2  . Years of education: 93  . Highest education level: Not on file  Occupational History  . Not on file  Tobacco Use  . Smoking status: Current Every Day Smoker    Packs/day: 2.00    Years: 43.00    Pack years: 86.00    Types: Cigarettes    Start date: 05/17/1974  . Smokeless tobacco: Never Used  Vaping Use  . Vaping Use: Never used  Substance and Sexual Activity  . Alcohol use: No    Alcohol/week: 0.0 standard drinks  . Drug use: No  . Sexual activity: Never  Other Topics Concern  . Not on file  Social History Narrative   Patient does not drink caffeine.   Patient is right handed.    Social Determinants of Health   Financial Resource Strain:   . Difficulty of Paying Living Expenses:   Food Insecurity:   . Worried About Charity fundraiser in the Last Year:   . Arboriculturist in the Last Year:   Transportation Needs:   . Film/video editor (Medical):   Marland Kitchen Lack of Transportation (Non-Medical):   Physical Activity:   . Days of Exercise per Week:   . Minutes of Exercise per Session:   Stress:   . Feeling of Stress :   Social Connections:   . Frequency of Communication with Friends and Family:   .  Frequency of Social Gatherings with Friends and Family:   . Attends Religious Services:   . Active Member of Clubs or Organizations:   . Attends Archivist Meetings:   Marland Kitchen Marital Status:      Family History:  The patient's family history includes COPD in her father; Cancer in her brother and brother; Congestive Heart Failure in her mother.   Review of Systems:   Please see the history of present illness.     General:  No chills, fever, night sweats or weight changes.  Cardiovascular:  No chest pain, dyspnea on exertion, edema, orthopnea, palpitations, paroxysmal nocturnal dyspnea. Dermatological: No rash, lesions/masses. Positive for easy bruising.  Respiratory: No cough, dyspnea Urologic: No hematuria, dysuria Abdominal:   No nausea, vomiting, diarrhea, bright red blood per rectum, melena, or hematemesis Neurologic:  No visual changes, wkns, changes in mental status. All other systems reviewed and are otherwise negative except as noted above.   Physical Exam:    VS:  BP 124/76   Pulse 68   Ht '5\' 6"'$  (1.676 m)   Wt 145 lb 12.8 oz (66.1 kg)   SpO2 96%   BMI 23.53 kg/m    General: Well developed, well nourished,female appearing in no acute distress. Head: Normocephalic, atraumatic, sclera non-icteric.  Neck: No carotid bruits. JVD not elevated.  Lungs: Respirations regular and unlabored, without wheezes or rales.  Heart: Regular rate and rhythm. No S3 or S4.  No rubs or gallops appreciated. 2/6 SEM along RUSB.  Abdomen: Soft, non-tender, non-distended. No obvious abdominal masses. Msk:  Strength and tone appear normal for age. No obvious joint deformities or effusions. Extremities: No clubbing or cyanosis. No lower extremity edema.  Distal pedal pulses are 2+ bilaterally. Neuro: Alert and oriented X 3. Moves all extremities spontaneously. No focal deficits noted. Psych:  Responds to questions appropriately  with a normal affect. Skin: No rashes or lesions noted  Wt  Readings from Last 3 Encounters:  08/28/19 145 lb 12.8 oz (66.1 kg)  08/23/19 146 lb 12.8 oz (66.6 kg)  07/17/19 150 lb 1.6 oz (68.1 kg)     Studies/Labs Reviewed:   EKG:  EKG is ordered today.  The ekg ordered today demonstrates NSR, HR 67 with anterior infarct pattern. No acute ST abnormalities when compared to prior tracings.   Recent Labs: 08/20/2019: ALT 12; BUN 35; Creat 1.38; Potassium 4.6; Sodium 138; TSH 1.10   Lipid Panel    Component Value Date/Time   CHOL 143 01/16/2019 0937   TRIG 244 (H) 01/16/2019 0937   HDL 40 (L) 01/16/2019 0937   CHOLHDL 3.6 01/16/2019 0937   VLDL UNABLE TO CALCULATE IF TRIGLYCERIDE OVER 400 mg/dL 11/29/2014 0630   LDLCALC 69 01/16/2019 0937    Additional studies/ records that were reviewed today include:   Echocardiogram: 12/2018 IMPRESSIONS    1. Left ventricular ejection fraction, by visual estimation, is 65 to  70%. The left ventricle has normal function. There is mildly increased  left ventricular hypertrophy.  2. Global right ventricle has normal systolic function.The right  ventricular size is normal. No increase in right ventricular wall  thickness.  3. Left atrial size was normal.  4. Right atrial size was normal.  5. Presence of pericardial fat pad.  6. The pericardial effusion is circumferential.  7. Trivial pericardial effusion is present.  8. Mild aortic valve annular calcification.  9. The mitral valve is normal in structure. No evidence of mitral valve  regurgitation. No evidence of mitral stenosis.  10. The tricuspid valve is normal in structure. Tricuspid valve  regurgitation is not demonstrated.  11. The aortic valve has an indeterminant number of cusps. Aortic valve  regurgitation is not visualized. Mild aortic valve sclerosis without  stenosis.  12. There is Mild calcification of the aortic valve.  13. There is Mild thickening of the aortic valve.  14. The pulmonic valve was not well visualized. Pulmonic  valve  regurgitation is not visualized.   Assessment:    1. Coronary artery disease involving native coronary artery of native heart without angina pectoris   2. Essential hypertension   3. Hyperlipidemia LDL goal <70   4. Medication management   5. PAD (peripheral artery disease) ([place])   6. Bilateral carotid artery stenosis   7. Current smoker      Plan:   In order of problems listed above:  1. CAD - she is s/p DES to RCA in 07/2016 with residual mid-LAD stenosis. She denies any recent exertional chest pain or dyspnea on exertion. Continue current medical therapy with ASA 81 mg daily, Plavix 75 mg daily, Lopressor 12.5 mg twice daily and Crestor 20 mg daily. By review of notes, DAPT has been continued long-term given her CAD, PAD and carotid artery stenosis. She does experience easy bruising and will ask for a CBC to be obtained with her upcoming labs.   2. HTN - BP is well controlled at 124/76 during today's visit. Continue Lisinopril 20 mg daily and Lopressor 12.5 mg twice daily.  3. HLD - Followed by PCP. FLP in 01/2019 showed total cholesterol 143, HDL 40, triglycerides 244 and LDL 69. Continue Crestor 20 mg daily.  4. PVD - followed by Vascular. She has a known CTO of the left SFA and medical management has been recommended.  She remains on ASA 81 mg daily, Plavix 75 mg  daily and Crestor 20 mg daily.  5. Carotid Artery Stenosis - she is s/p L CEA and dopplers in 2019 showed 60 to 79% RICA stenosis and 1 to 35% LICA stenosis.  She is overdue for a repeat study and plans to call the Vascular office to get this scheduled as orders are already in the system.  6. Tobacco Use - She continues to smoke approximately 1.5 - 2.0 ppd. Reduction advised. Reports her daily amount is variable secondary to stress.    Medication Adjustments/Labs and Tests Ordered: Current medicines are reviewed at length with the patient today.  Concerns regarding medicines are outlined above.  Medication  changes, Labs and Tests ordered today are listed in the Patient Instructions below. Patient Instructions  Medication Instructions:  Your physician recommends that you continue on your current medications as directed. Please refer to the Current Medication list given to you today.  *If you need a refill on your cardiac medications before your next appointment, please call your pharmacy*   Lab Work: Your physician recommends that you return for lab work with next blood draw.   If you have labs (blood work) drawn today and your tests are completely normal, you will receive your results only by: Marland Kitchen MyChart Message (if you have MyChart) OR . A paper copy in the mail If you have any lab test that is abnormal or we need to change your treatment, we will call you to review the results.   Testing/Procedures: NONE    Follow-Up: At [facility], you and your health needs are our priority.  As part of our continuing mission to provide you with exceptional heart care, we have created designated Provider Care Teams.  These Care Teams include your primary Cardiologist (physician) and Advanced Practice Providers (APPs -  Physician Assistants and Nurse Practitioners) who all work together to provide you with the care you need, when you need it.  We recommend signing up for the patient portal called "MyChart".  Sign up information is provided on this After Visit Summary.  MyChart is used to connect with patients for Virtual Visits (Telemedicine).  Patients are able to view lab/test results, encounter notes, upcoming appointments, etc.  Non-urgent messages can be sent to your provider as well.   To learn more about what you can do with MyChart, go to [email or web address].    Your next appointment:   1 year(s)  The format for your next appointment:   In Person  Provider:   Carlyle Dolly, MD   Other Instructions Thank you for choosing [place]!     Signed, Erma Heritage,  PA-C  08/28/2019 4:59 PM    [facility] [address] [place], [place] [address] Phone: [phone] Fax: [phone]

## 2019-09-05 ENCOUNTER — Ambulatory Visit: Payer: 59 | Admitting: Cardiology

## 2019-10-25 ENCOUNTER — Telehealth: Payer: Self-pay | Admitting: Cardiology

## 2019-10-25 MED ORDER — NITROGLYCERIN 0.4 MG SL SUBL: 0.4000 mg | SUBLINGUAL_TABLET | SUBLINGUAL | 3 refills | Status: DC | PRN

## 2019-10-25 NOTE — Telephone Encounter (Signed)
refilled  ntg

## 2019-10-25 NOTE — Telephone Encounter (Signed)
New message     *STAT* If patient is at the pharmacy, call can be transferred to refill team.   1. Which medications need to be refilled? (please list name of each medication and dose if known) nitroGLYCERIN (NITROSTAT) 0.4 MG SL tablet  2. Which pharmacy/location (including street and city if local pharmacy) is medication to be sent to [facility] on [address]   3. Do they need a 30 day or 90 day supply? [place]

## 2019-11-23 ENCOUNTER — Other Ambulatory Visit (INDEPENDENT_AMBULATORY_CARE_PROVIDER_SITE_OTHER): Payer: Self-pay | Admitting: Internal Medicine

## 2019-12-04 ENCOUNTER — Ambulatory Visit: Payer: 59 | Admitting: Cardiology

## 2019-12-21 ENCOUNTER — Telehealth: Payer: Self-pay | Admitting: Cardiology

## 2019-12-21 MED ORDER — METOPROLOL TARTRATE 25 MG PO TABS: 12.5000 mg | ORAL_TABLET | Freq: Two times a day (BID) | ORAL | 3 refills | Status: DC

## 2019-12-21 NOTE — Telephone Encounter (Signed)
Refilled per request.

## 2019-12-21 NOTE — Telephone Encounter (Signed)
New message      *STAT* If patient is at the pharmacy, call can be transferred to refill team.   1. Which medications need to be refilled? (please list name of each medication and dose if known) metoprolol tartrate (LOPRESSOR) 25 MG tablet 2. Which pharmacy/location (including street and city if local pharmacy) is medication to be sent to? [facility] on [address]   3. Do they need a 30 day or 90 day supply?  [place]

## 2019-12-25 ENCOUNTER — Ambulatory Visit: Payer: 59 | Admitting: Nurse Practitioner

## 2019-12-26 ENCOUNTER — Ambulatory Visit (INDEPENDENT_AMBULATORY_CARE_PROVIDER_SITE_OTHER): Payer: 59 | Admitting: Nurse Practitioner

## 2019-12-26 ENCOUNTER — Other Ambulatory Visit: Payer: Self-pay

## 2019-12-26 ENCOUNTER — Encounter: Payer: Self-pay | Admitting: Nurse Practitioner

## 2019-12-26 VITALS — BP 111/65 | HR 66 | Ht 66.0 in | Wt 143.8 lb

## 2019-12-26 DIAGNOSIS — E1159 Type 2 diabetes mellitus with other circulatory complications: Secondary | ICD-10-CM | POA: Diagnosis not present

## 2019-12-26 DIAGNOSIS — E559 Vitamin D deficiency, unspecified: Secondary | ICD-10-CM

## 2019-12-26 DIAGNOSIS — I10 Essential (primary) hypertension: Secondary | ICD-10-CM

## 2019-12-26 DIAGNOSIS — E782 Mixed hyperlipidemia: Secondary | ICD-10-CM | POA: Diagnosis not present

## 2019-12-26 LAB — POCT GLYCOSYLATED HEMOGLOBIN (HGB A1C): Hemoglobin A1C: 7.7 % — AB (ref 4.0–5.6)

## 2019-12-26 NOTE — Progress Notes (Signed)
12/26/2019, 11:36 AM                                Endocrinology follow-up note   Subjective:    Patient ID: Grace Hess, female    DOB: [birth year].  Grace Hess is being seen in follow-up for management of currently uncontrolled symptomatic diabetes requested by  Redmond School, MD.   Past Medical History:  Diagnosis Date   Anxiety    Arthritis    "might have a touch in my fingers" (08/02/2016)   CAD (coronary artery disease)    a. s/p DES to RCA in 07/2016 with residual mid-LAD stenosis   Cervicalgia    Chest pain, unspecified    GERD (gastroesophageal reflux disease)    Headache    "bad ones after my stroke in 2016; only have them when I get bad news now" (08/02/2016)   Heart murmur    Heavy smoker (more than 20 cigarettes per day)    Scheduled for LDCT screening 02/11/15   Hepatitis A    "related to Janine Limbo"   History of hiatal hernia    History of kidney stones    "no cysto/OR" (12/10/2014)   Hypercholesteremia    Hypertension    Obesity    Reflux esophagitis    Stroke [facility])    "they said I had a silent stroke a few months back/MRI" (12/10/2014)   TIA (transient ischemic attack) 11/28/2014   [name] 11/28/2014   Type II diabetes mellitus ([place])     Past Surgical History:  Procedure Laterality Date   ABDOMINAL AORTOGRAM N/A 08/02/2016   Procedure: Abdominal Aortogram;  Surgeon: Nelva Bush, MD;  Location: [place] [facility];  Service: Cardiovascular;  Laterality: N/A;   CAROTID STENT     CHOLECYSTECTOMY OPEN  1978   CORONARY ANGIOPLASTY WITH STENT PLACEMENT  08/02/2016   to RCA   CORONARY STENT INTERVENTION N/A 08/02/2016   Procedure: Coronary Stent Intervention;  Surgeon: Nelva Bush, MD;  Location: [place] [facility];  Service: Cardiovascular;  Laterality: N/A;  RCA   ENDARTERECTOMY Left 12/02/2014   Procedure: ENDARTERECTOMY CAROTID;   Surgeon: Rosetta Posner, MD;  Location: [place];  Service: Vascular;  Laterality: Left;   LEFT HEART CATH AND CORONARY ANGIOGRAPHY N/A 08/02/2016   Procedure: Left Heart Cath and Coronary Angiography;  Surgeon: Nelva Bush, MD;  Location: [place] [facility];  Service: Cardiovascular;  Laterality: N/A;   LOWER EXTREMITY ANGIOGRAM  08/02/2016   LOWER EXTREMITY ANGIOGRAPHY N/A 08/02/2016   Procedure: Lower Extremity Angiography;  Surgeon: Nelva Bush, MD;  Location: [place] [facility];  Service: Cardiovascular;  Laterality: N/A;   VAGINAL HYSTERECTOMY  1991   "partial"    Social History   Socioeconomic History   Marital status: Married    Spouse name: Not on file   Number of children: 2   Years of education: 12   Highest education level: Not on file  Occupational History   Not on file  Tobacco Use   Smoking status: Current Every  Day Smoker    Packs/day: 2.00    Years: 43.00    Pack years: 86.00    Types: Cigarettes    Start date: 05/17/1974   Smokeless tobacco: Never Used  Vaping Use   Vaping Use: Never used  Substance and Sexual Activity   Alcohol use: No    Alcohol/week: 0.0 standard drinks   Drug use: No   Sexual activity: Never  Other Topics Concern   Not on file  Social History Narrative   Patient does not drink caffeine.   Patient is right handed.    Social Determinants of Health   Financial Resource Strain:    Difficulty of Paying Living Expenses: Not on file  Food Insecurity:    Worried About Charity fundraiser in the Last Year: Not on file   [employer] of Food in the Last Year: Not on file  Transportation Needs:    Lack of Transportation (Medical): Not on file   Lack of Transportation (Non-Medical): Not on file  Physical Activity:    Days of Exercise per Week: Not on file   Minutes of Exercise per Session: Not on file  Stress:    Feeling of Stress : Not on file  Social Connections:    Frequency of Communication with Friends and  Family: Not on file   Frequency of Social Gatherings with Friends and Family: Not on file   Attends Religious Services: Not on file   Active Member of Clubs or Organizations: Not on file   Attends Archivist Meetings: Not on file   Marital Status: Not on file    Family History  Problem Relation Age of Onset   Congestive Heart Failure Mother    COPD Father    Cancer Brother    Cancer Brother     Outpatient Encounter Medications as of 12/26/2019  Medication Sig   acetaminophen (TYLENOL) 325 MG tablet Take 2 tablets (650 mg total) by mouth every 4 (four) hours as needed for headache or mild pain.   ALPRAZolam (XANAX) 0.5 MG tablet Take 0.5 mg by mouth 2 (two) times daily as needed.   aspirin EC 81 MG tablet Take 1 tablet (81 mg total) by mouth daily. Please continue Aspirin and plavix for 6 months, then plavix daily   Blood Glucose Monitoring Suppl (ACCU-CHEK GUIDE) w/Device KIT 1 Piece by Does not apply route as directed.   clopidogrel (PLAVIX) 75 MG tablet Take 1 tablet (75 mg total) by mouth daily.   dicyclomine (BENTYL) 10 MG capsule Take 1 capsule (10 mg total) by mouth 2 (two) times daily.   gabapentin (NEURONTIN) 300 MG capsule Take 300 mg by mouth 2 (two) times daily.    glucose blood (ACCU-CHEK GUIDE) test strip Use as instructed   LEXAPRO 10 MG tablet Take 20 mg by mouth daily.    lisinopril (ZESTRIL) 20 MG tablet Take 1 tablet (20 mg total) by mouth daily.   metFORMIN (GLUCOPHAGE) 500 MG tablet Take 1 tablet (500 mg total) by mouth 2 (two) times daily with a meal.   metoprolol tartrate (LOPRESSOR) 25 MG tablet Take 0.5 tablets (12.5 mg total) by mouth 2 (two) times daily.   nicotine (NICODERM CQ) 21 mg/24hr patch USE 1- 21 MG PATCH DAILY FOR 6 WEEKS, THEN DECREASE TO 14 MG DAILY FOR 2 WEEKS, THEN DECREASE TO 7 MG DAILY FOR 2 WEEKS.   nitroGLYCERIN (NITROSTAT) 0.4 MG SL tablet Place 1 tablet (0.4 mg total) under the tongue every 5 (five)  minutes  as needed for chest pain.   nystatin cream (MYCOSTATIN) Apply 1 application topically 2 (two) times daily as needed.   ondansetron (ZOFRAN) 4 MG tablet TAKE 1 TABLET(4 MG) BY MOUTH TWICE DAILY AS NEEDED FOR NAUSEA OR VOMITING   oxyCODONE-acetaminophen (PERCOCET/ROXICET) 5-325 MG tablet Take 1 tablet by mouth 3 (three) times daily as needed.   pantoprazole (PROTONIX) 40 MG tablet Take 1 tablet (40 mg total) by mouth daily before breakfast.   Semaglutide,0.25 or 0.5MG/DOS, 2 MG/1.5ML SOPN Inject 0.5 mg into the skin once a week.   Vitamin D, Ergocalciferol, (DRISDOL) 1.25 MG (50000 UNIT) CAPS capsule Take 1 capsule (50,000 Units total) by mouth every 7 (seven) days.   rosuvastatin (CRESTOR) 20 MG tablet Take 1 tablet (20 mg total) by mouth daily.   No facility-administered encounter medications on file as of 12/26/2019.    ALLERGIES: Allergies  Allergen Reactions   Codeine Shortness Of Breath   Latex Itching   Sulfa Antibiotics Other (See Comments)    Reaction:  Unknown     VACCINATION STATUS: Immunization History  Administered Date(s) Administered   Influenza,inj,Quad PF,6+ Mos 11/29/2014   Pneumococcal Polysaccharide-23 11/29/2014    Diabetes She presents for her follow-up diabetic visit. She has type 2 diabetes mellitus. Onset time: She was diagnosed at approximate age of 53 years. Her disease course has been worsening. There are no hypoglycemic associated symptoms. Pertinent negatives for hypoglycemia include no confusion, headaches, pallor or seizures. Associated symptoms include fatigue and foot paresthesias. Pertinent negatives for diabetes include no chest pain, no polydipsia, no polyphagia and no polyuria. There are no hypoglycemic complications. Symptoms are worsening. Diabetic complications include a CVA, heart disease and nephropathy. Risk factors for coronary artery disease include dyslipidemia, diabetes mellitus, family history, hypertension, tobacco exposure,  sedentary lifestyle and post-menopausal. Current diabetic treatment includes oral agent (monotherapy) (and Ozempic 0.5 mg weekly). She is compliant with treatment most of the time. Her weight is increasing steadily. She is following a generally unhealthy diet. When asked about meal planning, she reported none. She has not had a previous visit with a dietitian. She never participates in exercise. Her home blood glucose trend is decreasing steadily. Her breakfast blood glucose range is generally 140-180 mg/dl. (She presents today with her meter, no logs, showing average fasting blood glucose of 165-170.  Her POCT A1C today is 7.7%, worsening from last visit of 7.3%.  She reports she is not eating as healthy as she should which is why her sugar has been running high.  She denies any episodes of hypoglycemia.) An Hess inhibitor/angiotensin II receptor blocker is being taken. She does not see a podiatrist.Eye exam is not current.  Hyperlipidemia This is a chronic problem. The current episode started more than 1 year ago. The problem is uncontrolled. Recent lipid tests were reviewed and are variable. Exacerbating diseases include chronic renal disease and diabetes. Factors aggravating her hyperlipidemia include beta blockers, fatty foods and smoking. Pertinent negatives include no chest pain, myalgias or shortness of breath. Current antihyperlipidemic treatment includes statins. The current treatment provides mild improvement of lipids. Compliance problems include adherence to diet and adherence to exercise.  Risk factors for coronary artery disease include diabetes mellitus, dyslipidemia, hypertension, a sedentary lifestyle, post-menopausal and family history.  Hypertension This is a chronic problem. The problem has been gradually improving since onset. The problem is controlled. Pertinent negatives include no chest pain, headaches, palpitations or shortness of breath. There are no associated agents to hypertension.  Risk factors  for coronary artery disease include dyslipidemia, diabetes mellitus, sedentary lifestyle, smoking/tobacco exposure and post-menopausal state. Past treatments include Hess inhibitors and beta blockers. The current treatment provides moderate improvement. Compliance problems include exercise and diet.  Hypertensive end-organ damage includes kidney disease, CAD/MI and CVA. Identifiable causes of hypertension include chronic renal disease.   Review of systems  Constitutional: + steady, slow weight loss,  current Body mass index is 23.21 kg/m. , no fatigue, no subjective hyperthermia, no subjective hypothermia, + decreased appetite. Eyes: no blurry vision, no xerophthalmia ENT: no sore throat, no nodules palpated in throat, no dysphagia/odynophagia, no hoarseness Cardiovascular: no chest pain, no shortness of breath, no palpitations, no leg swelling Respiratory: no cough, no shortness of breath Gastrointestinal: no nausea/vomiting/diarrhea Musculoskeletal: no muscle/joint aches Skin: no rashes, no hyperemia Neurological: no tremors, no numbness, no tingling, no dizziness Psychiatric: no depression, no anxiety  Objective:    BP 111/65 (BP Location: Right Arm, Patient Position: Sitting)    Pulse 66    Ht '5\' 6"'  (1.676 m)    Wt 143 lb 12.8 oz (65.2 kg)    BMI 23.21 kg/m   Wt Readings from Last 3 Encounters:  12/26/19 143 lb 12.8 oz (65.2 kg)  08/28/19 145 lb 12.8 oz (66.1 kg)  08/23/19 146 lb 12.8 oz (66.6 kg)    BP Readings from Last 3 Encounters:  12/26/19 111/65  08/28/19 124/76  08/23/19 115/67    Physical Exam- Limited  Constitutional:  Body mass index is 23.21 kg/m. , not in acute distress, normal state of mind Eyes:  EOMI, no exophthalmos Neck: Supple Thyroid: No gross goiter Respiratory: Adequate breathing efforts Musculoskeletal: no gross deformities, strength intact in all four extremities, no gross restriction of joint movements Skin:  no rashes, no  hyperemia Neurological: no tremor with outstretched hands,    Foot exam:   No rashes, ulcers, cuts, calluses, onychodystrophy.   Good pulses bilat.  Good sensation to 10 g monofilament bilat.    CMP     Component Value Date/Time   NA 138 08/20/2019 0719   K 4.6 08/20/2019 0719   CL 108 08/20/2019 0719   CO2 23 08/20/2019 0719   GLUCOSE 146 (H) 08/20/2019 0719   BUN 35 (H) 08/20/2019 0719   CREATININE 1.38 (H) 08/20/2019 0719   CALCIUM 9.8 08/20/2019 0719   PROT 7.1 08/20/2019 0719   ALBUMIN 4.2 09/14/2015 2013   AST 13 08/20/2019 0719   ALT 12 08/20/2019 0719   ALKPHOS 58 09/14/2015 2013   BILITOT 0.4 08/20/2019 0719   GFRNONAA 40 (L) 08/20/2019 0719   GFRAA 47 (L) 08/20/2019 0719     Diabetic Labs (most recent): Lab Results  Component Value Date   HGBA1C 7.7 (A) 12/26/2019   HGBA1C 7.3 (A) 08/23/2019   HGBA1C 6.9 (A) 04/25/2019    Lipid Panel     Component Value Date/Time   CHOL 143 01/16/2019 0937   TRIG 244 (H) 01/16/2019 0937   HDL 40 (L) 01/16/2019 0937   CHOLHDL 3.6 01/16/2019 0937   VLDL UNABLE TO CALCULATE IF TRIGLYCERIDE OVER 400 mg/dL 11/29/2014 0630   LDLCALC 69 01/16/2019 0937       Assessment & Plan:   1. DM type 2 causing vascular disease ([place])  - Grace Hess has currently uncontrolled symptomatic type 2 DM since  64 years of age.  She presents today with her meter, no logs, showing average fasting blood glucose of 165-170.  Her POCT A1C today is 7.7%, worsening from  last visit of 7.3%.  She reports she is not eating as healthy as she should which is why her sugar has been running high.  She denies any episodes of hypoglycemia.  - I had a long discussion with her about the progressive nature of diabetes and the pathology behind its complications. -her diabetes is complicated by coronary artery disease, CVA, nephropathy, peripheral neuropathy, chronic heavy smoking, sedentary life and she remains at a high risk for more acute and  chronic complications which include CAD, CVA, CKD, retinopathy, and neuropathy. These are all discussed in detail with her.  - Nutritional counseling repeated at each appointment due to patients tendency to fall back in to old habits.  - The patient admits there is a room for improvement in their diet and drink choices. -  Suggestion is made for the patient to avoid simple carbohydrates from their diet including Cakes, Sweet Desserts / Pastries, Ice Cream, Soda (diet and regular), Sweet Tea, Candies, Chips, Cookies, Sweet Pastries,  Store Bought Juices, Alcohol in Excess of  1-2 drinks a day, Artificial Sweeteners, Coffee Creamer, and "Sugar-free" Products. This will help patient to have stable blood glucose profile and potentially avoid unintended weight gain.   - I encouraged the patient to switch to  unprocessed or minimally processed complex starch and increased protein intake (animal or plant source), fruits, and vegetables.   - Patient is advised to stick to a routine mealtimes to eat 3 meals  a day and avoid unnecessary snacks ( to snack only to correct hypoglycemia).  - she will be scheduled with Jearld Fenton, RDN, CDE for diabetes education.  - I have approached her with the following individualized plan to manage  her diabetes and patient agrees:   -She is advised to continue Ozempic 0.5 mg SQ weekly and Metformin 500 mg po twice daily with meals. - Given her recent loss of control, I discussed adding low-dose glipizide to her medication regimen (she has been on this medication before and although she has a documented allergy to sulfa, she tolerated it well).  However, she politely declined at this time, wants to work on diet and exercise before adding another medication. -She will not tolerate increase in Metformin due to slow decline in kidney function.  -She is encouraged to continue monitoring blood glucose daily, before breakfast, and call the clinic if she has readings less than  70 or greater than 200 for 3 tests in a row.  - Specific targets for  A1c;  LDL, HDL, Triglycerides, and  Waist Circumference were discussed with the patient.  2) Blood Pressure /Hypertension:  Her blood pressure is controlled to target.  She is advised to continue Lisinopril 20 mg po daily and Metoprolol 12.5 mg po twice daily.  3) Lipids/Hyperlipidemia:   Her most recent lipid panel from 01/16/2019 shows controlled LDL of 69 and elevated triglycerides of 244.  She is advised to continue Crestor 20 mg po daily at bedtime.  Side effects and precautions discussed with her.  She is advised to avoid fried foods and butter.  Will repeat lipid panel prior to next visit.  4)  Weight/Diet:  Her Body mass index is 23.21 kg/m.--she has lost significant amount of weight recently.  she is not a candidate for more weight loss.   Exercise, and detailed carbohydrates information provided  -  detailed on discharge instructions.  5) Chronic Care/Health Maintenance: -she  is on ACEI/ARB and Statin medications and  is encouraged to initiate and continue to  follow up with Ophthalmology, Dentist,  Podiatrist at least yearly or according to recommendations, and advised to  quit  smoking. I have recommended yearly flu vaccine and pneumonia vaccine at least every 5 years; moderate intensity exercise for up to 150 minutes weekly; and  sleep for at least 7 hours a day.  - she is  advised to maintain close follow up with Redmond School, MD for primary care needs, as well as her other providers for optimal and coordinated care.  - Time spent on this patient care encounter:  35 min, of which > 50% was spent in  counseling and the rest reviewing her blood glucose logs , discussing her hypoglycemia and hyperglycemia episodes, reviewing her current and  previous labs / studies  ( including abstraction from other facilities) and medications  doses and developing a  long term treatment plan and documenting her care.   Please  refer to Patient Instructions for Blood Glucose Monitoring and Insulin/Medications Dosing Guide"  in media tab for additional information. Please  also refer to " Patient Self Inventory" in the Media  tab for reviewed elements of pertinent patient history.  Grace Hess participated in the discussions, expressed understanding, and voiced agreement with the above plans.  All questions were answered to her satisfaction. she is encouraged to contact clinic should she have any questions or concerns prior to her return visit.   Follow up plan: - Return in about 3 months (around 03/27/2020) for Previsit labs, Diabetes follow up with A1c in office.  Grace Hess, [facility] [address] [place], [place] [address] Phone: [phone] Fax: [phone]  12/26/2019, 11:36 AM  This note was partially dictated with voice recognition software. Similar sounding words can be transcribed inadequately or may not  be corrected upon review.

## 2019-12-26 NOTE — Patient Instructions (Signed)

## 2019-12-28 ENCOUNTER — Ambulatory Visit: Payer: 59 | Admitting: Nurse Practitioner

## 2020-01-19 ENCOUNTER — Other Ambulatory Visit (INDEPENDENT_AMBULATORY_CARE_PROVIDER_SITE_OTHER): Payer: Self-pay | Admitting: Internal Medicine

## 2020-01-21 NOTE — Telephone Encounter (Signed)
Last seen 07/17/2019 for gerd by Dr. Laural Golden

## 2020-01-22 ENCOUNTER — Other Ambulatory Visit: Payer: Self-pay

## 2020-01-22 ENCOUNTER — Ambulatory Visit (INDEPENDENT_AMBULATORY_CARE_PROVIDER_SITE_OTHER): Payer: 59 | Admitting: Internal Medicine

## 2020-01-22 ENCOUNTER — Encounter (INDEPENDENT_AMBULATORY_CARE_PROVIDER_SITE_OTHER): Payer: Self-pay | Admitting: Internal Medicine

## 2020-01-22 VITALS — BP 140/75 | Temp 98.7°F | Ht 66.0 in | Wt 144.1 lb

## 2020-01-22 DIAGNOSIS — K580 Irritable bowel syndrome with diarrhea: Secondary | ICD-10-CM | POA: Diagnosis not present

## 2020-01-22 DIAGNOSIS — K219 Gastro-esophageal reflux disease without esophagitis: Secondary | ICD-10-CM | POA: Diagnosis not present

## 2020-01-22 MED ORDER — PANTOPRAZOLE SODIUM 40 MG PO TBEC: 40.0000 mg | DELAYED_RELEASE_TABLET | Freq: Every day | ORAL | 3 refills | Status: DC

## 2020-01-22 NOTE — Progress Notes (Signed)
Presenting complaint;  Follow for chronic GERD and IBS/diarrhea.  Database and subjective:  Patient is 64 year old Caucasian female who has history of chronic GERD and diarrhea felt to be due to IBS who is here for scheduled visit.  She was last seen 6 months ago when she weighed 150 pounds.  She now weighs 144 pounds.  Patient says she is doing well.  She has heartburn only when she overeats and she is using Alka-Seltzer every now and then.  She has noted more nausea since she has been taking pain medication.  She has not experienced vomiting.  She has occasional episode of dysphagia but she does not feel that is progressive or needs further attention.  Now she is having a bowel movement every other day.  She feels pain medication has slowed her bowels down.  She denies melena or rectal bleeding.  She has noted drop in her appetite.  She says only time she has diarrhea is when she eats peanuts tomatoes or spicy food.  Her last colonoscopy was in 2010 revealing single diverticulum at sigmoid colon.  She is aware that she is due for screening colonoscopy and she wants to wait until Covid pandemic is over.  Current Medications: Outpatient Encounter Medications as of 01/22/2020  Medication Sig  . acetaminophen (TYLENOL) 325 MG tablet Take 2 tablets (650 mg total) by mouth every 4 (four) hours as needed for headache or mild pain.  Marland Kitchen ALPRAZolam (XANAX) 0.5 MG tablet Take 0.5 mg by mouth 2 (two) times daily as needed.  Marland Kitchen aspirin EC 81 MG tablet Take 1 tablet (81 mg total) by mouth daily. Please continue Aspirin and plavix for 6 months, then plavix daily  . Blood Glucose Monitoring Suppl (ACCU-CHEK GUIDE) w/Device KIT 1 Piece by Does not apply route as directed.  . clopidogrel (PLAVIX) 75 MG tablet Take 1 tablet (75 mg total) by mouth daily.  Marland Kitchen dicyclomine (BENTYL) 10 MG capsule Take 1 capsule (10 mg total) by mouth 2 (two) times daily.  Marland Kitchen gabapentin (NEURONTIN) 300 MG capsule Take 300 mg by mouth 2  (two) times daily.   Marland Kitchen glucose blood (ACCU-CHEK GUIDE) test strip Use as instructed  . LEXAPRO 10 MG tablet Take 20 mg by mouth daily.   Marland Kitchen lisinopril (ZESTRIL) 20 MG tablet Take 1 tablet (20 mg total) by mouth daily.  . metFORMIN (GLUCOPHAGE) 500 MG tablet Take 1 tablet (500 mg total) by mouth 2 (two) times daily with a meal.  . metoprolol tartrate (LOPRESSOR) 25 MG tablet Take 0.5 tablets (12.5 mg total) by mouth 2 (two) times daily.  . nitroGLYCERIN (NITROSTAT) 0.4 MG SL tablet Place 1 tablet (0.4 mg total) under the tongue every 5 (five) minutes as needed for chest pain.  Marland Kitchen nystatin cream (MYCOSTATIN) Apply 1 application topically 2 (two) times daily as needed.  . ondansetron (ZOFRAN) 4 MG tablet TAKE 1 TABLET(4 MG) BY MOUTH TWICE DAILY AS NEEDED FOR NAUSEA OR VOMITING  . oxyCODONE-acetaminophen (PERCOCET/ROXICET) 5-325 MG tablet Take 1 tablet by mouth 3 (three) times daily as needed.  . pantoprazole (PROTONIX) 40 MG tablet TAKE 1 TABLET(40 MG) BY MOUTH DAILY BEFORE BREAKFAST  . Semaglutide,0.25 or 0.5MG/DOS, 2 MG/1.5ML SOPN Inject 0.5 mg into the skin once a week.  . Vitamin D, Ergocalciferol, (DRISDOL) 1.25 MG (50000 UNIT) CAPS capsule Take 1 capsule (50,000 Units total) by mouth every 7 (seven) days.  . rosuvastatin (CRESTOR) 20 MG tablet Take 1 tablet (20 mg total) by mouth daily.  . [DISCONTINUED]  nicotine (NICODERM CQ) 21 mg/24hr patch USE 1- 21 MG PATCH DAILY FOR 6 WEEKS, THEN DECREASE TO 14 MG DAILY FOR 2 WEEKS, THEN DECREASE TO 7 MG DAILY FOR 2 WEEKS. (Patient not taking: Reported on 01/22/2020)   No facility-administered encounter medications on file as of 01/22/2020.     Objective: Blood pressure 140/75, temperature 98.7 F (37.1 C), temperature source Oral, height '5\' 6"'  (1.676 m), weight 144 lb 1.6 oz (65.4 kg). Patient is alert and in no acute distress. She is wearing a mask. Conjunctiva is pink. Sclera is nonicteric Oropharyngeal mucosa is normal. No neck masses or  thyromegaly noted. Cardiac exam with regular rhythm normal S1 and S2. No murmur or gallop noted. Lungs are clear to auscultation. Abdomen is symmetrical.  She has a right subcostal scar.  On palpation abdomen is soft and nontender with no organomegaly or masses. No LE edema or clubbing noted.  Labs/studies Results:  CBC Latest Ref Rng & Units 08/03/2016 08/02/2016 07/29/2016  WBC 4.0 - 10.5 K/uL 11.5(H) 10.2 9.2  Hemoglobin 12.0 - 15.0 g/dL 12.2 12.0 13.0  Hematocrit 36 - 46 % 37.3 37.1 39.2  Platelets 150 - 400 K/uL 229 231 259    CMP Latest Ref Rng & Units 08/20/2019 01/16/2019 08/03/2016  Glucose 65 - 139 mg/dL 146(H) 192(H) 207(H)  BUN 7 - 25 mg/dL 35(H) 26(H) 30(H)  Creatinine 0.50 - 0.99 mg/dL 1.38(H) 1.11(H) 1.20(H)  Sodium 135 - 146 mmol/L 138 139 136  Potassium 3.5 - 5.3 mmol/L 4.6 4.6 4.8  Chloride 98 - 110 mmol/L 108 105 105  CO2 20 - 32 mmol/L 23 25 20(L)  Calcium 8.6 - 10.4 mg/dL 9.8 9.4 9.4  Total Protein 6.1 - 8.1 g/dL 7.1 7.3 -  Total Bilirubin 0.2 - 1.2 mg/dL 0.4 0.4 -  Alkaline Phos 38 - 126 U/L - - -  AST 10 - 35 U/L 13 11 -  ALT 6 - 29 U/L 12 11 -    Hepatic Function Latest Ref Rng & Units 08/20/2019 01/16/2019 09/14/2015  Total Protein 6.1 - 8.1 g/dL 7.1 7.3 6.9  Albumin 3.5 - 5.0 g/dL - - 4.2  AST 10 - 35 U/L '13 11 22  ' ALT 6 - 29 U/L '12 11 24  ' Alk Phosphatase 38 - 126 U/L - - 58  Total Bilirubin 0.2 - 1.2 mg/dL 0.4 0.4 0.5    Lab studies reviewed.  Assessment:  #1.  Chronic GERD.  She is doing well with therapy.  Prescription for pantoprazole will be sent to her pharmacy.  #2.  History of IBS/diarrhea.  She is now having bowel movement every other day with sporadic diarrhea.  This may be a function of narcotic therapy for back pain.  #3.  Weight loss.  Weight loss appears to be due to anorexia resulting from narcotic therapy which is also causing her to be nauseated.   Plan:  Prescription for pantoprazole 40 mg every morning 90 doses with 3 refills sent to  patient's pharmacy. Patient advised to eat snacks between meals or she may consider supplements such as Ensure or boost in between meals. She will continue to monitor her weight and call office if she drops below 140 pounds. Office visit in 1 year. Screening colonoscopy when patient is ready.

## 2020-01-22 NOTE — Patient Instructions (Addendum)
Please call if pantoprazole stops working. Please notify if your weight drops below 140 pounds.

## 2020-02-23 ENCOUNTER — Other Ambulatory Visit: Payer: Self-pay | Admitting: "Endocrinology

## 2020-02-28 ENCOUNTER — Telehealth: Payer: Self-pay | Admitting: Cardiology

## 2020-02-28 MED ORDER — LISINOPRIL 20 MG PO TABS: 20.0000 mg | ORAL_TABLET | Freq: Every day | ORAL | 3 refills | Status: DC

## 2020-02-28 MED ORDER — ROSUVASTATIN CALCIUM 20 MG PO TABS: 20.0000 mg | ORAL_TABLET | Freq: Every day | ORAL | 3 refills | Status: DC

## 2020-02-28 NOTE — Telephone Encounter (Signed)
*  STAT* If patient is at the pharmacy, call can be transferred to refill team.   1. Which medications need to be refilled? (please list name of each medication and dose if known) rosuvastatin (CRESTOR) 20 MG tablet(Expired) lisinopril (ZESTRIL) 20 MG tablet 2. Which pharmacy/location (including street and city if local pharmacy) is medication to be sent to? [facility] on [address]   3. Do they need a 30 day or 90 day supply? [place]

## 2020-02-28 NOTE — Telephone Encounter (Signed)
Refill complete 

## 2020-03-27 LAB — COMPREHENSIVE METABOLIC PANEL
ALT: 16 IU/L (ref 0–32)
AST: 15 IU/L (ref 0–40)
Albumin/Globulin Ratio: 2 (ref 1.2–2.2)
Albumin: 4.5 g/dL (ref 3.8–4.8)
Alkaline Phosphatase: 63 IU/L (ref 44–121)
BUN/Creatinine Ratio: 23 (ref 12–28)
BUN: 30 mg/dL — ABNORMAL HIGH (ref 8–27)
Bilirubin Total: 0.4 mg/dL (ref 0.0–1.2)
CO2: 21 mmol/L (ref 20–29)
Calcium: 9.1 mg/dL (ref 8.7–10.3)
Chloride: 105 mmol/L (ref 96–106)
Creatinine, Ser: 1.28 mg/dL — ABNORMAL HIGH (ref 0.57–1.00)
GFR calc Af Amer: 51 mL/min/{1.73_m2} — ABNORMAL LOW (ref >59)
GFR calc non Af Amer: 44 mL/min/{1.73_m2} — ABNORMAL LOW (ref >59)
Globulin, Total: 2.3 g/dL (ref 1.5–4.5)
Glucose: 156 mg/dL — ABNORMAL HIGH (ref 65–99)
Potassium: 4.7 mmol/L (ref 3.5–5.2)
Sodium: 140 mmol/L (ref 134–144)
Total Protein: 6.8 g/dL (ref 6.0–8.5)

## 2020-03-27 LAB — LIPID PANEL
Chol/HDL Ratio: 2.9 ratio (ref 0.0–4.4)
Cholesterol, Total: 124 mg/dL (ref 100–199)
HDL: 43 mg/dL
LDL Chol Calc (NIH): 61 mg/dL (ref 0–99)
Triglycerides: 112 mg/dL (ref 0–149)
VLDL Cholesterol Cal: 20 mg/dL (ref 5–40)

## 2020-03-27 LAB — VITAMIN D 25 HYDROXY (VIT D DEFICIENCY, FRACTURES): Vit D, 25-Hydroxy: 55.8 ng/mL (ref 30.0–100.0)

## 2020-04-02 ENCOUNTER — Ambulatory Visit (INDEPENDENT_AMBULATORY_CARE_PROVIDER_SITE_OTHER): Payer: 59 | Admitting: Nurse Practitioner

## 2020-04-02 ENCOUNTER — Encounter: Payer: Self-pay | Admitting: Nurse Practitioner

## 2020-04-02 ENCOUNTER — Other Ambulatory Visit: Payer: Self-pay

## 2020-04-02 VITALS — BP 118/70 | HR 85 | Ht 66.0 in | Wt 143.0 lb

## 2020-04-02 DIAGNOSIS — E782 Mixed hyperlipidemia: Secondary | ICD-10-CM | POA: Diagnosis not present

## 2020-04-02 DIAGNOSIS — E559 Vitamin D deficiency, unspecified: Secondary | ICD-10-CM

## 2020-04-02 DIAGNOSIS — I10 Essential (primary) hypertension: Secondary | ICD-10-CM

## 2020-04-02 DIAGNOSIS — E1159 Type 2 diabetes mellitus with other circulatory complications: Secondary | ICD-10-CM | POA: Diagnosis not present

## 2020-04-02 LAB — POCT GLYCOSYLATED HEMOGLOBIN (HGB A1C): Hemoglobin A1C: 7.8 % — AB (ref 4.0–5.6)

## 2020-04-02 MED ORDER — GLIPIZIDE ER 2.5 MG PO TB24: 2.5000 mg | ORAL_TABLET | Freq: Every day | ORAL | 3 refills | Status: DC

## 2020-04-02 NOTE — Progress Notes (Signed)
04/02/2020, 11:35 AM                                Endocrinology follow-up note   Subjective:    Patient ID: Grace Hess, female    DOB: [birth year].  Grace Hess is being seen in follow-up for management of currently uncontrolled symptomatic diabetes requested by  Redmond School, MD.   Past Medical History:  Diagnosis Date  . Anxiety   . Arthritis    "might have a touch in my fingers" (08/02/2016)  . CAD (coronary artery disease)    a. s/p DES to RCA in 07/2016 with residual mid-LAD stenosis  . Cervicalgia   . Chest pain, unspecified   . GERD (gastroesophageal reflux disease)   . Headache    "bad ones after my stroke in 2016; only have them when I get bad news now" (08/02/2016)  . Heart murmur   . Heavy smoker (more than 20 cigarettes per day)    Scheduled for LDCT screening 02/11/15  . Hepatitis A    "related to [employer]"  . History of hiatal hernia   . History of kidney stones    "no cysto/OR" (12/10/2014)  . Hypercholesteremia   . Hypertension   . Obesity   . Reflux esophagitis   . Stroke [facility])    "they said I had a silent stroke a few months back/MRI" (12/10/2014)  . TIA (transient ischemic attack) 11/28/2014   [name] 11/28/2014  . Type II diabetes mellitus ([place])     Past Surgical History:  Procedure Laterality Date  . ABDOMINAL AORTOGRAM N/A 08/02/2016   Procedure: Abdominal Aortogram;  Surgeon: Nelva Bush, MD;  Location: [place] [facility];  Service: Cardiovascular;  Laterality: N/A;  . CAROTID STENT    . CHOLECYSTECTOMY OPEN  1978  . CORONARY ANGIOPLASTY WITH STENT PLACEMENT  08/02/2016   to RCA  . CORONARY STENT INTERVENTION N/A 08/02/2016   Procedure: Coronary Stent Intervention;  Surgeon: Nelva Bush, MD;  Location: [place] [facility];  Service: Cardiovascular;  Laterality: N/A;  RCA  . ENDARTERECTOMY Left 12/02/2014   Procedure: ENDARTERECTOMY CAROTID;   Surgeon: Rosetta Posner, MD;  Location: [place];  Service: Vascular;  Laterality: Left;  . LEFT HEART CATH AND CORONARY ANGIOGRAPHY N/A 08/02/2016   Procedure: Left Heart Cath and Coronary Angiography;  Surgeon: Nelva Bush, MD;  Location: [place] [facility];  Service: Cardiovascular;  Laterality: N/A;  . LOWER EXTREMITY ANGIOGRAM  08/02/2016  . LOWER EXTREMITY ANGIOGRAPHY N/A 08/02/2016   Procedure: Lower Extremity Angiography;  Surgeon: Nelva Bush, MD;  Location: [place] [facility];  Service: Cardiovascular;  Laterality: N/A;  . VAGINAL HYSTERECTOMY  1991   "partial"    Social History   Socioeconomic History  . Marital status: Married    Spouse name: Not on file  . Number of children: 2  . Years of education: 57  . Highest education level: Not on file  Occupational History  . Not on file  Tobacco Use  . Smoking status: Current Every  Day Smoker    Packs/day: 2.00    Years: 43.00    Pack years: 86.00    Types: Cigarettes    Start date: 05/17/1974  . Smokeless tobacco: Never Used  Vaping Use  . Vaping Use: Never used  Substance and Sexual Activity  . Alcohol use: No    Alcohol/week: 0.0 standard drinks  . Drug use: No  . Sexual activity: Never  Other Topics Concern  . Not on file  Social History Narrative   Patient does not drink caffeine.   Patient is right handed.    Social Determinants of Health   Financial Resource Strain: Not on file  Food Insecurity: Not on file  Transportation Needs: Not on file  Physical Activity: Not on file  Stress: Not on file  Social Connections: Not on file    Family History  Problem Relation Age of Onset  . Congestive Heart Failure Mother   . COPD Father   . Cancer Brother   . Cancer Brother     Outpatient Encounter Medications as of 04/02/2020  Medication Sig  . glipiZIDE (GLIPIZIDE XL) 2.5 MG 24 hr tablet Take 1 tablet (2.5 mg total) by mouth daily with breakfast.  . acetaminophen (TYLENOL) 325 MG tablet Take 2 tablets  (650 mg total) by mouth every 4 (four) hours as needed for headache or mild pain.  Marland Kitchen ALPRAZolam (XANAX) 0.5 MG tablet Take 0.5 mg by mouth 2 (two) times daily as needed.  Marland Kitchen aspirin EC 81 MG tablet Take 1 tablet (81 mg total) by mouth daily. Please continue Aspirin and plavix for 6 months, then plavix daily  . Blood Glucose Monitoring Suppl (ACCU-CHEK GUIDE) w/Device KIT 1 Piece by Does not apply route as directed.  . clopidogrel (PLAVIX) 75 MG tablet Take 1 tablet (75 mg total) by mouth daily.  Marland Kitchen dicyclomine (BENTYL) 10 MG capsule Take 1 capsule (10 mg total) by mouth 2 (two) times daily.  Marland Kitchen gabapentin (NEURONTIN) 300 MG capsule Take 300 mg by mouth 2 (two) times daily.   Marland Kitchen glucose blood (ACCU-CHEK GUIDE) test strip Use as instructed  . LEXAPRO 10 MG tablet Take 20 mg by mouth daily.   Marland Kitchen lisinopril (ZESTRIL) 20 MG tablet Take 1 tablet (20 mg total) by mouth daily.  . metFORMIN (GLUCOPHAGE) 500 MG tablet Take 1 tablet (500 mg total) by mouth 2 (two) times daily with a meal.  . metoprolol tartrate (LOPRESSOR) 25 MG tablet Take 0.5 tablets (12.5 mg total) by mouth 2 (two) times daily.  . nitroGLYCERIN (NITROSTAT) 0.4 MG SL tablet Place 1 tablet (0.4 mg total) under the tongue every 5 (five) minutes as needed for chest pain.  Marland Kitchen nystatin cream (MYCOSTATIN) Apply 1 application topically 2 (two) times daily as needed.  . ondansetron (ZOFRAN) 4 MG tablet TAKE 1 TABLET(4 MG) BY MOUTH TWICE DAILY AS NEEDED FOR NAUSEA OR VOMITING  . oxyCODONE-acetaminophen (PERCOCET/ROXICET) 5-325 MG tablet Take 1 tablet by mouth 3 (three) times daily as needed.  . pantoprazole (PROTONIX) 40 MG tablet Take 1 tablet (40 mg total) by mouth daily before breakfast.  . rosuvastatin (CRESTOR) 20 MG tablet Take 1 tablet (20 mg total) by mouth daily.  . Semaglutide,0.25 or 0.5MG/DOS, 2 MG/1.5ML SOPN Inject 0.5 mg into the skin once a week.  . Vitamin D, Ergocalciferol, (DRISDOL) 1.25 MG (50000 UNIT) CAPS capsule TAKE 1 CAPSULE BY  MOUTH EVERY 7 DAYS   No facility-administered encounter medications on file as of 04/02/2020.    ALLERGIES: Allergies  Allergen Reactions  . Codeine Shortness Of Breath  . Latex Itching  . Sulfa Antibiotics Other (See Comments)    Reaction:  Unknown     VACCINATION STATUS: Immunization History  Administered Date(s) Administered  . Influenza,inj,Quad PF,6+ Mos 11/29/2014  . Pneumococcal Polysaccharide-23 11/29/2014    Diabetes She presents for her follow-up diabetic visit. She has type 2 diabetes mellitus. Onset time: She was diagnosed at approximate age of 55 years. Her disease course has been stable. There are no hypoglycemic associated symptoms. Pertinent negatives for hypoglycemia include no confusion, headaches, pallor or seizures. Associated symptoms include foot paresthesias. Pertinent negatives for diabetes include no chest pain, no fatigue, no polydipsia, no polyphagia and no polyuria. There are no hypoglycemic complications. Symptoms are stable. Diabetic complications include a CVA, heart disease and nephropathy. Risk factors for coronary artery disease include dyslipidemia, diabetes mellitus, family history, hypertension, tobacco exposure, sedentary lifestyle and post-menopausal. Current diabetic treatment includes oral agent (monotherapy) (and Ozempic 0.5 mg weekly). She is compliant with treatment most of the time. Her weight is fluctuating minimally. She is following a generally unhealthy diet. When asked about meal planning, she reported none. She has not had a previous visit with a dietitian. She never participates in exercise. Her home blood glucose trend is fluctuating minimally. Her overall blood glucose range is 140-180 mg/dl. (She presents today with her meter, no logs, showing 7- day average of 161 (with 4 readings), 14- day average of 156 (with 7 readings), and 30-day average of 155 (with 9 readings).  She reports she does have sugary beverages in small quantities daily  (diet pepsi and peach tea) but says her diet is generally healthy otherwise.  She denies any s/s of hypoglycemia.) An Hess inhibitor/angiotensin II receptor blocker is being taken. She does not see a podiatrist.Eye exam is not current.  Hyperlipidemia This is a chronic problem. The current episode started more than 1 year ago. The problem is uncontrolled. Recent lipid tests were reviewed and are variable. Exacerbating diseases include chronic renal disease and diabetes. Factors aggravating her hyperlipidemia include beta blockers, fatty foods and smoking. Pertinent negatives include no chest pain, myalgias or shortness of breath. Current antihyperlipidemic treatment includes statins. The current treatment provides mild improvement of lipids. Compliance problems include adherence to diet and adherence to exercise.  Risk factors for coronary artery disease include diabetes mellitus, dyslipidemia, hypertension, a sedentary lifestyle, post-menopausal and family history.  Hypertension This is a chronic problem. The current episode started more than 1 year ago. The problem has been gradually improving since onset. The problem is controlled. Pertinent negatives include no chest pain, headaches, palpitations or shortness of breath. There are no associated agents to hypertension. Risk factors for coronary artery disease include dyslipidemia, diabetes mellitus, sedentary lifestyle, smoking/tobacco exposure and post-menopausal state. Past treatments include Hess inhibitors and beta blockers. The current treatment provides moderate improvement. Compliance problems include exercise and diet.  Hypertensive end-organ damage includes kidney disease, CAD/MI and CVA. Identifiable causes of hypertension include chronic renal disease.   Review of systems  Constitutional: + minimally fluctuating weight,  current Body mass index is 23.08 kg/m. , no fatigue, no subjective hyperthermia, no subjective hypothermia Eyes: no blurry  vision, no xerophthalmia ENT: no sore throat, no nodules palpated in throat, no dysphagia/odynophagia, no hoarseness Cardiovascular: no chest pain, no shortness of breath, no palpitations, no leg swelling Respiratory: no cough, no shortness of breath Gastrointestinal: no nausea/vomiting/diarrhea Musculoskeletal: no muscle/joint aches Skin: no rashes, no hyperemia Neurological: no tremors, no  dizziness, + numbness/tingling to BLE (feet) Psychiatric: no depression, no anxiety  Objective:    BP 118/70   Pulse 85   Ht '5\' 6"'  (1.676 m)   Wt 143 lb (64.9 kg)   BMI 23.08 kg/m   Wt Readings from Last 3 Encounters:  04/02/20 143 lb (64.9 kg)  01/22/20 144 lb 1.6 oz (65.4 kg)  12/26/19 143 lb 12.8 oz (65.2 kg)    BP Readings from Last 3 Encounters:  04/02/20 118/70  01/22/20 140/75  12/26/19 111/65    Physical Exam- Limited  Constitutional:  Body mass index is 23.08 kg/m. , not in acute distress, normal state of mind Eyes:  EOMI, no exophthalmos Neck: Supple Respiratory: Adequate breathing efforts Musculoskeletal: no gross deformities, strength intact in all four extremities, no gross restriction of joint movements Skin:  no rashes, no hyperemia Neurological: no tremor with outstretched hands,    CMP     Component Value Date/Time   NA 140 03/26/2020 0927   K 4.7 03/26/2020 0927   CL 105 03/26/2020 0927   CO2 21 03/26/2020 0927   GLUCOSE 156 (H) 03/26/2020 0927   GLUCOSE 146 (H) 08/20/2019 0719   BUN 30 (H) 03/26/2020 0927   CREATININE 1.28 (H) 03/26/2020 0927   CREATININE 1.38 (H) 08/20/2019 0719   CALCIUM 9.1 03/26/2020 0927   PROT 6.8 03/26/2020 0927   ALBUMIN 4.5 03/26/2020 0927   AST 15 03/26/2020 0927   ALT 16 03/26/2020 0927   ALKPHOS 63 03/26/2020 0927   BILITOT 0.4 03/26/2020 0927   GFRNONAA 44 (L) 03/26/2020 0927   GFRNONAA 40 (L) 08/20/2019 0719   GFRAA 51 (L) 03/26/2020 0927   GFRAA 47 (L) 08/20/2019 0719     Diabetic Labs (most recent): Lab Results   Component Value Date   HGBA1C 7.8 (A) 04/02/2020   HGBA1C 7.7 (A) 12/26/2019   HGBA1C 7.3 (A) 08/23/2019    Lipid Panel     Component Value Date/Time   CHOL 124 03/26/2020 0927   TRIG 112 03/26/2020 0927   HDL 43 03/26/2020 0927   CHOLHDL 2.9 03/26/2020 0927   CHOLHDL 3.6 01/16/2019 0937   VLDL UNABLE TO CALCULATE IF TRIGLYCERIDE OVER 400 mg/dL 11/29/2014 0630   LDLCALC 61 03/26/2020 0927   LDLCALC 69 01/16/2019 0937   LABVLDL 20 03/26/2020 0927       Assessment & Plan:   1) DM type 2 causing vascular disease ([place])  - Grace Hess has currently uncontrolled symptomatic type 2 DM since  65 years of age.  She presents today with her meter, no logs, showing 7- day average of 161 (with 4 readings), 14- day average of 156 (with 7 readings), and 30-day average of 155 (with 9 readings).  Her POCT A1c today is 7.8%, essentially unchanged from previous visit of 7.7%.  She reports she does have sugary beverages in small quantities daily (diet pepsi and peach tea) but says her diet is generally healthy otherwise.  She denies any s/s of hypoglycemia.  - I had a long discussion with her about the progressive nature of diabetes and the pathology behind its complications. -her diabetes is complicated by coronary artery disease, CVA, nephropathy, peripheral neuropathy, chronic heavy smoking, sedentary life and she remains at a high risk for more acute and chronic complications which include CAD, CVA, CKD, retinopathy, and neuropathy. These are all discussed in detail with her.  - Nutritional counseling repeated at each appointment due to patients tendency to fall back in to old  habits.  - The patient admits there is a room for improvement in their diet and drink choices. -  Suggestion is made for the patient to avoid simple carbohydrates from their diet including Cakes, Sweet Desserts / Pastries, Ice Cream, Soda (diet and regular), Sweet Tea, Candies, Chips, Cookies, Sweet Pastries,  Store  Bought Juices, Alcohol in Excess of  1-2 drinks a day, Artificial Sweeteners, Coffee Creamer, and "Sugar-free" Products. This will help patient to have stable blood glucose profile and potentially avoid unintended weight gain.   - I encouraged the patient to switch to  unprocessed or minimally processed complex starch and increased protein intake (animal or plant source), fruits, and vegetables.   - Patient is advised to stick to a routine mealtimes to eat 3 meals  a day and avoid unnecessary snacks ( to snack only to correct hypoglycemia).  - she will be scheduled with Jearld Fenton, RDN, CDE for diabetes education.  - I have approached her with the following individualized plan to manage  her diabetes and patient agrees:   -She is advised to continue Ozempic 0.5 mg SQ weekly (samples provided from office due to cost) and Metformin 500 mg po twice daily with meals.  -She reluctantly agrees to restart low-dose Glipizide given her recent loss of control (was hesitant to restart due to hypoglycemia in the past).  Rx for Glipizide 2.5 mg XL po daily with breakfast sent to patients pharmacy.  -She will not tolerate increase in Metformin due to slow decline in kidney function (stable at this point).  -She is encouraged to continue monitoring blood glucose daily, before breakfast, and call the clinic if she has readings less than 70 or greater than 200 for 3 tests in a row.  - Specific targets for  A1c;  LDL, HDL, Triglycerides, and  Waist Circumference were discussed with the patient.  2) Blood Pressure /Hypertension:  Her blood pressure is controlled to target.  She is advised to continue Lisinopril 20 mg po daily and Metoprolol 12.5 mg po twice daily.  3) Lipids/Hyperlipidemia:   Her most recent lipid panel from 03/26/20 shows controlled LDL of 61.  She is advised to continue Crestor 20 mg po daily at bedtime.  Side effects and precautions discussed with her.  She is advised to avoid fried foods  and butter.    4)  Weight/Diet:  Her Body mass index is 23.08 kg/m.--she has lost significant amount of weight recently.  she is not a candidate for more weight loss.   Exercise, and detailed carbohydrates information provided  -  detailed on discharge instructions.  5) Chronic Care/Health Maintenance: -she is on ACEI/ARB and Statin medications and is encouraged to initiate and continue to follow up with Ophthalmology, Dentist, Podiatrist at least yearly or according to recommendations, and advised to  quit  smoking. I have recommended yearly flu vaccine and pneumonia vaccine at least every 5 years; moderate intensity exercise for up to 150 minutes weekly; and sleep for at least 7 hours a day.  - she is advised to maintain close follow up with Redmond School, MD for primary care needs, as well as her other providers for optimal and coordinated care.  - Time spent on this patient care encounter:  30 min, of which > 50% was spent in  counseling and the rest reviewing her blood glucose logs , discussing her hypoglycemia and hyperglycemia episodes, reviewing her current and  previous labs / studies  ( including abstraction from other facilities) and medications  doses and developing a  long term treatment plan and documenting her care.   Please refer to Patient Instructions for Blood Glucose Monitoring and Insulin/Medications Dosing Guide"  in media tab for additional information. Please  also refer to " Patient Self Inventory" in the Media  tab for reviewed elements of pertinent patient history.  Grace Hess participated in the discussions, expressed understanding, and voiced agreement with the above plans.  All questions were answered to her satisfaction. she is encouraged to contact clinic should she have any questions or concerns prior to her return visit.   Follow up plan: - Return in about 4 months (around 07/31/2020) for Diabetes follow up with A1c in office, No previsit labs, Bring  glucometer and logs.  Grace Hess, [facility] [address] [place],  [address] Phone: [phone] Fax: [phone]  04/02/2020, 11:35 AM

## 2020-04-02 NOTE — Patient Instructions (Signed)
Diabetes Mellitus and Nutrition, Adult When you have diabetes, or diabetes mellitus, it is very important to have healthy eating habits because your blood sugar (glucose) levels are greatly affected by what you eat and drink. Eating healthy foods in the right amounts, at about the same times every day, can help you:  Control your blood glucose.  Lower your risk of heart disease.  Improve your blood pressure.  Reach or maintain a healthy weight. What can affect my meal plan? Every person with diabetes is different, and each person has different needs for a meal plan. Your health care provider may recommend that you work with a dietitian to make a meal plan that is best for you. Your meal plan may vary depending on factors such as:  The calories you need.  The medicines you take.  Your weight.  Your blood glucose, blood pressure, and cholesterol levels.  Your activity level.  Other health conditions you have, such as heart or kidney disease. How do carbohydrates affect me? Carbohydrates, also called carbs, affect your blood glucose level more than any other type of food. Eating carbs naturally raises the amount of glucose in your blood. Carb counting is a method for keeping track of how many carbs you eat. Counting carbs is important to keep your blood glucose at a healthy level, especially if you use insulin or take certain oral diabetes medicines. It is important to know how many carbs you can safely have in each meal. This is different for every person. Your dietitian can help you calculate how many carbs you should have at each meal and for each snack. How does alcohol affect me? Alcohol can cause a sudden decrease in blood glucose (hypoglycemia), especially if you use insulin or take certain oral diabetes medicines. Hypoglycemia can be a life-threatening condition. Symptoms of hypoglycemia, such as sleepiness, dizziness, and confusion, are similar to symptoms of having too much  alcohol.  Do not drink alcohol if: ? Your health care provider tells you not to drink. ? You are pregnant, may be pregnant, or are planning to become pregnant.  If you drink alcohol: ? Do not drink on an empty stomach. ? Limit how much you use to:  0-1 drink a day for women.  0-2 drinks a day for men. ? Be aware of how much alcohol is in your drink. In the [place] one drink equals one 12 oz bottle of beer (355 mL), one 5 oz glass of wine (148 mL), or one 1 oz glass of hard liquor (44 mL). ? Keep yourself hydrated with water, diet soda, or unsweetened iced tea.  Keep in mind that regular soda, juice, and other mixers may contain a lot of sugar and must be counted as carbs. What are tips for following this plan? Reading food labels  Start by checking the serving size on the "Nutrition Facts" label of packaged foods and drinks. The amount of calories, carbs, fats, and other nutrients listed on the label is based on one serving of the item. Many items contain more than one serving per package.  Check the total grams (g) of carbs in one serving. You can calculate the number of servings of carbs in one serving by dividing the total carbs by 15. For example, if a food has 30 g of total carbs per serving, it would be equal to 2 servings of carbs.  Check the number of grams (g) of saturated fats and trans fats in one serving. Choose foods that have   a low amount or none of these fats.  Check the number of milligrams (mg) of salt (sodium) in one serving. Most people should limit total sodium intake to less than 2,300 mg per day.  Always check the nutrition information of foods labeled as "low-fat" or "nonfat." These foods may be higher in added sugar or refined carbs and should be avoided.  Talk to your dietitian to identify your daily goals for nutrients listed on the label. Shopping  Avoid buying canned, pre-made, or processed foods. These foods tend to be high in fat, sodium, and added  sugar.  Shop around the outside edge of the grocery store. This is where you will most often find fresh fruits and vegetables, bulk grains, fresh meats, and fresh dairy. Cooking  Use low-heat cooking methods, such as baking, instead of high-heat cooking methods like deep frying.  Cook using healthy oils, such as olive, canola, or sunflower oil.  Avoid cooking with butter, cream, or high-fat meats. Meal planning  Eat meals and snacks regularly, preferably at the same times every day. Avoid going long periods of time without eating.  Eat foods that are high in fiber, such as fresh fruits, vegetables, beans, and whole grains. Talk with your dietitian about how many servings of carbs you can eat at each meal.  Eat 4-6 oz (112-168 g) of lean protein each day, such as lean meat, chicken, fish, eggs, or tofu. One ounce (oz) of lean protein is equal to: ? 1 oz (28 g) of meat, chicken, or fish. ? 1 egg. ?  cup (62 g) of tofu.  Eat some foods each day that contain healthy fats, such as avocado, nuts, seeds, and fish.   What foods should I eat? Fruits Berries. Apples. Oranges. Peaches. Apricots. Plums. Grapes. Mango. Papaya. Pomegranate. Kiwi. Cherries. Vegetables Lettuce. Spinach. Leafy greens, including kale, chard, collard greens, and mustard greens. Beets. Cauliflower. Cabbage. Broccoli. Carrots. Green beans. Tomatoes. Peppers. Onions. Cucumbers. Brussels sprouts. Grains Whole grains, such as whole-wheat or whole-grain bread, crackers, tortillas, cereal, and pasta. Unsweetened oatmeal. Quinoa. Brown or wild rice. Meats and other proteins Seafood. Poultry without skin. Lean cuts of poultry and beef. Tofu. Nuts. Seeds. Dairy Low-fat or fat-free dairy products such as milk, yogurt, and cheese. The items listed above may not be a complete list of foods and beverages you can eat. Contact a dietitian for more information. What foods should I avoid? Fruits Fruits canned with  syrup. Vegetables Canned vegetables. Frozen vegetables with butter or cream sauce. Grains Refined white flour and flour products such as bread, pasta, snack foods, and cereals. Avoid all processed foods. Meats and other proteins Fatty cuts of meat. Poultry with skin. Breaded or fried meats. Processed meat. Avoid saturated fats. Dairy Full-fat yogurt, cheese, or milk. Beverages Sweetened drinks, such as soda or iced tea. The items listed above may not be a complete list of foods and beverages you should avoid. Contact a dietitian for more information. Questions to ask a health care provider  Do I need to meet with a diabetes educator?  Do I need to meet with a dietitian?  What number can I call if I have questions?  When are the best times to check my blood glucose? Where to find more information:  American Diabetes Association: diabetes.org  Academy of Nutrition and Dietetics: www.eatright.org  National Institute of Diabetes and Digestive and Kidney Diseases: www.niddk.nih.gov  Association of Diabetes Care and Education Specialists: www.diabeteseducator.org Summary  It is important to have healthy eating   habits because your blood sugar (glucose) levels are greatly affected by what you eat and drink.  A healthy meal plan will help you control your blood glucose and maintain a healthy lifestyle.  Your health care provider may recommend that you work with a dietitian to make a meal plan that is best for you.  Keep in mind that carbohydrates (carbs) and alcohol have immediate effects on your blood glucose levels. It is important to count carbs and to use alcohol carefully. This information is not intended to replace advice given to you by your health care provider. Make sure you discuss any questions you have with your health care provider. Document Revised: 02/06/2019 Document Reviewed: 02/06/2019 Elsevier Patient Education  2021 Elsevier Inc.  

## 2020-05-20 ENCOUNTER — Other Ambulatory Visit: Payer: Self-pay | Admitting: "Endocrinology

## 2020-05-22 ENCOUNTER — Other Ambulatory Visit (HOSPITAL_COMMUNITY): Payer: Self-pay | Admitting: Family Medicine

## 2020-05-22 ENCOUNTER — Ambulatory Visit (HOSPITAL_COMMUNITY)
Admission: RE | Admit: 2020-05-22 | Discharge: 2020-05-22 | Disposition: A | Discharge status: Home / Self Care | Payer: HMO | Source: Ambulatory Visit | Attending: Family Medicine | Admitting: Family Medicine

## 2020-05-22 ENCOUNTER — Other Ambulatory Visit: Payer: Self-pay

## 2020-05-22 DIAGNOSIS — I517 Cardiomegaly: Secondary | ICD-10-CM | POA: Diagnosis not present

## 2020-05-22 DIAGNOSIS — Z681 Body mass index (BMI) 19 or less, adult: Secondary | ICD-10-CM | POA: Diagnosis not present

## 2020-05-22 DIAGNOSIS — J069 Acute upper respiratory infection, unspecified: Secondary | ICD-10-CM | POA: Diagnosis not present

## 2020-05-22 DIAGNOSIS — R053 Chronic cough: Secondary | ICD-10-CM | POA: Diagnosis not present

## 2020-05-22 DIAGNOSIS — R059 Cough, unspecified: Secondary | ICD-10-CM | POA: Diagnosis not present

## 2020-05-22 DIAGNOSIS — E119 Type 2 diabetes mellitus without complications: Secondary | ICD-10-CM | POA: Diagnosis not present

## 2020-06-04 DIAGNOSIS — J45909 Unspecified asthma, uncomplicated: Secondary | ICD-10-CM | POA: Diagnosis not present

## 2020-06-04 DIAGNOSIS — Z6823 Body mass index (BMI) 23.0-23.9, adult: Secondary | ICD-10-CM | POA: Diagnosis not present

## 2020-06-04 DIAGNOSIS — R062 Wheezing: Secondary | ICD-10-CM | POA: Diagnosis not present

## 2020-06-04 DIAGNOSIS — R0609 Other forms of dyspnea: Secondary | ICD-10-CM | POA: Diagnosis not present

## 2020-06-04 DIAGNOSIS — I739 Peripheral vascular disease, unspecified: Secondary | ICD-10-CM | POA: Diagnosis not present

## 2020-06-04 DIAGNOSIS — I10 Essential (primary) hypertension: Secondary | ICD-10-CM | POA: Diagnosis not present

## 2020-06-16 ENCOUNTER — Other Ambulatory Visit: Payer: Self-pay | Admitting: "Endocrinology

## 2020-06-18 DIAGNOSIS — D0461 Carcinoma in situ of skin of right upper limb, including shoulder: Secondary | ICD-10-CM | POA: Diagnosis not present

## 2020-07-10 DIAGNOSIS — Z79891 Long term (current) use of opiate analgesic: Secondary | ICD-10-CM | POA: Diagnosis not present

## 2020-07-10 DIAGNOSIS — F330 Major depressive disorder, recurrent, mild: Secondary | ICD-10-CM | POA: Diagnosis not present

## 2020-07-10 DIAGNOSIS — M25511 Pain in right shoulder: Secondary | ICD-10-CM | POA: Diagnosis not present

## 2020-07-10 DIAGNOSIS — F419 Anxiety disorder, unspecified: Secondary | ICD-10-CM | POA: Diagnosis not present

## 2020-07-10 DIAGNOSIS — M545 Low back pain, unspecified: Secondary | ICD-10-CM | POA: Diagnosis not present

## 2020-07-22 ENCOUNTER — Ambulatory Visit (INDEPENDENT_AMBULATORY_CARE_PROVIDER_SITE_OTHER): Payer: 59 | Admitting: Internal Medicine

## 2020-07-22 ENCOUNTER — Ambulatory Visit: Payer: 59 | Admitting: Cardiology

## 2020-07-22 NOTE — Progress Notes (Deleted)
Clinical Summary Ms. Potenza is a 65 y.o.female seen today for follow up of the following medical problems.  1.CAD - 07/2016 nuclear stress test shows small moderate intensity apical/anteroseptal defect partially reversible, tid 1.67. Overall high risk study.  - cath 07/2016 with 60-70% mid LAD, 90% prox RCA. Received DES to RCA. Recs per interventional if continued chest pain could consider FFR guided PCI of mid LAD    - occasoinal chest pressure. Occurs about 1 month, typically occurs in the evening. No consistent exertional symptoms.   2. PAD - pain in bilateral calves with walking, left greater than right. Pain with 1/2 block, worst with incline. Better with rest - ABIs report looks to be mislabled as upper extremity report. ABIs show mild to moderate disease. 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 Bilateral 2 vessel runoff. Could consider intervention if neccesary.  - had diarrhea on cilostazol. She does have a history of IBS and is followed by GI.Also had some dizziness on cilostazol, shaky, Disontionued and resolved.   3. Carotid stenosis - history of prior left CEA - history of prior stroke.  - followed by vascular   4. DM2 - followed by pcp.  5. HTN - compliant with meds. Home bp's up and down, SBP at times up 190s. At pcp visit SBP 174 -  6. Hyperlipidemia - muscle aches on atorvastatin. Crestor causes muscle aches. Tried on several others - crestor 40 caused muscle aches,she cut herself down to 36m daily and is tolerating.  - upcoming labs with pcp   Past Medical History:  Diagnosis Date  . Anxiety   . Arthritis    "might have a touch in my fingers" (08/02/2016)  . CAD (coronary artery disease)    a. s/p DES to RCA in 07/2016 with residual mid-LAD stenosis  . Cervicalgia   . Chest pain, unspecified   . GERD  (gastroesophageal reflux disease)   . Headache    "bad ones after my stroke in 2016; only have them when I get bad news now" (08/02/2016)  . Heart murmur   . Heavy smoker (more than 20 cigarettes per day)    Scheduled for LDCT screening 02/11/15  . Hepatitis A    "related to [employer]  . History of hiatal hernia   . History of kidney stones    "no cysto/OR" (12/10/2014)  . Hypercholesteremia   . Hypertension   . Obesity   . Reflux esophagitis   . Stroke ([facility]    "they said I had a silent stroke a few months back/MRI" (12/10/2014)  . TIA (transient ischemic attack) 11/28/2014   /[name]9/15/2016  . Type II diabetes mellitus (HCC)      Allergies  Allergen Reactions  . Codeine Shortness Of Breath  . Latex Itching  . Sulfa Antibiotics Other (See Comments)    Reaction:  Unknown      Current Outpatient Medications  Medication Sig Dispense Refill  . acetaminophen (TYLENOL) 325 MG tablet Take 2 tablets (650 mg total) by mouth every 4 (four) hours as needed for headache or mild pain.    .Marland KitchenALPRAZolam (XANAX) 0.5 MG tablet Take 0.5 mg by mouth 2 (two) times daily as needed.    .Marland Kitchenaspirin EC 81 MG tablet Take 1 tablet (81 mg total) by mouth daily. Please continue Aspirin and plavix for 6 months, then plavix daily 30 tablet  6  . Blood Glucose Monitoring Suppl (ACCU-CHEK GUIDE) w/Device KIT 1 Piece by Does not apply route as directed. 1 kit 0  . clopidogrel (PLAVIX) 75 MG tablet Take 1 tablet (75 mg total) by mouth daily. 30 tablet 3  . dicyclomine (BENTYL) 10 MG capsule Take 1 capsule (10 mg total) by mouth 2 (two) times daily. 60 capsule 5  . gabapentin (NEURONTIN) 300 MG capsule Take 300 mg by mouth 2 (two) times daily.     Marland Kitchen glipiZIDE (GLIPIZIDE XL) 2.5 MG 24 hr tablet Take 1 tablet (2.5 mg total) by mouth daily with breakfast. 90 tablet 3  . glucose blood (ACCU-CHEK GUIDE) test strip Use as instructed 150 each 2  . LEXAPRO 10 MG tablet Take 20 mg by mouth daily.     Marland Kitchen lisinopril  (ZESTRIL) 20 MG tablet Take 1 tablet (20 mg total) by mouth daily. 90 tablet 3  . metFORMIN (GLUCOPHAGE) 500 MG tablet Take 1 tablet (500 mg total) by mouth 2 (two) times daily with a meal. 180 tablet 1  . metoprolol tartrate (LOPRESSOR) 25 MG tablet Take 0.5 tablets (12.5 mg total) by mouth 2 (two) times daily. 90 tablet 3  . nitroGLYCERIN (NITROSTAT) 0.4 MG SL tablet Place 1 tablet (0.4 mg total) under the tongue every 5 (five) minutes as needed for chest pain. 25 tablet 3  . nystatin cream (MYCOSTATIN) Apply 1 application topically 2 (two) times daily as needed.  5  . ondansetron (ZOFRAN) 4 MG tablet TAKE 1 TABLET(4 MG) BY MOUTH TWICE DAILY AS NEEDED FOR NAUSEA OR VOMITING 20 tablet 1  . oxyCODONE-acetaminophen (PERCOCET/ROXICET) 5-325 MG tablet Take 1 tablet by mouth 3 (three) times daily as needed.    . pantoprazole (PROTONIX) 40 MG tablet Take 1 tablet (40 mg total) by mouth daily before breakfast. 90 tablet 3  . rosuvastatin (CRESTOR) 20 MG tablet Take 1 tablet (20 mg total) by mouth daily. 90 tablet 3  . Semaglutide,0.25 or 0.5MG/DOS, 2 MG/1.5ML SOPN Inject 0.5 mg into the skin once a week.    . Vitamin D, Ergocalciferol, (DRISDOL) 1.25 MG (50000 UNIT) CAPS capsule TAKE 1 CAPSULE BY MOUTH EVERY 7 DAYS 12 capsule 0   No current facility-administered medications for this visit.     Past Surgical History:  Procedure Laterality Date  . ABDOMINAL AORTOGRAM N/A 08/02/2016   Procedure: Abdominal Aortogram;  Surgeon: Nelva Bush, MD;  Location: [place] [facility];  Service: Cardiovascular;  Laterality: N/A;  . CAROTID STENT    . CHOLECYSTECTOMY OPEN  1978  . CORONARY ANGIOPLASTY WITH STENT PLACEMENT  08/02/2016   to RCA  . CORONARY STENT INTERVENTION N/A 08/02/2016   Procedure: Coronary Stent Intervention;  Surgeon: Nelva Bush, MD;  Location: [place] [facility];  Service: Cardiovascular;  Laterality: N/A;  RCA  . ENDARTERECTOMY Left 12/02/2014   Procedure: ENDARTERECTOMY CAROTID;   Surgeon: Rosetta Posner, MD;  Location: [place];  Service: Vascular;  Laterality: Left;  . LEFT HEART CATH AND CORONARY ANGIOGRAPHY N/A 08/02/2016   Procedure: Left Heart Cath and Coronary Angiography;  Surgeon: Nelva Bush, MD;  Location: [place] [facility];  Service: Cardiovascular;  Laterality: N/A;  . LOWER EXTREMITY ANGIOGRAM  08/02/2016  . LOWER EXTREMITY ANGIOGRAPHY N/A 08/02/2016   Procedure: Lower Extremity Angiography;  Surgeon: Nelva Bush, MD;  Location: [place] [facility];  Service: Cardiovascular;  Laterality: N/A;  . VAGINAL HYSTERECTOMY  1991   "partial"     Allergies  Allergen Reactions  .  Codeine Shortness Of Breath  . Latex Itching  . Sulfa Antibiotics Other (See Comments)    Reaction:  Unknown       Family History  Problem Relation Age of Onset  . Congestive Heart Failure Mother   . COPD Father   . Cancer Brother   . Cancer Brother      Social History Ms. Kossman reports that she has been smoking cigarettes. She started smoking about 46 years ago. She has a 86.00 pack-year smoking history. She has never used smokeless tobacco. Ms. Grosch reports no history of alcohol use.   Review of Systems CONSTITUTIONAL: No weight loss, fever, chills, weakness or fatigue.  HEENT: Eyes: No visual loss, blurred vision, double vision or yellow sclerae.No hearing loss, sneezing, congestion, runny nose or sore throat.  SKIN: No rash or itching.  CARDIOVASCULAR:  RESPIRATORY: No shortness of breath, cough or sputum.  GASTROINTESTINAL: No anorexia, nausea, vomiting or diarrhea. No abdominal pain or blood.  GENITOURINARY: No burning on urination, no polyuria NEUROLOGICAL: No headache, dizziness, syncope, paralysis, ataxia, numbness or tingling in the extremities. No change in bowel or bladder control.  MUSCULOSKELETAL: No muscle, back pain, joint pain or stiffness.  LYMPHATICS: No enlarged nodes. No history of splenectomy.  PSYCHIATRIC: No history of depression or  anxiety.  ENDOCRINOLOGIC: No reports of sweating, cold or heat intolerance. No polyuria or polydipsia.  Marland Kitchen   Physical Examination There were no vitals filed for this visit. There were no vitals filed for this visit.  Gen: resting comfortably, no acute distress HEENT: no scleral icterus, pupils equal round and reactive, no palptable cervical adenopathy,  CV Resp: Clear to auscultation bilaterally GI: abdomen is soft, non-tender, non-distended, normal bowel sounds, no hepatosplenomegaly MSK: extremities are warm, no edema.  Skin: warm, no rash Neuro:  no focal deficits Psych: appropriate affect   Diagnostic Studies 11/2014 echo Study Conclusions  - Left ventricle: The cavity size was normal. There was mild focal basal hypertrophy of the septum. Systolic function was normal. The estimated ejection fraction was in the range of 60% to 65%. Wall motion was normal; there were no regional wall motion abnormalities. Doppler parameters are consistent with abnormal left ventricular relaxation (grade 1 diastolic dysfunction). - Aortic valve: Trileaflet; mildly calcified leaflets. There was trivial regurgitation. - Mitral valve: Calcified annulus. - Right atrium: Central venous pressure (est): 3 mm Hg. - Atrial septum: There was increased thickness of the septum, consistent with lipomatous hypertrophy. No defect or patent foramen ovale was identified. - Tricuspid valve: There was trivial regurgitation. - Pulmonary arteries: Systolic pressure could not be accurately estimated. - Pericardium, extracardiac: A prominent pericardial fat pad was present.  Impressions:  - Mild basal septal LV hypertrophy with LVEF 60-65% and grade 1 diastolic dysfunction. Mildly calcified mitral annulus as well as mildly sclerotic aortic valve. Trivial aortic regurgitation. No obvious PFO or ASD.  07/2016 Nuclear stress  No diagnostic ST segment changes to indicate ischemia  with Lexiscan.  Small, moderate intensity, apical anteroseptal defect is partially reversible suggestive of a mild ischemic territory. Thr TID ratio is also significantly increased at 1.67 suggesting the possibility of subendocardial or balanced multivessel ischemia.  This is a high risk study based predominantly on the significantly elevated TID ratio.  Nuclear stress EF: 63%.   07/2016 cath  Prox LAD to Mid LAD lesion, 20 %stenosed.  Conclusions: 1. Significant 2 vessel coronary artery disease, including 60-70% mid LAD and 90% proximal RCA stenoses. 2. Mildly elevated left ventricular filling pressure.  3. Aortic atherosclerosis with mild to moderate bilateral common iliac artery disease. 4. 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 5. Bilateral two-vessel runoff. 6. Successful PCI to the proximal RCA with placement of a Promus Premier 2.5 x 16 mm drug-eluting stent with 0% residual stenosis and TIMI-3 flow.  Recommendations: 1. Continue dual antiplatelet therapy with aspirin and clopidogrel for at least 6 months, ideally longer. 2. If patient has recurrent chest pain, consider FFR guided PCI (likely requiring orbital atherectomy) to the mid LAD. 3. Peripheral angiography reviewed with Dr. Gwenlyn Found; endovascular intervention to left SFA could be considered in the future, if claudication persists.   12/2018 echo IMPRESSIONS    1. Left ventricular ejection fraction, by visual estimation, is 65 to  70%. The left ventricle has normal function. There is mildly increased  left ventricular hypertrophy.  2. Global right ventricle has normal systolic function.The right  ventricular size is normal. No increase in right ventricular wall  thickness.  3. Left atrial size was normal.  4. Right atrial size was normal.  5. Presence of pericardial fat pad.  6. The pericardial effusion is circumferential.  7. Trivial pericardial effusion is  present.  8. Mild aortic valve annular calcification.  9. The mitral valve is normal in structure. No evidence of mitral valve  regurgitation. No evidence of mitral stenosis.  10. The tricuspid valve is normal in structure. Tricuspid valve  regurgitation is not demonstrated.  11. The aortic valve has an indeterminant number of cusps. Aortic valve  regurgitation is not visualized. Mild aortic valve sclerosis without  stenosis.  12. There is Mild calcification of the aortic valve.  13. There is Mild thickening of the aortic valve.  14. The pulmonic valve was not well visualized. Pulmonic valve  regurgitation is not visualized.     Assessment and Plan  1. CAD - Medically manage moderate LAD lesion. If recurrent symptoms consider FFR PCI of LAD. - continue current therapy. Due to extensive CAD, PAD, and carotid disease have elected to continue DAPT.   - no consistent chest pain, continue current medical therapy.    2. PAD - continue medical therapy, f/u with vascular  3. Hyperlipidemia -tolerating crestor 24m daily, did not tolerate high dosing.  - f/u repeat labs. If LDL not <70 consider adding zetia   4. HTN - above goal based on home numbers and last pcp visit, increase lisinopril to 142mdaily - has upcoming labs with pcp including a cmet  5. Heart murmur - obtain echo       JoArnoldo LenisM.D., F.A.C.C.

## 2020-07-24 ENCOUNTER — Other Ambulatory Visit (INDEPENDENT_AMBULATORY_CARE_PROVIDER_SITE_OTHER): Payer: Self-pay

## 2020-07-24 MED ORDER — ONDANSETRON HCL 4 MG PO TABS: ORAL_TABLET | ORAL | 1 refills | Status: DC

## 2020-07-31 ENCOUNTER — Encounter: Payer: Self-pay | Admitting: Nurse Practitioner

## 2020-07-31 ENCOUNTER — Ambulatory Visit (INDEPENDENT_AMBULATORY_CARE_PROVIDER_SITE_OTHER): Payer: HMO | Admitting: Nurse Practitioner

## 2020-07-31 ENCOUNTER — Other Ambulatory Visit: Payer: Self-pay

## 2020-07-31 VITALS — BP 99/62 | HR 71 | Ht 66.0 in | Wt 148.0 lb

## 2020-07-31 DIAGNOSIS — E782 Mixed hyperlipidemia: Secondary | ICD-10-CM

## 2020-07-31 DIAGNOSIS — E1159 Type 2 diabetes mellitus with other circulatory complications: Secondary | ICD-10-CM | POA: Diagnosis not present

## 2020-07-31 DIAGNOSIS — I10 Essential (primary) hypertension: Secondary | ICD-10-CM

## 2020-07-31 DIAGNOSIS — E559 Vitamin D deficiency, unspecified: Secondary | ICD-10-CM

## 2020-07-31 LAB — POCT GLYCOSYLATED HEMOGLOBIN (HGB A1C): Hemoglobin A1C: 7.1 % — AB (ref 4.0–5.6)

## 2020-07-31 NOTE — Progress Notes (Signed)
07/31/2020, 12:40 PM                                Endocrinology follow-up note   Subjective:    Patient ID: Grace Hess, female    DOB: [birth year].  Grace Hess is being seen in follow-up for management of currently uncontrolled symptomatic diabetes requested by  Redmond School, MD.   Past Medical History:  Diagnosis Date  . Anxiety   . Arthritis    "might have a touch in my fingers" (08/02/2016)  . CAD (coronary artery disease)    a. s/p DES to RCA in 07/2016 with residual mid-LAD stenosis  . Cervicalgia   . Chest pain, unspecified   . GERD (gastroesophageal reflux disease)   . Headache    "bad ones after my stroke in 2016; only have them when I get bad news now" (08/02/2016)  . Heart murmur   . Heavy smoker (more than 20 cigarettes per day)    Scheduled for LDCT screening 02/11/15  . Hepatitis A    "related to [employer]"  . History of hiatal hernia   . History of kidney stones    "no cysto/OR" (12/10/2014)  . Hypercholesteremia   . Hypertension   . Obesity   . Reflux esophagitis   . Stroke [facility])    "they said I had a silent stroke a few months back/MRI" (12/10/2014)  . TIA (transient ischemic attack) 11/28/2014   [name] 11/28/2014  . Type II diabetes mellitus ([place])     Past Surgical History:  Procedure Laterality Date  . ABDOMINAL AORTOGRAM N/A 08/02/2016   Procedure: Abdominal Aortogram;  Surgeon: Nelva Bush, MD;  Location: [place] [facility];  Service: Cardiovascular;  Laterality: N/A;  . CAROTID STENT    . CHOLECYSTECTOMY OPEN  1978  . CORONARY ANGIOPLASTY WITH STENT PLACEMENT  08/02/2016   to RCA  . CORONARY STENT INTERVENTION N/A 08/02/2016   Procedure: Coronary Stent Intervention;  Surgeon: Nelva Bush, MD;  Location: [place] [facility];  Service: Cardiovascular;  Laterality: N/A;  RCA  . ENDARTERECTOMY Left 12/02/2014   Procedure: ENDARTERECTOMY CAROTID;   Surgeon: Rosetta Posner, MD;  Location: [place];  Service: Vascular;  Laterality: Left;  . LEFT HEART CATH AND CORONARY ANGIOGRAPHY N/A 08/02/2016   Procedure: Left Heart Cath and Coronary Angiography;  Surgeon: Nelva Bush, MD;  Location: [place] [facility];  Service: Cardiovascular;  Laterality: N/A;  . LOWER EXTREMITY ANGIOGRAM  08/02/2016  . LOWER EXTREMITY ANGIOGRAPHY N/A 08/02/2016   Procedure: Lower Extremity Angiography;  Surgeon: Nelva Bush, MD;  Location: [place] [facility];  Service: Cardiovascular;  Laterality: N/A;  . VAGINAL HYSTERECTOMY  1991   "partial"    Social History   Socioeconomic History  . Marital status: Married    Spouse name: Not on file  . Number of children: 2  . Years of education: 78  . Highest education level: Not on file  Occupational History  . Not on file  Tobacco Use  . Smoking status: Current Every  Day Smoker    Packs/day: 2.00    Years: 43.00    Pack years: 86.00    Types: Cigarettes    Start date: 05/17/1974  . Smokeless tobacco: Never Used  Vaping Use  . Vaping Use: Never used  Substance and Sexual Activity  . Alcohol use: No    Alcohol/week: 0.0 standard drinks  . Drug use: No  . Sexual activity: Never  Other Topics Concern  . Not on file  Social History Narrative   Patient does not drink caffeine.   Patient is right handed.    Social Determinants of Health   Financial Resource Strain: Not on file  Food Insecurity: Not on file  Transportation Needs: Not on file  Physical Activity: Not on file  Stress: Not on file  Social Connections: Not on file    Family History  Problem Relation Age of Onset  . Congestive Heart Failure Mother   . COPD Father   . Cancer Brother   . Cancer Brother     Outpatient Encounter Medications as of 07/31/2020  Medication Sig  . acetaminophen (TYLENOL) 325 MG tablet Take 2 tablets (650 mg total) by mouth every 4 (four) hours as needed for headache or mild pain.  Marland Kitchen ALPRAZolam (XANAX) 0.5 MG  tablet Take 0.5 mg by mouth 2 (two) times daily as needed.  Marland Kitchen aspirin EC 81 MG tablet Take 1 tablet (81 mg total) by mouth daily. Please continue Aspirin and plavix for 6 months, then plavix daily  . Blood Glucose Monitoring Suppl (ACCU-CHEK GUIDE) w/Device KIT 1 Piece by Does not apply route as directed.  . clopidogrel (PLAVIX) 75 MG tablet Take 1 tablet (75 mg total) by mouth daily.  Marland Kitchen dicyclomine (BENTYL) 10 MG capsule Take 1 capsule (10 mg total) by mouth 2 (two) times daily.  Marland Kitchen gabapentin (NEURONTIN) 300 MG capsule Take 300 mg by mouth 2 (two) times daily.   Marland Kitchen glipiZIDE (GLIPIZIDE XL) 2.5 MG 24 hr tablet Take 1 tablet (2.5 mg total) by mouth daily with breakfast.  . glucose blood (ACCU-CHEK GUIDE) test strip Use as instructed  . LEXAPRO 10 MG tablet Take 20 mg by mouth daily.   Marland Kitchen lisinopril (ZESTRIL) 20 MG tablet Take 1 tablet (20 mg total) by mouth daily.  . metFORMIN (GLUCOPHAGE) 500 MG tablet Take 1 tablet (500 mg total) by mouth 2 (two) times daily with a meal.  . metoprolol tartrate (LOPRESSOR) 25 MG tablet Take 0.5 tablets (12.5 mg total) by mouth 2 (two) times daily.  . nitroGLYCERIN (NITROSTAT) 0.4 MG SL tablet Place 1 tablet (0.4 mg total) under the tongue every 5 (five) minutes as needed for chest pain.  Marland Kitchen nystatin cream (MYCOSTATIN) Apply 1 application topically 2 (two) times daily as needed.  . ondansetron (ZOFRAN) 4 MG tablet TAKE 1 TABLET(4 MG) BY MOUTH TWICE DAILY AS NEEDED FOR NAUSEA OR VOMITING  . oxyCODONE-acetaminophen (PERCOCET/ROXICET) 5-325 MG tablet Take 1 tablet by mouth 3 (three) times daily as needed.  . pantoprazole (PROTONIX) 40 MG tablet Take 1 tablet (40 mg total) by mouth daily before breakfast.  . rosuvastatin (CRESTOR) 20 MG tablet Take 1 tablet (20 mg total) by mouth daily.  . Semaglutide,0.25 or 0.5MG/DOS, 2 MG/1.5ML SOPN Inject 0.5 mg into the skin once a week.  . Vitamin D, Ergocalciferol, (DRISDOL) 1.25 MG (50000 UNIT) CAPS capsule TAKE 1 CAPSULE BY MOUTH  EVERY 7 DAYS   No facility-administered encounter medications on file as of 07/31/2020.    ALLERGIES: Allergies  Allergen Reactions  . Codeine Shortness Of Breath  . Latex Itching  . Sulfa Antibiotics Other (See Comments)    Reaction:  Unknown     VACCINATION STATUS: Immunization History  Administered Date(s) Administered  . Influenza,inj,Quad PF,6+ Mos 11/29/2014  . Pneumococcal Polysaccharide-23 11/29/2014    Diabetes She presents for her follow-up diabetic visit. She has type 2 diabetes mellitus. Onset time: She was diagnosed at approximate age of 2 years. Her disease course has been improving. There are no hypoglycemic associated symptoms. Pertinent negatives for hypoglycemia include no confusion, headaches, pallor or seizures. Associated symptoms include foot paresthesias. Pertinent negatives for diabetes include no chest pain, no fatigue, no polydipsia, no polyphagia and no polyuria. There are no hypoglycemic complications. Symptoms are stable. Diabetic complications include a CVA, heart disease and nephropathy. Risk factors for coronary artery disease include dyslipidemia, diabetes mellitus, family history, hypertension, tobacco exposure, sedentary lifestyle and post-menopausal. Current diabetic treatment includes oral agent (dual therapy) (and Ozempic 0.5 mg weekly). She is compliant with treatment most of the time. Her weight is fluctuating minimally. She is following a generally unhealthy diet. When asked about meal planning, she reported none. She has not had a previous visit with a dietitian. She never participates in exercise. Her home blood glucose trend is fluctuating minimally. Her overall blood glucose range is 140-180 mg/dl. (She presents today with her meter, no logs, showing stable glycemic profile overall.  Her POCT A1c today is 7.1%, improving from last visit of 7.8%.  Analysis of her meter shows 7-day average of 160, 14-day average of 148, 30-day average of 124, 90-day  average of 121.  Says she has spent a lot of time with her son who was hospitalized recently which can be the reason for her recently worsened glucose readings.  She did have one incidence of low glucose which was due to meal timing (skipped lunch by accident).) An Hess inhibitor/angiotensin II receptor blocker is being taken. She does not see a podiatrist.Eye exam is not current.  Hyperlipidemia This is a chronic problem. The current episode started more than 1 year ago. The problem is controlled. Recent lipid tests were reviewed and are normal. Exacerbating diseases include chronic renal disease and diabetes. Factors aggravating her hyperlipidemia include beta blockers, fatty foods and smoking. Pertinent negatives include no chest pain, myalgias or shortness of breath. Current antihyperlipidemic treatment includes statins. The current treatment provides mild improvement of lipids. Compliance problems include adherence to diet and adherence to exercise.  Risk factors for coronary artery disease include diabetes mellitus, dyslipidemia, hypertension, a sedentary lifestyle, post-menopausal and family history.  Hypertension This is a chronic problem. The current episode started more than 1 year ago. The problem has been gradually improving since onset. The problem is controlled. Pertinent negatives include no chest pain, headaches, palpitations or shortness of breath. There are no associated agents to hypertension. Risk factors for coronary artery disease include dyslipidemia, diabetes mellitus, sedentary lifestyle, smoking/tobacco exposure and post-menopausal state. Past treatments include Hess inhibitors and beta blockers. The current treatment provides moderate improvement. Compliance problems include exercise and diet.  Hypertensive end-organ damage includes kidney disease, CAD/MI and CVA. Identifiable causes of hypertension include chronic renal disease.   Review of systems  Constitutional: + minimally  fluctuating weight,  current Body mass index is 23.89 kg/m. , no fatigue, no subjective hyperthermia, no subjective hypothermia Eyes: no blurry vision, no xerophthalmia ENT: no sore throat, no nodules palpated in throat, no dysphagia/odynophagia, no hoarseness Cardiovascular: no chest pain, no shortness  of breath, no palpitations, no leg swelling Respiratory: no cough, no shortness of breath Gastrointestinal: no nausea/vomiting/diarrhea Musculoskeletal: no muscle/joint aches Skin: no rashes, no hyperemia Neurological: no tremors, no dizziness, + numbness/tingling to BLE (feet) Psychiatric: no depression, no anxiety  Objective:    BP 99/62   Pulse 71   Ht '5\' 6"'  (1.676 m)   Wt 148 lb (67.1 kg)   BMI 23.89 kg/m   Wt Readings from Last 3 Encounters:  07/31/20 148 lb (67.1 kg)  04/02/20 143 lb (64.9 kg)  01/22/20 144 lb 1.6 oz (65.4 kg)    BP Readings from Last 3 Encounters:  07/31/20 99/62  04/02/20 118/70  01/22/20 140/75     Physical Exam- Limited  Constitutional:  Body mass index is 23.89 kg/m. , not in acute distress, normal state of mind Eyes:  EOMI, no exophthalmos Neck: Supple Cardiovascular: RRR, no murmurs, rubs, or gallops, no edema Respiratory: Adequate breathing efforts, no crackles, rales, rhonchi, or wheezing Musculoskeletal: no gross deformities, strength intact in all four extremities, no gross restriction of joint movements Skin:  no rashes, no hyperemia Neurological: no tremor with outstretched hands   CMP     Component Value Date/Time   NA 140 03/26/2020 0927   K 4.7 03/26/2020 0927   CL 105 03/26/2020 0927   CO2 21 03/26/2020 0927   GLUCOSE 156 (H) 03/26/2020 0927   GLUCOSE 146 (H) 08/20/2019 0719   BUN 30 (H) 03/26/2020 0927   CREATININE 1.28 (H) 03/26/2020 0927   CREATININE 1.38 (H) 08/20/2019 0719   CALCIUM 9.1 03/26/2020 0927   PROT 6.8 03/26/2020 0927   ALBUMIN 4.5 03/26/2020 0927   AST 15 03/26/2020 0927   ALT 16 03/26/2020 0927    ALKPHOS 63 03/26/2020 0927   BILITOT 0.4 03/26/2020 0927   GFRNONAA 44 (L) 03/26/2020 0927   GFRNONAA 40 (L) 08/20/2019 0719   GFRAA 51 (L) 03/26/2020 0927   GFRAA 47 (L) 08/20/2019 0719     Diabetic Labs (most recent): Lab Results  Component Value Date   HGBA1C 7.1 (A) 07/31/2020   HGBA1C 7.8 (A) 04/02/2020   HGBA1C 7.7 (A) 12/26/2019    Lipid Panel     Component Value Date/Time   CHOL 124 03/26/2020 0927   TRIG 112 03/26/2020 0927   HDL 43 03/26/2020 0927   CHOLHDL 2.9 03/26/2020 0927   CHOLHDL 3.6 01/16/2019 0937   VLDL UNABLE TO CALCULATE IF TRIGLYCERIDE OVER 400 mg/dL 11/29/2014 0630   LDLCALC 61 03/26/2020 0927   LDLCALC 69 01/16/2019 0937   LABVLDL 20 03/26/2020 0927       Assessment & Plan:   1) DM type 2 causing vascular disease ([place])  - Grace Hess has currently uncontrolled symptomatic type 2 DM since  65 years of age.  She presents today with her meter, no logs, showing stable glycemic profile overall.  Her POCT A1c today is 7.1%, improving from last visit of 7.8%.  Analysis of her meter shows 7-day average of 160, 14-day average of 148, 30-day average of 124, 90-day average of 121.  Says she has spent a lot of time with her son who was hospitalized recently which can be the reason for her recently worsened glucose readings.  She did have one incidence of low glucose which was due to meal timing (skipped lunch by accident).  - I had a long discussion with her about the progressive nature of diabetes and the pathology behind its complications. -her diabetes is complicated by coronary  artery disease, CVA, nephropathy, peripheral neuropathy, chronic heavy smoking, sedentary life and she remains at a high risk for more acute and chronic complications which include CAD, CVA, CKD, retinopathy, and neuropathy. These are all discussed in detail with her.  - Nutritional counseling repeated at each appointment due to patients tendency to fall back in to old  habits.  - The patient admits there is a room for improvement in their diet and drink choices. -  Suggestion is made for the patient to avoid simple carbohydrates from their diet including Cakes, Sweet Desserts / Pastries, Ice Cream, Soda (diet and regular), Sweet Tea, Candies, Chips, Cookies, Sweet Pastries, Store Bought Juices, Alcohol in Excess of 1-2 drinks a day, Artificial Sweeteners, Coffee Creamer, and "Sugar-free" Products. This will help patient to have stable blood glucose profile and potentially avoid unintended weight gain.   - I encouraged the patient to switch to unprocessed or minimally processed complex starch and increased protein intake (animal or plant source), fruits, and vegetables.   - Patient is advised to stick to a routine mealtimes to eat 3 meals a day and avoid unnecessary snacks (to snack only to correct hypoglycemia).  - I have approached her with the following individualized plan to manage  her diabetes and patient agrees:   -Based on her stable glycemic profile, no changes will be made to her medications today.  She is advised to continue Ozempic 0.5 mg SQ weekly (samples provided from office due to cost) and Metformin 500 mg po twice daily with meals and Glipizide 2.5 mg XL daily with breakfast (does not tolerate higher doses due to hypoglycemia).  -She will not tolerate increase in Metformin due to slow decline in kidney function (stable at this point).  -She is encouraged to continue monitoring blood glucose daily, before breakfast, and call the clinic if she has readings less than 70 or greater than 200 for 3 tests in a row.  - Specific targets for  A1c;  LDL, HDL, Triglycerides, and  Waist Circumference were discussed with the patient.  2) Blood Pressure /Hypertension:  Her blood pressure is controlled to target.  She is advised to continue Lisinopril 20 mg po daily and Metoprolol 12.5 mg po twice daily.  3) Lipids/Hyperlipidemia:   Her most recent lipid  panel from 03/26/20 shows controlled LDL of 61.  She is advised to continue Crestor 20 mg po daily at bedtime.  Side effects and precautions discussed with her.  She is advised to avoid fried foods and butter.    4)  Weight/Diet:  Her Body mass index is 23.89 kg/m.--she has lost significant amount of weight recently.  she is not a candidate for more weight loss.   Exercise, and detailed carbohydrates information provided  -  detailed on discharge instructions.  5) Chronic Care/Health Maintenance: -she is on ACEI/ARB and Statin medications and is encouraged to initiate and continue to follow up with Ophthalmology, Dentist, Podiatrist at least yearly or according to recommendations, and advised to  quit smoking. I have recommended yearly flu vaccine and pneumonia vaccine at least every 5 years; moderate intensity exercise for up to 150 minutes weekly; and sleep for at least 7 hours a day.  - she is advised to maintain close follow up with Redmond School, MD for primary care needs, as well as her other providers for optimal and coordinated care.    I spent 42 minutes in the care of the patient today including review of labs from CMP, Lipids, Thyroid Function,  Hematology (current and previous including abstractions from other facilities); face-to-face time discussing  her blood glucose readings/logs, discussing hypoglycemia and hyperglycemia episodes and symptoms, medications doses, her options of short and long term treatment based on the latest standards of care / guidelines;  discussion about incorporating lifestyle medicine;  and documenting the encounter.    Please refer to Patient Instructions for Blood Glucose Monitoring and Insulin/Medications Dosing Guide"  in media tab for additional information. Please  also refer to " Patient Self Inventory" in the Media  tab for reviewed elements of pertinent patient history.  Grace Hess participated in the discussions, expressed understanding, and  voiced agreement with the above plans.  All questions were answered to her satisfaction. she is encouraged to contact clinic should she have any questions or concerns prior to her return visit.   Follow up plan: - Return in about 4 months (around 12/01/2020) for Diabetes F/U- A1c and UM in office, Previsit labs, Bring meter and logs.  Grace Hess, [facility] [address] [place], [place] [address] Phone: [phone] Fax: [phone]  07/31/2020, 12:40 PM

## 2020-07-31 NOTE — Patient Instructions (Signed)

## 2020-09-14 ENCOUNTER — Other Ambulatory Visit: Payer: Self-pay | Admitting: "Endocrinology

## 2020-10-02 DIAGNOSIS — Z79891 Long term (current) use of opiate analgesic: Secondary | ICD-10-CM | POA: Diagnosis not present

## 2020-10-02 DIAGNOSIS — F330 Major depressive disorder, recurrent, mild: Secondary | ICD-10-CM | POA: Diagnosis not present

## 2020-10-02 DIAGNOSIS — M25511 Pain in right shoulder: Secondary | ICD-10-CM | POA: Diagnosis not present

## 2020-10-02 DIAGNOSIS — M545 Low back pain, unspecified: Secondary | ICD-10-CM | POA: Diagnosis not present

## 2020-10-02 DIAGNOSIS — F419 Anxiety disorder, unspecified: Secondary | ICD-10-CM | POA: Diagnosis not present

## 2020-10-27 ENCOUNTER — Other Ambulatory Visit: Payer: Self-pay | Admitting: Nurse Practitioner

## 2020-10-27 MED ORDER — SEMAGLUTIDE(0.25 OR 0.5MG/DOS) 2 MG/1.5ML ~~LOC~~ SOPN: 0.5000 mg | PEN_INJECTOR | SUBCUTANEOUS | 3 refills | Status: DC

## 2020-11-20 ENCOUNTER — Other Ambulatory Visit (HOSPITAL_COMMUNITY): Payer: Self-pay | Admitting: Internal Medicine

## 2020-11-20 DIAGNOSIS — M25561 Pain in right knee: Secondary | ICD-10-CM

## 2020-11-24 ENCOUNTER — Other Ambulatory Visit (HOSPITAL_COMMUNITY): Payer: Self-pay | Admitting: Internal Medicine

## 2020-11-24 DIAGNOSIS — E2839 Other primary ovarian failure: Secondary | ICD-10-CM

## 2020-11-25 ENCOUNTER — Other Ambulatory Visit: Payer: Self-pay

## 2020-11-25 ENCOUNTER — Ambulatory Visit (HOSPITAL_COMMUNITY)
Admission: RE | Admit: 2020-11-25 | Discharge: 2020-11-25 | Disposition: A | Discharge status: Home / Self Care | Payer: HMO | Source: Ambulatory Visit | Attending: Internal Medicine | Admitting: Internal Medicine

## 2020-11-25 DIAGNOSIS — M25561 Pain in right knee: Secondary | ICD-10-CM | POA: Diagnosis not present

## 2020-11-25 DIAGNOSIS — E1159 Type 2 diabetes mellitus with other circulatory complications: Secondary | ICD-10-CM | POA: Diagnosis not present

## 2020-11-26 LAB — COMPREHENSIVE METABOLIC PANEL
ALT: 21 IU/L (ref 0–32)
AST: 14 IU/L (ref 0–40)
Albumin/Globulin Ratio: 2.2 (ref 1.2–2.2)
Albumin: 4.6 g/dL (ref 3.8–4.8)
Alkaline Phosphatase: 56 IU/L (ref 44–121)
BUN/Creatinine Ratio: 22 (ref 12–28)
BUN: 34 mg/dL — ABNORMAL HIGH (ref 8–27)
Bilirubin Total: 0.3 mg/dL (ref 0.0–1.2)
CO2: 19 mmol/L — ABNORMAL LOW (ref 20–29)
Calcium: 9.6 mg/dL (ref 8.7–10.3)
Chloride: 102 mmol/L (ref 96–106)
Creatinine, Ser: 1.54 mg/dL — ABNORMAL HIGH (ref 0.57–1.00)
Globulin, Total: 2.1 g/dL (ref 1.5–4.5)
Glucose: 159 mg/dL — ABNORMAL HIGH (ref 65–99)
Potassium: 4.7 mmol/L (ref 3.5–5.2)
Sodium: 142 mmol/L (ref 134–144)
Total Protein: 6.7 g/dL (ref 6.0–8.5)
eGFR: 37 mL/min/{1.73_m2} — ABNORMAL LOW (ref >59)

## 2020-12-01 ENCOUNTER — Ambulatory Visit: Payer: HMO | Admitting: Nurse Practitioner

## 2020-12-01 ENCOUNTER — Other Ambulatory Visit: Payer: Self-pay

## 2020-12-01 ENCOUNTER — Encounter: Payer: Self-pay | Admitting: Nurse Practitioner

## 2020-12-01 VITALS — BP 93/51 | HR 71 | Ht 66.0 in | Wt 143.4 lb

## 2020-12-01 DIAGNOSIS — E782 Mixed hyperlipidemia: Secondary | ICD-10-CM | POA: Diagnosis not present

## 2020-12-01 DIAGNOSIS — E1159 Type 2 diabetes mellitus with other circulatory complications: Secondary | ICD-10-CM

## 2020-12-01 DIAGNOSIS — E559 Vitamin D deficiency, unspecified: Secondary | ICD-10-CM

## 2020-12-01 DIAGNOSIS — I10 Essential (primary) hypertension: Secondary | ICD-10-CM

## 2020-12-01 LAB — POCT GLYCOSYLATED HEMOGLOBIN (HGB A1C): Hemoglobin A1C: 6.6 % — AB (ref 4.0–5.6)

## 2020-12-01 NOTE — Patient Instructions (Signed)

## 2020-12-01 NOTE — Progress Notes (Signed)
12/01/2020, 11:20 AM                                Endocrinology follow-up note   Subjective:    Patient ID: Grace Hess, female    DOB: [birth year].  Grace Hess is being seen in follow-up for management of currently uncontrolled symptomatic diabetes requested by  Redmond School, MD.   Past Medical History:  Diagnosis Date   Anxiety    Arthritis    "might have a touch in my fingers" (08/02/2016)   CAD (coronary artery disease)    a. s/p DES to RCA in 07/2016 with residual mid-LAD stenosis   Cervicalgia    Chest pain, unspecified    GERD (gastroesophageal reflux disease)    Headache    "bad ones after my stroke in 2016; only have them when I get bad news now" (08/02/2016)   Heart murmur    Heavy smoker (more than 20 cigarettes per day)    Scheduled for LDCT screening 02/11/15   Hepatitis A    "related to Janine Limbo"   History of hiatal hernia    History of kidney stones    "no cysto/OR" (12/10/2014)   Hypercholesteremia    Hypertension    Obesity    Reflux esophagitis    Stroke [facility])    "they said I had a silent stroke a few months back/MRI" (12/10/2014)   TIA (transient ischemic attack) 11/28/2014   Grace Hess 11/28/2014   Type II diabetes mellitus ([place])     Past Surgical History:  Procedure Laterality Date   ABDOMINAL AORTOGRAM N/A 08/02/2016   Procedure: Abdominal Aortogram;  Surgeon: Nelva Bush, MD;  Location: [place] [facility];  Service: Cardiovascular;  Laterality: N/A;   CAROTID STENT     CHOLECYSTECTOMY OPEN  1978   CORONARY ANGIOPLASTY WITH STENT PLACEMENT  08/02/2016   to RCA   CORONARY STENT INTERVENTION N/A 08/02/2016   Procedure: Coronary Stent Intervention;  Surgeon: Nelva Bush, MD;  Location: [place] [facility];  Service: Cardiovascular;  Laterality: N/A;  RCA   ENDARTERECTOMY Left 12/02/2014   Procedure: ENDARTERECTOMY CAROTID;  Surgeon: Rosetta Posner,  MD;  Location: [place];  Service: Vascular;  Laterality: Left;   LEFT HEART CATH AND CORONARY ANGIOGRAPHY N/A 08/02/2016   Procedure: Left Heart Cath and Coronary Angiography;  Surgeon: Nelva Bush, MD;  Location: [place] [facility];  Service: Cardiovascular;  Laterality: N/A;   LOWER EXTREMITY ANGIOGRAM  08/02/2016   LOWER EXTREMITY ANGIOGRAPHY N/A 08/02/2016   Procedure: Lower Extremity Angiography;  Surgeon: Nelva Bush, MD;  Location: [place] [facility];  Service: Cardiovascular;  Laterality: N/A;   VAGINAL HYSTERECTOMY  1991   "partial"    Social History   Socioeconomic History   Marital status: Married    Spouse name: Not on file   Number of children: 2   Years of education: 12   Highest education level: Not on file  Occupational History   Not on file  Tobacco Use   Smoking status: Every Day  Packs/day: 2.00    Years: 43.00    Pack years: 86.00    Types: Cigarettes    Start date: 05/17/1974   Smokeless tobacco: Never  Vaping Use   Vaping Use: Never used  Substance and Sexual Activity   Alcohol use: No    Alcohol/week: 0.0 standard drinks   Drug use: No   Sexual activity: Never  Other Topics Concern   Not on file  Social History Narrative   Patient does not drink caffeine.   Patient is right handed.    Social Determinants of Health   Financial Resource Strain: Not on file  Food Insecurity: Not on file  Transportation Needs: Not on file  Physical Activity: Not on file  Stress: Not on file  Social Connections: Not on file    Family History  Problem Relation Age of Onset   Congestive Heart Failure Mother    COPD Father    Cancer Brother    Cancer Brother     Outpatient Encounter Medications as of 12/01/2020  Medication Sig   acetaminophen (TYLENOL) 325 MG tablet Take 2 tablets (650 mg total) by mouth every 4 (four) hours as needed for headache or mild pain.   albuterol (VENTOLIN HFA) 108 (90 Base) MCG/ACT inhaler albuterol sulfate HFA 90  mcg/actuation aerosol inhaler  INHALE ONE TO 2 PUFFS BY INHALATION EVERY FOUR HOURS AS NEEDED   ALPRAZolam (XANAX) 0.5 MG tablet Take 0.5 mg by mouth 2 (two) times daily as needed.   aspirin EC 81 MG tablet Take 1 tablet (81 mg total) by mouth daily. Please continue Aspirin and plavix for 6 months, then plavix daily   Blood Glucose Monitoring Suppl (ACCU-CHEK GUIDE) w/Device KIT 1 Piece by Does not apply route as directed.   clopidogrel (PLAVIX) 75 MG tablet Take 1 tablet (75 mg total) by mouth daily.   dicyclomine (BENTYL) 10 MG capsule Take 1 capsule (10 mg total) by mouth 2 (two) times daily.   escitalopram (LEXAPRO) 5 MG tablet Take by mouth.   gabapentin (NEURONTIN) 300 MG capsule Take 300 mg by mouth 2 (two) times daily.    glipiZIDE (GLIPIZIDE XL) 2.5 MG 24 hr tablet Take 1 tablet (2.5 mg total) by mouth daily with breakfast.   glucose blood (ACCU-CHEK GUIDE) test strip Use as instructed   LEXAPRO 10 MG tablet Take 20 mg by mouth daily.    lisinopril (ZESTRIL) 20 MG tablet Take 1 tablet (20 mg total) by mouth daily.   metoprolol tartrate (LOPRESSOR) 25 MG tablet Take 0.5 tablets (12.5 mg total) by mouth 2 (two) times daily.   nitroGLYCERIN (NITROSTAT) 0.4 MG SL tablet Place 1 tablet (0.4 mg total) under the tongue every 5 (five) minutes as needed for chest pain.   nystatin cream (MYCOSTATIN) Apply 1 application topically 2 (two) times daily as needed.   ondansetron (ZOFRAN) 4 MG tablet TAKE 1 TABLET(4 MG) BY MOUTH TWICE DAILY AS NEEDED FOR NAUSEA OR VOMITING   oxyCODONE-acetaminophen (PERCOCET/ROXICET) 5-325 MG tablet Take 1 tablet by mouth 3 (three) times daily as needed.   pantoprazole (PROTONIX) 40 MG tablet Take 1 tablet (40 mg total) by mouth daily before breakfast.   Semaglutide,0.25 or 0.5MG/DOS, 2 MG/1.5ML SOPN Inject 0.5 mg into the skin once a week.   Vitamin D, Ergocalciferol, (DRISDOL) 1.25 MG (50000 UNIT) CAPS capsule TAKE 1 CAPSULE BY MOUTH EVERY 7 DAYS   [DISCONTINUED]  metFORMIN (GLUCOPHAGE) 500 MG tablet Take 1 tablet (500 mg total) by mouth 2 (two) times daily  with a meal.   rosuvastatin (CRESTOR) 20 MG tablet Take 1 tablet (20 mg total) by mouth daily.   No facility-administered encounter medications on file as of 12/01/2020.    ALLERGIES: Allergies  Allergen Reactions   Codeine Shortness Of Breath   Latex Itching   Sulfa Antibiotics Other (See Comments)    Reaction:  Unknown     VACCINATION STATUS: Immunization History  Administered Date(s) Administered   Influenza,inj,Quad PF,6+ Mos 11/29/2014   Pneumococcal Polysaccharide-23 11/29/2014    Diabetes She presents for her follow-up diabetic visit. She has type 2 diabetes mellitus. Onset time: She was diagnosed at approximate age of 61 years. Her disease course has been improving. There are no hypoglycemic associated symptoms. Pertinent negatives for hypoglycemia include no confusion, headaches, pallor or seizures. Associated symptoms include foot paresthesias. Pertinent negatives for diabetes include no chest pain, no fatigue, no polydipsia, no polyphagia and no polyuria. There are no hypoglycemic complications. Symptoms are stable. Diabetic complications include a CVA, heart disease and nephropathy. Risk factors for coronary artery disease include dyslipidemia, diabetes mellitus, family history, hypertension, tobacco exposure, sedentary lifestyle and post-menopausal. Current diabetic treatment includes oral agent (dual therapy) (and Ozempic 0.5 mg weekly). She is compliant with treatment most of the time. Her weight is fluctuating minimally. She is following a generally healthy diet. When asked about meal planning, she reported none. She has not had a previous visit with a dietitian. She never participates in exercise. Her home blood glucose trend is decreasing steadily. Her breakfast blood glucose range is generally 90-110 mg/dl. (She presents today with her meter and logs showing at goal fasting glycemic  profile.  Her POCT A1c today is 6.6%, improving from last visit of 7.1%.  She denies any hypoglycemia.  She recently lost her remaining son due to complications from kidney failure and sepsis from pneumonia.) An Hess inhibitor/angiotensin II receptor blocker is being taken. She does not see a podiatrist.Eye exam is not current.  Hyperlipidemia This is a chronic problem. The current episode started more than 1 year ago. The problem is controlled. Recent lipid tests were reviewed and are normal. Exacerbating diseases include chronic renal disease and diabetes. Factors aggravating her hyperlipidemia include beta blockers, fatty foods and smoking. Pertinent negatives include no chest pain, myalgias or shortness of breath. Current antihyperlipidemic treatment includes statins. The current treatment provides mild improvement of lipids. Compliance problems include adherence to diet and adherence to exercise.  Risk factors for coronary artery disease include diabetes mellitus, dyslipidemia, hypertension, a sedentary lifestyle, post-menopausal and family history.  Hypertension This is a chronic problem. The current episode started more than 1 year ago. The problem has been gradually improving since onset. The problem is controlled. Pertinent negatives include no chest pain, headaches, palpitations or shortness of breath. There are no associated agents to hypertension. Risk factors for coronary artery disease include dyslipidemia, diabetes mellitus, sedentary lifestyle, smoking/tobacco exposure and post-menopausal state. Past treatments include Hess inhibitors and beta blockers. The current treatment provides moderate improvement. Compliance problems include exercise and diet.  Hypertensive end-organ damage includes kidney disease, CAD/MI and CVA. Identifiable causes of hypertension include chronic renal disease.   Review of systems  Constitutional: + minimally fluctuating weight,  current Body mass index is 23.89  kg/m. , no fatigue, no subjective hyperthermia, no subjective hypothermia Eyes: no blurry vision, no xerophthalmia ENT: no sore throat, no nodules palpated in throat, no dysphagia/odynophagia, no hoarseness Cardiovascular: no chest pain, no shortness of breath, no palpitations, no leg swelling Respiratory:  no cough, no shortness of breath Gastrointestinal: no nausea/vomiting/diarrhea Musculoskeletal: no muscle/joint aches Skin: no rashes, no hyperemia Neurological: no tremors, no dizziness, + numbness/tingling to BLE (feet) Psychiatric: no depression, no anxiety  Objective:    BP (!) 93/51   Pulse 71   Ht '5\' 6"'  (1.676 m)   Wt 143 lb 6.4 oz (65 kg)   BMI 23.15 kg/m   Wt Readings from Last 3 Encounters:  12/01/20 143 lb 6.4 oz (65 kg)  07/31/20 148 lb (67.1 kg)  04/02/20 143 lb (64.9 kg)    BP Readings from Last 3 Encounters:  12/01/20 (!) 93/51  07/31/20 99/62  04/02/20 118/70     Physical Exam- Limited  Constitutional:  Body mass index is 23.15 kg/m. , not in acute distress, normal state of mind Eyes:  EOMI, no exophthalmos Neck: Supple Cardiovascular: RRR, no murmurs, rubs, or gallops, no edema Respiratory: Adequate breathing efforts, no crackles, rales, rhonchi, or wheezing Musculoskeletal: no gross deformities, strength intact in all four extremities, no gross restriction of joint movements Skin:  no rashes, no hyperemia Neurological: no tremor with outstretched hands   CMP     Component Value Date/Time   NA 142 11/25/2020 0902   K 4.7 11/25/2020 0902   CL 102 11/25/2020 0902   CO2 19 (L) 11/25/2020 0902   GLUCOSE 159 (H) 11/25/2020 0902   GLUCOSE 146 (H) 08/20/2019 0719   BUN 34 (H) 11/25/2020 0902   CREATININE 1.54 (H) 11/25/2020 0902   CREATININE 1.38 (H) 08/20/2019 0719   CALCIUM 9.6 11/25/2020 0902   PROT 6.7 11/25/2020 0902   ALBUMIN 4.6 11/25/2020 0902   AST 14 11/25/2020 0902   ALT 21 11/25/2020 0902   ALKPHOS 56 11/25/2020 0902   BILITOT 0.3  11/25/2020 0902   GFRNONAA 44 (L) 03/26/2020 0927   GFRNONAA 40 (L) 08/20/2019 0719   GFRAA 51 (L) 03/26/2020 0927   GFRAA 47 (L) 08/20/2019 0719     Diabetic Labs (most recent): Lab Results  Component Value Date   HGBA1C 6.6 (A) 12/01/2020   HGBA1C 7.1 (A) 07/31/2020   HGBA1C 7.8 (A) 04/02/2020    Lipid Panel     Component Value Date/Time   CHOL 124 03/26/2020 0927   TRIG 112 03/26/2020 0927   HDL 43 03/26/2020 0927   CHOLHDL 2.9 03/26/2020 0927   CHOLHDL 3.6 01/16/2019 0937   VLDL UNABLE TO CALCULATE IF TRIGLYCERIDE OVER 400 mg/dL 11/29/2014 0630   LDLCALC 61 03/26/2020 0927   LDLCALC 69 01/16/2019 0937   LABVLDL 20 03/26/2020 0927       Assessment & Plan:   1) DM type 2 causing vascular disease ([place])  - Grace Hess has currently uncontrolled symptomatic type 2 DM since  65 years of age.  She presents today with her meter and logs showing at goal fasting glycemic profile.  Her POCT A1c today is 6.6%, improving from last visit of 7.1%.  She denies any hypoglycemia.  She recently lost her remaining son due to complications from kidney failure and sepsis from pneumonia.  -Recent labs reviewed showing worsening kidney function.  - I had a long discussion with her about the progressive nature of diabetes and the pathology behind its complications. -her diabetes is complicated by coronary artery disease, CVA, nephropathy, peripheral neuropathy, chronic heavy smoking, sedentary life and she remains at a high risk for more acute and chronic complications which include CAD, CVA, CKD, retinopathy, and neuropathy. These are all discussed in detail with her.  -  Nutritional counseling repeated at each appointment due to patients tendency to fall back in to old habits.  - The patient admits there is a room for improvement in their diet and drink choices. -  Suggestion is made for the patient to avoid simple carbohydrates from their diet including Cakes, Sweet Desserts /  Pastries, Ice Cream, Soda (diet and regular), Sweet Tea, Candies, Chips, Cookies, Sweet Pastries, Store Bought Juices, Alcohol in Excess of 1-2 drinks a day, Artificial Sweeteners, Coffee Creamer, and "Sugar-free" Products. This will help patient to have stable blood glucose profile and potentially avoid unintended weight gain.   - I encouraged the patient to switch to unprocessed or minimally processed complex starch and increased protein intake (animal or plant source), fruits, and vegetables.   - Patient is advised to stick to a routine mealtimes to eat 3 meals a day and avoid unnecessary snacks (to snack only to correct hypoglycemia).  - I have approached her with the following individualized plan to manage  her diabetes and patient agrees:   -Given her continued decline in kidney function, I advised her to stop her Metformin for now.  She can continue her Glipizide 2.5 mg XL daily with breakfast and Ozempic 0.5 mg SQ weekly (samples provided from office) for now.  May need to increase her Glipizide if blood glucose rebounds now that the Metformin was stopped.  -She is encouraged to continue monitoring blood glucose daily, before breakfast, and call the clinic if she has readings less than 70 or greater than 200 for 3 tests in a row.  - Specific targets for  A1c;  LDL, HDL, Triglycerides, and  Waist Circumference were discussed with the patient.  2) Blood Pressure /Hypertension:  Her blood pressure is controlled to target.  She is advised to continue Lisinopril 20 mg po daily and Metoprolol 12.5 mg po twice daily.  3) Lipids/Hyperlipidemia:   Her most recent lipid panel from 03/26/20 shows controlled LDL of 61.  She is advised to continue Crestor 20 mg po daily at bedtime.  Side effects and precautions discussed with her.  She is advised to avoid fried foods and butter.  Will recheck lipid panel prior to next visit.  4)  Weight/Diet:  Her Body mass index is 23.15 kg/m.--she has lost  significant amount of weight recently.  she is not a candidate for more weight loss.   Exercise, and detailed carbohydrates information provided  -  detailed on discharge instructions.  5) Chronic Care/Health Maintenance: -she is on ACEI/ARB and Statin medications and is encouraged to initiate and continue to follow up with Ophthalmology, Dentist, Podiatrist at least yearly or according to recommendations, and advised to quit smoking. I have recommended yearly flu vaccine and pneumonia vaccine at least every 5 years; moderate intensity exercise for up to 150 minutes weekly; and sleep for at least 7 hours a day.  - she is advised to maintain close follow up with Redmond School, MD for primary care needs, as well as her other providers for optimal and coordinated care.     I spent 30 minutes in the care of the patient today including review of labs from [place], Lipids, Thyroid Function, Hematology (current and previous including abstractions from other facilities); face-to-face time discussing  her blood glucose readings/logs, discussing hypoglycemia and hyperglycemia episodes and symptoms, medications doses, her options of short and long term treatment based on the latest standards of care / guidelines;  discussion about incorporating lifestyle medicine;  and documenting the encounter.  Please refer to Patient Instructions for Blood Glucose Monitoring and Insulin/Medications Dosing Guide"  in media tab for additional information. Please  also refer to " Patient Self Inventory" in the Media  tab for reviewed elements of pertinent patient history.  Grace Hess participated in the discussions, expressed understanding, and voiced agreement with the above plans.  All questions were answered to her satisfaction. she is encouraged to contact clinic should she have any questions or concerns prior to her return visit.   Follow up plan: - Return in about 4 months (around 04/02/2021) for Diabetes F/U- A1c  and UM in office, Previsit labs, Bring meter and logs.  Grace Hess, [facility] [address] [place], [place] [address] Phone: [phone] Fax: [phone]  12/01/2020, 11:20 AM

## 2020-12-03 ENCOUNTER — Other Ambulatory Visit: Payer: Self-pay | Admitting: "Endocrinology

## 2020-12-04 ENCOUNTER — Ambulatory Visit (INDEPENDENT_AMBULATORY_CARE_PROVIDER_SITE_OTHER): Payer: HMO | Admitting: Gastroenterology

## 2020-12-10 ENCOUNTER — Other Ambulatory Visit: Payer: Self-pay | Admitting: Cardiology

## 2020-12-10 ENCOUNTER — Other Ambulatory Visit: Payer: Self-pay

## 2020-12-10 ENCOUNTER — Emergency Department (HOSPITAL_COMMUNITY)
Admission: EM | Admit: 2020-12-10 | Discharge: 2020-12-10 | Disposition: A | Discharge status: Home / Self Care | Payer: HMO | Attending: Emergency Medicine | Admitting: Emergency Medicine

## 2020-12-10 ENCOUNTER — Encounter (HOSPITAL_COMMUNITY): Payer: Self-pay | Admitting: Emergency Medicine

## 2020-12-10 ENCOUNTER — Emergency Department (HOSPITAL_COMMUNITY): Payer: HMO

## 2020-12-10 DIAGNOSIS — Z7984 Long term (current) use of oral hypoglycemic drugs: Secondary | ICD-10-CM | POA: Insufficient documentation

## 2020-12-10 DIAGNOSIS — S0033XA Contusion of nose, initial encounter: Secondary | ICD-10-CM | POA: Insufficient documentation

## 2020-12-10 DIAGNOSIS — W08XXXA Fall from other furniture, initial encounter: Secondary | ICD-10-CM | POA: Insufficient documentation

## 2020-12-10 DIAGNOSIS — F17210 Nicotine dependence, cigarettes, uncomplicated: Secondary | ICD-10-CM | POA: Diagnosis not present

## 2020-12-10 DIAGNOSIS — H538 Other visual disturbances: Secondary | ICD-10-CM | POA: Diagnosis not present

## 2020-12-10 DIAGNOSIS — S0012XA Contusion of left eyelid and periocular area, initial encounter: Secondary | ICD-10-CM | POA: Insufficient documentation

## 2020-12-10 DIAGNOSIS — E119 Type 2 diabetes mellitus without complications: Secondary | ICD-10-CM | POA: Insufficient documentation

## 2020-12-10 DIAGNOSIS — Z79899 Other long term (current) drug therapy: Secondary | ICD-10-CM | POA: Diagnosis not present

## 2020-12-10 DIAGNOSIS — Z794 Long term (current) use of insulin: Secondary | ICD-10-CM | POA: Insufficient documentation

## 2020-12-10 DIAGNOSIS — S0011XA Contusion of right eyelid and periocular area, initial encounter: Secondary | ICD-10-CM | POA: Insufficient documentation

## 2020-12-10 DIAGNOSIS — S0990XA Unspecified injury of head, initial encounter: Secondary | ICD-10-CM

## 2020-12-10 DIAGNOSIS — S8010XA Contusion of unspecified lower leg, initial encounter: Secondary | ICD-10-CM | POA: Insufficient documentation

## 2020-12-10 DIAGNOSIS — I10 Essential (primary) hypertension: Secondary | ICD-10-CM | POA: Diagnosis not present

## 2020-12-10 DIAGNOSIS — S0993XA Unspecified injury of face, initial encounter: Secondary | ICD-10-CM | POA: Diagnosis not present

## 2020-12-10 DIAGNOSIS — I2510 Atherosclerotic heart disease of native coronary artery without angina pectoris: Secondary | ICD-10-CM | POA: Insufficient documentation

## 2020-12-10 DIAGNOSIS — Z7982 Long term (current) use of aspirin: Secondary | ICD-10-CM | POA: Insufficient documentation

## 2020-12-10 MED ORDER — FLUORESCEIN SODIUM 1 MG OP STRP
1.0000 | ORAL_STRIP | Freq: Once | OPHTHALMIC | Status: DC
  Filled 2020-12-10: qty 1

## 2020-12-10 MED ORDER — TETRACAINE HCL 0.5 % OP SOLN
2.0000 [drp] | Freq: Once | OPHTHALMIC | Status: DC
  Filled 2020-12-10: qty 4

## 2020-12-10 NOTE — ED Triage Notes (Signed)
Pt states 1 week ago she fell asleep at her kitchen island and fell over striking the right side of her head on a cabinet. Pt now c/o of blurred vision to the right eye x5 days.obvious bruising around eye sockets noted.

## 2020-12-10 NOTE — ED Provider Notes (Signed)
[facility] EMERGENCY DEPARTMENT Provider Note   CSN: [id number] Arrival date & time: 12/10/20  5400     History Chief Complaint  Patient presents with   Blurred Vision    Grace Hess is a 65 y.o. female.  She is here with a complaint of blurry vision in her right eye and right orbit pain after falling off a stool about a week ago.  Struck her right frontal temporal area of her head.  Now is noticing some bruising around both of her eyes.  Blurry vision x5 days.  Right eye.  No double vision.  No numbness or weakness.  The history is provided by the patient.  Head Injury Location:  R temporal Time since incident:  1 week Mechanism of injury: fall   Fall:    Fall occurred:  From a stool   Point of impact:  Head Pain details:    Severity:  Mild   Timing:  Constant   Progression:  Improving Chronicity:  New Relieved by:  None tried Worsened by:  Nothing Ineffective treatments:  None tried Associated symptoms: blurred vision   Associated symptoms: no double vision, no focal weakness, no loss of consciousness, no neck pain and no vomiting       Past Medical History:  Diagnosis Date   Anxiety    Arthritis    "might have a touch in my fingers" (08/02/2016)   CAD (coronary artery disease)    a. s/p DES to RCA in 07/2016 with residual mid-LAD stenosis   Cervicalgia    Chest pain, unspecified    GERD (gastroesophageal reflux disease)    Headache    "bad ones after my stroke in 2016; only have them when I get bad news now" (08/02/2016)   Heart murmur    Heavy smoker (more than 20 cigarettes per day)    Scheduled for LDCT screening 02/11/15   Hepatitis A    "related to Janine Limbo"   History of hiatal hernia    History of kidney stones    "no cysto/OR" (12/10/2014)   Hypercholesteremia    Hypertension    Obesity    Reflux esophagitis    Stroke [facility])    "they said I had a silent stroke a few months back/MRI" (12/10/2014)   TIA (transient ischemic attack) 11/28/2014    [name] 11/28/2014   Type II diabetes mellitus ([place])     Patient Active Problem List   Diagnosis Date Noted   Vitamin D deficiency 08/23/2019   Weight loss 07/17/2019   PAD (peripheral artery disease) ([place]) 07/04/2019   IBS (irritable bowel syndrome) 01/16/2019   DM type 2 causing vascular disease ([place]) 01/08/2019   Type 2 diabetes mellitus without complication ([place]) 86/76/1950   Lumbar back pain 12/21/2018   Mixed hyperlipidemia 12/21/2018   CAD S/P percutaneous coronary angioplasty 08/03/2016   Claudication ([place]) 08/02/2016   Abnormal stress test 08/02/2016   Stable angina ([place]) 08/02/2016   Heavy smoker (more than 20 cigarettes per day)    Headache 12/10/2014   Carotid artery disease ([place]) 12/01/2014   TIA (transient ischemic attack) 11/28/2014   DM type 2 (diabetes mellitus, type 2) ([place]) 11/28/2014   Tobacco use disorder 11/28/2014   HYPERCHOLESTEROLEMIA  IIA 09/13/2008   DEPRESSION 09/13/2008   Essential hypertension, benign 09/13/2008   GERD (gastroesophageal reflux disease) 09/13/2008   Chest pain with high risk for cardiac etiology 09/13/2008    Past Surgical History:  Procedure Laterality Date   ABDOMINAL AORTOGRAM N/A 08/02/2016  Procedure: Abdominal Aortogram;  Surgeon: Nelva Bush, MD;  Location: [place] CV LAB;  Service: Cardiovascular;  Laterality: N/A;   CAROTID STENT     CHOLECYSTECTOMY OPEN  1978   CORONARY ANGIOPLASTY WITH STENT PLACEMENT  08/02/2016   to RCA   CORONARY STENT INTERVENTION N/A 08/02/2016   Procedure: Coronary Stent Intervention;  Surgeon: Nelva Bush, MD;  Location: [place] [facility];  Service: Cardiovascular;  Laterality: N/A;  RCA   ENDARTERECTOMY Left 12/02/2014   Procedure: ENDARTERECTOMY CAROTID;  Surgeon: Rosetta Posner, MD;  Location: [place];  Service: Vascular;  Laterality: Left;   LEFT HEART CATH AND CORONARY ANGIOGRAPHY N/A 08/02/2016   Procedure: Left Heart Cath and Coronary Angiography;  Surgeon: Nelva Bush, MD;   Location: [place] [facility];  Service: Cardiovascular;  Laterality: N/A;   LOWER EXTREMITY ANGIOGRAM  08/02/2016   LOWER EXTREMITY ANGIOGRAPHY N/A 08/02/2016   Procedure: Lower Extremity Angiography;  Surgeon: Nelva Bush, MD;  Location: [place] [facility];  Service: Cardiovascular;  Laterality: N/A;   VAGINAL HYSTERECTOMY  1991   "partial"     OB History   No obstetric history on file.     Family History  Problem Relation Age of Onset   Congestive Heart Failure Mother    COPD Father    Cancer Brother    Cancer Brother     Social History   Tobacco Use   Smoking status: Every Day    Packs/day: 2.00    Years: 43.00    Pack years: 86.00    Types: Cigarettes    Start date: 05/17/1974   Smokeless tobacco: Never  Vaping Use   Vaping Use: Never used  Substance Use Topics   Alcohol use: No    Alcohol/week: 0.0 standard drinks   Drug use: No    Home Medications Prior to Admission medications   Medication Sig Start Date End Date Taking? Authorizing Provider  acetaminophen (TYLENOL) 325 MG tablet Take 2 tablets (650 mg total) by mouth every 4 (four) hours as needed for headache or mild pain. 08/03/16   Erlene Quan, PA-C  albuterol (VENTOLIN HFA) 108 (90 Base) MCG/ACT inhaler albuterol sulfate HFA 90 mcg/actuation aerosol inhaler  INHALE ONE TO 2 PUFFS BY INHALATION EVERY FOUR HOURS AS NEEDED    [provider]  ALPRAZolam (XANAX) 0.5 MG tablet Take 0.5 mg by mouth 2 (two) times daily as needed. 03/20/19   [provider]  aspirin EC 81 MG tablet Take 1 tablet (81 mg total) by mouth daily. Please continue Aspirin and plavix for 6 months, then plavix daily 12/03/14   Rai, Ripudeep K, MD  Blood Glucose Monitoring Suppl (ACCU-CHEK GUIDE) w/Device KIT 1 Piece by Does not apply route as directed. 08/23/19   Cassandria Anger, MD  clopidogrel (PLAVIX) 75 MG tablet Take 1 tablet (75 mg total) by mouth daily. 12/03/14   Rai, Vernelle Emerald, MD  dicyclomine (BENTYL) 10  MG capsule Take 1 capsule (10 mg total) by mouth 2 (two) times daily. 03/28/17   Rehman, Mechele Dawley, MD  escitalopram (LEXAPRO) 5 MG tablet Take by mouth. 11/20/20   [provider]  gabapentin (NEURONTIN) 300 MG capsule Take 300 mg by mouth 2 (two) times daily.     [provider]  glipiZIDE (GLIPIZIDE XL) 2.5 MG 24 hr tablet Take 1 tablet (2.5 mg total) by mouth daily with breakfast. 04/02/20   Brita Romp, NP  glucose blood (ACCU-CHEK GUIDE) test strip Use as instructed  08/23/19   Cassandria Anger, MD  LEXAPRO 10 MG tablet Take 20 mg by mouth daily.  04/18/19   [provider]  lisinopril (ZESTRIL) 20 MG tablet Take 1 tablet (20 mg total) by mouth daily. 02/28/20   Arnoldo Lenis, MD  metoprolol tartrate (LOPRESSOR) 25 MG tablet Take 0.5 tablets (12.5 mg total) by mouth 2 (two) times daily. 12/21/19   Arnoldo Lenis, MD  nitroGLYCERIN (NITROSTAT) 0.4 MG SL tablet Place 1 tablet (0.4 mg total) under the tongue every 5 (five) minutes as needed for chest pain. 10/25/19   Arnoldo Lenis, MD  nystatin cream (MYCOSTATIN) Apply 1 application topically 2 (two) times daily as needed. 07/10/16   [provider]  ondansetron (ZOFRAN) 4 MG tablet TAKE 1 TABLET(4 MG) BY MOUTH TWICE DAILY AS NEEDED FOR NAUSEA OR VOMITING 07/24/20   Rehman, Mechele Dawley, MD  oxyCODONE-acetaminophen (PERCOCET/ROXICET) 5-325 MG tablet Take 1 tablet by mouth 3 (three) times daily as needed. 08/20/19   [provider]  pantoprazole (PROTONIX) 40 MG tablet Take 1 tablet (40 mg total) by mouth daily before breakfast. 01/22/20   Rehman, Mechele Dawley, MD  rosuvastatin (CRESTOR) 20 MG tablet Take 1 tablet (20 mg total) by mouth daily. 02/28/20 05/28/20  Arnoldo Lenis, MD  Semaglutide,0.25 or 0.5MG/DOS, 2 MG/1.5ML SOPN Inject 0.5 mg into the skin once a week. 10/27/20   Brita Romp, NP  Vitamin D, Ergocalciferol, (DRISDOL) 1.25 MG (50000 UNIT) CAPS capsule TAKE 1 CAPSULE BY MOUTH EVERY  7 DAYS 12/03/20   Cassandria Anger, MD    Allergies    Codeine, Latex, and Sulfa antibiotics  Review of Systems   Review of Systems  Constitutional:  Negative for fever.  HENT:  Negative for sore throat.   Eyes:  Positive for blurred vision and visual disturbance. Negative for double vision and pain.  Respiratory:  Negative for shortness of breath.   Cardiovascular:  Negative for chest pain.  Gastrointestinal:  Negative for abdominal pain and vomiting.  Genitourinary:  Negative for dysuria.  Musculoskeletal:  Negative for neck pain.  Skin:  Negative for rash.  Neurological:  Negative for focal weakness, loss of consciousness and weakness.   Physical Exam Updated Vital Signs BP (!) 171/79 (BP Location: Right Arm)   Pulse 75   Temp 98.9 F (37.2 C) (Oral)   Resp 20   Ht _0  (1.676 m)   Wt 64.4 kg   SpO2 95%   BMI 22.92 kg/m   Physical Exam Vitals and nursing note reviewed.  Constitutional:      General: She is not in acute distress.    Appearance: Normal appearance. She is well-developed.  HENT:     Head: Normocephalic and atraumatic.  Eyes:     Extraocular Movements: Extraocular movements intact.     Conjunctiva/sclera: Conjunctivae normal.     Pupils: Pupils are equal, round, and reactive to light.     Comments: She has some minor bruising to the nasal area of both of her eye sockets.  Cardiovascular:     Rate and Rhythm: Normal rate and regular rhythm.     Heart sounds: No murmur heard. Pulmonary:     Effort: Pulmonary effort is normal. No respiratory distress.     Breath sounds: Normal breath sounds.  Abdominal:     Palpations: Abdomen is soft.     Tenderness: There is no abdominal tenderness.  Musculoskeletal:        General: Signs of injury  present. No deformity. Normal range of motion.     Cervical back: Neck supple.     Comments: She has few contusions and abrasions to her lower extremities.  Skin:    General: Skin is warm and dry.  Neurological:      General: No focal deficit present.     Mental Status: She is alert.    ED Results / Procedures / Treatments   Labs (all labs ordered are listed, but only abnormal results are displayed) Labs Reviewed - No data to display  EKG None  Radiology CT Head Wo Contrast  Result Date: 12/10/2020 CLINICAL DATA:  Fall 1 week ago hitting right side of head EXAM: CT HEAD WITHOUT CONTRAST TECHNIQUE: Contiguous axial images were obtained from the base of the skull through the vertex without intravenous contrast. COMPARISON:  MRI 12/23/2014 FINDINGS: Brain: No acute intracranial abnormality. Specifically, no hemorrhage, hydrocephalus, mass lesion, acute infarction, or significant intracranial injury. Vascular: No hyperdense vessel or unexpected calcification. Skull: No acute calvarial abnormality. Sinuses/Orbits: Visualized paranasal sinuses and mastoids clear. Orbital soft tissues unremarkable. Other: None IMPRESSION: No acute intracranial abnormality. Electronically Signed   By: Rolm Baptise M.D.   On: 12/10/2020 11:06   CT Orbits Wo Contrast  Result Date: 12/10/2020 CLINICAL DATA:  Facial trauma, hit right side of head/face. EXAM: CT ORBITS WITHOUT CONTRAST TECHNIQUE: Multidetector CT imaging of the orbits was performed using the standard protocol without intravenous contrast. Multiplanar CT image reconstructions were also generated. COMPARISON:  No pertinent prior exam. FINDINGS: Orbits: No evidence of orbital fracture. Soft tissues unremarkable. Globes are intact. Visible paranasal sinuses: Mucosal thickening in the maxillary sinuses. No air-fluid levels. Soft tissues: Negative Osseous: No facial or orbital fracture seen. Limited intracranial: See head CT report IMPRESSION: No evidence of orbital fracture. Electronically Signed   By: Rolm Baptise M.D.   On: 12/10/2020 11:07    Procedures Procedures   Medications Ordered in ED Medications  fluorescein ophthalmic strip 1 strip (has no administration  in time range)  tetracaine (PONTOCAINE) 0.5 % ophthalmic solution 2 drop (has no administration in time range)    ED Course  I have reviewed the triage vital signs and the nursing notes.  Pertinent labs & imaging results that were available during my care of the patient were reviewed by me and considered in my medical decision making (see chart for details).  Clinical Course as of 12/10/20 1740  Wed Dec 10, 2020  1109 Fluorescein with negative uptake.  Intraocular pressure is 21. [MB]    Clinical Course User Index [MB] Hayden Rasmussen, MD   MDM Rules/Calculators/A&P                          65 year old female here with blurred vision right eye after a fall striking her head about a week ago.  New bruising around her eyes for the last 5 days.  Patient's visual acuity is 20/20 here.  No evidence of corneal abrasion.  Intraocular pressures not significantly elevated.  CT imaging ordered and interpreted by me as no acute fracture no bleed.  Reviewed with patient and she is comfortable plan for outpatient follow-up with her provider.  Given ophthalmology contact information also.  Final Clinical Impression(s) / ED Diagnoses Final diagnoses:  Blurred vision, right eye  Injury of head, initial encounter    Rx / DC Orders ED Discharge Orders     None        Aletta Edouard  C, MD 12/10/20 7680

## 2020-12-10 NOTE — Discharge Instructions (Addendum)
You are seen in the emergency department for blurred vision in your right eye after head injury.  You had a CAT scan of your head and orbits that did not show any serious findings.  Other than the bruising around your eye we also did not see any significant findings on exam.  Please follow-up with ophthalmology for full eye exam.  Return to the emergency department if any worsening or concerning symptoms

## 2020-12-11 ENCOUNTER — Other Ambulatory Visit: Payer: Self-pay | Admitting: Cardiology

## 2021-01-01 DIAGNOSIS — F330 Major depressive disorder, recurrent, mild: Secondary | ICD-10-CM | POA: Diagnosis not present

## 2021-01-01 DIAGNOSIS — M545 Low back pain, unspecified: Secondary | ICD-10-CM | POA: Diagnosis not present

## 2021-01-01 DIAGNOSIS — F419 Anxiety disorder, unspecified: Secondary | ICD-10-CM | POA: Diagnosis not present

## 2021-01-01 DIAGNOSIS — M25511 Pain in right shoulder: Secondary | ICD-10-CM | POA: Diagnosis not present

## 2021-01-01 DIAGNOSIS — Z79891 Long term (current) use of opiate analgesic: Secondary | ICD-10-CM | POA: Diagnosis not present

## 2021-01-02 ENCOUNTER — Other Ambulatory Visit (INDEPENDENT_AMBULATORY_CARE_PROVIDER_SITE_OTHER): Payer: Self-pay

## 2021-01-02 MED ORDER — ONDANSETRON HCL 4 MG PO TABS: ORAL_TABLET | ORAL | 0 refills | Status: DC

## 2021-01-11 ENCOUNTER — Other Ambulatory Visit: Payer: Self-pay | Admitting: Cardiology

## 2021-01-13 ENCOUNTER — Ambulatory Visit (INDEPENDENT_AMBULATORY_CARE_PROVIDER_SITE_OTHER): Payer: HMO | Admitting: Gastroenterology

## 2021-01-13 ENCOUNTER — Other Ambulatory Visit: Payer: Self-pay

## 2021-01-13 ENCOUNTER — Encounter (INDEPENDENT_AMBULATORY_CARE_PROVIDER_SITE_OTHER): Payer: Self-pay | Admitting: Gastroenterology

## 2021-01-13 VITALS — BP 112/66 | HR 97 | Temp 98.3°F | Ht 66.0 in | Wt 146.2 lb

## 2021-01-13 DIAGNOSIS — K219 Gastro-esophageal reflux disease without esophagitis: Secondary | ICD-10-CM

## 2021-01-13 DIAGNOSIS — R634 Abnormal weight loss: Secondary | ICD-10-CM | POA: Diagnosis not present

## 2021-01-13 DIAGNOSIS — K580 Irritable bowel syndrome with diarrhea: Secondary | ICD-10-CM

## 2021-01-13 MED ORDER — PANTOPRAZOLE SODIUM 40 MG PO TBEC: 40.0000 mg | DELAYED_RELEASE_TABLET | Freq: Every day | ORAL | 3 refills | Status: DC

## 2021-01-13 NOTE — Progress Notes (Signed)
Referring Provider: Redmond School, MD Primary Care Physician:  Redmond School, MD Primary GI Physician: Rehman  Chief Complaint  Patient presents with   Follow-up    Patient here today for a one year follow up. She denies any current Gi issues.   HPI:   TENIQUA MARRON is a 65 y.o. female with past medical history of anxiety, arthritis, CAD, GERD, high cholesterol, HTN, TIA, DM type 2.  Patient presenting today for follow up of GERD and IBS-D.  GERD: protonix 43m daily, doing well on this. She denies dysphagia or odynohphagia, very rare occasion of heartburn or acid regurgitation. She has no sore throat, cough or hoarseness.  IBS-D: occasional diarrhea, she takes imodium as needed which helps. She had an episode of diarrhea this morning, probably the first episodes in about 3 weeks. Typically has 1 BM per day. Diarrhea usually related to certain things she eats, especially certain spices.   Weight loss: patient previously had some weight loss, approx 6 pounds down at last clinic visit in nov 2021. thought to be related to chronic opoid use induced anorexia, she has gained 4 pounds since September 2022. She reports that she was taken off of metformin because of her worsening kidney function, however, She states she has taken one metformin every other morning because she felt that her appetite was very increased after being off of metformin.   NSAID use: avoids these Social hx: no etoh, smokes 1-2 PPD Fam hx: son had Crohn's disease  Last Colonoscopy:2010 single diverticulum in sigmoid colon Last Endoscopy:n/a  Recommendations:  Due for repeat screening colonoscopy, would like to do it next year  Past Medical History:  Diagnosis Date   Anxiety    Arthritis    "might have a touch in my fingers" (08/02/2016)   CAD (coronary artery disease)    a. s/p DES to RCA in 07/2016 with residual mid-LAD stenosis   Cervicalgia    Chest pain, unspecified    GERD (gastroesophageal reflux  disease)    Headache    "bad ones after my stroke in 2016; only have them when I get bad news now" (08/02/2016)   Heart murmur    Heavy smoker (more than 20 cigarettes per day)    Scheduled for LDCT screening 02/11/15   Hepatitis A    "related to TJanine Limbo   History of hiatal hernia    History of kidney stones    "no cysto/OR" (12/10/2014)   Hypercholesteremia    Hypertension    Obesity    Reflux esophagitis    St[facility] ([facility]    "they said I had a silent stroke a few months back/MRI" (12/10/2014)   TIA (transient ischemic attack) 11/28/2014   /[name]9/15/2016   [place] [place]     Past Surgical History:  Procedure Laterality Date   ABDOMINAL AORTOGRAM N/A 08/02/2016   Procedure: Abdominal Aortogram;  Surgeon: ENelva Bush MD;  [facility];  Service: Cardiovascular;  Laterality: N/A;   CAROTID STENT     CHOLECYSTECTOMY OPEN  1978   CORONARY ANGIOPLASTY WITH STENT PLACEMENT  08/02/2016   to RCA   CORONARY STENT INTERVENTION N/A 08/02/2016   Procedure: Coronary Stent Intervention;  Surgeon: ENelva Bush MD;  [facility];  Service: Cardiovascular;  Laterality: N/A;  RCA   ENDARTERECTOMY Left 12/02/2014   Procedure: ENDARTERECTOMY CAROTID;  Surgeon: TRosetta Posner MD;  [place] [facility]  Service: Vascular;  Laterality: Left;   LEFT  HEART CATH AND CORONARY ANGIOGRAPHY N/A 08/02/2016   Procedure: Left Heart Cath and Coronary Angiography;  Surgeon: Nelva Bush, MD;  Location: [place] [facility];  Service: Cardiovascular;  Laterality: N/A;   LOWER EXTREMITY ANGIOGRAM  08/02/2016   LOWER EXTREMITY ANGIOGRAPHY N/A 08/02/2016   Procedure: Lower Extremity Angiography;  Surgeon: Nelva Bush, MD;  Location: [place] [facility];  Service: Cardiovascular;  Laterality: N/A;   VAGINAL HYSTERECTOMY  1991   "partial"    Current Outpatient Medications  Medication Sig Dispense Refill   acetaminophen (TYLENOL) 325 MG tablet Take 2 tablets  (650 mg total) by mouth every 4 (four) hours as needed for headache or mild pain.     albuterol (VENTOLIN HFA) 108 (90 Base) MCG/ACT inhaler albuterol sulfate HFA 90 mcg/actuation aerosol inhaler  INHALE ONE TO 2 PUFFS BY INHALATION EVERY FOUR HOURS AS NEEDED     ALPRAZolam (XANAX) 0.5 MG tablet Take 0.5 mg by mouth 2 (two) times daily as needed.     Blood Glucose Monitoring Suppl (ACCU-CHEK GUIDE) w/Device KIT 1 Piece by Does not apply route as directed. 1 kit 0   clopidogrel (PLAVIX) 75 MG tablet Take 1 tablet (75 mg total) by mouth daily. 30 tablet 3   dicyclomine (BENTYL) 10 MG capsule Take 1 capsule (10 mg total) by mouth 2 (two) times daily. 60 capsule 5   gabapentin (NEURONTIN) 300 MG capsule Take 300 mg by mouth at bedtime.     glipiZIDE (GLIPIZIDE XL) 2.5 MG 24 hr tablet Take 1 tablet (2.5 mg total) by mouth daily with breakfast. 90 tablet 3   glucose blood (ACCU-CHEK GUIDE) test strip Use as instructed 150 each 2   LEXAPRO 10 MG tablet Take 20 mg by mouth daily.      lisinopril (ZESTRIL) 20 MG tablet Take 1 tablet (20 mg total) by mouth daily. 90 tablet 3   metoprolol tartrate (LOPRESSOR) 25 MG tablet TAKE 1/2 TABLET(12.5 MG) BY MOUTH TWICE DAILY. NEED AN APPOINTMENT FOR REFILLS 15 tablet 0   nitroGLYCERIN (NITROSTAT) 0.4 MG SL tablet Place 1 tablet (0.4 mg total) under the tongue every 5 (five) minutes as needed for chest pain. 25 tablet 3   nystatin cream (MYCOSTATIN) Apply 1 application topically 2 (two) times daily as needed.  5   ondansetron (ZOFRAN) 4 MG tablet TAKE 1 TABLET(4 MG) BY MOUTH TWICE DAILY AS NEEDED FOR NAUSEA OR VOMITING 20 tablet 0   oxyCODONE-acetaminophen (PERCOCET/ROXICET) 5-325 MG tablet Take 1 tablet by mouth 3 (three) times daily as needed.     pantoprazole (PROTONIX) 40 MG tablet Take 1 tablet (40 mg total) by mouth daily before breakfast. 90 tablet 3   rosuvastatin (CRESTOR) 20 MG tablet Take 1 tablet (20 mg total) by mouth daily. 90 tablet 3    Semaglutide,0.25 or 0.5MG/DOS, 2 MG/1.5ML SOPN Inject 0.5 mg into the skin once a week. 3 mL 3   Vitamin D, Ergocalciferol, (DRISDOL) 1.25 MG (50000 UNIT) CAPS capsule TAKE 1 CAPSULE BY MOUTH EVERY 7 DAYS 12 capsule 0   No current facility-administered medications for this visit.    Allergies as of 01/13/2021 - Review Complete 01/13/2021  Allergen Reaction Noted   Codeine Shortness Of Breath 03/18/2011   Latex Itching 12/10/2014   Sulfa antibiotics Other (See Comments) 03/18/2011    Family History  Problem Relation Age of Onset   Congestive Heart Failure Mother    COPD Father    Cancer Brother    Cancer Brother  Social History   Socioeconomic History   Marital status: Married    Spouse name: Not on file   Number of children: 2   Years of education: 12   Highest education level: Not on file  Occupational History   Not on file  Tobacco Use   Smoking status: Every Day    Packs/day: 2.00    Years: 43.00    Pack years: 86.00    Types: Cigarettes    Start date: 05/17/1974   Smokeless tobacco: Never  Vaping Use   Vaping Use: Never used  Substance and Sexual Activity   Alcohol use: No    Alcohol/week: 0.0 standard drinks   Drug use: No   Sexual activity: Never  Other Topics Concern   Not on file  Social History Narrative   Patient does not drink caffeine.   Patient is right handed.    Social Determinants of Health   Financial Resource Strain: Not on file  Food Insecurity: Not on file  Transportation Needs: Not on file  Physical Activity: Not on file  Stress: Not on file  Social Connections: Not on file   Review of systems General: negative for malaise, night sweats, fever, chills, weight los Neck: Negative for lumps, goiter, pain and significant neck swelling Resp: Negative for cough, wheezing, dyspnea at rest CV: Negative for chest pain, leg swelling, palpitations, orthopnea GI: denies melena, hematochezia, nausea, vomiting, constipation, dysphagia,  odyonophagia, early satiety or unintentional weight loss. +occasional diarrhea MSK: Negative for joint pain or swelling, back pain, and muscle pain. Derm: Negative for itching or rash Psych: Denies depression, anxiety, memory loss, confusion. No homicidal or suicidal ideation.  Heme: Negative for prolonged bleeding, bruising easily, and swollen nodes. Endocrine: Negative for cold or heat intolerance, polyuria, polydipsia and goiter. Neuro: negative for tremor, gait imbalance, syncope and seizures. The remainder of the review of systems is noncontributory.  Physical Exam: BP 112/66 (BP Location: Left Arm, Patient Position: Sitting, Cuff Size: Small)   Pulse 97   Temp 98.3 F (36.8 C) (Oral)   Ht 5' 6" (1.676 m)   Wt 146 lb 3.2 oz (66.3 kg)   BMI 23.60 kg/m  General:   Alert and oriented. No distress noted. Pleasant and cooperative.  Head:  Normocephalic and atraumatic. Eyes:  Conjuctiva clear without scleral icterus. Mouth:  Oral mucosa pink and moist. Good dentition. No lesions. Heart: Normal rate and rhythm, s1 and s2 heart sounds present.  Lungs: Clear lung sounds in all lobes. Respirations equal and unlabored. Abdomen:  +BS, soft, non-tender and non-distended. No rebound or guarding. No HSM or masses noted. Derm: No palmar erythema or jaundice Msk:  Symmetrical without gross deformities. Normal posture. Extremities:  Without edema. Neurologic:  Alert and  oriented x4 Psych:  Alert and cooperative. Normal mood and affect.  Invalid input(s): 6 MONTHS   ASSESSMENT: KAHDIJAH ERRICKSON is a 65 y.o. female presenting today for follow up of GERD, IBS-D and weight loss.  Doing well on curent PPI regimen, rare occasion of breakthrough reflux. Will continue with current med and dose. Refill sent.  She has very infrequent episodes of diarrhea, typically related to something she eats. Had one episode this morning, the first in about 3 weeks. She will take imodium as needed when she has  these episodes which seems to help. She will let me know if diarrhea becomes more frequent or with associated symptoms.  Her weight is well maintained at this time, she is 146 in office today.  4 pounds up since September, 144 lbs last November. She reports that she was taken off of her metformin due to worsening renal function, however, her appetite increased so much she has been taking one metformin every other day. I encouraged her to talk with the provider that manages her diabetes about other alternatives, she is currently on glipizide.   She is due for screening colonoscopy, as last one was in 2010. She would like to wait until next year as she has a lot of doctors appts set up already for the rest of the year. She will let us know when she is ready to do this. She was encouraged to get this scheduled for appropriate continued screening.   No red flag symptoms. Patient denies melena, hematochezia, nausea, vomiting,  constipation, dysphagia, odyonophagia, early satiety or weight loss.    PLAN:  Continue protonix 62m once daily, 30-45 minutes prior to breakfast 2. Patient to let u[place]ore[place]now when she is ready to proceed with colonoscopy 3. Continue to avoid foods that seem to worsen diarrhea 4. Imodium as needed  Follow Up: 1 year  Skylin Kennerson L. CAlver Sorrow MSN, APRN, AGNP-C Adult-Gerontology Nurse Practitioner [facility]

## 2021-01-13 NOTE — Patient Instructions (Signed)
-  Continue protonix 40mg  once daily, 30-45 minutes prior to breakfast. I have sent a refill of this for you. -Please let me know if you start to have diarrhea more frequently again. -We will continue to monitor your weight but I am happy with where it is today. -please let us know when you are ready to proceed with your colonoscopy as last one was in 2010  Follow up 1 year

## 2021-01-15 ENCOUNTER — Telehealth: Payer: Self-pay | Admitting: Cardiology

## 2021-01-15 MED ORDER — METOPROLOL TARTRATE 25 MG PO TABS: ORAL_TABLET | ORAL | 0 refills | Status: DC

## 2021-01-15 NOTE — Telephone Encounter (Signed)
*  STAT* If patient is at the pharmacy, call can be transferred to refill team.   1. Which medications need to be refilled? (please list name of each medication and dose if known)  Metoprolol  2. Which pharmacy/location (including street and city if local pharmacy) is medication to be sent to? [place]  . Do they need a 30 day or 90 day supply? enough her appointment on 02-11-21

## 2021-01-15 NOTE — Telephone Encounter (Signed)
Medication refill request approved for Metoprolol Tartrate 25 mg tablets for 30 days. Pt has appt with B. Ahmed Prima, PA-C on 02/11/2021.

## 2021-01-20 DIAGNOSIS — M7631 Iliotibial band syndrome, right leg: Secondary | ICD-10-CM | POA: Diagnosis not present

## 2021-01-20 DIAGNOSIS — M25561 Pain in right knee: Secondary | ICD-10-CM | POA: Diagnosis not present

## 2021-02-11 ENCOUNTER — Encounter: Payer: Self-pay | Admitting: Student

## 2021-02-11 ENCOUNTER — Ambulatory Visit: Payer: HMO | Admitting: Student

## 2021-02-11 ENCOUNTER — Other Ambulatory Visit: Payer: Self-pay

## 2021-02-11 VITALS — BP 100/56 | HR 73 | Ht 66.0 in | Wt 147.6 lb

## 2021-02-11 DIAGNOSIS — E785 Hyperlipidemia, unspecified: Secondary | ICD-10-CM

## 2021-02-11 DIAGNOSIS — I6523 Occlusion and stenosis of bilateral carotid arteries: Secondary | ICD-10-CM | POA: Diagnosis not present

## 2021-02-11 DIAGNOSIS — I10 Essential (primary) hypertension: Secondary | ICD-10-CM

## 2021-02-11 DIAGNOSIS — I739 Peripheral vascular disease, unspecified: Secondary | ICD-10-CM

## 2021-02-11 DIAGNOSIS — I2510 Atherosclerotic heart disease of native coronary artery without angina pectoris: Secondary | ICD-10-CM | POA: Diagnosis not present

## 2021-02-11 DIAGNOSIS — F17200 Nicotine dependence, unspecified, uncomplicated: Secondary | ICD-10-CM

## 2021-02-11 NOTE — Progress Notes (Signed)
Cardiology Office Note    Date:  02/11/2021   ID:  Catherin, Doorn 02-15-1956, MRN [id number]  PCP:  Redmond School, MD  Cardiologist: Carlyle Dolly, MD    Chief Complaint  Patient presents with   Follow-up    Overdue Visit    History of Present Illness:    Grace Hess is a 65 y.o. female with past medical history of CAD (s/p DES to RCA in 07/2016 with residual mid-LAD stenosis), PAD (known CTO of left SFA), carotid artery stenosis (s/p L CEA), HTN, HLD, Type 2 DM and tobacco use who presents to the office today for overdue follow-up.   She was last examined by myself in 08/2019 and denied any recent anginal symptoms. Was still smoking 1.5 - 2.0 ppd. Long-term DAPT had previously been recommended given her CAD, PAD and carotid artery stenosis, therefore she was continued on ASA and Plavix along with Lopressor, Lisinopril and Crestor.   In talking with the patient today, she reports being under increased stress since earlier this year due to her son passing away this summer. She has been trying to be more active around the house and denies any specific exertional chest pain. She does report taking nitroglycerin approximately once every 2 to 3 months for pain that occurs at rest and her symptoms resolve with nitroglycerin x1. No recent increase in this. She has baseline dyspnea on exertion but denies any orthopnea, PND or lower extremity edema.  She does continue to smoke approximately 1.5 packs/day. Reports she did quit taking ASA and now takes Plavix alone due to frequent bruising when on both.   Past Medical History:  Diagnosis Date   Anxiety    Arthritis    "might have a touch in my fingers" (08/02/2016)   CAD (coronary artery disease)    a. 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"   History of hiatal hernia    History of kidney stones    "no cysto/OR" (12/10/2014)   Hypercholesteremia    Hypertension    Obesity    Reflux esophagitis    Stroke [facility])    "they said I had a silent stroke a few months back/MRI" (12/10/2014)   TIA (transient ischemic attack) 11/28/2014   [name] 11/28/2014   Type II diabetes mellitus ([place])     Past Surgical History:  Procedure Laterality Date   ABDOMINAL AORTOGRAM N/A 08/02/2016   Procedure: Abdominal Aortogram;  Surgeon: Nelva Bush, MD;  Location: [facility];  Service: Cardiovascular;  Laterality: N/A;   CAROTID STENT     CHOLECYSTECTOMY OPEN  1978   COLONOSCOPY  2010   single diverticulum in sigmoid colon   CORONARY ANGIOPLASTY WITH STENT PLACEMENT  08/02/2016   to RCA   CORONARY STENT INTERVENTION N/A 08/02/2016   Procedure: Coronary Stent Intervention;  Surgeon: Nelva Bush, MD;  Location: [facility];  Service: Cardiovascular;  Laterality: N/A;  RCA   ENDARTERECTOMY Left 12/02/2014   Procedure: ENDARTERECTOMY CAROTID;  Surgeon: Rosetta Posner, MD;  Location: [facility]  OR;  Service: Vascular;  Laterality: Left;   LEFT HEART CATH AND CORONARY ANGIOGRAPHY N/A 08/02/2016   Procedure: Left Heart Cath and Coronary Angiography;  Surgeon: Nelva Bush, MD;  Location: [place] [facility];  Service: Cardiovascular;  Laterality: N/A;   LOWER EXTREMITY ANGIOGRAM  08/02/2016   LOWER EXTREMITY ANGIOGRAPHY N/A 08/02/2016   Procedure: Lower Extremity Angiography;  Surgeon: Nelva Bush, MD;  Location: [facility];  Service: Cardiovascular;  Laterality: N/A;   VAGINAL HYSTERECTOMY  1991   "partial"    Current Medications: Outpatient Medications Prior to Visit  Medication Sig Dispense Refill   acetaminophen (TYLENOL) 325 MG tablet Take 2 tablets  (650 mg total) by mouth every 4 (four) hours as needed for headache or mild pain.     albuterol (VENTOLIN HFA) 108 (90 Base) MCG/ACT inhaler albuterol sulfate HFA 90 mcg/actuation aerosol inhaler  INHALE ONE TO 2 PUFFS BY INHALATION EVERY FOUR HOURS AS NEEDED     ALPRAZolam (XANAX) 0.5 MG tablet Take 0.5 mg by mouth 2 (two) times daily as needed.     Blood Glucose Monitoring Suppl (ACCU-CHEK GUIDE) w/Device KIT 1 Piece by Does not apply route as directed. 1 kit 0   clopidogrel (PLAVIX) 75 MG tablet Take 1 tablet (75 mg total) by mouth daily. 30 tablet 3   dicyclomine (BENTYL) 10 MG capsule Take 1 capsule (10 mg total) by mouth 2 (two) times daily. 60 capsule 5   gabapentin (NEURONTIN) 300 MG capsule Take 300 mg by mouth at bedtime.     glipiZIDE (GLIPIZIDE XL) 2.5 MG 24 hr tablet Take 1 tablet (2.5 mg total) by mouth daily with breakfast. 90 tablet 3   glucose blood (ACCU-CHEK GUIDE) test strip Use as instructed 150 each 2   LEXAPRO 10 MG tablet Take 20 mg by mouth daily.      lisinopril (ZESTRIL) 20 MG tablet Take 1 tablet (20 mg total) by mouth daily. 90 tablet 3   metoprolol tartrate (LOPRESSOR) 25 MG tablet TAKE 1/2 TABLET(12.5 MG) BY MOUTH TWICE DAILY. NEED AN APPOINTMENT FOR REFILLS 30 tablet 0   nitroGLYCERIN (NITROSTAT) 0.4 MG SL tablet Place 1 tablet (0.4 mg total) under the tongue every 5 (five) minutes as needed for chest pain. 25 tablet 3   nystatin cream (MYCOSTATIN) Apply 1 application topically 2 (two) times daily as needed.  5   ondansetron (ZOFRAN) 4 MG tablet TAKE 1 TABLET(4 MG) BY MOUTH TWICE DAILY AS NEEDED FOR NAUSEA OR VOMITING 20 tablet 0   oxyCODONE-acetaminophen (PERCOCET/ROXICET) 5-325 MG tablet Take 1 tablet by mouth 3 (three) times daily as needed.     pantoprazole (PROTONIX) 40 MG tablet Take 1 tablet (40 mg total) by mouth daily before breakfast. 90 tablet 3   rosuvastatin (CRESTOR) 20 MG tablet Take 1 tablet (20 mg total) by mouth daily. 90 tablet 3    Semaglutide,0.25 or 0.5MG/DOS, 2 MG/1.5ML SOPN Inject 0.5 mg into the skin once a week. 3 mL 3   Vitamin D, Ergocalciferol, (DRISDOL) 1.25 MG (50000 UNIT) CAPS capsule TAKE 1 CAPSULE BY MOUTH EVERY 7 DAYS 12 capsule 0   No facility-administered medications prior to visit.     Allergies:   Codeine, Latex, and Sulfa antibiotics   Social History   Socioeconomic History   Marital status: Married    Spouse name: Not on file   Number of children: 2   Years of education: 12   Highest education level: Not on file  Occupational History  Not on file  Tobacco Use   Smoking status: Every Day    Packs/day: 1.50    Years: 43.00    Pack years: 64.50    Types: Cigarettes    Start date: 05/17/1974   Smokeless tobacco: Never  Vaping Use   Vaping Use: Never used  Substance and Sexual Activity   Alcohol use: No    Alcohol/week: 0.0 standard drinks   Drug use: No   Sexual activity: Never  Other Topics Concern   Not on file  Social History Narrative   Patient does not drink caffeine.   Patient is right handed.    Social Determinants of Health   Financial Resource Strain: Not on file  Food Insecurity: Not on file  Transportation Needs: Not on file  Physical Activity: Not on file  Stress: Not on file  Social Connections: Not on file     Family History:  The patient's family history includes COPD in her father; Cancer in her brother and brother; Congestive Heart Failure in her mother.   Review of Systems:    Please see the history of present illness.     All other systems reviewed and are otherwise negative except as noted above.   Physical Exam:    VS:  BP (!) 100/56   Pulse 73   Ht '5\' 6"'  (1.676 m)   Wt 147 lb 9.6 oz (67 kg)   SpO2 95%   BMI 23.82 kg/m    General: Well developed, well nourished,female appearing in no acute distress. Head: Normocephalic, atraumatic. Neck: No carotid bruits. JVD not elevated.  Lungs: Respirations regular and unlabored, without wheezes or  rales.  Heart: Regular rate and rhythm. No S3 or S4.  No murmur, no rubs, or gallops appreciated. Abdomen: Appears non-distended. No obvious abdominal masses. Msk:  Strength and tone appear normal for age. No obvious joint deformities or effusions. Extremities: No clubbing or cyanosis. No pitting edema.  Distal pedal pulses are 2+ bilaterally. Neuro: Alert and oriented X 3. Moves all extremities spontaneously. No focal deficits noted. Psych:  Responds to questions appropriately with a normal affect. Skin: No rashes or lesions noted  Wt Readings from Last 3 Encounters:  02/11/21 147 lb 9.6 oz (67 kg)  01/13/21 146 lb 3.2 oz (66.3 kg)  12/10/20 142 lb (64.4 kg)     Studies/Labs Reviewed:   EKG:  EKG is ordered today. The ekg ordered today demonstrates NSR, HR 65 with no acute ST changes when compared to prior tracings.   Recent Labs: 11/25/2020: ALT 21; BUN 34; Creatinine, Ser 1.54; Potassium 4.7; Sodium 142   Lipid Panel    Component Value Date/Time   CHOL 124 03/26/2020 0927   TRIG 112 03/26/2020 0927   HDL 43 03/26/2020 0927   CHOLHDL 2.9 03/26/2020 0927   CHOLHDL 3.6 01/16/2019 0937   VLDL UNABLE TO CALCULATE IF TRIGLYCERIDE OVER 400 mg/dL 11/29/2014 0630   LDLCALC 61 03/26/2020 0927   LDLCALC 69 01/16/2019 0937    Additional studies/ records that were reviewed today include:   Cardiac Catheterization: 07/2016 Prox LAD to Mid LAD lesion, 20 %stenosed.   Conclusions: Significant 2 vessel coronary artery disease, including 60-70% mid LAD and 90% proximal RCA stenoses. Mildly elevated left ventricular filling pressure. Aortic atherosclerosis with mild to moderate bilateral common iliac artery disease. 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 Bilateral two-vessel runoff. Successful PCI to the proximal RCA with placement of a Promus Premier 2.5 x 16  mm drug-eluting stent with 0% residual stenosis and TIMI-3 flow.    Recommendations: Continue dual antiplatelet therapy with aspirin and clopidogrel for at least 6 months, ideally longer. If patient has recurrent chest pain, consider FFR guided PCI (likely requiring orbital atherectomy) to the mid LAD. Peripheral angiography reviewed with Dr. Gwenlyn Found; endovascular intervention to left SFA could be considered in the future, if claudication persists.  Echocardiogram: 12/2018 IMPRESSIONS     1. Left ventricular ejection fraction, by visual estimation, is 65 to  70%. The left ventricle has normal function. There is mildly increased  left ventricular hypertrophy.   2. Global right ventricle has normal systolic function.The right  ventricular size is normal. No increase in right ventricular wall  thickness.   3. Left atrial size was normal.   4. Right atrial size was normal.   5. Presence of pericardial fat pad.   6. The pericardial effusion is circumferential.   7. Trivial pericardial effusion is present.   8. Mild aortic valve annular calcification.   9. The mitral valve is normal in structure. No evidence of mitral valve  regurgitation. No evidence of mitral stenosis.  10. The tricuspid valve is normal in structure. Tricuspid valve  regurgitation is not demonstrated.  11. The aortic valve has an indeterminant number of cusps. Aortic valve  regurgitation is not visualized. Mild aortic valve sclerosis without  stenosis.  12. There is Mild calcification of the aortic valve.  13. There is Mild thickening of the aortic valve.  14. The pulmonic valve was not well visualized. Pulmonic valve  regurgitation is not visualized.   Assessment:    1. Coronary artery disease involving native coronary artery of native heart without angina pectoris   2. Bilateral carotid artery stenosis   3. PAD (peripheral artery disease) ([place])   4. Essential hypertension, benign   5. Hyperlipidemia LDL goal <70   6. Current smoker      Plan:   In order of problems listed  above:  1. CAD - She is s/p DES to RCA in 07/2016 with residual mid-LAD stenosis. She reports occasional chest pain at rest but symptoms resolve with SL NTGx1 and she uses this sparingly. I encouraged her to make [place] aware if symptoms progress in frequency or severity.  - Remains on Plavix alone at this time as she self-discontinued ASA due to easy bruising. Continue Lopressor 12.25m BID and Crestor 267mdaily.   2. Carotid Artery Stenosis -  She is s/p L CEA and most recent dopplers in 202202howed 03-19-40%ICA stenosis and 6070-62%ICA stenosis. Will order carotid dopplers as she has not been evaluated by Vascular in several years. Pending results, may need referral back. Continue Plavix and statin therapy.   3. PAD - She has a known CTO of left SFA but denies any recent claudication. Continue Plavix and statin therapy.   4. HTN - Her BP is soft at 100/56 today and I encouraged her to monitor her BP at home. If this remains soft, would reduce Lisinopril from 208maily to 92m37mily. Continue Lopressor at current dosing of 12.5mg 36m.   5. HLD - FLP earlier this year showed total cholesterol of 124, triglycerides 112, HDL 43 and LDL 61. Continue Crestor 20mg 73my.   6. Tobacco Use - She does continue to smoke 1.0 - 1.5 ppd. Stress continues to play a role in the amount she smokes on a daily basis.    Medication Adjustments/Labs and Tests Ordered: Current medicines are reviewed  at length with the patient today.  Concerns regarding medicines are outlined above.  Medication changes, Labs and Tests ordered today are listed in the Patient Instructions below. Patient Instructions  Medication Instructions:  Your physician recommends that you continue on your current medications as directed. Please refer to the Current Medication list given to you today.  *If you need a refill on your cardiac medications before your next appointment, please call your pharmacy*   Lab Work: NONE   If you have  labs (blood work) drawn today and your tests are completely normal, you will receive your results only by: [place] (if you have MyChart) OR A paper copy in the mail If you have any lab test that is abnormal or we need to change your treatment, we will call you to review the results.   Testing/Procedures: Your physician has requested that you have a carotid duplex. This test is an ultrasound of the carotid arteries in your neck. It looks at blood flow through these arteries that supply the brain with blood. Allow one hour for this exam. There are no restrictions or special instructions.    Follow-Up: At [facility], you and your health needs are our priority.  As part of our continuing mission to provide you with exceptional heart care, we have created designated Provider Care Teams.  These Care Teams include your primary Cardiologist (physician) and Advanced Practice Providers (APPs -  Physician Assistants and Nurse Practitioners) who all work together to provide you with the care you need, when you need it.  We recommend signing up for the patient portal called "MyChart".  Sign up information is provided on this After Visit Summary.  MyChart is used to connect with patients for Virtual Visits (Telemedicine).  Patients are able to view lab/test results, encounter notes, upcoming appointments, etc.  Non-urgent messages can be sent to your provider as well.   To learn more about what you can do with MyChart, go to [email or web address].    Your next appointment:   1 year(s)  The format for your next appointment:   In Person  Provider:   Carlyle Dolly, MD    Other Instructions Thank you for choosing [facility]!     Signed, Erma Heritage, PA-C  02/11/2021 7:26 PM    [facility] [address] [place], [place] [address] Phone: [phone] Fax: [phone]

## 2021-02-11 NOTE — Patient Instructions (Signed)
Medication Instructions:  Your physician recommends that you continue on your current medications as directed. Please refer to the Current Medication list given to you today.  *If you need a refill on your cardiac medications before your next appointment, please call your pharmacy*   Lab Work: NONE   If you have labs (blood work) drawn today and your tests are completely normal, you will receive your results only by: [place] (if you have MyChart) OR A paper copy in the mail If you have any lab test that is abnormal or we need to change your treatment, we will call you to review the results.   Testing/Procedures: Your physician has requested that you have a carotid duplex. This test is an ultrasound of the carotid arteries in your neck. It looks at blood flow through these arteries that supply the brain with blood. Allow one hour for this exam. There are no restrictions or special instructions.    Follow-Up: At [facility], you and your health needs are our priority.  As part of our continuing mission to provide you with exceptional heart care, we have created designated Provider Care Teams.  These Care Teams include your primary Cardiologist (physician) and Advanced Practice Providers (APPs -  Physician Assistants and Nurse Practitioners) who all work together to provide you with the care you need, when you need it.  We recommend signing up for the patient portal called "MyChart".  Sign up information is provided on this After Visit Summary.  MyChart is used to connect with patients for Virtual Visits (Telemedicine).  Patients are able to view lab/test results, encounter notes, upcoming appointments, etc.  Non-urgent messages can be sent to your provider as well.   To learn more about what you can do with MyChart, go to [email or web address].    Your next appointment:   1 year(s)  The format for your next appointment:   In Person  Provider:   Carlyle Dolly, MD     Other Instructions Thank you for choosing [facility]!

## 2021-02-17 ENCOUNTER — Other Ambulatory Visit: Payer: Self-pay | Admitting: "Endocrinology

## 2021-02-22 ENCOUNTER — Other Ambulatory Visit: Payer: Self-pay | Admitting: Cardiology

## 2021-02-25 ENCOUNTER — Other Ambulatory Visit: Payer: Self-pay | Admitting: Student

## 2021-02-25 DIAGNOSIS — I6523 Occlusion and stenosis of bilateral carotid arteries: Secondary | ICD-10-CM

## 2021-02-27 ENCOUNTER — Other Ambulatory Visit: Payer: Self-pay

## 2021-02-27 ENCOUNTER — Ambulatory Visit (HOSPITAL_COMMUNITY)
Admission: RE | Admit: 2021-02-27 | Discharge: 2021-02-27 | Disposition: A | Discharge status: Home / Self Care | Payer: HMO | Source: Ambulatory Visit | Attending: Internal Medicine | Admitting: Internal Medicine

## 2021-02-27 ENCOUNTER — Other Ambulatory Visit (HOSPITAL_COMMUNITY): Payer: Self-pay | Admitting: Internal Medicine

## 2021-02-27 DIAGNOSIS — I10 Essential (primary) hypertension: Secondary | ICD-10-CM | POA: Diagnosis not present

## 2021-02-27 DIAGNOSIS — R079 Chest pain, unspecified: Secondary | ICD-10-CM | POA: Diagnosis not present

## 2021-02-27 DIAGNOSIS — J329 Chronic sinusitis, unspecified: Secondary | ICD-10-CM | POA: Diagnosis not present

## 2021-02-27 DIAGNOSIS — K219 Gastro-esophageal reflux disease without esophagitis: Secondary | ICD-10-CM | POA: Diagnosis not present

## 2021-02-27 DIAGNOSIS — Z6823 Body mass index (BMI) 23.0-23.9, adult: Secondary | ICD-10-CM | POA: Diagnosis not present

## 2021-02-27 DIAGNOSIS — R0781 Pleurodynia: Secondary | ICD-10-CM | POA: Diagnosis not present

## 2021-02-27 DIAGNOSIS — M1991 Primary osteoarthritis, unspecified site: Secondary | ICD-10-CM | POA: Diagnosis not present

## 2021-02-27 DIAGNOSIS — J9811 Atelectasis: Secondary | ICD-10-CM | POA: Diagnosis not present

## 2021-02-27 DIAGNOSIS — R059 Cough, unspecified: Secondary | ICD-10-CM | POA: Diagnosis not present

## 2021-03-02 ENCOUNTER — Other Ambulatory Visit: Payer: Self-pay

## 2021-03-02 ENCOUNTER — Telehealth: Payer: Self-pay | Admitting: Nurse Practitioner

## 2021-03-02 DIAGNOSIS — E1159 Type 2 diabetes mellitus with other circulatory complications: Secondary | ICD-10-CM

## 2021-03-02 MED ORDER — SEMAGLUTIDE(0.25 OR 0.5MG/DOS) 2 MG/1.5ML ~~LOC~~ SOPN: 0.5000 mg | PEN_INJECTOR | SUBCUTANEOUS | 3 refills | Status: DC

## 2021-03-02 MED ORDER — SEMAGLUTIDE(0.25 OR 0.5MG/DOS) 2 MG/1.5ML ~~LOC~~ SOPN
0.5000 mg | PEN_INJECTOR | SUBCUTANEOUS | 2 refills | Status: DC
Start: 2021-03-02 — End: 2021-05-22

## 2021-03-02 NOTE — Telephone Encounter (Signed)
Pt is calling and is needing ozempic sent in. Informed pt if this pharmacy does not have it she will need to call around and see who has it that we can send it too as it has been a shortage.   [facility] [phone] - [place], [address] Phone:  [phone]  Fax:  [phone]

## 2021-03-02 NOTE — Telephone Encounter (Signed)
Medication has been sent to the requested pharmacy.

## 2021-03-19 ENCOUNTER — Telehealth: Payer: Self-pay | Admitting: Student

## 2021-03-19 MED ORDER — METOPROLOL TARTRATE 25 MG PO TABS: 25.0000 mg | ORAL_TABLET | Freq: Two times a day (BID) | ORAL | 3 refills | Status: DC

## 2021-03-19 NOTE — Telephone Encounter (Signed)
Patient agrees to increase lopressor to 25 mg bid and keep record of BP,HR.

## 2021-03-19 NOTE — Telephone Encounter (Signed)
I will forward to provider for review

## 2021-03-19 NOTE — Telephone Encounter (Signed)
Pt c/o BP issue: STAT if pt c/o blurred vision, one-sided weakness or slurred speech  1. What are your last 5 BP readings?  Today 160/80 Monday 157/74  2. Are you having any other symptoms (ex. Dizziness, headache, blurred vision, passed out)? A bit of dizziness, her heart beat goes up a little.   3. What is your BP issue? BP is high, she said she was told to give a call so her medication can be adjusted.

## 2021-03-19 NOTE — Telephone Encounter (Signed)
° °  Her blood pressure was actually soft at 100/56 at the time of her last office visit. If her blood pressure has been this elevated and she has been experiencing episodes of elevated heart rate, would recommend titration initially of Lopressor to 25 mg twice daily.  Continue to follow HR and BP at home. May need to adjust Lisinopril dosing going forward pending her trend.   Signed, Erma Heritage, PA-C 03/19/2021, 4:20 PM Pager: [phone]

## 2021-03-26 ENCOUNTER — Other Ambulatory Visit: Payer: Self-pay | Admitting: Nurse Practitioner

## 2021-03-28 ENCOUNTER — Other Ambulatory Visit (INDEPENDENT_AMBULATORY_CARE_PROVIDER_SITE_OTHER): Payer: Self-pay | Admitting: Internal Medicine

## 2021-03-30 ENCOUNTER — Encounter (INDEPENDENT_AMBULATORY_CARE_PROVIDER_SITE_OTHER): Payer: Self-pay | Admitting: Gastroenterology

## 2021-03-30 ENCOUNTER — Other Ambulatory Visit: Payer: Self-pay

## 2021-03-30 ENCOUNTER — Ambulatory Visit (INDEPENDENT_AMBULATORY_CARE_PROVIDER_SITE_OTHER): Payer: HMO | Admitting: Gastroenterology

## 2021-03-30 DIAGNOSIS — E559 Vitamin D deficiency, unspecified: Secondary | ICD-10-CM | POA: Diagnosis not present

## 2021-03-30 DIAGNOSIS — K610 Anal abscess: Secondary | ICD-10-CM | POA: Diagnosis not present

## 2021-03-30 DIAGNOSIS — E1159 Type 2 diabetes mellitus with other circulatory complications: Secondary | ICD-10-CM | POA: Diagnosis not present

## 2021-03-30 NOTE — Telephone Encounter (Signed)
Has appt today

## 2021-03-30 NOTE — Patient Instructions (Signed)
Please go to the ER for evaluation for possible drainage of perianal abscess Please call to the office in 1 month to schedule screening colonoscopy

## 2021-03-30 NOTE — Progress Notes (Signed)
Maylon Peppers, M.D. Gastroenterology & Hepatology [facility] [address] [place], [place] [address]  Primary Care Physician: Redmond School, MD [address] [place] [address]  I will communicate my assessment and recommendations to the referring MD via EMR.  Problems: Perianal abscess GERD IBS  History of Present Illness: Grace Hess is a 66 y.o. female with past medical history of anxiety, coronary artery disease status post stent placement, GERD, hypertension, hyperlipidemia, obesity, stroke, diabetes, who presents for evaluation of perianal pain.  The patient was last seen on 01/13/2021. At that time, the patient was advised to continue Protonix 40 mg every day and to take Imodium as needed.  Patient reports for the last 2 weeks she has noticed a mass-like abnormality in the perianal area which has been painful. She has noticed the mass been growing in size. She has noticed some fresh blood in her underwear (she uses diapers as she has history of incontinence.). She has felt some constipation in the past as she describes straining to move her bowels (has a BM at least once a day). Has had occasional nausea infrequently. The patient denies having any vomiting, fever, chills, melena, hematemesis, abdominal distention, abdominal pain, diarrhea, jaundice, pruritus or weight loss.  Patient is currently on Plavix due to coronary stent placement.  Last Colonoscopy: 2010 - single diverticulum in sigmoid colon Last Endoscopy:n/a  Past Medical History: Past Medical History:  Diagnosis Date   Anxiety    Arthritis    "might have a touch in my fingers" (08/02/2016)   CAD (coronary artery disease)    a. 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"   History of hiatal hernia    History of kidney stones    "no cysto/OR" (12/10/2014)   Hypercholesteremia    Hypertension    Obesity    Reflux esophagitis    Stroke [facility])    "they said I had a silent stroke a few months back/MRI" (12/10/2014)   TIA (transient ischemic attack) 11/28/2014   [name] 11/28/2014   Type II diabetes mellitus ([place])     Past Surgical History: Past Surgical History:  Procedure Laterality Date   ABDOMINAL AORTOGRAM N/A 08/02/2016   Procedure: Abdominal Aortogram;  Surgeon: Nelva Bush, MD;  Location: [place] [facility];  Service: Cardiovascular;  Laterality: N/A;   CAROTID STENT     CHOLECYSTECTOMY OPEN  1978   COLONOSCOPY  2010   single diverticulum in sigmoid colon   CORONARY ANGIOPLASTY WITH STENT PLACEMENT  08/02/2016   to RCA   CORONARY STENT INTERVENTION N/A 08/02/2016   Procedure: Coronary Stent Intervention;  Surgeon: Nelva Bush, MD;  Location: [place] [facility];  Service: Cardiovascular;  Laterality: N/A;  RCA   ENDARTERECTOMY Left 12/02/2014   Procedure: ENDARTERECTOMY CAROTID;  Surgeon: Rosetta Posner, MD;  Location: [place];  Service: Vascular;  Laterality: Left;   LEFT HEART CATH AND CORONARY ANGIOGRAPHY N/A 08/02/2016   Procedure: Left Heart Cath and Coronary Angiography;  Surgeon: Nelva Bush, MD;  Location:  [place] [facility];  Service: Cardiovascular;  Laterality: N/A;   LOWER EXTREMITY ANGIOGRAM  08/02/2016   LOWER EXTREMITY ANGIOGRAPHY N/A 08/02/2016   Procedure: Lower Extremity Angiography;  Surgeon: Nelva Bush, MD;  Location: [place] [facility];  Service: Cardiovascular;  Laterality: N/A;   VAGINAL HYSTERECTOMY  1991   "partial"    Family History: Family History  Problem Relation Age of Onset   Congestive Heart Failure Mother    COPD Father     Cancer Brother    Cancer Brother     Social History: Social History   Tobacco Use  Smoking Status Every Day   Packs/day: 1.50   Years: 43.00   Pack years: 64.50   Types: Cigarettes   Start date: 05/17/1974  Smokeless Tobacco Never   Social History   Substance and Sexual Activity  Alcohol Use No   Alcohol/week: 0.0 standard drinks   Social History   Substance and Sexual Activity  Drug Use No    Allergies: Allergies  Allergen Reactions   Codeine Shortness Of Breath   Latex Itching   Sulfa Antibiotics Other (See Comments)    Reaction:  Unknown     Medications: Current Outpatient Medications  Medication Sig Dispense Refill   acetaminophen (TYLENOL) 325 MG tablet Take 2 tablets (650 mg total) by mouth every 4 (four) hours as needed for headache or mild pain.     albuterol (VENTOLIN HFA) 108 (90 Base) MCG/ACT inhaler albuterol sulfate HFA 90 mcg/actuation aerosol inhaler  INHALE ONE TO 2 PUFFS BY INHALATION EVERY FOUR HOURS AS NEEDED     ALPRAZolam (XANAX) 0.5 MG tablet Take 0.5 mg by mouth 2 (two) times daily as needed.     Blood Glucose Monitoring Suppl (ACCU-CHEK GUIDE) w/Device KIT 1 Piece by Does not apply route as directed. 1 kit 0   clopidogrel (PLAVIX) 75 MG tablet Take 1 tablet (75 mg total) by mouth daily. 30 tablet 3   dicyclomine (BENTYL) 10 MG capsule Take 1 capsule (10 mg total) by mouth 2 (two) times daily. 60 capsule 5   gabapentin (NEURONTIN) 300 MG capsule Take 300 mg by mouth at bedtime.     glipiZIDE (GLUCOTROL XL) 2.5 MG 24 hr tablet TAKE 1 TABLET(2.5 MG) BY MOUTH DAILY WITH BREAKFAST 30 tablet 0   glucose blood (ACCU-CHEK GUIDE) test strip Use as instructed 150 each 2   LEXAPRO 10 MG tablet Take 20 mg by mouth daily.      lisinopril (ZESTRIL) 20 MG tablet TAKE 1 TABLET(20 MG) BY MOUTH DAILY 90 tablet 3   metoprolol tartrate (LOPRESSOR) 25 MG tablet Take 1 tablet (25 mg total) by mouth 2 (two) times daily. 180 tablet 3   nitroGLYCERIN (NITROSTAT)  0.4 MG SL tablet Place 1 tablet (0.4 mg total) under the tongue every 5 (five) minutes as needed for chest pain. 25 tablet 3   nystatin cream (MYCOSTATIN) Apply 1 application topically 2 (two) times daily as needed.  5   ondansetron (ZOFRAN) 4 MG tablet TAKE 1 TABLET(4 MG) BY MOUTH TWICE DAILY AS NEEDED FOR NAUSEA OR VOMITING 20 tablet 0   oxyCODONE-acetaminophen (PERCOCET/ROXICET) 5-325 MG tablet Take 1 tablet by mouth 3 (three) times daily as needed.     pantoprazole (PROTONIX) 40 MG tablet Take 1 tablet (40 mg total) by mouth daily before breakfast. 90 tablet 3   rosuvastatin (CRESTOR) 20 MG tablet TAKE 1 TABLET(20 MG) BY MOUTH DAILY 90 tablet 3   Semaglutide,0.25 or 0.5MG/DOS, 2 MG/1.5ML SOPN Inject  0.5 mg into the skin once a week. 3 mL 2   Vitamin D, Ergocalciferol, (DRISDOL) 1.25 MG (50000 UNIT) CAPS capsule TAKE 1 CAPSULE BY MOUTH EVERY 7 DAYS 12 capsule 0   No current facility-administered medications for this visit.    Review of Systems: GENERAL: negative for malaise, night sweats HEENT: No changes in hearing or vision, no nose bleeds or other nasal problems. NECK: Negative for lumps, goiter, pain and significant neck swelling RESPIRATORY: Negative for cough, wheezing CARDIOVASCULAR: Negative for chest pain, leg swelling, palpitations, orthopnea GI: SEE HPI MUSCULOSKELETAL: Negative for joint pain or swelling, back pain, and muscle pain. SKIN: Negative for lesions, rash PSYCH: Negative for sleep disturbance, mood disorder and recent psychosocial stressors. HEMATOLOGY Negative for prolonged bleeding, bruising easily, and swollen nodes. ENDOCRINE: Negative for cold or heat intolerance, polyuria, polydipsia and goiter. NEURO: negative for tremor, gait imbalance, syncope and seizures. The remainder of the review of systems is noncontributory.   Physical Exam: BP (!) 88/54 (BP Location: Left Arm, Patient Position: Sitting, Cuff Size: Large)    Pulse 74    Temp 97.7 F (36.5 C)  (Oral)    Ht _0  (1.676 m)    Wt 147 lb 12.8 oz (67 kg)    BMI 23.86 kg/m  GENERAL: The patient is AO x3, in no acute distress. HEENT: Head is normocephalic and atraumatic. EOMI are intact. Mouth is well hydrated and without lesions. NECK: Supple. No masses LUNGS: Clear to auscultation. No presence of rhonchi/wheezing/rales. Adequate chest expansion HEART: RRR, normal s1 and s2. ABDOMEN: Soft, nontender, no guarding, no peritoneal signs, and nondistended. BS +. No masses. RECTAL EXAM: presence of an indurated and fluctuant lesion at 5 which is very tender consistent with a perianal abscess - does not have active drainage at the moment, has a small external hemorrhoids which are nontender, normal tone, no masses, brown stool without blood. EXTREMITIES: Without any cyanosis, clubbing, rash, lesions or edema. NEUROLOGIC: AOx3, no focal motor deficit. SKIN: no jaundice, no rashes   Imaging/Labs: as above  I personally reviewed and interpreted the available labs, imaging and endoscopic files.  Impression and Plan: Grace Hess is a 66 y.o. female with past medical history of anxiety, coronary artery disease status post stent placement, GERD, hypertension, hyperlipidemia, obesity, stroke, diabetes, who presents for evaluation of perianal pain.  She is presenting with findings consistent with a perianal abscess that is indurated and very tender to palpation.  This is the first episode she has presented.  I do consider this needs to be evaluated ASAP for possible drainage as she has significant tenderness and the lesion is very fluctuant.  She was directed to go to the ER to have her abscess drained.  She will require to be discharged from the ER on antibiotics for at least 7 days (ciprofloxacin on metronidazole will be an adequate option).  Patient understood and agreed.  I discussed with her that she is overdue for colorectal cancer screening, colonoscopy will be scheduled in a month after she  recovers from her abscess.  We will keep in mind that she has a family history of Crohn's disease given her new onset of perianal disease.  -Patient was advised to go to the ER for evaluation for possible drainage of perianal abscess -Patient will need to call to the office in 1 month to schedule screening colonoscopy  All questions were answered.      Harvel Quale, MD Gastroenterology and Hepatology [facility]

## 2021-03-31 LAB — COMPREHENSIVE METABOLIC PANEL
ALT: 8 IU/L (ref 0–32)
AST: 11 IU/L (ref 0–40)
Albumin/Globulin Ratio: 1.6 (ref 1.2–2.2)
Albumin: 4.1 g/dL (ref 3.8–4.8)
Alkaline Phosphatase: 101 IU/L (ref 44–121)
BUN/Creatinine Ratio: 19 (ref 12–28)
BUN: 33 mg/dL — ABNORMAL HIGH (ref 8–27)
Bilirubin Total: 0.5 mg/dL (ref 0.0–1.2)
CO2: 18 mmol/L — ABNORMAL LOW (ref 20–29)
Calcium: 9.7 mg/dL (ref 8.7–10.3)
Chloride: 102 mmol/L (ref 96–106)
Creatinine, Ser: 1.7 mg/dL — ABNORMAL HIGH (ref 0.57–1.00)
Globulin, Total: 2.6 g/dL (ref 1.5–4.5)
Glucose: 199 mg/dL — ABNORMAL HIGH (ref 70–99)
Potassium: 4.7 mmol/L (ref 3.5–5.2)
Sodium: 137 mmol/L (ref 134–144)
Total Protein: 6.7 g/dL (ref 6.0–8.5)
eGFR: 33 mL/min/{1.73_m2} — ABNORMAL LOW (ref >59)

## 2021-03-31 LAB — LIPID PANEL
Chol/HDL Ratio: 4.6 ratio — ABNORMAL HIGH (ref 0.0–4.4)
Cholesterol, Total: 161 mg/dL (ref 100–199)
HDL: 35 mg/dL — ABNORMAL LOW (ref >39)
LDL Chol Calc (NIH): 90 mg/dL (ref 0–99)
Triglycerides: 212 mg/dL — ABNORMAL HIGH (ref 0–149)
VLDL Cholesterol Cal: 36 mg/dL (ref 5–40)

## 2021-03-31 LAB — VITAMIN D 25 HYDROXY (VIT D DEFICIENCY, FRACTURES): Vit D, 25-Hydroxy: 56.9 ng/mL (ref 30.0–100.0)

## 2021-03-31 NOTE — Telephone Encounter (Signed)
Seen yesterday by Dr. Jenetta Downer

## 2021-03-31 NOTE — Telephone Encounter (Signed)
Called patient. She states she did not go to ER yesterday but she plans on going tomorrow morning. She saw endocrinologist today. She states she would rather go to ER tomorrow because she has a trip planned to go to [place] next week. But wanted to ask if she should start on antibiotic today then you could go ahead and send in.

## 2021-03-31 NOTE — Telephone Encounter (Signed)
Can you please ask her if she went to the ER yesterday? If not I can send the antibiotic prescription and referral for evaluation by general surgery

## 2021-03-31 NOTE — Telephone Encounter (Signed)
Called and discussed with pt. Pt verbalized understanding.  

## 2021-03-31 NOTE — Telephone Encounter (Signed)
Please let her know it is better if she gets the antibiotic after she has had drainage of the abscess. This should be prescribed in the ER as well.

## 2021-04-01 ENCOUNTER — Emergency Department (HOSPITAL_COMMUNITY): Payer: HMO

## 2021-04-01 ENCOUNTER — Encounter (HOSPITAL_COMMUNITY): Payer: Self-pay

## 2021-04-01 ENCOUNTER — Telehealth (INDEPENDENT_AMBULATORY_CARE_PROVIDER_SITE_OTHER): Payer: Self-pay | Admitting: *Deleted

## 2021-04-01 ENCOUNTER — Emergency Department (HOSPITAL_COMMUNITY)
Admission: EM | Admit: 2021-04-01 | Discharge: 2021-04-01 | Disposition: A | Discharge status: Home / Self Care | Payer: HMO | Attending: Emergency Medicine | Admitting: Emergency Medicine

## 2021-04-01 ENCOUNTER — Other Ambulatory Visit (HOSPITAL_COMMUNITY): Payer: HMO

## 2021-04-01 ENCOUNTER — Other Ambulatory Visit: Payer: Self-pay

## 2021-04-01 DIAGNOSIS — L0231 Cutaneous abscess of buttock: Secondary | ICD-10-CM | POA: Diagnosis not present

## 2021-04-01 DIAGNOSIS — L0291 Cutaneous abscess, unspecified: Secondary | ICD-10-CM

## 2021-04-01 DIAGNOSIS — Z7984 Long term (current) use of oral hypoglycemic drugs: Secondary | ICD-10-CM | POA: Diagnosis not present

## 2021-04-01 DIAGNOSIS — Z91040 Latex allergy status: Secondary | ICD-10-CM | POA: Insufficient documentation

## 2021-04-01 DIAGNOSIS — E119 Type 2 diabetes mellitus without complications: Secondary | ICD-10-CM | POA: Insufficient documentation

## 2021-04-01 DIAGNOSIS — Z794 Long term (current) use of insulin: Secondary | ICD-10-CM | POA: Insufficient documentation

## 2021-04-01 DIAGNOSIS — K610 Anal abscess: Secondary | ICD-10-CM | POA: Diagnosis not present

## 2021-04-01 DIAGNOSIS — Z7902 Long term (current) use of antithrombotics/antiplatelets: Secondary | ICD-10-CM | POA: Diagnosis not present

## 2021-04-01 DIAGNOSIS — I700 Atherosclerosis of aorta: Secondary | ICD-10-CM | POA: Diagnosis not present

## 2021-04-01 MED ORDER — METRONIDAZOLE 500 MG PO TABS: 500.0000 mg | ORAL_TABLET | Freq: Two times a day (BID) | ORAL | 0 refills | Status: DC

## 2021-04-01 MED ORDER — CIPROFLOXACIN HCL 500 MG PO TABS: 500.0000 mg | ORAL_TABLET | Freq: Two times a day (BID) | ORAL | 0 refills | Status: DC

## 2021-04-01 MED ORDER — LIDOCAINE HCL (PF) 1 % IJ SOLN: 10.0000 mL | Freq: Once | INTRAMUSCULAR | Status: DC

## 2021-04-01 MED ORDER — LIDOCAINE HCL (PF) 1 % IJ SOLN
INTRAMUSCULAR | Status: AC
  Filled 2021-04-01: qty 10

## 2021-04-01 NOTE — Discharge Instructions (Addendum)
Take antibiotics as directed. Follow-up with the surgical clinic as discussed.

## 2021-04-01 NOTE — ED Provider Notes (Signed)
[name] EMERGENCY DEPARTMENT Provider Note   CSN: [id number] Arrival date & time: 04/01/21  1122     History  Chief Complaint  Patient presents with   Abscess    Grace Hess is a 66 y.o. female.  Patient reports development of abscess on the left buttock adjacent to the gluteal cleft, starting about 3 weeks ago. It has gradually increased in size, and is now draining clear fluid. No prior abscess history. Denies fever/chills. Patient was seen by GI on 03/30/21 with recommendation to report to the ED for I&D.  The history is provided by the patient. No language interpreter was used.  Abscess Location:  Pelvis Pelvic abscess location:  L buttock and gluteal cleft Abscess quality: draining, induration and redness   Red streaking: no   Duration:  3 weeks Progression:  Worsening Chronicity:  New Context: diabetes   Context: not immunosuppression   Ineffective treatments:  Warm compresses Associated symptoms: no fever   Risk factors: no hx of MRSA and no prior abscess       Home Medications Prior to Admission medications   Medication Sig Start Date End Date Taking? Authorizing Provider  acetaminophen (TYLENOL) 325 MG tablet Take 2 tablets (650 mg total) by mouth every 4 (four) hours as needed for headache or mild pain. 08/03/16   Erlene Quan, PA-C  albuterol (VENTOLIN HFA) 108 (90 Base) MCG/ACT inhaler albuterol sulfate HFA 90 mcg/actuation aerosol inhaler  INHALE ONE TO 2 PUFFS BY INHALATION EVERY FOUR HOURS AS NEEDED    [provider]  ALPRAZolam (XANAX) 0.5 MG tablet Take 0.5 mg by mouth 2 (two) times daily as needed. 03/20/19   [provider]  Blood Glucose Monitoring Suppl (ACCU-CHEK GUIDE) w/Device KIT 1 Piece by Does not apply route as directed. 08/23/19   Cassandria Anger, MD  clopidogrel (PLAVIX) 75 MG tablet Take 1 tablet (75 mg total) by mouth daily. 12/03/14   Rai, Vernelle Emerald, MD  dicyclomine (BENTYL) 10 MG capsule Take 1 capsule (10  mg total) by mouth 2 (two) times daily. 03/28/17   Rehman, Mechele Dawley, MD  gabapentin (NEURONTIN) 300 MG capsule Take 300 mg by mouth at bedtime.    [provider]  glipiZIDE (GLUCOTROL XL) 2.5 MG 24 hr tablet TAKE 1 TABLET(2.5 MG) BY MOUTH DAILY WITH BREAKFAST 03/26/21   Brita Romp, NP  glucose blood (ACCU-CHEK GUIDE) test strip Use as instructed 08/23/19   Cassandria Anger, MD  LEXAPRO 10 MG tablet Take 20 mg by mouth daily.  04/18/19   [provider]  lisinopril (ZESTRIL) 20 MG tablet TAKE 1 TABLET(20 MG) BY MOUTH DAILY 02/23/21   Arnoldo Lenis, MD  metoprolol tartrate (LOPRESSOR) 25 MG tablet Take 1 tablet (25 mg total) by mouth 2 (two) times daily. 03/19/21   Strader, Fransisco Hertz, PA-C  nitroGLYCERIN (NITROSTAT) 0.4 MG SL tablet Place 1 tablet (0.4 mg total) under the tongue every 5 (five) minutes as needed for chest pain. 10/25/19   Arnoldo Lenis, MD  nystatin cream (MYCOSTATIN) Apply 1 application topically 2 (two) times daily as needed. 07/10/16   [provider]  ondansetron (ZOFRAN) 4 MG tablet TAKE 1 TABLET(4 MG) BY MOUTH TWICE DAILY AS NEEDED FOR NAUSEA OR VOMITING 03/31/21   Montez Morita, Quillian Quince, MD  oxyCODONE-acetaminophen (PERCOCET/ROXICET) 5-325 MG tablet Take 1 tablet by mouth 3 (three) times daily as needed. 08/20/19   [provider]  pantoprazole (PROTONIX) 40 MG tablet Take 1 tablet (40  mg total) by mouth daily before breakfast. 01/13/21   Carlan, Chelsea L, NP  rosuvastatin (CRESTOR) 20 MG tablet TAKE 1 TABLET(20 MG) BY MOUTH DAILY 02/23/21   Arnoldo Lenis, MD  Semaglutide,0.25 or 0.5MG/DOS, 2 MG/1.5ML SOPN Inject 0.5 mg into the skin once a week. 03/02/21   Brita Romp, NP  Vitamin D, Ergocalciferol, (DRISDOL) 1.25 MG (50000 UNIT) CAPS capsule TAKE 1 CAPSULE BY MOUTH EVERY 7 DAYS 02/17/21   Cassandria Anger, MD      Allergies    Codeine, Latex, and Sulfa antibiotics    Review of Systems   Review of Systems   Constitutional:  Negative for fever.  All other systems reviewed and are negative.  Physical Exam Updated Vital Signs BP (!) 138/55 (BP Location: Right Arm)    Pulse 67    Temp 98.3 F (36.8 C) (Oral)    Resp 16    Ht '5\' 6"'  (1.676 m)    Wt 67 kg    SpO2 95%    BMI 23.86 kg/m  Physical Exam Constitutional:      Appearance: Normal appearance.  HENT:     Head: Normocephalic.     Nose: Nose normal.     Mouth/Throat:     Mouth: Mucous membranes are moist.  Eyes:     Conjunctiva/sclera: Conjunctivae normal.  Cardiovascular:     Rate and Rhythm: Normal rate.     Pulses: Normal pulses.  Pulmonary:     Effort: Pulmonary effort is normal.  Abdominal:     Palpations: Abdomen is soft.  Genitourinary:    Comments: Abscess to the left buttock adjacent to gluteal cleft. Skin:    General: Skin is warm and dry.  Neurological:     General: No focal deficit present.     Mental Status: She is alert and oriented to person, place, and time.  Psychiatric:        Mood and Affect: Mood normal.        Behavior: Behavior normal.    ED Results / Procedures / Treatments   Labs (all labs ordered are listed, but only abnormal results are displayed) Labs Reviewed - No data to display  EKG None  Radiology CT PELVIS WO CONTRAST  Result Date: 04/01/2021 CLINICAL DATA:  Anal/rectal abscess EXAM: CT PELVIS WITHOUT CONTRAST TECHNIQUE: Multidetector CT imaging of the pelvis was performed following the standard protocol without intravenous contrast. RADIATION DOSE REDUCTION: This exam was performed according to the departmental dose-optimization program which includes automated exposure control, adjustment of the mA and/or kV according to patient size and/or use of iterative reconstruction technique. COMPARISON:  None. FINDINGS: Urinary Tract:  No abnormality visualized. Bowel:  Unremarkable visualized pelvic bowel loops. Vascular/Lymphatic: No pathologically enlarged lymph nodes. Aortic atherosclerosis.  Reproductive:  Status post hysterectomy. Other: Extensive soft tissue thickening about the anus (series 5, image 108). Subcutaneous fat stranding about the left aspect of the gluteal cleft (series 5, image 103). Musculoskeletal: No suspicious bone lesions identified. IMPRESSION: Extensive soft tissue thickening about the anus and subcutaneous fat stranding about the left aspect of the gluteal cleft. Findings are generally in keeping with reported clinical suspicion of anal or rectal abscess and possible anal fistula, however assessment is very limited by noncontrast CT and a fistula tract or abscess cavity is not directly visualized on this examination. Contrast enhanced MRI is the test of choice for the evaluation and anatomical delineation of anal fistula and abscess. Aortic Atherosclerosis (ICD10-I70.0). Electronically Signed   By: Cristie Hem  Cherly Beach M.D.   On: 04/01/2021 14:35    Procedures .Marland KitchenIncision and Drainage  Date/Time: 04/01/2021 1:47 PM Performed by: Etta Quill, NP Authorized by: Etta Quill, NP   Consent:    Consent obtained:  Verbal   Consent given by:  Patient   Risks, benefits, and alternatives were discussed: yes     Risks discussed:  Incomplete drainage and pain Universal protocol:    Patient identity confirmed:  Verbally with patient Location:    Type:  Abscess   Location:  Anogenital   Anogenital location:  Gluteal cleft Anesthesia:    Anesthesia method:  Local infiltration Procedure details:    Incision types:  Single straight   Incision depth:  Subcutaneous   Drainage:  Bloody   Drainage amount:  Scant   Wound treatment:  Wound left open Comments:     No purulent drainage obtained. Imaging obtained after procedure--no discrete abscess noted.    Medications Ordered in ED Medications  lidocaine (PF) (XYLOCAINE) 1 % injection 10 mL (has no administration in time range)    ED Course/ Medical Decision Making/ A&P    Patient discussed with and seen by Dr.  Roderic Palau.  Spoke with Dr. Constance Haw (surgery), discussed CT findings. No MRI needed for now. Start on antibiotic and follow-up in clinic.                         Medical Decision Making Amount and/or Complexity of Data Reviewed Radiology: ordered.  Risk Prescription drug management.  Radiology results reviewed.   Patient with skin induration and redness suspicious for abscess. Incision and drainage performed in the ED today without purulent drainage. Non contrast CT obtained (due to mild elevation in creatinine and BUN) after I&D does not reveal obvious abscess. Case discussed with general surgery, who will follow-up in clinic. Supportive care and return precautions discussed.  Pt sent home with cipro and flagyl. The patient appears reasonably screened and/or stabilized for discharge and I doubt any other emergent medical condition requiring further screening, evaluation, or treatment in the ED prior to discharge.         Final Clinical Impression(s) / ED Diagnoses Final diagnoses:  Abscess    Rx / DC Orders ED Discharge Orders          Ordered    ciprofloxacin (CIPRO) 500 MG tablet  2 times daily        04/01/21 1532    metroNIDAZOLE (FLAGYL) 500 MG tablet  2 times daily        04/01/21 1532              Etta Quill, NP 04/01/21 1754    Milton Ferguson, MD 04/02/21 1053

## 2021-04-01 NOTE — Telephone Encounter (Signed)
Spoke to patient, advise she still needs to go to ER, she stated she is on her way.Marland Kitchen

## 2021-04-01 NOTE — Telephone Encounter (Signed)
Patient L/M that ofc called yesterday to see if she went to ER, she was planning on going today but abscess is slowly draining, should she still go - if not does she need antibiotic   please call [phone]

## 2021-04-01 NOTE — Patient Instructions (Signed)

## 2021-04-01 NOTE — ED Triage Notes (Signed)
Pt presents to ED with abscess on bottom started 3 weeks ago. Pt denies fever.

## 2021-04-01 NOTE — Telephone Encounter (Signed)
Yes, she needs to go

## 2021-04-02 ENCOUNTER — Ambulatory Visit: Payer: HMO | Admitting: Nurse Practitioner

## 2021-04-02 ENCOUNTER — Encounter: Payer: Self-pay | Admitting: Nurse Practitioner

## 2021-04-02 VITALS — BP 170/66 | HR 67 | Ht 66.0 in | Wt 148.0 lb

## 2021-04-02 DIAGNOSIS — I10 Essential (primary) hypertension: Secondary | ICD-10-CM | POA: Diagnosis not present

## 2021-04-02 DIAGNOSIS — E1159 Type 2 diabetes mellitus with other circulatory complications: Secondary | ICD-10-CM | POA: Diagnosis not present

## 2021-04-02 DIAGNOSIS — E782 Mixed hyperlipidemia: Secondary | ICD-10-CM

## 2021-04-02 DIAGNOSIS — E1122 Type 2 diabetes mellitus with diabetic chronic kidney disease: Secondary | ICD-10-CM

## 2021-04-02 DIAGNOSIS — E559 Vitamin D deficiency, unspecified: Secondary | ICD-10-CM | POA: Diagnosis not present

## 2021-04-02 DIAGNOSIS — N1832 Chronic kidney disease, stage 3b: Secondary | ICD-10-CM | POA: Diagnosis not present

## 2021-04-02 LAB — POCT GLYCOSYLATED HEMOGLOBIN (HGB A1C): Hemoglobin A1C: 7.9 % — AB (ref 4.0–5.6)

## 2021-04-02 NOTE — Progress Notes (Signed)
04/02/2021, 11:15 AM                                Endocrinology follow-up note   Subjective:    Patient ID: Grace Hess, female    DOB: 03/26/1955.  JEZELLE GULLICK is being seen in follow-up for management of currently uncontrolled symptomatic diabetes requested by  Redmond School, MD.   Past Medical History:  Diagnosis Date   Anxiety    Arthritis    "might have a touch in my fingers" (08/02/2016)   CAD (coronary artery disease)    a. s/p DES to RCA in 07/2016 with residual mid-LAD stenosis   Cervicalgia    Chest pain, unspecified    GERD (gastroesophageal reflux disease)    Headache    "bad ones after my stroke in 2016; only have them when I get bad news now" (08/02/2016)   Heart murmur    Heavy smoker (more than 20 cigarettes per day)    Scheduled for LDCT screening 02/11/15   Hepatitis A    "related to Janine Limbo"   History of hiatal hernia    History of kidney stones    "no cysto/OR" (12/10/2014)   Hypercholesteremia    Hypertension    Obesity    Reflux esophagitis    Stroke [facility])    "they said I had a silent stroke a few months back/MRI" (12/10/2014)   TIA (transient ischemic attack) 11/28/2014   [name] 11/28/2014   Type II diabetes mellitus ([place])     Past Surgical History:  Procedure Laterality Date   ABDOMINAL AORTOGRAM N/A 08/02/2016   Procedure: Abdominal Aortogram;  Surgeon: Nelva Bush, MD;  Location: [place] [facility];  Service: Cardiovascular;  Laterality: N/A;   CAROTID STENT     CHOLECYSTECTOMY OPEN  1978   COLONOSCOPY  2010   single diverticulum in sigmoid colon   CORONARY ANGIOPLASTY WITH STENT PLACEMENT  08/02/2016   to RCA   CORONARY STENT INTERVENTION N/A 08/02/2016   Procedure: Coronary Stent Intervention;  Surgeon: Nelva Bush, MD;  Location: [place] [facility];  Service: Cardiovascular;  Laterality: N/A;  RCA   ENDARTERECTOMY Left 12/02/2014    Procedure: ENDARTERECTOMY CAROTID;  Surgeon: Rosetta Posner, MD;  Location: [place];  Service: Vascular;  Laterality: Left;   LEFT HEART CATH AND CORONARY ANGIOGRAPHY N/A 08/02/2016   Procedure: Left Heart Cath and Coronary Angiography;  Surgeon: Nelva Bush, MD;  Location: [place] [facility];  Service: Cardiovascular;  Laterality: N/A;   LOWER EXTREMITY ANGIOGRAM  08/02/2016   LOWER EXTREMITY ANGIOGRAPHY N/A 08/02/2016   Procedure: Lower Extremity Angiography;  Surgeon: Nelva Bush, MD;  Location: [place] [facility];  Service: Cardiovascular;  Laterality: N/A;   VAGINAL HYSTERECTOMY  1991   "partial"    Social History   Socioeconomic History   Marital status: Married    Spouse name: Not on file   Number of children: 2   Years of education: 12   Highest education level: Not on file  Occupational History  Not on file  Tobacco Use   Smoking status: Every Day    Packs/day: 1.50    Years: 43.00    Pack years: 64.50    Types: Cigarettes    Start date: 05/17/1974   Smokeless tobacco: Never  Vaping Use   Vaping Use: Never used  Substance and Sexual Activity   Alcohol use: No    Alcohol/week: 0.0 standard drinks   Drug use: No   Sexual activity: Never  Other Topics Concern   Not on file  Social History Narrative   Patient does not drink caffeine.   Patient is right handed.    Social Determinants of Health   Financial Resource Strain: Not on file  Food Insecurity: Not on file  Transportation Needs: Not on file  Physical Activity: Not on file  Stress: Not on file  Social Connections: Not on file    Family History  Problem Relation Age of Onset   Congestive Heart Failure Mother    COPD Father    Cancer Brother    Cancer Brother     Outpatient Encounter Medications as of 04/02/2021  Medication Sig   acetaminophen (TYLENOL) 325 MG tablet Take 2 tablets (650 mg total) by mouth every 4 (four) hours as needed for headache or mild pain.   albuterol (VENTOLIN HFA)  108 (90 Base) MCG/ACT inhaler albuterol sulfate HFA 90 mcg/actuation aerosol inhaler  INHALE ONE TO 2 PUFFS BY INHALATION EVERY FOUR HOURS AS NEEDED   ALPRAZolam (XANAX) 0.5 MG tablet Take 0.5 mg by mouth 2 (two) times daily as needed.   benzonatate (TESSALON) 100 MG capsule Take 100 mg by mouth 4 (four) times daily as needed.   Blood Glucose Monitoring Suppl (ACCU-CHEK GUIDE) w/Device KIT 1 Piece by Does not apply route as directed.   ciprofloxacin (CIPRO) 500 MG tablet Take 1 tablet (500 mg total) by mouth 2 (two) times daily. One po bid x 7 days   clopidogrel (PLAVIX) 75 MG tablet Take 1 tablet (75 mg total) by mouth daily.   dicyclomine (BENTYL) 10 MG capsule Take 1 capsule (10 mg total) by mouth 2 (two) times daily.   escitalopram (LEXAPRO) 20 MG tablet Take 20 mg by mouth daily.   gabapentin (NEURONTIN) 300 MG capsule Take 300 mg by mouth at bedtime.   glipiZIDE (GLUCOTROL XL) 2.5 MG 24 hr tablet TAKE 1 TABLET(2.5 MG) BY MOUTH DAILY WITH BREAKFAST   glucose blood (ACCU-CHEK GUIDE) test strip Use as instructed   LEXAPRO 10 MG tablet Take 20 mg by mouth daily.    lisinopril (ZESTRIL) 20 MG tablet TAKE 1 TABLET(20 MG) BY MOUTH DAILY   metoprolol tartrate (LOPRESSOR) 25 MG tablet Take 1 tablet (25 mg total) by mouth 2 (two) times daily.   metroNIDAZOLE (FLAGYL) 500 MG tablet Take 1 tablet (500 mg total) by mouth 2 (two) times daily. One po bid x 7 days   nitroGLYCERIN (NITROSTAT) 0.4 MG SL tablet Place 1 tablet (0.4 mg total) under the tongue every 5 (five) minutes as needed for chest pain.   nystatin cream (MYCOSTATIN) Apply 1 application topically 2 (two) times daily as needed.   ondansetron (ZOFRAN) 4 MG tablet TAKE 1 TABLET(4 MG) BY MOUTH TWICE DAILY AS NEEDED FOR NAUSEA OR VOMITING   oxyCODONE-acetaminophen (PERCOCET/ROXICET) 5-325 MG tablet Take 1 tablet by mouth 3 (three) times daily as needed.   pantoprazole (PROTONIX) 40 MG tablet Take 1 tablet (40 mg total) by mouth daily before  breakfast.   rosuvastatin (CRESTOR) 20  MG tablet TAKE 1 TABLET(20 MG) BY MOUTH DAILY   Semaglutide,0.25 or 0.5MG/DOS, 2 MG/1.5ML SOPN Inject 0.5 mg into the skin once a week.   Vitamin D, Ergocalciferol, (DRISDOL) 1.25 MG (50000 UNIT) CAPS capsule TAKE 1 CAPSULE BY MOUTH EVERY 7 DAYS   [DISCONTINUED] lidocaine (PF) (XYLOCAINE) 1 % injection 10 mL    No facility-administered encounter medications on file as of 04/02/2021.    ALLERGIES: Allergies  Allergen Reactions   Codeine Shortness Of Breath   Latex Itching   Sulfa Antibiotics Other (See Comments)    Reaction:  Unknown     VACCINATION STATUS: Immunization History  Administered Date(s) Administered   Influenza,inj,Quad PF,6+ Mos 11/29/2014   Pneumococcal Polysaccharide-23 11/29/2014    Diabetes She presents for her follow-up diabetic visit. She has type 2 diabetes mellitus. Onset time: She was diagnosed at approximate age of 27 years. Her disease course has been fluctuating. There are no hypoglycemic associated symptoms. Pertinent negatives for hypoglycemia include no confusion, headaches, pallor or seizures. Associated symptoms include foot paresthesias. Pertinent negatives for diabetes include no chest pain, no fatigue, no polydipsia, no polyphagia and no polyuria. There are no hypoglycemic complications. Symptoms are stable. Diabetic complications include a CVA, heart disease and nephropathy. Risk factors for coronary artery disease include dyslipidemia, diabetes mellitus, family history, hypertension, tobacco exposure, sedentary lifestyle and post-menopausal. Current diabetic treatment includes oral agent (monotherapy) (and Ozempic 0.5 mg weekly). She is compliant with treatment most of the time. Her weight is fluctuating minimally. She is following a generally healthy diet. When asked about meal planning, she reported none. She has not had a previous visit with a dietitian. She never participates in exercise. Her home blood glucose  trend is fluctuating minimally. Her breakfast blood glucose range is generally 140-180 mg/dl. Her overall blood glucose range is 140-180 mg/dl. (She presents today with her meter and logs showing above target fasting glycemic profile.  Her POCT A1c today is 7.9%, increasing from last visit of 6.6%.  She has had pneumonia since last visit and was on prednisone for some time and she also had a perianal abscess.  She denies any hypoglycemia.  Analysis of her meter shows 7-day average of 149, 14-day average of 152, 30-day average of 151, 90-day average of 143.) An ACE inhibitor/angiotensin II receptor blocker is being taken. She does not see a podiatrist.Eye exam is not current.  Hyperlipidemia This is a chronic problem. The current episode started more than 1 year ago. The problem is controlled. Recent lipid tests were reviewed and are normal. Exacerbating diseases include chronic renal disease and diabetes. Factors aggravating her hyperlipidemia include beta blockers, fatty foods and smoking. Pertinent negatives include no chest pain, myalgias or shortness of breath. Current antihyperlipidemic treatment includes statins. The current treatment provides mild improvement of lipids. Compliance problems include adherence to diet and adherence to exercise.  Risk factors for coronary artery disease include diabetes mellitus, dyslipidemia, hypertension, a sedentary lifestyle, post-menopausal and family history.  Hypertension This is a chronic problem. The current episode started more than 1 year ago. The problem has been gradually improving since onset. The problem is controlled. Pertinent negatives include no chest pain, headaches, palpitations or shortness of breath. There are no associated agents to hypertension. Risk factors for coronary artery disease include dyslipidemia, diabetes mellitus, sedentary lifestyle, smoking/tobacco exposure and post-menopausal state. Past treatments include ACE inhibitors and beta  blockers. The current treatment provides moderate improvement. Compliance problems include exercise and diet.  Hypertensive end-organ damage includes kidney  disease, CAD/MI and CVA. Identifiable causes of hypertension include chronic renal disease.   Review of systems  Constitutional: + minimally fluctuating weight,  current Body mass index is 23.89 kg/m. , no fatigue, no subjective hyperthermia, no subjective hypothermia Eyes: no blurry vision, no xerophthalmia ENT: no sore throat, no nodules palpated in throat, no dysphagia/odynophagia, no hoarseness Cardiovascular: no chest pain, no shortness of breath, no palpitations, no leg swelling Respiratory: no cough, no shortness of breath Gastrointestinal: no nausea/vomiting/diarrhea Musculoskeletal: no muscle/joint aches Skin: no rashes, no hyperemia, perianal abscess- was lanced yesterday in ED Neurological: no tremors, no dizziness, + numbness/tingling to BLE (feet) Psychiatric: no depression, no anxiety  Objective:    BP (!) 170/66    Pulse 67    Ht $R'5\' 6"'yb$  (1.676 m)    Wt 148 lb (67.1 kg)    SpO2 96%    BMI 23.89 kg/m   Wt Readings from Last 3 Encounters:  04/02/21 148 lb (67.1 kg)  04/01/21 147 lb 12.8 oz (67 kg)  03/30/21 147 lb 12.8 oz (67 kg)    BP Readings from Last 3 Encounters:  04/02/21 (!) 170/66  04/01/21 (!) 153/66  03/30/21 (!) 88/54     Physical Exam- Limited  Constitutional:  Body mass index is 23.89 kg/m. , not in acute distress, normal state of mind Eyes:  EOMI, no exophthalmos Neck: Supple Cardiovascular: RRR, no murmurs, rubs, or gallops, no edema Respiratory: Adequate breathing efforts, no crackles, rales, rhonchi, or wheezing Musculoskeletal: no gross deformities, strength intact in all four extremities, no gross restriction of joint movements Skin:  no rashes, no hyperemia Neurological: no tremor with outstretched hands   CMP     Component Value Date/Time   NA 137 03/30/2021 0803   K 4.7 03/30/2021  0803   CL 102 03/30/2021 0803   CO2 18 (L) 03/30/2021 0803   GLUCOSE 199 (H) 03/30/2021 0803   GLUCOSE 146 (H) 08/20/2019 0719   BUN 33 (H) 03/30/2021 0803   CREATININE 1.70 (H) 03/30/2021 0803   CREATININE 1.38 (H) 08/20/2019 0719   CALCIUM 9.7 03/30/2021 0803   PROT 6.7 03/30/2021 0803   ALBUMIN 4.1 03/30/2021 0803   AST 11 03/30/2021 0803   ALT 8 03/30/2021 0803   ALKPHOS 101 03/30/2021 0803   BILITOT 0.5 03/30/2021 0803   GFRNONAA 44 (L) 03/26/2020 0927   GFRNONAA 40 (L) 08/20/2019 0719   GFRAA 51 (L) 03/26/2020 0927   GFRAA 47 (L) 08/20/2019 0719     Diabetic Labs (most recent): Lab Results  Component Value Date   HGBA1C 7.9 (A) 04/02/2021   HGBA1C 6.6 (A) 12/01/2020   HGBA1C 7.1 (A) 07/31/2020    Lipid Panel     Component Value Date/Time   CHOL 161 03/30/2021 0803   TRIG 212 (H) 03/30/2021 0803   HDL 35 (L) 03/30/2021 0803   CHOLHDL 4.6 (H) 03/30/2021 0803   CHOLHDL 3.6 01/16/2019 0937   VLDL UNABLE TO CALCULATE IF TRIGLYCERIDE OVER 400 mg/dL 11/29/2014 0630   LDLCALC 90 03/30/2021 0803   LDLCALC 69 01/16/2019 0937   LABVLDL 36 03/30/2021 0803       Assessment & Plan:   1) DM type 2 causing vascular disease ([place])  - CAMYAH PULTZ has currently uncontrolled symptomatic type 2 DM since  66 years of age.  She presents today with her meter and logs showing above target fasting glycemic profile.  Her POCT A1c today is 7.9%, increasing from last visit of 6.6%.  She has  had pneumonia since last visit and was on prednisone for some time and she also had a perianal abscess.  She denies any hypoglycemia.  Analysis of her meter shows 7-day average of 149, 14-day average of 152, 30-day average of 151, 90-day average of 143.  -Recent labs reviewed showing worsening kidney function- will refer to nephrology for further evaluation and treatment.  - I had a long discussion with her about the progressive nature of diabetes and the pathology behind its  complications. -her diabetes is complicated by coronary artery disease, CVA, nephropathy, peripheral neuropathy, chronic heavy smoking, sedentary life and she remains at a high risk for more acute and chronic complications which include CAD, CVA, CKD, retinopathy, and neuropathy. These are all discussed in detail with her.  - Nutritional counseling repeated at each appointment due to patients tendency to fall back in to old habits.  - The patient admits there is a room for improvement in their diet and drink choices. -  Suggestion is made for the patient to avoid simple carbohydrates from their diet including Cakes, Sweet Desserts / Pastries, Ice Cream, Soda (diet and regular), Sweet Tea, Candies, Chips, Cookies, Sweet Pastries, Store Bought Juices, Alcohol in Excess of 1-2 drinks a day, Artificial Sweeteners, Coffee Creamer, and "Sugar-free" Products. This will help patient to have stable blood glucose profile and potentially avoid unintended weight gain.   - I encouraged the patient to switch to unprocessed or minimally processed complex starch and increased protein intake (animal or plant source), fruits, and vegetables.   - Patient is advised to stick to a routine mealtimes to eat 3 meals a day and avoid unnecessary snacks (to snack only to correct hypoglycemia).  - I have approached her with the following individualized plan to manage  her diabetes and patient agrees:   -She can continue her Glipizide 2.5 mg XL daily with breakfast and Ozempic 0.5 mg SQ weekly (samples provided from office-cannot tolerate higher doses due to smoking history) for now.  I advised her to work on diet and exercise to help with suspected prandial hyperglycemia as well as give up sodas.  -She is encouraged to continue monitoring blood glucose daily, before breakfast, and call the clinic if she has readings less than 70 or greater than 200 for 3 tests in a row.  - Specific targets for  A1c;  LDL, HDL, Triglycerides,  and  Waist Circumference were discussed with the patient.  2) Blood Pressure /Hypertension:  Her blood pressure is not controlled to target.  She is advised to continue Lisinopril 20 mg po daily and Metoprolol 25 mg po twice daily.  I encouraged her to follow up with her PCP and cardiologist.  3) Lipids/Hyperlipidemia:   Her most recent lipid panel from 03/30/21 shows controlled LDL of 90 and elevated triglycerides of 212.  She is advised to continue Crestor 20 mg po daily at bedtime.  Side effects and precautions discussed with her.  She is advised to avoid fried foods and butter.    4)  Weight/Diet:  Her Body mass index is 23.89 kg/m..  she is not a candidate for more weight loss.   Exercise, and detailed carbohydrates information provided  -  detailed on discharge instructions.  5) Chronic Care/Health Maintenance: -she is on ACEI/ARB and Statin medications and is encouraged to initiate and continue to follow up with Ophthalmology, Dentist, Podiatrist at least yearly or according to recommendations, and advised to quit smoking. I have recommended yearly flu vaccine and pneumonia vaccine  at least every 5 years; moderate intensity exercise for up to 150 minutes weekly; and sleep for at least 7 hours a day.  - she is advised to maintain close follow up with Redmond School, MD for primary care needs, as well as her other providers for optimal and coordinated care.  I also entered referral for nephrology given her worsening kidney disease.      I spent 44 minutes in the care of the patient today including review of labs from [place], Lipids, Thyroid Function, Hematology (current and previous including abstractions from other facilities); face-to-face time discussing  her blood glucose readings/logs, discussing hypoglycemia and hyperglycemia episodes and symptoms, medications doses, her options of short and long term treatment based on the latest standards of care / guidelines;  discussion about  incorporating lifestyle medicine;  and documenting the encounter.    Please refer to Patient Instructions for Blood Glucose Monitoring and Insulin/Medications Dosing Guide"  in media tab for additional information. Please  also refer to " Patient Self Inventory" in the Media  tab for reviewed elements of pertinent patient history.  Shanon Ace participated in the discussions, expressed understanding, and voiced agreement with the above plans.  All questions were answered to her satisfaction. she is encouraged to contact clinic should she have any questions or concerns prior to her return visit.   Follow up plan: - Return in about 3 months (around 07/01/2021) for Diabetes F/U with A1c in office, No previsit labs, Bring meter and logs.  Rayetta Pigg, [facility] [address] [place], [place] [address] Phone: [phone] Fax: [phone]  04/02/2021, 11:15 AM

## 2021-04-03 ENCOUNTER — Telehealth (INDEPENDENT_AMBULATORY_CARE_PROVIDER_SITE_OTHER): Payer: Self-pay | Admitting: *Deleted

## 2021-04-03 NOTE — Telephone Encounter (Signed)
Called and discussed with patient per Dr. Jenetta Downer -  She needs to complete the antibiotic treatment as directed for those 7 days, dizziness will improve after finishing antibiotics. Patient verbalized understanding.

## 2021-04-03 NOTE — Telephone Encounter (Signed)
Pt seen 1/16 for perianal abscess and went to ED 1/18. Was pt on cipro and flagyl bid for 7 days. States she has been dizzy since starting meds. And had a headache last night that did go away with ES tylenol. States she thinks it is the flagyl making her dizzy because she has taken cipro in the past without any problems.   [address]

## 2021-04-03 NOTE — Telephone Encounter (Signed)
She needs to complete the antibiotic treatment as directed for those 7 days, dizziness will improve after finishing antibiotics.

## 2021-04-06 ENCOUNTER — Other Ambulatory Visit (INDEPENDENT_AMBULATORY_CARE_PROVIDER_SITE_OTHER): Payer: Self-pay

## 2021-04-06 DIAGNOSIS — Z79891 Long term (current) use of opiate analgesic: Secondary | ICD-10-CM | POA: Diagnosis not present

## 2021-04-06 DIAGNOSIS — M545 Low back pain, unspecified: Secondary | ICD-10-CM | POA: Diagnosis not present

## 2021-04-06 DIAGNOSIS — R112 Nausea with vomiting, unspecified: Secondary | ICD-10-CM

## 2021-04-06 DIAGNOSIS — M25511 Pain in right shoulder: Secondary | ICD-10-CM | POA: Diagnosis not present

## 2021-04-06 DIAGNOSIS — F419 Anxiety disorder, unspecified: Secondary | ICD-10-CM | POA: Diagnosis not present

## 2021-04-06 MED ORDER — ONDANSETRON HCL 4 MG PO TABS: ORAL_TABLET | ORAL | 0 refills | Status: DC

## 2021-04-16 ENCOUNTER — Ambulatory Visit (INDEPENDENT_AMBULATORY_CARE_PROVIDER_SITE_OTHER): Payer: HMO

## 2021-04-16 DIAGNOSIS — I6523 Occlusion and stenosis of bilateral carotid arteries: Secondary | ICD-10-CM

## 2021-04-22 ENCOUNTER — Other Ambulatory Visit: Payer: Self-pay | Admitting: Nurse Practitioner

## 2021-05-05 ENCOUNTER — Other Ambulatory Visit: Payer: Self-pay | Admitting: "Endocrinology

## 2021-05-21 ENCOUNTER — Other Ambulatory Visit: Payer: Self-pay | Admitting: "Endocrinology

## 2021-05-21 NOTE — Telephone Encounter (Signed)
Pt is requesting a refill on her Vitamin D and Ozempic. Lyle ?

## 2021-05-22 ENCOUNTER — Other Ambulatory Visit: Payer: Self-pay | Admitting: Nurse Practitioner

## 2021-05-22 DIAGNOSIS — E1159 Type 2 diabetes mellitus with other circulatory complications: Secondary | ICD-10-CM

## 2021-05-22 MED ORDER — SEMAGLUTIDE(0.25 OR 0.5MG/DOS) 2 MG/1.5ML ~~LOC~~ SOPN: 0.5000 mg | PEN_INJECTOR | SUBCUTANEOUS | 2 refills | Status: DC

## 2021-06-03 ENCOUNTER — Other Ambulatory Visit (HOSPITAL_COMMUNITY): Payer: Self-pay | Admitting: Student

## 2021-06-03 DIAGNOSIS — I6523 Occlusion and stenosis of bilateral carotid arteries: Secondary | ICD-10-CM

## 2021-06-30 DIAGNOSIS — M25511 Pain in right shoulder: Secondary | ICD-10-CM | POA: Diagnosis not present

## 2021-06-30 DIAGNOSIS — F419 Anxiety disorder, unspecified: Secondary | ICD-10-CM | POA: Diagnosis not present

## 2021-06-30 DIAGNOSIS — Z79891 Long term (current) use of opiate analgesic: Secondary | ICD-10-CM | POA: Diagnosis not present

## 2021-06-30 DIAGNOSIS — M545 Low back pain, unspecified: Secondary | ICD-10-CM | POA: Diagnosis not present

## 2021-07-01 ENCOUNTER — Encounter: Payer: Self-pay | Admitting: Nurse Practitioner

## 2021-07-01 ENCOUNTER — Ambulatory Visit: Payer: HMO | Admitting: Nurse Practitioner

## 2021-07-01 VITALS — BP 121/73 | HR 80 | Ht 66.0 in | Wt 149.0 lb

## 2021-07-01 DIAGNOSIS — E559 Vitamin D deficiency, unspecified: Secondary | ICD-10-CM | POA: Diagnosis not present

## 2021-07-01 DIAGNOSIS — E1159 Type 2 diabetes mellitus with other circulatory complications: Secondary | ICD-10-CM

## 2021-07-01 DIAGNOSIS — E782 Mixed hyperlipidemia: Secondary | ICD-10-CM

## 2021-07-01 DIAGNOSIS — I10 Essential (primary) hypertension: Secondary | ICD-10-CM

## 2021-07-01 LAB — POCT GLYCOSYLATED HEMOGLOBIN (HGB A1C): HbA1c POC (<> result, manual entry): 8.1 % (ref 4.0–5.6)

## 2021-07-01 NOTE — Progress Notes (Signed)
? ?                                                             ?     07/01/2021, 11:41 AM ?                               ?Endocrinology follow-up note ? ? ?Subjective:  ? ? Patient ID: Grace Hess, female    DOB: 02/16/1956.  ?Grace Hess is being seen in follow-up for management of currently uncontrolled symptomatic diabetes requested by  Redmond School, MD. ? ? ?Past Medical History:  ?Diagnosis Date  ? Anxiety   ? Arthritis   ? "might have a touch in my fingers" (08/02/2016)  ? CAD (coronary artery disease)   ? a. s/p DES to RCA in 07/2016 with residual mid-LAD stenosis  ? Cervicalgia   ? Chest pain, unspecified   ? GERD (gastroesophageal reflux disease)   ? Headache   ? "bad ones after my stroke in 2016; only have them when I get bad news now" (08/02/2016)  ? Heart murmur   ? Heavy smoker (more than 20 cigarettes per day)   ? Scheduled for LDCT screening 02/11/15  ? Hepatitis A   ? "related to Janine Limbo"  ? History of hiatal hernia   ? History of kidney stones   ? "no cysto/OR" (12/10/2014)  ? Hypercholesteremia   ? Hypertension   ? Obesity   ? Reflux esophagitis   ? Stroke [facility])   ? "they said I had a silent stroke a few months back/MRI" (12/10/2014)  ? TIA (transient ischemic attack) 11/28/2014  ? [name] 11/28/2014  ? Type II diabetes mellitus ([place])   ? ? ?Past Surgical History:  ?Procedure Laterality Date  ? ABDOMINAL AORTOGRAM N/A 08/02/2016  ? Procedure: Abdominal Aortogram;  Surgeon: Nelva Bush, MD;  Location: [place] [facility];  Service: Cardiovascular;  Laterality: N/A;  ? CAROTID STENT    ? CHOLECYSTECTOMY OPEN  1978  ? COLONOSCOPY  2010  ? single diverticulum in sigmoid colon  ? CORONARY ANGIOPLASTY WITH STENT PLACEMENT  08/02/2016  ? to RCA  ? CORONARY STENT INTERVENTION N/A 08/02/2016  ? Procedure: Coronary Stent Intervention;  Surgeon: Nelva Bush, MD;  Location: [facility];  Service: Cardiovascular;  Laterality: N/A;  RCA  ? ENDARTERECTOMY Left 12/02/2014   ? Procedure: ENDARTERECTOMY CAROTID;  Surgeon: Rosetta Posner, MD;  Location: [place];  Service: Vascular;  Laterality: Left;  ? LEFT HEART CATH AND CORONARY ANGIOGRAPHY N/A 08/02/2016  ? Procedure: Left Heart Cath and Coronary Angiography;  Surgeon: Nelva Bush, MD;  Location: [place] [facility];  Service: Cardiovascular;  Laterality: N/A;  ? LOWER EXTREMITY ANGIOGRAM  08/02/2016  ? LOWER EXTREMITY ANGIOGRAPHY N/A 08/02/2016  ? Procedure: Lower Extremity Angiography;  Surgeon: Nelva Bush, MD;  Location: [place] [facility];  Service: Cardiovascular;  Laterality: N/A;  ? VAGINAL HYSTERECTOMY  1991  ? "partial"  ? ? ?Social History  ? ?Socioeconomic History  ? Marital status: Married  ?  Spouse name: Not on file  ? Number of children: 2  ? Years of education: 51  ? Highest education level: Not on file  ?Occupational History  ?  Not on file  ?Tobacco Use  ? Smoking status: Every Day  ?  Packs/day: 1.50  ?  Years: 43.00  ?  Pack years: 64.50  ?  Types: Cigarettes  ?  Start date: 05/17/1974  ? Smokeless tobacco: Never  ?Vaping Use  ? Vaping Use: Never used  ?Substance and Sexual Activity  ? Alcohol use: No  ?  Alcohol/week: 0.0 standard drinks  ? Drug use: No  ? Sexual activity: Never  ?Other Topics Concern  ? Not on file  ?Social History Narrative  ? Patient does not drink caffeine.  ? Patient is right handed.   ? ?Social Determinants of Health  ? ?Financial Resource Strain: Not on file  ?Food Insecurity: Not on file  ?Transportation Needs: Not on file  ?Physical Activity: Not on file  ?Stress: Not on file  ?Social Connections: Not on file  ? ? ?Family History  ?Problem Relation Age of Onset  ? Congestive Heart Failure Mother   ? COPD Father   ? Cancer Brother   ? Cancer Brother   ? ? ?Outpatient Encounter Medications as of 07/01/2021  ?Medication Sig  ? Cholecalciferol (VITAMIN D3) 125 MCG (5000 UT) CAPS Take by mouth daily.  ? acetaminophen (TYLENOL) 325 MG tablet Take 2 tablets (650 mg total) by mouth every 4  (four) hours as needed for headache or mild pain.  ? albuterol (VENTOLIN HFA) 108 (90 Base) MCG/ACT inhaler albuterol sulfate HFA 90 mcg/actuation aerosol inhaler ? INHALE ONE TO 2 PUFFS BY INHALATION EVERY FOUR HOURS AS NEEDED  ? ALPRAZolam (XANAX) 0.5 MG tablet Take 0.5 mg by mouth 2 (two) times daily as needed.  ? benzonatate (TESSALON) 100 MG capsule Take 100 mg by mouth 4 (four) times daily as needed.  ? Blood Glucose Monitoring Suppl (ACCU-CHEK GUIDE) w/Device KIT 1 Piece by Does not apply route as directed.  ? clopidogrel (PLAVIX) 75 MG tablet Take 1 tablet (75 mg total) by mouth daily.  ? dicyclomine (BENTYL) 10 MG capsule Take 1 capsule (10 mg total) by mouth 2 (two) times daily.  ? escitalopram (LEXAPRO) 20 MG tablet Take 20 mg by mouth daily.  ? gabapentin (NEURONTIN) 300 MG capsule Take 300 mg by mouth at bedtime.  ? glipiZIDE (GLUCOTROL XL) 2.5 MG 24 hr tablet TAKE 1 TABLET(2.5 MG) BY MOUTH DAILY WITH BREAKFAST  ? glucose blood (ACCU-CHEK GUIDE) test strip Use as instructed  ? LEXAPRO 10 MG tablet Take 20 mg by mouth daily.   ? lisinopril (ZESTRIL) 20 MG tablet TAKE 1 TABLET(20 MG) BY MOUTH DAILY  ? metoprolol tartrate (LOPRESSOR) 25 MG tablet Take 1 tablet (25 mg total) by mouth 2 (two) times daily.  ? metroNIDAZOLE (FLAGYL) 500 MG tablet Take 1 tablet (500 mg total) by mouth 2 (two) times daily. One po bid x 7 days  ? nitroGLYCERIN (NITROSTAT) 0.4 MG SL tablet Place 1 tablet (0.4 mg total) under the tongue every 5 (five) minutes as needed for chest pain.  ? nystatin cream (MYCOSTATIN) Apply 1 application topically 2 (two) times daily as needed.  ? ondansetron (ZOFRAN) 4 MG tablet TAKE 1 TABLET(4 MG) BY MOUTH TWICE DAILY AS NEEDED FOR NAUSEA OR VOMITING  ? oxyCODONE-acetaminophen (PERCOCET/ROXICET) 5-325 MG tablet Take 1 tablet by mouth 3 (three) times daily as needed.  ? pantoprazole (PROTONIX) 40 MG tablet Take 1 tablet (40 mg total) by mouth daily before breakfast.  ? rosuvastatin (CRESTOR) 20 MG  tablet TAKE 1 TABLET(20 MG) BY MOUTH DAILY  ?  Semaglutide,0.25 or 0.5MG/DOS, 2 MG/1.5ML SOPN Inject 0.5 mg into the skin once a week.  ? [DISCONTINUED] ciprofloxacin (CIPRO) 500 MG tablet Take 1 tablet (500 mg total) by mouth 2 (two) times daily. One po bid x 7 days  ? [DISCONTINUED] Vitamin D, Ergocalciferol, (DRISDOL) 1.25 MG (50000 UNIT) CAPS capsule TAKE 1 CAPSULE BY MOUTH EVERY 7 DAYS  ? ?No facility-administered encounter medications on file as of 07/01/2021.  ? ? ?ALLERGIES: ?Allergies  ?Allergen Reactions  ? Codeine Shortness Of Breath  ? Latex Itching  ? Sulfa Antibiotics Other (See Comments)  ?  Reaction:  Unknown   ? ? ?VACCINATION STATUS: ?Immunization History  ?Administered Date(s) Administered  ? Influenza,inj,Quad PF,6+ Mos 11/29/2014  ? Pneumococcal Polysaccharide-23 11/29/2014  ? ? ?Diabetes ?She presents for her follow-up diabetic visit. She has type 2 diabetes mellitus. Onset time: She was diagnosed at approximate age of 58 years. Her disease course has been fluctuating. There are no hypoglycemic associated symptoms. Pertinent negatives for hypoglycemia include no confusion, headaches, pallor or seizures. Associated symptoms include foot paresthesias. Pertinent negatives for diabetes include no chest pain, no fatigue, no polydipsia, no polyphagia and no polyuria. There are no hypoglycemic complications. Symptoms are stable. Diabetic complications include a CVA, heart disease and nephropathy. Risk factors for coronary artery disease include dyslipidemia, diabetes mellitus, family history, hypertension, tobacco exposure, sedentary lifestyle and post-menopausal. Current diabetic treatment includes oral agent (monotherapy) (and Ozempic 0.5 mg weekly). She is compliant with treatment most of the time. Her weight is fluctuating minimally. She is following a generally healthy diet. When asked about meal planning, she reported none. She has not had a previous visit with a dietitian. She never participates  in exercise. Her home blood glucose trend is fluctuating minimally. Her breakfast blood glucose range is generally 140-180 mg/dl. Her overall blood glucose range is 140-180 mg/dl. (She presents today with

## 2021-07-01 NOTE — Patient Instructions (Signed)
Diabetes Mellitus and Foot Care Foot care is an important part of your health, especially when you have diabetes. Diabetes may cause you to have problems because of poor blood flow (circulation) to your feet and legs, which can cause your skin to: Become thinner and drier. Break more easily. Heal more slowly. Peel and crack. You may also have nerve damage (neuropathy) in your legs and feet, causing decreased feeling in them. This means that you may not notice minor injuries to your feet that could lead to more serious problems. Noticing and addressing any potential problems early is the best way to prevent future foot problems. How to care for your feet Foot hygiene  Wash your feet daily with warm water and mild soap. Do not use hot water. Then, pat your feet and the areas between your toes until they are completely dry. Do not soak your feet as this can dry your skin. Trim your toenails straight across. Do not dig under them or around the cuticle. File the edges of your nails with an emery board or nail file. Apply a moisturizing lotion or petroleum jelly to the skin on your feet and to dry, brittle toenails. Use lotion that does not contain alcohol and is unscented. Do not apply lotion between your toes. Shoes and socks Wear clean socks or stockings every day. Make sure they are not too tight. Do not wear knee-high stockings since they may decrease blood flow to your legs. Wear shoes that fit properly and have enough cushioning. Always look in your shoes before you put them on to be sure there are no objects inside. To break in new shoes, wear them for just a few hours a day. This prevents injuries on your feet. Wounds, scrapes, corns, and calluses  Check your feet daily for blisters, cuts, bruises, sores, and redness. If you cannot see the bottom of your feet, use a mirror or ask someone for help. Do not cut corns or calluses or try to remove them with medicine. If you find a minor scrape,  cut, or break in the skin on your feet, keep it and the skin around it clean and dry. You may clean these areas with mild soap and water. Do not clean the area with peroxide, alcohol, or iodine. If you have a wound, scrape, corn, or callus on your foot, look at it several times a day to make sure it is healing and not infected. Check for: Redness, swelling, or pain. Fluid or blood. Warmth. Pus or a bad smell. General tips Do not cross your legs. This may decrease blood flow to your feet. Do not use heating pads or hot water bottles on your feet. They may burn your skin. If you have lost feeling in your feet or legs, you may not know this is happening until it is too late. Protect your feet from hot and cold by wearing shoes, such as at the beach or on hot pavement. Schedule a complete foot exam at least once a year (annually) or more often if you have foot problems. Report any cuts, sores, or bruises to your health care provider immediately. Where to find more information American Diabetes Association: www.diabetes.org Association of Diabetes Care & Education Specialists: www.diabeteseducator.org Contact a health care provider if: You have a medical condition that increases your risk of infection and you have any cuts, sores, or bruises on your feet. You have an injury that is not healing. You have redness on your legs or feet. You   feel burning or tingling in your legs or feet. You have pain or cramps in your legs and feet. Your legs or feet are numb. Your feet always feel cold. You have pain around any toenails. Get help right away if: You have a wound, scrape, corn, or callus on your foot and: You have pain, swelling, or redness that gets worse. You have fluid or blood coming from the wound, scrape, corn, or callus. Your wound, scrape, corn, or callus feels warm to the touch. You have pus or a bad smell coming from the wound, scrape, corn, or callus. You have a fever. You have a red  line going up your leg. Summary Check your feet every day for blisters, cuts, bruises, sores, and redness. Apply a moisturizing lotion or petroleum jelly to the skin on your feet and to dry, brittle toenails. Wear shoes that fit properly and have enough cushioning. If you have foot problems, report any cuts, sores, or bruises to your health care provider immediately. Schedule a complete foot exam at least once a year (annually) or more often if you have foot problems. This information is not intended to replace advice given to you by your health care provider. Make sure you discuss any questions you have with your health care provider. Document Revised: 09/20/2019 Document Reviewed: 09/20/2019 Elsevier Patient Education  2023 Elsevier Inc.  

## 2021-07-08 ENCOUNTER — Other Ambulatory Visit: Payer: Self-pay | Admitting: Nurse Practitioner

## 2021-07-08 DIAGNOSIS — E1159 Type 2 diabetes mellitus with other circulatory complications: Secondary | ICD-10-CM

## 2021-07-13 ENCOUNTER — Other Ambulatory Visit: Payer: Self-pay

## 2021-07-13 ENCOUNTER — Encounter (HOSPITAL_COMMUNITY): Payer: Self-pay | Admitting: *Deleted

## 2021-07-13 ENCOUNTER — Emergency Department (HOSPITAL_COMMUNITY)
Admission: EM | Admit: 2021-07-13 | Discharge: 2021-07-13 | Disposition: A | Discharge status: Home / Self Care | Payer: HMO | Source: Home / Self Care | Attending: Emergency Medicine | Admitting: Emergency Medicine

## 2021-07-13 ENCOUNTER — Emergency Department (HOSPITAL_COMMUNITY): Payer: HMO

## 2021-07-13 DIAGNOSIS — N131 Hydronephrosis with ureteral stricture, not elsewhere classified: Secondary | ICD-10-CM | POA: Diagnosis not present

## 2021-07-13 DIAGNOSIS — N1832 Chronic kidney disease, stage 3b: Secondary | ICD-10-CM | POA: Diagnosis present

## 2021-07-13 DIAGNOSIS — R350 Frequency of micturition: Secondary | ICD-10-CM | POA: Insufficient documentation

## 2021-07-13 DIAGNOSIS — F418 Other specified anxiety disorders: Secondary | ICD-10-CM | POA: Diagnosis not present

## 2021-07-13 DIAGNOSIS — K219 Gastro-esophageal reflux disease without esophagitis: Secondary | ICD-10-CM | POA: Diagnosis not present

## 2021-07-13 DIAGNOSIS — N179 Acute kidney failure, unspecified: Secondary | ICD-10-CM | POA: Insufficient documentation

## 2021-07-13 DIAGNOSIS — N184 Chronic kidney disease, stage 4 (severe): Secondary | ICD-10-CM | POA: Diagnosis not present

## 2021-07-13 DIAGNOSIS — M47816 Spondylosis without myelopathy or radiculopathy, lumbar region: Secondary | ICD-10-CM | POA: Diagnosis not present

## 2021-07-13 DIAGNOSIS — Z9861 Coronary angioplasty status: Secondary | ICD-10-CM | POA: Diagnosis not present

## 2021-07-13 DIAGNOSIS — Z885 Allergy status to narcotic agent status: Secondary | ICD-10-CM | POA: Diagnosis not present

## 2021-07-13 DIAGNOSIS — Z91040 Latex allergy status: Secondary | ICD-10-CM | POA: Diagnosis not present

## 2021-07-13 DIAGNOSIS — E119 Type 2 diabetes mellitus without complications: Secondary | ICD-10-CM | POA: Insufficient documentation

## 2021-07-13 DIAGNOSIS — D49512 Neoplasm of unspecified behavior of left kidney: Secondary | ICD-10-CM | POA: Diagnosis not present

## 2021-07-13 DIAGNOSIS — Z20822 Contact with and (suspected) exposure to covid-19: Secondary | ICD-10-CM | POA: Diagnosis present

## 2021-07-13 DIAGNOSIS — N2889 Other specified disorders of kidney and ureter: Secondary | ICD-10-CM | POA: Diagnosis present

## 2021-07-13 DIAGNOSIS — E1122 Type 2 diabetes mellitus with diabetic chronic kidney disease: Secondary | ICD-10-CM | POA: Diagnosis present

## 2021-07-13 DIAGNOSIS — I129 Hypertensive chronic kidney disease with stage 1 through stage 4 chronic kidney disease, or unspecified chronic kidney disease: Secondary | ICD-10-CM | POA: Diagnosis present

## 2021-07-13 DIAGNOSIS — K753 Granulomatous hepatitis, not elsewhere classified: Secondary | ICD-10-CM | POA: Diagnosis not present

## 2021-07-13 DIAGNOSIS — Z8673 Personal history of transient ischemic attack (TIA), and cerebral infarction without residual deficits: Secondary | ICD-10-CM | POA: Diagnosis not present

## 2021-07-13 DIAGNOSIS — F419 Anxiety disorder, unspecified: Secondary | ICD-10-CM | POA: Diagnosis present

## 2021-07-13 DIAGNOSIS — F17210 Nicotine dependence, cigarettes, uncomplicated: Secondary | ICD-10-CM | POA: Diagnosis present

## 2021-07-13 DIAGNOSIS — E7800 Pure hypercholesterolemia, unspecified: Secondary | ICD-10-CM | POA: Diagnosis present

## 2021-07-13 DIAGNOSIS — F32A Depression, unspecified: Secondary | ICD-10-CM | POA: Diagnosis present

## 2021-07-13 DIAGNOSIS — G8929 Other chronic pain: Secondary | ICD-10-CM | POA: Diagnosis present

## 2021-07-13 DIAGNOSIS — Z7984 Long term (current) use of oral hypoglycemic drugs: Secondary | ICD-10-CM | POA: Insufficient documentation

## 2021-07-13 DIAGNOSIS — I10 Essential (primary) hypertension: Secondary | ICD-10-CM | POA: Diagnosis not present

## 2021-07-13 DIAGNOSIS — R109 Unspecified abdominal pain: Secondary | ICD-10-CM | POA: Diagnosis not present

## 2021-07-13 DIAGNOSIS — N132 Hydronephrosis with renal and ureteral calculous obstruction: Secondary | ICD-10-CM | POA: Diagnosis present

## 2021-07-13 DIAGNOSIS — I2510 Atherosclerotic heart disease of native coronary artery without angina pectoris: Secondary | ICD-10-CM | POA: Diagnosis present

## 2021-07-13 DIAGNOSIS — I959 Hypotension, unspecified: Secondary | ICD-10-CM | POA: Diagnosis present

## 2021-07-13 DIAGNOSIS — N1330 Unspecified hydronephrosis: Secondary | ICD-10-CM | POA: Diagnosis not present

## 2021-07-13 DIAGNOSIS — N200 Calculus of kidney: Secondary | ICD-10-CM | POA: Diagnosis not present

## 2021-07-13 DIAGNOSIS — Z955 Presence of coronary angioplasty implant and graft: Secondary | ICD-10-CM | POA: Diagnosis not present

## 2021-07-13 DIAGNOSIS — E1151 Type 2 diabetes mellitus with diabetic peripheral angiopathy without gangrene: Secondary | ICD-10-CM | POA: Diagnosis present

## 2021-07-13 DIAGNOSIS — E1165 Type 2 diabetes mellitus with hyperglycemia: Secondary | ICD-10-CM | POA: Diagnosis present

## 2021-07-13 DIAGNOSIS — Z79899 Other long term (current) drug therapy: Secondary | ICD-10-CM | POA: Insufficient documentation

## 2021-07-13 DIAGNOSIS — Z882 Allergy status to sulfonamides status: Secondary | ICD-10-CM | POA: Diagnosis not present

## 2021-07-13 DIAGNOSIS — N1339 Other hydronephrosis: Secondary | ICD-10-CM | POA: Diagnosis not present

## 2021-07-13 DIAGNOSIS — N178 Other acute kidney failure: Secondary | ICD-10-CM

## 2021-07-13 LAB — URINALYSIS, ROUTINE W REFLEX MICROSCOPIC
Bilirubin Urine: NEGATIVE
Glucose, UA: 50 mg/dL — AB
Ketones, ur: NEGATIVE mg/dL
Leukocytes,Ua: NEGATIVE
Nitrite: NEGATIVE
Protein, ur: 100 mg/dL — AB
Specific Gravity, Urine: 1.015 (ref 1.005–1.030)
pH: 5 (ref 5.0–8.0)

## 2021-07-13 LAB — BASIC METABOLIC PANEL
Anion gap: 10 (ref 5–15)
BUN: 62 mg/dL — ABNORMAL HIGH (ref 8–23)
CO2: 23 mmol/L (ref 22–32)
Calcium: 9 mg/dL (ref 8.9–10.3)
Chloride: 103 mmol/L (ref 98–111)
Creatinine, Ser: 2.85 mg/dL — ABNORMAL HIGH (ref 0.44–1.00)
GFR, Estimated: 18 mL/min — ABNORMAL LOW (ref >60)
Glucose, Bld: 228 mg/dL — ABNORMAL HIGH (ref 70–99)
Potassium: 4.5 mmol/L (ref 3.5–5.1)
Sodium: 136 mmol/L (ref 135–145)

## 2021-07-13 LAB — CBC WITH DIFFERENTIAL/PLATELET
Abs Immature Granulocytes: 0.18 10*3/uL — ABNORMAL HIGH (ref 0.00–0.07)
Basophils Absolute: 0.1 10*3/uL (ref 0.0–0.1)
Basophils Relative: 1 %
Eosinophils Absolute: 0.2 10*3/uL (ref 0.0–0.5)
Eosinophils Relative: 1 %
HCT: 36.8 % (ref 36.0–46.0)
Hemoglobin: 11.8 g/dL — ABNORMAL LOW (ref 12.0–15.0)
Immature Granulocytes: 1 %
Lymphocytes Relative: 14 %
Lymphs Abs: 2 10*3/uL (ref 0.7–4.0)
MCH: 29.7 pg (ref 26.0–34.0)
MCHC: 32.1 g/dL (ref 30.0–36.0)
MCV: 92.7 fL (ref 80.0–100.0)
Monocytes Absolute: 1.4 10*3/uL — ABNORMAL HIGH (ref 0.1–1.0)
Monocytes Relative: 11 %
Neutro Abs: 9.8 10*3/uL — ABNORMAL HIGH (ref 1.7–7.7)
Neutrophils Relative %: 72 %
Platelets: 250 10*3/uL (ref 150–400)
RBC: 3.97 MIL/uL (ref 3.87–5.11)
RDW: 13.9 % (ref 11.5–15.5)
WBC: 13.7 10*3/uL — ABNORMAL HIGH (ref 4.0–10.5)
nRBC: 0 % (ref 0.0–0.2)

## 2021-07-13 MED ORDER — OXYCODONE HCL 5 MG PO TABS
5.0000 mg | ORAL_TABLET | Freq: Once | ORAL | Status: AC
  Administered 2021-07-13: 5 mg via ORAL
  Filled 2021-07-13: qty 1

## 2021-07-13 NOTE — Discharge Instructions (Signed)
As discussed we strongly want you to be admitted to the hospital this evening for further testing of your kidney mass but also to treat your acute renal failure to prevent it from getting worse.  Please return here tomorrow as you are planning for admission if not sooner. ?

## 2021-07-13 NOTE — ED Triage Notes (Signed)
Pt states she started having left side abdominal pain that started Saturday and has moved to groin area; pt has a hx of kidney stones ?

## 2021-07-13 NOTE — ED Provider Notes (Signed)
?[facility] ?Provider Note ? ? ?CSN: [id number] ?Arrival date & time: 07/13/21  1140 ? ?  ? ?History ? ?Chief Complaint  ?Patient presents with  ? Abdominal Pain  ? ? ?Grace Hess is a 66 y.o. female with a history of hypertension, GERD, type 2 diabetes, history of TIA, IBS and peripheral arterial disease, also history of kidney stones presenting with a 3-day history of left sided flank pain which radiates into her left groin area, also describing urinary frequency but denies dysuria and hematuria.  She has had no nausea or vomiting, denies fevers or chills.  She does take half a tablet of oxycodone as needed for chronic pain and she has had several doses of this since onset of symptoms and does get transient relief of pain.  She states her symptoms are similar to prior kidney stone passage. ? ?The history is provided by the patient and the spouse.  ? ?  ? ?Home Medications ?Prior to Admission medications   ?Medication Sig Start Date End Date Taking? Authorizing Provider  ?acetaminophen (TYLENOL) 325 MG tablet Take 2 tablets (650 mg total) by mouth every 4 (four) hours as needed for headache or mild pain. 08/03/16   Erlene Quan, PA-C  ?albuterol (VENTOLIN HFA) 108 (90 Base) MCG/ACT inhaler albuterol sulfate HFA 90 mcg/actuation aerosol inhaler ? INHALE ONE TO 2 PUFFS BY INHALATION EVERY FOUR HOURS AS NEEDED    [provider]  ?ALPRAZolam (XANAX) 0.5 MG tablet Take 0.5 mg by mouth 2 (two) times daily as needed. 03/20/19   [provider]  ?benzonatate (TESSALON) 100 MG capsule Take 100 mg by mouth 4 (four) times daily as needed. 03/03/21   [provider]  ?Blood Glucose Monitoring Suppl (ACCU-CHEK GUIDE) w/Device KIT 1 Piece by Does not apply route as directed. 08/23/19   Cassandria Anger, MD  ?Cholecalciferol (VITAMIN D3) 125 MCG (5000 UT) CAPS Take by mouth daily.    [provider]  ?clopidogrel (PLAVIX) 75 MG tablet Take 1 tablet (75 mg total) by  mouth daily. 12/03/14   Rai, Vernelle Emerald, MD  ?dicyclomine (BENTYL) 10 MG capsule Take 1 capsule (10 mg total) by mouth 2 (two) times daily. 03/28/17   Rehman, Mechele Dawley, MD  ?escitalopram (LEXAPRO) 20 MG tablet Take 20 mg by mouth daily. 01/01/21   [provider]  ?gabapentin (NEURONTIN) 300 MG capsule Take 300 mg by mouth at bedtime.    [provider]  ?glipiZIDE (GLUCOTROL XL) 2.5 MG 24 hr tablet TAKE 1 TABLET(2.5 MG) BY MOUTH DAILY WITH BREAKFAST 04/22/21   Brita Romp, NP  ?glucose blood (ACCU-CHEK GUIDE) test strip Use as instructed 08/23/19   Cassandria Anger, MD  ?LEXAPRO 10 MG tablet Take 20 mg by mouth daily.  04/18/19   [provider]  ?lisinopril (ZESTRIL) 20 MG tablet TAKE 1 TABLET(20 MG) BY MOUTH DAILY 02/23/21   Arnoldo Lenis, MD  ?metoprolol tartrate (LOPRESSOR) 25 MG tablet Take 1 tablet (25 mg total) by mouth 2 (two) times daily. 03/19/21   Strader, Fransisco Hertz, PA-C  ?metroNIDAZOLE (FLAGYL) 500 MG tablet Take 1 tablet (500 mg total) by mouth 2 (two) times daily. One po bid x 7 days 04/01/21   Etta Quill, NP  ?nitroGLYCERIN (NITROSTAT) 0.4 MG SL tablet Place 1 tablet (0.4 mg total) under the tongue every 5 (five) minutes as needed for chest pain. 10/25/19   Arnoldo Lenis, MD  ?nystatin cream (MYCOSTATIN) Apply 1 application topically  2 (two) times daily as needed. 07/10/16   [provider]  ?ondansetron (ZOFRAN) 4 MG tablet TAKE 1 TABLET(4 MG) BY MOUTH TWICE DAILY AS NEEDED FOR NAUSEA OR VOMITING 04/06/21   Montez Morita, Quillian Quince, MD  ?oxyCODONE-acetaminophen (PERCOCET/ROXICET) 5-325 MG tablet Take 1 tablet by mouth 3 (three) times daily as needed. 08/20/19   [provider]  ?OZEMPIC, 0.25 OR 0.5 MG/DOSE, 2 MG/1.5ML SOPN INJECT 0.5MG INTO THE SKIN ONCE WEEKLY 07/08/21   Brita Romp, NP  ?pantoprazole (PROTONIX) 40 MG tablet Take 1 tablet (40 mg total) by mouth daily before breakfast. 01/13/21   Gabriel Rung, NP  ?rosuvastatin  (CRESTOR) 20 MG tablet TAKE 1 TABLET(20 MG) BY MOUTH DAILY 02/23/21   Arnoldo Lenis, MD  ?   ? ?Allergies    ?Codeine, Latex, and Sulfa antibiotics   ? ?Review of Systems   ?Review of Systems  ?Constitutional:  Negative for chills and fever.  ?HENT:  Negative for congestion and sore throat.   ?Eyes: Negative.   ?Respiratory:  Negative for chest tightness and shortness of breath.   ?Cardiovascular:  Negative for chest pain.  ?Gastrointestinal:  Positive for abdominal pain. Negative for nausea and vomiting.  ?Genitourinary:  Positive for flank pain and frequency. Negative for dysuria, urgency, vaginal discharge and vaginal pain.  ?Musculoskeletal:  Negative for arthralgias, joint swelling and neck pain.  ?Skin: Negative.  Negative for rash and wound.  ?Neurological:  Negative for dizziness, weakness, light-headedness, numbness and headaches.  ?Psychiatric/Behavioral: Negative.    ? ?Physical Exam ?Updated Vital Signs ?BP (!) 122/50 (BP Location: Left Arm)   Pulse 62   Temp 97.8 ?F (36.6 ?C) (Oral)   Resp 16   Ht _0  (1.676 m)   Wt 67 kg   SpO2 94%   BMI 23.84 kg/m?  ?Physical Exam ?Vitals and nursing note reviewed.  ?Constitutional:   ?   Appearance: She is well-developed.  ?HENT:  ?   Head: Normocephalic and atraumatic.  ?Eyes:  ?   Conjunctiva/sclera: Conjunctivae normal.  ?Cardiovascular:  ?   Rate and Rhythm: Normal rate and regular rhythm.  ?   Heart sounds: Normal heart sounds.  ?Pulmonary:  ?   Effort: Pulmonary effort is normal.  ?   Breath sounds: Normal breath sounds. No wheezing.  ?Abdominal:  ?   General: Bowel sounds are normal.  ?   Palpations: Abdomen is soft.  ?   Tenderness: There is abdominal tenderness in the left lower quadrant. There is left CVA tenderness. There is no guarding or rebound.  ?   Hernia: No hernia is present.  ?Musculoskeletal:     ?   General: Normal range of motion.  ?   Cervical back: Normal range of motion.  ?Skin: ?   General: Skin is warm and dry.  ?Neurological:   ?   Mental Status: She is alert.  ? ? ?ED Results / Procedures / Treatments   ?Labs ?(all labs ordered are listed, but only abnormal results are displayed) ?Labs Reviewed  ?URINALYSIS, ROUTINE W REFLEX MICROSCOPIC - Abnormal; Notable for the following components:  ?    Result Value  ? Glucose, UA 50 (*)   ? Hgb urine dipstick SMALL (*)   ? Protein, ur 100 (*)   ? Bacteria, UA RARE (*)   ? All other components within normal limits  ?BASIC METABOLIC PANEL - Abnormal; Notable for the following components:  ? Glucose, Bld 228 (*)   ? BUN 62 (*)   ?  Creatinine, Ser 2.85 (*)   ? GFR, Estimated 18 (*)   ? All other components within normal limits  ?CBC WITH DIFFERENTIAL/PLATELET - Abnormal; Notable for the following components:  ? WBC 13.7 (*)   ? Hemoglobin 11.8 (*)   ? Neutro Abs 9.8 (*)   ? Monocytes Absolute 1.4 (*)   ? Abs Immature Granulocytes 0.18 (*)   ? All other components within normal limits  ? ? ?EKG ?None ? ?Radiology ?CT Renal Stone Study ? ?Result Date: 07/13/2021 ?CLINICAL DATA:  Left-sided abdominal pain and groin pain with nausea x3 days. EXAM: CT ABDOMEN AND PELVIS WITHOUT CONTRAST TECHNIQUE: Multidetector CT imaging of the abdomen and pelvis was performed following the standard protocol without IV contrast. RADIATION DOSE REDUCTION: This exam was performed according to the departmental dose-optimization program which includes automated exposure control, adjustment of the mA and/or kV according to patient size and/or use of iterative reconstruction technique. COMPARISON:  April 01, 2021 and October 22, 2013 FINDINGS: Lower chest: No acute abnormality. Hepatobiliary: A punctate calcified granuloma is seen within the anteromedial aspect of the right lobe of the liver. Status post cholecystectomy. No biliary dilatation. Pancreas: Unremarkable. No pancreatic ductal dilatation or surrounding inflammatory changes. Spleen: Normal in size without focal abnormality. Adrenals/Urinary Tract: Mild bilateral adrenal  gland thickening and nodularity is seen. There is stable cortical thinning of the lower pole of the right kidney, with a subcentimeter parenchymal calcifications and duplication of the right-sided renal collect

## 2021-07-13 NOTE — ED Notes (Signed)
Patient transported to CT 

## 2021-07-14 ENCOUNTER — Encounter (HOSPITAL_COMMUNITY): Payer: Self-pay

## 2021-07-14 ENCOUNTER — Inpatient Hospital Stay (HOSPITAL_COMMUNITY)
Admission: EM | Admit: 2021-07-14 | Discharge: 2021-07-17 | DRG: 661 | Disposition: A | Discharge status: Home / Self Care | Payer: HMO | Attending: Internal Medicine | Admitting: Internal Medicine

## 2021-07-14 DIAGNOSIS — Z79899 Other long term (current) drug therapy: Secondary | ICD-10-CM

## 2021-07-14 DIAGNOSIS — N1330 Unspecified hydronephrosis: Secondary | ICD-10-CM | POA: Diagnosis not present

## 2021-07-14 DIAGNOSIS — K589 Irritable bowel syndrome without diarrhea: Secondary | ICD-10-CM | POA: Diagnosis present

## 2021-07-14 DIAGNOSIS — N1339 Other hydronephrosis: Secondary | ICD-10-CM | POA: Diagnosis not present

## 2021-07-14 DIAGNOSIS — K219 Gastro-esophageal reflux disease without esophagitis: Secondary | ICD-10-CM | POA: Diagnosis present

## 2021-07-14 DIAGNOSIS — E1122 Type 2 diabetes mellitus with diabetic chronic kidney disease: Secondary | ICD-10-CM | POA: Diagnosis present

## 2021-07-14 DIAGNOSIS — F419 Anxiety disorder, unspecified: Secondary | ICD-10-CM | POA: Diagnosis present

## 2021-07-14 DIAGNOSIS — E7800 Pure hypercholesterolemia, unspecified: Secondary | ICD-10-CM | POA: Diagnosis present

## 2021-07-14 DIAGNOSIS — Z955 Presence of coronary angioplasty implant and graft: Secondary | ICD-10-CM | POA: Diagnosis not present

## 2021-07-14 DIAGNOSIS — N179 Acute kidney failure, unspecified: Secondary | ICD-10-CM

## 2021-07-14 DIAGNOSIS — N132 Hydronephrosis with renal and ureteral calculous obstruction: Secondary | ICD-10-CM | POA: Diagnosis present

## 2021-07-14 DIAGNOSIS — F418 Other specified anxiety disorders: Secondary | ICD-10-CM | POA: Diagnosis present

## 2021-07-14 DIAGNOSIS — I959 Hypotension, unspecified: Secondary | ICD-10-CM | POA: Diagnosis present

## 2021-07-14 DIAGNOSIS — Z9861 Coronary angioplasty status: Secondary | ICD-10-CM

## 2021-07-14 DIAGNOSIS — Z8673 Personal history of transient ischemic attack (TIA), and cerebral infarction without residual deficits: Secondary | ICD-10-CM

## 2021-07-14 DIAGNOSIS — N178 Other acute kidney failure: Secondary | ICD-10-CM

## 2021-07-14 DIAGNOSIS — I10 Essential (primary) hypertension: Secondary | ICD-10-CM | POA: Diagnosis present

## 2021-07-14 DIAGNOSIS — N184 Chronic kidney disease, stage 4 (severe): Secondary | ICD-10-CM | POA: Diagnosis present

## 2021-07-14 DIAGNOSIS — N2889 Other specified disorders of kidney and ureter: Principal | ICD-10-CM

## 2021-07-14 DIAGNOSIS — F17210 Nicotine dependence, cigarettes, uncomplicated: Secondary | ICD-10-CM | POA: Diagnosis present

## 2021-07-14 DIAGNOSIS — G8929 Other chronic pain: Secondary | ICD-10-CM | POA: Diagnosis present

## 2021-07-14 DIAGNOSIS — E119 Type 2 diabetes mellitus without complications: Secondary | ICD-10-CM

## 2021-07-14 DIAGNOSIS — Z7982 Long term (current) use of aspirin: Secondary | ICD-10-CM

## 2021-07-14 DIAGNOSIS — Z87442 Personal history of urinary calculi: Secondary | ICD-10-CM

## 2021-07-14 DIAGNOSIS — Z91040 Latex allergy status: Secondary | ICD-10-CM | POA: Diagnosis not present

## 2021-07-14 DIAGNOSIS — Z885 Allergy status to narcotic agent status: Secondary | ICD-10-CM | POA: Diagnosis not present

## 2021-07-14 DIAGNOSIS — Z7902 Long term (current) use of antithrombotics/antiplatelets: Secondary | ICD-10-CM

## 2021-07-14 DIAGNOSIS — D49512 Neoplasm of unspecified behavior of left kidney: Secondary | ICD-10-CM | POA: Diagnosis not present

## 2021-07-14 DIAGNOSIS — I2510 Atherosclerotic heart disease of native coronary artery without angina pectoris: Secondary | ICD-10-CM | POA: Diagnosis present

## 2021-07-14 DIAGNOSIS — Z8249 Family history of ischemic heart disease and other diseases of the circulatory system: Secondary | ICD-10-CM

## 2021-07-14 DIAGNOSIS — Z7984 Long term (current) use of oral hypoglycemic drugs: Secondary | ICD-10-CM

## 2021-07-14 DIAGNOSIS — Z20822 Contact with and (suspected) exposure to covid-19: Secondary | ICD-10-CM | POA: Diagnosis present

## 2021-07-14 DIAGNOSIS — E1151 Type 2 diabetes mellitus with diabetic peripheral angiopathy without gangrene: Secondary | ICD-10-CM | POA: Diagnosis present

## 2021-07-14 DIAGNOSIS — Z882 Allergy status to sulfonamides status: Secondary | ICD-10-CM

## 2021-07-14 DIAGNOSIS — R109 Unspecified abdominal pain: Secondary | ICD-10-CM | POA: Diagnosis not present

## 2021-07-14 DIAGNOSIS — I129 Hypertensive chronic kidney disease with stage 1 through stage 4 chronic kidney disease, or unspecified chronic kidney disease: Secondary | ICD-10-CM | POA: Diagnosis present

## 2021-07-14 DIAGNOSIS — E1165 Type 2 diabetes mellitus with hyperglycemia: Secondary | ICD-10-CM | POA: Diagnosis present

## 2021-07-14 DIAGNOSIS — N1832 Chronic kidney disease, stage 3b: Secondary | ICD-10-CM | POA: Diagnosis present

## 2021-07-14 DIAGNOSIS — F32A Depression, unspecified: Secondary | ICD-10-CM | POA: Diagnosis present

## 2021-07-14 DIAGNOSIS — N131 Hydronephrosis with ureteral stricture, not elsewhere classified: Secondary | ICD-10-CM | POA: Diagnosis not present

## 2021-07-14 LAB — CBC WITH DIFFERENTIAL/PLATELET
Abs Immature Granulocytes: 0.09 10*3/uL — ABNORMAL HIGH (ref 0.00–0.07)
Basophils Absolute: 0.1 10*3/uL (ref 0.0–0.1)
Basophils Relative: 1 %
Eosinophils Absolute: 0.3 10*3/uL (ref 0.0–0.5)
Eosinophils Relative: 2 %
HCT: 36.1 % (ref 36.0–46.0)
Hemoglobin: 11.4 g/dL — ABNORMAL LOW (ref 12.0–15.0)
Immature Granulocytes: 1 %
Lymphocytes Relative: 13 %
Lymphs Abs: 1.7 10*3/uL (ref 0.7–4.0)
MCH: 29.5 pg (ref 26.0–34.0)
MCHC: 31.6 g/dL (ref 30.0–36.0)
MCV: 93.3 fL (ref 80.0–100.0)
Monocytes Absolute: 1.2 10*3/uL — ABNORMAL HIGH (ref 0.1–1.0)
Monocytes Relative: 9 %
Neutro Abs: 9.6 10*3/uL — ABNORMAL HIGH (ref 1.7–7.7)
Neutrophils Relative %: 74 %
Platelets: 263 10*3/uL (ref 150–400)
RBC: 3.87 MIL/uL (ref 3.87–5.11)
RDW: 14.4 % (ref 11.5–15.5)
WBC: 13 10*3/uL — ABNORMAL HIGH (ref 4.0–10.5)
nRBC: 0 % (ref 0.0–0.2)

## 2021-07-14 LAB — COMPREHENSIVE METABOLIC PANEL
ALT: 25 U/L (ref 0–44)
AST: 25 U/L (ref 15–41)
Albumin: 3.6 g/dL (ref 3.5–5.0)
Alkaline Phosphatase: 87 U/L (ref 38–126)
Anion gap: 12 (ref 5–15)
BUN: 79 mg/dL — ABNORMAL HIGH (ref 8–23)
CO2: 22 mmol/L (ref 22–32)
Calcium: 8.8 mg/dL — ABNORMAL LOW (ref 8.9–10.3)
Chloride: 102 mmol/L (ref 98–111)
Creatinine, Ser: 3.61 mg/dL — ABNORMAL HIGH (ref 0.44–1.00)
GFR, Estimated: 13 mL/min — ABNORMAL LOW (ref >60)
Glucose, Bld: 202 mg/dL — ABNORMAL HIGH (ref 70–99)
Potassium: 5.1 mmol/L (ref 3.5–5.1)
Sodium: 136 mmol/L (ref 135–145)
Total Bilirubin: 0.4 mg/dL (ref 0.3–1.2)
Total Protein: 7.1 g/dL (ref 6.5–8.1)

## 2021-07-14 LAB — RESP PANEL BY RT-PCR (FLU A&B, COVID) ARPGX2
Influenza A by PCR: NEGATIVE
Influenza B by PCR: NEGATIVE
SARS Coronavirus 2 by RT PCR: NEGATIVE

## 2021-07-14 LAB — MAGNESIUM: Magnesium: 2 mg/dL (ref 1.7–2.4)

## 2021-07-14 LAB — GLUCOSE, CAPILLARY
Glucose-Capillary: 98 mg/dL (ref 70–99)
Glucose-Capillary: 148 mg/dL — ABNORMAL HIGH (ref 70–99)

## 2021-07-14 LAB — PHOSPHORUS: Phosphorus: 5.9 mg/dL — ABNORMAL HIGH (ref 2.5–4.6)

## 2021-07-14 LAB — HIV ANTIBODY (ROUTINE TESTING W REFLEX): HIV Screen 4th Generation wRfx: NONREACTIVE

## 2021-07-14 MED ORDER — INSULIN ASPART 100 UNIT/ML IJ SOLN: 0.0000 [IU] | Freq: Every day | INTRAMUSCULAR | Status: DC

## 2021-07-14 MED ORDER — NICOTINE 21 MG/24HR TD PT24
21.0000 mg | MEDICATED_PATCH | Freq: Once | TRANSDERMAL | Status: AC
Start: 2021-07-14 — End: 2021-07-15
  Administered 2021-07-14: 21 mg via TRANSDERMAL
  Filled 2021-07-14: qty 1

## 2021-07-14 MED ORDER — INSULIN GLARGINE-YFGN 100 UNIT/ML ~~LOC~~ SOLN
5.0000 [IU] | Freq: Every day | SUBCUTANEOUS | Status: DC
  Administered 2021-07-14 – 2021-07-16 (×3): 5 [IU] via SUBCUTANEOUS
  Filled 2021-07-14 (×4): qty 0.05

## 2021-07-14 MED ORDER — FENTANYL CITRATE PF 50 MCG/ML IJ SOSY
50.0000 ug | PREFILLED_SYRINGE | INTRAMUSCULAR | Status: DC | PRN
  Administered 2021-07-14 – 2021-07-17 (×7): 50 ug via INTRAVENOUS
  Filled 2021-07-14 (×7): qty 1

## 2021-07-14 MED ORDER — SODIUM CHLORIDE 0.9 % IV SOLN: INTRAVENOUS | Status: DC

## 2021-07-14 MED ORDER — ACETAMINOPHEN 325 MG PO TABS: 650.0000 mg | ORAL_TABLET | Freq: Four times a day (QID) | ORAL | Status: DC | PRN

## 2021-07-14 MED ORDER — ONDANSETRON HCL 4 MG/2ML IJ SOLN
4.0000 mg | Freq: Four times a day (QID) | INTRAMUSCULAR | Status: DC | PRN
Start: 2021-07-14 — End: 2021-07-17
  Administered 2021-07-14: 4 mg via INTRAVENOUS
  Filled 2021-07-14: qty 2

## 2021-07-14 MED ORDER — ONDANSETRON HCL 4 MG PO TABS: 4.0000 mg | ORAL_TABLET | Freq: Four times a day (QID) | ORAL | Status: DC | PRN

## 2021-07-14 MED ORDER — ACETAMINOPHEN 650 MG RE SUPP: 650.0000 mg | Freq: Four times a day (QID) | RECTAL | Status: DC | PRN

## 2021-07-14 MED ORDER — GABAPENTIN 300 MG PO CAPS
300.0000 mg | ORAL_CAPSULE | Freq: Every day | ORAL | Status: DC
  Administered 2021-07-14 – 2021-07-16 (×3): 300 mg via ORAL
  Filled 2021-07-14 (×3): qty 1

## 2021-07-14 MED ORDER — INSULIN ASPART 100 UNIT/ML IJ SOLN
0.0000 [IU] | Freq: Three times a day (TID) | INTRAMUSCULAR | Status: DC
  Administered 2021-07-16: 2 [IU] via SUBCUTANEOUS
  Administered 2021-07-17: 1 [IU] via SUBCUTANEOUS

## 2021-07-14 MED ORDER — ROSUVASTATIN CALCIUM 20 MG PO TABS
20.0000 mg | ORAL_TABLET | Freq: Every day | ORAL | Status: DC
  Administered 2021-07-15 – 2021-07-17 (×3): 20 mg via ORAL
  Filled 2021-07-14 (×3): qty 1

## 2021-07-14 MED ORDER — ASPIRIN EC 81 MG PO TBEC
81.0000 mg | DELAYED_RELEASE_TABLET | Freq: Every day | ORAL | Status: DC
Start: 2021-07-14 — End: 2021-07-17
  Administered 2021-07-17: 81 mg via ORAL
  Filled 2021-07-14 (×3): qty 1

## 2021-07-14 MED ORDER — ALPRAZOLAM 0.5 MG PO TABS
0.5000 mg | ORAL_TABLET | Freq: Two times a day (BID) | ORAL | Status: DC | PRN
  Administered 2021-07-17: 0.5 mg via ORAL
  Filled 2021-07-14: qty 1

## 2021-07-14 MED ORDER — ESCITALOPRAM OXALATE 10 MG PO TABS
20.0000 mg | ORAL_TABLET | Freq: Every day | ORAL | Status: DC
  Administered 2021-07-15 – 2021-07-17 (×3): 20 mg via ORAL
  Filled 2021-07-14 (×3): qty 2

## 2021-07-14 MED ORDER — ALBUTEROL SULFATE (2.5 MG/3ML) 0.083% IN NEBU: 2.5000 mg | INHALATION_SOLUTION | RESPIRATORY_TRACT | Status: DC | PRN

## 2021-07-14 MED ORDER — METOPROLOL TARTRATE 25 MG PO TABS
12.5000 mg | ORAL_TABLET | Freq: Every day | ORAL | Status: DC
  Administered 2021-07-15 – 2021-07-17 (×3): 12.5 mg via ORAL
  Filled 2021-07-14 (×3): qty 1

## 2021-07-14 MED ORDER — PANTOPRAZOLE SODIUM 40 MG PO TBEC
40.0000 mg | DELAYED_RELEASE_TABLET | Freq: Every day | ORAL | Status: DC
  Administered 2021-07-15 – 2021-07-17 (×3): 40 mg via ORAL
  Filled 2021-07-14 (×3): qty 1

## 2021-07-14 MED ORDER — HEPARIN SODIUM (PORCINE) 5000 UNIT/ML IJ SOLN
5000.0000 [IU] | Freq: Three times a day (TID) | INTRAMUSCULAR | Status: DC
  Administered 2021-07-14 – 2021-07-17 (×7): 5000 [IU] via SUBCUTANEOUS
  Filled 2021-07-14 (×7): qty 1

## 2021-07-14 NOTE — Consult Note (Signed)
Urology Consult  ? ?Physician requesting consult: Milton Ferguson, MD ? ?Reason for consult: Left renal mass, Left hydronephrosis ? ?History of Present Illness: Grace Hess is a 66 y.o. seen in consultation from Dr. Roderic Palau for evaluation of a left renal mass and left hydronephrosis.  She presented to the emergency room yesterday with complaints of left-sided flank pain with radiation to the left groin.  No dysuria or gross hematuria.  No nausea or vomiting.  She takes oxycodone for chronic pain and has used this with some relief.  She does have a prior history of kidney stones.  CT imaging from 07/13/2021 showed a 5.4 x 3.7 x 4.4 lobulated mildly hyperdense soft tissue mass within the anterior medial aspect of the mid to lower left kidney with mass effect on the left renal pelvis and proximal left ureter with moderate to marked left hydronephrosis, numerous bilateral nonobstructing renal calculi, cortical thinning of the right kidney. ?Urinalysis: 0-5 RBCs, 0-5 WBCs, rare bacteria ?Creatinine increased from baseline of 1.3 to 2.85. ?The patient apparently left the emergency room AMA and returned today. ?Her pain has been fairly well controlled. ? ? ?Past Medical History:  ?Diagnosis Date  ? Anxiety   ? Arthritis   ? "might have a touch in my fingers" (08/02/2016)  ? CAD (coronary artery disease)   ? a. s/p DES to RCA in 07/2016 with residual mid-LAD stenosis  ? Cervicalgia   ? Chest pain, unspecified   ? GERD (gastroesophageal reflux disease)   ? Headache   ? "bad ones after my stroke in 2016; only have them when I get bad news now" (08/02/2016)  ? Heart murmur   ? Heavy smoker (more than 20 cigarettes per day)   ? Scheduled for LDCT screening 02/11/15  ? Hepatitis A   ? "related to Janine Limbo"  ? History of hiatal hernia   ? History of kidney stones   ? "no cysto/OR" (12/10/2014)  ? Hypercholesteremia   ? Hypertension   ? Obesity   ? Reflux esophagitis   ? Stroke [facility])   ? "they said I had a silent stroke a few months  back/MRI" (12/10/2014)  ? TIA (transient ischemic attack) 11/28/2014  ? [name] 11/28/2014  ? Type II diabetes mellitus ([place])   ? ? ?Past Surgical History:  ?Procedure Laterality Date  ? ABDOMINAL AORTOGRAM N/A 08/02/2016  ? Procedure: Abdominal Aortogram;  Surgeon: Nelva Bush, MD;  Location: [place] [facility];  Service: Cardiovascular;  Laterality: N/A;  ? CAROTID STENT    ? CHOLECYSTECTOMY OPEN  1978  ? COLONOSCOPY  2010  ? single diverticulum in sigmoid colon  ? CORONARY ANGIOPLASTY WITH STENT PLACEMENT  08/02/2016  ? to RCA  ? CORONARY STENT INTERVENTION N/A 08/02/2016  ? Procedure: Coronary Stent Intervention;  Surgeon: Nelva Bush, MD;  Location: [place] [facility];  Service: Cardiovascular;  Laterality: N/A;  RCA  ? ENDARTERECTOMY Left 12/02/2014  ? Procedure: ENDARTERECTOMY CAROTID;  Surgeon: Rosetta Posner, MD;  Location: [place];  Service: Vascular;  Laterality: Left;  ? LEFT HEART CATH AND CORONARY ANGIOGRAPHY N/A 08/02/2016  ? Procedure: Left Heart Cath and Coronary Angiography;  Surgeon: Nelva Bush, MD;  Location: [place] [facility];  Service: Cardiovascular;  Laterality: N/A;  ? LOWER EXTREMITY ANGIOGRAM  08/02/2016  ? LOWER EXTREMITY ANGIOGRAPHY N/A 08/02/2016  ? Procedure: Lower Extremity Angiography;  Surgeon: Nelva Bush, MD;  Location: [place] [facility];  Service: Cardiovascular;  Laterality: N/A;  ? VAGINAL HYSTERECTOMY  1991  ? "partial"  ? ? ?Medications: ?Scheduled Meds: ?Continuous Infusions: ?PRN Meds:. ? ?Allergies:  ?Allergies  ?Allergen Reactions  ? Codeine Shortness Of Breath  ? Latex Itching  ? Sulfa Antibiotics Other (See Comments)  ?  Reaction:  Unknown   ? ? ?Family History  ?Problem Relation Age of Onset  ? Congestive Heart Failure Mother   ? COPD Father   ? Cancer Brother   ? Cancer Brother   ? ? ?Social History:  reports that she has been smoking cigarettes. She started smoking about 47 years ago. She has a 64.50 pack-year smoking history. She has never used  smokeless tobacco. She reports that she does not drink alcohol and does not use drugs. ? ?ROS: ?A complete review of systems was performed.  All systems are negative except for pertinent findings as noted. ? ?Physical Exam:  ?Vital signs in last 24 hours: ?Temp:  [97.6 ?F (36.4 ?C)] 97.6 ?F (36.4 ?C) (05/02 1028) ?Pulse Rate:  [62-64] 64 (05/02 1028) ?Resp:  [16-18] 18 (05/02 1028) ?BP: (90-122)/(50-54) 90/54 (05/02 1028) ?SpO2:  [94 %] 94 % (05/02 1028) ?Weight:  [66.7 kg] 66.7 kg (05/02 1030) ?GENERAL APPEARANCE:  Well appearing, well developed, well nourished, NAD ?HEENT: Atraumatic, Normocephalic, oropharynx clear. ?NECK: Supple without lymphadenopathy or thyromegaly. ?LUNGS: Clear to auscultation bilaterally. ?HEART: Regular Rate and Rhythm without murmurs, gallops, or rubs. ?ABDOMEN: Soft, non-tender, No Masses. ?EXTREMITIES: Moves all extremities well.  Without clubbing, cyanosis, or edema. ?NEUROLOGIC:  Alert and oriented x 3, normal gait, CN II-XII grossly intact.  ?MENTAL STATUS:  Appropriate. ?BACK:  Non-tender to palpation.  Mild left CVA tenderness. ?SKIN:  Warm, dry and intact.   ? ?Laboratory Data:  ?Recent Labs  ?  07/13/21 ?2725 07/14/21 ?1048  ?WBC 13.7* 13.0*  ?HGB 11.8* 11.4*  ?HCT 36.8 36.1  ?PLT 250 263  ? ? ?Recent Labs  ?  07/13/21 ?1659 07/14/21 ?1048  ?NA 136 136  ?K 4.5 5.1  ?CL 103 102  ?GLUCOSE 228* 202*  ?BUN 62* 79*  ?CALCIUM 9.0 8.8*  ?CREATININE 2.85* 3.61*  ? ? ? ?Results for orders placed or performed during the hospital encounter of 07/14/21 (from the past 24 hour(s))  ?CBC with Differential     Status: Abnormal  ? Collection Time: 07/14/21 10:48 AM  ?Result Value Ref Range  ? WBC 13.0 (H) 4.0 - 10.5 K/uL  ? RBC 3.87 3.87 - 5.11 MIL/uL  ? Hemoglobin 11.4 (L) 12.0 - 15.0 g/dL  ? HCT 36.1 36.0 - 46.0 %  ? MCV 93.3 80.0 - 100.0 fL  ? MCH 29.5 26.0 - 34.0 pg  ? MCHC 31.6 30.0 - 36.0 g/dL  ? RDW 14.4 11.5 - 15.5 %  ? Platelets 263 150 - 400 K/uL  ? nRBC 0.0 0.0 - 0.2 %  ? Neutrophils  Relative % 74 %  ? Neutro Abs 9.6 (H) 1.7 - 7.7 K/uL  ? Lymphocytes Relative 13 %  ? Lymphs Abs 1.7 0.7 - 4.0 K/uL  ? Monocytes Relative 9 %  ? Monocytes Absolute 1.2 (H) 0.1 - 1.0 K/uL  ? Eosinophils Relative 2 %  ? Eosinophils Absolute 0.3 0.0 - 0.5 K/uL  ? Basophils Relative 1 %  ? Basophils Absolute 0.1 0.0 - 0.1 K/uL  ? Immature Granulocytes 1 %  ? Abs Immature Granulocytes 0.09 (H) 0.00 - 0.07 K/uL  ?Comprehensive metabolic panel     Status: Abnormal  ? Collection Time: 07/14/21 10:48 AM  ?Result Value Ref  Range  ? Sodium 136 135 - 145 mmol/L  ? Potassium 5.1 3.5 - 5.1 mmol/L  ? Chloride 102 98 - 111 mmol/L  ? CO2 22 22 - 32 mmol/L  ? Glucose, Bld 202 (H) 70 - 99 mg/dL  ? BUN 79 (H) 8 - 23 mg/dL  ? Creatinine, Ser 3.61 (H) 0.44 - 1.00 mg/dL  ? Calcium 8.8 (L) 8.9 - 10.3 mg/dL  ? Total Protein 7.1 6.5 - 8.1 g/dL  ? Albumin 3.6 3.5 - 5.0 g/dL  ? AST 25 15 - 41 U/L  ? ALT 25 0 - 44 U/L  ? Alkaline Phosphatase 87 38 - 126 U/L  ? Total Bilirubin 0.4 0.3 - 1.2 mg/dL  ? GFR, Estimated 13 (L) >60 mL/min  ? Anion gap 12 5 - 15  ? ?No results found for this or any previous visit (from the past 240 hour(s)). ? ?Renal Function: ?Recent Labs  ?  07/13/21 ?1659 07/14/21 ?1048  ?CREATININE 2.85* 3.61*  ? ?Estimated Creatinine Clearance: 14.4 mL/min (A) (by C-G formula based on SCr of 3.61 mg/dL (H)). ? ?Radiologic Imaging: ?CT Renal Stone Study ? ?Result Date: 07/13/2021 ?CLINICAL DATA:  Left-sided abdominal pain and groin pain with nausea x3 days. EXAM: CT ABDOMEN AND PELVIS WITHOUT CONTRAST TECHNIQUE: Multidetector CT imaging of the abdomen and pelvis was performed following the standard protocol without IV contrast. RADIATION DOSE REDUCTION: This exam was performed according to the departmental dose-optimization program which includes automated exposure control, adjustment of the mA and/or kV according to patient size and/or use of iterative reconstruction technique. COMPARISON:  April 01, 2021 and October 22, 2013  FINDINGS: Lower chest: No acute abnormality. Hepatobiliary: A punctate calcified granuloma is seen within the anteromedial aspect of the right lobe of the liver. Status post cholecystectomy. No biliary dilatation.

## 2021-07-14 NOTE — ED Triage Notes (Signed)
Patient with complaints of left side pain. States she was here the previous day and was to be admitted due to a mass that was found but was unable to stay yesterday and she would have to come back today to be admitted.  ?

## 2021-07-14 NOTE — Assessment & Plan Note (Signed)
-  No significant residual deficit. ?-Continue risk factor modifications. ?-Holding the use of Plavix in anticipation to surgical intervention ?-Continue aspirin. ? ?

## 2021-07-14 NOTE — H&P (Signed)
?History and Physical  ? ? ?Patient: Grace Hess:[id number] DOB: [birth year] ?DOA: 07/14/2021 ?DOS: the patient was seen and examined on 07/14/2021 ?PCP: Redmond School, MD  ?Patient coming from: Home ? ?Chief Complaint:  ?Chief Complaint  ?Patient presents with  ? Flank Pain  ? ?HPI: Grace Hess is a 66 y.o. 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  Patient discomfort has been present for the last 3-4 days.  No aggravating or relieving factors.  Patient expresses no associated symptoms.  She denies chest pain, hematuria, nausea, vomiting, fever, chills, shortness of breath or focal weakness. ?Of note, patient was seen on 07/13/2021 with similar presentation and at that time and work-up demonstrated worsening renal function with left kidney mass causing obstruction and hydronephrosis (very high concern for renal cell carcinoma based on images findings).  At that time case was discussed with patient regarding need for admission and urology involvement but she decided to leave Grace Hess as she has something to do.  Patient returns today with intention to be admitted to the hospital for further evaluation and management. ? ?CBC demonstrating WBCs of 13.0, hemoglobin 11.4, platelet count 263K.  As mentioned above images with high concerns for renal cell carcinoma causing mass effect obstruction and moderate hydronephrosis on her left kidney.  Basic metabolic panel with a sodium of 136, potassium 5.1 BUN 79 and a creatinine of 3.61 with a GFR of 13 (previous creatinine levels at baseline 1.5--1.7) ? ?Urology service was consulted with plans to  place stenting for decompression and improvement in renal function.  TRH called to place in the hospital and to assist with further evaluation and management. ? ?Review of Systems: As mentioned in the history of present illness. All other systems reviewed and are negative. ?Past Medical History:  ?Diagnosis Date  ? Anxiety   ? Arthritis   ? "might have a touch in my fingers" (08/02/2016)  ? CAD (coronary artery disease)   ? a. s/p DES to RCA in 07/2016 with residual mid-LAD stenosis  ? Cervicalgia   ? Chest pain, unspecified   ? GERD (gastroesophageal reflux disease)   ? Headache   ? "bad ones after my stroke in 2016; only have them when I get bad news now" (08/02/2016)  ? Heart murmur   ? Heavy smoker (more than 20 cigarettes per day)   ? Scheduled for LDCT screening 02/11/15  ? Hepatitis A   ? "related to Janine Limbo"  ? History of hiatal hernia   ? History of kidney stones   ? "no cysto/OR" (12/10/2014)  ? Hypercholesteremia   ? Hypertension   ? Obesity   ? Reflux esophagitis   ? Stroke [facility])   ? "they said I had a silent stroke a few months back/MRI" (12/10/2014)  ? TIA (transient ischemic attack) 11/28/2014  ? [name] 11/28/2014  ? Type II diabetes mellitus ([place])   ? ?Past Surgical History:  ?Procedure Laterality Date  ? ABDOMINAL AORTOGRAM N/A 08/02/2016  ? Procedure: Abdominal Aortogram;  Surgeon: Nelva Bush, MD;  Location: [place] [facility];  Service: Cardiovascular;  Laterality: N/A;  ? CAROTID STENT    ? CHOLECYSTECTOMY OPEN  1978  ? COLONOSCOPY  2010  ?  single diverticulum in sigmoid colon  ? CORONARY ANGIOPLASTY WITH STENT PLACEMENT  08/02/2016  ? to RCA  ? CORONARY STENT INTERVENTION N/A 08/02/2016  ? Procedure: Coronary Stent Intervention;  Surgeon: Nelva Bush, MD;  Location: [place] [facility];  Service: Cardiovascular;  Laterality: N/A;  RCA  ? ENDARTERECTOMY Left 12/02/2014  ? Procedure: ENDARTERECTOMY CAROTID;  Surgeon: Rosetta Posner, MD;  Location: [place];  Service: Vascular;  Laterality: Left;   ? LEFT HEART CATH AND CORONARY ANGIOGRAPHY N/A 08/02/2016  ? Procedure: Left Heart Cath and Coronary Angiography;  Surgeon: Nelva Bush, MD;  Location: [place] [facility];  Service: Cardiovascular;  Laterality: N/A;  ? LOWER EXTREMITY ANGIOGRAM  08/02/2016  ? LOWER EXTREMITY ANGIOGRAPHY N/A 08/02/2016  ? Procedure: Lower Extremity Angiography;  Surgeon: Nelva Bush, MD;  Location: [place] [facility];  Service: Cardiovascular;  Laterality: N/A;  ? VAGINAL HYSTERECTOMY  1991  ? "partial"  ? ?Social History:  reports that she has been smoking cigarettes. She started smoking about 47 years ago. She has a 64.50 pack-year smoking history. She has never used smokeless tobacco. She reports that she does not drink alcohol and does not use drugs. ? ?Allergies  ?Allergen Reactions  ? Codeine Shortness Of Breath  ? Latex Itching  ? Sulfa Antibiotics Other (See Comments)  ?  Reaction:  Unknown   ? ? ?Family History  ?Problem Relation Age of Onset  ? Congestive Heart Failure Mother   ? COPD Father   ? Cancer Brother   ? Cancer Brother   ? ? ?Prior to Admission medications   ?Medication Sig Start Date End Date Taking? Authorizing Provider  ?acetaminophen (TYLENOL) 325 MG tablet Take 2 tablets (650 mg total) by mouth every 4 (four) hours as needed for headache or mild pain. 08/03/16  Yes Kilroy, Luke K, PA-C  ?albuterol (VENTOLIN HFA) 108 (90 Base) MCG/ACT inhaler Inhale 1-2 puffs into the lungs every 4 (four) hours as needed for shortness of breath or wheezing.   Yes [provider]  ?ALPRAZolam (XANAX) 0.5 MG tablet Take 0.5 mg by mouth 2 (two) times daily as needed. 03/20/19  Yes [provider]  ?aspirin 81 MG EC tablet Take by mouth. 12/03/14  Yes [provider]  ?benzonatate (TESSALON) 100 MG capsule Take 100 mg by mouth 4 (four) times daily as needed. 03/03/21  Yes [provider]  ?Biotin 5000 MCG CAPS Take 1 capsule by mouth daily.   Yes [provider]  ?Cholecalciferol  (VITAMIN D3) 125 MCG (5000 UT) CAPS Take 1 capsule by mouth once a week. Tuesday   Yes [provider]  ?clopidogrel (PLAVIX) 75 MG tablet Take 1 tablet (75 mg total) by mouth daily. 12/03/14  Yes Rai, Ripudeep K, MD  ?escitalopram (LEXAPRO) 20 MG tablet Take 20 mg by mouth daily. 01/01/21  Yes [provider]  ?gabapentin (NEURONTIN) 300 MG capsule Take 300 mg by mouth at bedtime.   Yes [provider]  ?glipiZIDE (GLUCOTROL XL) 2.5 MG 24 hr tablet TAKE 1 TABLET(2.5 MG) BY MOUTH DAILY WITH BREAKFAST ?Patient taking differently: Take 2.5 mg by mouth daily with breakfast. 04/22/21  Yes Reardon, Loree Fee J, NP  ?lisinopril (ZESTRIL) 20 MG tablet TAKE 1 TABLET(20 MG) BY MOUTH DAILY ?Patient taking differently: Take 20 mg by mouth daily. 02/23/21  Yes BranchAlphonse Guild, MD  ?metFORMIN (GLUCOPHAGE) 500 MG tablet Take 500 mg by mouth 2 (two) times daily with a meal. 04/05/17  Yes [provider]  ?  metoprolol tartrate (LOPRESSOR) 25 MG tablet Take 1 tablet (25 mg total) by mouth 2 (two) times daily. 03/19/21  Yes Strader, [place] M, PA-C  ?nitroGLYCERIN (NITROSTAT) 0.4 MG SL tablet Place 1 tablet (0.4 mg total) under the tongue every 5 (five) minutes as needed for chest pain. 10/25/19  Yes BranchAlphonse Guild, MD  ?nystatin cream (MYCOSTATIN) Apply 1 application topically 2 (two) times daily as needed. 07/10/16  Yes [provider]  ?ondansetron (ZOFRAN) 4 MG tablet TAKE 1 TABLET(4 MG) BY MOUTH TWICE DAILY AS NEEDED FOR NAUSEA OR VOMITING 04/06/21  Yes Montez Morita, Daniel, MD  ?OZEMPIC, 0.25 OR 0.5 MG/DOSE, 2 MG/1.5ML SOPN INJECT 0.5MG INTO THE SKIN ONCE WEEKLY ?Patient taking differently: Inject 0.5 mg into the skin once a week. 07/08/21  Yes Brita Romp, NP  ?pantoprazole (PROTONIX) 40 MG tablet Take 1 tablet (40 mg total) by mouth daily before breakfast. 01/13/21  Yes Carlan, Deatra Robinson, NP  ?rosuvastatin (CRESTOR) 20 MG tablet TAKE 1 TABLET(20 MG) BY MOUTH DAILY ?Patient  taking differently: Take 20 mg by mouth daily. 02/23/21  Yes Arnoldo Lenis, MD  ?Blood Glucose Monitoring Suppl (ACCU-CHEK GUIDE) w/Device KIT 1 Piece by Does not apply route as directed. 08/23/19   Ni

## 2021-07-14 NOTE — Assessment & Plan Note (Signed)
-   Holding oral hypoglycemic agents ?-Patient has been started on Semglee and a sliding scale insulin. ?-Follow CBGs fluctuation and adjust hypoglycemic agents as needed. ?

## 2021-07-14 NOTE — Assessment & Plan Note (Signed)
-   Stable mood currently ?-No suicidal ideation or hallucination  ?-resume home anxiolytics and depressant agents. ?

## 2021-07-14 NOTE — Assessment & Plan Note (Signed)
Continue PPI ?

## 2021-07-14 NOTE — ED Provider Notes (Signed)
?[place] ?Provider Note ? ? ?CSN: [id number] ?Arrival date & time: 07/14/21  1012 ? ?  ? ?History ? ?Chief Complaint  ?Patient presents with  ? Flank Pain  ? ? ?Grace Hess is a 66 y.o. female with a history of type 2 diabetes, hypertension who was seen here yesterday and diagnosed with a left renal mass, significant left hydronephrosis and an elevated creatinine above her baseline suggesting acute renal failure, but refused admission yesterday stating she had to take care of personal concerns before she could be admitted.  However, she does return today and is ready to be admitted for further evaluation of this mass.  She was fairly comfortable overnight last night but since arrival here has started to develop some left flank pain.  She denies fevers or chills or any other complaints at this time. ? ?The history is provided by the patient and the spouse.  ? ?  ? ?Home Medications ?Prior to Admission medications   ?Medication Sig Start Date End Date Taking? Authorizing Provider  ?acetaminophen (TYLENOL) 325 MG tablet Take 2 tablets (650 mg total) by mouth every 4 (four) hours as needed for headache or mild pain. 08/03/16   Erlene Quan, PA-C  ?albuterol (VENTOLIN HFA) 108 (90 Base) MCG/ACT inhaler albuterol sulfate HFA 90 mcg/actuation aerosol inhaler ? INHALE ONE TO 2 PUFFS BY INHALATION EVERY FOUR HOURS AS NEEDED    [provider]  ?ALPRAZolam (XANAX) 0.5 MG tablet Take 0.5 mg by mouth 2 (two) times daily as needed. 03/20/19   [provider]  ?benzonatate (TESSALON) 100 MG capsule Take 100 mg by mouth 4 (four) times daily as needed. 03/03/21   [provider]  ?Blood Glucose Monitoring Suppl (ACCU-CHEK GUIDE) w/Device KIT 1 Piece by Does not apply route as directed. 08/23/19   Cassandria Anger, MD  ?Cholecalciferol (VITAMIN D3) 125 MCG (5000 UT) CAPS Take by mouth daily.    [provider]  ?clopidogrel (PLAVIX) 75 MG tablet Take 1 tablet (75 mg  total) by mouth daily. 12/03/14   Rai, Vernelle Emerald, MD  ?dicyclomine (BENTYL) 10 MG capsule Take 1 capsule (10 mg total) by mouth 2 (two) times daily. 03/28/17   Rehman, Mechele Dawley, MD  ?escitalopram (LEXAPRO) 20 MG tablet Take 20 mg by mouth daily. 01/01/21   [provider]  ?gabapentin (NEURONTIN) 300 MG capsule Take 300 mg by mouth at bedtime.    [provider]  ?glipiZIDE (GLUCOTROL XL) 2.5 MG 24 hr tablet TAKE 1 TABLET(2.5 MG) BY MOUTH DAILY WITH BREAKFAST 04/22/21   Brita Romp, NP  ?glucose blood (ACCU-CHEK GUIDE) test strip Use as instructed 08/23/19   Cassandria Anger, MD  ?LEXAPRO 10 MG tablet Take 20 mg by mouth daily.  04/18/19   [provider]  ?lisinopril (ZESTRIL) 20 MG tablet TAKE 1 TABLET(20 MG) BY MOUTH DAILY 02/23/21   Arnoldo Lenis, MD  ?metoprolol tartrate (LOPRESSOR) 25 MG tablet Take 1 tablet (25 mg total) by mouth 2 (two) times daily. 03/19/21   Strader, Fransisco Hertz, PA-C  ?metroNIDAZOLE (FLAGYL) 500 MG tablet Take 1 tablet (500 mg total) by mouth 2 (two) times daily. One po bid x 7 days 04/01/21   Etta Quill, NP  ?nitroGLYCERIN (NITROSTAT) 0.4 MG SL tablet Place 1 tablet (0.4 mg total) under the tongue every 5 (five) minutes as needed for chest pain. 10/25/19   Arnoldo Lenis, MD  ?nystatin cream (MYCOSTATIN) Apply 1 application topically 2 (two)  times daily as needed. 07/10/16   [provider]  ?ondansetron (ZOFRAN) 4 MG tablet TAKE 1 TABLET(4 MG) BY MOUTH TWICE DAILY AS NEEDED FOR NAUSEA OR VOMITING 04/06/21   Montez Morita, Quillian Quince, MD  ?oxyCODONE-acetaminophen (PERCOCET/ROXICET) 5-325 MG tablet Take 1 tablet by mouth 3 (three) times daily as needed. 08/20/19   [provider]  ?OZEMPIC, 0.25 OR 0.5 MG/DOSE, 2 MG/1.5ML SOPN INJECT 0.5MG INTO THE SKIN ONCE WEEKLY 07/08/21   Brita Romp, NP  ?pantoprazole (PROTONIX) 40 MG tablet Take 1 tablet (40 mg total) by mouth daily before breakfast. 01/13/21   Gabriel Rung, NP   ?rosuvastatin (CRESTOR) 20 MG tablet TAKE 1 TABLET(20 MG) BY MOUTH DAILY 02/23/21   Arnoldo Lenis, MD  ?   ? ?Allergies    ?Codeine, Latex, and Sulfa antibiotics   ? ?Review of Systems   ?Review of Systems  ?Constitutional:  Negative for chills and fever.  ?HENT:  Negative for congestion and sore throat.   ?Eyes: Negative.   ?Respiratory:  Negative for chest tightness and shortness of breath.   ?Cardiovascular:  Negative for chest pain.  ?Gastrointestinal:  Negative for abdominal pain, nausea and vomiting.  ?Genitourinary:  Positive for flank pain.  ?Musculoskeletal:  Negative for arthralgias, joint swelling and neck pain.  ?Skin: Negative.  Negative for rash and wound.  ?Neurological:  Negative for dizziness, weakness, light-headedness, numbness and headaches.  ?Psychiatric/Behavioral: Negative.    ? ?Physical Exam ?Updated Vital Signs ?BP (!) 90/54 (BP Location: Right Arm)   Pulse 64   Temp 97.6 ?F (36.4 ?C) (Oral)   Resp 18   Ht '5\' 6"'  (1.676 m)   Wt 66.7 kg   SpO2 94%   BMI 23.73 kg/m?  ?Physical Exam ?Vitals and nursing note reviewed.  ?Constitutional:   ?   Appearance: She is well-developed.  ?HENT:  ?   Head: Normocephalic and atraumatic.  ?Eyes:  ?   Conjunctiva/sclera: Conjunctivae normal.  ?Cardiovascular:  ?   Rate and Rhythm: Normal rate and regular rhythm.  ?   Heart sounds: Normal heart sounds.  ?Pulmonary:  ?   Effort: Pulmonary effort is normal.  ?   Breath sounds: Normal breath sounds. No wheezing.  ?Abdominal:  ?   General: Bowel sounds are normal.  ?   Palpations: Abdomen is soft.  ?   Tenderness: There is no abdominal tenderness. There is left CVA tenderness. There is no guarding.  ?Musculoskeletal:     ?   General: Normal range of motion.  ?   Cervical back: Normal range of motion.  ?Skin: ?   General: Skin is warm and dry.  ?Neurological:  ?   General: No focal deficit present.  ?   Mental Status: She is alert.  ? ? ?ED Results / Procedures / Treatments   ?Labs ?(all labs ordered  are listed, but only abnormal results are displayed) ?Labs Reviewed  ?CBC WITH DIFFERENTIAL/PLATELET - Abnormal; Notable for the following components:  ?    Result Value  ? WBC 13.0 (*)   ? Hemoglobin 11.4 (*)   ? Neutro Abs 9.6 (*)   ? Monocytes Absolute 1.2 (*)   ? Abs Immature Granulocytes 0.09 (*)   ? All other components within normal limits  ?COMPREHENSIVE METABOLIC PANEL - Abnormal; Notable for the following components:  ? Glucose, Bld 202 (*)   ? BUN 79 (*)   ? Creatinine, Ser 3.61 (*)   ? Calcium 8.8 (*)   ? GFR,  Estimated 13 (*)   ? All other components within normal limits  ?RESP PANEL BY RT-PCR (FLU A&B, COVID) ARPGX2  ?HIV ANTIBODY (ROUTINE TESTING W REFLEX)  ?MAGNESIUM  ?PHOSPHORUS  ? ? ?EKG ?None ? ?Radiology ?CT Renal Stone Study ? ?Result Date: 07/13/2021 ?CLINICAL DATA:  Left-sided abdominal pain and groin pain with nausea x3 days. EXAM: CT ABDOMEN AND PELVIS WITHOUT CONTRAST TECHNIQUE: Multidetector CT imaging of the abdomen and pelvis was performed following the standard protocol without IV contrast. RADIATION DOSE REDUCTION: This exam was performed according to the departmental dose-optimization program which includes automated exposure control, adjustment of the mA and/or kV according to patient size and/or use of iterative reconstruction technique. COMPARISON:  April 01, 2021 and October 22, 2013 FINDINGS: Lower chest: No acute abnormality. Hepatobiliary: A punctate calcified granuloma is seen within the anteromedial aspect of the right lobe of the liver. Status post cholecystectomy. No biliary dilatation. Pancreas: Unremarkable. No pancreatic ductal dilatation or surrounding inflammatory changes. Spleen: Normal in size without focal abnormality. Adrenals/Urinary Tract: Mild bilateral adrenal gland thickening and nodularity is seen. 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 There is compensatory hypertrophy of the left kidney with mild to moderate severity left-sided perinephric inflammatory fat stranding. A 5.4 cm x 3.7 cm x 4.4 cm lobulated, mildly hyperdense (approximat

## 2021-07-14 NOTE — Assessment & Plan Note (Addendum)
-  Holding lisinopril due to AKI>>will not restart ?-Continue home dose of metoprolol ?-add amlodipine in lieu of lisinopril ?

## 2021-07-14 NOTE — Assessment & Plan Note (Addendum)
-  No chest pain or shortness of breath currently ?-Continue the use of statins and low-dose beta-blocker. ?-Holding Plavix in anticipation for surgical intervention>>resume after surgery ?

## 2021-07-14 NOTE — H&P (View-Only) (Signed)
Urology Consult  ? ?Physician requesting consult: Milton Ferguson, MD ? ?Reason for consult: Left renal mass, Left hydronephrosis ? ?History of Present Illness: Grace Hess is a 66 y.o. seen in consultation from Dr. Roderic Palau for evaluation of a left renal mass and left hydronephrosis.  She presented to the emergency room yesterday with complaints of left-sided flank pain with radiation to the left groin.  No dysuria or gross hematuria.  No nausea or vomiting.  She takes oxycodone for chronic pain and has used this with some relief.  She does have a prior history of kidney stones.  CT imaging from 07/13/2021 showed a 5.4 x 3.7 x 4.4 lobulated mildly hyperdense soft tissue mass within the anterior medial aspect of the mid to lower left kidney with mass effect on the left renal pelvis and proximal left ureter with moderate to marked left hydronephrosis, numerous bilateral nonobstructing renal calculi, cortical thinning of the right kidney. ?Urinalysis: 0-5 RBCs, 0-5 WBCs, rare bacteria ?Creatinine increased from baseline of 1.3 to 2.85. ?The patient apparently left the emergency room AMA and returned today. ?Her pain has been fairly well controlled. ? ? ?Past Medical History:  ?Diagnosis Date  ? Anxiety   ? Arthritis   ? "might have a touch in my fingers" (08/02/2016)  ? CAD (coronary artery disease)   ? a. s/p DES to RCA in 07/2016 with residual mid-LAD stenosis  ? Cervicalgia   ? Chest pain, unspecified   ? GERD (gastroesophageal reflux disease)   ? Headache   ? "bad ones after my stroke in 2016; only have them when I get bad news now" (08/02/2016)  ? Heart murmur   ? Heavy smoker (more than 20 cigarettes per day)   ? Scheduled for LDCT screening 02/11/15  ? Hepatitis A   ? "related to Janine Limbo"  ? History of hiatal hernia   ? History of kidney stones   ? "no cysto/OR" (12/10/2014)  ? Hypercholesteremia   ? Hypertension   ? Obesity   ? Reflux esophagitis   ? Stroke [facility])   ? "they said I had a silent stroke a few months  back/MRI" (12/10/2014)  ? TIA (transient ischemic attack) 11/28/2014  ? [name] 11/28/2014  ? Type II diabetes mellitus ([place])   ? ? ?Past Surgical History:  ?Procedure Laterality Date  ? ABDOMINAL AORTOGRAM N/A 08/02/2016  ? Procedure: Abdominal Aortogram;  Surgeon: Nelva Bush, MD;  Location: [facility];  Service: Cardiovascular;  Laterality: N/A;  ? CAROTID STENT    ? CHOLECYSTECTOMY OPEN  1978  ? COLONOSCOPY  2010  ? single diverticulum in sigmoid colon  ? CORONARY ANGIOPLASTY WITH STENT PLACEMENT  08/02/2016  ? to RCA  ? CORONARY STENT INTERVENTION N/A 08/02/2016  ? Procedure: Coronary Stent Intervention;  Surgeon: Nelva Bush, MD;  Location: [facility];  Service: Cardiovascular;  Laterality: N/A;  RCA  ? ENDARTERECTOMY Left 12/02/2014  ? Procedure: ENDARTERECTOMY CAROTID;  Surgeon: Rosetta Posner, MD;  Location: [facility];  Service: Vascular;  Laterality: Left;  ? LEFT HEART CATH AND CORONARY ANGIOGRAPHY N/A 08/02/2016  ? Procedure: Left Heart Cath and Coronary Angiography;  Surgeon: Nelva Bush, MD;  Location: [facility];  Service: Cardiovascular;  Laterality: N/A;  ? LOWER EXTREMITY ANGIOGRAM  08/02/2016  ? LOWER EXTREMITY ANGIOGRAPHY N/A 08/02/2016  ? Procedure: Lower Extremity Angiography;  Surgeon: Nelva Bush, MD;  Location: [facility];  Service: Cardiovascular;  Laterality: N/A;  ? VAGINAL HYSTERECTOMY  1991  ? "partial"  ? ? ?Medications: ?Scheduled Meds: ?Continuous Infusions: ?PRN Meds:. ? ?Allergies:  ?Allergies  ?Allergen Reactions  ? Codeine Shortness Of Breath  ? Latex Itching  ? Sulfa Antibiotics Other (See Comments)  ?  Reaction:  Unknown   ? ? ?Family History  ?Problem Relation Age of Onset  ? Congestive Heart Failure Mother   ? COPD Father   ? Cancer Brother   ? Cancer Brother   ? ? ?Social History:  reports that she has been smoking cigarettes. She started smoking about 47 years ago. She has a 64.50 pack-year smoking history. She has never used  smokeless tobacco. She reports that she does not drink alcohol and does not use drugs. ? ?ROS: ?A complete review of systems was performed.  All systems are negative except for pertinent findings as noted. ? ?Physical Exam:  ?Vital signs in last 24 hours: ?Temp:  [97.6 ?F (36.4 ?C)] 97.6 ?F (36.4 ?C) (05/02 1028) ?Pulse Rate:  [62-64] 64 (05/02 1028) ?Resp:  [16-18] 18 (05/02 1028) ?BP: (90-122)/(50-54) 90/54 (05/02 1028) ?SpO2:  [94 %] 94 % (05/02 1028) ?Weight:  [66.7 kg] 66.7 kg (05/02 1030) ?GENERAL APPEARANCE:  Well appearing, well developed, well nourished, NAD ?HEENT: Atraumatic, Normocephalic, oropharynx clear. ?NECK: Supple without lymphadenopathy or thyromegaly. ?LUNGS: Clear to auscultation bilaterally. ?HEART: Regular Rate and Rhythm without murmurs, gallops, or rubs. ?ABDOMEN: Soft, non-tender, No Masses. ?EXTREMITIES: Moves all extremities well.  Without clubbing, cyanosis, or edema. ?NEUROLOGIC:  Alert and oriented x 3, normal gait, CN II-XII grossly intact.  ?MENTAL STATUS:  Appropriate. ?BACK:  Non-tender to palpation.  Mild left CVA tenderness. ?SKIN:  Warm, dry and intact.   ? ?Laboratory Data:  ?Recent Labs  ?  07/13/21 ?2671 07/14/21 ?1048  ?WBC 13.7* 13.0*  ?HGB 11.8* 11.4*  ?HCT 36.8 36.1  ?PLT 250 263  ? ? ?Recent Labs  ?  07/13/21 ?1659 07/14/21 ?1048  ?NA 136 136  ?K 4.5 5.1  ?CL 103 102  ?GLUCOSE 228* 202*  ?BUN 62* 79*  ?CALCIUM 9.0 8.8*  ?CREATININE 2.85* 3.61*  ? ? ? ?Results for orders placed or performed during the hospital encounter of 07/14/21 (from the past 24 hour(s))  ?CBC with Differential     Status: Abnormal  ? Collection Time: 07/14/21 10:48 AM  ?Result Value Ref Range  ? WBC 13.0 (H) 4.0 - 10.5 K/uL  ? RBC 3.87 3.87 - 5.11 MIL/uL  ? Hemoglobin 11.4 (L) 12.0 - 15.0 g/dL  ? HCT 36.1 36.0 - 46.0 %  ? MCV 93.3 80.0 - 100.0 fL  ? MCH 29.5 26.0 - 34.0 pg  ? MCHC 31.6 30.0 - 36.0 g/dL  ? RDW 14.4 11.5 - 15.5 %  ? Platelets 263 150 - 400 K/uL  ? nRBC 0.0 0.0 - 0.2 %  ? Neutrophils  Relative % 74 %  ? Neutro Abs 9.6 (H) 1.7 - 7.7 K/uL  ? Lymphocytes Relative 13 %  ? Lymphs Abs 1.7 0.7 - 4.0 K/uL  ? Monocytes Relative 9 %  ? Monocytes Absolute 1.2 (H) 0.1 - 1.0 K/uL  ? Eosinophils Relative 2 %  ? Eosinophils Absolute 0.3 0.0 - 0.5 K/uL  ? Basophils Relative 1 %  ? Basophils Absolute 0.1 0.0 - 0.1 K/uL  ? Immature Granulocytes 1 %  ? Abs Immature Granulocytes 0.09 (H) 0.00 - 0.07 K/uL  ?Comprehensive metabolic panel     Status: Abnormal  ? Collection Time: 07/14/21 10:48 AM  ?Result Value Ref  Range  ? Sodium 136 135 - 145 mmol/L  ? Potassium 5.1 3.5 - 5.1 mmol/L  ? Chloride 102 98 - 111 mmol/L  ? CO2 22 22 - 32 mmol/L  ? Glucose, Bld 202 (H) 70 - 99 mg/dL  ? BUN 79 (H) 8 - 23 mg/dL  ? Creatinine, Ser 3.61 (H) 0.44 - 1.00 mg/dL  ? Calcium 8.8 (L) 8.9 - 10.3 mg/dL  ? Total Protein 7.1 6.5 - 8.1 g/dL  ? Albumin 3.6 3.5 - 5.0 g/dL  ? AST 25 15 - 41 U/L  ? ALT 25 0 - 44 U/L  ? Alkaline Phosphatase 87 38 - 126 U/L  ? Total Bilirubin 0.4 0.3 - 1.2 mg/dL  ? GFR, Estimated 13 (L) >60 mL/min  ? Anion gap 12 5 - 15  ? ?No results found for this or any previous visit (from the past 240 hour(s)). ? ?Renal Function: ?Recent Labs  ?  07/13/21 ?1659 07/14/21 ?1048  ?CREATININE 2.85* 3.61*  ? ?Estimated Creatinine Clearance: 14.4 mL/min (A) (by C-G formula based on SCr of 3.61 mg/dL (H)). ? ?Radiologic Imaging: ?CT Renal Stone Study ? ?Result Date: 07/13/2021 ?CLINICAL DATA:  Left-sided abdominal pain and groin pain with nausea x3 days. EXAM: CT ABDOMEN AND PELVIS WITHOUT CONTRAST TECHNIQUE: Multidetector CT imaging of the abdomen and pelvis was performed following the standard protocol without IV contrast. RADIATION DOSE REDUCTION: This exam was performed according to the departmental dose-optimization program which includes automated exposure control, adjustment of the mA and/or kV according to patient size and/or use of iterative reconstruction technique. COMPARISON:  April 01, 2021 and October 22, 2013  FINDINGS: Lower chest: No acute abnormality. Hepatobiliary: A punctate calcified granuloma is seen within the anteromedial aspect of the right lobe of the liver. Status post cholecystectomy. No biliary dilatation.

## 2021-07-14 NOTE — Assessment & Plan Note (Addendum)
-  Baseline creatinine 1.5-1.7 ?-Patient presented with serum creatinine 3.61 ?-due to progression of renal dysfunction in the setting of diabetes and hypertension the presence of renal mass with ongoing obstruction and hydronephrosis. ?-Gentle IV fluids>>continue ?-Appreciate urology service for intervention ?-5/3>>left ureteral stent place ?Serum creatinine 1.73 on day of dc ?

## 2021-07-14 NOTE — Assessment & Plan Note (Addendum)
-   Continue the use of statins. 

## 2021-07-15 ENCOUNTER — Encounter (HOSPITAL_COMMUNITY): Payer: Self-pay | Admitting: Internal Medicine

## 2021-07-15 ENCOUNTER — Inpatient Hospital Stay (HOSPITAL_COMMUNITY): Payer: HMO

## 2021-07-15 ENCOUNTER — Encounter (HOSPITAL_COMMUNITY)
Admission: EM | Disposition: A | Discharge status: Home / Self Care | Payer: Self-pay | Source: Home / Self Care | Attending: Internal Medicine

## 2021-07-15 ENCOUNTER — Inpatient Hospital Stay (HOSPITAL_COMMUNITY): Payer: HMO | Admitting: Anesthesiology

## 2021-07-15 DIAGNOSIS — N179 Acute kidney failure, unspecified: Secondary | ICD-10-CM | POA: Diagnosis not present

## 2021-07-15 DIAGNOSIS — N1330 Unspecified hydronephrosis: Secondary | ICD-10-CM | POA: Diagnosis not present

## 2021-07-15 DIAGNOSIS — N1832 Chronic kidney disease, stage 3b: Secondary | ICD-10-CM | POA: Diagnosis not present

## 2021-07-15 DIAGNOSIS — F418 Other specified anxiety disorders: Secondary | ICD-10-CM | POA: Diagnosis not present

## 2021-07-15 DIAGNOSIS — N2889 Other specified disorders of kidney and ureter: Secondary | ICD-10-CM

## 2021-07-15 DIAGNOSIS — I2510 Atherosclerotic heart disease of native coronary artery without angina pectoris: Secondary | ICD-10-CM

## 2021-07-15 HISTORY — PX: CYSTOSCOPY W/ RETROGRADES: SHX1426

## 2021-07-15 HISTORY — PX: CYSTOSCOPY WITH STENT PLACEMENT: SHX5790

## 2021-07-15 LAB — GLUCOSE, CAPILLARY
Glucose-Capillary: 101 mg/dL — ABNORMAL HIGH (ref 70–99)
Glucose-Capillary: 70 mg/dL (ref 70–99)
Glucose-Capillary: 86 mg/dL (ref 70–99)
Glucose-Capillary: 102 mg/dL — ABNORMAL HIGH (ref 70–99)
Glucose-Capillary: 140 mg/dL — ABNORMAL HIGH (ref 70–99)

## 2021-07-15 LAB — CBC
HCT: 35.6 % — ABNORMAL LOW (ref 36.0–46.0)
Hemoglobin: 11 g/dL — ABNORMAL LOW (ref 12.0–15.0)
MCH: 29.4 pg (ref 26.0–34.0)
MCHC: 30.9 g/dL (ref 30.0–36.0)
MCV: 95.2 fL (ref 80.0–100.0)
Platelets: 236 10*3/uL (ref 150–400)
RBC: 3.74 MIL/uL — ABNORMAL LOW (ref 3.87–5.11)
RDW: 14.2 % (ref 11.5–15.5)
WBC: 10.6 10*3/uL — ABNORMAL HIGH (ref 4.0–10.5)
nRBC: 0 % (ref 0.0–0.2)

## 2021-07-15 LAB — BASIC METABOLIC PANEL
Anion gap: 9 (ref 5–15)
BUN: 73 mg/dL — ABNORMAL HIGH (ref 8–23)
CO2: 19 mmol/L — ABNORMAL LOW (ref 22–32)
Calcium: 8.7 mg/dL — ABNORMAL LOW (ref 8.9–10.3)
Chloride: 112 mmol/L — ABNORMAL HIGH (ref 98–111)
Creatinine, Ser: 2.75 mg/dL — ABNORMAL HIGH (ref 0.44–1.00)
GFR, Estimated: 18 mL/min — ABNORMAL LOW (ref >60)
Glucose, Bld: 116 mg/dL — ABNORMAL HIGH (ref 70–99)
Potassium: 5.2 mmol/L — ABNORMAL HIGH (ref 3.5–5.1)
Sodium: 140 mmol/L (ref 135–145)

## 2021-07-15 LAB — SURGICAL PCR SCREEN
MRSA, PCR: NEGATIVE
Staphylococcus aureus: POSITIVE — AB

## 2021-07-15 SURGERY — CYSTOSCOPY, WITH RETROGRADE PYELOGRAM
Anesthesia: General | Site: Ureter | Laterality: Left

## 2021-07-15 MED ORDER — LIDOCAINE HCL (PF) 2 % IJ SOLN
INTRAMUSCULAR | Status: AC
  Filled 2021-07-15: qty 10

## 2021-07-15 MED ORDER — ONDANSETRON HCL 4 MG/2ML IJ SOLN
INTRAMUSCULAR | Status: DC | PRN
  Administered 2021-07-15: 4 mg via INTRAVENOUS

## 2021-07-15 MED ORDER — SODIUM CHLORIDE 0.9 % IR SOLN
Status: DC | PRN
  Administered 2021-07-15: 3000 mL

## 2021-07-15 MED ORDER — CEFAZOLIN SODIUM-DEXTROSE 2-4 GM/100ML-% IV SOLN
INTRAVENOUS | Status: AC
  Filled 2021-07-15: qty 100

## 2021-07-15 MED ORDER — CHLORHEXIDINE GLUCONATE CLOTH 2 % EX PADS
6.0000 | MEDICATED_PAD | Freq: Every day | CUTANEOUS | Status: DC
  Administered 2021-07-15 – 2021-07-17 (×3): 6 via TOPICAL

## 2021-07-15 MED ORDER — CHLORHEXIDINE GLUCONATE 0.12 % MT SOLN: 15.0000 mL | Freq: Once | OROMUCOSAL | Status: DC

## 2021-07-15 MED ORDER — PROPOFOL 10 MG/ML IV BOLUS
INTRAVENOUS | Status: AC
  Filled 2021-07-15: qty 20

## 2021-07-15 MED ORDER — STERILE WATER FOR IRRIGATION IR SOLN
Status: DC | PRN
  Administered 2021-07-15: 500 mL

## 2021-07-15 MED ORDER — LOPERAMIDE HCL 2 MG PO CAPS
2.0000 mg | ORAL_CAPSULE | Freq: Once | ORAL | Status: AC
  Administered 2021-07-15: 2 mg via ORAL
  Filled 2021-07-15: qty 1

## 2021-07-15 MED ORDER — LACTATED RINGERS IV SOLN: INTRAVENOUS | Status: DC | PRN

## 2021-07-15 MED ORDER — PHENAZOPYRIDINE HCL 100 MG PO TABS: 200.0000 mg | ORAL_TABLET | Freq: Three times a day (TID) | ORAL | Status: DC | PRN

## 2021-07-15 MED ORDER — MIDAZOLAM HCL 5 MG/5ML IJ SOLN
INTRAMUSCULAR | Status: DC | PRN
  Administered 2021-07-15: 2 mg via INTRAVENOUS

## 2021-07-15 MED ORDER — MUPIROCIN 2 % EX OINT
1.0000 "application " | TOPICAL_OINTMENT | Freq: Two times a day (BID) | CUTANEOUS | Status: DC
  Administered 2021-07-15 – 2021-07-17 (×4): 1 via NASAL
  Filled 2021-07-15: qty 22

## 2021-07-15 MED ORDER — FENTANYL CITRATE (PF) 100 MCG/2ML IJ SOLN
INTRAMUSCULAR | Status: DC | PRN
  Administered 2021-07-15 (×2): 50 ug via INTRAVENOUS

## 2021-07-15 MED ORDER — EPHEDRINE SULFATE (PRESSORS) 50 MG/ML IJ SOLN
INTRAMUSCULAR | Status: DC | PRN
  Administered 2021-07-15 (×2): 10 mg via INTRAVENOUS
  Administered 2021-07-15: 5 mg via INTRAVENOUS

## 2021-07-15 MED ORDER — ORAL CARE MOUTH RINSE: 15.0000 mL | Freq: Once | OROMUCOSAL | Status: DC

## 2021-07-15 MED ORDER — EPHEDRINE 5 MG/ML INJ
INTRAVENOUS | Status: AC
  Filled 2021-07-15: qty 5

## 2021-07-15 MED ORDER — FENTANYL CITRATE (PF) 100 MCG/2ML IJ SOLN
INTRAMUSCULAR | Status: AC
  Filled 2021-07-15: qty 2

## 2021-07-15 MED ORDER — LIDOCAINE HCL URETHRAL/MUCOSAL 2 % EX GEL
CUTANEOUS | Status: AC
  Filled 2021-07-15: qty 20

## 2021-07-15 MED ORDER — LACTATED RINGERS IV SOLN: INTRAVENOUS | Status: DC

## 2021-07-15 MED ORDER — LIDOCAINE HCL URETHRAL/MUCOSAL 2 % EX GEL
CUTANEOUS | Status: DC | PRN
  Administered 2021-07-15: 1

## 2021-07-15 MED ORDER — MUPIROCIN 2 % EX OINT
1.0000 "application " | TOPICAL_OINTMENT | Freq: Two times a day (BID) | CUTANEOUS | Status: DC
  Administered 2021-07-15 – 2021-07-17 (×5): 1 via NASAL

## 2021-07-15 MED ORDER — NICOTINE 21 MG/24HR TD PT24
21.0000 mg | MEDICATED_PATCH | Freq: Every day | TRANSDERMAL | Status: DC
  Administered 2021-07-15 – 2021-07-17 (×3): 21 mg via TRANSDERMAL
  Filled 2021-07-15 (×3): qty 1

## 2021-07-15 MED ORDER — ONDANSETRON HCL 4 MG/2ML IJ SOLN
INTRAMUSCULAR | Status: AC
  Filled 2021-07-15: qty 4

## 2021-07-15 MED ORDER — DIATRIZOATE MEGLUMINE 30 % UR SOLN
URETHRAL | Status: DC | PRN
  Administered 2021-07-15: 20 mL via URETHRAL

## 2021-07-15 MED ORDER — PROPOFOL 10 MG/ML IV BOLUS
INTRAVENOUS | Status: DC | PRN
  Administered 2021-07-15: 140 mg via INTRAVENOUS

## 2021-07-15 MED ORDER — MIDAZOLAM HCL 2 MG/2ML IJ SOLN
INTRAMUSCULAR | Status: AC
  Filled 2021-07-15: qty 2

## 2021-07-15 MED ORDER — FENTANYL CITRATE PF 50 MCG/ML IJ SOSY
25.0000 ug | PREFILLED_SYRINGE | INTRAMUSCULAR | Status: DC | PRN
  Administered 2021-07-15: 50 ug via INTRAVENOUS
  Filled 2021-07-15: qty 1

## 2021-07-15 MED ORDER — ONDANSETRON HCL 4 MG/2ML IJ SOLN: 4.0000 mg | Freq: Once | INTRAMUSCULAR | Status: DC | PRN

## 2021-07-15 SURGICAL SUPPLY — 19 items
BAG DRAIN URO TABLE W/ADPT NS (BAG) ×4 IMPLANT
BAG DRN 8 ADPR NS SKTRN CSTL (BAG) ×2
BAG HAMPER (MISCELLANEOUS) ×4 IMPLANT
CATH URET 5FR 28IN OPEN ENDED (CATHETERS) ×4 IMPLANT
CLOTH BEACON ORANGE TIMEOUT ST (SAFETY) ×4 IMPLANT
GLOVE BIO SURGEON STRL SZ8 (GLOVE) ×4 IMPLANT
GLOVE BIOGEL PI IND STRL 7.0 (GLOVE) ×6 IMPLANT
GLOVE BIOGEL PI INDICATOR 7.0 (GLOVE) ×2
GOWN STRL REUS W/TWL LRG LVL3 (GOWN DISPOSABLE) ×8 IMPLANT
GOWN STRL REUS W/TWL XL LVL3 (GOWN DISPOSABLE) ×4 IMPLANT
GUIDEWIRE STR DUAL SENSOR (WIRE) ×1 IMPLANT
IV NS IRRIG 3000ML ARTHROMATIC (IV SOLUTION) ×4 IMPLANT
KIT TURNOVER CYSTO (KITS) ×4 IMPLANT
MANIFOLD NEPTUNE II (INSTRUMENTS) ×4 IMPLANT
PACK CYSTO (CUSTOM PROCEDURE TRAY) ×4 IMPLANT
PAD ARMBOARD 7.5X6 YLW CONV (MISCELLANEOUS) ×4 IMPLANT
STENT URET 6FRX26 CONTOUR (STENTS) ×1 IMPLANT
TOWEL OR 17X26 4PK STRL BLUE (TOWEL DISPOSABLE) ×4 IMPLANT
WATER STERILE IRR 500ML POUR (IV SOLUTION) ×4 IMPLANT

## 2021-07-15 NOTE — Assessment & Plan Note (Addendum)
Holding glipizide and Ozempic temporarily>>resume after d/c ?NovoLog sliding scale ?07/01/2021 hemoglobin A1c 8.1 ?

## 2021-07-15 NOTE — Op Note (Signed)
OPERATIVE NOTE ? ? ?Patient Name: Grace Hess ? ?MRN:  [id number] ? ?Date of Procedure: 07/15/21 ? ?Preoperative diagnosis:  ?Left hydronephrosis ?Left renal mass ?Acute on chronic kidney failure ? ?Postoperative diagnosis:  ?Left hydronephrosis secondary to left renal mass ?Left renal mass ?Acute on chronic kidney failure ? ?Procedure:  ?Cystoscopy ?Bilateral retrograde pyelograms ?Insertion of left ureteral stent (6 [place] by 26 cm, no tether) ? ?Attending: Primus Bravo, MD ? ?Anesthesia: General ? ?Estimated blood loss: None ? ?Fluids: Per anesthesia record ? ?Drains: 6 French by 26 cm left ureteral stent, no tether ? ?Specimens: None ? ?Antibiotics: Ancef 2 g IV ? ?Findings: Normal urethra and bladder; normal left retrograde pyelogram; normal left distal and mid ureter; dilation of the left renal pelvis and calyces secondary to extrinsic compression on the proximal left ureter ? ?Indications:  ?66 year old female who was found to have a left renal mass as well as left hydronephrosis during evaluation for left-sided flank pain.  CT imaging showed a 5.4 x 3.7 x 4.4 lobulated mildly hyperdense soft tissue mass in the mid to lower left kidney with apparent mass effect on the left renal pelvis and proximal left ureter with moderate to marked left hydronephrosis.  Her creatinine on presentation was 2.85 and increased to 3.61 yesterday.  Her creatinine today has decreased to 2.75.  Her pain has been fairly well controlled.  No dysuria or gross hematuria.  She presents now for cystoscopy with bilateral retrograde pyelograms, and insertion of left ureteral stent for management of the left hydronephrosis.  Risk and benefits of the procedure discussed in detail.  She understands wishes to proceed as described. ? ?Description of Procedure:  ?The patient received IV Ancef preoperatively.  After successful induction of a general anesthetic, the patient was placed in the dorsolithotomy position.  The patient's  genital area was prepped and draped in sterile fashion.  Under direct visualization, a 21 French rigid cystoscope was passed through the urethra and into the bladder.  No urethral or bladder abnormalities were noted.  No mucosal lesions were seen.  A normal-appearing trigone was seen. ? ?Bilateral retrograde pyelograms were performed for evaluation of the patient's upper tracts given the history of a left renal mass as well as left hydronephrosis and acute on chronic kidney failure.  Scout film showed a nonspecific bowel gas pattern and no obvious bony abnormalities.  Using a 5 [place] open-ended catheter, contrast was injected into the right ureter.  No filling defect or evidence of obstruction was seen.  In a similar fashion, contrast was injected to the left ureter.  The left distal and mid ureter were normal without evidence of filling defect or obstruction.  There appeared to be extrinsic compression of the left proximal ureter with significant dilation of the left renal pelvis and calyces.  No filling defects were seen in the proximal ureter or the renal pelvis. ? ?A sensor guidewire was passed through the open-ended catheter and into the left renal pelvis.  Position of the guidewire was confirmed with passage of the open-ended catheter into the dilated left renal pelvis.  A hydronephrotic drip was noted.  Injection of contrast confirmed position within the left renal pelvis.  The guidewire was replaced.  A 6 French by 26 cm double-J stent was then passed over the guidewire and into the left renal pelvis.  Good position was confirmed with fluoroscopy.  The tether was removed prior to stent placement.  Efflux of urine was noted from the stent.  The bladder was then drained and the cystoscope was removed.  The patient was given intraurethral lidocaine jelly.  She was then extubated and taken to the postanesthesia care unit in stable condition. ? ?Complications: None ? ?Condition: Stable, extubated, transferred to  PACU ? ?Plan:  ?Continue left ureteral stent. ?Follow renal function. ?Will need further evaluation of the left renal mass with a MRI abdomen with and without contrast once her renal function has returned to baseline. ?She will need outpatient follow-up for further management of the left renal mass. ? ?

## 2021-07-15 NOTE — Hospital Course (Signed)
66 year old female with a history of diabetes mellitus type 2, hypertension, hyperlipidemia, TIA, CKD stage III, chronic back pain, coronary disease with stent 2008, tobacco abuse presenting with left flank pain of 4 days duration.  The patient was in the ED on 07/13/2021 with the same complaints.  CT of the abdomen with renal protocol at that time showed a 5.4 cm left renal mass with mass effect on the left renal pelvis and proximal left ureter.  The patient left AMA secondary to some social circumstances.  She returns with continued left flank pain on 07/14/2021.  She has had some subjective fevers and chills.  She has had some nausea without emesis.  She denies any headache, chest pain, shortness of breath, hematuria, hematochezia, melena.  She has had some loose stools. ?In the ED, the patient was afebrile and hemodynamically stable.  WBC 13.0, hemoglobin 11.8, platelets 250,000.  Sodium 136, potassium 5.1, bicarbonate 22, serum creatinine 3.61.  Urology was consulted and plans for left ureteral stent on 07/15/2021. ?

## 2021-07-15 NOTE — Interval H&P Note (Signed)
History and Physical Interval Note: ? ?07/15/2021 ?1:50 PM ? ?Grace Hess  has presented today for surgery, with the diagnosis of Left renal mass, left hydronephrosis.  The various methods of treatment have been discussed with the patient and family. After consideration of risks, benefits and other options for treatment, the patient has consented to  Procedure(s): ?CYSTOSCOPY WITH RETROGRADE PYELOGRAM (Bilateral) ?CYSTOSCOPY WITH STENT PLACEMENT (Left) as a surgical intervention.  The patient's history has been reviewed, patient examined, no change in status, stable for surgery.  I have reviewed the patient's chart and labs.  Questions were answered to the patient's satisfaction.   ? ? ?Leory Plowman Meela Wareing ? ? ?

## 2021-07-15 NOTE — TOC Initial Note (Signed)
Transition of Care (TOC) - Initial/Assessment Note  ? ? ?Patient Details  ?Name: Grace Hess ?MRN: [id number] ?Date of Birth: 1955-03-28 ? ?Transition of Care (TOC) CM/SW Contact:    ?Suleyma Wafer D, LCSW ?Phone Number: ?07/15/2021, 4:52 PM ? ?Clinical Narrative:                 ?Patient from home with spouse, son lives there off and on. Patient is independent at baseline, drives, maintains medical appointments, has no DME.  ? ? ?Expected Discharge Plan: Home/Self Care ?Barriers to Discharge: Continued Medical Work up ? ? ?Patient Goals and CMS Choice ?Patient states their goals for this hospitalization and ongoing recovery are:: return home ?  ?  ? ?Expected Discharge Plan and Services ?Expected Discharge Plan: Home/Self Care ?  ?  ?  ?Living arrangements for the past 2 months: [place] ?                ?  ?  ?  ?  ?  ?  ?  ?  ?  ?  ? ?Prior Living Arrangements/Services ?Living arrangements for the past 2 months: [place] ?Lives with:: Adult Children ?  ?       ?  ?  ?  ?  ? ?Activities of Daily Living ?Home Assistive Devices/Equipment: None ?ADL Screening (condition at time of admission) ?Patient's cognitive ability adequate to safely complete daily activities?: Yes ?Is the patient deaf or have difficulty hearing?: No ?Does the patient have difficulty seeing, even when wearing glasses/contacts?: No ?Does the patient have difficulty concentrating, remembering, or making decisions?: No ?Patient able to express need for assistance with ADLs?: No ?Does the patient have difficulty dressing or bathing?: No ?Independently performs ADLs?: Yes (appropriate for developmental age) ?Does the patient have difficulty walking or climbing stairs?: No ?Weakness of Legs: None ?Weakness of Arms/Hands: None ? ?Permission Sought/Granted ?Permission sought to share information with : Family Supports ?  ? Share Information with NAME: Mr. Warmack, spouse ?   ?   ?   ? ?Emotional Assessment ?  ?  ?  ?  ?  ?   ? ?Admission diagnosis:  Left renal mass [N28.89] ?Acute renal failure with other specified pathological lesion in kidney [facility]) [N17.8] ?Acute renal failure superimposed on stage 4 chronic kidney disease, unspecified acute renal failure type ([place]) [N17.9, N18.4] ?Patient Active Problem List  ? Diagnosis Date Noted  ? Left renal mass   ? Acute renal failure superimposed on stage 3b chronic kidney disease ([place]) 07/14/2021  ? Perianal abscess 03/30/2021  ? Vitamin D deficiency 08/23/2019  ? Loss of weight 07/17/2019  ? PAD (peripheral artery disease) ([place]) 07/04/2019  ? IBS (irritable bowel syndrome) 01/16/2019  ? Uncontrolled type 2 diabetes mellitus with hyperglycemia, without long-term current use of insulin ([place]) 01/08/2019  ? Type 2 diabetes mellitus without complication ([place]) [id number]/[id number]  ? Lumbar back pain 12/21/2018  ? Mixed hyperlipidemia 12/21/2018  ? CAD S/P percutaneous coronary angioplasty 08/03/2016  ? Claudication ([place]) 08/02/2016  ? Abnormal stress test 08/02/2016  ? Stable angina ([place]) 08/02/2016  ? Heavy smoker (more than 20 cigarettes per day)   ? Headache 12/10/2014  ? Carotid artery disease ([place]) 12/01/2014  ? TIA (transient ischemic attack) 11/28/2014  ? Tobacco use disorder 11/28/2014  ? HYPERCHOLESTEROLEMIA  IIA 09/13/2008  ? Depression with anxiety 09/13/2008  ? Essential hypertension, benign 09/13/2008  ? GERD (gastroesophageal reflux disease) 09/13/2008  ? Chest pain with high risk for  cardiac etiology 09/13/2008  ? ?PCP:  Redmond School, MD ?Pharmacy:   ?[facility], [place] [facility] [place] ?[address] ?[place] [address] ?Phone: [phone] Fax: [phone] ? ? ? ? ?Social Determinants of Health (SDOH) Interventions ?  ? ?Readmission Risk Interventions ?   ? View : No data to display.  ?  ?  ?  ? ? ? ?

## 2021-07-15 NOTE — Anesthesia Preprocedure Evaluation (Addendum)
Anesthesia Evaluation  ?Patient identified by MRN, date of birth, ID band ?Patient awake ? ? ? ?Reviewed: ?Allergy & Precautions, H&P , NPO status , Patient's Chart, lab work & pertinent test results, reviewed documented beta blocker date and time  ? ?Airway ?Mallampati: II ? ?TM Distance: >3 FB ?Neck ROM: full ? ? ? Dental ?no notable dental hx. ? ?  ?Pulmonary ?neg pulmonary ROS, Current Smoker and Patient abstained from smoking.,  ?  ?Pulmonary exam normal ?breath sounds clear to auscultation ? ? ? ? ? ? Cardiovascular ?Exercise Tolerance: Good ?hypertension, + CAD  ? ?Rhythm:regular Rate:Normal ? ? ?  ?Neuro/Psych ? Headaches, PSYCHIATRIC DISORDERS Anxiety Depression TIA  ? GI/Hepatic ?Neg liver ROS, hiatal hernia, GERD  Medicated,  ?Endo/Other  ?negative endocrine ROSdiabetes ? Renal/GU ?negative Renal ROS  ?negative genitourinary ?  ?Musculoskeletal ? ? Abdominal ?  ?Peds ? Hematology ?negative hematology ROS ?(+)   ?Anesthesia Other Findings ??1. Left ventricular ejection fraction, by visual estimation, is 65 to  ?70%. The left ventricle has normal function. There is mildly increased  ?left ventricular hypertrophy.  ??2. Global right ventricle has normal systolic function.The right  ?ventricular size is normal. No increase in right ventricular wall  ?thickness.  ??3. Left atrial size was normal.  ??4. Right atrial size was normal.  ??5. Presence of pericardial fat pad.  ??6. The pericardial effusion is circumferential.  ??7. Trivial pericardial effusion is present.  ??8. Mild aortic valve annular calcification.  ??9. The mitral valve is normal in structure. No evidence of mitral valve  ?regurgitation. No evidence of mitral stenosis.  ?10. The tricuspid valve is normal in structure. Tricuspid valve  ?regurgitation is not demonstrated.  ?11. The aortic valve has an indeterminant number of cusps. Aortic valve  ?regurgitation is not visualized. Mild aortic valve sclerosis without   ?stenosis.  ?12. There is Mild calcification of the aortic valve.  ?13. There is Mild thickening of the aortic valve.  ?14. The pulmonic valve was not well visualized. Pulmonic valve  ?regurgitation is not visualized.  ? Reproductive/Obstetrics ?negative OB ROS ? ?  ? ? ? ? ? ? ? ? ? ? ? ? ? ?  ?  ? ? ? ? ? ? ? ?Anesthesia Physical ?Anesthesia Plan ? ?ASA: 3 ? ?Anesthesia Plan: General and General LMA  ? ?Post-op Pain Management:   ? ?Induction:  ? ?PONV Risk Score and Plan: Ondansetron ? ?Airway Management Planned:  ? ?Additional Equipment:  ? ?Intra-op Plan:  ? ?Post-operative Plan:  ? ?Informed Consent: I have reviewed the patients History and Physical, chart, labs and discussed the procedure including the risks, benefits and alternatives for the proposed anesthesia with the patient or authorized representative who has indicated his/her understanding and acceptance.  ? ? ? ?Dental Advisory Given ? ?Plan Discussed with: CRNA ? ?Anesthesia Plan Comments:   ? ? ? ? ?Anesthesia Quick Evaluation ? ?

## 2021-07-15 NOTE — Anesthesia Procedure Notes (Signed)
Procedure Name: LMA Insertion ?Date/Time: 07/15/2021 2:06 PM ?Performed by: Ollen Bowl, CRNA ?Pre-anesthesia Checklist: Patient identified, Patient being monitored, Emergency Drugs available, Timeout performed and Suction available ?Patient Re-evaluated:Patient Re-evaluated prior to induction ?Oxygen Delivery Method: Circle System Utilized ?Preoxygenation: Pre-oxygenation with 100% oxygen ?Induction Type: IV induction ?Ventilation: Mask ventilation without difficulty ?LMA: LMA inserted ?LMA Size: 4.0 ?Number of attempts: 1 ?Placement Confirmation: positive ETCO2 and breath sounds checked- equal and bilateral ? ? ? ? ?

## 2021-07-15 NOTE — Progress Notes (Signed)
?  ?       ?PROGRESS NOTE ? ?Grace Hess LOV:[id number] DOB: 1955-03-21 DOA: 07/14/2021 ?PCP: Redmond School, MD ? ?Brief History:  ?66 year old female with a history of diabetes mellitus type 2, hypertension, hyperlipidemia, TIA, CKD stage III, chronic back pain, coronary disease with stent 2008, tobacco abuse presenting with left flank pain of 4 days duration.  The patient was in the ED on 07/13/2021 with the same complaints.  CT of the abdomen with renal protocol at that time showed a 5.4 cm left renal mass with mass effect on the left renal pelvis and proximal left ureter.  The patient left AMA secondary to some social circumstances.  She returns with continued left flank pain on 07/14/2021.  She has had some subjective fevers and chills.  She has had some nausea without emesis.  She denies any headache, chest pain, shortness of breath, hematuria, hematochezia, melena.  She has had some loose stools. ?In the ED, the patient was afebrile and hemodynamically stable.  WBC 13.0, hemoglobin 11.8, platelets 250,000.  Sodium 136, potassium 5.1, bicarbonate 22, serum creatinine 3.61.  Urology was consulted and plans for left ureteral stent on 07/15/2021.  ? ? ? ?Assessment and Plan: ?* Acute renal failure superimposed on stage 3b chronic kidney disease ([place]) ?-Baseline creatinine 1.5-1.7 ?-Patient presented with serum creatinine 3.61 ?-due to progression of renal dysfunction in the setting of diabetes and hypertension the presence of renal mass with ongoing obstructions and hydronephrosis. ?-Gentle IV fluids ?-Appreciate urology service for intervention ?-Mild hyperphosphatemia appreciated; stable potassium levels. ? ?Uncontrolled type 2 diabetes mellitus with hyperglycemia, without long-term current use of insulin ([place]) ?Holding glipizide and Ozempic ?NovoLog sliding scale ?07/01/2021 hemoglobin A1c 8.1 ? ?CAD S/P percutaneous coronary angioplasty ?-No chest pain or shortness of breath currently ?-Continue the use of  statins and low-dose beta-blocker. ?-Holding Plavix in anticipation for surgical intervention. ? ?GERD (gastroesophageal reflux disease) ?- Continue PPI. ? ?Essential hypertension, benign ?-Holding lisinopril due to AKI ?-Continue home dose of metoprolol ? ?Depression with anxiety ?- Stable mood currently ?-No suicidal ideation or hallucination  ?-resume home anxiolytics and depressant agents. ? ?HYPERCHOLESTEROLEMIA  IIA ?- Continue the use of statins. ? ? ? ? ? ?Family Communication:   spouse updated at bedside 5/3 ? ?Consultants:  urology ? ?Code Status:  FULL ? ?DVT Prophylaxis:  [place] Heparin ? ? ?Procedures: ?As Listed in Progress Note Above ? ?Antibiotics: ?None ? ? ? ? ?Subjective: ?Complains of left flank pain.  Patient denies fevers, chills, headache, chest pain, dyspnea, nausea, vomiting, diarrhea, abdominal pain, dysuria, hematuria, hematochezia, and melena. ? ? ?Objective: ?Vitals:  ? 07/14/21 1600 07/14/21 2102 07/14/21 2350 07/15/21 0530  ?BP: (!) 108/52 (!) 124/54 (!) 127/56 (!) 145/59  ?Pulse: 61 65 62 73  ?Resp: '20 18 18 18  '$ ?Temp: 97.8 ?F (36.6 ?C) 98.3 ?F (36.8 ?C) 98.2 ?F (36.8 ?C) 98.4 ?F (36.9 ?C)  ?TempSrc: Oral  Oral   ?SpO2: 92% 91% 92% 93%  ?Weight:    67.4 kg  ?Height:      ? ? ?Intake/Output Summary (Last 24 hours) at 07/15/2021 0825 ?Last data filed at 07/15/2021 0500 ?Gross per 24 hour  ?Intake 1245 ml  ?Output --  ?Net 1245 ml  ? ?Weight change:  ?Exam: ? ?General:  Pt is alert, follows commands appropriately, not in acute distress ?HEENT: No icterus, No thrush, No neck mass, [place]/AT ?Cardiovascular: RRR, S1/S2, no rubs, no gallops ?Respiratory: CTA bilaterally, no wheezing, no crackles, no rhonchi ?  Abdomen: Soft/+BS, LLQ/Lflank tender, non distended, no guarding ?Extremities: No edema, No lymphangitis, No petechiae, No rashes, no synovitis ? ? ?Data Reviewed: ?I have personally reviewed following labs and imaging studies ?Basic Metabolic Panel: ?Recent Labs  ?Lab 07/13/21 ?1659 07/14/21 ?1048  07/15/21 ?1610  ?NA 136 136 140  ?K 4.5 5.1 5.2*  ?CL 103 102 112*  ?CO2 23 22 19*  ?GLUCOSE 228* 202* 116*  ?BUN 62* 79* 73*  ?CREATININE 2.85* 3.61* 2.75*  ?CALCIUM 9.0 8.8* 8.7*  ?MG  --  2.0  --   ?PHOS  --  5.9*  --   ? ?Liver Function Tests: ?Recent Labs  ?Lab 07/14/21 ?1048  ?AST 25  ?ALT 25  ?ALKPHOS 87  ?BILITOT 0.4  ?PROT 7.1  ?ALBUMIN 3.6  ? ?No results for input(s): LIPASE, AMYLASE in the last 168 hours. ?No results for input(s): AMMONIA in the last 168 hours. ?Coagulation Profile: ?No results for input(s): INR, PROTIME in the last 168 hours. ?CBC: ?Recent Labs  ?Lab 07/13/21 ?1659 07/14/21 ?1048 07/15/21 ?9604  ?WBC 13.7* 13.0* 10.6*  ?NEUTROABS 9.8* 9.6*  --   ?HGB 11.8* 11.4* 11.0*  ?HCT 36.8 36.1 35.6*  ?MCV 92.7 93.3 95.2  ?PLT 250 263 236  ? ?Cardiac Enzymes: ?No results for input(s): CKTOTAL, CKMB, CKMBINDEX, TROPONINI in the last 168 hours. ?BNP: ?Invalid input(s): POCBNP ?CBG: ?Recent Labs  ?Lab 07/14/21 ?1627 07/14/21 ?2103 07/15/21 ?5409  ?GLUCAP 98 148* 101*  ? ?HbA1C: ?No results for input(s): HGBA1C in the last 72 hours. ?Urine analysis: ?   ?Component Value Date/Time  ? [place] 07/13/2021 1207  ? APPEARANCEUR CLEAR 07/13/2021 1207  ? LABSPEC 1.015 07/13/2021 1207  ? PHURINE 5.0 07/13/2021 1207  ? GLUCOSEU 50 (A) 07/13/2021 1207  ? HGBUR SMALL (A) 07/13/2021 1207  ? [place] NEGATIVE 07/13/2021 1207  ? [place] NEGATIVE 07/13/2021 1207  ? PROTEINUR 100 (A) 07/13/2021 1207  ? UROBILINOGEN 0.2 11/28/2014 1308  ? NITRITE NEGATIVE 07/13/2021 1207  ? LEUKOCYTESUR NEGATIVE 07/13/2021 1207  ? ?Sepsis Labs: ?'@LABRCNTIP'$ (procalcitonin:4,lacticidven:4) ?) ?Recent Results (from the past 240 hour(s))  ?Resp Panel by RT-PCR (Flu A&B, Covid) Nasopharyngeal Swab     Status: None  ? Collection Time: 07/14/21 12:25 PM  ? Specimen: Nasopharyngeal Swab; Nasopharyngeal(NP) swabs in vial transport medium  ?Result Value Ref Range Status  ? SARS Coronavirus 2 by RT PCR NEGATIVE NEGATIVE Final  ?   Comment: (NOTE) ?SARS-CoV-2 target nucleic acids are NOT DETECTED. ? ?The SARS-CoV-2 RNA is generally detectable in upper respiratory ?specimens during the acute phase of infection. The lowest ?concentration of SARS-CoV-2 viral copies this assay can detect is ?138 copies/mL. A negative result does not preclude SARS-Cov-2 ?infection and should not be used as the sole basis for treatment or ?other patient management decisions. A negative result may occur with  ?improper specimen collection/handling, submission of specimen other ?than nasopharyngeal swab, presence of viral mutation(s) within the ?areas targeted by this assay, and inadequate number of viral ?copies(<138 copies/mL). A negative result must be combined with ?clinical observations, patient history, and epidemiological ?information. The expected result is Negative. ? ?Fact Sheet for Patients:  ?EntrepreneurPulse.com.au ? ?Fact Sheet for Healthcare Providers:  ?IncredibleEmployment.be ? ?This test is no t yet approved or cleared by the [place] FDA and  ?has been authorized for detection and/or diagnosis of SARS-CoV-2 by ?FDA under an Emergency Use Authorization (EUA). This EUA will remain  ?in effect (meaning this test can be used) for the duration of the ?COVID-19 declaration  under Section 564(b)(1) of the Act, 21 ?U.S.C.section 360bbb-3(b)(1), unless the authorization is terminated  ?or revoked sooner.  ? ? ?  ? Influenza A by PCR NEGATIVE NEGATIVE Final  ? Influenza B by PCR NEGATIVE NEGATIVE Final  ?  Comment: (NOTE) ?The Xpert Xpress SARS-CoV-2/FLU/RSV plus assay is intended as an aid ?in the diagnosis of influenza from Nasopharyngeal swab specimens and ?should not be used as a sole basis for treatment. Nasal washings and ?aspirates are unacceptable for Xpert Xpress SARS-CoV-2/FLU/RSV ?testing. ? ?Fact Sheet for Patients: ?EntrepreneurPulse.com.au ? ?Fact Sheet for Healthcare  Providers: ?IncredibleEmployment.be ? ?This test is not yet approved or cleared by the [place] FDA and ?has been authorized for detection and/or diagnosis of SARS-CoV-2 by ?FDA under an Emergency Use Autho

## 2021-07-15 NOTE — Transfer of Care (Signed)
Immediate Anesthesia Transfer of Care Note ? ?Patient: Grace Hess ? ?Procedure(s) Performed: CYSTOSCOPY WITH RETROGRADE PYELOGRAM (Bilateral: Ureter) ?CYSTOSCOPY WITH STENT PLACEMENT (Left: Ureter) ? ?Patient Location: PACU ? ?Anesthesia Type:General ? ?Level of Consciousness: awake ? ?Airway & Oxygen Therapy: Patient Spontanous Breathing ? ?Post-op Assessment: Report given to RN ? ?Post vital signs: Reviewed and stable ? ?Last Vitals:  ?Vitals Value Taken Time  ?BP    ?Temp 36.4 ?C 07/15/21 1453  ?Pulse 74 07/15/21 1454  ?Resp 14 07/15/21 1454  ?SpO2 89 % 07/15/21 1454  ?Vitals shown include unvalidated device data. ? ?Last Pain:  ?Vitals:  ? 07/15/21 1130  ?TempSrc:   ?PainSc: 5   ?   ? ?  ? ?Complications: No notable events documented. ?

## 2021-07-16 DIAGNOSIS — N2889 Other specified disorders of kidney and ureter: Secondary | ICD-10-CM | POA: Diagnosis not present

## 2021-07-16 DIAGNOSIS — E1165 Type 2 diabetes mellitus with hyperglycemia: Secondary | ICD-10-CM

## 2021-07-16 DIAGNOSIS — N179 Acute kidney failure, unspecified: Secondary | ICD-10-CM | POA: Diagnosis not present

## 2021-07-16 DIAGNOSIS — N1330 Unspecified hydronephrosis: Secondary | ICD-10-CM | POA: Diagnosis not present

## 2021-07-16 DIAGNOSIS — N1832 Chronic kidney disease, stage 3b: Secondary | ICD-10-CM | POA: Diagnosis not present

## 2021-07-16 LAB — GLUCOSE, CAPILLARY
Glucose-Capillary: 114 mg/dL — ABNORMAL HIGH (ref 70–99)
Glucose-Capillary: 181 mg/dL — ABNORMAL HIGH (ref 70–99)
Glucose-Capillary: 117 mg/dL — ABNORMAL HIGH (ref 70–99)
Glucose-Capillary: 120 mg/dL — ABNORMAL HIGH (ref 70–99)

## 2021-07-16 LAB — BASIC METABOLIC PANEL
Anion gap: 8 (ref 5–15)
BUN: 53 mg/dL — ABNORMAL HIGH (ref 8–23)
CO2: 21 mmol/L — ABNORMAL LOW (ref 22–32)
Calcium: 8.7 mg/dL — ABNORMAL LOW (ref 8.9–10.3)
Chloride: 112 mmol/L — ABNORMAL HIGH (ref 98–111)
Creatinine, Ser: 2.25 mg/dL — ABNORMAL HIGH (ref 0.44–1.00)
GFR, Estimated: 23 mL/min — ABNORMAL LOW (ref >60)
Glucose, Bld: 93 mg/dL (ref 70–99)
Potassium: 4.7 mmol/L (ref 3.5–5.1)
Sodium: 141 mmol/L (ref 135–145)

## 2021-07-16 MED ORDER — SODIUM CHLORIDE 0.9 % IV SOLN: INTRAVENOUS | Status: DC

## 2021-07-16 NOTE — Assessment & Plan Note (Signed)
will need outpatient follow-up and MRI ?Appreciate Dr. Felipa Eth follow-up ?

## 2021-07-16 NOTE — Anesthesia Postprocedure Evaluation (Signed)
Anesthesia Post Note ? ?Patient: Grace Hess ? ?Procedure(s) Performed: CYSTOSCOPY WITH RETROGRADE PYELOGRAM (Bilateral: Ureter) ?CYSTOSCOPY WITH STENT PLACEMENT (Left: Ureter) ? ?Patient location during evaluation: Phase II ?Anesthesia Type: General ?Level of consciousness: awake ?Pain management: pain level controlled ?Vital Signs Assessment: post-procedure vital signs reviewed and stable ?Respiratory status: spontaneous breathing and respiratory function stable ?Cardiovascular status: blood pressure returned to baseline and stable ?Postop Assessment: no headache and no apparent nausea or vomiting ?Anesthetic complications: no ?Comments: Late entry ? ? ?No notable events documented. ? ? ?Last Vitals:  ?Vitals:  ? 07/15/21 2113 07/16/21 0541  ?BP: (!) 142/65 (!) 175/61  ?Pulse: 85 71  ?Resp: 18 17  ?Temp: 36.4 ?C 36.5 ?C  ?SpO2: 92% 95%  ?  ?Last Pain:  ?Vitals:  ? 07/16/21 0541  ?TempSrc: Oral  ?PainSc:   ? ? ?  ?  ?  ?  ?  ?  ? ?Louann Sjogren ? ? ? ? ?

## 2021-07-16 NOTE — Progress Notes (Signed)
Patient has been stable this shift.  Has only has pain medication once this shift.  Vitals stable.  Patient reports pink tinged urine after procedure. ?

## 2021-07-16 NOTE — Progress Notes (Signed)
Urology Inpatient Progress Note ? ?Subjective: ?She is doing well this AM. Minimal stent related symptoms.  No flank pain. ?Cr improving. ? ?Anti-infectives: ?Anti-infectives (From admission, onward)  ? ? Start     Dose/Rate Route Frequency Ordered Stop  ? 07/15/21 1421  ceFAZolin (ANCEF) 2-4 GM/100ML-% IVPB       ?Note to Pharmacy: Orlie Dakin: cabinet override  ?    07/15/21 1421 07/16/21 0229  ? ?  ? ? ?Current Facility-Administered Medications  ?Medication Dose Route Frequency Provider Last Rate Last Admin  ? 0.9 %  sodium chloride infusion   Intravenous Continuous Daved Mcfann, Reece Leader., MD 75 mL/hr at 07/15/21 1700 Restarted at 07/15/21 1700  ? acetaminophen (TYLENOL) tablet 650 mg  650 mg Oral Q6H PRN Tabitha Riggins, Reece Leader., MD      ? Or  ? acetaminophen (TYLENOL) suppository 650 mg  650 mg Rectal Q6H PRN Deloise Marchant, Reece Leader., MD      ? albuterol (PROVENTIL) (2.5 MG/3ML) 0.083% nebulizer solution 2.5 mg  2.5 mg Inhalation Q4H PRN Carmisha Larusso, Reece Leader., MD      ? ALPRAZolam Duanne Moron) tablet 0.5 mg  0.5 mg Oral Q12H PRN Gorgeous Newlun, Reece Leader., MD      ? aspirin EC tablet 81 mg  81 mg Oral Daily Ziah Leandro, Reece Leader., MD      ? Chlorhexidine Gluconate Cloth 2 % PADS 6 each  6 each Topical Daily Merrianne Mccumbers, Reece Leader., MD   6 each at 07/15/21 [phone]  ? escitalopram (LEXAPRO) tablet 20 mg  20 mg Oral Daily Primus Bravo., MD   20 mg at 07/15/21 9604  ? fentaNYL (SUBLIMAZE) injection 50 mcg  50 mcg Intravenous Q4H PRN Primus Bravo., MD   50 mcg at 07/16/21 0441  ? gabapentin (NEURONTIN) capsule 300 mg  300 mg Oral QHS Primus Bravo., MD   300 mg at 07/15/21 2224  ? heparin injection 5,000 Units  5,000 Units Subcutaneous Q8H Geraldyn Shain, Reece Leader., MD   5,000 Units at 07/16/21 [phone]  ? insulin aspart (novoLOG) injection 0-5 Units  0-5 Units Subcutaneous QHS Beecher Furio, Reece Leader., MD      ? insulin aspart (novoLOG) injection 0-9 Units  0-9 Units Subcutaneous TID WC Kamel Haven, Reece Leader., MD      ?  insulin glargine-yfgn [facility]) injection 5 Units  5 Units Subcutaneous QHS Primus Bravo., MD   5 Units at 07/15/21 2225  ? metoprolol tartrate (LOPRESSOR) tablet 12.5 mg  12.5 mg Oral Daily Primus Bravo., MD   12.5 mg at 07/15/21 8119  ? mupirocin ointment (BACTROBAN) 2 % 1 application.  1 application. Nasal BID Anetta Olvera, Reece Leader., MD   1 application. at 07/15/21 1109  ? mupirocin ointment (BACTROBAN) 2 % 1 application.  1 application. Nasal BID Lakima Dona, Reece Leader., MD   1 application. at 07/15/21 2225  ? nicotine (NICODERM CQ - dosed in mg/24 hours) patch 21 mg  21 mg Transdermal Daily Tat, Shanon Brow, MD   21 mg at 07/15/21 1718  ? ondansetron (ZOFRAN) tablet 4 mg  4 mg Oral Q6H PRN Nara Paternoster, Reece Leader., MD      ? Or  ? ondansetron (ZOFRAN) injection 4 mg  4 mg Intravenous Q6H PRN Primus Bravo., MD   4 mg at 07/14/21 1326  ? pantoprazole (PROTONIX) EC tablet 40 mg  40 mg Oral QAC breakfast Primus Bravo., MD   40 mg at 07/15/21 1478  ? phenazopyridine (PYRIDIUM) tablet 200 mg  200 mg  Oral TID PRN Primus Bravo., MD      ? rosuvastatin (CRESTOR) tablet 20 mg  20 mg Oral Daily Primus Bravo., MD   20 mg at 07/15/21 9528  ? ? ? ?Objective: ?Vital signs in last 24 hours: ?Temp:  [97.5 ?F (36.4 ?C)-97.9 ?F (36.6 ?C)] 97.7 ?F (36.5 ?C) (05/04 0541) ?Pulse Rate:  [64-85] 71 (05/04 0541) ?Resp:  [10-19] 17 (05/04 0541) ?BP: (142-175)/(53-71) 175/61 (05/04 0541) ?SpO2:  [90 %-97 %] 95 % (05/04 0541) ?Weight:  [66.4 kg] 66.4 kg (05/04 0541) ? ?Intake/Output from previous day: ?05/03 0701 - 05/04 0700 ?In: 1824.8 [P.O.:960; I.V.:864.8] ?Out: 11 [Urine:1; Blood:10] ?Intake/Output this shift: ?No intake/output data recorded. ? ?GENERAL APPEARANCE:  Well appearing, well developed, well nourished, NAD ?HEENT:  Atraumatic, normocephalic, oropharynx clear ?NECK:  Supple without lymphadenopathy or thyromegaly ?ABDOMEN:  Soft, non-tender, no masses ?EXTREMITIES:  Moves all extremities  well, without clubbing, cyanosis, or edema ?NEUROLOGIC:  Alert and oriented x 3,  CN II-XII grossly intact ?MENTAL STATUS:  appropriate ?BACK:  Non-tender to palpation, No CVAT ?SKIN:  Warm, dry, and intact ? ? ?Lab Results:  ?Recent Labs  ?  07/14/21 ?1048 07/15/21 ?4132  ?WBC 13.0* 10.6*  ?HGB 11.4* 11.0*  ?HCT 36.1 35.6*  ?PLT 263 236  ? ?BMET ?Recent Labs  ?  07/15/21 ?0608 07/16/21 ?4401  ?NA 140 141  ?K 5.2* 4.7  ?CL 112* 112*  ?CO2 19* 21*  ?GLUCOSE 116* 93  ?BUN 73* 53*  ?CREATININE 2.75* 2.25*  ?CALCIUM 8.7* 8.7*  ? ?PT/INR ?No results for input(s): LABPROT, INR in the last 72 hours. ?ABG ?No results for input(s): PHART, HCO3 in the last 72 hours. ? ?Invalid input(s): PCO2, PO2 ? ?Studies/Results: ?DG C-Arm 1-60 Min-No Report ? ?Result Date: 07/15/2021 ?Fluoroscopy was utilized by the requesting physician.  No radiographic interpretation.   ? ? ?Assessment & Plan: ?Left hydronephrosis - secondary to left renal mass ?Left renal mass ?Acute on chronic renal failure ? ?POD #1 s/p left ureteral stent insertion. ?Doing well. ?Continue ureteral stent ?Disposition per Medicine ?Will need outpatient follow-up for left renal mass  ? ? ?Michaelle Birks, MD ?07/16/2021 ? ?

## 2021-07-16 NOTE — Progress Notes (Signed)
?  ?       ?PROGRESS NOTE ? ?Grace Hess PZW:[id number] DOB: 04/30/1955 DOA: 07/14/2021 ?PCP: Redmond School, MD ? ?Brief History:  ?66 year old female with a history of diabetes mellitus type 2, hypertension, hyperlipidemia, TIA, CKD stage III, chronic back pain, coronary disease with stent 2008, tobacco abuse presenting with left flank pain of 4 days duration.  The patient was in the ED on 07/13/2021 with the same complaints.  CT of the abdomen with renal protocol at that time showed a 5.4 cm left renal mass with mass effect on the left renal pelvis and proximal left ureter.  The patient left AMA secondary to some social circumstances.  She returns with continued left flank pain on 07/14/2021.  She has had some subjective fevers and chills.  She has had some nausea without emesis.  She denies any headache, chest pain, shortness of breath, hematuria, hematochezia, melena.  She has had some loose stools. ?In the ED, the patient was afebrile and hemodynamically stable.  WBC 13.0, hemoglobin 11.8, platelets 250,000.  Sodium 136, potassium 5.1, bicarbonate 22, serum creatinine 3.61.  Urology was consulted and plans for left ureteral stent on 07/15/2021.  ? ?Assessment/Plan: ? ? ?Principal Problem: ?  Acute renal failure superimposed on stage 3b chronic kidney disease ([place]) ?Active Problems: ?  HYPERCHOLESTEROLEMIA  IIA ?  Depression with anxiety ?  Essential hypertension, benign ?  GERD (gastroesophageal reflux disease) ?  CAD S/P percutaneous coronary angioplasty ?  Uncontrolled type 2 diabetes mellitus with hyperglycemia, without long-term current use of insulin ([place]) ?  Left renal mass ? ?Assessment and Plan: ?* Acute renal failure superimposed on stage 3b chronic kidney disease ([place]) ?-Baseline creatinine 1.5-1.7 ?-Patient presented with serum creatinine 3.61 ?-due to progression of renal dysfunction in the setting of diabetes and hypertension the presence of renal mass with ongoing obstruction and  hydronephrosis. ?-Gentle IV fluids>>continue ?-Appreciate urology service for intervention ?-5/3>>left ureteral stent place ? ?Left renal mass ?will need outpatient follow-up and MRI ?Appreciate Dr. Felipa Eth follow-up ? ?Uncontrolled type 2 diabetes mellitus with hyperglycemia, without long-term current use of insulin ([place]) ?Holding glipizide and Ozempic temporarily ?NovoLog sliding scale ?07/01/2021 hemoglobin A1c 8.1 ? ?CAD S/P percutaneous coronary angioplasty ?-No chest pain or shortness of breath currently ?-Continue the use of statins and low-dose beta-blocker. ?-Holding Plavix in anticipation for surgical intervention. ? ?GERD (gastroesophageal reflux disease) ?- Continue PPI. ? ?Essential hypertension, benign ?-Holding lisinopril due to AKI ?-Continue home dose of metoprolol ? ?Depression with anxiety ?- Stable mood currently ?-No suicidal ideation or hallucination  ?-resume home anxiolytics and depressant agents. ? ?HYPERCHOLESTEROLEMIA  IIA ?- Continue the use of statins. ? ? ? ?Family Communication:   spouse updated at bedside 5/4 ?  ?Consultants:  urology ?  ?Code Status:  FULL ?  ?DVT Prophylaxis:  [place] Heparin ?  ?  ?Procedures: ?As Listed in Progress Note Above ?  ?Antibiotics: ?None ?  ? ? ? ? ?Subjective: ?Patient denies fevers, chills, headache, chest pain, dyspnea, nausea, vomiting, diarrhea, abdominal pain, dysuria, hematuria, hematochezia, and melena. ? ? ?Objective: ?Vitals:  ? 07/15/21 1515 07/15/21 1537 07/15/21 2113 07/16/21 0541  ?BP: (!) 143/53 (!) 152/61 (!) 142/65 (!) 175/61  ?Pulse: 76 82 85 71  ?Resp: '15 18 18 17  '$ ?Temp:  (!) 97.5 ?F (36.4 ?C) 97.6 ?F (36.4 ?C) 97.7 ?F (36.5 ?C)  ?TempSrc:  Oral Oral Oral  ?SpO2: 97% 91% 92% 95%  ?Weight:    66.4 kg  ?Height:      ? ? ?  Intake/Output Summary (Last 24 hours) at 07/16/2021 1205 ?Last data filed at 07/16/2021 8527 ?Gross per 24 hour  ?Intake 1638.21 ml  ?Output 11 ml  ?Net 1627.21 ml  ? ?Weight change: -0.279 kg ?Exam: ? ?General:  Pt is alert,  follows commands appropriately, not in acute distress ?HEENT: No icterus, No thrush, No neck mass, [place]/AT ?Cardiovascular: RRR, S1/S2, no rubs, no gallops ?Respiratory: CTA bilaterally, no wheezing, no crackles, no rhonchi ?Abdomen: Soft/+BS, non tender, non distended, no guarding ?Extremities: No edema, No lymphangitis, No petechiae, No rashes, no synovitis ? ? ?Data Reviewed: ?I have personally reviewed following labs and imaging studies ?Basic Metabolic Panel: ?Recent Labs  ?Lab 07/13/21 ?1659 07/14/21 ?1048 07/15/21 ?7824 07/16/21 ?2353  ?NA 136 136 140 141  ?K 4.5 5.1 5.2* 4.7  ?CL 103 102 112* 112*  ?CO2 23 22 19* 21*  ?GLUCOSE 228* 202* 116* 93  ?BUN 62* 79* 73* 53*  ?CREATININE 2.85* 3.61* 2.75* 2.25*  ?CALCIUM 9.0 8.8* 8.7* 8.7*  ?MG  --  2.0  --   --   ?PHOS  --  5.9*  --   --   ? ?Liver Function Tests: ?Recent Labs  ?Lab 07/14/21 ?1048  ?AST 25  ?ALT 25  ?ALKPHOS 87  ?BILITOT 0.4  ?PROT 7.1  ?ALBUMIN 3.6  ? ?No results for input(s): LIPASE, AMYLASE in the last 168 hours. ?No results for input(s): AMMONIA in the last 168 hours. ?Coagulation Profile: ?No results for input(s): INR, PROTIME in the last 168 hours. ?CBC: ?Recent Labs  ?Lab 07/13/21 ?1659 07/14/21 ?1048 07/15/21 ?6144  ?WBC 13.7* 13.0* 10.6*  ?NEUTROABS 9.8* 9.6*  --   ?HGB 11.8* 11.4* 11.0*  ?HCT 36.8 36.1 35.6*  ?MCV 92.7 93.3 95.2  ?PLT 250 263 236  ? ?Cardiac Enzymes: ?No results for input(s): CKTOTAL, CKMB, CKMBINDEX, TROPONINI in the last 168 hours. ?BNP: ?Invalid input(s): POCBNP ?CBG: ?Recent Labs  ?Lab 07/15/21 ?1507 07/15/21 ?1633 07/15/21 ?2216 07/16/21 ?3154 07/16/21 ?1100  ?GLUCAP 86 102* 140* 114* 181*  ? ?HbA1C: ?No results for input(s): HGBA1C in the last 72 hours. ?Urine analysis: ?   ?Component Value Date/Time  ? [place] 07/13/2021 1207  ? APPEARANCEUR CLEAR 07/13/2021 1207  ? LABSPEC 1.015 07/13/2021 1207  ? PHURINE 5.0 07/13/2021 1207  ? GLUCOSEU 50 (A) 07/13/2021 1207  ? HGBUR SMALL (A) 07/13/2021 1207  ?  [place] NEGATIVE 07/13/2021 1207  ? [place] NEGATIVE 07/13/2021 1207  ? PROTEINUR 100 (A) 07/13/2021 1207  ? UROBILINOGEN 0.2 11/28/2014 1308  ? NITRITE NEGATIVE 07/13/2021 1207  ? LEUKOCYTESUR NEGATIVE 07/13/2021 1207  ? ?Sepsis Labs: ?'@LABRCNTIP'$ (procalcitonin:4,lacticidven:4) ?) ?Recent Results (from the past 240 hour(s))  ?Resp Panel by RT-PCR (Flu A&B, Covid) Nasopharyngeal Swab     Status: None  ? Collection Time: 07/14/21 12:25 PM  ? Specimen: Nasopharyngeal Swab; Nasopharyngeal(NP) swabs in vial transport medium  ?Result Value Ref Range Status  ? SARS Coronavirus 2 by RT PCR NEGATIVE NEGATIVE Final  ?  Comment: (NOTE) ?SARS-CoV-2 target nucleic acids are NOT DETECTED. ? ?The SARS-CoV-2 RNA is generally detectable in upper respiratory ?specimens during the acute phase of infection. The lowest ?concentration of SARS-CoV-2 viral copies this assay can detect is ?138 copies/mL. A negative result does not preclude SARS-Cov-2 ?infection and should not be used as the sole basis for treatment or ?other patient management decisions. A negative result may occur with  ?improper specimen collection/handling, submission of specimen other ?than nasopharyngeal swab, presence of viral mutation(s) within the ?areas targeted by  this assay, and inadequate number of viral ?copies(<138 copies/mL). A negative result must be combined with ?clinical observations, patient history, and epidemiological ?information. The expected result is Negative. ? ?Fact Sheet for Patients:  ?EntrepreneurPulse.com.au ? ?Fact Sheet for Healthcare Providers:  ?IncredibleEmployment.be ? ?This test is no t yet approved or cleared by the [place] FDA and  ?has been authorized for detection and/or diagnosis of SARS-CoV-2 by ?FDA under an Emergency Use Authorization (EUA). This EUA will remain  ?in effect (meaning this test can be used) for the duration of the ?COVID-19 declaration under Section 564(b)(1) of the  Act, 21 ?U.S.C.section 360bbb-3(b)(1), unless the authorization is terminated  ?or revoked sooner.  ? ? ?  ? Influenza A by PCR NEGATIVE NEGATIVE Final  ? Influenza B by PCR NEGATIVE NEGATIVE Final  ?  Comment: (NOTE) ?The Xpert X

## 2021-07-17 ENCOUNTER — Encounter (HOSPITAL_COMMUNITY): Payer: Self-pay | Admitting: Urology

## 2021-07-17 LAB — BASIC METABOLIC PANEL
Anion gap: 9 (ref 5–15)
BUN: 35 mg/dL — ABNORMAL HIGH (ref 8–23)
CO2: 21 mmol/L — ABNORMAL LOW (ref 22–32)
Calcium: 9 mg/dL (ref 8.9–10.3)
Chloride: 111 mmol/L (ref 98–111)
Creatinine, Ser: 1.73 mg/dL — ABNORMAL HIGH (ref 0.44–1.00)
GFR, Estimated: 32 mL/min — ABNORMAL LOW (ref >60)
Glucose, Bld: 104 mg/dL — ABNORMAL HIGH (ref 70–99)
Potassium: 4.2 mmol/L (ref 3.5–5.1)
Sodium: 141 mmol/L (ref 135–145)

## 2021-07-17 LAB — GLUCOSE, CAPILLARY: Glucose-Capillary: 121 mg/dL — ABNORMAL HIGH (ref 70–99)

## 2021-07-17 MED ORDER — AMLODIPINE BESYLATE 5 MG PO TABS: 5.0000 mg | ORAL_TABLET | Freq: Every day | ORAL | 1 refills | Status: DC

## 2021-07-17 MED ORDER — CLOPIDOGREL BISULFATE 75 MG PO TABS
75.0000 mg | ORAL_TABLET | Freq: Every day | ORAL | Status: DC
  Administered 2021-07-17: 75 mg via ORAL
  Filled 2021-07-17: qty 1

## 2021-07-17 MED ORDER — AMLODIPINE BESYLATE 5 MG PO TABS
5.0000 mg | ORAL_TABLET | Freq: Every day | ORAL | Status: DC
  Administered 2021-07-17: 5 mg via ORAL
  Filled 2021-07-17: qty 1

## 2021-07-17 NOTE — Discharge Summary (Signed)
?Physician Discharge Summary ?  ?Patient: Grace Hess MRN: [id number] DOB: 1956-03-12  ?Admit date:     07/14/2021  ?Discharge date: 07/17/21  ?Discharge Physician: Shanon Brow Lorin Gawron  ? ?PCP: Redmond School, MD  ? ?Recommendations at discharge:  ? Please follow up with primary care provider within 1-2 weeks ? Please repeat BMP and CBC in one week ? Please follow up on/with Dr. Michaelle Birks ? ? ? ?Hospital Course: ?66 year old female with a history of diabetes mellitus type 2, hypertension, hyperlipidemia, TIA, CKD stage III, chronic back pain, coronary disease with stent 2008, tobacco abuse presenting with left flank pain of 4 days duration.  The patient was in the ED on 07/13/2021 with the same complaints.  CT of the abdomen with renal protocol at that time showed a 5.4 cm left renal mass with mass effect on the left renal pelvis and proximal left ureter.  The patient left AMA secondary to some social circumstances.  She returns with continued left flank pain on 07/14/2021.  She has had some subjective fevers and chills.  She has had some nausea without emesis.  She denies any headache, chest pain, shortness of breath, hematuria, hematochezia, melena.  She has had some loose stools. ?In the ED, the patient was afebrile and hemodynamically stable.  WBC 13.0, hemoglobin 11.8, platelets 250,000.  Sodium 136, potassium 5.1, bicarbonate 22, serum creatinine 3.61.  Urology was consulted and plans for left ureteral stent on 07/15/2021. ? ?Assessment and Plan: ?* Acute renal failure superimposed on stage 3b chronic kidney disease ([place]) ?-Baseline creatinine 1.5-1.7 ?-Patient presented with serum creatinine 3.61 ?-due to progression of renal dysfunction in the setting of diabetes and hypertension the presence of renal mass with ongoing obstruction and hydronephrosis. ?-Gentle IV fluids>>continue ?-Appreciate urology service for intervention ?-5/3>>left ureteral stent place ?Serum creatinine 1.73 on day of dc ? ?Left renal  mass ?will need outpatient follow-up and MRI ?Appreciate Dr. Felipa Eth follow-up ? ?Uncontrolled type 2 diabetes mellitus with hyperglycemia, without long-term current use of insulin ([place]) ?Holding glipizide and Ozempic temporarily>>resume after d/c ?NovoLog sliding scale ?07/01/2021 hemoglobin A1c 8.1 ? ?CAD S/P percutaneous coronary angioplasty ?-No chest pain or shortness of breath currently ?-Continue the use of statins and low-dose beta-blocker. ?-Holding Plavix in anticipation for surgical intervention>>resume after surgery ? ?GERD (gastroesophageal reflux disease) ?- Continue PPI. ? ?Essential hypertension, benign ?-Holding lisinopril due to AKI>>will not restart ?-Continue home dose of metoprolol ?-add amlodipine in lieu of lisinopril ? ?Depression with anxiety ?- Stable mood currently ?-No suicidal ideation or hallucination  ?-resume home anxiolytics and depressant agents. ? ?HYPERCHOLESTEROLEMIA  IIA ?- Continue the use of statins. ? ? ? ? ?  ? ? ?Consultants: urology ?Procedures performed: left ureteral stent 5/3  ?Disposition: Home ?Diet recommendation:  ?Carb modified diet ?DISCHARGE MEDICATION: ?Allergies as of 07/17/2021   ? ?   Reactions  ? Codeine Shortness Of Breath  ? Latex Itching  ? Sulfa Antibiotics Other (See Comments)  ? Reaction:  Unknown   ? ?  ? ?  ?Medication List  ?  ? ?STOP taking these medications   ? ?lisinopril 20 MG tablet ?Commonly known as: ZESTRIL ?  ?nitroGLYCERIN 0.4 MG SL tablet ?Commonly known as: NITROSTAT ?  ? ?  ? ?TAKE these medications   ? ?Accu-Chek Guide test strip ?Generic drug: glucose blood ?Use as instructed ?  ?Accu-Chek Guide w/Device Kit ?1 Piece by Does not apply route as directed. ?  ?acetaminophen 325 MG tablet ?Commonly known as: TYLENOL ?Take  2 tablets (650 mg total) by mouth every 4 (four) hours as needed for headache or mild pain. ?  ?albuterol 108 (90 Base) MCG/ACT inhaler ?Commonly known as: VENTOLIN HFA ?Inhale 1-2 puffs into the lungs every 4 (four)  hours as needed for shortness of breath or wheezing. ?  ?ALPRAZolam 0.5 MG tablet ?Commonly known as: Duanne Moron ?Take 0.5 mg by mouth 2 (two) times daily as needed. ?  ?amLODipine 5 MG tablet ?Commonly known as: NORVASC ?Take 1 tablet (5 mg total) by mouth daily. ?  ?aspirin 81 MG EC tablet ?Take by mouth. ?  ?benzonatate 100 MG capsule ?Commonly known as: TESSALON ?Take 100 mg by mouth 4 (four) times daily as needed. ?  ?Biotin 5000 MCG Caps ?Take 1 capsule by mouth daily. ?  ?clopidogrel 75 MG tablet ?Commonly known as: PLAVIX ?Take 1 tablet (75 mg total) by mouth daily. ?  ?escitalopram 20 MG tablet ?Commonly known as: LEXAPRO ?Take 20 mg by mouth daily. ?  ?gabapentin 300 MG capsule ?Commonly known as: NEURONTIN ?Take 300 mg by mouth at bedtime. ?  ?glipiZIDE 2.5 MG 24 hr tablet ?Commonly known as: GLUCOTROL XL ?TAKE 1 TABLET(2.5 MG) BY MOUTH DAILY WITH BREAKFAST ?What changed: See the new instructions. ?  ?metFORMIN 500 MG tablet ?Commonly known as: GLUCOPHAGE ?Take 500 mg by mouth 2 (two) times daily with a meal. ?  ?metoprolol tartrate 25 MG tablet ?Commonly known as: LOPRESSOR ?Take 1 tablet (25 mg total) by mouth 2 (two) times daily. ?  ?nystatin cream ?Commonly known as: MYCOSTATIN ?Apply 1 application topically 2 (two) times daily as needed. ?  ?ondansetron 4 MG tablet ?Commonly known as: ZOFRAN ?TAKE 1 TABLET(4 MG) BY MOUTH TWICE DAILY AS NEEDED FOR NAUSEA OR VOMITING ?  ?oxyCODONE-acetaminophen 5-325 MG tablet ?Commonly known as: PERCOCET/ROXICET ?Take 1 tablet by mouth 3 (three) times daily as needed. ?  ?Ozempic (0.25 or 0.5 MG/DOSE) 2 MG/1.5ML Sopn ?Generic drug: Semaglutide(0.25 or 0.5MG/DOS) ?INJECT 0.5MG INTO THE SKIN ONCE WEEKLY ?What changed: See the new instructions. ?  ?pantoprazole 40 MG tablet ?Commonly known as: PROTONIX ?Take 1 tablet (40 mg total) by mouth daily before breakfast. ?  ?rosuvastatin 20 MG tablet ?Commonly known as: CRESTOR ?TAKE 1 TABLET(20 MG) BY MOUTH DAILY ?What changed: See  the new instructions. ?  ?Vitamin D3 125 MCG (5000 UT) Caps ?Take 1 capsule by mouth once a week. Tuesday ?  ? ?  ? ? Follow-up Information   ? ? Stoneking, Reece Leader., MD Follow up in 1 week(s).   ?Specialty: Urology ?Contact information: ?[address] ?[place] ?[place] [address] ?[phone] ? ? ?  ?  ? ?  ?  ? ?  ? ?Discharge Exam: ?Filed Weights  ? 07/15/21 0530 07/16/21 0541 07/17/21 0500  ?Weight: 67.4 kg 66.4 kg 65.3 kg  ? ?HEENT:  [place]/AT, No thrush, no icterus ?CV:  RRR, no rub, no S3, no S4 ?Lung:  CTA, no wheeze, no rhonchi ?Abd:  soft/+BS, NT ?Ext:  No edema, no lymphangitis, no synovitis, no rash ? ? ?Condition at discharge: stable ? ?The results of significant diagnostics from this hospitalization (including imaging, microbiology, ancillary and laboratory) are listed below for reference.  ? ?Imaging Studies: ?DG C-Arm 1-60 Min-No Report ? ?Result Date: 07/15/2021 ?Fluoroscopy was utilized by the requesting physician.  No radiographic interpretation.  ? ?CT Renal Stone Study ? ?Result Date: 07/13/2021 ?CLINICAL DATA:  Left-sided abdominal pain and groin pain with nausea x3 days. EXAM: CT ABDOMEN AND  PELVIS WITHOUT CONTRAST TECHNIQUE: Multidetector CT imaging of the abdomen and pelvis was performed following the standard protocol without IV contrast. RADIATION DOSE REDUCTION: This exam was performed according to the departmental dose-optimization program which includes automated exposure control, adjustment of the mA and/or kV according to patient size and/or use of iterative reconstruction technique. COMPARISON:  April 01, 2021 and October 22, 2013 FINDINGS: Lower chest: No acute abnormality. Hepatobiliary: A punctate calcified granuloma is seen within the anteromedial aspect of the right lobe of the liver. Status post cholecystectomy. No biliary dilatation. Pancreas: Unremarkable. No pancreatic ductal dilatation or surrounding inflammatory changes. Spleen: Normal in size without focal abnormality.  Adrenals/Urinary Tract: Mild bilateral adrenal gland thickening and nodularity is seen. There is stable cortical thinning of the lower pole of the right kidney, with a subcentimeter parenchymal calcifications and d

## 2021-07-20 ENCOUNTER — Telehealth: Payer: Self-pay

## 2021-07-20 NOTE — Telephone Encounter (Signed)
-----   Message from Primus Bravo, MD sent at 07/20/2021  8:51 AM EDT ----- ?Regarding: RE: hospital follow up for renal mass ?This Wednesday or Thursday. ?----- Message ----- ?From: Dorisann Frames, RN ?Sent: 07/20/2021   8:49 AM EDT ?To: Primus Bravo, MD ?Subject: FW: hospital follow up for renal mass         ? ?When would you like to see Ms. Slone back? ? ?----- Message ----- ?From: Orson Eva, MD ?Sent: 07/17/2021  11:04 AM EDT ?To: [facility], # ?Subject: hospital follow up for renal mass             ? ?Can you please schedule follow up for this patient?  She was seen by Dr. Felipa Eth in the hospital.   ?Thank you, ? ?Shanon Brow ? ? ?

## 2021-07-20 NOTE — Telephone Encounter (Signed)
Patient called and scheduled to see Dr. Felipa Eth Wednesday. Patient voiced understanding of appt and location.  ?

## 2021-07-22 ENCOUNTER — Encounter: Payer: Self-pay | Admitting: Urology

## 2021-07-22 ENCOUNTER — Ambulatory Visit (INDEPENDENT_AMBULATORY_CARE_PROVIDER_SITE_OTHER): Payer: HMO | Admitting: Urology

## 2021-07-22 VITALS — BP 90/60 | HR 72 | Ht 66.0 in | Wt 146.0 lb

## 2021-07-22 DIAGNOSIS — N1330 Unspecified hydronephrosis: Secondary | ICD-10-CM | POA: Diagnosis not present

## 2021-07-22 DIAGNOSIS — N2889 Other specified disorders of kidney and ureter: Secondary | ICD-10-CM | POA: Diagnosis not present

## 2021-07-22 DIAGNOSIS — R8290 Unspecified abnormal findings in urine: Secondary | ICD-10-CM

## 2021-07-22 LAB — URINALYSIS, ROUTINE W REFLEX MICROSCOPIC
Bilirubin, UA: NEGATIVE
Nitrite, UA: NEGATIVE
Specific Gravity, UA: 1.025 (ref 1.005–1.030)
Urobilinogen, Ur: 0.2 mg/dL (ref 0.2–1.0)
pH, UA: 6 (ref 5.0–7.5)

## 2021-07-22 LAB — MICROSCOPIC EXAMINATION: RBC, Urine: >30 /hpf — ABNORMAL HIGH (ref 0–2)

## 2021-07-22 NOTE — Progress Notes (Signed)
? ?Assessment: ?1. Left renal mass   ?2. Hydronephrosis, left   ?3. Abnormal urine findings   ? ? ?Plan: ?BMP today ?Urine culture sent today ?MRI of abdomen for further evaluation of left renal mass ?Return to office after MRI done ? ?Chief Complaint: ?Chief Complaint  ?Patient presents with  ? renal mass  ? ? ?HPI: ?Grace Hess is a 66 y.o. female who presents for continued evaluation of a left renal mass and left hydronephrosis.  She was found to have a left renal mass as well as left hydronephrosis during evaluation for left-sided flank pain.  CT imaging showed a 5.4 x 3.7 x 4.4 lobulated mildly hyperdense soft tissue mass in the mid to lower left kidney with apparent mass effect on the left renal pelvis and proximal left ureter with moderate to marked left hydronephrosis.  Her creatinine on presentation was 2.85 and increased to 3.61. Her creatinine ton 07/15/21 had decreased to 2.75.  Her pain was fairly well controlled.  No dysuria or gross hematuria.  She underwent cystoscopy with bilateral retrograde pyelograms, and insertion of left ureteral stent for management of the left hydronephrosis on 07/15/21.  Cystoscopy showed a normal urethra and bladder.  Retrograde pyelogram demonstrated extrinsic compression of the left proximal ureter with associated dilation of the left renal pelvis and calyces.  A 6 French by 26 cm double-J stent was placed.  Her Cr improved to 1.73 on 07/17/21. ? ?She returns today for follow-up.  She is tolerating the stent well.  She has had some frequency.  No gross hematuria.  Her flank pain has improved. ? ? ?Portions of the above documentation were copied from a prior visit for review purposes only. ? ?Allergies: ?Allergies  ?Allergen Reactions  ? Codeine Shortness Of Breath  ? Latex Itching  ? Sulfa Antibiotics Other (See Comments)  ?  Reaction:  Unknown   ? ? ?PMH: ?Past Medical History:  ?Diagnosis Date  ? Anxiety   ? Arthritis   ? "might have a touch in my fingers" (08/02/2016)   ? CAD (coronary artery disease)   ? a. s/p DES to RCA in 07/2016 with residual mid-LAD stenosis  ? Cervicalgia   ? Chest pain, unspecified   ? GERD (gastroesophageal reflux disease)   ? Headache   ? "bad ones after my stroke in 2016; only have them when I get bad news now" (08/02/2016)  ? Heart murmur   ? Heavy smoker (more than 20 cigarettes per day)   ? Scheduled for LDCT screening 02/11/15  ? Hepatitis A   ? "related to Janine Limbo"  ? History of hiatal hernia   ? History of kidney stones   ? "no cysto/OR" (12/10/2014)  ? Hypercholesteremia   ? Hypertension   ? Obesity   ? Reflux esophagitis   ? Stroke [facility])   ? "they said I had a silent stroke a few months back/MRI" (12/10/2014)  ? TIA (transient ischemic attack) 11/28/2014  ? [name] 11/28/2014  ? Type II diabetes mellitus ([place])   ? ? ?PSH: ?Past Surgical History:  ?Procedure Laterality Date  ? ABDOMINAL AORTOGRAM N/A 08/02/2016  ? Procedure: Abdominal Aortogram;  Surgeon: Nelva Bush, MD;  Location: [facility];  Service: Cardiovascular;  Laterality: N/A;  ? CAROTID STENT    ? CHOLECYSTECTOMY OPEN  1978  ? COLONOSCOPY  2010  ? single diverticulum in sigmoid colon  ? CORONARY ANGIOPLASTY WITH STENT PLACEMENT  08/02/2016  ? to RCA  ? CORONARY STENT  INTERVENTION N/A 08/02/2016  ? Procedure: Coronary Stent Intervention;  Surgeon: Nelva Bush, MD;  Location: [facility];  Service: Cardiovascular;  Laterality: N/A;  RCA  ? CYSTOSCOPY W/ RETROGRADES Bilateral 07/15/2021  ? Procedure: CYSTOSCOPY WITH RETROGRADE PYELOGRAM;  Surgeon: Primus Bravo., MD;  Location: [facility];  Service: Urology;  Laterality: Bilateral;  ? CYSTOSCOPY WITH STENT PLACEMENT Left 07/15/2021  ? Procedure: CYSTOSCOPY WITH STENT PLACEMENT;  Surgeon: Primus Bravo., MD;  Location: [facility];  Service: Urology;  Laterality: Left;  ? ENDARTERECTOMY Left 12/02/2014  ? Procedure: ENDARTERECTOMY CAROTID;  Surgeon: Rosetta Posner, MD;  Location: [facility];  Service: Vascular;  Laterality:  Left;  ? LEFT HEART CATH AND CORONARY ANGIOGRAPHY N/A 08/02/2016  ? Procedure: Left Heart Cath and Coronary Angiography;  Surgeon: Nelva Bush, MD;  Location: [facility];  Service: Cardiovascular;  Laterality: N/A;  ? LOWER EXTREMITY ANGIOGRAM  08/02/2016  ? LOWER EXTREMITY ANGIOGRAPHY N/A 08/02/2016  ? Procedure: Lower Extremity Angiography;  Surgeon: Nelva Bush, MD;  Location: [facility];  Service: Cardiovascular;  Laterality: N/A;  ? VAGINAL HYSTERECTOMY  1991  ? "partial"  ? ? ?SH: ?Social History  ? ?Tobacco Use  ? Smoking status: Every Day  ?  Packs/day: 1.50  ?  Years: 43.00  ?  Pack years: 64.50  ?  Types: Cigarettes  ?  Start date: 05/17/1974  ? Smokeless tobacco: Never  ?Vaping Use  ? Vaping Use: Never used  ?Substance Use Topics  ? Alcohol use: No  ?  Alcohol/week: 0.0 standard drinks  ? Drug use: No  ? ? ?ROS: ?Constitutional:  Negative for fever, chills, weight loss ?CV: Negative for chest pain, previous MI, hypertension ?Respiratory:  Negative for shortness of breath, wheezing, sleep apnea, frequent cough ?GI:  Negative for nausea, vomiting, bloody stool, GERD ? ?PE: ?BP 90/60   Pulse 72   Ht '5\' 6"'$  (1.676 m)   Wt 146 lb (66.2 kg)   BMI 23.57 kg/m?  ?GENERAL APPEARANCE:  Well appearing, well developed, well nourished, NAD ?HEENT:  Atraumatic, normocephalic, oropharynx clear ?NECK:  Supple without lymphadenopathy or thyromegaly ?ABDOMEN:  Soft, non-tender, no masses ?EXTREMITIES:  Moves all extremities well, without clubbing, cyanosis, or edema ?NEUROLOGIC:  Alert and oriented x 3, normal gait, CN II-XII grossly intact ?MENTAL STATUS:  appropriate ?BACK:  Non-tender to palpation, No CVAT ?SKIN:  Warm, dry, and intact ? ? ?Results: ?U/A:  11-30 WBC, >30 RBC, many bacteria, nitrite negative ? ?

## 2021-07-23 DIAGNOSIS — I10 Essential (primary) hypertension: Secondary | ICD-10-CM | POA: Diagnosis not present

## 2021-07-23 DIAGNOSIS — Z6823 Body mass index (BMI) 23.0-23.9, adult: Secondary | ICD-10-CM | POA: Diagnosis not present

## 2021-07-23 DIAGNOSIS — N2889 Other specified disorders of kidney and ureter: Secondary | ICD-10-CM | POA: Diagnosis not present

## 2021-07-23 DIAGNOSIS — E1122 Type 2 diabetes mellitus with diabetic chronic kidney disease: Secondary | ICD-10-CM | POA: Diagnosis not present

## 2021-07-23 DIAGNOSIS — R300 Dysuria: Secondary | ICD-10-CM | POA: Diagnosis not present

## 2021-07-23 LAB — BASIC METABOLIC PANEL
BUN/Creatinine Ratio: 21 (ref 12–28)
BUN: 37 mg/dL — ABNORMAL HIGH (ref 8–27)
CO2: 19 mmol/L — ABNORMAL LOW (ref 20–29)
Calcium: 9.5 mg/dL (ref 8.7–10.3)
Chloride: 100 mmol/L (ref 96–106)
Creatinine, Ser: 1.78 mg/dL — ABNORMAL HIGH (ref 0.57–1.00)
Glucose: 222 mg/dL — ABNORMAL HIGH (ref 70–99)
Potassium: 5.1 mmol/L (ref 3.5–5.2)
Sodium: 138 mmol/L (ref 134–144)
eGFR: 31 mL/min/{1.73_m2} — ABNORMAL LOW (ref >59)

## 2021-07-23 NOTE — Progress Notes (Signed)
Patient aware.  She asked if her culture showed an infection.  I told her we will reach out to her with those results once we have them.  ?

## 2021-07-26 ENCOUNTER — Other Ambulatory Visit: Payer: Self-pay | Admitting: Nurse Practitioner

## 2021-07-27 LAB — URINE CULTURE

## 2021-07-28 MED ORDER — CEFDINIR 300 MG PO CAPS: 300.0000 mg | ORAL_CAPSULE | Freq: Two times a day (BID) | ORAL | 0 refills | Status: AC

## 2021-07-28 NOTE — Addendum Note (Signed)
Addended by: Primus Bravo on: 07/28/2021 08:49 AM ? ? Modules accepted: Orders ? ?

## 2021-07-28 NOTE — Progress Notes (Signed)
Patient aware.

## 2021-08-14 ENCOUNTER — Ambulatory Visit (HOSPITAL_COMMUNITY)
Admission: RE | Admit: 2021-08-14 | Discharge: 2021-08-14 | Disposition: A | Discharge status: Home / Self Care | Payer: HMO | Source: Ambulatory Visit | Attending: Urology | Admitting: Urology

## 2021-08-14 DIAGNOSIS — N2889 Other specified disorders of kidney and ureter: Secondary | ICD-10-CM | POA: Insufficient documentation

## 2021-08-14 DIAGNOSIS — C649 Malignant neoplasm of unspecified kidney, except renal pelvis: Secondary | ICD-10-CM | POA: Diagnosis not present

## 2021-08-14 MED ORDER — GADOBUTROL 1 MMOL/ML IV SOLN
7.0000 mL | Freq: Once | INTRAVENOUS | Status: AC | PRN
  Administered 2021-08-14: 7 mL via INTRAVENOUS

## 2021-08-19 ENCOUNTER — Telehealth: Payer: Self-pay

## 2021-08-19 ENCOUNTER — Encounter: Payer: Self-pay | Admitting: Urology

## 2021-08-19 NOTE — Telephone Encounter (Signed)
Patient called with questions about her MRI, spoke to Dr. Felipa Eth and he states he will have to discuss it further with the radiologist before going over results with patient.  Called patient on 06/06 to inform her, patient did not answer had to leave a voicemail informing her.

## 2021-08-20 ENCOUNTER — Encounter: Payer: Self-pay | Admitting: Urology

## 2021-08-20 ENCOUNTER — Ambulatory Visit: Payer: HMO | Admitting: Urology

## 2021-08-20 VITALS — BP 99/53 | HR 65 | Ht 66.0 in | Wt 146.0 lb

## 2021-08-20 DIAGNOSIS — R8290 Unspecified abnormal findings in urine: Secondary | ICD-10-CM

## 2021-08-20 DIAGNOSIS — N2889 Other specified disorders of kidney and ureter: Secondary | ICD-10-CM

## 2021-08-20 DIAGNOSIS — Z7689 Persons encountering health services in other specified circumstances: Secondary | ICD-10-CM | POA: Diagnosis not present

## 2021-08-20 DIAGNOSIS — N1330 Unspecified hydronephrosis: Secondary | ICD-10-CM

## 2021-08-20 LAB — URINALYSIS, ROUTINE W REFLEX MICROSCOPIC
Bilirubin, UA: NEGATIVE
Nitrite, UA: POSITIVE — AB
Specific Gravity, UA: 1.025 (ref 1.005–1.030)
Urobilinogen, Ur: 1 mg/dL (ref 0.2–1.0)
pH, UA: 5 (ref 5.0–7.5)

## 2021-08-20 LAB — MICROSCOPIC EXAMINATION
RBC, Urine: >30 /hpf — AB (ref 0–2)
Renal Epithel, UA: NONE SEEN /hpf

## 2021-08-20 MED ORDER — CEFDINIR 300 MG PO CAPS: 300.0000 mg | ORAL_CAPSULE | Freq: Two times a day (BID) | ORAL | 0 refills | Status: AC

## 2021-08-20 NOTE — Progress Notes (Signed)
Assessment: 1. Left renal mass   2. Hydronephrosis, left   3. Abnormal urine findings     Plan: I reviewed the MRI results from 08/17/21.   I had previously discussed these results with radiology as well. Results discussed with the patient.  Given the findings of a left renal mass with apparent involvement of the left ureter differential diagnosis remains renal cell carcinoma vs urothelial carcinoma. Recommend further evaluation with percutaneous biopsy by IR Urine culture sent today Begin Omnicef 300 mg BID x 7 days for UTI Urine cytology sent today Given the appearance of the mass and involvement of the renal pelvis/ureter, even if this is a renaI cell, I am not sure that a partial nephrectomy would be an option.  She may require a radical nephrectomy/nephroureterectomy.    Given her right renal atrophy and baseline CKD, this will significantly worsen her renal function, possibly requiring dialysis.   Will need to coordinate any treatment with Nephrology  I personally spent 30 minutes involved in face to face and non-face-to-face activities for this patient on the day of the visit.  Professional time spent included the following activities, in addition to those noted in the documentation: Review of MRI results, discussion of MRI results with radiology, discussion of options for dilation and management of the left renal mass, arrangements for percutaneous biopsy.   Chief Complaint: Chief Complaint  Patient presents with   renal mass    HPI: Grace Hess is a 66 y.o. female who presents for continued evaluation of a left renal mass and left hydronephrosis.  She was found to have a left renal mass as well as left hydronephrosis during evaluation for left-sided flank pain.  CT imaging showed a 5.4 x 3.7 x 4.4 lobulated mildly hyperdense soft tissue mass in the mid to lower left kidney with apparent mass effect on the left renal pelvis and proximal left ureter with moderate to marked  left hydronephrosis.  Her creatinine on presentation was 2.85 and increased to 3.61. Her creatinine ton 07/15/21 had decreased to 2.75.  Her pain was fairly well controlled.  No dysuria or gross hematuria.  She underwent cystoscopy with bilateral retrograde pyelograms, and insertion of left ureteral stent for management of the left hydronephrosis on 07/15/21.  Cystoscopy showed a normal urethra and bladder.  Retrograde pyelogram demonstrated extrinsic compression of the left proximal ureter with associated dilation of the left renal pelvis and calyces.  A 6 French by 26 cm double-J stent was placed.  Her Cr improved to 1.73 on 07/17/21.   At her visit in May 2023, her flank pain had improved.  Her creatinine remained stable at 1.78.  Urine culture grew >100 K Klebsiella.  She was treated with cefdinir.  MRI from 08/17/2021 demonstrated a central lower pole left renal mass with apparent extension into the proximal left ureter, no renal vein involvement or evidence of metastatic disease.  She returns today to discuss the MRI results.  She is tolerating the stent.  She has had some urinary frequency and occasional dysuria.  No gross hematuria.  No flank pain.  Portions of the above documentation were copied from a prior visit for review purposes only.  Allergies: Allergies  Allergen Reactions   Codeine Shortness Of Breath   Latex Itching   Sulfa Antibiotics Other (See Comments)    Reaction:  Unknown     PMH: Past Medical History:  Diagnosis Date   Anxiety    Arthritis    "might have a  touch in my fingers" (08/02/2016)   CAD (coronary artery disease)    a. s/p DES to RCA in 07/2016 with residual mid-LAD stenosis   Cervicalgia    Chest pain, unspecified    GERD (gastroesophageal reflux disease)    Headache    "bad ones after my stroke in 2016; only have them when I get bad news now" (08/02/2016)   Heart murmur    Heavy smoker (more than 20 cigarettes per day)    Scheduled for LDCT screening  02/11/15   Hepatitis A    "related to Janine Limbo"   History of hiatal hernia    History of kidney stones    "no cysto/OR" (12/10/2014)   Hypercholesteremia    Hypertension    Obesity    Reflux esophagitis    Stroke [facility])    "they said I had a silent stroke a few months back/MRI" (12/10/2014)   TIA (transient ischemic attack) 11/28/2014   [name] 11/28/2014   Type II diabetes mellitus ([place])     PSH: Past Surgical History:  Procedure Laterality Date   ABDOMINAL AORTOGRAM N/A 08/02/2016   Procedure: Abdominal Aortogram;  Surgeon: Nelva Bush, MD;  Location: [place] [facility];  Service: Cardiovascular;  Laterality: N/A;   CAROTID STENT     CHOLECYSTECTOMY OPEN  1978   COLONOSCOPY  2010   single diverticulum in sigmoid colon   CORONARY ANGIOPLASTY WITH STENT PLACEMENT  08/02/2016   to RCA   CORONARY STENT INTERVENTION N/A 08/02/2016   Procedure: Coronary Stent Intervention;  Surgeon: Nelva Bush, MD;  Location: [place] [facility];  Service: Cardiovascular;  Laterality: N/A;  RCA   CYSTOSCOPY W/ RETROGRADES Bilateral 07/15/2021   Procedure: CYSTOSCOPY WITH RETROGRADE PYELOGRAM;  Surgeon: Primus Bravo., MD;  Location: [facility];  Service: Urology;  Laterality: Bilateral;   CYSTOSCOPY WITH STENT PLACEMENT Left 07/15/2021   Procedure: CYSTOSCOPY WITH STENT PLACEMENT;  Surgeon: Primus Bravo., MD;  Location: [facility];  Service: Urology;  Laterality: Left;   ENDARTERECTOMY Left 12/02/2014   Procedure: ENDARTERECTOMY CAROTID;  Surgeon: Rosetta Posner, MD;  Location: [place];  Service: Vascular;  Laterality: Left;   LEFT HEART CATH AND CORONARY ANGIOGRAPHY N/A 08/02/2016   Procedure: Left Heart Cath and Coronary Angiography;  Surgeon: Nelva Bush, MD;  Location: [place] [facility];  Service: Cardiovascular;  Laterality: N/A;   LOWER EXTREMITY ANGIOGRAM  08/02/2016   LOWER EXTREMITY ANGIOGRAPHY N/A 08/02/2016   Procedure: Lower Extremity Angiography;  Surgeon: Nelva Bush,  MD;  Location: [place] [facility];  Service: Cardiovascular;  Laterality: N/A;   VAGINAL HYSTERECTOMY  1991   "partial"    SH: Social History   Tobacco Use   Smoking status: Every Day    Packs/day: 1.50    Years: 43.00    Total pack years: 64.50    Types: Cigarettes    Start date: 05/17/1974   Smokeless tobacco: Never  Vaping Use   Vaping Use: Never used  Substance Use Topics   Alcohol use: No    Alcohol/week: 0.0 standard drinks of alcohol   Drug use: No    ROS: Constitutional:  Negative for fever, chills, weight loss CV: Negative for chest pain, previous MI, hypertension Respiratory:  Negative for shortness of breath, wheezing, sleep apnea, frequent cough GI:  Negative for nausea, vomiting, bloody stool, GERD  PE: BP (!) 99/53   Pulse 65   Ht '5\' 6"'$  (1.676 m)   Wt 146 lb (66.2 kg)   BMI  23.57 kg/m  GENERAL APPEARANCE:  Well appearing, well developed, well nourished, NAD HEENT:  Atraumatic, normocephalic, oropharynx clear NECK:  Supple without lymphadenopathy or thyromegaly ABDOMEN:  Soft, non-tender, no masses EXTREMITIES:  Moves all extremities well, without clubbing, cyanosis, or edema NEUROLOGIC:  Alert and oriented x 3, normal gait, CN II-XII grossly intact MENTAL STATUS:  appropriate BACK:  Non-tender to palpation, No CVAT SKIN:  Warm, dry, and intact   Results: U/A: 6-10 WBCs, >30 RBCs, many bacteria, nitrite positive

## 2021-08-20 NOTE — Progress Notes (Unsigned)
Arne Cleveland, MD  Riley Lam Spoke to [place] about this one   [facility] for  [facility] renal mass involving ureter  TCCA v RCCA   DDH

## 2021-08-20 NOTE — Progress Notes (Signed)
Urine cytology sent via [place] number [phone]

## 2021-08-22 LAB — URINE CULTURE

## 2021-08-27 ENCOUNTER — Telehealth: Payer: Self-pay

## 2021-08-27 NOTE — Telephone Encounter (Signed)
Patinet noticed an antibiotic at the pharmacy and was concerned that she wasn't aware she needed it.  Informed her Dr. Felipa Eth rx'd at her 6/08 apt for her to begin and sent her urine for a culture and based on the negative culture on 06/13 she did not need to start that rx at this time.

## 2021-08-31 ENCOUNTER — Other Ambulatory Visit: Payer: Self-pay | Admitting: Urology

## 2021-09-09 ENCOUNTER — Other Ambulatory Visit: Payer: Self-pay | Admitting: Radiology

## 2021-09-09 ENCOUNTER — Other Ambulatory Visit: Payer: Self-pay | Admitting: Student

## 2021-09-09 DIAGNOSIS — N2889 Other specified disorders of kidney and ureter: Secondary | ICD-10-CM

## 2021-09-10 ENCOUNTER — Encounter (HOSPITAL_COMMUNITY): Payer: Self-pay

## 2021-09-10 ENCOUNTER — Ambulatory Visit (HOSPITAL_COMMUNITY)
Admission: RE | Admit: 2021-09-10 | Discharge: 2021-09-10 | Disposition: A | Discharge status: Home / Self Care | Payer: HMO | Source: Ambulatory Visit | Attending: Urology | Admitting: Urology

## 2021-09-10 DIAGNOSIS — C642 Malignant neoplasm of left kidney, except renal pelvis: Secondary | ICD-10-CM | POA: Insufficient documentation

## 2021-09-10 DIAGNOSIS — Z87442 Personal history of urinary calculi: Secondary | ICD-10-CM | POA: Insufficient documentation

## 2021-09-10 DIAGNOSIS — E119 Type 2 diabetes mellitus without complications: Secondary | ICD-10-CM | POA: Diagnosis not present

## 2021-09-10 DIAGNOSIS — N1330 Unspecified hydronephrosis: Secondary | ICD-10-CM | POA: Diagnosis not present

## 2021-09-10 DIAGNOSIS — N2889 Other specified disorders of kidney and ureter: Secondary | ICD-10-CM | POA: Insufficient documentation

## 2021-09-10 DIAGNOSIS — C649 Malignant neoplasm of unspecified kidney, except renal pelvis: Secondary | ICD-10-CM | POA: Diagnosis present

## 2021-09-10 LAB — CBC
HCT: 36 % (ref 36.0–46.0)
Hemoglobin: 11.6 g/dL — ABNORMAL LOW (ref 12.0–15.0)
MCH: 29.8 pg (ref 26.0–34.0)
MCHC: 32.2 g/dL (ref 30.0–36.0)
MCV: 92.5 fL (ref 80.0–100.0)
Platelets: 270 10*3/uL (ref 150–400)
RBC: 3.89 MIL/uL (ref 3.87–5.11)
RDW: 14.3 % (ref 11.5–15.5)
WBC: 10.5 10*3/uL (ref 4.0–10.5)
nRBC: 0 % (ref 0.0–0.2)

## 2021-09-10 LAB — PROTIME-INR
INR: 1 (ref 0.8–1.2)
Prothrombin Time: 12.9 seconds (ref 11.4–15.2)

## 2021-09-10 LAB — GLUCOSE, CAPILLARY: Glucose-Capillary: 233 mg/dL — ABNORMAL HIGH (ref 70–99)

## 2021-09-10 MED ORDER — FENTANYL CITRATE (PF) 100 MCG/2ML IJ SOLN
INTRAMUSCULAR | Status: AC | PRN
  Administered 2021-09-10: 50 ug via INTRAVENOUS
  Administered 2021-09-10: 25 ug via INTRAVENOUS

## 2021-09-10 MED ORDER — LIDOCAINE HCL 1 % IJ SOLN
INTRAMUSCULAR | Status: AC
  Filled 2021-09-10: qty 10

## 2021-09-10 MED ORDER — MIDAZOLAM HCL 2 MG/2ML IJ SOLN
INTRAMUSCULAR | Status: AC | PRN
  Administered 2021-09-10: 1 mg via INTRAVENOUS
  Administered 2021-09-10: .5 mg via INTRAVENOUS

## 2021-09-10 MED ORDER — GELATIN ABSORBABLE 12-7 MM EX MISC
CUTANEOUS | Status: AC
  Filled 2021-09-10: qty 1

## 2021-09-10 MED ORDER — SODIUM CHLORIDE 0.9 % IV SOLN: INTRAVENOUS | Status: DC

## 2021-09-10 MED ORDER — FENTANYL CITRATE (PF) 100 MCG/2ML IJ SOLN
INTRAMUSCULAR | Status: AC
  Filled 2021-09-10: qty 2

## 2021-09-10 MED ORDER — MIDAZOLAM HCL 2 MG/2ML IJ SOLN
INTRAMUSCULAR | Status: AC
  Filled 2021-09-10: qty 2

## 2021-09-10 NOTE — H&P (Signed)
Chief Complaint: Patient was seen in consultation today for left renal mass  Referring Physician(s): Stoneking,Bradley J.  Supervising Physician: Corrie Mckusick  Patient Status: [facility] - Out-pt  History of Present Illness: Grace Hess is a 66 y.o. female with history of left renal mass.   CT imaging showed a 5.4 x 3.7 x 4.4 lobulated mildly hyperdense soft tissue mass in the mid to lower left kidney with apparent mass effect on the left renal pelvis and proximal left ureter with moderate to marked left hydronephrosis.    She underwent cystoscopy with bilateral retrograde pyelograms, and insertion of left ureteral stent for management of the left hydronephrosis on 07/15/21. A 6 French by 26 cm double-J stent was placed. Her Cr improved and hematuria resolved.  She now presents to [facility] for left renal mass biopsy at the requset of Dr. Felipa Eth.  Case reviewed and approved by Dr. Vernard Gambles.  Grace Hess presents in her usual state of health today.  She has been NPO.  She has held Plavix for 1 week.   Past Medical History:  Diagnosis Date   Anxiety    Arthritis    "might have a touch in my fingers" (08/02/2016)   CAD (coronary artery disease)    a. s/p DES to RCA in 07/2016 with residual mid-LAD stenosis   Cervicalgia    Chest pain, unspecified    GERD (gastroesophageal reflux disease)    Headache    "bad ones after my stroke in 2016; only have them when I get bad news now" (08/02/2016)   Heart murmur    Heavy smoker (more than 20 cigarettes per day)    Scheduled for LDCT screening 02/11/15   Hepatitis A    "related to Janine Limbo"   History of hiatal hernia    History of kidney stones    "no cysto/OR" (12/10/2014)   Hypercholesteremia    Hypertension    Obesity    Reflux esophagitis    Stroke [facility])    "they said I had a silent stroke a few months back/MRI" (12/10/2014)   TIA (transient ischemic attack) 11/28/2014   [name] 11/28/2014   Type II diabetes mellitus ([place])      Past Surgical History:  Procedure Laterality Date   ABDOMINAL AORTOGRAM N/A 08/02/2016   Procedure: Abdominal Aortogram;  Surgeon: Nelva Bush, MD;  Location: [place] [facility];  Service: Cardiovascular;  Laterality: N/A;   CAROTID STENT     CHOLECYSTECTOMY OPEN  1978   COLONOSCOPY  2010   single diverticulum in sigmoid colon   CORONARY ANGIOPLASTY WITH STENT PLACEMENT  08/02/2016   to RCA   CORONARY STENT INTERVENTION N/A 08/02/2016   Procedure: Coronary Stent Intervention;  Surgeon: Nelva Bush, MD;  Location: [place] [facility];  Service: Cardiovascular;  Laterality: N/A;  RCA   CYSTOSCOPY W/ RETROGRADES Bilateral 07/15/2021   Procedure: CYSTOSCOPY WITH RETROGRADE PYELOGRAM;  Surgeon: Primus Bravo., MD;  Location: [facility];  Service: Urology;  Laterality: Bilateral;   CYSTOSCOPY WITH STENT PLACEMENT Left 07/15/2021   Procedure: CYSTOSCOPY WITH STENT PLACEMENT;  Surgeon: Primus Bravo., MD;  Location: [facility];  Service: Urology;  Laterality: Left;   ENDARTERECTOMY Left 12/02/2014   Procedure: ENDARTERECTOMY CAROTID;  Surgeon: Rosetta Posner, MD;  Location: [place];  Service: Vascular;  Laterality: Left;   LEFT HEART CATH AND CORONARY ANGIOGRAPHY N/A 08/02/2016   Procedure: Left Heart Cath and Coronary Angiography;  Surgeon: Nelva Bush, MD;  Location: [place] [facility];  Service: Cardiovascular;  Laterality: N/A;   LOWER EXTREMITY ANGIOGRAM  08/02/2016   LOWER EXTREMITY ANGIOGRAPHY N/A 08/02/2016   Procedure: Lower Extremity Angiography;  Surgeon: Nelva Bush, MD;  Location: [place] [facility];  Service: Cardiovascular;  Laterality: N/A;   VAGINAL HYSTERECTOMY  1991   "partial"    Allergies: Codeine, Latex, and Sulfa antibiotics  Medications: Prior to Admission medications   Medication Sig Start Date End Date Taking? Authorizing Provider  acetaminophen (TYLENOL) 325 MG tablet Take 2 tablets (650 mg total) by mouth every 4 (four) hours as needed for  headache or mild pain. 08/03/16  Yes Kilroy, Luke K, PA-C  albuterol (VENTOLIN HFA) 108 (90 Base) MCG/ACT inhaler Inhale 1-2 puffs into the lungs every 4 (four) hours as needed for shortness of breath or wheezing.   Yes [provider]  ALPRAZolam Duanne Moron) 0.5 MG tablet Take 0.5 mg by mouth 2 (two) times daily as needed. 03/20/19  Yes [provider]  amLODipine (NORVASC) 5 MG tablet Take 1 tablet (5 mg total) by mouth daily. 07/17/21  Yes Tat, Shanon Brow, MD  benzonatate (TESSALON) 100 MG capsule Take 100 mg by mouth 4 (four) times daily as needed. 03/03/21  Yes [provider]  Biotin 5000 MCG CAPS Take 1 capsule by mouth daily.   Yes [provider]  Cholecalciferol (VITAMIN D3) 125 MCG (5000 UT) CAPS Take 1 capsule by mouth once a week. Tuesday   Yes [provider]  escitalopram (LEXAPRO) 20 MG tablet Take 20 mg by mouth daily. 01/01/21  Yes [provider]  gabapentin (NEURONTIN) 300 MG capsule Take 300 mg by mouth at bedtime.   Yes [provider]  glipiZIDE (GLUCOTROL XL) 2.5 MG 24 hr tablet TAKE 1 TABLET(2.5 MG) BY MOUTH DAILY WITH BREAKFAST 07/27/21  Yes Brita Romp, NP  metoprolol tartrate (LOPRESSOR) 25 MG tablet Take 1 tablet (25 mg total) by mouth 2 (two) times daily. 03/19/21  Yes Strader, [place] M, PA-C  nystatin cream (MYCOSTATIN) Apply 1 application topically 2 (two) times daily as needed. 07/10/16  Yes [provider]  ondansetron (ZOFRAN) 4 MG tablet TAKE 1 TABLET(4 MG) BY MOUTH TWICE DAILY AS NEEDED FOR NAUSEA OR VOMITING 04/06/21  Yes Montez Morita, Quillian Quince, MD  oxyCODONE-acetaminophen (PERCOCET/ROXICET) 5-325 MG tablet Take 1 tablet by mouth 3 (three) times daily as needed. 08/20/19  Yes [provider]  OZEMPIC, 0.25 OR 0.5 MG/DOSE, 2 MG/1.5ML SOPN INJECT 0.5MG INTO THE SKIN ONCE WEEKLY Patient taking differently: Inject 0.5 mg into the skin once a week. 07/08/21  Yes Brita Romp, NP  pantoprazole  (PROTONIX) 40 MG tablet Take 1 tablet (40 mg total) by mouth daily before breakfast. 01/13/21  Yes Carlan, Chelsea L, NP  rosuvastatin (CRESTOR) 20 MG tablet TAKE 1 TABLET(20 MG) BY MOUTH DAILY Patient taking differently: Take 20 mg by mouth daily. 02/23/21  Yes Arnoldo Lenis, MD  aspirin 81 MG EC tablet Take by mouth. 12/03/14   [provider]  Blood Glucose Monitoring Suppl (ACCU-CHEK GUIDE) w/Device KIT 1 Piece by Does not apply route as directed. 08/23/19   Cassandria Anger, MD  clopidogrel (PLAVIX) 75 MG tablet Take 1 tablet (75 mg total) by mouth daily. 12/03/14   Rai, Ripudeep K, MD  glucose blood (ACCU-CHEK GUIDE) test strip Use as instructed 08/23/19   Cassandria Anger, MD  metFORMIN (GLUCOPHAGE) 500 MG tablet Take 500 mg by mouth 2 (two) times daily with a meal. 04/05/17   [provider]     Family History  Problem Relation Age of Onset   Congestive Heart Failure Mother    COPD Father    Cancer Brother    Cancer Brother     Social History   Socioeconomic History   Marital status: Married    Spouse name: Not on file   Number of children: 2   Years of education: 12   Highest education level: Not on file  Occupational History   Not on file  Tobacco Use   Smoking status: Every Day    Packs/day: 1.50    Years: 43.00    Total pack years: 64.50    Types: Cigarettes    Start date: 05/17/1974   Smokeless tobacco: Never  Vaping Use   Vaping Use: Never used  Substance and Sexual Activity   Alcohol use: No    Alcohol/week: 0.0 standard drinks of alcohol   Drug use: No   Sexual activity: Never  Other Topics Concern   Not on file  Social History Narrative   Patient does not drink caffeine.   Patient is right handed.    Social Determinants of Health   Financial Resource Strain: Not on file  Food Insecurity: Not on file  Transportation Needs: Not on file  Physical Activity: Not on file  Stress: Not on file  Social Connections: Not on file      Review of Systems: A 12 point ROS discussed and pertinent positives are indicated in the HPI above.  All other systems are negative.  Review of Systems  Constitutional:  Negative for fatigue and fever.  Respiratory:  Negative for cough and shortness of breath.   Cardiovascular:  Negative for chest pain.  Gastrointestinal:  Negative for abdominal pain, nausea and vomiting.  Musculoskeletal:  Negative for back pain.  Psychiatric/Behavioral:  Negative for behavioral problems and confusion.     Vital Signs: BP 120/65   Pulse 67   Temp 97.9 F (36.6 C) (Oral)   Resp 16   Ht '5\' 6"'  (1.676 m)   Wt 147 lb (66.7 kg)   SpO2 97%   BMI 23.73 kg/m   Physical Exam Vitals and nursing note reviewed.  Constitutional:      General: She is not in acute distress.    Appearance: Normal appearance. She is not ill-appearing.  HENT:     Mouth/Throat:     Mouth: Mucous membranes are moist.     Pharynx: Oropharynx is clear.  Cardiovascular:     Rate and Rhythm: Normal rate and regular rhythm.  Abdominal:     General: Abdomen is flat.     Palpations: Abdomen is soft.  Musculoskeletal:     Cervical back: Normal range of motion.  Neurological:     General: No focal deficit present.     Mental Status: She is alert and oriented to person, place, and time. Mental status is at baseline.  Psychiatric:        Mood and Affect: Mood normal.        Behavior: Behavior normal.        Thought Content: Thought content normal.        Judgment: Judgment normal.      MD Evaluation Airway: WNL Heart: WNL Abdomen: WNL Chest/ Lungs: WNL ASA  Classification: 3 Mallampati/Airway Score: Two   Imaging: MR Abdomen W Wo Contrast  Result Date: 08/17/2021 CLINICAL DATA:  Left renal mass on CT. EXAM: MRI ABDOMEN WITHOUT AND WITH CONTRAST TECHNIQUE: Multiplanar multisequence MR imaging  of the abdomen was performed both before and after the administration of intravenous contrast. CONTRAST:  43m GADAVIST  GADOBUTROL 1 MMOL/ML IV SOLN COMPARISON:  07/13/2021 CT FINDINGS: Lower chest: Normal heart size without pericardial or pleural effusion. Hepatobiliary: Normal liver. Cholecystectomy, without biliary ductal dilatation. Pancreas:  Normal, without mass or ductal dilatation. Spleen:  Normal in size, without focal abnormality. Adrenals/Urinary Tract: Normal adrenal glands. The right kidney has an atypical morphology which is likely developmental. Similar to 10/20/2013. Corresponding to the CT abnormality, centered within the central lower pole left kidney is a 4.0 x 3.7 x 5.3 cm enhancing mass including on 25/7 and 18/4. This tracks along extends into the proximal left ureter, including on 42/22 and diffusion-weighted image 29/8. Somewhat infiltrative morphology. No complicating obstruction. Stomach/Bowel: Normal stomach and abdominal bowel loops. Vascular/Lymphatic: Advanced aortic and branch vessel atherosclerosis. Patent renal veins. Multiple small abdominal retroperitoneal nodes. None are pathologic by size criteria. Other: No ascites. A right abdominal wall subcutaneous nodule measures 6 mm on 71/16 and is new since the prior CT. Musculoskeletal: No acute osseous abnormality. IMPRESSION: 1. 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 Renal cell carcinoma extending into the collecting system felt less likely. 2. No renal vein involvement or evidence of abdominal metastatic disease. Multiple small abdominal retroperitoneal nodes, none pathologic by size criteria. 3.  Aortic Atherosclerosis (ICD10-I70.0). 4. Right abdominal wall subcutaneous nodule is nonspecific but new since 07/13/2021. Electronically Signed   By: KAbigail MiyamotoM.D.   On: 08/17/2021 08:36    Labs:  CBC: Recent Labs    07/13/21 1659 07/14/21 1048 07/15/21 0608 09/10/21 0630  WBC 13.7* 13.0* 10.6* 10.5  HGB 11.8* 11.4* 11.0* 11.6*  HCT  36.8 36.1 35.6* 36.0  PLT 250 263 236 270    COAGS: Recent Labs    09/10/21 0630  INR 1.0    BMP: Recent Labs    07/14/21 1048 07/15/21 0608 07/16/21 0439 07/17/21 0456 07/22/21 1513  NA 136 140 141 141 138  K 5.1 5.2* 4.7 4.2 5.1  CL 102 112* 112* 111 100  CO2 22 19* 21* 21* 19*  GLUCOSE 202* 116* 93 104* 222*  BUN 79* 73* 53* 35* 37*  CALCIUM 8.8* 8.7* 8.7* 9.0 9.5  CREATININE 3.61* 2.75* 2.25* 1.73* 1.78*  GFRNONAA 13* 18* 23* 32*  --     LIVER FUNCTION TESTS: Recent Labs    11/25/20 0902 03/30/21 0803 07/14/21 1048  BILITOT 0.3 0.5 0.4  AST '14 11 25  ' ALT '21 8 25  ' ALKPHOS 56 101 87  PROT 6.7 6.7 7.1  ALBUMIN 4.6 4.1 3.6    TUMOR MARKERS: No results for input(s): "AFPTM", "CEA", "CA199", "CHROMGRNA" in the last 8760 hours.  Assessment and Plan: Patient with past medical history of left renal mass presents for left renal mass biopsy at the request of Dr. SFelipa Eth Case reviewed by Dr. HVernard Gambleswho approves patient for procedure.  Patient presents today in their usual state of health.  She has been NPO and is not currently on blood thinners as she has held her Plavix for one week.   Risks and benefits was discussed with the patient and/or patient's family including, but not limited to bleeding, infection, damage to adjacent structures or low yield requiring additional tests.  All of the questions were answered and there is agreement to proceed.  Consent signed and in chart.  Thank you for this  interesting consult.  I greatly enjoyed meeting FLOREE ZUNIGA and look forward to participating in their care.  A copy of this report was sent to the requesting provider on this date.  Electronically Signed: Docia Barrier, PA 09/10/2021, 8:11 AM   I spent a total of  30 Minutes   in face to face in clinical consultation, greater than 50% of which was counseling/coordinating care for left renal mass.

## 2021-09-10 NOTE — Procedures (Signed)
Interventional Radiology Procedure Note  Procedure:   CT guided left renal mass biopsy. Mx 16X core   Complications: None EBL: 0 Specimen: Mx 18g core  Recommendations:  - Ok to shower tomorrow - Do not submerge for 7 days - Routine wound care - 2 hrs DC home - Supine recovery - advance diet   Signed,  Dulcy Fanny. Earleen Newport, DO

## 2021-09-11 LAB — SURGICAL PATHOLOGY

## 2021-09-16 ENCOUNTER — Telehealth: Payer: Self-pay

## 2021-09-16 DIAGNOSIS — G8929 Other chronic pain: Secondary | ICD-10-CM | POA: Diagnosis not present

## 2021-09-16 DIAGNOSIS — Z79891 Long term (current) use of opiate analgesic: Secondary | ICD-10-CM | POA: Diagnosis not present

## 2021-09-16 DIAGNOSIS — M545 Low back pain, unspecified: Secondary | ICD-10-CM | POA: Diagnosis not present

## 2021-09-16 DIAGNOSIS — F419 Anxiety disorder, unspecified: Secondary | ICD-10-CM | POA: Diagnosis not present

## 2021-09-16 DIAGNOSIS — M25511 Pain in right shoulder: Secondary | ICD-10-CM | POA: Diagnosis not present

## 2021-09-16 NOTE — Telephone Encounter (Signed)
I called and spoke with patient today to let her know your were in OR today. She asked if you could call her about her recent results.

## 2021-09-16 NOTE — Telephone Encounter (Signed)
Patient called this morning in regard to her results form her Biopsy. She advised she seen the results stating she had cancer but wanted to know when someone from our office would be reaching out to discuss same.

## 2021-09-17 ENCOUNTER — Other Ambulatory Visit: Payer: Self-pay | Admitting: Urology

## 2021-09-17 ENCOUNTER — Encounter: Payer: Self-pay | Admitting: Urology

## 2021-09-17 DIAGNOSIS — N189 Chronic kidney disease, unspecified: Secondary | ICD-10-CM

## 2021-09-17 DIAGNOSIS — N2889 Other specified disorders of kidney and ureter: Secondary | ICD-10-CM

## 2021-09-17 NOTE — Telephone Encounter (Signed)
Patient needing a call back as soon as possible to discuss next steps for future care.  Please advise.  Call back:  [phone] [facility])  Thanks, Helene Kelp

## 2021-09-22 NOTE — Telephone Encounter (Signed)
Notified by doctor on 7/6

## 2021-09-24 DIAGNOSIS — Z1331 Encounter for screening for depression: Secondary | ICD-10-CM | POA: Diagnosis not present

## 2021-09-24 DIAGNOSIS — C649 Malignant neoplasm of unspecified kidney, except renal pelvis: Secondary | ICD-10-CM | POA: Diagnosis not present

## 2021-09-24 DIAGNOSIS — N2889 Other specified disorders of kidney and ureter: Secondary | ICD-10-CM | POA: Diagnosis not present

## 2021-09-24 DIAGNOSIS — E118 Type 2 diabetes mellitus with unspecified complications: Secondary | ICD-10-CM | POA: Diagnosis not present

## 2021-09-24 DIAGNOSIS — Z6824 Body mass index (BMI) 24.0-24.9, adult: Secondary | ICD-10-CM | POA: Diagnosis not present

## 2021-09-24 DIAGNOSIS — N183 Chronic kidney disease, stage 3 unspecified: Secondary | ICD-10-CM | POA: Diagnosis not present

## 2021-09-24 DIAGNOSIS — I739 Peripheral vascular disease, unspecified: Secondary | ICD-10-CM | POA: Diagnosis not present

## 2021-09-24 DIAGNOSIS — I10 Essential (primary) hypertension: Secondary | ICD-10-CM | POA: Diagnosis not present

## 2021-09-24 DIAGNOSIS — Z0001 Encounter for general adult medical examination with abnormal findings: Secondary | ICD-10-CM | POA: Diagnosis not present

## 2021-09-26 DIAGNOSIS — E1129 Type 2 diabetes mellitus with other diabetic kidney complication: Secondary | ICD-10-CM | POA: Diagnosis not present

## 2021-09-26 DIAGNOSIS — E559 Vitamin D deficiency, unspecified: Secondary | ICD-10-CM | POA: Diagnosis not present

## 2021-09-26 DIAGNOSIS — D638 Anemia in other chronic diseases classified elsewhere: Secondary | ICD-10-CM | POA: Diagnosis not present

## 2021-09-26 DIAGNOSIS — N189 Chronic kidney disease, unspecified: Secondary | ICD-10-CM | POA: Diagnosis not present

## 2021-09-26 DIAGNOSIS — N2889 Other specified disorders of kidney and ureter: Secondary | ICD-10-CM | POA: Diagnosis not present

## 2021-09-26 DIAGNOSIS — I9589 Other hypotension: Secondary | ICD-10-CM | POA: Diagnosis not present

## 2021-09-26 DIAGNOSIS — Z716 Tobacco abuse counseling: Secondary | ICD-10-CM | POA: Diagnosis not present

## 2021-09-26 DIAGNOSIS — E8722 Chronic metabolic acidosis: Secondary | ICD-10-CM | POA: Diagnosis not present

## 2021-09-26 DIAGNOSIS — Z8679 Personal history of other diseases of the circulatory system: Secondary | ICD-10-CM | POA: Diagnosis not present

## 2021-09-26 DIAGNOSIS — R809 Proteinuria, unspecified: Secondary | ICD-10-CM | POA: Diagnosis not present

## 2021-09-26 DIAGNOSIS — E1122 Type 2 diabetes mellitus with diabetic chronic kidney disease: Secondary | ICD-10-CM | POA: Diagnosis not present

## 2021-09-28 DIAGNOSIS — E1159 Type 2 diabetes mellitus with other circulatory complications: Secondary | ICD-10-CM | POA: Diagnosis not present

## 2021-09-29 LAB — COMPREHENSIVE METABOLIC PANEL
ALT: 9 IU/L (ref 0–32)
AST: 11 IU/L (ref 0–40)
Albumin/Globulin Ratio: 1.8 (ref 1.2–2.2)
Albumin: 4.2 g/dL (ref 3.9–4.9)
Alkaline Phosphatase: 101 IU/L (ref 44–121)
BUN/Creatinine Ratio: 19 (ref 12–28)
BUN: 30 mg/dL — ABNORMAL HIGH (ref 8–27)
Bilirubin Total: 0.3 mg/dL (ref 0.0–1.2)
CO2: 20 mmol/L (ref 20–29)
Calcium: 9.5 mg/dL (ref 8.7–10.3)
Chloride: 105 mmol/L (ref 96–106)
Creatinine, Ser: 1.61 mg/dL — ABNORMAL HIGH (ref 0.57–1.00)
Globulin, Total: 2.4 g/dL (ref 1.5–4.5)
Glucose: 184 mg/dL — ABNORMAL HIGH (ref 70–99)
Potassium: 4.9 mmol/L (ref 3.5–5.2)
Sodium: 141 mmol/L (ref 134–144)
Total Protein: 6.6 g/dL (ref 6.0–8.5)
eGFR: 35 mL/min/{1.73_m2} — ABNORMAL LOW (ref >59)

## 2021-10-01 ENCOUNTER — Ambulatory Visit (INDEPENDENT_AMBULATORY_CARE_PROVIDER_SITE_OTHER): Payer: HMO | Admitting: Nurse Practitioner

## 2021-10-01 ENCOUNTER — Inpatient Hospital Stay (HOSPITAL_COMMUNITY): Payer: HMO | Attending: Hematology | Admitting: Hematology

## 2021-10-01 ENCOUNTER — Encounter: Payer: Self-pay | Admitting: Nurse Practitioner

## 2021-10-01 ENCOUNTER — Encounter (HOSPITAL_COMMUNITY): Payer: Self-pay

## 2021-10-01 VITALS — BP 111/64 | HR 68 | Ht 66.0 in | Wt 151.0 lb

## 2021-10-01 DIAGNOSIS — Z79899 Other long term (current) drug therapy: Secondary | ICD-10-CM | POA: Insufficient documentation

## 2021-10-01 DIAGNOSIS — I10 Essential (primary) hypertension: Secondary | ICD-10-CM | POA: Insufficient documentation

## 2021-10-01 DIAGNOSIS — E782 Mixed hyperlipidemia: Secondary | ICD-10-CM | POA: Diagnosis not present

## 2021-10-01 DIAGNOSIS — E119 Type 2 diabetes mellitus without complications: Secondary | ICD-10-CM | POA: Insufficient documentation

## 2021-10-01 DIAGNOSIS — C642 Malignant neoplasm of left kidney, except renal pelvis: Secondary | ICD-10-CM | POA: Diagnosis not present

## 2021-10-01 DIAGNOSIS — F17210 Nicotine dependence, cigarettes, uncomplicated: Secondary | ICD-10-CM | POA: Diagnosis not present

## 2021-10-01 DIAGNOSIS — E1159 Type 2 diabetes mellitus with other circulatory complications: Secondary | ICD-10-CM | POA: Diagnosis not present

## 2021-10-01 DIAGNOSIS — E559 Vitamin D deficiency, unspecified: Secondary | ICD-10-CM

## 2021-10-01 LAB — POCT GLYCOSYLATED HEMOGLOBIN (HGB A1C): HbA1c POC (<> result, manual entry): 8.4 % (ref 4.0–5.6)

## 2021-10-01 NOTE — Progress Notes (Signed)
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 66 y.o. 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 Gerarda Hess.  Today she reports feeling good, and she is accompanied by her husband. She denies unintentional weight loss in the past 6 months, fevers, night sweats, hematuria, and new pains. She reports she has hot flashes due to menopause. She reports she found out "when she was younger" that her right kidney was "half the size of her left". She denies personal history of cancer. She receives Ozempic injections on the left side of her abdomen once weekly. She reports a CVA in 2016, and she denies having any residual weakness following this episode. She denies history of MI.     She currently lives at home with her husband, and she is able to do all of her typical daily activities. Prior to retirement she worked in [employer] She currently smokes 2 ppd and has smoked since she was 67 years old. Her brother had "throat" cancer with smoking history, her maternal uncle died with cancer of an unknown type, her maternal grandmother had lung cancer, a paternal uncle had cancer of an unknown type, and a cousin had a brain tumor.   MEDICAL HISTORY:  Past Medical History:  Diagnosis Date   Anxiety    Arthritis    "might have a touch in my fingers" (08/02/2016)   CAD (coronary artery disease)    a. s/p DES to RCA in 07/2016 with residual mid-LAD stenosis   Cervicalgia    Chest pain, unspecified    GERD (gastroesophageal  reflux disease)    Headache    "bad ones after my stroke in 2016; only have them when I get bad news now" (08/02/2016)   Heart murmur    Heavy smoker (more than 20 cigarettes per day)    Scheduled for LDCT screening 02/11/15   Hepatitis A    "related to Grace Hess"   History of hiatal hernia    History of kidney stones    "no cysto/OR" (12/10/2014)   Hypercholesteremia    Hypertension    Obesity    Reflux esophagitis    Stroke [facility])    "they said I had a silent stroke a few months back/MRI" (12/10/2014)   TIA (transient ischemic attack) 11/28/2014   Grace Hess 11/28/2014   Type II diabetes mellitus ([place])     SURGICAL HISTORY: Past Surgical History:  Procedure Laterality Date   ABDOMINAL AORTOGRAM N/A 08/02/2016   Procedure: Abdominal Aortogram;  Surgeon: Grace Bush, MD;  Location: [facility];  Service: Cardiovascular;  Laterality: N/A;   CAROTID STENT     CHOLECYSTECTOMY OPEN  1978   COLONOSCOPY  2010   single diverticulum in sigmoid colon   CORONARY ANGIOPLASTY WITH STENT PLACEMENT  08/02/2016   to RCA   CORONARY STENT INTERVENTION N/A 08/02/2016   Procedure: Coronary Stent Intervention;  Surgeon: Grace Bush, MD;  Location: [facility];  Service: Cardiovascular;  Laterality: N/A;  RCA   CYSTOSCOPY W/ RETROGRADES Bilateral 07/15/2021   Procedure: CYSTOSCOPY WITH  RETROGRADE PYELOGRAM;  Surgeon: Primus Bravo., MD;  Location: [facility];  Service: Urology;  Laterality: Bilateral;   CYSTOSCOPY WITH STENT PLACEMENT Left 07/15/2021   Procedure: CYSTOSCOPY WITH STENT PLACEMENT;  Surgeon: Primus Bravo., MD;  Location: [facility];  Service: Urology;  Laterality: Left;   ENDARTERECTOMY Left 12/02/2014   Procedure: ENDARTERECTOMY CAROTID;  Surgeon: Rosetta Posner, MD;  Location: [facility];  Service: Vascular;  Laterality: Left;   LEFT HEART CATH AND CORONARY ANGIOGRAPHY N/A 08/02/2016   Procedure: Left Heart Cath and Coronary Angiography;  Surgeon: Grace Bush, MD;   Location: [facility];  Service: Cardiovascular;  Laterality: N/A;   LOWER EXTREMITY ANGIOGRAM  08/02/2016   LOWER EXTREMITY ANGIOGRAPHY N/A 08/02/2016   Procedure: Lower Extremity Angiography;  Surgeon: Grace Bush, MD;  Location: [facility];  Service: Cardiovascular;  Laterality: N/A;   VAGINAL HYSTERECTOMY  1991   "partial"    SOCIAL HISTORY: Social History   Socioeconomic History   Marital status: Married    Spouse name: Not on file   Number of children: 2   Years of education: 12   Highest education level: Not on file  Occupational History   Not on file  Tobacco Use   Smoking status: Every Day    Packs/day: 1.50    Years: 43.00    Total pack years: 64.50    Types: Cigarettes    Start date: 05/17/1974   Smokeless tobacco: Never  Vaping Use   Vaping Use: Never used  Substance and Sexual Activity   Alcohol use: No    Alcohol/week: 0.0 standard drinks of alcohol   Drug use: No   Sexual activity: Never  Other Topics Concern   Not on file  Social History Narrative   Patient does not drink caffeine.   Patient is right handed.    Social Determinants of Health   Financial Resource Strain: Not on file  Food Insecurity: Not on file  Transportation Needs: Not on file  Physical Activity: Not on file  Stress: Not on file  Social Connections: Not on file  Intimate Partner Violence: Not on file    FAMILY HISTORY: Family History  Problem Relation Age of Onset   Congestive Heart Failure Mother    COPD Father    Cancer Brother    Cancer Brother     ALLERGIES:  is allergic to codeine, latex, and sulfa antibiotics.  MEDICATIONS:  Current Outpatient Medications  Medication Sig Dispense Refill   acetaminophen (TYLENOL) 325 MG tablet Take 2 tablets (650 mg total) by mouth every 4 (four) hours as needed for headache or mild pain.     albuterol (VENTOLIN HFA) 108 (90 Base) MCG/ACT inhaler Inhale 1-2 puffs into the lungs every 4 (four) hours as needed  for shortness of breath or wheezing.     ALPRAZolam (XANAX) 0.5 MG tablet Take 0.5 mg by mouth 2 (two) times daily as needed.     amLODipine (NORVASC) 5 MG tablet Take 1 tablet (5 mg total) by mouth daily. 30 tablet 1   aspirin 81 MG EC tablet Take by mouth.     benzonatate (TESSALON) 100 MG capsule Take 100 mg by mouth 4 (four) times daily as needed.     Biotin 5000 MCG CAPS Take 1 capsule by mouth daily.     Blood Glucose Monitoring Suppl (ACCU-CHEK GUIDE) w/Device KIT 1 Piece by Does not apply route as directed. 1 kit 0   Cholecalciferol (VITAMIN D3) 125 MCG (5000  UT) CAPS Take 1 capsule by mouth once a week. Tuesday     clopidogrel (PLAVIX) 75 MG tablet Take 1 tablet (75 mg total) by mouth daily. 30 tablet 3   escitalopram (LEXAPRO) 20 MG tablet Take 20 mg by mouth daily.     gabapentin (NEURONTIN) 300 MG capsule Take 300 mg by mouth at bedtime.     glipiZIDE (GLUCOTROL XL) 2.5 MG 24 hr tablet TAKE 1 TABLET(2.5 MG) BY MOUTH DAILY WITH BREAKFAST 30 tablet 2   glucose blood (ACCU-CHEK GUIDE) test strip Use as instructed 150 each 2   metFORMIN (GLUCOPHAGE) 500 MG tablet Take 500 mg by mouth 2 (two) times daily with a meal.     metoprolol tartrate (LOPRESSOR) 25 MG tablet Take 1 tablet (25 mg total) by mouth 2 (two) times daily. 180 tablet 3   nystatin cream (MYCOSTATIN) Apply 1 application topically 2 (two) times daily as needed.  5   ondansetron (ZOFRAN) 4 MG tablet TAKE 1 TABLET(4 MG) BY MOUTH TWICE DAILY AS NEEDED FOR NAUSEA OR VOMITING 20 tablet 0   oxyCODONE-acetaminophen (PERCOCET/ROXICET) 5-325 MG tablet Take 1 tablet by mouth 3 (three) times daily as needed.     OZEMPIC, 0.25 OR 0.5 MG/DOSE, 2 MG/1.5ML SOPN INJECT 0.5MG INTO THE SKIN ONCE WEEKLY (Patient taking differently: Inject 0.5 mg into the skin once a week.) 3 mL 2   pantoprazole (PROTONIX) 40 MG tablet Take 1 tablet (40 mg total) by mouth daily before breakfast. 90 tablet 3   rosuvastatin (CRESTOR) 20 MG tablet TAKE 1 TABLET(20  MG) BY MOUTH DAILY (Patient taking differently: Take 20 mg by mouth daily.) 90 tablet 3   No current facility-administered medications for this visit.    REVIEW OF SYSTEMS:   Review of Systems  Constitutional:  Negative for appetite change, fatigue, fever and unexpected weight change.  Respiratory:  Positive for cough.   Cardiovascular:  Positive for palpitations.  Gastrointestinal:  Positive for nausea.  Endocrine: Positive for hot flashes.  Genitourinary:  Positive for dysuria.   Musculoskeletal:  Positive for back pain (5/10 lower).  All other systems reviewed and are negative.    PHYSICAL EXAMINATION: ECOG PERFORMANCE STATUS: 1 - Symptomatic but completely ambulatory  Vitals:   10/01/21 0824  BP: (!) 120/51  Pulse: 65  Resp: 18  Temp: 98.3 F (36.8 C)  SpO2: 97%   Filed Weights   10/01/21 0824  Weight: 150 lb 12.8 oz (68.4 kg)   Physical Exam Vitals reviewed.  Constitutional:      Appearance: Normal appearance.  Cardiovascular:     Rate and Rhythm: Normal rate and regular rhythm.     Pulses: Normal pulses.     Heart sounds: Normal heart sounds.  Pulmonary:     Effort: Pulmonary effort is normal.     Breath sounds: Normal breath sounds.  Abdominal:     Palpations: Abdomen is soft. There is mass (subcutaneous nodule 7.5 cm lateral to umbilicus). There is no hepatomegaly or splenomegaly.     Tenderness: There is no abdominal tenderness.  Neurological:     General: No focal deficit present.     Mental Status: She is alert and oriented to person, place, and time.  Psychiatric:        Mood and Affect: Mood normal.        Behavior: Behavior normal.      LABORATORY DATA:  I have reviewed the data as listed    Latest Ref Rng & Units 09/10/2021  6:30 AM 07/15/2021    6:08 AM 07/14/2021   10:48 AM  CBC  WBC 4.0 - 10.5 K/uL 10.5  10.6  13.0   Hemoglobin 12.0 - 15.0 g/dL 11.6  11.0  11.4   Hematocrit 36.0 - 46.0 % 36.0  35.6  36.1   Platelets 150 - 400 K/uL  270  236  263       Latest Ref Rng & Units 09/28/2021    8:22 AM 07/22/2021    3:13 PM 07/17/2021    4:56 AM  CMP  Glucose 70 - 99 mg/dL 184  222  104   BUN 8 - 27 mg/dL 30  37  35   Creatinine 0.57 - 1.00 mg/dL 1.61  1.78  1.73   Sodium 134 - 144 mmol/L 141  138  141   Potassium 3.5 - 5.2 mmol/L 4.9  5.1  4.2   Chloride 96 - 106 mmol/L 105  100  111   CO2 20 - 29 mmol/L _0 Calcium 8.7 - 10.3 mg/dL 9.5  9.5  9.0   Total Protein 6.0 - 8.5 g/dL 6.6     Total Bilirubin 0.0 - 1.2 mg/dL 0.3     Alkaline Phos 44 - 121 IU/L 101     AST 0 - 40 IU/L 11     ALT 0 - 32 IU/L 9       RADIOGRAPHIC STUDIES: I have personally reviewed the radiological images as listed and agreed with the findings in the report. CT RENAL BIOPSY  Result Date: 09/10/2021 INDICATION: 66 year old female referred for left renal mass biopsy EXAM: CT-GUIDED LEFT RENAL MASS BIOPSY TECHNIQUE: Multidetector CT imaging of the abdomen was performed following the standard protocol without IV contrast. RADIATION DOSE REDUCTION: This exam was performed according to the departmental dose-optimization program which includes automated exposure control, adjustment of the mA and/or kV according to patient size and/or use of iterative reconstruction technique. MEDICATIONS: None. ANESTHESIA/SEDATION: Moderate (conscious) sedation was employed during this procedure. A total of Versed 2.0 mg and Fentanyl 100 mcg was administered intravenously by the radiology nurse. Total intra-service moderate Sedation Time: 16 minutes. The patient's level of consciousness and vital signs were monitored continuously by radiology nursing throughout the procedure under my direct supervision. COMPLICATIONS: None PROCEDURE: Informed written consent was obtained from the patient after a thorough discussion of the procedural risks, benefits and alternatives. All questions were addressed. Maximal Sterile Barrier Technique was utilized including caps, mask,  sterile gowns, sterile gloves, sterile drape, hand hygiene and skin antiseptic. A timeout was performed prior to the initiation of the procedure. Patient positioned prone on the CT gantry table. Scout CT acquired for planning purposes. The patient was then prepped and draped in the usual sterile fashion, and 1% lidocaine was used for local anesthesia. Using CT guidance, a trans renal approach to the hilar mass was performed confirming needle position with incremental CT. Multiple 18 gauge core biopsy were performed. Gel-Foam was infused. Needle was removed and a final CT image was acquired. Patient tolerated the procedure well and remained hemodynamically stable throughout. No complications were encountered and no significant blood loss. IMPRESSION: Status post CT-guided biopsy of left renal mass. Signed, Dulcy Fanny. Nadene Rubins, RPVI [facility] Radiology Electronically Signed   By: Corrie Mckusick D.O.   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  Moderate to marked severity left-sided hydronephrosis.  Numerous bilateral nonobstructing renal calculi. - MRI abdomen (08/14/2021): Central lower pole left kidney 4 x 3.7 x 5.3 cm enhancing mass, extension into the proximal left ureter.  No renal vein involvement or evidence of abdominal metastatic disease.  Multiple small abdominal retroperitoneal lymph nodes, nonpathologic by size criteria.  Right abdominal wall subcutaneous nodule nonspecific but new since 07/13/2021. - Left kidney biopsy (09/10/2021): Clear-cell renal cell carcinoma, nuclear grade 2.  No tumor necrosis. - No B symptoms.  No gross hematuria.   Social/family history: - She lives at home with her  husband.  She retired after working in [employer]  She is current active smoker, 2 packs/day for 48 years. - Brother had throat cancer and was smoker.  Maternal uncle died of cancer.  Paternal uncle also had cancer.  Maternal grandmother had lung cancer.   PLAN:  Left clear cell renal cell carcinoma: - I have reviewed the images of the MRI with the patient and her husband. - I have reviewed pathology report in detail. - Given the new right abdominal wall subcutaneous nodule which is palpable and painless, I have recommended PET CT scan. - Because of the location of the tumor, she will most likely require radical nephrectomy/ureterectomy.  Given the right renal atrophy, she may likely end up on dialysis.  She has already seen Dr. Felipa Eth and Dr. Theador Hawthorne. - RTC after PET scan to discuss results.    All questions were answered. The patient knows to call the clinic with any problems, questions or concerns.   Grace Jack, MD, 10/01/21 9:01 AM  [place] [phone]   I, Thana Ates, am acting as a scribe for Dr. Derek Hess.  I, Grace Jack MD, have reviewed the above documentation for accuracy and completeness, and I agree with the above.

## 2021-10-01 NOTE — Patient Instructions (Signed)
[place] at [facility] Discharge Instructions  You were seen and examined today by Dr. Delton Coombes. Dr. Delton Coombes is a medical oncologist, meaning that he specializes in the treatment of cancer diagnoses. Dr. Delton Coombes discussed your past medical history, family history of cancers, and the events that led to you being here today.  You were referred to Dr. Delton Coombes due to a new diagnosis of Clear Cell Renal Cell Carcinoma. This is a type of cancer arising within your kidney.  On your recent MRI, it appears to be a Stage I Cancer. There was a nodule noted in your fatty tissue of the belly. Dr. Delton Coombes has recommended a PET scan to ensure that nodule is not cancer related.  A PET scan is a specialized CT scan that illuminates where there is cancer present within the body.  Follow-up with Dr. Delton Coombes as scheduled.  Thank you for choosing [place] at [facility] to provide your oncology and hematology care.  To afford each patient quality time with our provider, please arrive at least 15 minutes before your scheduled appointment time.   If you have a lab appointment with the [facility] please come in thru the Main Entrance and check in at the main information desk.  You need to re-schedule your appointment should you arrive 10 or more minutes late.  We strive to give you quality time with our providers, and arriving late affects you and other patients whose appointments are after yours.  Also, if you no show three or more times for appointments you may be dismissed from the clinic at the providers discretion.     Again, thank you for choosing [facility].  Our hope is that these requests will decrease the amount of time that you wait before being seen by our physicians.       _____________________________________________________________  Should you have questions after your visit to [facility], please contact  our office at [phone] and follow the prompts.  Our office hours are 8:00 a.m. and 4:30 p.m. Monday - Friday.  Please note that voicemails left after 4:00 p.m. may not be returned until the following business day.  We are closed weekends and major holidays.  You do have access to a nurse 24-7, just call the main number to the clinic [phone] and do not press any options, hold on the line and a nurse will answer the phone.    For prescription refill requests, have your pharmacy contact our office and allow 72 hours.

## 2021-10-01 NOTE — Progress Notes (Signed)
10/01/2021, 12:29 PM                                Endocrinology follow-up note   Subjective:    Patient ID: Grace Hess, female    DOB: 15-May-1955.  Grace Hess is being seen in follow-up for management of currently uncontrolled symptomatic diabetes requested by  Redmond School, MD.   Past Medical History:  Diagnosis Date   Anxiety    Arthritis    "might have a touch in my fingers" (08/02/2016)   CAD (coronary artery disease)    a. s/p DES to RCA in 07/2016 with residual mid-LAD stenosis   Cervicalgia    Chest pain, unspecified    GERD (gastroesophageal reflux disease)    Headache    "bad ones after my stroke in 2016; only have them when I get bad news now" (08/02/2016)   Heart murmur    Heavy smoker (more than 20 cigarettes per day)    Scheduled for LDCT screening 02/11/15   Hepatitis A    "related to Janine Limbo"   History of hiatal hernia    History of kidney stones    "no cysto/OR" (12/10/2014)   Hypercholesteremia    Hypertension    Obesity    Reflux esophagitis    Stroke [facility])    "they said I had a silent stroke a few months back/MRI" (12/10/2014)   TIA (transient ischemic attack) 11/28/2014   [name] 11/28/2014   Type II diabetes mellitus ([place])     Past Surgical History:  Procedure Laterality Date   ABDOMINAL AORTOGRAM N/A 08/02/2016   Procedure: Abdominal Aortogram;  Surgeon: Nelva Bush, MD;  Location: [place] [facility];  Service: Cardiovascular;  Laterality: N/A;   CAROTID STENT     CHOLECYSTECTOMY OPEN  1978   COLONOSCOPY  2010   single diverticulum in sigmoid colon   CORONARY ANGIOPLASTY WITH STENT PLACEMENT  08/02/2016   to RCA   CORONARY STENT INTERVENTION N/A 08/02/2016   Procedure: Coronary Stent Intervention;  Surgeon: Nelva Bush, MD;  Location: [place] [facility];  Service: Cardiovascular;  Laterality: N/A;  RCA   CYSTOSCOPY W/ RETROGRADES  Bilateral 07/15/2021   Procedure: CYSTOSCOPY WITH RETROGRADE PYELOGRAM;  Surgeon: Primus Bravo., MD;  Location: [facility];  Service: Urology;  Laterality: Bilateral;   CYSTOSCOPY WITH STENT PLACEMENT Left 07/15/2021   Procedure: CYSTOSCOPY WITH STENT PLACEMENT;  Surgeon: Primus Bravo., MD;  Location: [facility];  Service: Urology;  Laterality: Left;   ENDARTERECTOMY Left 12/02/2014   Procedure: ENDARTERECTOMY CAROTID;  Surgeon: Rosetta Posner, MD;  Location: [place];  Service: Vascular;  Laterality: Left;   LEFT HEART CATH AND CORONARY ANGIOGRAPHY N/A 08/02/2016   Procedure: Left Heart Cath and Coronary Angiography;  Surgeon: Nelva Bush, MD;  Location: [place] [facility];  Service: Cardiovascular;  Laterality: N/A;   LOWER EXTREMITY ANGIOGRAM  08/02/2016   LOWER EXTREMITY ANGIOGRAPHY N/A 08/02/2016   Procedure: Lower Extremity Angiography;  Surgeon: Nelva Bush, MD;  Location: [place] [facility];  Service: Cardiovascular;  Laterality: N/A;   VAGINAL HYSTERECTOMY  1991   "partial"    Social History   Socioeconomic History   Marital status: Married    Spouse name: Not on file   Number of children: 2   Years of education: 12   Highest education level: Not on file  Occupational History   Not on file  Tobacco Use   Smoking status: Every Day    Packs/day: 1.50    Years: 43.00    Total pack years: 64.50    Types: Cigarettes    Start date: 05/17/1974   Smokeless tobacco: Never  Vaping Use   Vaping Use: Never used  Substance and Sexual Activity   Alcohol use: No    Alcohol/week: 0.0 standard drinks of alcohol   Drug use: No   Sexual activity: Never  Other Topics Concern   Not on file  Social History Narrative   Patient does not drink caffeine.   Patient is right handed.    Social Determinants of Health   Financial Resource Strain: Not on file  Food Insecurity: Not on file  Transportation Needs: Not on file  Physical Activity: Not on file  Stress: Not on file   Social Connections: Not on file    Family History  Problem Relation Age of Onset   Congestive Heart Failure Mother    COPD Father    Cancer Brother    Cancer Brother     Outpatient Encounter Medications as of 10/01/2021  Medication Sig   acetaminophen (TYLENOL) 325 MG tablet Take 2 tablets (650 mg total) by mouth every 4 (four) hours as needed for headache or mild pain.   albuterol (VENTOLIN HFA) 108 (90 Base) MCG/ACT inhaler Inhale 1-2 puffs into the lungs every 4 (four) hours as needed for shortness of breath or wheezing.   ALPRAZolam (XANAX) 0.5 MG tablet Take 0.5 mg by mouth 2 (two) times daily as needed.   amLODipine (NORVASC) 5 MG tablet Take 1 tablet (5 mg total) by mouth daily.   aspirin 81 MG EC tablet Take by mouth.   benzonatate (TESSALON) 100 MG capsule Take 100 mg by mouth 4 (four) times daily as needed.   Biotin 5000 MCG CAPS Take 1 capsule by mouth daily.   Blood Glucose Monitoring Suppl (ACCU-CHEK GUIDE) w/Device KIT 1 Piece by Does not apply route as directed.   Cholecalciferol (VITAMIN D3) 125 MCG (5000 UT) CAPS Take 1 capsule by mouth once a week. Tuesday   clopidogrel (PLAVIX) 75 MG tablet Take 1 tablet (75 mg total) by mouth daily.   escitalopram (LEXAPRO) 20 MG tablet Take 20 mg by mouth daily.   gabapentin (NEURONTIN) 300 MG capsule Take 300 mg by mouth at bedtime.   glipiZIDE (GLUCOTROL XL) 2.5 MG 24 hr tablet TAKE 1 TABLET(2.5 MG) BY MOUTH DAILY WITH BREAKFAST   glucose blood (ACCU-CHEK GUIDE) test strip Use as instructed   metoprolol tartrate (LOPRESSOR) 25 MG tablet Take 1 tablet (25 mg total) by mouth 2 (two) times daily.   nystatin cream (MYCOSTATIN) Apply 1 application topically 2 (two) times daily as needed.   ondansetron (ZOFRAN) 4 MG tablet TAKE 1 TABLET(4 MG) BY MOUTH TWICE DAILY AS NEEDED FOR NAUSEA OR VOMITING   oxyCODONE-acetaminophen (PERCOCET/ROXICET) 5-325 MG tablet Take 1 tablet by mouth 3 (three) times daily as needed.   OZEMPIC, 0.25 OR 0.5  MG/DOSE, 2 MG/1.5ML SOPN INJECT 0.5MG INTO THE SKIN ONCE WEEKLY (Patient taking differently: Inject 0.5 mg into the skin  once a week.)   pantoprazole (PROTONIX) 40 MG tablet Take 1 tablet (40 mg total) by mouth daily before breakfast.   rosuvastatin (CRESTOR) 20 MG tablet TAKE 1 TABLET(20 MG) BY MOUTH DAILY (Patient taking differently: Take 20 mg by mouth daily.)   [DISCONTINUED] metFORMIN (GLUCOPHAGE) 500 MG tablet Take 500 mg by mouth 2 (two) times daily with a meal.   No facility-administered encounter medications on file as of 10/01/2021.    ALLERGIES: Allergies  Allergen Reactions   Codeine Shortness Of Breath   Latex Itching   Sulfa Antibiotics Other (See Comments)    Reaction:  Unknown     VACCINATION STATUS: Immunization History  Administered Date(s) Administered   Influenza,inj,Quad PF,6+ Mos 11/29/2014   Pneumococcal Polysaccharide-23 11/29/2014    Diabetes She presents for her follow-up diabetic visit. She has type 2 diabetes mellitus. Onset time: She was diagnosed at approximate age of 53 years. Her disease course has been fluctuating. There are no hypoglycemic associated symptoms. Pertinent negatives for hypoglycemia include no confusion, headaches, pallor or seizures. Associated symptoms include foot paresthesias. Pertinent negatives for diabetes include no chest pain, no fatigue, no polydipsia, no polyphagia and no polyuria. There are no hypoglycemic complications. Symptoms are stable. Diabetic complications include a CVA, heart disease and nephropathy. Risk factors for coronary artery disease include dyslipidemia, diabetes mellitus, family history, hypertension, tobacco exposure, sedentary lifestyle and post-menopausal. Current diabetic treatment includes oral agent (monotherapy) (and Ozempic 0.5 mg weekly). She is compliant with treatment most of the time. Her weight is fluctuating minimally. She is following a generally healthy diet. When asked about meal planning, she  reported none. She has not had a previous visit with a dietitian. She never participates in exercise. Her home blood glucose trend is fluctuating minimally. Her breakfast blood glucose range is generally 180-200 mg/dl. Her overall blood glucose range is 180-200 mg/dl. (She presents today with her meter, and logs showing above target glycemic profile overall.  Her POCT A1c today is 8.4%, increasing from last visit of 8.1%.  She has had a number of health issues since last visit, says she will most likely have to have kidney removed and having another scan by oncologist soon for suspicious spot in stomach.  This has made her more stressed.  Analysis of her meter shows 7-day average of 189 with 2 readings; 14-day average of 178 with 5 readings; 30-day average of 184 with 11 readings; and 90-day average of 170 with 34 readings.  She denies any s/s of hypoglycemia.) An ACE inhibitor/angiotensin II receptor blocker is being taken. She does not see a podiatrist.Eye exam is not current.  Hyperlipidemia This is a chronic problem. The current episode started more than 1 year ago. The problem is controlled. Recent lipid tests were reviewed and are normal. Exacerbating diseases include chronic renal disease and diabetes. Factors aggravating her hyperlipidemia include beta blockers, fatty foods and smoking. Pertinent negatives include no chest pain, myalgias or shortness of breath. Current antihyperlipidemic treatment includes statins. The current treatment provides mild improvement of lipids. Compliance problems include adherence to diet and adherence to exercise.  Risk factors for coronary artery disease include diabetes mellitus, dyslipidemia, hypertension, a sedentary lifestyle, post-menopausal and family history.  Hypertension This is a chronic problem. The current episode started more than 1 year ago. The problem has been resolved since onset. The problem is controlled. Pertinent negatives include no chest pain,  headaches, palpitations or shortness of breath. There are no associated agents to hypertension. Risk factors for coronary  artery disease include dyslipidemia, diabetes mellitus, sedentary lifestyle, smoking/tobacco exposure and post-menopausal state. Past treatments include ACE inhibitors and beta blockers. The current treatment provides moderate improvement. Compliance problems include exercise and diet.  Hypertensive end-organ damage includes kidney disease, CAD/MI and CVA. Identifiable causes of hypertension include chronic renal disease.    Review of systems  Constitutional: + minimally fluctuating weight,  current Body mass index is 24.37 kg/m. , no fatigue, no subjective hyperthermia, no subjective hypothermia Eyes: no blurry vision, no xerophthalmia ENT: no sore throat, no nodules palpated in throat, no dysphagia/odynophagia, no hoarseness Cardiovascular: no chest pain, no shortness of breath, no palpitations, no leg swelling Respiratory: no cough, no shortness of breath Gastrointestinal: no nausea/vomiting/diarrhea Musculoskeletal: no muscle/joint aches Skin: no rashes, no hyperemia Neurological: no tremors, no dizziness, + numbness/tingling to BLE (feet) Psychiatric: no depression, no anxiety  Objective:    BP 111/64   Pulse 68   Ht '5\' 6"'  (1.676 m)   Wt 151 lb (68.5 kg)   BMI 24.37 kg/m   Wt Readings from Last 3 Encounters:  10/01/21 151 lb (68.5 kg)  10/01/21 150 lb 12.8 oz (68.4 kg)  09/10/21 147 lb (66.7 kg)    BP Readings from Last 3 Encounters:  10/01/21 111/64  10/01/21 (!) 120/51  09/10/21 (!) 111/54     Physical Exam- Limited  Constitutional:  Body mass index is 24.37 kg/m. , not in acute distress, tearful during visit Eyes:  EOMI, no exophthalmos Neck: Supple Cardiovascular: RRR, no murmurs, rubs, or gallops, no edema Respiratory: Adequate breathing efforts, no crackles, rales, rhonchi, or wheezing Musculoskeletal: no gross deformities, strength intact  in all four extremities, no gross restriction of joint movements Skin:  no rashes, no hyperemia Neurological: no tremor with outstretched hands   CMP     Component Value Date/Time   NA 141 09/28/2021 0822   K 4.9 09/28/2021 0822   CL 105 09/28/2021 0822   CO2 20 09/28/2021 0822   GLUCOSE 184 (H) 09/28/2021 0822   GLUCOSE 104 (H) 07/17/2021 0456   BUN 30 (H) 09/28/2021 0822   CREATININE 1.61 (H) 09/28/2021 0822   CREATININE 1.38 (H) 08/20/2019 0719   CALCIUM 9.5 09/28/2021 0822   PROT 6.6 09/28/2021 0822   ALBUMIN 4.2 09/28/2021 0822   AST 11 09/28/2021 0822   ALT 9 09/28/2021 0822   ALKPHOS 101 09/28/2021 0822   BILITOT 0.3 09/28/2021 0822   GFRNONAA 32 (L) 07/17/2021 0456   GFRNONAA 40 (L) 08/20/2019 0719   GFRAA 51 (L) 03/26/2020 0927   GFRAA 47 (L) 08/20/2019 0719     Diabetic Labs (most recent): Lab Results  Component Value Date   HGBA1C 8.4 10/01/2021   HGBA1C 8.1 07/01/2021   HGBA1C 7.9 (A) 04/02/2021   MICROALBUR 26.7 08/20/2019    Lipid Panel     Component Value Date/Time   CHOL 161 03/30/2021 0803   TRIG 212 (H) 03/30/2021 0803   HDL 35 (L) 03/30/2021 0803   CHOLHDL 4.6 (H) 03/30/2021 0803   CHOLHDL 3.6 01/16/2019 0937   VLDL UNABLE TO CALCULATE IF TRIGLYCERIDE OVER 400 mg/dL 11/29/2014 0630   LDLCALC 90 03/30/2021 0803   LDLCALC 69 01/16/2019 0937   LABVLDL 36 03/30/2021 0803       Assessment & Plan:   1) DM type 2 causing vascular disease ([place])  - BELISA EICHHOLZ has currently uncontrolled symptomatic type 2 DM since  66 years of age.  She presents today with her meter, and logs showing  above target glycemic profile overall.  Her POCT A1c today is 8.4%, increasing from last visit of 8.1%.  She has had a number of health issues since last visit, says she will most likely have to have kidney removed and having another scan by oncologist soon for suspicious spot in stomach.  This has made her more stressed.  Analysis of her meter shows 7-day  average of 189 with 2 readings; 14-day average of 178 with 5 readings; 30-day average of 184 with 11 readings; and 90-day average of 170 with 34 readings.  She denies any s/s of hypoglycemia.  -Recent labs reviewed showing worsening kidney function- will refer to nephrology for further evaluation and treatment.  - I had a long discussion with her about the progressive nature of diabetes and the pathology behind its complications. -her diabetes is complicated by coronary artery disease, CVA, nephropathy, peripheral neuropathy, chronic heavy smoking, sedentary life and she remains at a high risk for more acute and chronic complications which include CAD, CVA, CKD, retinopathy, and neuropathy. These are all discussed in detail with her.  - Nutritional counseling repeated at each appointment due to patients tendency to fall back in to old habits.  - The patient admits there is a room for improvement in their diet and drink choices. -  Suggestion is made for the patient to avoid simple carbohydrates from their diet including Cakes, Sweet Desserts / Pastries, Ice Cream, Soda (diet and regular), Sweet Tea, Candies, Chips, Cookies, Sweet Pastries, Store Bought Juices, Alcohol in Excess of 1-2 drinks a day, Artificial Sweeteners, Coffee Creamer, and "Sugar-free" Products. This will help patient to have stable blood glucose profile and potentially avoid unintended weight gain.   - I encouraged the patient to switch to unprocessed or minimally processed complex starch and increased protein intake (animal or plant source), fruits, and vegetables.   - Patient is advised to stick to a routine mealtimes to eat 3 meals a day and avoid unnecessary snacks (to snack only to correct hypoglycemia).  - I have approached her with the following individualized plan to manage  her diabetes and patient agrees:   -She can continue her Glipizide 2.5 mg XL daily with breakfast and Ozempic 0.5 mg SQ weekly (cannot tolerate  higher doses due to smoking history and BMI less than 25) for now.  I advised her to work on diet and exercise to help with suspected prandial hyperglycemia as well as give up sodas.  -She is encouraged to continue monitoring blood glucose daily, before breakfast, and call the clinic if she has readings less than 70 or greater than 200 for 3 tests in a row.  - Specific targets for  A1c;  LDL, HDL, Triglycerides, and  Waist Circumference were discussed with the patient.  2) Blood Pressure /Hypertension:  Her blood pressure is controlled to target.  She is advised to continue Lisinopril 20 mg po daily and Metoprolol 25 mg po twice daily.   3) Lipids/Hyperlipidemia:   Her most recent lipid panel from 03/30/21 shows controlled LDL of 90 and elevated triglycerides of 212.  She is advised to continue Crestor 20 mg po daily at bedtime.  Side effects and precautions discussed with her.  She is advised to avoid fried foods and butter.    4)  Weight/Diet:  Her Body mass index is 24.37 kg/m..  she is not a candidate for more weight loss.   Exercise, and detailed carbohydrates information provided  -  detailed on discharge instructions.  5)  Chronic Care/Health Maintenance: -she is on ACEI/ARB and Statin medications and is encouraged to initiate and continue to follow up with Ophthalmology, Dentist, Podiatrist at least yearly or according to recommendations, and advised to quit smoking. I have recommended yearly flu vaccine and pneumonia vaccine at least every 5 years; moderate intensity exercise for up to 150 minutes weekly; and sleep for at least 7 hours a day.  - she is advised to maintain close follow up with Redmond School, MD for primary care needs, as well as her other providers for optimal and coordinated care.  I also entered referral for nephrology given her worsening kidney disease.      I spent 38 minutes in the care of the patient today including review of labs from [place], Lipids, Thyroid  Function, Hematology (current and previous including abstractions from other facilities); face-to-face time discussing  her blood glucose readings/logs, discussing hypoglycemia and hyperglycemia episodes and symptoms, medications doses, her options of short and long term treatment based on the latest standards of care / guidelines;  discussion about incorporating lifestyle medicine;  and documenting the encounter. Risk reduction counseling performed per USPSTF guidelines to reduce obesity and cardiovascular risk factors.     Please refer to Patient Instructions for Blood Glucose Monitoring and Insulin/Medications Dosing Guide"  in media tab for additional information. Please  also refer to " Patient Self Inventory" in the Media  tab for reviewed elements of pertinent patient history.  Shanon Ace participated in the discussions, expressed understanding, and voiced agreement with the above plans.  All questions were answered to her satisfaction. she is encouraged to contact clinic should she have any questions or concerns prior to her return visit.   Follow up plan: - Return in about 3 months (around 01/01/2022) for Diabetes F/U with A1c in office, No previsit labs, Bring meter and logs.  Rayetta Pigg, [facility] [address] [place], [place] [address] Phone: [phone] Fax: [phone]  10/01/2021, 12:29 PM

## 2021-10-06 ENCOUNTER — Other Ambulatory Visit: Payer: Self-pay | Admitting: Nurse Practitioner

## 2021-10-06 ENCOUNTER — Encounter (HOSPITAL_COMMUNITY): Payer: Self-pay

## 2021-10-06 DIAGNOSIS — L608 Other nail disorders: Secondary | ICD-10-CM | POA: Diagnosis not present

## 2021-10-06 DIAGNOSIS — L603 Nail dystrophy: Secondary | ICD-10-CM | POA: Diagnosis not present

## 2021-10-08 ENCOUNTER — Encounter (HOSPITAL_COMMUNITY)
Admission: RE | Admit: 2021-10-08 | Discharge: 2021-10-08 | Disposition: A | Discharge status: Home / Self Care | Payer: HMO | Source: Ambulatory Visit | Attending: Hematology | Admitting: Hematology

## 2021-10-08 DIAGNOSIS — C642 Malignant neoplasm of left kidney, except renal pelvis: Secondary | ICD-10-CM | POA: Diagnosis not present

## 2021-10-08 DIAGNOSIS — Z85528 Personal history of other malignant neoplasm of kidney: Secondary | ICD-10-CM | POA: Diagnosis not present

## 2021-10-08 MED ORDER — FLUDEOXYGLUCOSE F - 18 (FDG) INJECTION
7.8100 | Freq: Once | INTRAVENOUS | Status: AC | PRN
Start: 2021-10-08 — End: 2021-10-08
  Administered 2021-10-08: 7.81 via INTRAVENOUS

## 2021-10-14 ENCOUNTER — Inpatient Hospital Stay: Payer: HMO | Attending: Hematology | Admitting: Hematology

## 2021-10-14 VITALS — BP 127/61 | HR 67 | Temp 98.7°F | Resp 20

## 2021-10-14 DIAGNOSIS — C642 Malignant neoplasm of left kidney, except renal pelvis: Secondary | ICD-10-CM | POA: Diagnosis not present

## 2021-10-14 DIAGNOSIS — F17210 Nicotine dependence, cigarettes, uncomplicated: Secondary | ICD-10-CM | POA: Diagnosis not present

## 2021-10-14 NOTE — Progress Notes (Signed)
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, a 66 y.o. 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 Grace Hess was last seen on 10/01/2021.   Today she reports feeling well. She reports dysuria for which she takes AZO; she otherwise denies current pain. She reports occasional nausea. Her appetite is good.   REVIEW OF SYSTEMS:  Review of Systems  Constitutional:  Negative for appetite change and fatigue.  Gastrointestinal:  Positive for nausea. Negative for constipation, diarrhea and vomiting.  Genitourinary:  Positive for dysuria.   Musculoskeletal:  Negative for arthralgias and myalgias.  Neurological:  Negative for dizziness, headaches and light-headedness.  Psychiatric/Behavioral:  Negative for sleep disturbance.   All other systems reviewed and are negative.   PAST MEDICAL/SURGICAL HISTORY:  Past Medical History:  Diagnosis Date   Anxiety    Arthritis    "might have a touch in my fingers" (08/02/2016)   CAD (coronary artery disease)    a. s/p DES to RCA in 07/2016 with residual mid-LAD stenosis   Cervicalgia    Chest pain, unspecified    GERD (gastroesophageal reflux disease)    Headache    "bad ones after my stroke in 2016; only have them when I get bad news now" (08/02/2016)   Heart murmur    Heavy smoker (more than 20 cigarettes per day)    Scheduled for LDCT screening 02/11/15   Hepatitis A    "related  to Janine Limbo"   History of hiatal hernia    History of kidney stones    "no cysto/OR" (12/10/2014)   Hypercholesteremia    Hypertension    Obesity    Reflux esophagitis    Stroke [facility])    "they said I had a silent stroke a few months back/MRI" (12/10/2014)   TIA (transient ischemic attack) 11/28/2014   [name] 11/28/2014   Type II diabetes mellitus ([place])    Past Surgical History:  Procedure Laterality Date   ABDOMINAL AORTOGRAM N/A 08/02/2016   Procedure: Abdominal Aortogram;  Surgeon: Nelva Bush, MD;  Location: [place] [facility];  Service: Cardiovascular;  Laterality: N/A;   CAROTID STENT     CHOLECYSTECTOMY OPEN  1978   COLONOSCOPY  2010   single diverticulum in sigmoid colon   CORONARY ANGIOPLASTY WITH STENT PLACEMENT  08/02/2016   to RCA   CORONARY STENT INTERVENTION N/A 08/02/2016   Procedure: Coronary Stent Intervention;  Surgeon: Nelva Bush, MD;  Location: [place] [facility];  Service: Cardiovascular;  Laterality: N/A;  RCA   CYSTOSCOPY W/ RETROGRADES Bilateral 07/15/2021   Procedure: CYSTOSCOPY WITH RETROGRADE PYELOGRAM;  Surgeon: Primus Bravo., MD;  Location: [facility];  Service: Urology;  Laterality: Bilateral;   CYSTOSCOPY WITH STENT PLACEMENT Left 07/15/2021   Procedure: CYSTOSCOPY WITH STENT PLACEMENT;  Surgeon: Primus Bravo., MD;  Location: [facility];  Service: Urology;  Laterality: Left;   ENDARTERECTOMY Left 12/02/2014   Procedure: ENDARTERECTOMY CAROTID;  Surgeon: Arvilla Meres  Early, MD;  Location: [facility];  Service: Vascular;  Laterality: Left;   LEFT HEART CATH AND CORONARY ANGIOGRAPHY N/A 08/02/2016   Procedure: Left Heart Cath and Coronary Angiography;  Surgeon: Nelva Bush, MD;  Location: [facility];  Service: Cardiovascular;  Laterality: N/A;   LOWER EXTREMITY ANGIOGRAM  08/02/2016   LOWER EXTREMITY ANGIOGRAPHY N/A 08/02/2016   Procedure: Lower Extremity Angiography;  Surgeon: Nelva Bush, MD;  Location: [facility];  Service:  Cardiovascular;  Laterality: N/A;   VAGINAL HYSTERECTOMY  1991   "partial"    SOCIAL HISTORY:  Social History   Socioeconomic History   Marital status: Married    Spouse name: Not on file   Number of children: 2   Years of education: 12   Highest education level: Not on file  Occupational History   Not on file  Tobacco Use   Smoking status: Every Day    Packs/day: 1.50    Years: 43.00    Total pack years: 64.50    Types: Cigarettes    Start date: 05/17/1974   Smokeless tobacco: Never  Vaping Use   Vaping Use: Never used  Substance and Sexual Activity   Alcohol use: No    Alcohol/week: 0.0 standard drinks of alcohol   Drug use: No   Sexual activity: Never  Other Topics Concern   Not on file  Social History Narrative   Patient does not drink caffeine.   Patient is right handed.    Social Determinants of Health   Financial Resource Strain: Not on file  Food Insecurity: Not on file  Transportation Needs: Not on file  Physical Activity: Not on file  Stress: Not on file  Social Connections: Not on file  Intimate Partner Violence: Not on file    FAMILY HISTORY:  Family History  Problem Relation Age of Onset   Congestive Heart Failure Mother    COPD Father    Cancer Brother    Cancer Brother     CURRENT MEDICATIONS:  Current Outpatient Medications  Medication Sig Dispense Refill   acetaminophen (TYLENOL) 325 MG tablet Take 2 tablets (650 mg total) by mouth every 4 (four) hours as needed for headache or mild pain.     albuterol (VENTOLIN HFA) 108 (90 Base) MCG/ACT inhaler Inhale 1-2 puffs into the lungs every 4 (four) hours as needed for shortness of breath or wheezing.     ALPRAZolam (XANAX) 0.5 MG tablet Take 0.5 mg by mouth 2 (two) times daily as needed.     amLODipine (NORVASC) 5 MG tablet Take 1 tablet (5 mg total) by mouth daily. 30 tablet 1   aspirin 81 MG EC tablet Take by mouth.     benzonatate (TESSALON) 100 MG capsule Take 100 mg by mouth 4 (four)  times daily as needed.     Biotin 5000 MCG CAPS Take 1 capsule by mouth daily.     Blood Glucose Monitoring Suppl (ACCU-CHEK GUIDE) w/Device KIT 1 Piece by Does not apply route as directed. 1 kit 0   Cholecalciferol (VITAMIN D3) 125 MCG (5000 UT) CAPS Take 1 capsule by mouth once a week. Tuesday     clopidogrel (PLAVIX) 75 MG tablet Take 1 tablet (75 mg total) by mouth daily. 30 tablet 3   escitalopram (LEXAPRO) 20 MG tablet Take 20 mg by mouth daily.     gabapentin (NEURONTIN) 300 MG capsule Take 300 mg by mouth at bedtime.     glipiZIDE (GLUCOTROL XL) 2.5 MG 24  hr tablet TAKE 1 TABLET(2.5 MG) BY MOUTH DAILY WITH BREAKFAST 30 tablet 2   glucose blood (ACCU-CHEK GUIDE) test strip Use as instructed 150 each 2   metoprolol tartrate (LOPRESSOR) 25 MG tablet Take 1 tablet (25 mg total) by mouth 2 (two) times daily. 180 tablet 3   nystatin cream (MYCOSTATIN) Apply 1 application topically 2 (two) times daily as needed.  5   ondansetron (ZOFRAN) 4 MG tablet TAKE 1 TABLET(4 MG) BY MOUTH TWICE DAILY AS NEEDED FOR NAUSEA OR VOMITING 20 tablet 0   oxyCODONE-acetaminophen (PERCOCET/ROXICET) 5-325 MG tablet Take 1 tablet by mouth 3 (three) times daily as needed.     OZEMPIC, 0.25 OR 0.5 MG/DOSE, 2 MG/1.5ML SOPN INJECT 0.5MG INTO THE SKIN ONCE WEEKLY (Patient taking differently: Inject 0.5 mg into the skin once a week.) 3 mL 2   pantoprazole (PROTONIX) 40 MG tablet Take 1 tablet (40 mg total) by mouth daily before breakfast. 90 tablet 3   rosuvastatin (CRESTOR) 20 MG tablet TAKE 1 TABLET(20 MG) BY MOUTH DAILY (Patient taking differently: Take 20 mg by mouth daily.) 90 tablet 3   No current facility-administered medications for this visit.    ALLERGIES:  Allergies  Allergen Reactions   Codeine Shortness Of Breath   Latex Itching   Sulfa Antibiotics Other (See Comments)    Reaction:  Unknown     PHYSICAL EXAM:  Performance status (ECOG): 1 - Symptomatic but completely ambulatory  There were no vitals  filed for this visit. Wt Readings from Last 3 Encounters:  10/01/21 151 lb (68.5 kg)  10/01/21 150 lb 12.8 oz (68.4 kg)  09/10/21 147 lb (66.7 kg)   Physical Exam Vitals reviewed.  Constitutional:      Appearance: Normal appearance.  Cardiovascular:     Rate and Rhythm: Normal rate and regular rhythm.     Pulses: Normal pulses.     Heart sounds: Normal heart sounds.  Pulmonary:     Effort: Pulmonary effort is normal.     Breath sounds: Normal breath sounds.  Neurological:     General: No focal deficit present.     Mental Status: She is alert and oriented to person, place, and time.  Psychiatric:        Mood and Affect: Mood normal.        Behavior: Behavior normal.      LABORATORY DATA:  I have reviewed the labs as listed.     Latest Ref Rng & Units 09/10/2021    6:30 AM 07/15/2021    6:08 AM 07/14/2021   10:48 AM  CBC  WBC 4.0 - 10.5 K/uL 10.5  10.6  13.0   Hemoglobin 12.0 - 15.0 g/dL 11.6  11.0  11.4   Hematocrit 36.0 - 46.0 % 36.0  35.6  36.1   Platelets 150 - 400 K/uL 270  236  263       Latest Ref Rng & Units 09/28/2021    8:22 AM 07/22/2021    3:13 PM 07/17/2021    4:56 AM  CMP  Glucose 70 - 99 mg/dL 184  222  104   BUN 8 - 27 mg/dL 30  37  35   Creatinine 0.57 - 1.00 mg/dL 1.61  1.78  1.73   Sodium 134 - 144 mmol/L 141  138  141   Potassium 3.5 - 5.2 mmol/L 4.9  5.1  4.2   Chloride 96 - 106 mmol/L 105  100  111   CO2 20 - 29 mmol/L  '20  19  21   ' Calcium 8.7 - 10.3 mg/dL 9.5  9.5  9.0   Total Protein 6.0 - 8.5 g/dL 6.6     Total Bilirubin 0.0 - 1.2 mg/dL 0.3     Alkaline Phos 44 - 121 IU/L 101     AST 0 - 40 IU/L 11     ALT 0 - 32 IU/L 9       DIAGNOSTIC IMAGING:  I have independently reviewed the scans and discussed with the patient. NM PET Image Initial (PI) Skull Base To Thigh (F-18 FDG)  Result Date: 10/08/2021 CLINICAL DATA:  Initial Treatment strategy for History of kidney cancer, active surveillance, subcu nodules on recent MRI. EXAM: NUCLEAR  MEDICINE PET SKULL BASE TO THIGH TECHNIQUE: 7.81 mCi F-18 FDG was injected intravenously. Full-ring PET imaging was performed from the skull base to thigh after the radiotracer. CT data was obtained and used for attenuation correction and anatomic localization. Fasting blood glucose: 210 mg/dl COMPARISON:  Multiple priors including MRI abdomen August 14, 2021 FINDINGS: Mediastinal blood pool activity: SUV max 2.5 Liver activity: SUV max NA NECK: No hypermetabolic cervical adenopathy. Incidental CT findings: none CHEST: Minimally metabolic prominent mediastinal lymph nodes are similar in size dating back to September 17, 2015 for instance a precarinal lymph node measuring 5 mm on image 92/3 is unchanged in size with a max SUV of 2.2. No hypermetabolic pulmonary nodules or masses. Incidental CT findings: Solid 3 mm right middle lobe pulmonary nodule on image 58/7 is stable dating back to February 11, 2015 consistent with a benign etiology. Motion degraded examination reveals no suspicious pulmonary nodules or masses. Aortic atherosclerosis. Coronary artery calcifications. Calcifications of the mitral annulus and aortic valve. ABDOMEN/PELVIS: Hypermetabolic central left lower pole renal mass measures 3.8 cm on image 176/3 with a max SUV of 4.3. No abnormal hypermetabolic activity within the liver, pancreas, adrenal glands, or spleen. No hypermetabolic lymph nodes in the abdomen or pelvis. Incidental CT findings: Left ureteral double-J pigtail catheter with the proximal loop in the renal pelvis and distal loop in the urinary bladder, no hydronephrosis. Nonobstructive right-sided renal stones. Gallbladder surgically absent. Aortic atherosclerosis. SKELETON: Decreased size of the soft tissue nodule in the right anterior abdominal wall which does not demonstrate abnormal FDG avidity but is below the resolution of PET-CT measuring 2 mm on image 184/3 previously 6 mm on MRI abdomen August 14, 2021. No focal hypermetabolic activity to  suggest skeletal metastases. Incidental CT findings: Multilevel degenerative changes spine. IMPRESSION: 1. Hypermetabolic central left lower pole renal mass is consistent with patient's known primary renal neoplasm. 2. Decreased size of the soft tissue nodule in the anterior abdominal wall which does not demonstrate abnormal FDG avidity but is below the resolution of PET-CT now measuring 2 mm. 3. No scintigraphic evidence of hypermetabolic metastatic disease in the chest, abdomen or pelvis. 4. Minimally metabolic prominent mediastinal lymph nodes are stable back to at least CT September 17, 2015 favored to reflect a reactive process. 5.  Aortic Atherosclerosis (ICD10-I70.0). Electronically Signed   By: Dahlia Bailiff M.D.   On: 10/08/2021 16:36     ASSESSMENT:  Left kidney clear cell renal cell carcinoma (T3AN0): - CT renal stone study (07/13/2021): Compensated hypertrophy of the left kidney, 5.4 x 3.7 x 4.4 cm lobulated mildly hyperdense soft tissue mass within the anteromedial aspect of the mid to lower left kidney.  Moderate to marked severity left-sided hydronephrosis.  Numerous bilateral nonobstructing renal calculi. - MRI abdomen (08/14/2021):  Central lower pole left kidney 4 x 3.7 x 5.3 cm enhancing mass, extension into the proximal left ureter.  No renal vein involvement or evidence of abdominal metastatic disease.  Multiple small abdominal retroperitoneal lymph nodes, nonpathologic by size criteria.  Right abdominal wall subcutaneous nodule nonspecific but new since 07/13/2021. - Left kidney biopsy (09/10/2021): Clear-cell renal cell carcinoma, nuclear grade 2.  No tumor necrosis. - No B symptoms.  No gross hematuria.    Social/family history: - She lives at home with her husband.  She retired after working in [employer]  She is current active smoker, 2 packs/day for 48 years. - Brother had throat cancer and was smoker.  Maternal uncle died of cancer.  Paternal uncle also had cancer.  Maternal grandmother  had lung cancer.   PLAN:  Left clear cell renal cell carcinoma: - I have reviewed PET CT scan (10/08/2021): Hypermetabolic central left lower pole renal mass.  Decreased size of this cutaneous nodule in the right anterior abdominal wall, now measuring 2 millimeters, below resolution of PET scan.  No other hypermetabolic disease. - She has an appointment to see Dr. Junious Silk on 10/30/2021 for nephrectomy. - I have recommended follow-up 4 to 6 weeks after surgery.   Orders placed this encounter:  No orders of the defined types were placed in this encounter.    Derek Jack, MD [place] [phone]   I, Thana Ates, am acting as a scribe for Dr. Derek Jack.  I, Derek Jack MD, have reviewed the above documentation for accuracy and completeness, and I agree with the above.

## 2021-10-26 ENCOUNTER — Telehealth: Payer: Self-pay | Admitting: Nurse Practitioner

## 2021-10-26 NOTE — Telephone Encounter (Signed)
Pt said she can not afford her ozempic. Please advise

## 2021-10-27 DIAGNOSIS — C642 Malignant neoplasm of left kidney, except renal pelvis: Secondary | ICD-10-CM | POA: Diagnosis not present

## 2021-10-27 DIAGNOSIS — L603 Nail dystrophy: Secondary | ICD-10-CM | POA: Diagnosis not present

## 2021-10-27 NOTE — Telephone Encounter (Signed)
Ok, well we will have to look at other options.  I assume that all the other similar medications will be similarly priced.  For now, its best to just stay on the Glipizide only.  We may need to add long acting insulin treatment, which can be adjusted easily to get her glucose in range.  We can discuss further at next visit.

## 2021-10-27 NOTE — Telephone Encounter (Signed)
Sent pt a mychart message. 

## 2021-10-28 ENCOUNTER — Other Ambulatory Visit: Payer: Self-pay | Admitting: Urology

## 2021-10-28 ENCOUNTER — Telehealth: Payer: Self-pay | Admitting: Cardiology

## 2021-10-28 NOTE — Telephone Encounter (Signed)
   Name: Grace Hess  DOB: 23-Feb-1956  MRN: [id number]  Primary Cardiologist: Carlyle Dolly, MD   Preoperative team, please contact this patient and set up a phone call appointment for further preoperative risk assessment. Please obtain consent and complete medication review. Thank you for your help.  I confirm that guidance regarding antiplatelet and oral anticoagulation therapy has been completed and, if necessary, noted below.  Per office protocol, from a cardiac perspective, if patient is without any new symptoms or concerns at the time of their virtual visit, she may hold Plavix for 5 days prior to procedure. However, patient also on Plavix for history of CVA. Would also recommend input from PCP/neurology regarding Plavix hold prior to procedure.   Preop coverage team, please advise patient/requesting party of need for additional recommendations for holding Plavix.   Thank you.   Lenna Sciara, NP 10/28/2021, 3:57 PM [place]

## 2021-10-28 NOTE — Telephone Encounter (Signed)
   Pre-operative Risk Assessment    Patient Name: Grace Hess  DOB: 02-02-1956 MRN: [id number]      Request for Surgical Clearance    Procedure:   Left Lataroscopic Radical Nethrectomy  Date of Surgery:  Clearance 11/25/21                                 Surgeon:  Dr. Emogene Morgan Group or Practice Name:  [facility] Phone number:  [phone] Fax number:  [phone]   Type of Clearance Requested:   - Pharmacy:  Hold Clopidogrel (Plavix)     Type of Anesthesia:  General    Additional requests/questions:  Please advise surgeon/provider what medications should be held.  Signed, Belisicia T Harris   10/28/2021, 11:37 AM

## 2021-10-29 ENCOUNTER — Telehealth: Payer: Self-pay | Admitting: *Deleted

## 2021-10-29 ENCOUNTER — Other Ambulatory Visit (INDEPENDENT_AMBULATORY_CARE_PROVIDER_SITE_OTHER): Payer: Self-pay

## 2021-10-29 DIAGNOSIS — R112 Nausea with vomiting, unspecified: Secondary | ICD-10-CM

## 2021-10-29 MED ORDER — ONDANSETRON HCL 4 MG PO TABS: ORAL_TABLET | ORAL | 0 refills | Status: DC

## 2021-10-29 NOTE — Telephone Encounter (Signed)
Pt has been scheduled for a tele visit, 11/05/21 9:20.

## 2021-10-29 NOTE — Telephone Encounter (Signed)
Pt has been scheduled for a tele visit, 11/05/21 9:20.  Consent on file / medications reconciled.    Patient Consent for Virtual Visit        Grace Hess has provided verbal consent on 10/29/2021 for a virtual visit (video or telephone).   CONSENT FOR VIRTUAL VISIT FOR:  Grace Hess  By participating in this virtual visit I agree to the following:  I hereby voluntarily request, consent and authorize [facility] and its employed or contracted physicians, physician assistants, nurse practitioners or other licensed health care professionals (the Practitioner), to provide me with telemedicine health care services (the "Services") as deemed necessary by the treating Practitioner. I acknowledge and consent to receive the Services by the Practitioner via telemedicine. I understand that the telemedicine visit will involve communicating with the Practitioner through live audiovisual communication technology and the disclosure of certain medical information by electronic transmission. I acknowledge that I have been given the opportunity to request an in-person assessment or other available alternative prior to the telemedicine visit and am voluntarily participating in the telemedicine visit.  I understand that I have the right to withhold or withdraw my consent to the use of telemedicine in the course of my care at any time, without affecting my right to future care or treatment, and that the Practitioner or I may terminate the telemedicine visit at any time. I understand that I have the right to inspect all information obtained and/or recorded in the course of the telemedicine visit and may receive copies of available information for a reasonable fee.  I understand that some of the potential risks of receiving the Services via telemedicine include:  Delay or interruption in medical evaluation due to technological equipment failure or disruption; Information transmitted may not be sufficient (e.g.  poor resolution of images) to allow for appropriate medical decision making by the Practitioner; and/or  In rare instances, security protocols could fail, causing a breach of personal health information.  Furthermore, I acknowledge that it is my responsibility to provide information about my medical history, conditions and care that is complete and accurate to the best of my ability. I acknowledge that Practitioner's advice, recommendations, and/or decision may be based on factors not within their control, such as incomplete or inaccurate data provided by me or distortions of diagnostic images or specimens that may result from electronic transmissions. I understand that the practice of medicine is not an exact science and that Practitioner makes no warranties or guarantees regarding treatment outcomes. I acknowledge that a copy of this consent can be made available to me via my patient portal ([facility]), or I can request a printed copy by calling the office of [facility].    I understand that my insurance will be billed for this visit.   I have read or had this consent read to me. I understand the contents of this consent, which adequately explains the benefits and risks of the Services being provided via telemedicine.  I have been provided ample opportunity to ask questions regarding this consent and the Services and have had my questions answered to my satisfaction. I give my informed consent for the services to be provided through the use of telemedicine in my medical care

## 2021-11-05 ENCOUNTER — Other Ambulatory Visit: Payer: Self-pay | Admitting: *Deleted

## 2021-11-05 ENCOUNTER — Ambulatory Visit (INDEPENDENT_AMBULATORY_CARE_PROVIDER_SITE_OTHER): Payer: HMO | Admitting: Physician Assistant

## 2021-11-05 DIAGNOSIS — Z01810 Encounter for preprocedural cardiovascular examination: Secondary | ICD-10-CM | POA: Diagnosis not present

## 2021-11-05 NOTE — Progress Notes (Signed)
Virtual Visit via Telephone Note   Because of Yannely Kintzel Noffsinger's co-morbid illnesses, she is at least at moderate risk for complications without adequate follow up.  This format is felt to be most appropriate for this patient at this time.  The patient did not have access to video technology/had technical difficulties with video requiring transitioning to audio format only (telephone).  All issues noted in this document were discussed and addressed.  No physical exam could be performed with this format.  Please refer to the patient's chart for her consent to telehealth for [facility]  Evaluation Performed:  Preoperative cardiovascular risk assessment _____________   Date:  11/05/2021   Patient ID:  Roshaunda, Starkey 1955/08/18, MRN [id number] Patient Location:  Home Provider location:   Office  Primary Care Provider:  Redmond School, MD Primary Cardiologist:  Carlyle Dolly, MD  Chief Complaint / Patient Profile   66 y.o. y/o female with a h/o CAD (status post DES to RCA in 07/2016 with residual mid LAD stenosis), PAD (known CTO to the left SFA), carotid artery disease (status post left CEA), hypertension, hyperlipidemia, type 2 diabetes mellitus,  CVA and tobacco use who is pending left laparoscopic radical nephrectomy and presents today for telephonic preoperative cardiovascular risk assessment.  Past Medical History    Past Medical History:  Diagnosis Date   Anxiety    Arthritis    "might have a touch in my fingers" (08/02/2016)   CAD (coronary artery disease)    a. s/p DES to RCA in 07/2016 with residual mid-LAD stenosis   Cervicalgia    Chest pain, unspecified    GERD (gastroesophageal reflux disease)    Headache    "bad ones after my stroke in 2016; only have them when I get bad news now" (08/02/2016)   Heart murmur    Heavy smoker (more than 20 cigarettes per day)    Scheduled for LDCT screening 02/11/15   Hepatitis A    "related to Janine Limbo"   History of  hiatal hernia    History of kidney stones    "no cysto/OR" (12/10/2014)   Hypercholesteremia    Hypertension    Obesity    Reflux esophagitis    Stroke [facility])    "they said I had a silent stroke a few months back/MRI" (12/10/2014)   TIA (transient ischemic attack) 11/28/2014   [name] 11/28/2014   Type II diabetes mellitus ([place])    Past Surgical History:  Procedure Laterality Date   ABDOMINAL AORTOGRAM N/A 08/02/2016   Procedure: Abdominal Aortogram;  Surgeon: Nelva Bush, MD;  Location: [place] [facility];  Service: Cardiovascular;  Laterality: N/A;   CAROTID STENT     CHOLECYSTECTOMY OPEN  1978   COLONOSCOPY  2010   single diverticulum in sigmoid colon   CORONARY ANGIOPLASTY WITH STENT PLACEMENT  08/02/2016   to RCA   CORONARY STENT INTERVENTION N/A 08/02/2016   Procedure: Coronary Stent Intervention;  Surgeon: Nelva Bush, MD;  Location: [place] [facility];  Service: Cardiovascular;  Laterality: N/A;  RCA   CYSTOSCOPY W/ RETROGRADES Bilateral 07/15/2021   Procedure: CYSTOSCOPY WITH RETROGRADE PYELOGRAM;  Surgeon: Primus Bravo., MD;  Location: [facility];  Service: Urology;  Laterality: Bilateral;   CYSTOSCOPY WITH STENT PLACEMENT Left 07/15/2021   Procedure: CYSTOSCOPY WITH STENT PLACEMENT;  Surgeon: Primus Bravo., MD;  Location: [facility];  Service: Urology;  Laterality: Left;   ENDARTERECTOMY Left 12/02/2014   Procedure: ENDARTERECTOMY CAROTID;  Surgeon: Rosetta Posner,  MD;  Location: [place];  Service: Vascular;  Laterality: Left;   LEFT HEART CATH AND CORONARY ANGIOGRAPHY N/A 08/02/2016   Procedure: Left Heart Cath and Coronary Angiography;  Surgeon: Nelva Bush, MD;  Location: [place] [facility];  Service: Cardiovascular;  Laterality: N/A;   LOWER EXTREMITY ANGIOGRAM  08/02/2016   LOWER EXTREMITY ANGIOGRAPHY N/A 08/02/2016   Procedure: Lower Extremity Angiography;  Surgeon: Nelva Bush, MD;  Location: [place] [facility];  Service: Cardiovascular;  Laterality:  N/A;   VAGINAL HYSTERECTOMY  1991   "partial"    Allergies  Allergies  Allergen Reactions   Codeine Shortness Of Breath   Latex Itching   Sulfa Antibiotics Other (See Comments)    Reaction:  Unknown     History of Present Illness    AMANDA STEUART is a 66 y.o. female who presents via audio/video conferencing for a telehealth visit today.  Pt was last seen in cardiology clinic on 02/11/2021 by Bernerd Pho, [place].  At that time SONNIA STRONG was doing well with occasional chest pain which was relieved with 1 nitro.  The patient is now pending procedure as outlined above. Since her last visit, she has been doing okay.  She has stable angina.  She was seen in the ED back in May for suppose it kidney stones when they ended up finding a mass.  Her chest pain has not gotten any worse or more frequent than her last appointment.  She was taken off of nitroglycerin in May and occasionally has had to take an aspirin.  She does have some stable shortness of breath from smoking but it has not gotten any worse.  The surgeon is requesting a Plavix hold for the procedure.  From a cardiac standpoint, it is okay to hold Plavix 5 days prior to the procedure.  However, patient also has history of CVA so would recommend input from PCP/neurology as well.  Home Medications    Prior to Admission medications   Medication Sig Start Date End Date Taking? Authorizing Provider  acetaminophen (TYLENOL) 325 MG tablet Take 2 tablets (650 mg total) by mouth every 4 (four) hours as needed for headache or mild pain. 08/03/16   Erlene Quan, PA-C  albuterol (VENTOLIN HFA) 108 (90 Base) MCG/ACT inhaler Inhale 1-2 puffs into the lungs every 4 (four) hours as needed for shortness of breath or wheezing.    [provider]  ALPRAZolam Duanne Moron) 0.5 MG tablet Take 0.5 mg by mouth 2 (two) times daily as needed. 03/20/19   [provider]  amLODipine (NORVASC) 5 MG tablet Take 1 tablet (5 mg total) by mouth  daily. 07/17/21   Orson Eva, MD  benzonatate (TESSALON) 100 MG capsule Take 100 mg by mouth 4 (four) times daily as needed. 03/03/21   [provider]  Biotin 5000 MCG CAPS Take 1 capsule by mouth daily.    [provider]  Blood Glucose Monitoring Suppl (ACCU-CHEK GUIDE) w/Device KIT 1 Piece by Does not apply route as directed. 08/23/19   Cassandria Anger, MD  Cholecalciferol (VITAMIN D3) 125 MCG (5000 UT) CAPS Take 1 capsule by mouth once a week. Tuesday    [provider]  clopidogrel (PLAVIX) 75 MG tablet Take 1 tablet (75 mg total) by mouth daily. 12/03/14   Rai, Ripudeep K, MD  escitalopram (LEXAPRO) 20 MG tablet Take 20 mg by mouth daily. 01/01/21   [provider]  gabapentin (NEURONTIN) 300 MG capsule Take 300 mg by  mouth at bedtime.    [provider]  glipiZIDE (GLUCOTROL XL) 2.5 MG 24 hr tablet TAKE 1 TABLET(2.5 MG) BY MOUTH DAILY WITH BREAKFAST 10/06/21   Brita Romp, NP  glucose blood (ACCU-CHEK GUIDE) test strip Use as instructed 08/23/19   Cassandria Anger, MD  metoprolol tartrate (LOPRESSOR) 25 MG tablet Take 1 tablet (25 mg total) by mouth 2 (two) times daily. 03/19/21   Strader, Fransisco Hertz, PA-C  nystatin cream (MYCOSTATIN) Apply 1 application topically 2 (two) times daily as needed. 07/10/16   [provider]  ondansetron (ZOFRAN) 4 MG tablet TAKE 1 TABLET(4 MG) BY MOUTH TWICE DAILY AS NEEDED FOR NAUSEA OR VOMITING 10/29/21   Montez Morita, Quillian Quince, MD  oxyCODONE-acetaminophen (PERCOCET/ROXICET) 5-325 MG tablet Take 1 tablet by mouth 3 (three) times daily as needed. 08/20/19   [provider]  OZEMPIC, 0.25 OR 0.5 MG/DOSE, 2 MG/1.5ML SOPN INJECT 0.5MG INTO THE SKIN ONCE WEEKLY Patient taking differently: Inject 0.5 mg into the skin once a week. 07/08/21   Brita Romp, NP  pantoprazole (PROTONIX) 40 MG tablet Take 1 tablet (40 mg total) by mouth daily before breakfast. 01/13/21   Carlan, Chelsea L, NP   rosuvastatin (CRESTOR) 20 MG tablet TAKE 1 TABLET(20 MG) BY MOUTH DAILY Patient taking differently: Take 20 mg by mouth daily. 02/23/21   Arnoldo Lenis, MD    Physical Exam    Vital Signs:  KISSIE ZIOLKOWSKI does not have vital signs available for review today.  Given telephonic nature of communication, physical exam is limited. AAOx3. NAD. Normal affect.  Speech and respirations are unlabored.  Accessory Clinical Findings    None  Assessment & Plan    1.  Preoperative Cardiovascular Risk Assessment:  Click Here to Calculate RCRI      :[id number]}   Ms. Cribb perioperative risk of a major cardiac event is 11% according to the Revised Cardiac Risk Index (RCRI).  Therefore, she is at high risk for perioperative complications.   Her functional capacity is good at 5.07 METs according to the Duke Activity Status Index (DASI). Recommendations: According to ACC/AHA guidelines, no further cardiovascular testing needed.  The patient may proceed to surgery at acceptable risk.   Antiplatelet and/or Anticoagulation Recommendations: Clopidogrel (Plavix) can be held for 5 days prior to her surgery and resumed as soon as possible post op.  Please also obtain guidance from PCP/neurology  A copy of this note will be routed to requesting surgeon.  Time:   Today, I have spent 10 minutes with the patient with telehealth technology discussing medical history, symptoms, and management plan.     Elgie Collard, PA-C  11/05/2021, 8:42 AM

## 2021-11-05 NOTE — Patient Outreach (Signed)
  Care Coordination   11/05/2021 Name: Grace Hess MRN: [id number] DOB: November 01, 1955   Care Coordination Outreach Attempts:  An unsuccessful telephone outreach was attempted today to offer the patient information about available care coordination services as a benefit of their health plan.   Follow Up Plan:  Additional outreach attempts will be made to offer the patient care coordination information and services.   Encounter Outcome:  No Answer  Care Coordination Interventions Activated:  No   Care Coordination Interventions:  No, not indicated    Valente David, RN, MSN, [facility] Care Management Care Management Coordinator [phone]

## 2021-11-09 ENCOUNTER — Other Ambulatory Visit: Payer: Self-pay | Admitting: *Deleted

## 2021-11-09 NOTE — Patient Outreach (Signed)
  Care Coordination   11/09/2021 Name: Grace Hess MRN: [id number] DOB: 1956-02-16   Care Coordination Outreach Attempts:  A second unsuccessful outreach was attempted today to offer the patient with information about available care coordination services as a benefit of their health plan.     Follow Up Plan:  Additional outreach attempts will be made to offer the patient care coordination information and services.   Encounter Outcome:  No Answer  Care Coordination Interventions Activated:  No   Care Coordination Interventions:  No, not indicated    Valente David, RN, MSN, [facility] Care Management Care Management Coordinator [phone]

## 2021-11-11 DIAGNOSIS — Z6824 Body mass index (BMI) 24.0-24.9, adult: Secondary | ICD-10-CM | POA: Diagnosis not present

## 2021-11-11 DIAGNOSIS — E1122 Type 2 diabetes mellitus with diabetic chronic kidney disease: Secondary | ICD-10-CM | POA: Diagnosis not present

## 2021-11-11 DIAGNOSIS — I739 Peripheral vascular disease, unspecified: Secondary | ICD-10-CM | POA: Diagnosis not present

## 2021-11-11 DIAGNOSIS — C649 Malignant neoplasm of unspecified kidney, except renal pelvis: Secondary | ICD-10-CM | POA: Diagnosis not present

## 2021-11-12 ENCOUNTER — Other Ambulatory Visit: Payer: Self-pay | Admitting: *Deleted

## 2021-11-12 ENCOUNTER — Encounter: Payer: Self-pay | Admitting: *Deleted

## 2021-11-12 DIAGNOSIS — N183 Chronic kidney disease, stage 3 unspecified: Secondary | ICD-10-CM | POA: Diagnosis not present

## 2021-11-12 DIAGNOSIS — I10 Essential (primary) hypertension: Secondary | ICD-10-CM | POA: Diagnosis not present

## 2021-11-12 DIAGNOSIS — E1122 Type 2 diabetes mellitus with diabetic chronic kidney disease: Secondary | ICD-10-CM | POA: Diagnosis not present

## 2021-11-12 NOTE — Patient Outreach (Signed)
  Care Coordination   Initial Visit Note   11/12/2021 Name: Grace Hess MRN: [id number] DOB: 09/29/1955  Grace Hess is a 66 y.o. year old female who sees Redmond School, MD for primary care. I spoke with  Shanon Ace by phone today.  What matters to the patients health and wellness today?  Diagnosed with kidney cancer in May.  Left nephrectomy scheduled for 9/13.  Discussed how management of DM and HTN can affect outcome of surgery, she verbalizes understanding.  Monitors both daily.  Report AWV was completed a few weeks ago.    Goals Addressed               This Visit's Progress     Have nephrectomy without complications (pt-stated)        Care Coordination Interventions: Assessed patient understanding of cancer diagnosis and recommended treatment plan Reviewed upcoming provider appointments and treatment appointments Assessed available transportation to appointments and treatments. Has consistent/reliable transportation: Yes Assessed support system. Has consistent/reliable family or other support: Yes         SDOH assessments and interventions completed:  Yes  SDOH Interventions Today    Flowsheet Row Most Recent Value  SDOH Interventions   Food Insecurity Interventions Intervention Not Indicated  Housing Interventions Intervention Not Indicated  Transportation Interventions Intervention Not Indicated        Care Coordination Interventions Activated:  Yes  Care Coordination Interventions:  Yes, provided   Follow up plan: Follow up call scheduled for 9/18, pending discharge from hospital after surgery    Encounter Outcome:  Pt. Visit Completed   Valente David, RN, MSN, [place] Management Care Management Coordinator [phone]

## 2021-11-12 NOTE — Patient Instructions (Signed)
Visit Information  Thank you for taking time to visit with me today. Please don't hesitate to contact me if I can be of assistance to you before our next scheduled telephone appointment.  Following are the goals we discussed today:  Monitor BP and blood sugars daily.  Our next appointment is by telephone on 9/18  Please call the care guide team at [phone] if you need to cancel or reschedule your appointment.   Please call the Suicide and Crisis Lifeline: 988 call the [place] National Suicide Prevention Lifeline: 725-439-0651 or TTY: 8288556335 TTY 740-115-9283) to talk to a trained counselor call 1-800-273-TALK (toll free, 24 hour hotline) call the [facility]: [phone] call 911 if you are experiencing a Mental Health or [place] or need someone to talk to.  Patient verbalizes understanding of instructions and care plan provided today and agrees to view in [place]. Active MyChart status and patient understanding of how to access instructions and care plan via MyChart confirmed with patient.     The patient has been provided with contact information for the care management team and has been advised to call with any health related questions or concerns.   Valente David, RN, MSN, [place] Care Management Care Management Coordinator [phone]

## 2021-11-17 ENCOUNTER — Telehealth (INDEPENDENT_AMBULATORY_CARE_PROVIDER_SITE_OTHER): Payer: Self-pay | Admitting: *Deleted

## 2021-11-17 NOTE — Progress Notes (Addendum)
Anesthesia Review:  PCP: lawrence fusco- clearance LOV 11/11/21- preop clearance on chart  Cardiologist :  DR Molli [place] LOV 8/24/23Johann Capers Conte,PAC - preop exam  Chest x-ray :03/01/21- 2 view  EKG :02/11/21  04/17/21- Carotid dopplers  Echo : 2020  Stress test: Cardiac Cath :  Activity level: can do a flight of stairs without difficulty  Sleep Study/ CPAP : none  Fasting Blood Sugar :      / Checks Blood Sugar -- times a day:   Blood Thinner/ Instructions /Last Dose: ASA / Instructions/ Last Dose :   81 mg aspirin  Plavix - Last dose on 11/17/21 per pt  Ozempic- Last dose on 11/10/21 per pt  DM- type 2- checks glucose in am daily  Hgba1c- 11/19/21- 8.5 - routed to DR Louis Meckel on 11/19/21.  Smoker  PT reports at preop appt on 11/19/21 that she has received some instructions in mail from [place] for surgery when asked about bowel prep instructions.    PT instructed to follow those instrucctions.  Called pt at home on 11/19/21 in the pm to make sure pt had bowel prep instructons and aware to be on a clear liquid diet the day before surgery..  PT confirmed she did have bowel prep instructions.   PT voiced understanding.  Discussed with pt at preop the importantce of smoking cessation in regards to heart disease, peripheral vascular disease.  PT listened and voiced understanding.    CMP done 11/19/21- routed to Dr B. Herrick.

## 2021-11-17 NOTE — Telephone Encounter (Signed)
Fax from [employer]. Zofran approved from 11/13/21 -12-31/23. Patient called and notified and letter of approval faxed to pharmacy.

## 2021-11-18 NOTE — Patient Instructions (Signed)
SURGICAL WAITING ROOM VISITATION Patients having surgery or a procedure may have no more than 2 support people in the waiting area - these visitors may rotate.   Children under the age of 8 must have an adult with them who is not the patient. If the patient needs to stay at the hospital during part of their recovery, the visitor guidelines for inpatient rooms apply. Pre-op nurse will coordinate an appropriate time for 1 support person to accompany patient in pre-op.  This support person may not rotate.    Please refer to the [facility] website for the visitor guidelines for Inpatients (after your surgery is over and you are in a regular room).       Your procedure is scheduled on:  11/25/2021    Report to [facility] Main Entrance    Report to admitting at   [phone]   Call this number if you have problems the morning of surgery [phone]   Do not eat food :After Midnight.   After Midnight you may have the following liquids until __ 0530____ AM  DAY OF SURGERY  Water Non-Citrus Juices (without pulp, NO RED) Carbonated Beverages Black Coffee (NO MILK/CREAM OR CREAMERS, sugar ok)  Clear Tea (NO MILK/CREAM OR CREAMERS, sugar ok) regular and decaf                             Plain Jell-O (NO RED)                                           Fruit ices (not with fruit pulp, NO RED)                                     Popsicles (NO RED)                                                               Sports drinks like Gatorade (NO RED)                           Oral Hygiene is also important to reduce your risk of infection.                                    Remember - BRUSH YOUR TEETH THE MORNING OF SURGERY WITH YOUR REGULAR TOOTHPASTE   Do NOT smoke after Midnight   Take these medicines the morning of surgery with A SIP OF WATER:  metoprolol, inhalers as usual and bring, amlodipine, lexapro , protonix   DO NOT TAKE ANY ORAL DIABETIC MEDICATIONS DAY OF YOUR  SURGERY  Bring CPAP mask and tubing day of surgery.                              You may not have any metal on your body including hair pins, jewelry, and body piercing  Do not wear make-up, lotions, powders, perfumes/cologne, or deodorant  Do not wear nail polish including gel and S&S, artificial/acrylic nails, or any other type of covering on natural nails including finger and toenails. If you have artificial nails, gel coating, etc. that needs to be removed by a nail salon please have this removed prior to surgery or surgery may need to be canceled/ delayed if the surgeon/ anesthesia feels like they are unable to be safely monitored.   Do not shave  48 hours prior to surgery.               Men may shave face and neck.   Do not bring valuables to the hospital. [place].   Contacts, dentures or bridgework may not be worn into surgery.   Bring small overnight bag day of surgery.   DO NOT [place]. PHARMACY WILL DISPENSE MEDICATIONS LISTED ON YOUR MEDICATION LIST TO YOU DURING YOUR ADMISSION [place]!    Patients discharged on the day of surgery will not be allowed to drive home.  Someone NEEDS to stay with you for the first 24 hours after anesthesia.   Special Instructions: Bring a copy of your healthcare power of attorney and living will documents         the day of surgery if you haven't scanned them before.              Please read over the following fact sheets you were given: IF YOU HAVE QUESTIONS ABOUT YOUR PRE-OP INSTRUCTIONS PLEASE CALL [phone]     [facility] - Preparing for Surgery Before surgery, you can play an important role.  Because skin is not sterile, your skin needs to be as free of germs as possible.  You can reduce the number of germs on your skin by washing with CHG (chlorahexidine gluconate) soap before surgery.  CHG is an antiseptic cleaner which kills  germs and bonds with the skin to continue killing germs even after washing. Please DO NOT use if you have an allergy to CHG or antibacterial soaps.  If your skin becomes reddened/irritated stop using the CHG and inform your nurse when you arrive at Short Stay. Do not shave (including legs and underarms) for at least 48 hours prior to the first CHG shower.  You may shave your face/neck. Please follow these instructions carefully:  1.  Shower with CHG Soap the night before surgery and the  morning of Surgery.  2.  If you choose to wash your hair, wash your hair first as usual with your  normal  shampoo.  3.  After you shampoo, rinse your hair and body thoroughly to remove the  shampoo.                           4.  Use CHG as you would any other liquid soap.  You can apply chg directly  to the skin and wash                       Gently with a scrungie or clean washcloth.  5.  Apply the CHG Soap to your body ONLY FROM THE NECK DOWN.   Do not use on face/ open  Wound or open sores. Avoid contact with eyes, ears mouth and genitals (private parts).                       Wash face,  Genitals (private parts) with your normal soap.             6.  Wash thoroughly, paying special attention to the area where your surgery  will be performed.  7.  Thoroughly rinse your body with warm water from the neck down.  8.  DO NOT shower/wash with your normal soap after using and rinsing off  the CHG Soap.                9.  Pat yourself dry with a clean towel.            10.  Wear clean pajamas.            11.  Place clean sheets on your bed the night of your first shower and do not  sleep with pets. Day of Surgery : Do not apply any lotions/deodorants the morning of surgery.  Please wear clean clothes to the hospital/surgery center.  FAILURE TO FOLLOW THESE INSTRUCTIONS MAY RESULT IN THE CANCELLATION OF YOUR SURGERY PATIENT SIGNATURE_________________________________  NURSE  SIGNATURE__________________________________  ________________________________________________________________________

## 2021-11-19 ENCOUNTER — Other Ambulatory Visit: Payer: Self-pay

## 2021-11-19 ENCOUNTER — Encounter (HOSPITAL_COMMUNITY)
Admission: RE | Admit: 2021-11-19 | Discharge: 2021-11-19 | Disposition: A | Discharge status: Home / Self Care | Payer: HMO | Source: Ambulatory Visit | Attending: Urology | Admitting: Urology

## 2021-11-19 ENCOUNTER — Encounter (HOSPITAL_COMMUNITY): Payer: Self-pay

## 2021-11-19 VITALS — BP 110/55 | HR 59 | Temp 98.3°F | Resp 16 | Ht 66.0 in | Wt 150.0 lb

## 2021-11-19 DIAGNOSIS — Z8673 Personal history of transient ischemic attack (TIA), and cerebral infarction without residual deficits: Secondary | ICD-10-CM | POA: Diagnosis not present

## 2021-11-19 DIAGNOSIS — N2889 Other specified disorders of kidney and ureter: Secondary | ICD-10-CM | POA: Diagnosis not present

## 2021-11-19 DIAGNOSIS — I10 Essential (primary) hypertension: Secondary | ICD-10-CM | POA: Insufficient documentation

## 2021-11-19 DIAGNOSIS — Z01818 Encounter for other preprocedural examination: Secondary | ICD-10-CM | POA: Insufficient documentation

## 2021-11-19 DIAGNOSIS — E119 Type 2 diabetes mellitus without complications: Secondary | ICD-10-CM | POA: Insufficient documentation

## 2021-11-19 DIAGNOSIS — F17200 Nicotine dependence, unspecified, uncomplicated: Secondary | ICD-10-CM | POA: Diagnosis not present

## 2021-11-19 DIAGNOSIS — Z955 Presence of coronary angioplasty implant and graft: Secondary | ICD-10-CM | POA: Insufficient documentation

## 2021-11-19 DIAGNOSIS — I739 Peripheral vascular disease, unspecified: Secondary | ICD-10-CM | POA: Insufficient documentation

## 2021-11-19 DIAGNOSIS — I2510 Atherosclerotic heart disease of native coronary artery without angina pectoris: Secondary | ICD-10-CM | POA: Diagnosis not present

## 2021-11-19 HISTORY — DX: Malignant (primary) neoplasm, unspecified: C80.1

## 2021-11-19 HISTORY — DX: Depression, unspecified: F32.A

## 2021-11-19 LAB — HEMOGLOBIN A1C
Hgb A1c MFr Bld: 8.5 % — ABNORMAL HIGH (ref 4.8–5.6)
Mean Plasma Glucose: 197.25 mg/dL

## 2021-11-19 LAB — GLUCOSE, CAPILLARY: Glucose-Capillary: 208 mg/dL — ABNORMAL HIGH (ref 70–99)

## 2021-11-19 LAB — CBC
HCT: 34.5 % — ABNORMAL LOW (ref 36.0–46.0)
Hemoglobin: 11 g/dL — ABNORMAL LOW (ref 12.0–15.0)
MCH: 30.1 pg (ref 26.0–34.0)
MCHC: 31.9 g/dL (ref 30.0–36.0)
MCV: 94.5 fL (ref 80.0–100.0)
Platelets: 201 10*3/uL (ref 150–400)
RBC: 3.65 MIL/uL — ABNORMAL LOW (ref 3.87–5.11)
RDW: 14.6 % (ref 11.5–15.5)
WBC: 9.8 10*3/uL (ref 4.0–10.5)
nRBC: 0 % (ref 0.0–0.2)

## 2021-11-19 LAB — COMPREHENSIVE METABOLIC PANEL
ALT: 12 U/L (ref 0–44)
AST: 14 U/L — ABNORMAL LOW (ref 15–41)
Albumin: 3.9 g/dL (ref 3.5–5.0)
Alkaline Phosphatase: 69 U/L (ref 38–126)
Anion gap: 6 (ref 5–15)
BUN: 49 mg/dL — ABNORMAL HIGH (ref 8–23)
CO2: 22 mmol/L (ref 22–32)
Calcium: 9.1 mg/dL (ref 8.9–10.3)
Chloride: 113 mmol/L — ABNORMAL HIGH (ref 98–111)
Creatinine, Ser: 1.8 mg/dL — ABNORMAL HIGH (ref 0.44–1.00)
GFR, Estimated: 31 mL/min — ABNORMAL LOW (ref >60)
Glucose, Bld: 224 mg/dL — ABNORMAL HIGH (ref 70–99)
Potassium: 4.6 mmol/L (ref 3.5–5.1)
Sodium: 141 mmol/L (ref 135–145)
Total Bilirubin: 0.5 mg/dL (ref 0.3–1.2)
Total Protein: 6.8 g/dL (ref 6.5–8.1)

## 2021-11-20 LAB — URINE CULTURE: Culture: NO GROWTH

## 2021-11-20 NOTE — Progress Notes (Signed)
Anesthesia Chart Review   Case: [id number] Date/Time: 11/25/21 0815   Procedure: LEFT LAPAROSCOPIC RADICAL NEPHRECTOMY (Left)   Anesthesia type: General   Pre-op diagnosis: LEFT RENAL MASS   Location: [facility] ROOM 01 / [facility]   Surgeons: Grace Hughs, MD       DISCUSSION:66 y.o. smoker with h/o HTN, DM II, stroke, CAD (status post DES to RCA in 07/2016 with residual mid LAD stenosis), PAD (known CTO to the left SFA), left renal mass scheduled for above procedure 11/25/2021 with Dr. Louis Hess.   Pt seen by cardiology 11/05/2021. Per OV note, "Grace Hess's perioperative risk of a major cardiac event is 11% according to the Revised Cardiac Risk Index (RCRI).  Therefore, she is at high risk for perioperative complications.   Her functional capacity is good at 5.07 METs according to the Duke Activity Status Index (DASI). Recommendations: According to ACC/AHA guidelines, no further cardiovascular testing needed.  The patient may proceed to surgery at acceptable risk.   Antiplatelet and/or Anticoagulation Recommendations: Clopidogrel (Plavix) can be held for 5 days prior to her surgery and resumed as soon as possible post op.  Please also obtain guidance from PCP/neurology"  Anticipate pt can proceed with planned procedure barring acute status change.   VS: BP (!) 110/55   Pulse (!) 59   Temp 36.8 C (Oral)   Resp 16   Ht 5' 6" (1.676 m)   Wt 68 kg   SpO2 95%   BMI 24.21 kg/m   PROVIDERS: Grace School, MD is PCP   Primary Cardiologist:  Grace Dolly, MD LABS: Labs reviewed: Acceptable for surgery. (all labs ordered are listed, but only abnormal results are displayed)  Labs Reviewed  CBC - Abnormal; Notable for the following components:      Result Value   RBC 3.65 (*)    Hemoglobin 11.0 (*)    HCT 34.5 (*)    All other components within normal limits  COMPREHENSIVE METABOLIC PANEL - Abnormal; Notable for the following components:   Chloride 113 (*)    Glucose, Bld  224 (*)    BUN 49 (*)    Creatinine, Ser 1.80 (*)    AST 14 (*)    GFR, Estimated 31 (*)    All other components within normal limits  HEMOGLOBIN A1C - Abnormal; Notable for the following components:   Hgb A1c MFr Bld 8.5 (*)    All other components within normal limits  GLUCOSE, CAPILLARY - Abnormal; Notable for the following components:   Glucose-Capillary 208 (*)    All other components within normal limits  URINE CULTURE  TYPE AND SCREEN     IMAGES:   EKG:   CV: Echo 01/11/2019 1. Left ventricular ejection fraction, by visual estimation, is 65 to  70%. The left ventricle has normal function. There is mildly increased  left ventricular hypertrophy.   2. Global right ventricle has normal systolic function.The right  ventricular size is normal. No increase in right ventricular wall  thickness.   3. Left atrial size was normal.   4. Right atrial size was normal.   5. Presence of pericardial fat pad.   6. The pericardial effusion is circumferential.   7. Trivial pericardial effusion is present.   8. Mild aortic valve annular calcification.   9. The mitral valve is normal in structure. No evidence of mitral valve  regurgitation. No evidence of mitral stenosis.  10. The tricuspid valve is normal in structure. Tricuspid valve  regurgitation is  not demonstrated.  11. The aortic valve has an indeterminant number of cusps. Aortic valve  regurgitation is not visualized. Mild aortic valve sclerosis without  stenosis.  12. There is Mild calcification of the aortic valve.  13. There is Mild thickening of the aortic valve.  14. The pulmonic valve was not well visualized. Pulmonic valve  regurgitation is not visualized. Past Medical History:  Diagnosis Date   Anxiety    Arthritis    "might have a touch in my fingers" (08/02/2016)   CAD (coronary artery disease)    a. s/p DES to RCA in 07/2016 with residual mid-LAD stenosis   Cancer ([facility])    skin cancer on finger    Cervicalgia    Chest pain, unspecified    Depression    GERD (gastroesophageal reflux disease)    Headache    "bad ones after my stroke in 2016; only have them when I get bad news now" (08/02/2016)   Heart murmur    Heavy smoker (more than 20 cigarettes per day)    Scheduled for LDCT screening 02/11/15   History of hiatal hernia    History of kidney stones    "no cysto/OR" (12/10/2014)   Hypercholesteremia    Hypertension    Obesity    Reflux esophagitis    Stroke [facility])    "they said I had a silent stroke a few months back/MRI" (12/10/2014)   TIA (transient ischemic attack) 11/28/2014   [facility] 11/28/2014   Type II diabetes mellitus ([place])     Past Surgical History:  Procedure Laterality Date   ABDOMINAL AORTOGRAM N/A 08/02/2016   Procedure: Abdominal Aortogram;  Surgeon: Grace Bush, MD;  Location: [place] [facility];  Service: Cardiovascular;  Laterality: N/A;   CAROTID STENT     CHOLECYSTECTOMY OPEN  1978   COLONOSCOPY  2010   single diverticulum in sigmoid colon   CORONARY ANGIOPLASTY WITH STENT PLACEMENT  08/02/2016   to RCA   CORONARY STENT INTERVENTION N/A 08/02/2016   Procedure: Coronary Stent Intervention;  Surgeon: Grace Bush, MD;  Location: [place] [facility];  Service: Cardiovascular;  Laterality: N/A;  RCA   CYSTOSCOPY W/ RETROGRADES Bilateral 07/15/2021   Procedure: CYSTOSCOPY WITH RETROGRADE PYELOGRAM;  Surgeon: Grace Hess., MD;  Location: [facility];  Service: Urology;  Laterality: Bilateral;   CYSTOSCOPY WITH STENT PLACEMENT Left 07/15/2021   Procedure: CYSTOSCOPY WITH STENT PLACEMENT;  Surgeon: Grace Hess., MD;  Location: [facility];  Service: Urology;  Laterality: Left;   ENDARTERECTOMY Left 12/02/2014   Procedure: ENDARTERECTOMY CAROTID;  Surgeon: Grace Posner, MD;  Location: [place];  Service: Vascular;  Laterality: Left;   LEFT HEART CATH AND CORONARY ANGIOGRAPHY N/A 08/02/2016   Procedure: Left Heart Cath and Coronary Angiography;  Surgeon:  Grace Bush, MD;  Location: [place] [facility];  Service: Cardiovascular;  Laterality: N/A;   LOWER EXTREMITY ANGIOGRAM  08/02/2016   LOWER EXTREMITY ANGIOGRAPHY N/A 08/02/2016   Procedure: Lower Extremity Angiography;  Surgeon: Grace Bush, MD;  Location: [place] [facility];  Service: Cardiovascular;  Laterality: N/A;   VAGINAL HYSTERECTOMY  1991   "partial"    MEDICATIONS:  acetaminophen (TYLENOL) 325 MG tablet   albuterol (VENTOLIN HFA) 108 (90 Base) MCG/ACT inhaler   ALPRAZolam (XANAX) 0.5 MG tablet   amLODipine (NORVASC) 5 MG tablet   aspirin EC 81 MG tablet   benzonatate (TESSALON) 100 MG capsule   Biotin 5000 MCG CAPS   Blood Glucose Monitoring Suppl (ACCU-CHEK GUIDE) w/Device  KIT   Cholecalciferol (VITAMIN D3) 125 MCG (5000 UT) CAPS   clopidogrel (PLAVIX) 75 MG tablet   escitalopram (LEXAPRO) 20 MG tablet   gabapentin (NEURONTIN) 300 MG capsule   glipiZIDE (GLUCOTROL XL) 2.5 MG 24 hr tablet   glucose blood (ACCU-CHEK GUIDE) test strip   metoprolol tartrate (LOPRESSOR) 25 MG tablet   nystatin cream (MYCOSTATIN)   ondansetron (ZOFRAN) 4 MG tablet   oxyCODONE-acetaminophen (PERCOCET/ROXICET) 5-325 MG tablet   OZEMPIC, 0.25 OR 0.5 MG/DOSE, 2 MG/1.5ML SOPN   pantoprazole (PROTONIX) 40 MG tablet   rosuvastatin (CRESTOR) 20 MG tablet   No current facility-administered medications for this encounter.    Konrad Felix Ward, PA-C [facility] [phone]

## 2021-11-20 NOTE — Anesthesia Preprocedure Evaluation (Signed)
Anesthesia Evaluation  Patient identified by MRN, date of birth, ID band Patient awake    Reviewed: Allergy & Precautions, NPO status , Patient's Chart, lab work & pertinent test results  History of Anesthesia Complications Negative for: history of anesthetic complications  Airway Mallampati: II  TM Distance: >3 FB Neck ROM: Full    Dental no notable dental hx. (+) Dental Advisory Given   Pulmonary Current Smoker and Patient abstained from smoking.,    Pulmonary exam normal        Cardiovascular hypertension, Pt. on medications + CAD, + Cardiac Stents and + Peripheral Vascular Disease  Normal cardiovascular exam  IMPRESSIONS    1. Left ventricular ejection fraction, by visual estimation, is 65 to  70%. The left ventricle has normal function. There is mildly increased  left ventricular hypertrophy.  2. Global right ventricle has normal systolic function.The right  ventricular size is normal. No increase in right ventricular wall  thickness.  3. Left atrial size was normal.  4. Right atrial size was normal.  5. Presence of pericardial fat pad.  6. The pericardial effusion is circumferential.  7. Trivial pericardial effusion is present.  8. Mild aortic valve annular calcification.  9. The mitral valve is normal in structure. No evidence of mitral valve  regurgitation. No evidence of mitral stenosis.  10. The tricuspid valve is normal in structure. Tricuspid valve  regurgitation is not demonstrated.  11. The aortic valve has an indeterminant number of cusps. Aortic valve  regurgitation is not visualized. Mild aortic valve sclerosis without  stenosis.  12. There is Mild calcification of the aortic valve.  13. There is Mild thickening of the aortic valve.  14. The pulmonic valve was not well visualized. Pulmonic valve  regurgitation is not visualized.    Neuro/Psych PSYCHIATRIC DISORDERS Anxiety Depression CVA     GI/Hepatic Neg liver ROS, hiatal hernia, GERD  Medicated,  Endo/Other  negative endocrine ROSdiabetes  Renal/GU      Musculoskeletal negative musculoskeletal ROS (+)   Abdominal   Peds  Hematology negative hematology ROS (+)   Anesthesia Other Findings Ms.Brittian's perioperative risk of a major cardiac event is11% according to the Revised Cardiac Risk Index (RCRI). Therefore, sheis at highrisk for perioperative complications. Herfunctional capacity is goodat 5.07METs according to the Duke Activity Status Index (DASI). Recommendations: According to ACC/AHA guidelines, no further cardiovascular testing needed. The patient may proceed to surgery at acceptable risk.  Antiplatelet and/or Anticoagulation Recommendations: Clopidogrel (Plavix) can be held for5days prior to hersurgery and resumed as soon as possible post op. Please also obtain guidance from PCP/neurology"  Reproductive/Obstetrics                          Anesthesia Physical Anesthesia Plan  ASA: 3  Anesthesia Plan: General   Post-op Pain Management: Celebrex PO (pre-op)* and Ketamine IV*   Induction: Intravenous  PONV Risk Score and Plan: 4 or greater and Ondansetron, Dexamethasone, Diphenhydramine, Treatment may vary due to age or medical condition and Scopolamine patch - Pre-op  Airway Management Planned: Oral ETT  Additional Equipment: ClearSight  Intra-op Plan:   Post-operative Plan: Post-operative intubation/ventilation  Informed Consent: I have reviewed the patients History and Physical, chart, labs and discussed the procedure including the risks, benefits and alternatives for the proposed anesthesia with the patient or authorized representative who has indicated his/her understanding and acceptance.     Dental advisory given  Plan Discussed with: Anesthesiologist and CRNA  Anesthesia  Plan Comments: (See PAT note 11/19/2021)      Anesthesia Quick  Evaluation

## 2021-11-25 ENCOUNTER — Encounter (HOSPITAL_COMMUNITY): Payer: Self-pay | Admitting: Urology

## 2021-11-25 ENCOUNTER — Encounter (HOSPITAL_COMMUNITY)
Admission: RE | Disposition: A | Discharge status: Home / Self Care | Payer: Self-pay | Source: Ambulatory Visit | Attending: Urology

## 2021-11-25 ENCOUNTER — Inpatient Hospital Stay (HOSPITAL_COMMUNITY)
Admission: RE | Admit: 2021-11-25 | Discharge: 2021-11-26 | DRG: 658 | Disposition: A | Discharge status: Home / Self Care | Payer: HMO | Source: Ambulatory Visit | Attending: Urology | Admitting: Urology

## 2021-11-25 ENCOUNTER — Other Ambulatory Visit: Payer: Self-pay

## 2021-11-25 ENCOUNTER — Inpatient Hospital Stay (HOSPITAL_COMMUNITY): Payer: HMO | Admitting: Anesthesiology

## 2021-11-25 ENCOUNTER — Inpatient Hospital Stay (HOSPITAL_COMMUNITY): Payer: HMO | Admitting: Physician Assistant

## 2021-11-25 DIAGNOSIS — Z79899 Other long term (current) drug therapy: Secondary | ICD-10-CM

## 2021-11-25 DIAGNOSIS — E1122 Type 2 diabetes mellitus with diabetic chronic kidney disease: Secondary | ICD-10-CM | POA: Diagnosis present

## 2021-11-25 DIAGNOSIS — Z7902 Long term (current) use of antithrombotics/antiplatelets: Secondary | ICD-10-CM | POA: Diagnosis not present

## 2021-11-25 DIAGNOSIS — F17210 Nicotine dependence, cigarettes, uncomplicated: Secondary | ICD-10-CM | POA: Diagnosis not present

## 2021-11-25 DIAGNOSIS — C649 Malignant neoplasm of unspecified kidney, except renal pelvis: Secondary | ICD-10-CM | POA: Diagnosis present

## 2021-11-25 DIAGNOSIS — Z7984 Long term (current) use of oral hypoglycemic drugs: Secondary | ICD-10-CM

## 2021-11-25 DIAGNOSIS — Z7982 Long term (current) use of aspirin: Secondary | ICD-10-CM | POA: Diagnosis not present

## 2021-11-25 DIAGNOSIS — I2510 Atherosclerotic heart disease of native coronary artery without angina pectoris: Secondary | ICD-10-CM | POA: Diagnosis not present

## 2021-11-25 DIAGNOSIS — E7800 Pure hypercholesterolemia, unspecified: Secondary | ICD-10-CM | POA: Diagnosis not present

## 2021-11-25 DIAGNOSIS — F418 Other specified anxiety disorders: Secondary | ICD-10-CM

## 2021-11-25 DIAGNOSIS — Z8249 Family history of ischemic heart disease and other diseases of the circulatory system: Secondary | ICD-10-CM | POA: Diagnosis not present

## 2021-11-25 DIAGNOSIS — N1832 Chronic kidney disease, stage 3b: Secondary | ICD-10-CM | POA: Diagnosis present

## 2021-11-25 DIAGNOSIS — C642 Malignant neoplasm of left kidney, except renal pelvis: Principal | ICD-10-CM | POA: Diagnosis present

## 2021-11-25 DIAGNOSIS — Z825 Family history of asthma and other chronic lower respiratory diseases: Secondary | ICD-10-CM | POA: Diagnosis not present

## 2021-11-25 DIAGNOSIS — E1151 Type 2 diabetes mellitus with diabetic peripheral angiopathy without gangrene: Secondary | ICD-10-CM | POA: Diagnosis not present

## 2021-11-25 DIAGNOSIS — Z7985 Long-term (current) use of injectable non-insulin antidiabetic drugs: Secondary | ICD-10-CM | POA: Diagnosis not present

## 2021-11-25 DIAGNOSIS — I129 Hypertensive chronic kidney disease with stage 1 through stage 4 chronic kidney disease, or unspecified chronic kidney disease: Secondary | ICD-10-CM | POA: Diagnosis present

## 2021-11-25 DIAGNOSIS — Z955 Presence of coronary angioplasty implant and graft: Secondary | ICD-10-CM | POA: Diagnosis not present

## 2021-11-25 DIAGNOSIS — Z01818 Encounter for other preprocedural examination: Secondary | ICD-10-CM

## 2021-11-25 HISTORY — PX: LAPAROSCOPIC NEPHRECTOMY: SHX1930

## 2021-11-25 LAB — GLUCOSE, CAPILLARY
Glucose-Capillary: 198 mg/dL — ABNORMAL HIGH (ref 70–99)
Glucose-Capillary: 260 mg/dL — ABNORMAL HIGH (ref 70–99)
Glucose-Capillary: 245 mg/dL — ABNORMAL HIGH (ref 70–99)
Glucose-Capillary: 382 mg/dL — ABNORMAL HIGH (ref 70–99)

## 2021-11-25 LAB — TYPE AND SCREEN
ABO/RH(D): A POS
Antibody Screen: NEGATIVE

## 2021-11-25 SURGERY — NEPHRECTOMY, RADICAL, LAPAROSCOPIC, ADULT
Anesthesia: General | Laterality: Left

## 2021-11-25 MED ORDER — 0.9 % SODIUM CHLORIDE (POUR BTL) OPTIME
TOPICAL | Status: DC | PRN
  Administered 2021-11-25: 1000 mL

## 2021-11-25 MED ORDER — DEXAMETHASONE SODIUM PHOSPHATE 10 MG/ML IJ SOLN
INTRAMUSCULAR | Status: AC
  Filled 2021-11-25: qty 1

## 2021-11-25 MED ORDER — PROPOFOL 10 MG/ML IV BOLUS
INTRAVENOUS | Status: DC | PRN
  Administered 2021-11-25: 150 mg via INTRAVENOUS

## 2021-11-25 MED ORDER — OXYCODONE HCL 5 MG PO TABS
5.0000 mg | ORAL_TABLET | ORAL | Status: DC | PRN
  Administered 2021-11-25: 5 mg via ORAL
  Filled 2021-11-25: qty 1

## 2021-11-25 MED ORDER — SCOPOLAMINE 1 MG/3DAYS TD PT72
MEDICATED_PATCH | TRANSDERMAL | Status: AC
  Filled 2021-11-25: qty 1

## 2021-11-25 MED ORDER — ONDANSETRON HCL 4 MG/2ML IJ SOLN: 4.0000 mg | INTRAMUSCULAR | Status: DC | PRN

## 2021-11-25 MED ORDER — BUPIVACAINE-EPINEPHRINE 0.5% -1:200000 IJ SOLN
INTRAMUSCULAR | Status: AC
  Filled 2021-11-25: qty 1

## 2021-11-25 MED ORDER — AMISULPRIDE (ANTIEMETIC) 5 MG/2ML IV SOLN
10.0000 mg | Freq: Once | INTRAVENOUS | Status: DC | PRN
Start: 2021-11-25 — End: 2021-11-25

## 2021-11-25 MED ORDER — GABAPENTIN 300 MG PO CAPS
300.0000 mg | ORAL_CAPSULE | Freq: Every day | ORAL | Status: DC
  Administered 2021-11-25: 300 mg via ORAL
  Filled 2021-11-25: qty 1

## 2021-11-25 MED ORDER — SUGAMMADEX SODIUM 200 MG/2ML IV SOLN
INTRAVENOUS | Status: DC | PRN
  Administered 2021-11-25: 200 mg via INTRAVENOUS

## 2021-11-25 MED ORDER — BUPIVACAINE-EPINEPHRINE 0.5% -1:200000 IJ SOLN
INTRAMUSCULAR | Status: DC | PRN
  Administered 2021-11-25: 10 mL

## 2021-11-25 MED ORDER — LIDOCAINE HCL 2 % IJ SOLN
INTRAMUSCULAR | Status: AC
  Filled 2021-11-25: qty 20

## 2021-11-25 MED ORDER — CEFAZOLIN SODIUM-DEXTROSE 2-4 GM/100ML-% IV SOLN
2.0000 g | INTRAVENOUS | Status: AC
  Administered 2021-11-25: 2 g via INTRAVENOUS
  Filled 2021-11-25: qty 100

## 2021-11-25 MED ORDER — HYDROMORPHONE HCL 1 MG/ML IJ SOLN: 0.5000 mg | INTRAMUSCULAR | Status: DC | PRN

## 2021-11-25 MED ORDER — INSULIN ASPART 100 UNIT/ML IJ SOLN
0.0000 [IU] | Freq: Every day | INTRAMUSCULAR | Status: DC
  Administered 2021-11-25: 5 [IU] via SUBCUTANEOUS

## 2021-11-25 MED ORDER — ORAL CARE MOUTH RINSE: 15.0000 mL | Freq: Once | OROMUCOSAL | Status: AC

## 2021-11-25 MED ORDER — ONDANSETRON HCL 4 MG/2ML IJ SOLN
INTRAMUSCULAR | Status: AC
  Filled 2021-11-25: qty 2

## 2021-11-25 MED ORDER — ALBUTEROL SULFATE (2.5 MG/3ML) 0.083% IN NEBU: 2.5000 mg | INHALATION_SOLUTION | RESPIRATORY_TRACT | Status: DC | PRN

## 2021-11-25 MED ORDER — BUPIVACAINE LIPOSOME 1.3 % IJ SUSP
INTRAMUSCULAR | Status: AC
  Filled 2021-11-25: qty 20

## 2021-11-25 MED ORDER — GLYCOPYRROLATE 0.2 MG/ML IJ SOLN
INTRAMUSCULAR | Status: DC | PRN
  Administered 2021-11-25: .2 mg via INTRAVENOUS

## 2021-11-25 MED ORDER — PROPOFOL 500 MG/50ML IV EMUL
INTRAVENOUS | Status: AC
  Filled 2021-11-25: qty 50

## 2021-11-25 MED ORDER — GLYCOPYRROLATE 0.2 MG/ML IJ SOLN
INTRAMUSCULAR | Status: AC
  Filled 2021-11-25: qty 1

## 2021-11-25 MED ORDER — ESCITALOPRAM OXALATE 20 MG PO TABS
20.0000 mg | ORAL_TABLET | Freq: Every day | ORAL | Status: DC
  Administered 2021-11-25 – 2021-11-26 (×2): 20 mg via ORAL
  Filled 2021-11-25 (×2): qty 1

## 2021-11-25 MED ORDER — LACTATED RINGERS IV SOLN: INTRAVENOUS | Status: DC | PRN

## 2021-11-25 MED ORDER — FENTANYL CITRATE PF 50 MCG/ML IJ SOSY: 25.0000 ug | PREFILLED_SYRINGE | INTRAMUSCULAR | Status: DC | PRN

## 2021-11-25 MED ORDER — ROCURONIUM BROMIDE 10 MG/ML (PF) SYRINGE
PREFILLED_SYRINGE | INTRAVENOUS | Status: DC | PRN
  Administered 2021-11-25: 10 mg via INTRAVENOUS
  Administered 2021-11-25: 80 mg via INTRAVENOUS

## 2021-11-25 MED ORDER — PROMETHAZINE HCL 25 MG/ML IJ SOLN: 6.2500 mg | INTRAMUSCULAR | Status: DC | PRN

## 2021-11-25 MED ORDER — HEMOSTATIC AGENTS (NO CHARGE) OPTIME
TOPICAL | Status: DC | PRN
  Administered 2021-11-25: 1 via TOPICAL

## 2021-11-25 MED ORDER — SODIUM CHLORIDE (PF) 0.9 % IJ SOLN
INTRAMUSCULAR | Status: DC | PRN
  Administered 2021-11-25: 50 mL

## 2021-11-25 MED ORDER — FENTANYL CITRATE (PF) 250 MCG/5ML IJ SOLN
INTRAMUSCULAR | Status: AC
  Filled 2021-11-25: qty 5

## 2021-11-25 MED ORDER — INSULIN ASPART 100 UNIT/ML IJ SOLN
0.0000 [IU] | Freq: Three times a day (TID) | INTRAMUSCULAR | Status: DC
  Administered 2021-11-25: 8 [IU] via SUBCUTANEOUS
  Administered 2021-11-25 – 2021-11-26 (×3): 5 [IU] via SUBCUTANEOUS

## 2021-11-25 MED ORDER — METOPROLOL TARTRATE 25 MG PO TABS
25.0000 mg | ORAL_TABLET | Freq: Two times a day (BID) | ORAL | Status: DC
  Administered 2021-11-25 – 2021-11-26 (×2): 25 mg via ORAL
  Filled 2021-11-25 (×2): qty 1

## 2021-11-25 MED ORDER — ROSUVASTATIN CALCIUM 20 MG PO TABS
20.0000 mg | ORAL_TABLET | Freq: Every day | ORAL | Status: DC
  Administered 2021-11-25: 20 mg via ORAL
  Filled 2021-11-25: qty 1

## 2021-11-25 MED ORDER — DIPHENHYDRAMINE HCL 12.5 MG/5ML PO ELIX: 12.5000 mg | ORAL_SOLUTION | Freq: Four times a day (QID) | ORAL | Status: DC | PRN

## 2021-11-25 MED ORDER — CHLORHEXIDINE GLUCONATE 0.12 % MT SOLN
15.0000 mL | Freq: Once | OROMUCOSAL | Status: AC
  Administered 2021-11-25: 15 mL via OROMUCOSAL

## 2021-11-25 MED ORDER — OXYCODONE HCL 5 MG PO TABS
10.0000 mg | ORAL_TABLET | ORAL | Status: DC | PRN
  Administered 2021-11-26: 10 mg via ORAL
  Filled 2021-11-25: qty 2

## 2021-11-25 MED ORDER — ROCURONIUM BROMIDE 10 MG/ML (PF) SYRINGE
PREFILLED_SYRINGE | INTRAVENOUS | Status: AC
  Filled 2021-11-25: qty 10

## 2021-11-25 MED ORDER — SODIUM CHLORIDE (PF) 0.9 % IJ SOLN
INTRAMUSCULAR | Status: AC
  Filled 2021-11-25: qty 30

## 2021-11-25 MED ORDER — DIPHENHYDRAMINE HCL 50 MG/ML IJ SOLN: 12.5000 mg | Freq: Four times a day (QID) | INTRAMUSCULAR | Status: DC | PRN

## 2021-11-25 MED ORDER — KETAMINE HCL 10 MG/ML IJ SOLN
INTRAMUSCULAR | Status: DC | PRN
  Administered 2021-11-25: 30 mg via INTRAVENOUS
  Administered 2021-11-25: 20 mg via INTRAVENOUS

## 2021-11-25 MED ORDER — FENTANYL CITRATE (PF) 100 MCG/2ML IJ SOLN
INTRAMUSCULAR | Status: DC | PRN
  Administered 2021-11-25 (×3): 50 ug via INTRAVENOUS
  Administered 2021-11-25: 100 ug via INTRAVENOUS

## 2021-11-25 MED ORDER — EPHEDRINE SULFATE-NACL 50-0.9 MG/10ML-% IV SOSY
PREFILLED_SYRINGE | INTRAVENOUS | Status: DC | PRN
  Administered 2021-11-25 (×3): 10 mg via INTRAVENOUS
  Administered 2021-11-25: 20 mg via INTRAVENOUS

## 2021-11-25 MED ORDER — ORAL CARE MOUTH RINSE: 15.0000 mL | OROMUCOSAL | Status: DC | PRN

## 2021-11-25 MED ORDER — SCOPOLAMINE 1 MG/3DAYS TD PT72
MEDICATED_PATCH | TRANSDERMAL | Status: DC | PRN
  Administered 2021-11-25: 1 via TRANSDERMAL

## 2021-11-25 MED ORDER — LIDOCAINE HCL (PF) 2 % IJ SOLN
INTRAMUSCULAR | Status: DC | PRN
  Administered 2021-11-25: 1.5 mg/kg/h via INTRADERMAL

## 2021-11-25 MED ORDER — DIPHENHYDRAMINE HCL 50 MG/ML IJ SOLN
INTRAMUSCULAR | Status: DC | PRN
  Administered 2021-11-25: 12.5 mg via INTRAVENOUS

## 2021-11-25 MED ORDER — LACTATED RINGERS IV SOLN: INTRAVENOUS | Status: DC

## 2021-11-25 MED ORDER — DIPHENHYDRAMINE HCL 50 MG/ML IJ SOLN
INTRAMUSCULAR | Status: AC
  Filled 2021-11-25: qty 1

## 2021-11-25 MED ORDER — BUPIVACAINE LIPOSOME 1.3 % IJ SUSP
INTRAMUSCULAR | Status: DC | PRN
  Administered 2021-11-25: 20 mL

## 2021-11-25 MED ORDER — SODIUM CHLORIDE (PF) 0.9 % IJ SOLN
INTRAMUSCULAR | Status: AC
  Filled 2021-11-25: qty 20

## 2021-11-25 MED ORDER — ACETAMINOPHEN 500 MG PO TABS
1000.0000 mg | ORAL_TABLET | Freq: Once | ORAL | Status: AC
  Administered 2021-11-25: 1000 mg via ORAL
  Filled 2021-11-25: qty 2

## 2021-11-25 MED ORDER — DEXAMETHASONE SODIUM PHOSPHATE 10 MG/ML IJ SOLN
INTRAMUSCULAR | Status: DC | PRN
  Administered 2021-11-25: 10 mg via INTRAVENOUS

## 2021-11-25 MED ORDER — PROPOFOL 10 MG/ML IV BOLUS
INTRAVENOUS | Status: AC
  Filled 2021-11-25: qty 20

## 2021-11-25 MED ORDER — AMLODIPINE BESYLATE 5 MG PO TABS
5.0000 mg | ORAL_TABLET | Freq: Every day | ORAL | Status: DC
  Administered 2021-11-25 – 2021-11-26 (×2): 5 mg via ORAL
  Filled 2021-11-25 (×2): qty 1

## 2021-11-25 MED ORDER — OXYCODONE HCL 5 MG PO TABS: 5.0000 mg | ORAL_TABLET | ORAL | 0 refills | Status: DC | PRN

## 2021-11-25 MED ORDER — ACETAMINOPHEN 10 MG/ML IV SOLN
1000.0000 mg | Freq: Four times a day (QID) | INTRAVENOUS | Status: AC
  Administered 2021-11-25 – 2021-11-26 (×4): 1000 mg via INTRAVENOUS
  Filled 2021-11-25 (×4): qty 100

## 2021-11-25 MED ORDER — PROPOFOL 500 MG/50ML IV EMUL
INTRAVENOUS | Status: DC | PRN
  Administered 2021-11-25: 50 ug/kg/min via INTRAVENOUS

## 2021-11-25 MED ORDER — LIDOCAINE 2% (20 MG/ML) 5 ML SYRINGE
INTRAMUSCULAR | Status: DC | PRN
  Administered 2021-11-25: 100 mg via INTRAVENOUS

## 2021-11-25 MED ORDER — ONDANSETRON HCL 4 MG/2ML IJ SOLN
INTRAMUSCULAR | Status: DC | PRN
  Administered 2021-11-25: 4 mg via INTRAVENOUS

## 2021-11-25 MED ORDER — INSULIN ASPART 100 UNIT/ML IJ SOLN
INTRAMUSCULAR | Status: AC
  Filled 2021-11-25: qty 1

## 2021-11-25 MED ORDER — KETAMINE HCL 10 MG/ML IJ SOLN
INTRAMUSCULAR | Status: AC
  Filled 2021-11-25: qty 1

## 2021-11-25 MED ORDER — EPHEDRINE 5 MG/ML INJ
INTRAVENOUS | Status: AC
  Filled 2021-11-25: qty 5

## 2021-11-25 MED ORDER — LIDOCAINE HCL (PF) 2 % IJ SOLN
INTRAMUSCULAR | Status: AC
  Filled 2021-11-25: qty 5

## 2021-11-25 MED ORDER — PANTOPRAZOLE SODIUM 40 MG PO TBEC
40.0000 mg | DELAYED_RELEASE_TABLET | Freq: Every day | ORAL | Status: DC
  Administered 2021-11-26: 40 mg via ORAL
  Filled 2021-11-25: qty 1

## 2021-11-25 SURGICAL SUPPLY — 68 items
ADH SKN CLS APL DERMABOND .7 (GAUZE/BANDAGES/DRESSINGS) ×1
APL ESCP 34 STRL LF DISP (HEMOSTASIS)
APL PRP STRL LF DISP 70% ISPRP (MISCELLANEOUS) ×1
APL SRG 38 LTWT LNG FL B (MISCELLANEOUS)
APPLICATOR ARISTA FLEXITIP XL (MISCELLANEOUS) IMPLANT
APPLICATOR SURGIFLO ENDO (HEMOSTASIS) IMPLANT
APPLIER CLIP ROT 10 11.4 M/L (STAPLE)
APR CLP MED LRG 11.4X10 (STAPLE)
BAG COUNTER SPONGE SURGICOUNT (BAG) ×2 IMPLANT
BAG LAPAROSCOPIC 12 15 PORT 16 (BASKET) IMPLANT
BAG RETRIEVAL 12/15 (BASKET) ×1
BAG SPEC THK2 15X12 ZIP CLS (MISCELLANEOUS) ×1
BAG SPNG CNTER NS LX DISP (BAG)
BAG ZIPLOCK 12X15 (MISCELLANEOUS) ×2 IMPLANT
BLADE EXTENDED COATED 6.5IN (ELECTRODE) IMPLANT
BLADE SURG SZ10 CARB STEEL (BLADE) IMPLANT
CHLORAPREP W/TINT 26 (MISCELLANEOUS) ×2 IMPLANT
CLIP APPLIE ROT 10 11.4 M/L (STAPLE) IMPLANT
CLIP LIGATING HEM O LOK PURPLE (MISCELLANEOUS) ×2 IMPLANT
CLIP LIGATING HEMO LOK XL GOLD (MISCELLANEOUS) ×2 IMPLANT
CLIP LIGATING HEMO O LOK GREEN (MISCELLANEOUS) IMPLANT
CUTTER FLEX LINEAR 45M (STAPLE) IMPLANT
DERMABOND IMPLANT
DERMABOND ADVANCED .7 DNX12 (GAUZE/BANDAGES/DRESSINGS) ×2 IMPLANT
DRAPE INCISE IOBAN 66X45 STRL (DRAPES) ×2 IMPLANT
DRAPE WARM FLUID 44X44 (DRAPES) IMPLANT
ELECT PENCIL ROCKER SW 15FT (MISCELLANEOUS) ×2 IMPLANT
ELECT REM PT RETURN 15FT ADLT (MISCELLANEOUS) ×2 IMPLANT
GLOVE SURG LX STRL 7.5 STRW (GLOVE) ×2 IMPLANT
GOWN STRL REUS W/ TWL LRG LVL3 (GOWN DISPOSABLE) ×4 IMPLANT
GOWN STRL REUS W/TWL LRG LVL3 (GOWN DISPOSABLE) ×2
HEMOSTAT ARISTA ABSORB 3G PWDR (HEMOSTASIS) IMPLANT
HEMOSTAT SURGICEL 4X8 (HEMOSTASIS) IMPLANT
IRRIG SUCT STRYKERFLOW 2 WTIP (MISCELLANEOUS) ×1
IRRIGATION SUCT STRKRFLW 2 WTP (MISCELLANEOUS) ×2 IMPLANT
KIT BASIN OR (CUSTOM PROCEDURE TRAY) ×2 IMPLANT
KIT TURNOVER KIT A (KITS) IMPLANT
MANIFOLD NEPTUNE II (INSTRUMENTS) ×2 IMPLANT
MARKER SKIN DUAL TIP RULER LAB (MISCELLANEOUS) ×2 IMPLANT
NDL INSUFFLATION 14GA 120MM (NEEDLE) IMPLANT
NDL SPNL 22GX3.5 QUINCKE BK (NEEDLE) IMPLANT
NEEDLE INSUFFLATION 14GA 120MM (NEEDLE) IMPLANT
NEEDLE SPNL 22GX3.5 QUINCKE BK (NEEDLE) IMPLANT
PAD POSITIONING PINK XL (MISCELLANEOUS) ×2 IMPLANT
PROTECTOR NERVE ULNAR (MISCELLANEOUS) ×4 IMPLANT
RELOAD 45 VASCULAR/THIN (ENDOMECHANICALS) ×4 IMPLANT
RELOAD STAPLE 45 2.5 WHT GRN (ENDOMECHANICALS) IMPLANT
RELOAD STAPLE 45 3.5 BLU ETS (ENDOMECHANICALS) IMPLANT
RELOAD STAPLE TA45 3.5 REG BLU (ENDOMECHANICALS) IMPLANT
SCISSORS LAP 5X35 DISP (ENDOMECHANICALS) IMPLANT
SET TUBE SMOKE EVAC HIGH FLOW (TUBING) ×2 IMPLANT
SHEARS HARMONIC ACE PLUS 36CM (ENDOMECHANICALS) ×2 IMPLANT
SLEEVE Z-THREAD 12X100MM (TROCAR) IMPLANT
SLEEVE Z-THREAD 5X100MM (TROCAR) ×2 IMPLANT
SPIKE FLUID TRANSFER (MISCELLANEOUS) ×2 IMPLANT
SPONGE T-LAP 4X18 ~~LOC~~+RFID (SPONGE) IMPLANT
SUT MNCRL AB 4-0 PS2 18 (SUTURE) ×4 IMPLANT
SUT PDS AB 0 CT1 36 (SUTURE) ×2 IMPLANT
SUT VIC AB 2-0 CT1 27 (SUTURE)
SUT VIC AB 2-0 CT1 27XBRD (SUTURE) IMPLANT
SUT VICRYL 0 UR6 27IN ABS (SUTURE) ×2 IMPLANT
TAPE CLOTH 4X10 WHT NS (GAUZE/BANDAGES/DRESSINGS) IMPLANT
TOWEL OR 17X26 10 PK STRL BLUE (TOWEL DISPOSABLE) ×2 IMPLANT
TOWEL OR NON WOVEN STRL DISP B (DISPOSABLE) ×2 IMPLANT
TRAY FOLEY MTR SLVR 16FR STAT (SET/KITS/TRAYS/PACK) ×2 IMPLANT
TRAY LAPAROSCOPIC (CUSTOM PROCEDURE TRAY) ×2 IMPLANT
TROCAR Z THREAD OPTICAL 12X100 (TROCAR) IMPLANT
TROCAR Z-THREAD OPTICAL 5X100M (TROCAR) ×2 IMPLANT

## 2021-11-25 NOTE — Op Note (Signed)
Preoperative diagnosis:  Left renal cell carcinoma  Postoperative diagnosis:  same   Procedure: Laparoscopic left radical nephrectomy  Surgeon: Ardis Hughs, MD Resident Assistant: Vicente Serene  Anesthesia: General  Complications: None  Intraoperative findings:  Large hilar renal mass, complex hilum Adrenal gland taken with specimen  EBL: 26m  Specimens:  Left kidney and proximal ureter  Indication: Grace LITTRELLis a 66y.o. patient with large left hilar renal cell carcinoma .  After reviewing the management options for treatment, he elected to proceed with the above surgical procedure(s). We have discussed the potential benefits and risks of the procedure, side effects of the proposed treatment, the likelihood of the patient achieving the goals of the procedure, and any potential problems that might occur during the procedure or recuperation. Informed consent has been obtained.  Description of procedure:   A site was selected lateral to the umbilicus for placement of the camera port. This was placed using a standard open Hassan technique which allowed entry into the peritoneal cavity under direct vision and without difficulty. A 12 mm Hassan cannula was placed and a pneumoperitoneum established. The camera was then used to inspect the abdomen and there was no evidence of any intra-abdominal injuries or other abnormalities. The remaining abdominal ports were then placed. One 5 mm trocar was placed subcostal margin in the left upper quadrant, the second 5 mm trocar was placed laterally to the camera port so as to triangulate the kidney. An assistant port was then placed in between the camera and the left lateral port. The assistant port was a 12 mm port.   The white line of Toldt was incised allowing the colon to be mobilized medially and the plane between the mesocolon and the anterior layer of Gerota's fascia to be developed and the kidney exposed. The ureter and gonadal  vein were identified inferiorly and the ureter was lifted anteriorly off the psoas muscle. The gonadal vein was then dissected out inferior to the lower pole and 2 clips were placed both superiorly and inferiorly and then ligated. A second 5 mm port was placed in the left lower quadrant to help facilitate lifting up of the kidney. Dissection proceeded superiorly along the gonadal vein until the renal vein was identified. The renal hilum was then carefully isolated with a combination of blunt and sharp dissectiong allowing the renal arterial and venous structures to be separated and isolated.   The renal artery was isolated and ligated with a 45 mm Flex ETS stapler.   Patient had two renal arteries - requiring two staple loads.   The renal vein was then isolated and also ligated and divided with a 45 mm Flex ETS stapler.  A third staple load was then used which divided the adrenal gland.   Once the hilum had been ligated, dissection ensued from the inferior pole of the kidney. The ureter was transected placing 2 clips on the stay side and one on the specimen side. The lateral attachments of the kidney were then freed. Our attention was then turned to the upper pole which was dissected off of the spleen and the splenic attachments. The pancreas and colon were noted to be well away from the structures. The remaining of the posterior aspect of the attachments was then ligated using a Harmonic Scalpel. Once the kidney was freed from its attachments it was shown to medially and the vascular hilum was inspected and noted to be sufficiently hemostatic. There was slight bit of oozing from  the adrenal gland which was cauterized and then Surgicel placed over top. Insufflation was then turned down to 8 mm mercury and the kidney bed was noted to be hemostatic.   60cc of Exparel was then injected into the left anterior axillary line b/w the iliac crest and the twelfth rib under laparoscopic guidance. The layer between the  tranversus abdominus and the internal oblique was targeted.   The kidney/ureter specimen was then placed into a 12 mm Endocatch II retrieval bag, this was passed through the port site of the assistant port and left lower quadrant. The trochars were then removed under visual guidance to ensure no ongoing port site bleeding was occurring. The extraction incision was extended from the 12 mm left lower quadrant port. The external oblique and internal oblique muscles were spread as best as possible with as little muscle fibers ligated as possible in order to safely extracted the specimen. The internal oblique flash of was then closed with 2-0 Vicryl in an interrupted figure-of-eight fashion. The external oblique fascia was closed with a 0 looped PDS. The camera port was then closed with 2-0 Vicryl the level of the fascia. All incisions were injected with Exparel and reapproximated at the skin with 4-0 monocryl sutures. Dermabond was applied to the skin. The patient tolerated the procedure well and without complications and was transferred to the recovery unit in satisfactory condition.

## 2021-11-25 NOTE — Anesthesia Postprocedure Evaluation (Signed)
Anesthesia Post Note  Patient: Grace [facility]  Procedure(s) Performed: LEFT LAPAROSCOPIC RADICAL NEPHRECTOMY (Left)     Patient location during evaluation: PACU Anesthesia Type: General Level of consciousness: sedated Pain management: pain level controlled Vital Signs Assessment: post-procedure vital signs reviewed and stable Respiratory status: spontaneous breathing and respiratory function stable Cardiovascular status: stable Postop Assessment: no apparent nausea or vomiting Anesthetic complications: no   No notable events documented.  Last Vitals:  Vitals:   11/25/21 1300 11/25/21 1315  BP: (!) 114/49 (!) 117/98  Pulse: 74 74  Resp: 17 (!) 23  Temp:    SpO2: 91% 91%    Last Pain:  Vitals:   11/25/21 1300  TempSrc:   PainSc: 0-No pain                 Kiyra Slaubaugh DANIEL

## 2021-11-25 NOTE — Progress Notes (Signed)
MD paged.  The site around the incisions on her left flank is swollen and enlarged.  More than the right flank.  Waiting on a call back.  Will continue to monitor and notify for further changes.

## 2021-11-25 NOTE — Transfer of Care (Signed)
Immediate Anesthesia Transfer of Care Note  Patient: Grace Hess  Procedure(s) Performed: LEFT LAPAROSCOPIC RADICAL NEPHRECTOMY (Left)  Patient Location: PACU  Anesthesia Type:General  Level of Consciousness: sedated  Airway & Oxygen Therapy: Patient Spontanous Breathing and Patient connected to face mask oxygen  Post-op Assessment: Report given to RN and Post -op Vital signs reviewed and stable  Post vital signs: Reviewed and stable  Last Vitals:  Vitals Value Taken Time  BP 136/61 11/25/21 1155  Temp    Pulse 72 11/25/21 1157  Resp 18 11/25/21 1157  SpO2 96 % 11/25/21 1157  Vitals shown include unvalidated device data.  Last Pain:  Vitals:   11/25/21 0653  TempSrc: Oral  PainSc: 2       Patients Stated Pain Goal: 2 (67/54/49 2010)  Complications: No notable events documented.

## 2021-11-25 NOTE — Progress Notes (Signed)
Attempted to call a cell phone number that we have written down for Dr. Louis Meckel.  Abdomen is unchanged currently but we want to let him know about it.

## 2021-11-25 NOTE — Progress Notes (Signed)
Notified MDA CBG 260.  MDA wants to use floor sliding scale.  Will use and continue to monitor.

## 2021-11-25 NOTE — Anesthesia Procedure Notes (Signed)
Procedure Name: Intubation Date/Time: 11/25/2021 8:29 AM  Performed by: Lind Covert, CRNAPre-anesthesia Checklist: Patient identified, Emergency Drugs available, Suction available, Patient being monitored and Timeout performed Patient Re-evaluated:Patient Re-evaluated prior to induction Oxygen Delivery Method: Circle system utilized Preoxygenation: Pre-oxygenation with 100% oxygen Induction Type: IV induction Ventilation: Mask ventilation without difficulty Laryngoscope Size: Mac and 3 Grade View: Grade I Tube size: 7.0 mm Number of attempts: 1 Airway Equipment and Method: Stylet Placement Confirmation: ETT inserted through vocal cords under direct vision, positive ETCO2 and breath sounds checked- equal and bilateral Secured at: 22 cm Tube secured with: Tape Dental Injury: Teeth and Oropharynx as per pre-operative assessment

## 2021-11-25 NOTE — Interval H&P Note (Signed)
History and Physical Interval Note:  11/25/2021 8:25 AM  Grace Hess  has presented today for surgery, with the diagnosis of LEFT RENAL MASS.  The various methods of treatment have been discussed with the patient and family. After consideration of risks, benefits and other options for treatment, the patient has consented to  Procedure(s): LEFT LAPAROSCOPIC RADICAL NEPHRECTOMY (Left) as a surgical intervention.  The patient's history has been reviewed, patient examined, no change in status, stable for surgery.  I have reviewed the patient's chart and labs.  Questions were answered to the patient's satisfaction.     Ardis Hughs

## 2021-11-25 NOTE — Plan of Care (Signed)
  Problem: Education: Goal: Knowledge of General Education information will improve Description Including pain rating scale, medication(s)/side effects and non-pharmacologic comfort measures Outcome: Progressing   

## 2021-11-25 NOTE — Discharge Summary (Signed)
[facility] Discharge Summary  Admit date: 11/25/2021  Discharge date and time: 11/26/21   Discharge to: Home  Discharge Service: Urology  Discharge Attending Physician:  Dr. Louis Meckel, MD  Discharge  Diagnoses: Renal cell adenocarcinoma [facility])  Secondary Diagnosis: Principal Problem:   Renal cell adenocarcinoma ([place])   OR Procedures: Procedure(s): LEFT LAPAROSCOPIC RADICAL NEPHRECTOMY 11/25/2021   Ancillary Procedures: None   Discharge Day Services: The patient was seen and examined by the Urology team both in the morning and immediately prior to discharge.  Vital signs and laboratory values were stable and within normal limits.  The physical exam was benign and unchanged and all surgical wounds were examined.  Discharge instructions were explained and all questions answered.  Subjective  No acute events overnight. Pain Controlled. No fever or chills.  Objective Patient Vitals for the past 8 hrs:  BP Temp Temp src Pulse Resp SpO2  11/26/21 0900 -- -- -- 79 -- 90 %  11/26/21 0543 (!) 119/53 97.9 F (36.6 C) Oral 64 16 96 %   No intake/output data recorded.  General Appearance:        No acute distress Lungs:                       Normal work of breathing on room air Heart:                                Regular rate and rhythm Abdomen:                         Soft, appropriately-tender, non-distended. Incisions c/d/I with dermabond in place Extremities:                      Warm and well perfused   Hospital Course:  The patient is a 66 year old female with history of large left hilar renal cell carcinoma.   The patient underwent laparoscopic left radical nephrectomy on 11/25/2021.  The patient tolerated the procedure well, was extubated in the OR, and afterwards was taken to the PACU for routine post-surgical care. When stable the patient was transferred to the floor.   The patient did well postoperatively.  The patient's diet was slowly advanced and at the  time of discharge was tolerating a regular diet.  The patient was discharged home 1 Day Post-Op, at which point was tolerating a regular solid diet, was able to void spontaneously, have adequate pain control with P.O. pain medication, and could ambulate without difficulty. The patient will follow up with [place] for post op check. She will resume her plavix on 11/29/21.  Condition at Discharge: Improved  Discharge Medications:  Allergies as of 11/26/2021       Reactions   Codeine Shortness Of Breath   Latex Itching   Sulfa Antibiotics Other (See Comments)   Reaction:  Unknown         Medication List     TAKE these medications    Accu-Chek Guide test strip Generic drug: glucose blood Use as instructed   Accu-Chek Guide w/Device Kit 1 Piece by Does not apply route as directed.   acetaminophen 325 MG tablet Commonly known as: TYLENOL Take 2 tablets (650 mg total) by mouth every 4 (four) hours as needed for headache or mild pain. What changed: how much to take   albuterol 108 (90 Base) MCG/ACT inhaler  Commonly known as: VENTOLIN HFA Inhale 1-2 puffs into the lungs every 4 (four) hours as needed for shortness of breath or wheezing.   ALPRAZolam 0.5 MG tablet Commonly known as: XANAX Take 0.25 mg by mouth 2 (two) times daily.   amLODipine 5 MG tablet Commonly known as: NORVASC Take 1 tablet (5 mg total) by mouth daily.   aspirin EC 81 MG tablet Take 81 mg by mouth daily as needed (Chest pains). Swallow whole.   benzonatate 100 MG capsule Commonly known as: TESSALON Take 100 mg by mouth 4 (four) times daily as needed.   Biotin 5000 MCG Caps Take 5,000 mcg by mouth once a week.   clopidogrel 75 MG tablet Commonly known as: PLAVIX Take 1 tablet (75 mg total) by mouth daily.   escitalopram 20 MG tablet Commonly known as: LEXAPRO Take 20 mg by mouth daily.   gabapentin 300 MG capsule Commonly known as: NEURONTIN Take 300 mg by mouth at bedtime.   glipiZIDE 2.5 MG 24  hr tablet Commonly known as: GLUCOTROL XL TAKE 1 TABLET(2.5 MG) BY MOUTH DAILY WITH BREAKFAST   metoprolol tartrate 25 MG tablet Commonly known as: LOPRESSOR Take 1 tablet (25 mg total) by mouth 2 (two) times daily.   nicotine 21 mg/24hr patch Commonly known as: NICODERM CQ - dosed in mg/24 hours Place 1 patch (21 mg total) onto the skin daily.   nystatin cream Commonly known as: MYCOSTATIN Apply 1 application  topically 2 (two) times daily as needed (Itching).   ondansetron 4 MG tablet Commonly known as: ZOFRAN TAKE 1 TABLET(4 MG) BY MOUTH TWICE DAILY AS NEEDED FOR NAUSEA OR VOMITING   oxyCODONE 5 MG immediate release tablet Commonly known as: Roxicodone Take 1 tablet (5 mg total) by mouth every 4 (four) hours as needed.   oxyCODONE-acetaminophen 5-325 MG tablet Commonly known as: PERCOCET/ROXICET Take 1 tablet by mouth 3 (three) times daily as needed for moderate pain.   Ozempic (0.25 or 0.5 MG/DOSE) 2 MG/1.5ML Sopn Generic drug: Semaglutide(0.25 or 0.5MG/DOS) INJECT 0.5MG INTO THE SKIN ONCE WEEKLY   pantoprazole 40 MG tablet Commonly known as: PROTONIX Take 1 tablet (40 mg total) by mouth daily before breakfast.   rosuvastatin 20 MG tablet Commonly known as: CRESTOR TAKE 1 TABLET(20 MG) BY MOUTH DAILY What changed: See the new instructions.   Vitamin D3 125 MCG (5000 UT) Caps Take 5,000 Units by mouth once a week. Tuesday

## 2021-11-25 NOTE — Discharge Instructions (Addendum)
Restart your Plavix on 11/29/2021.  Driving:  It is against the law to drive when taking narcotic pain medications.  You should wait at least 8 hours after taking your last pain pill before driving.  Further, you should not drive if you are to sore to react quickly or if you have something impeding your ability to drive.   Activity:  You are encouraged to ambulate frequently (about every hour during waking hours) to help prevent blood clots from forming in your legs or lungs.  However, you should not engage in any heavy lifting (> 10-15 lbs), strenuous activity, or straining.  Diet: You should advance your diet as instructed by your physician.  It will be normal to have some bloating, nausea, and abdominal discomfort intermittently.  Prescriptions:  You will be provided a prescription for pain medication to take as needed.  If your pain is not severe enough to require the prescription pain medication, you may take extra strength Tylenol instead which will have less side effects.  You should also take a prescribed stool softener to avoid straining with bowel movements as the prescription pain medication may constipate you.  Incisions: You may remove your dressing bandages 48 hours after surgery if not removed in the hospital.  You will either have some small staples or special tissue glue at each of the incision sites. Once the bandages are removed (if present), the incisions may stay open to air.  You may start showering (but not soaking or bathing in water) the 2nd day after surgery and the incisions simply need to be patted dry after the shower.  No additional care is needed.  What to call us about: You should call the office [phone]) if you develop fever > 101 or develop persistent vomiting. Activity:  You are encouraged to ambulate frequently (about every hour during waking hours) to help prevent blood clots from forming in your legs or lungs.  However, you should not engage in any heavy lifting  (> 10-15 lbs), strenuous activity, or straining.

## 2021-11-25 NOTE — Progress Notes (Signed)
MD to bedside.  Ordered to put ice on abdomen but no other orders at this time.  Will continue to monitor and notify for changes.

## 2021-11-25 NOTE — Progress Notes (Signed)
Ice applied to abdomen.

## 2021-11-25 NOTE — H&P (Signed)
66 year old female presents today for further discussion of management of her left renal mass.   The patient underwent a CT, none contrast, of the abdomen and pelvis in May 2023 for flank pain with associated nausea and vomiting. She was noted to have a 5.3 cm enhancing renal mass its central around the hilum emanating from the lower pole and appears to abut the left ureter. A MRI was subsequently performed and the impression was that this was most consistent with urothelial carcinoma. The patient was subsequently taken to the operating room for ureteroscopy and possible biopsy. This demonstrated a normal collecting system with extrinsic compression of the collecting system presumably from the renal mass. 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 She then underwent a PET CT scan which demonstrated no evidence of metastatic disease. There was some concern of a abdominal wall nodule, but this was not PET avid   The patient presents today for consideration of surgical extirpation.   The patient has a history of cholecystectomy in the 70s. She also has a history of a hysterectomy and a carotid endarterectomy. Her carotid endarterectomy was in 2016. She has no history of heart attack, but did have a stent placed approximately 4 years ago. She takes Plavix for this. She is diabetic, her last A1c was 7.4.   The patient is a active smoker, smokes 2 packs of cigarettes per day. She has reasonable exercise tolerance.   The patient was also noted to have chronic renal insufficiency. She has a baseline creatinine of around 2.2. At the time of her initial evaluation prior to her stent being placed her creatinine was as high as 3. 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     ALLERGIES: Codeine - Cannot breathe Latex Sulfa    MEDICATIONS: Metformin Hcl 500 mg tablet  Metoprolol Tartrate 25 mg tablet   Plavix 75 mg tablet  Albuterol Sulfate  Amlodipine Besylate 5 mg tablet  Aspirin Regimen 81 mg tablet, delayed release  Escitalopram Oxalate 20 mg tablet  Gabapentin 300 mg capsule  Glipizide Er 2.5 mg tablet, extended release 24 hr  Ozempic 0.25 mg or 0.5 mg dose (2 mg/1.5 ml) pen injector  Rosuvastatin Calcium 20 mg tablet  Tylenol     GU PSH: None     PSH Notes: Coronary angioplasty- blocked artery- sent placement (2018) (2023), abdominal aortogram (08/02/2016)   NON-GU PSH: Partial Hysterectomy, 1991 Remove Gallbladder, 1978     GU PMH: None   NON-GU PMH: Anxiety Depression Diabetes Type 2 GERD Hypercholesterolemia Hypertension Stroke/TIA    FAMILY HISTORY: 2 sons - Son Emphysema - Father heart failure - Mother   SOCIAL HISTORY: Marital Status: Married Preferred Language: English; Ethnicity: Not Hispanic Or Latino; Race: White Current Smoking Status: Patient smokes. Has smoked since 10/13/1973. Smokes 2 packs per day.   Tobacco Use Assessment Completed: Used Tobacco in last 30 days? Patient's occupation is/was Retired.    REVIEW OF SYSTEMS:    GU Review Female:   Patient reports burning /pain with urination and get up at night to urinate. Patient denies frequent urination, hard to postpone urination, leakage of urine, stream starts and stops, trouble starting your stream, have to strain to urinate, and being pregnant.  Gastrointestinal (Upper):   Patient reports indigestion/ heartburn and nausea. Patient denies vomiting.  Gastrointestinal (Lower):   Patient denies diarrhea and constipation.  Constitutional:   Patient  reports night sweats and fatigue. Patient denies fever and weight loss.  Skin:   Patient denies skin rash/ lesion and itching.  Eyes:   Patient reports blurred vision. Patient denies double vision.  Ears/ Nose/ Throat:   Patient denies sore throat and sinus problems.  Hematologic/Lymphatic:   Patient reports easy bruising. Patient denies swollen  glands.  Cardiovascular:   Patient denies leg swelling and chest pains.  Respiratory:   Patient reports cough. Patient denies shortness of breath.  Endocrine:   Patient reports excessive thirst.   Musculoskeletal:   Patient reports back pain. Patient denies joint pain.  Neurological:   Patient reports headaches. Patient denies dizziness.  Psychologic:   Patient reports depression and anxiety.    VITAL SIGNS:      10/27/2021 01:48 PM  Weight 150 lb / 68.04 kg  Height 66 in / 167.64 cm  BP 100/56 mmHg  Pulse 64 /min  BMI 24.2 kg/m   MULTI-SYSTEM PHYSICAL EXAMINATION:    Constitutional: Well-nourished. No physical deformities. Normally developed. Good grooming.  Neck: Neck symmetrical, not swollen. Normal tracheal position.  Respiratory: Normal breath sounds. No labored breathing, no use of accessory muscles.   Cardiovascular: Regular rate and rhythm. No murmur, no gallop. Normal temperature, normal extremity pulses, no swelling, no varicosities.   Lymphatic: No enlargement of neck, axillae, groin.  Skin: No paleness, no jaundice, no cyanosis. No lesion, no ulcer, no rash.  Neurologic / Psychiatric: Oriented to time, oriented to place, oriented to person. No depression, no anxiety, no agitation.  Gastrointestinal: No mass, no tenderness, no rigidity, non obese abdomen.  Eyes: Normal conjunctivae. Normal eyelids.  Ears, Nose, Mouth, and Throat: Left ear no scars, no lesions, no masses. Right ear no scars, no lesions, no masses. Nose no scars, no lesions, no masses. Normal hearing. Normal lips.  Musculoskeletal: Normal gait and station of head and neck.     Complexity of Data:  Source Of History:  Patient  Records Review:   Pathology Reports, Previous Doctor Records, Previous Patient Records, POC Tool  Urine Test Review:   Urinalysis  X-Ray Review: C.T. Abdomen/Pelvis: Reviewed Films. Discussed With Patient.  MRI Abdomen: Reviewed Films. Discussed With Patient.     PROCEDURES:           Urinalysis w/Scope Dipstick Dipstick Cont'd Micro  Color: Yellow Bilirubin: Neg mg/dL WBC/hpf: 6 - 10/hpf  Appearance: Slightly Cloudy Ketones: Neg mg/dL RBC/hpf: 0 - 2/hpf  Specific Gravity: 1.025 Blood: Neg ery/uL Bacteria: Mod (26-50/hpf)  pH: 5.5 Protein: Trace mg/dL Cystals: NS (Not Seen)  Glucose: Neg mg/dL Urobilinogen: 1.0 mg/dL Casts: WBC    Nitrites: Neg Trichomonas: Not Present    Leukocyte Esterase: Neg leu/uL Mucous: Present      Epithelial Cells: 6 - 10/hpf      Yeast: NS (Not Seen)      Sperm: Not Present    Notes: rental tubular cells present    ASSESSMENT:      ICD-10 Details  1 GU:   Renal cell carcinoma, left - C64.2    PLAN:           Schedule Return Visit/Planned Activity: ASAP - Schedule Surgery          Document Letter(s):  Created for Patient: Clinical Summary         Notes:   The patient has biopsy proven left renal cell carcinoma. The tumor is very central around the hilum, and was last measured at approximately 5.3 cm. There does not appear  to be any renal vein involvement. She does have a small subcutaneous nodule in the right abdominal wall that appeared over the course of the last several months, but was not PET avid.   I spoke with the patient about treatment options and strongly urged the patient to consider a laparoscopic radical nephrectomy. 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 However, I suspect that she has enough renal function from the right that she should not require dialysis following nephrectomy. However, this is definitely not a certainty, and a distinct possibility.   I detailed the various treatment options with the patient and ultimately I recommended a laparoscopic radical nephrectomy. I went over this operation in detail with the patient including the risks and benefits. We discussed port position and I outlined the position of the 4 planned trocars. I also explained  to him that he will need extraction incision which typically is in the left lower quadrant. I discussed the actual surgery with him and we went over the various structures better intimate association with the kidney and the risk of damage thereof. I outlined the risk of injury to the major nerves and vessels in proximity to the kidney. I explained the patient the expected hospital course. I told him that he should plan to be in the hospital at least 2-3 days. Further, I told him that he would likely need approximately 1 month to fully recover. Given the severity of this planned operation, we will have the patient be seen by his primary care doctor and cleared for surgery. We will plan to schedule this within the next month.

## 2021-11-26 ENCOUNTER — Encounter (HOSPITAL_COMMUNITY): Payer: Self-pay | Admitting: Urology

## 2021-11-26 LAB — BASIC METABOLIC PANEL
Anion gap: 7 (ref 5–15)
BUN: 49 mg/dL — ABNORMAL HIGH (ref 8–23)
CO2: 20 mmol/L — ABNORMAL LOW (ref 22–32)
Calcium: 8.6 mg/dL — ABNORMAL LOW (ref 8.9–10.3)
Chloride: 105 mmol/L (ref 98–111)
Creatinine, Ser: 2.58 mg/dL — ABNORMAL HIGH (ref 0.44–1.00)
GFR, Estimated: 20 mL/min — ABNORMAL LOW (ref >60)
Glucose, Bld: 257 mg/dL — ABNORMAL HIGH (ref 70–99)
Potassium: 5.4 mmol/L — ABNORMAL HIGH (ref 3.5–5.1)
Sodium: 132 mmol/L — ABNORMAL LOW (ref 135–145)

## 2021-11-26 LAB — GLUCOSE, CAPILLARY
Glucose-Capillary: 225 mg/dL — ABNORMAL HIGH (ref 70–99)
Glucose-Capillary: 222 mg/dL — ABNORMAL HIGH (ref 70–99)

## 2021-11-26 LAB — HEMOGLOBIN AND HEMATOCRIT, BLOOD
HCT: 33.3 % — ABNORMAL LOW (ref 36.0–46.0)
Hemoglobin: 10.7 g/dL — ABNORMAL LOW (ref 12.0–15.0)

## 2021-11-26 MED ORDER — NICOTINE 21 MG/24HR TD PT24: 21.0000 mg | MEDICATED_PATCH | TRANSDERMAL | 1 refills | Status: DC

## 2021-11-26 NOTE — Progress Notes (Signed)
SATURATION QUALIFICATIONS: (This note is used to comply with regulatory documentation for home oxygen)  Patient Saturations on Room Air at Rest = 90%  Patient Saturations on Room Air while Ambulating = 83%  Patient Saturations on 2 Liters of oxygen while Ambulating = 93%  Please briefly explain why patient needs home oxygen:  desats with ambulation

## 2021-11-26 NOTE — Progress Notes (Signed)
  Transition of Care [facility]) Screening Note   Patient Details  Name: Grace Hess Date of Birth: 04/09/1955   [facility] CM/SW Contact:    Dessa Phi, RN Phone Number: 11/26/2021, 11:09 AM    Transition of Care Department [facility]) has reviewed patient and no TOC needs have been identified at this time. We will continue to monitor patient advancement through interdisciplinary progression rounds. If new patient transition needs arise, please place a TOC consult.

## 2021-11-26 NOTE — Plan of Care (Signed)
  Problem: Metabolic: Goal: Ability to maintain appropriate glucose levels will improve Outcome: Progressing   Problem: Clinical Measurements: Goal: Diagnostic test results will improve Outcome: Progressing Goal: Respiratory complications will improve Outcome: Progressing   Problem: Activity: Goal: Risk for activity intolerance will decrease Outcome: Progressing   Problem: Education: Goal: Ability to describe self-care measures that may prevent or decrease complications (Diabetes Survival Skills Education) will improve Outcome: Adequate for Discharge   Problem: Coping: Goal: Ability to adjust to condition or change in health will improve Outcome: Adequate for Discharge   Problem: Fluid Volume: Goal: Ability to maintain a balanced intake and output will improve Outcome: Adequate for Discharge   Problem: Health Behavior/Discharge Planning: Goal: Ability to identify and utilize available resources and services will improve Outcome: Adequate for Discharge Goal: Ability to manage health-related needs will improve Outcome: Adequate for Discharge   Problem: Nutritional: Goal: Maintenance of adequate nutrition will improve Outcome: Adequate for Discharge Goal: Progress toward achieving an optimal weight will improve Outcome: Adequate for Discharge   Problem: Skin Integrity: Goal: Risk for impaired skin integrity will decrease Outcome: Adequate for Discharge   Problem: Tissue Perfusion: Goal: Adequacy of tissue perfusion will improve Outcome: Adequate for Discharge   Problem: Clinical Measurements: Goal: Cardiovascular complication will be avoided Outcome: Adequate for Discharge   Problem: Nutrition: Goal: Adequate nutrition will be maintained Outcome: Adequate for Discharge   Problem: Coping: Goal: Level of anxiety will decrease Outcome: Adequate for Discharge   Problem: Elimination: Goal: Will not experience complications related to bowel motility Outcome:  Adequate for Discharge Goal: Will not experience complications related to urinary retention Outcome: Adequate for Discharge   Problem: Pain Managment: Goal: General experience of comfort will improve Outcome: Adequate for Discharge   Problem: Safety: Goal: Ability to remain free from injury will improve Outcome: Adequate for Discharge   Problem: Skin Integrity: Goal: Risk for impaired skin integrity will decrease Outcome: Adequate for Discharge   Problem: Education: Goal: Knowledge of the prescribed therapeutic regimen will improve Outcome: Adequate for Discharge   Problem: Bowel/Gastric: Goal: Gastrointestinal status for postoperative course will improve Outcome: Adequate for Discharge   Problem: Clinical Measurements: Goal: Postoperative complications will be avoided or minimized Outcome: Adequate for Discharge   Problem: Respiratory: Goal: Ability to achieve and maintain a regular respiratory rate will improve Outcome: Adequate for Discharge   Problem: Skin Integrity: Goal: Demonstration of wound healing without infection will improve Outcome: Adequate for Discharge   Problem: Urinary Elimination: Goal: Ability to avoid or minimize complications of infection will improve Outcome: Adequate for Discharge Goal: Ability to achieve and maintain urine output will improve Outcome: Adequate for Discharge   Problem: Education: Goal: Knowledge of General Education information will improve Description: Including pain rating scale, medication(s)/side effects and non-pharmacologic comfort measures Outcome: Completed/Met

## 2021-11-26 NOTE — Plan of Care (Signed)
Problem: Education: Goal: Ability to describe self-care measures that may prevent or decrease complications (Diabetes Survival Skills Education) will improve 11/26/2021 1555 by Vella Raring, RN Outcome: Progressing 11/26/2021 1302 by Vella Raring, RN Outcome: Adequate for Discharge   Problem: Coping: Goal: Ability to adjust to condition or change in health will improve 11/26/2021 1555 by Vella Raring, RN Outcome: Progressing 11/26/2021 1302 by Vella Raring, RN Outcome: Adequate for Discharge   Problem: Health Behavior/Discharge Planning: Goal: Ability to identify and utilize available resources and services will improve 11/26/2021 1555 by Vella Raring, RN Outcome: Progressing 11/26/2021 1302 by Vella Raring, RN Outcome: Adequate for Discharge Goal: Ability to manage health-related needs will improve 11/26/2021 1555 by Vella Raring, RN Outcome: Progressing 11/26/2021 1302 by Vella Raring, RN Outcome: Adequate for Discharge   Problem: Metabolic: Goal: Ability to maintain appropriate glucose levels will improve 11/26/2021 1555 by Vella Raring, RN Outcome: Progressing 11/26/2021 1302 by Vella Raring, RN Outcome: Progressing   Problem: Skin Integrity: Goal: Risk for impaired skin integrity will decrease 11/26/2021 1555 by Vella Raring, RN Outcome: Progressing 11/26/2021 1302 by Vella Raring, RN Outcome: Adequate for Discharge   Problem: Clinical Measurements: Goal: Ability to maintain clinical measurements within normal limits will improve Outcome: Progressing Goal: Diagnostic test results will improve Outcome: Progressing Goal: Respiratory complications will improve 11/26/2021 1555 by Vella Raring, RN Outcome: Progressing 11/26/2021 1302 by Vella Raring, RN Outcome: Progressing   Problem: Activity: Goal: Risk for activity intolerance will decrease Outcome: Progressing   Problem: Nutrition: Goal: Adequate nutrition will be  maintained 11/26/2021 1555 by Vella Raring, RN Outcome: Progressing 11/26/2021 1302 by Vella Raring, RN Outcome: Adequate for Discharge   Problem: Pain Managment: Goal: General experience of comfort will improve 11/26/2021 1555 by Vella Raring, RN Outcome: Progressing 11/26/2021 1302 by Vella Raring, RN Outcome: Adequate for Discharge   Problem: Respiratory: Goal: Ability to achieve and maintain a regular respiratory rate will improve 11/26/2021 1555 by Vella Raring, RN Outcome: Progressing 11/26/2021 1302 by Vella Raring, RN Outcome: Adequate for Discharge   Problem: Urinary Elimination: Goal: Ability to avoid or minimize complications of infection will improve 11/26/2021 1555 by Vella Raring, RN Outcome: Progressing 11/26/2021 1302 by Vella Raring, RN Outcome: Adequate for Discharge   Problem: Nutritional: Goal: Progress toward achieving an optimal weight will improve Outcome: Adequate for Discharge   Problem: Tissue Perfusion: Goal: Adequacy of tissue perfusion will improve Outcome: Adequate for Discharge   Problem: Clinical Measurements: Goal: Cardiovascular complication will be avoided Outcome: Adequate for Discharge   Problem: Coping: Goal: Level of anxiety will decrease Outcome: Adequate for Discharge   Problem: Elimination: Goal: Will not experience complications related to bowel motility Outcome: Adequate for Discharge Goal: Will not experience complications related to urinary retention Outcome: Adequate for Discharge   Problem: Safety: Goal: Ability to remain free from injury will improve Outcome: Adequate for Discharge   Problem: Skin Integrity: Goal: Risk for impaired skin integrity will decrease Outcome: Adequate for Discharge   Problem: Education: Goal: Knowledge of the prescribed therapeutic regimen will improve Outcome: Adequate for Discharge   Problem: Bowel/Gastric: Goal: Gastrointestinal status for postoperative course  will improve Outcome: Adequate for Discharge   Problem: Clinical Measurements: Goal: Postoperative complications will be avoided or minimized Outcome: Adequate for Discharge   Problem: Skin Integrity: Goal: Demonstration of wound healing without infection will improve Outcome: Adequate for Discharge  Problem: Urinary Elimination: Goal: Ability to achieve and maintain urine output will improve Outcome: Adequate for Discharge   Problem: Fluid Volume: Goal: Ability to maintain a balanced intake and output will improve 11/26/2021 1555 by Vella Raring, RN Outcome: Completed/Met 11/26/2021 1302 by Vella Raring, RN Outcome: Adequate for Discharge   Problem: Nutritional: Goal: Maintenance of adequate nutrition will improve 11/26/2021 1555 by Vella Raring, RN Outcome: Completed/Met 11/26/2021 1302 by Vella Raring, RN Outcome: Adequate for Discharge   Problem: Education: Goal: Knowledge of General Education information will improve Description: Including pain rating scale, medication(s)/side effects and non-pharmacologic comfort measures Outcome: Completed/Met

## 2021-11-26 NOTE — TOC Transition Note (Signed)
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 No further CM needs.        Barriers to Discharge: No Barriers Identified   Patient Goals and CMS Choice        Discharge Placement                       Discharge Plan and Services   Discharge Planning Services: CM Consult            DME Arranged: Oxygen DME Agency: [employer] Date DME Agency Contacted: 11/26/21 Time DME Agency Contacted: [id number] Representative spoke with at DME Agency: [place] ([place]) Interventions     Readmission Risk Interventions     No data to display

## 2021-11-26 NOTE — Inpatient Diabetes Management (Signed)
Inpatient Diabetes Program Recommendations  AACE/ADA: New Consensus Statement on Inpatient Glycemic Control (2015)  Target Ranges:  Prepandial:   less than 140 mg/dL      Peak postprandial:   less than 180 mg/dL (1-2 hours)      Critically ill patients:  140 - 180 mg/dL   Lab Results  Component Value Date   GLUCAP 225 (H) 11/26/2021   HGBA1C 8.5 (H) 11/19/2021    Review of Glycemic Control  Latest Reference Range & Units 11/25/21 06:51 11/25/21 11:56 11/25/21 16:41 11/25/21 21:03 11/26/21 07:33  Glucose-Capillary 70 - 99 mg/dL 198 (H) 260 (H) 245 (H) 382 (H) 225 (H)  (H): Data is abnormally high  Diabetes history:  DM2  Outpatient Diabetes medications:  Glipizide 2.5 mg QD Ozempic weekly  Current orders for Inpatient glycemic control:  Novlog 0-15 units TID and 0-5 units QHS Decadron 10 mg on 7/13  Inpatient Diabetes Program Recommendations:    Novolog 3 units TID with meals if consumes at least 50%.  Will continue to follow while inpatient.  Thank you, Reche Dixon, MSN, [place] Diabetes Coordinator Inpatient Diabetes Program [phone] (team pager from 8a-5p)

## 2021-11-28 ENCOUNTER — Emergency Department (HOSPITAL_COMMUNITY): Payer: HMO

## 2021-11-28 ENCOUNTER — Emergency Department (HOSPITAL_COMMUNITY)
Admission: EM | Admit: 2021-11-28 | Discharge: 2021-11-28 | Disposition: A | Discharge status: Home / Self Care | Payer: HMO | Attending: Emergency Medicine | Admitting: Emergency Medicine

## 2021-11-28 ENCOUNTER — Other Ambulatory Visit: Payer: Self-pay

## 2021-11-28 ENCOUNTER — Encounter (HOSPITAL_COMMUNITY): Payer: Self-pay | Admitting: *Deleted

## 2021-11-28 DIAGNOSIS — Z20822 Contact with and (suspected) exposure to covid-19: Secondary | ICD-10-CM | POA: Diagnosis not present

## 2021-11-28 DIAGNOSIS — N3001 Acute cystitis with hematuria: Secondary | ICD-10-CM | POA: Diagnosis not present

## 2021-11-28 DIAGNOSIS — N179 Acute kidney failure, unspecified: Secondary | ICD-10-CM | POA: Diagnosis not present

## 2021-11-28 DIAGNOSIS — F17210 Nicotine dependence, cigarettes, uncomplicated: Secondary | ICD-10-CM | POA: Insufficient documentation

## 2021-11-28 DIAGNOSIS — Z859 Personal history of malignant neoplasm, unspecified: Secondary | ICD-10-CM | POA: Diagnosis not present

## 2021-11-28 DIAGNOSIS — R0609 Other forms of dyspnea: Secondary | ICD-10-CM | POA: Insufficient documentation

## 2021-11-28 DIAGNOSIS — N3289 Other specified disorders of bladder: Secondary | ICD-10-CM | POA: Diagnosis not present

## 2021-11-28 DIAGNOSIS — Z9049 Acquired absence of other specified parts of digestive tract: Secondary | ICD-10-CM | POA: Diagnosis not present

## 2021-11-28 DIAGNOSIS — R079 Chest pain, unspecified: Secondary | ICD-10-CM | POA: Insufficient documentation

## 2021-11-28 DIAGNOSIS — Z85528 Personal history of other malignant neoplasm of kidney: Secondary | ICD-10-CM | POA: Diagnosis not present

## 2021-11-28 DIAGNOSIS — Z905 Acquired absence of kidney: Secondary | ICD-10-CM | POA: Insufficient documentation

## 2021-11-28 DIAGNOSIS — R0789 Other chest pain: Secondary | ICD-10-CM | POA: Diagnosis not present

## 2021-11-28 DIAGNOSIS — Z8673 Personal history of transient ischemic attack (TIA), and cerebral infarction without residual deficits: Secondary | ICD-10-CM | POA: Diagnosis not present

## 2021-11-28 DIAGNOSIS — I10 Essential (primary) hypertension: Secondary | ICD-10-CM | POA: Diagnosis not present

## 2021-11-28 DIAGNOSIS — Z7982 Long term (current) use of aspirin: Secondary | ICD-10-CM | POA: Insufficient documentation

## 2021-11-28 DIAGNOSIS — K439 Ventral hernia without obstruction or gangrene: Secondary | ICD-10-CM | POA: Diagnosis not present

## 2021-11-28 DIAGNOSIS — Z7902 Long term (current) use of antithrombotics/antiplatelets: Secondary | ICD-10-CM | POA: Insufficient documentation

## 2021-11-28 DIAGNOSIS — Z79899 Other long term (current) drug therapy: Secondary | ICD-10-CM | POA: Diagnosis not present

## 2021-11-28 DIAGNOSIS — N200 Calculus of kidney: Secondary | ICD-10-CM | POA: Diagnosis not present

## 2021-11-28 DIAGNOSIS — Z955 Presence of coronary angioplasty implant and graft: Secondary | ICD-10-CM | POA: Insufficient documentation

## 2021-11-28 DIAGNOSIS — R0602 Shortness of breath: Secondary | ICD-10-CM | POA: Diagnosis not present

## 2021-11-28 DIAGNOSIS — E119 Type 2 diabetes mellitus without complications: Secondary | ICD-10-CM | POA: Insufficient documentation

## 2021-11-28 LAB — URINALYSIS, ROUTINE W REFLEX MICROSCOPIC
Bilirubin Urine: NEGATIVE
Glucose, UA: NEGATIVE mg/dL
Ketones, ur: NEGATIVE mg/dL
Nitrite: NEGATIVE
Protein, ur: 30 mg/dL — AB
Specific Gravity, Urine: 1.013 (ref 1.005–1.030)
WBC, UA: >50 WBC/hpf — ABNORMAL HIGH (ref 0–5)
pH: 6 (ref 5.0–8.0)

## 2021-11-28 LAB — CBC
HCT: 36.2 % (ref 36.0–46.0)
Hemoglobin: 11.4 g/dL — ABNORMAL LOW (ref 12.0–15.0)
MCH: 29.7 pg (ref 26.0–34.0)
MCHC: 31.5 g/dL (ref 30.0–36.0)
MCV: 94.3 fL (ref 80.0–100.0)
Platelets: 223 10*3/uL (ref 150–400)
RBC: 3.84 MIL/uL — ABNORMAL LOW (ref 3.87–5.11)
RDW: 14.7 % (ref 11.5–15.5)
WBC: 13.1 10*3/uL — ABNORMAL HIGH (ref 4.0–10.5)
nRBC: 0 % (ref 0.0–0.2)

## 2021-11-28 LAB — BASIC METABOLIC PANEL
Anion gap: 9 (ref 5–15)
BUN: 65 mg/dL — ABNORMAL HIGH (ref 8–23)
CO2: 19 mmol/L — ABNORMAL LOW (ref 22–32)
Calcium: 8.7 mg/dL — ABNORMAL LOW (ref 8.9–10.3)
Chloride: 108 mmol/L (ref 98–111)
Creatinine, Ser: 3.34 mg/dL — ABNORMAL HIGH (ref 0.44–1.00)
GFR, Estimated: 15 mL/min — ABNORMAL LOW (ref >60)
Glucose, Bld: 151 mg/dL — ABNORMAL HIGH (ref 70–99)
Potassium: 5.2 mmol/L — ABNORMAL HIGH (ref 3.5–5.1)
Sodium: 136 mmol/L (ref 135–145)

## 2021-11-28 LAB — TROPONIN I (HIGH SENSITIVITY)
Troponin I (High Sensitivity): 7 ng/L (ref <18)
Troponin I (High Sensitivity): 6 ng/L (ref <18)

## 2021-11-28 LAB — RESP PANEL BY RT-PCR (FLU A&B, COVID) ARPGX2
Influenza A by PCR: NEGATIVE
Influenza B by PCR: NEGATIVE
SARS Coronavirus 2 by RT PCR: NEGATIVE

## 2021-11-28 LAB — BRAIN NATRIURETIC PEPTIDE: B Natriuretic Peptide: 578 pg/mL — ABNORMAL HIGH (ref 0.0–100.0)

## 2021-11-28 MED ORDER — SODIUM CHLORIDE 0.9 % IV BOLUS
1000.0000 mL | Freq: Once | INTRAVENOUS | Status: AC
  Administered 2021-11-28: 1000 mL via INTRAVENOUS

## 2021-11-28 MED ORDER — SODIUM ZIRCONIUM CYCLOSILICATE 5 G PO PACK
10.0000 g | PACK | Freq: Once | ORAL | Status: AC
  Administered 2021-11-28: 10 g via ORAL
  Filled 2021-11-28: qty 2

## 2021-11-28 MED ORDER — CEPHALEXIN 500 MG PO CAPS: 500.0000 mg | ORAL_CAPSULE | Freq: Four times a day (QID) | ORAL | 0 refills | Status: DC

## 2021-11-28 MED ORDER — SODIUM CHLORIDE 0.9 % IV SOLN
1.0000 g | Freq: Once | INTRAVENOUS | Status: AC
  Administered 2021-11-28: 1 g via INTRAVENOUS
  Filled 2021-11-28: qty 10

## 2021-11-28 NOTE — ED Provider Notes (Signed)
Patient with AKI and possible UTI.  Patient does not want to be admitted.  I spoke with the urologist on-call and he was comfortable with the patient getting couple liters of fluids and antibiotics and they will see her this week   Milton Ferguson, MD 11/28/21 1631

## 2021-11-28 NOTE — ED Provider Notes (Signed)
[facility] EM[facility] Provider Note   CSN: [id number] Arrival date & time: 11/28/21  1112     History {Add pertinent medical, surgical, social history, OB history to HPI:1} Chief Complaint  Patient presents with  . Chest Pain    Grace Hess is a 66 y.o. female.  Patient as above with significant medical history as below, including laproscopic nephrectomy 9/13 Dr. Louis Meckel @ [facility], CAD, GERD, tobacco abuse, HLD, HTN, DM who presents to the ED with complaint of chest pain.  Patient reports chest pain dyspnea, ongoing approximately 3 days.  Is progressively worsened over the past 24 hours.  Initially was intermittent, localized primarily to her upper chest wall, her neck or shoulders, is now progressed to her left side chest wall with some radiation to her neck has been constant since this morning.  Mild exertional dyspnea, she does have generalized fatigue, malaise.  No dyspnea at rest.  Pain described as an aching, sometimes cramping sensation.  She has some mild nausea but no vomiting.  No change in bowel or bladder function.  Tolerant p.o. intake without significant difficulty.  No recent medication changes.  No recent travel or sick contacts, no history of PE or DVT.     Past Medical History:  Diagnosis Date  . Anxiety   . Arthritis    "might have a touch in my fingers" (08/02/2016)  . CAD (coronary artery disease)    a. s/p DES to RCA in 07/2016 with residual mid-LAD stenosis  . Cancer ([place])    skin cancer on finger  . Cervicalgia   . Chest pain, unspecified   . Depression   . GERD (gastroesophageal reflux disease)   . Headache    "bad ones after my stroke in 2016; only have them when I get bad news now" (08/02/2016)  . Heart murmur   . Heavy smoker (more than 20 cigarettes per day)    Scheduled for LDCT screening 02/11/15  . History of hiatal hernia   . History of kidney stones    "no cysto/OR" (12/10/2014)  . Hypercholesteremia   . Hypertension   .  Obesity   . Reflux esophagitis   . Stroke [facility])    "they said I had a silent stroke a few months back/MRI" (12/10/2014)  . TIA (transient ischemic attack) 11/28/2014   [name] 11/28/2014  . Type II diabetes mellitus ([place])     Past Surgical History:  Procedure Laterality Date  . ABDOMINAL AORTOGRAM N/A 08/02/2016   Procedure: Abdominal Aortogram;  Surgeon: Nelva Bush, MD;  Location: [place] [facility];  Service: Cardiovascular;  Laterality: N/A;  . CAROTID STENT    . CHOLECYSTECTOMY OPEN  1978  . COLONOSCOPY  2010   single diverticulum in sigmoid colon  . CORONARY ANGIOPLASTY WITH STENT PLACEMENT  08/02/2016   to RCA  . CORONARY STENT INTERVENTION N/A 08/02/2016   Procedure: Coronary Stent Intervention;  Surgeon: Nelva Bush, MD;  Location: [place] [facility];  Service: Cardiovascular;  Laterality: N/A;  RCA  . CYSTOSCOPY W/ RETROGRADES Bilateral 07/15/2021   Procedure: CYSTOSCOPY WITH RETROGRADE PYELOGRAM;  Surgeon: Primus Bravo., MD;  Location: [facility];  Service: Urology;  Laterality: Bilateral;  . CYSTOSCOPY WITH STENT PLACEMENT Left 07/15/2021   Procedure: CYSTOSCOPY WITH STENT PLACEMENT;  Surgeon: Primus Bravo., MD;  Location: [facility];  Service: Urology;  Laterality: Left;  . ENDARTERECTOMY Left 12/02/2014   Procedure: ENDARTERECTOMY CAROTID;  Surgeon: Rosetta Posner, MD;  Location: [place];  Service: Vascular;  Laterality: Left;  . LAPAROSCOPIC NEPHRECTOMY Left   . LAPAROSCOPIC NEPHRECTOMY Left 11/25/2021   Procedure: LEFT LAPAROSCOPIC RADICAL NEPHRECTOMY;  Surgeon: Ardis Hughs, MD;  Location: [facility];  Service: Urology;  Laterality: Left;  . LEFT HEART CATH AND CORONARY ANGIOGRAPHY N/A 08/02/2016   Procedure: Left Heart Cath and Coronary Angiography;  Surgeon: Nelva Bush, MD;  Location: [place] [facility];  Service: Cardiovascular;  Laterality: N/A;  . LOWER EXTREMITY ANGIOGRAM  08/02/2016  . LOWER EXTREMITY ANGIOGRAPHY N/A 08/02/2016    Procedure: Lower Extremity Angiography;  Surgeon: Nelva Bush, MD;  Location: [place] [facility];  Service: Cardiovascular;  Laterality: N/A;  . NEPHRECTOMY Left   . VAGINAL HYSTERECTOMY  1991   "partial"     The history is provided by the patient. No language interpreter was used.  Chest Pain Associated symptoms: fatigue and shortness of breath   Associated symptoms: no abdominal pain, no back pain, no cough, no dysphagia, no fever, no headache, no nausea and no palpitations        Home Medications Prior to Admission medications   Medication Sig Start Date End Date Taking? Authorizing Provider  acetaminophen (TYLENOL) 325 MG tablet Take 2 tablets (650 mg total) by mouth every 4 (four) hours as needed for headache or mild pain. Patient taking differently: Take 500 mg by mouth every 4 (four) hours as needed for headache or mild pain. 08/03/16   Erlene Quan, PA-C  albuterol (VENTOLIN HFA) 108 (90 Base) MCG/ACT inhaler Inhale 1-2 puffs into the lungs every 4 (four) hours as needed for shortness of breath or wheezing.    [provider]  ALPRAZolam Duanne Moron) 0.5 MG tablet Take 0.25 mg by mouth 2 (two) times daily. 03/20/19   [provider]  amLODipine (NORVASC) 5 MG tablet Take 1 tablet (5 mg total) by mouth daily. 07/17/21   Orson Eva, MD  aspirin EC 81 MG tablet Take 81 mg by mouth daily as needed (Chest pains). Swallow whole.    [provider]  benzonatate (TESSALON) 100 MG capsule Take 100 mg by mouth 4 (four) times daily as needed. 03/03/21   [provider]  Biotin 5000 MCG CAPS Take 5,000 mcg by mouth once a week.    [provider]  Blood Glucose Monitoring Suppl (ACCU-CHEK GUIDE) w/Device KIT 1 Piece by Does not apply route as directed. 08/23/19   Cassandria Anger, MD  Cholecalciferol (VITAMIN D3) 125 MCG (5000 UT) CAPS Take 5,000 Units by mouth once a week. Tuesday    [provider]  clopidogrel (PLAVIX) 75 MG tablet Take  1 tablet (75 mg total) by mouth daily. 12/03/14   Rai, Ripudeep K, MD  escitalopram (LEXAPRO) 20 MG tablet Take 20 mg by mouth daily. 01/01/21   [provider]  gabapentin (NEURONTIN) 300 MG capsule Take 300 mg by mouth at bedtime.    [provider]  glipiZIDE (GLUCOTROL XL) 2.5 MG 24 hr tablet TAKE 1 TABLET(2.5 MG) BY MOUTH DAILY WITH BREAKFAST 10/06/21   Brita Romp, NP  glucose blood (ACCU-CHEK GUIDE) test strip Use as instructed 08/23/19   Cassandria Anger, MD  metoprolol tartrate (LOPRESSOR) 25 MG tablet Take 1 tablet (25 mg total) by mouth 2 (two) times daily. 03/19/21   Strader, Fransisco Hertz, PA-C  nicotine (NICODERM CQ - DOSED IN MG/24 HOURS) 21 mg/24hr patch Place 1 patch (21 mg total) onto the skin daily. 11/26/21 11/26/22  Ardis Hughs, MD  nystatin cream (MYCOSTATIN) Apply 1 application  topically 2 (two) times daily as needed (Itching). 07/10/16   [provider]  ondansetron (ZOFRAN) 4 MG tablet TAKE 1 TABLET(4 MG) BY MOUTH TWICE DAILY AS NEEDED FOR NAUSEA OR VOMITING 10/29/21   Montez Morita, Quillian Quince, MD  oxyCODONE (ROXICODONE) 5 MG immediate release tablet Take 1 tablet (5 mg total) by mouth every 4 (four) hours as needed. 11/25/21   Ardis Hughs, MD  oxyCODONE-acetaminophen (PERCOCET/ROXICET) 5-325 MG tablet Take 1 tablet by mouth 3 (three) times daily as needed for moderate pain. 08/20/19   [provider]  OZEMPIC, 0.25 OR 0.5 MG/DOSE, 2 MG/1.5ML SOPN INJECT 0.5MG INTO THE SKIN ONCE WEEKLY 07/08/21   Brita Romp, NP  pantoprazole (PROTONIX) 40 MG tablet Take 1 tablet (40 mg total) by mouth daily before breakfast. 01/13/21   Carlan, Chelsea L, NP  rosuvastatin (CRESTOR) 20 MG tablet TAKE 1 TABLET(20 MG) BY MOUTH DAILY Patient taking differently: Take 20 mg by mouth at bedtime. 02/23/21   Arnoldo Lenis, MD      Allergies    Codeine, Latex, and Sulfa antibiotics    Review of Systems   Review of Systems   Constitutional:  Positive for fatigue. Negative for activity change and fever.  HENT:  Negative for facial swelling and trouble swallowing.   Eyes:  Negative for discharge and redness.  Respiratory:  Positive for shortness of breath. Negative for cough.   Cardiovascular:  Positive for chest pain. Negative for palpitations.  Gastrointestinal:  Negative for abdominal pain and nausea.  Genitourinary:  Negative for dysuria and flank pain.  Musculoskeletal:  Positive for arthralgias. Negative for back pain and gait problem.  Skin:  Negative for pallor and rash.  Neurological:  Negative for syncope and headaches.    Physical Exam Updated Vital Signs BP 134/79   Pulse (!) 45   Temp 97.7 F (36.5 C) (Oral)   Resp 15   Ht '5\' 6"'  (1.676 m)   Wt 68 kg   SpO2 93%   BMI 24.21 kg/m  Physical Exam Vitals and nursing note reviewed.  Constitutional:      General: She is not in acute distress.    Appearance: Normal appearance. She is well-developed.  HENT:     Head: Normocephalic and atraumatic.     Right Ear: External ear normal.     Left Ear: External ear normal.     Nose: Nose normal.     Mouth/Throat:     Mouth: Mucous membranes are moist.  Eyes:     General: No scleral icterus.       Right eye: No discharge.        Left eye: No discharge.  Cardiovascular:     Rate and Rhythm: Normal rate and regular rhythm.     Pulses: Normal pulses.     Heart sounds: Normal heart sounds.  Pulmonary:     Effort: Pulmonary effort is normal. Tachypnea present. No respiratory distress.     Breath sounds: Wheezing present.     Comments: Trace wheezing, left greater than right Abdominal:     General: Abdomen is flat.     Tenderness: There is abdominal tenderness.    Musculoskeletal:        General: Normal range of motion.     Cervical back: Normal range of motion.     Right lower leg: No edema.     Left lower leg: No edema.  Skin:    General: Skin is  warm and dry.     Capillary Refill:  Capillary refill takes less than 2 seconds.  Neurological:     Mental Status: She is alert.  Psychiatric:        Mood and Affect: Mood normal.        Behavior: Behavior normal.    ED Results / Procedures / Treatments   Labs (all labs ordered are listed, but only abnormal results are displayed) Labs Reviewed  BASIC METABOLIC PANEL - Abnormal; Notable for the following components:      Result Value   Potassium 5.2 (*)    CO2 19 (*)    Glucose, Bld 151 (*)    BUN 65 (*)    Creatinine, Ser 3.34 (*)    Calcium 8.7 (*)    GFR, Estimated 15 (*)    All other components within normal limits  CBC - Abnormal; Notable for the following components:   WBC 13.1 (*)    RBC 3.84 (*)    Hemoglobin 11.4 (*)    All other components within normal limits  RESP PANEL BY RT-PCR (FLU A&B, COVID) ARPGX2  BRAIN NATRIURETIC PEPTIDE  URINALYSIS, ROUTINE W REFLEX MICROSCOPIC  TROPONIN I (HIGH SENSITIVITY)  TROPONIN I (HIGH SENSITIVITY)    EKG None  Radiology DG Chest 2 View  Result Date: 11/28/2021 CLINICAL DATA:  Chest pain, shortness of breath. Recent left nephrectomy. EXAM: CHEST - 2 VIEW COMPARISON:  Chest radiograph dated February 27, 2021 FINDINGS: The heart size and mediastinal contours are within normal limits. Left basilar opacity likely representing atelectasis. No appreciable pleural effusion or pneumothorax. Mild thoracic spondylosis. No acute osseous abnormality. IMPRESSION: Left basilar opacity, likely representing atelectasis, no pleural effusion or pneumothorax. Electronically Signed   By: Keane Police D.O.   On: 11/28/2021 12:16    Procedures Procedures  {Document cardiac monitor, telemetry assessment procedure when appropriate:1}  Medications Ordered in ED Medications  sodium chloride 0.9 % bolus 1,000 mL (1,000 mLs Intravenous New Bag/Given 11/28/21 1409)    ED Course/ Medical Decision Making/ A&P                           Medical Decision Making Amount and/or Complexity of  Data Reviewed Labs: ordered. Radiology: ordered.  Risk Prescription drug management.    This patient presents to the ED with chief complaint(s) of *** with pertinent past medical history of *** which further complicates the presenting complaint. The complaint involves an extensive differential diagnosis and also carries with it a high risk of complications and morbidity.    The differential diagnosis includes but not limited to ***. Serious etiologies were considered.   The initial plan is to ***   Additional history obtained: Additional history obtained from {additional history:26846} Records reviewed {records:26847}  Independent labs interpretation:  The following labs were independently interpreted: ***  Independent visualization of imaging: - I independently visualized the following imaging with scope of interpretation limited to determining acute life threatening conditions related to emergency care: ***, which revealed ***  Cardiac monitoring was reviewed and interpreted by myself which shows ***  Treatment and Reassessment: ***  Consultation: - Consulted or discussed management/test interpretation w/ external professional: ***  Consideration for admission or further workup: Admission was considered ***  Social Determinants of health: Social History   Tobacco Use  . Smoking status: Every Day    Packs/day: 2.00    Years: 43.00    Total pack years: 86.00  Types: Cigarettes    Start date: 05/17/1974  . Smokeless tobacco: Never  Vaping Use  . Vaping Use: Never used  Substance Use Topics  . Alcohol use: No    Alcohol/week: 0.0 standard drinks of alcohol  . Drug use: No     {Document critical care time when appropriate:1} {Document review of labs and clinical decision tools ie heart score, Chads2Vasc2 etc:1}  {Document your independent review of radiology images, and any outside records:1} {Document your discussion with family members, caretakers, and with  consultants:1} {Document social determinants of health affecting pt's care:1} {Document your decision making why or why not admission, treatments were needed:1} Final Clinical Impression(s) / ED Diagnoses Final diagnoses:  None    Rx / DC Orders ED Discharge Orders     None

## 2021-11-28 NOTE — ED Triage Notes (Signed)
Pt with chest pain since Thursday all over and with SOB.  Pt states with recent left nephrectomy on 9/13.

## 2021-11-28 NOTE — Discharge Instructions (Addendum)
Drink plenty of fluids.  Follow-up with the urologist this week.  Call them Monday for an appointment

## 2021-11-28 NOTE — ED Notes (Signed)
Patient found to be 89% on RA. Patient placed on 2L/[place] at this time.

## 2021-11-30 ENCOUNTER — Ambulatory Visit: Payer: Self-pay | Admitting: *Deleted

## 2021-11-30 DIAGNOSIS — C642 Malignant neoplasm of left kidney, except renal pelvis: Secondary | ICD-10-CM | POA: Diagnosis not present

## 2021-11-30 DIAGNOSIS — C649 Malignant neoplasm of unspecified kidney, except renal pelvis: Secondary | ICD-10-CM | POA: Diagnosis not present

## 2021-11-30 LAB — URINE CULTURE

## 2021-11-30 LAB — SURGICAL PATHOLOGY

## 2021-11-30 NOTE — Patient Outreach (Signed)
  Care Coordination   Follow Up Visit Note   11/30/2021 Name: Grace Hess MRN: [id number] DOB: [birth year]  Grace Hess is a 66 y.o. year old female who sees Redmond School, MD for primary care. I spoke with  Grace Hess by phone today.  What matters to the patients health and wellness today? Report surgery went well, however was seen in the ED on 9/16 with complaints of chest discomfort.  This has improved, feels it may have been more muscular pain due to position during surgery.  She was however told she was dehydrated and had UTI.  Taking antibiotics and encouraged to increase fluid intake.  Denies any urgent concerns, encouraged to contact this care manager with questions.     Goals Addressed               This Visit's Progress     Have nephrectomy without complications (pt-stated)   Not on track     Care Coordination Interventions: Assessed patient understanding of cancer diagnosis and recommended treatment plan Reviewed upcoming provider appointments and treatment appointments Assessed available transportation to appointments and treatments. Has consistent/reliable transportation: Yes Assessed support system. Has consistent/reliable family or other support: Yes Advised to call urology office to report ED visit and UTI Reviewed upcoming follow up surgical appointment for 9/27 and oncology for 10/10 Reviewed discharge instructions with member         SDOH assessments and interventions completed:  No     Care Coordination Interventions Activated:  Yes  Care Coordination Interventions:  Yes, provided   Follow up plan: Follow up call scheduled for 10/4    Encounter Outcome:  Pt. Visit Completed   Valente David, RN, MSN, [place] [facility] Management Care Management Coordinator [phone]

## 2021-11-30 NOTE — Patient Instructions (Signed)
Visit Information  Thank you for taking time to visit with me today. Please don't hesitate to contact me if I can be of assistance to you before our next scheduled telephone appointment.  Following are the goals we discussed today:  Call [facility] to report ED visit and diagnosis of UTI.  Our next appointment is by telephone on 10/4  Please call the care guide team at [phone] if you need to cancel or reschedule your appointment.   Please call the Suicide and Crisis Lifeline: 988 call the [place] National Suicide Prevention Lifeline: 646-839-1928 or TTY: 9806583049 TTY (956) 726-6111) to talk to a trained counselor call 1-800-273-TALK (toll free, 24 hour hotline) call the [facility]: [phone] if you are experiencing a [place] or [place] or need someone to talk to.  Patient verbalizes understanding of instructions and care plan provided today and agrees to view in [place]. Active MyChart status and patient understanding of how to access instructions and care plan via MyChart confirmed with patient.     The patient has been provided with contact information for the care management team and has been advised to call with any health related questions or concerns.   Valente David, RN, MSN, [facility] Care Management Care Management Coordinator [phone]

## 2021-12-08 DIAGNOSIS — C642 Malignant neoplasm of left kidney, except renal pelvis: Secondary | ICD-10-CM | POA: Diagnosis not present

## 2021-12-12 DIAGNOSIS — E1122 Type 2 diabetes mellitus with diabetic chronic kidney disease: Secondary | ICD-10-CM | POA: Diagnosis not present

## 2021-12-12 DIAGNOSIS — N183 Chronic kidney disease, stage 3 unspecified: Secondary | ICD-10-CM | POA: Diagnosis not present

## 2021-12-12 DIAGNOSIS — I10 Essential (primary) hypertension: Secondary | ICD-10-CM | POA: Diagnosis not present

## 2021-12-16 ENCOUNTER — Ambulatory Visit: Payer: Self-pay | Admitting: *Deleted

## 2021-12-16 NOTE — Patient Outreach (Signed)
  Care Coordination   12/16/2021 Name: Grace Hess MRN: [id number] DOB: May 24, 1955   Care Coordination Outreach Attempts:  An unsuccessful telephone outreach was attempted for a scheduled appointment today.  Follow Up Plan:  Additional outreach attempts will be made to offer the patient care coordination information and services.   Encounter Outcome:  No Answer. Forwarding to scheduling Care Guide to outreach for rescheduling.  Care Coordination Interventions Activated:  No   Care Coordination Interventions:  No, not indicated    Chong Sicilian, BSN, RN-BC RN Care Coordinator [place]: [phone] Main #: [phone]

## 2021-12-22 ENCOUNTER — Inpatient Hospital Stay: Payer: HMO | Attending: Hematology | Admitting: Hematology

## 2021-12-22 VITALS — BP 94/53 | HR 60 | Temp 97.7°F | Resp 16 | Wt 143.7 lb

## 2021-12-22 DIAGNOSIS — C642 Malignant neoplasm of left kidney, except renal pelvis: Secondary | ICD-10-CM | POA: Diagnosis not present

## 2021-12-22 DIAGNOSIS — Z905 Acquired absence of kidney: Secondary | ICD-10-CM | POA: Diagnosis not present

## 2021-12-22 DIAGNOSIS — F17210 Nicotine dependence, cigarettes, uncomplicated: Secondary | ICD-10-CM | POA: Insufficient documentation

## 2021-12-22 DIAGNOSIS — D649 Anemia, unspecified: Secondary | ICD-10-CM | POA: Diagnosis not present

## 2021-12-22 NOTE — Patient Instructions (Addendum)
Grace Hess  Discharge Instructions  You were seen and examined today by Dr. Delton Coombes.  Dr. Delton Coombes discussed your most recent lab work and CT scan which revealed that it showed that your cancer is a stage one so with stage one we just keep a watch with scans.  We will keep a check on your iron as people who have one kidney require more iron.  Follow-up as scheduled in 3 months with labs.    Thank you for choosing [facility] to provide your oncology and hematology care.   To afford each patient quality time with our provider, please arrive at least 15 minutes before your scheduled appointment time. You may need to reschedule your appointment if you arrive late (10 or more minutes). Arriving late affects you and other patients whose appointments are after yours.  Also, if you miss three or more appointments without notifying the office, you may be dismissed from the clinic at the provider's discretion.    Again, thank you for choosing [facility].  Our hope is that these requests will decrease the amount of time that you wait before being seen by our physicians.   If you have a lab appointment with the [facility] please come in thru the Main Entrance and check in at the main information desk.           _____________________________________________________________  Should you have questions after your visit to [facility], please contact our office at [phone] and follow the prompts.  Our office hours are 8:00 a.m. to 4:30 p.m. Monday - Thursday and 8:00 a.m. to 2:30 p.m. Friday.  Please note that voicemails left after 4:00 p.m. may not be returned until the following business day.  We are closed weekends and all major holidays.  You do have access to a nurse 24-7, just call the main number to the clinic [phone] and do not press any options, hold on the line and a nurse will answer the phone.     For prescription refill requests, have your pharmacy contact our office and allow 72 hours.    Masks are optional in the cancer centers. If you would like for your care team to wear a mask while they are taking care of you, please let them know. You may have one support person who is at least 66 years old accompany you for your appointments.

## 2021-12-22 NOTE — Progress Notes (Signed)
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  NGS Results: not done  CURRENT THERAPY: Surveillance.  CANCER STAGING:  Cancer Staging  Renal cell cancer, left [facility]) Staging form: Kidney, AJCC 8th Edition - Clinical stage from 10/01/2021: Stage I (cT1a, cN0, cM0) - Signed by Derek Jack, MD on 12/22/2021   INTERVAL HISTORY:  Grace Hess, a 66 y.o. female, seen for follow-up of left kidney clear-cell renal cell carcinoma.  Grace Hess is recovering well from surgery.  Grace Hess has some postsurgical pain which is also improving.  REVIEW OF SYSTEMS:  Review of Systems  Respiratory:  Positive for cough and shortness of breath.   Gastrointestinal:  Positive for nausea.  All other systems reviewed and are negative.   PAST MEDICAL/SURGICAL HISTORY:  Past Medical History:  Diagnosis Date   Anxiety    Arthritis    "might have a touch in my fingers" (08/02/2016)   CAD (coronary artery disease)    a. s/p DES to RCA in 07/2016 with residual mid-LAD stenosis   Cancer ([place])    skin cancer on finger   Cervicalgia    Chest pain, unspecified    Depression    GERD (gastroesophageal reflux disease)    Headache    "bad ones after my stroke in 2016; only have them when I get bad news now" (08/02/2016)   Heart murmur    Heavy smoker (more than 20 cigarettes per day)    Scheduled for LDCT screening 02/11/15   History of hiatal hernia    History of kidney stones    "no cysto/OR" (12/10/2014)   Hypercholesteremia    Hypertension    Obesity    Reflux esophagitis    Stroke [facility])    "they said I had a silent stroke a few months back/MRI" (12/10/2014)   TIA (transient ischemic attack) 11/28/2014   [name]  11/28/2014   Type II diabetes mellitus ([place])    Past Surgical History:  Procedure Laterality Date   ABDOMINAL AORTOGRAM N/A 08/02/2016   Procedure: Abdominal Aortogram;  Surgeon: Nelva Bush, MD;  Location: [facility];  Service: Cardiovascular;  Laterality: N/A;   CAROTID STENT     CHOLECYSTECTOMY OPEN  1978   COLONOSCOPY  2010   single diverticulum in sigmoid colon   CORONARY ANGIOPLASTY WITH STENT PLACEMENT  08/02/2016   to RCA   CORONARY STENT INTERVENTION N/A 08/02/2016   Procedure: Coronary Stent Intervention;  Surgeon: Nelva Bush, MD;  Location: [place] [facility];  Service: Cardiovascular;  Laterality: N/A;  RCA   CYSTOSCOPY W/ RETROGRADES Bilateral 07/15/2021   Procedure: CYSTOSCOPY WITH RETROGRADE PYELOGRAM;  Surgeon: Primus Bravo., MD;  Location: [facility];  Service: Urology;  Laterality: Bilateral;   CYSTOSCOPY WITH STENT PLACEMENT Left 07/15/2021   Procedure: CYSTOSCOPY WITH STENT PLACEMENT;  Surgeon: Primus Bravo., MD;  Location: [facility];  Service: Urology;  Laterality: Left;   ENDARTERECTOMY Left 12/02/2014   Procedure: ENDARTERECTOMY CAROTID;  Surgeon: Rosetta Posner, MD;  Location: [facility];  Service: Vascular;  Laterality: Left;   LAPAROSCOPIC NEPHRECTOMY Left    LAPAROSCOPIC NEPHRECTOMY Left 11/25/2021   Procedure: LEFT LAPAROSCOPIC RADICAL NEPHRECTOMY;  Surgeon: Ardis Hughs, MD;  Location: [facility];  Service:  Urology;  Laterality: Left;   LEFT HEART CATH AND CORONARY ANGIOGRAPHY N/A 08/02/2016   Procedure: Left Heart Cath and Coronary Angiography;  Surgeon: Nelva Bush, MD;  Location: [facility];  Service: Cardiovascular;  Laterality: N/A;   LOWER EXTREMITY ANGIOGRAM  08/02/2016   LOWER EXTREMITY ANGIOGRAPHY N/A 08/02/2016   Procedure: Lower Extremity Angiography;  Surgeon: Nelva Bush, MD;  Location: [facility];  Service: Cardiovascular;  Laterality: N/A;   NEPHRECTOMY Left    VAGINAL HYSTERECTOMY  1991    "partial"    SOCIAL HISTORY:  Social History   Socioeconomic History   Marital status: Married    Spouse name: Not on file   Number of children: 2   Years of education: 12   Highest education level: Not on file  Occupational History   Not on file  Tobacco Use   Smoking status: Every Day    Packs/day: 2.00    Years: 43.00    Total pack years: 86.00    Types: Cigarettes    Start date: 05/17/1974   Smokeless tobacco: Never  Vaping Use   Vaping Use: Never used  Substance and Sexual Activity   Alcohol use: No    Alcohol/week: 0.0 standard drinks of alcohol   Drug use: No   Sexual activity: Not Currently  Other Topics Concern   Not on file  Social History Narrative   Patient does not drink caffeine.   Patient is right handed.    Social Determinants of Health   Financial Resource Strain: Not on file  Food Insecurity: No Food Insecurity (11/25/2021)   Hunger Vital Sign    Worried About Running Out of Food in the Last Year: Never true    Ran Out of Food in the Last Year: Never true  Transportation Needs: No Transportation Needs (11/25/2021)   PRAPARE - Hydrologist (Medical): No    Lack of Transportation (Non-Medical): No  Physical Activity: Not on file  Stress: Not on file  Social Connections: Not on file  Intimate Partner Violence: Not At Risk (11/25/2021)   Humiliation, Afraid, Rape, and Kick questionnaire    Fear of Current or Ex-Partner: No    Emotionally Abused: No    Physically Abused: No    Sexually Abused: No    FAMILY HISTORY:  Family History  Problem Relation Age of Onset   Congestive Heart Failure Mother    COPD Father    Cancer Brother    Cancer Brother     CURRENT MEDICATIONS:  Current Outpatient Medications  Medication Sig Dispense Refill   acetaminophen (TYLENOL) 325 MG tablet Take 2 tablets (650 mg total) by mouth every 4 (four) hours as needed for headache or mild pain. (Patient taking differently: Take 500 mg by  mouth every 4 (four) hours as needed for headache or mild pain.)     albuterol (VENTOLIN HFA) 108 (90 Base) MCG/ACT inhaler Inhale 1-2 puffs into the lungs every 4 (four) hours as needed for shortness of breath or wheezing.     ALPRAZolam (XANAX) 0.5 MG tablet Take 0.25 mg by mouth 2 (two) times daily.     amLODipine (NORVASC) 5 MG tablet Take 1 tablet (5 mg total) by mouth daily. 30 tablet 1   aspirin EC 81 MG tablet Take 81 mg by mouth daily as needed (Chest pains). Swallow whole.     benzonatate (TESSALON) 100 MG capsule Take 100 mg by mouth 4 (four) times daily as needed for  cough.     Biotin 5000 MCG CAPS Take 5,000 mcg by mouth daily.     Blood Glucose Monitoring Suppl (ACCU-CHEK GUIDE) w/Device KIT 1 Piece by Does not apply route as directed. 1 kit 0   cephALEXin (KEFLEX) 500 MG capsule Take 1 capsule (500 mg total) by mouth 4 (four) times daily. 28 capsule 0   Cholecalciferol (VITAMIN D3) 125 MCG (5000 UT) CAPS Take 5,000 Units by mouth once a week. Tuesday     clopidogrel (PLAVIX) 75 MG tablet Take 1 tablet (75 mg total) by mouth daily. 30 tablet 3   escitalopram (LEXAPRO) 20 MG tablet Take 20 mg by mouth daily.     gabapentin (NEURONTIN) 300 MG capsule Take 300 mg by mouth at bedtime.     glipiZIDE (GLUCOTROL XL) 2.5 MG 24 hr tablet TAKE 1 TABLET(2.5 MG) BY MOUTH DAILY WITH BREAKFAST (Patient taking differently: Take 2.5 mg by mouth daily with breakfast.) 30 tablet 2   glucose blood (ACCU-CHEK GUIDE) test strip Use as instructed 150 each 2   metoprolol tartrate (LOPRESSOR) 25 MG tablet Take 1 tablet (25 mg total) by mouth 2 (two) times daily. 180 tablet 3   nicotine (NICODERM CQ - DOSED IN MG/24 HOURS) 21 mg/24hr patch Place 1 patch (21 mg total) onto the skin daily. 30 patch 1   nystatin cream (MYCOSTATIN) Apply 1 application  topically 2 (two) times daily as needed (Itching).  5   ondansetron (ZOFRAN) 4 MG tablet TAKE 1 TABLET(4 MG) BY MOUTH TWICE DAILY AS NEEDED FOR NAUSEA OR VOMITING  (Patient taking differently: Take 4 mg by mouth 2 (two) times daily as needed for nausea or vomiting. TAKE 1 TABLET(4 MG) BY MOUTH TWICE DAILY AS NEEDED FOR NAUSEA OR VOMITING) 20 tablet 0   oxyCODONE-acetaminophen (PERCOCET/ROXICET) 5-325 MG tablet Take 1 tablet by mouth 3 (three) times daily as needed.     pantoprazole (PROTONIX) 40 MG tablet Take 1 tablet (40 mg total) by mouth daily before breakfast. 90 tablet 3   promethazine (PHENERGAN) 25 MG tablet Take 25 mg by mouth 4 (four) times daily as needed.     rosuvastatin (CRESTOR) 20 MG tablet TAKE 1 TABLET(20 MG) BY MOUTH DAILY (Patient taking differently: Take 20 mg by mouth at bedtime.) 90 tablet 3   No current facility-administered medications for this visit.    ALLERGIES:  Allergies  Allergen Reactions   Codeine Shortness Of Breath   Latex Itching   Sulfa Antibiotics Other (See Comments)    Reaction:  Unknown     PHYSICAL EXAM:  Performance status (ECOG): 1 - Symptomatic but completely ambulatory  Vitals:   12/22/21 1535  BP: (!) 94/53  Pulse: 60  Resp: 16  Temp: 97.7 F (36.5 C)  SpO2: 100%   Wt Readings from Last 3 Encounters:  12/22/21 143 lb 11.8 oz (65.2 kg)  11/28/21 150 lb (68 kg)  11/25/21 149 lb 14.6 oz (68 kg)   Physical Exam Vitals reviewed.  Constitutional:      Appearance: Normal appearance.  Cardiovascular:     Rate and Rhythm: Normal rate and regular rhythm.     Pulses: Normal pulses.     Heart sounds: Normal heart sounds.  Pulmonary:     Effort: Pulmonary effort is normal.     Breath sounds: Normal breath sounds.  Neurological:     General: No focal deficit present.     Mental Status: Grace Hess is alert and oriented to person, place, and time.  Psychiatric:  Mood and Affect: Mood normal.        Behavior: Behavior normal.   Left flank area incisions are well-healed.  LABORATORY DATA:  I have reviewed the labs as listed.     Latest Ref Rng & Units 11/28/2021   11:50 AM 11/26/2021    5:22  AM 11/19/2021    8:30 AM  CBC  WBC 4.0 - 10.5 K/uL 13.1   9.8   Hemoglobin 12.0 - 15.0 g/dL 11.4  10.7  11.0   Hematocrit 36.0 - 46.0 % 36.2  33.3  34.5   Platelets 150 - 400 K/uL 223   201       Latest Ref Rng & Units 11/28/2021   11:50 AM 11/26/2021    5:22 AM 11/19/2021    8:30 AM  CMP  Glucose 70 - 99 mg/dL 151  257  224   BUN 8 - 23 mg/dL 65  49  49   Creatinine 0.44 - 1.00 mg/dL 3.34  2.58  1.80   Sodium 135 - 145 mmol/L 136  132  141   Potassium 3.5 - 5.1 mmol/L 5.2  5.4  4.6   Chloride 98 - 111 mmol/L 108  105  113   CO2 22 - 32 mmol/L '19  20  22   ' Calcium 8.9 - 10.3 mg/dL 8.7  8.6  9.1   Total Protein 6.5 - 8.1 g/dL   6.8   Total Bilirubin 0.3 - 1.2 mg/dL   0.5   Alkaline Phos 38 - 126 U/L   69   AST 15 - 41 U/L   14   ALT 0 - 44 U/L   12     DIAGNOSTIC IMAGING:  I have independently reviewed the scans and discussed with the patient. CT Renal Stone Study  Result Date: 11/28/2021 CLINICAL DATA:  Flank and chest pain for 2 days, shortness of breath, recent left nephrectomy 11/25/2021 for renal cell carcinoma EXAM: CT ABDOMEN AND PELVIS WITHOUT CONTRAST TECHNIQUE: Multidetector CT imaging of the abdomen and pelvis was performed following the standard protocol without IV contrast. Unenhanced CT was performed per clinician order. Lack of IV contrast limits sensitivity and specificity, especially for evaluation of abdominal/pelvic solid viscera. RADIATION DOSE REDUCTION: This exam was performed according to the departmental dose-optimization program which includes automated exposure control, adjustment of the mA and/or kV according to patient size and/or use of iterative reconstruction technique. COMPARISON:  07/13/2021, 10/08/2021 FINDINGS: Lower chest: No acute pleural or parenchymal lung disease. Hepatobiliary: Cholecystectomy. Unremarkable unenhanced appearance of the liver. No biliary duct dilation. Pancreas: Unremarkable unenhanced appearance. Spleen: Unremarkable unenhanced  appearance. Adrenals/Urinary Tract: Interval left nephrectomy, with no evidence of postoperative complication within the nephrectomy bed. Minimal fluid within the left para-aortic retroperitoneal tissues likely postoperative. Retained stent within the remaining portions of the left ureter, extending from the mid left ureter through the left UVJ. The stent extends into the bladder lumen. Stable lobular contour the right kidney likely developmental. Cortical calcifications and nonobstructing right renal calculi are unchanged since prior exam. No right-sided obstructive uropathy. Bladder is moderately distended, with a small amount of gas within the bladder lumen likely from recent surgery. The right adrenal gland is unremarkable. The left adrenal gland is not well visualized. Stomach/Bowel: No bowel obstruction or ileus. Normal appendix right lower quadrant. No bowel wall thickening or inflammatory change. Vascular/Lymphatic: Aortic atherosclerosis. No enlarged abdominal or pelvic lymph nodes. Reproductive: Status post hysterectomy. No adnexal masses. Other: There is extensive subcutaneous gas within the  left lower chest wall and left lateral abdominal wall consistent with recent left nephrectomy. Punctate foci of pneumoperitoneum are identified, consistent with recent surgical intervention. No free intraperitoneal fluid. There is a focal fat containing ventral hernia within the left lateral abdominal wall, reference image 48, likely at the site of recent surgery for nephrectomy. No bowel herniation. Musculoskeletal: No acute or destructive bony lesions. Reconstructed images demonstrate no additional findings. IMPRESSION: 1. Postsurgical changes from left nephrectomy. No complication within the left nephrectomy bed. 2. There is a retained stent within the remaining portion of the left ureter. The stent extends from the transsection site of the mid left ureter into the bladder lumen. 3. Minimal pneumoperitoneum as well  as extensive subcutaneous abdominal wall gas, consistent with recent surgical intervention. 4. Fat containing left lateral ventral hernia likely at the site of surgical approach. No bowel herniation. 5.  Aortic Atherosclerosis (ICD10-I70.0). Electronically Signed   By: Randa Ngo M.D.   On: 11/28/2021 15:42   DG Chest 2 View  Result Date: 11/28/2021 CLINICAL DATA:  Chest pain, shortness of breath. Recent left nephrectomy. EXAM: CHEST - 2 VIEW COMPARISON:  Chest radiograph dated February 27, 2021 FINDINGS: The heart size and mediastinal contours are within normal limits. Left basilar opacity likely representing atelectasis. No appreciable pleural effusion or pneumothorax. Mild thoracic spondylosis. No acute osseous abnormality. IMPRESSION: Left basilar opacity, likely representing atelectasis, no pleural effusion or pneumothorax. Electronically Signed   By: Keane Police D.O.   On: 11/28/2021 12:16     ASSESSMENT:  Left kidney clear cell renal cell carcinoma (T1AN0): - CT renal stone study (07/13/2021): Compensated hypertrophy of the left kidney, 5.4 x 3.7 x 4.4 cm lobulated mildly hyperdense soft tissue mass within the anteromedial aspect of the mid to lower left kidney.  Moderate to marked severity left-sided hydronephrosis.  Numerous bilateral nonobstructing renal calculi. - MRI abdomen (08/14/2021): Central lower pole left kidney 4 x 3.7 x 5.3 cm enhancing mass, extension into the proximal left ureter.  No renal vein involvement or evidence of abdominal metastatic disease.  Multiple small abdominal retroperitoneal lymph nodes, nonpathologic by size criteria.  Right abdominal wall subcutaneous nodule nonspecific but new since 07/13/2021. - Left kidney biopsy (09/10/2021): Clear-cell renal cell carcinoma, nuclear grade 2.  No tumor necrosis. - Left nephrectomy on 11/25/2021: Pathology 3.2 cm clear-cell renal cell carcinoma, grade 4, negative sarcoid features, focal rhabdoid features, tumor necrosis negative.   PT1APN0.    Social/family history: - Grace Hess lives at home with Grace Hess husband.  Grace Hess retired after working in [employer]  Grace Hess is current active smoker, 2 packs/day for 48 years. - Brother had throat cancer and was smoker.  Maternal uncle died of cancer.  Paternal uncle also had cancer.  Maternal grandmother had lung cancer.   PLAN:  Stage I (T1 aN0 M0) left kidney clear-cell renal cell carcinoma, grade 4: - I have reviewed pathology report with the patient in detail.  No indication for adjuvant treatment. - We have reviewed surveillance plan with abdominal imaging within 6 months after surgery, and then annually.  Chest imaging also recommended annually. - Grace Hess reports that Grace Hess has an appointment with Dr. Louis Meckel in December.  I will see Grace Hess back in 3 months for follow-up.  I have counseled Grace Hess to avoid nephrotoxins.  We will check ferritin and iron panel with routine labs at next visit as Grace Hess had some anemia on the most recent labs.   Orders placed this encounter:  Orders Placed This Encounter  Procedures   CBC with Differential/Platelet   Comprehensive metabolic panel   Ferritin   Iron and TIBC   Lactate dehydrogenase      Derek Jack, MD [facility] [phone]

## 2021-12-31 ENCOUNTER — Ambulatory Visit: Payer: Self-pay | Admitting: *Deleted

## 2021-12-31 NOTE — Patient Outreach (Signed)
  Care Coordination   12/31/2021 Name: [name] MRN: [id number] DOB: [birth year]   Care Coordination Outreach Attempts:  An unsuccessful telephone outreach was attempted for a scheduled appointment today.  Follow Up Plan:  Additional outreach attempts will be made to offer the patient care coordination information and services.   Encounter Outcome:  No Answer  Care Coordination Interventions Activated:  No   Care Coordination Interventions:  No, not indicated    Valente David, RN, MSN, [facility] Care Management Care Management Coordinator [phone]

## 2022-01-01 ENCOUNTER — Ambulatory Visit (INDEPENDENT_AMBULATORY_CARE_PROVIDER_SITE_OTHER): Payer: HMO | Admitting: Nurse Practitioner

## 2022-01-01 ENCOUNTER — Encounter: Payer: Self-pay | Admitting: Nurse Practitioner

## 2022-01-01 VITALS — BP 93/53 | HR 58 | Ht 66.0 in | Wt 143.8 lb

## 2022-01-01 DIAGNOSIS — E1159 Type 2 diabetes mellitus with other circulatory complications: Secondary | ICD-10-CM | POA: Diagnosis not present

## 2022-01-01 DIAGNOSIS — E559 Vitamin D deficiency, unspecified: Secondary | ICD-10-CM | POA: Diagnosis not present

## 2022-01-01 DIAGNOSIS — I10 Essential (primary) hypertension: Secondary | ICD-10-CM | POA: Diagnosis not present

## 2022-01-01 DIAGNOSIS — E782 Mixed hyperlipidemia: Secondary | ICD-10-CM | POA: Diagnosis not present

## 2022-01-01 NOTE — Progress Notes (Signed)
01/01/2022, 10:46 AM                                Endocrinology follow-up note   Subjective:    Patient ID: Grace Hess, female    DOB: 04-20-1955.  TAHISHA Hess is being seen in follow-up for management of currently uncontrolled symptomatic diabetes requested by  Redmond School, MD.   Past Medical History:  Diagnosis Date   Anxiety    Arthritis    "might have a touch in my fingers" (08/02/2016)   CAD (coronary artery disease)    a. s/p DES to RCA in 07/2016 with residual mid-LAD stenosis   Cancer ([place])    skin cancer on finger   Cervicalgia    Chest pain, unspecified    Depression    GERD (gastroesophageal reflux disease)    Headache    "bad ones after my stroke in 2016; only have them when I get bad news now" (08/02/2016)   Heart murmur    Heavy smoker (more than 20 cigarettes per day)    Scheduled for LDCT screening 02/11/15   History of hiatal hernia    History of kidney stones    "no cysto/OR" (12/10/2014)   Hypercholesteremia    Hypertension    Obesity    Reflux esophagitis    Stroke [facility])    "they said I had a silent stroke a few months back/MRI" (12/10/2014)   TIA (transient ischemic attack) 11/28/2014   [name] 11/28/2014   Type II diabetes mellitus ([place])     Past Surgical History:  Procedure Laterality Date   ABDOMINAL AORTOGRAM N/A 08/02/2016   Procedure: Abdominal Aortogram;  Surgeon: Nelva Bush, MD;  Location: [place] [facility];  Service: Cardiovascular;  Laterality: N/A;   CAROTID STENT     CHOLECYSTECTOMY OPEN  1978   COLONOSCOPY  2010   single diverticulum in sigmoid colon   CORONARY ANGIOPLASTY WITH STENT PLACEMENT  08/02/2016   to RCA   CORONARY STENT INTERVENTION N/A 08/02/2016   Procedure: Coronary Stent Intervention;  Surgeon: Nelva Bush, MD;  Location: [facility] [facility];  Service: Cardiovascular;  Laterality: N/A;  RCA   CYSTOSCOPY W/  RETROGRADES Bilateral 07/15/2021   Procedure: CYSTOSCOPY WITH RETROGRADE PYELOGRAM;  Surgeon: Primus Bravo., MD;  Location: [facility];  Service: Urology;  Laterality: Bilateral;   CYSTOSCOPY WITH STENT PLACEMENT Left 07/15/2021   Procedure: CYSTOSCOPY WITH STENT PLACEMENT;  Surgeon: Primus Bravo., MD;  Location: [facility];  Service: Urology;  Laterality: Left;   ENDARTERECTOMY Left 12/02/2014   Procedure: ENDARTERECTOMY CAROTID;  Surgeon: Rosetta Posner, MD;  Location: [facility];  Service: Vascular;  Laterality: Left;   LAPAROSCOPIC NEPHRECTOMY Left    LAPAROSCOPIC NEPHRECTOMY Left 11/25/2021   Procedure: LEFT LAPAROSCOPIC RADICAL NEPHRECTOMY;  Surgeon: Ardis Hughs, MD;  Location: [facility];  Service: Urology;  Laterality: Left;   LEFT HEART CATH AND CORONARY ANGIOGRAPHY N/A 08/02/2016   Procedure: Left Heart Cath and Coronary Angiography;  Surgeon: Nelva Bush, MD;  Location: [facility];  Service: Cardiovascular;  Laterality: N/A;   LOWER EXTREMITY ANGIOGRAM  08/02/2016   LOWER EXTREMITY ANGIOGRAPHY N/A 08/02/2016   Procedure: Lower Extremity Angiography;  Surgeon: Nelva Bush, MD;  Location: [facility];  Service: Cardiovascular;  Laterality: N/A;   NEPHRECTOMY Left    VAGINAL HYSTERECTOMY  1991   "partial"    Social History   Socioeconomic History   Marital status: Married    Spouse name: Not on file   Number of children: 2   Years of education: 12   Highest education level: Not on file  Occupational History   Not on file  Tobacco Use   Smoking status: Every Day    Packs/day: 2.00    Years: 43.00    Total pack years: 86.00    Types: Cigarettes    Start date: 05/17/1974   Smokeless tobacco: Never  Vaping Use   Vaping Use: Never used  Substance and Sexual Activity   Alcohol use: No    Alcohol/week: 0.0 standard drinks of alcohol   Drug use: No   Sexual activity: Not Currently  Other Topics Concern   Not on file  Social History Narrative    Patient does not drink caffeine.   Patient is right handed.    Social Determinants of Health   Financial Resource Strain: Not on file  Food Insecurity: No Food Insecurity (11/25/2021)   Hunger Vital Sign    Worried About Running Out of Food in the Last Year: Never true    Ran Out of Food in the Last Year: Never true  Transportation Needs: No Transportation Needs (11/25/2021)   PRAPARE - Hydrologist (Medical): No    Lack of Transportation (Non-Medical): No  Physical Activity: Not on file  Stress: Not on file  Social Connections: Not on file    Family History  Problem Relation Age of Onset   Congestive Heart Failure Mother    COPD Father    Cancer Brother    Cancer Brother     Outpatient Encounter Medications as of 01/01/2022  Medication Sig   acetaminophen (TYLENOL) 325 MG tablet Take 2 tablets (650 mg total) by mouth every 4 (four) hours as needed for headache or mild pain. (Patient taking differently: Take 500 mg by mouth every 4 (four) hours as needed for headache or mild pain.)   albuterol (VENTOLIN HFA) 108 (90 Base) MCG/ACT inhaler Inhale 1-2 puffs into the lungs every 4 (four) hours as needed for shortness of breath or wheezing.   ALPRAZolam (XANAX) 0.5 MG tablet Take 0.25 mg by mouth 2 (two) times daily.   amLODipine (NORVASC) 5 MG tablet Take 1 tablet (5 mg total) by mouth daily.   aspirin EC 81 MG tablet Take 81 mg by mouth daily as needed (Chest pains). Swallow whole.   benzonatate (TESSALON) 100 MG capsule Take 100 mg by mouth 4 (four) times daily as needed for cough.   Biotin 5000 MCG CAPS Take 5,000 mcg by mouth daily.   Blood Glucose Monitoring Suppl (ACCU-CHEK GUIDE) w/Device KIT 1 Piece by Does not apply route as directed.   cephALEXin (KEFLEX) 500 MG capsule Take 1 capsule (500 mg total) by mouth 4 (four) times daily.   Cholecalciferol (VITAMIN D3) 125 MCG (5000 UT) CAPS Take 5,000 Units by mouth once a week. Tuesday   clopidogrel  (PLAVIX) 75 MG tablet Take 1 tablet (75 mg total) by mouth daily.   escitalopram (LEXAPRO) 20 MG tablet Take 20  mg by mouth daily.   gabapentin (NEURONTIN) 300 MG capsule Take 300 mg by mouth at bedtime.   glucose blood (ACCU-CHEK GUIDE) test strip Use as instructed   metoprolol tartrate (LOPRESSOR) 25 MG tablet Take 1 tablet (25 mg total) by mouth 2 (two) times daily.   nicotine (NICODERM CQ - DOSED IN MG/24 HOURS) 21 mg/24hr patch Place 1 patch (21 mg total) onto the skin daily.   nystatin cream (MYCOSTATIN) Apply 1 application  topically 2 (two) times daily as needed (Itching).   ondansetron (ZOFRAN) 4 MG tablet TAKE 1 TABLET(4 MG) BY MOUTH TWICE DAILY AS NEEDED FOR NAUSEA OR VOMITING (Patient taking differently: Take 4 mg by mouth 2 (two) times daily as needed for nausea or vomiting. TAKE 1 TABLET(4 MG) BY MOUTH TWICE DAILY AS NEEDED FOR NAUSEA OR VOMITING)   oxyCODONE-acetaminophen (PERCOCET/ROXICET) 5-325 MG tablet Take 1 tablet by mouth 3 (three) times daily as needed.   pantoprazole (PROTONIX) 40 MG tablet Take 1 tablet (40 mg total) by mouth daily before breakfast.   promethazine (PHENERGAN) 25 MG tablet Take 25 mg by mouth 4 (four) times daily as needed.   rosuvastatin (CRESTOR) 20 MG tablet TAKE 1 TABLET(20 MG) BY MOUTH DAILY (Patient taking differently: Take 20 mg by mouth at bedtime.)   [DISCONTINUED] glipiZIDE (GLUCOTROL XL) 2.5 MG 24 hr tablet TAKE 1 TABLET(2.5 MG) BY MOUTH DAILY WITH BREAKFAST (Patient taking differently: Take 2.5 mg by mouth daily with breakfast.)   No facility-administered encounter medications on file as of 01/01/2022.    ALLERGIES: Allergies  Allergen Reactions   Codeine Shortness Of Breath   Latex Itching   Sulfa Antibiotics Other (See Comments)    Reaction:  Unknown     VACCINATION STATUS: Immunization History  Administered Date(s) Administered   Influenza,inj,Quad PF,6+ Mos 11/29/2014   Pneumococcal Polysaccharide-23 11/29/2014    Diabetes She  presents for her follow-up diabetic visit. She has type 2 diabetes mellitus. Onset time: She was diagnosed at approximate age of 13 years. Her disease course has been improving. Hypoglycemia symptoms include sleepiness, sweats and tremors. Pertinent negatives for hypoglycemia include no confusion, headaches, pallor or seizures. Associated symptoms include fatigue, foot paresthesias and weight loss. Pertinent negatives for diabetes include no chest pain, no polydipsia, no polyphagia and no polyuria. There are no hypoglycemic complications. Symptoms are stable. Diabetic complications include a CVA, heart disease and nephropathy. Risk factors for coronary artery disease include dyslipidemia, diabetes mellitus, family history, hypertension, tobacco exposure, sedentary lifestyle and post-menopausal. Current diabetic treatment includes oral agent (monotherapy). She is compliant with treatment most of the time. Her weight is fluctuating minimally. She is following a generally healthy diet. When asked about meal planning, she reported none. She has not had a previous visit with a dietitian. She never participates in exercise. Her home blood glucose trend is decreasing steadily. Her breakfast blood glucose range is generally 90-110 mg/dl. (She presents today with her meter, and logs showing at goal fasting glycemic profile.  Her previsit A1c, checked preop 9/7 was 8.5%.  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  She is healing from the surgery, noticed she is more tired than usual and has a mass near her surgical incision form over the last several days that is tender to touch.  She does note she has frequent symptoms of hypoglycemia throughout the day as well.) An ACE inhibitor/angiotensin II receptor blocker is being taken. She does not see a podiatrist.Eye exam is not current.  Hyperlipidemia This is a  chronic problem. The current episode started more than 1 year ago. The problem is controlled. Recent lipid tests were  reviewed and are normal. Exacerbating diseases include chronic renal disease and diabetes. Factors aggravating her hyperlipidemia include beta blockers, fatty foods and smoking. Pertinent negatives include no chest pain, myalgias or shortness of breath. Current antihyperlipidemic treatment includes statins. The current treatment provides mild improvement of lipids. Compliance problems include adherence to diet and adherence to exercise.  Risk factors for coronary artery disease include diabetes mellitus, dyslipidemia, hypertension, a sedentary lifestyle, post-menopausal and family history.  Hypertension This is a chronic problem. The current episode started more than 1 year ago. The problem has been resolved since onset. The problem is controlled. Associated symptoms include sweats. Pertinent negatives include no chest pain, headaches, palpitations or shortness of breath. There are no associated agents to hypertension. Risk factors for coronary artery disease include dyslipidemia, diabetes mellitus, sedentary lifestyle, smoking/tobacco exposure and post-menopausal state. Past treatments include ACE inhibitors and beta blockers. The current treatment provides moderate improvement. Compliance problems include exercise and diet.  Hypertensive end-organ damage includes kidney disease, CAD/MI and CVA. Identifiable causes of hypertension include chronic renal disease.    Review of systems  Constitutional: + decreasing weight,  current Body mass index is 23.21 kg/m. , + fatigue, no subjective hyperthermia, no subjective hypothermia Eyes: no blurry vision, no xerophthalmia ENT: no sore throat, no nodules palpated in throat, no dysphagia/odynophagia, no hoarseness Cardiovascular: no chest pain, no shortness of breath, no palpitations, no leg swelling Respiratory: no cough, no shortness of breath Gastrointestinal: no nausea/vomiting/diarrhea Musculoskeletal: no muscle/joint aches, mass on left lower quadrant of  abdomen near surgical site- tender to touch Skin: no rashes, no hyperemia Neurological: no tremors, no dizziness, + numbness/tingling to BLE (feet) Psychiatric: no depression, no anxiety  Objective:    BP (!) 93/53 (BP Location: Right Arm, Patient Position: Sitting, Cuff Size: Normal)   Pulse (!) 58   Ht 5' 6" (1.676 m)   Wt 143 lb 12.8 oz (65.2 kg)   BMI 23.21 kg/m   Wt Readings from Last 3 Encounters:  01/01/22 143 lb 12.8 oz (65.2 kg)  12/22/21 143 lb 11.8 oz (65.2 kg)  11/28/21 150 lb (68 kg)    BP Readings from Last 3 Encounters:  01/01/22 (!) 93/53  12/22/21 (!) 94/53  11/28/21 125/68     Physical Exam- Limited  Constitutional:  Body mass index is 23.21 kg/m. , not in acute distress Eyes:  EOMI, no exophthalmos Neck: Supple Cardiovascular: RRR, no murmurs, rubs, or gallops, no edema Respiratory: Adequate breathing efforts, no crackles, rales, rhonchi, or wheezing Musculoskeletal: no gross deformities, strength intact in all four extremities, no gross restriction of joint movements, mass on left lower quadrant of abdomen near surgical site- tender to touch Skin:  no rashes, no hyperemia Neurological: no tremor with outstretched hands   CMP     Component Value Date/Time   NA 136 11/28/2021 1150   NA 141 09/28/2021 0822   K 5.2 (H) 11/28/2021 1150   CL 108 11/28/2021 1150   CO2 19 (L) 11/28/2021 1150   GLUCOSE 151 (H) 11/28/2021 1150   BUN 65 (H) 11/28/2021 1150   BUN 30 (H) 09/28/2021 0822   CREATININE 3.34 (H) 11/28/2021 1150   CREATININE 1.38 (H) 08/20/2019 0719   CALCIUM 8.7 (L) 11/28/2021 1150   PROT 6.8 11/19/2021 0830   PROT 6.6 09/28/2021 0822   ALBUMIN 3.9 11/19/2021 0830   ALBUMIN 4.2 09/28/2021 0017  AST 14 (L) 11/19/2021 0830   ALT 12 11/19/2021 0830   ALKPHOS 69 11/19/2021 0830   BILITOT 0.5 11/19/2021 0830   BILITOT 0.3 09/28/2021 0822   GFRNONAA 15 (L) 11/28/2021 1150   GFRNONAA 40 (L) 08/20/2019 0719   GFRAA 51 (L) 03/26/2020 0927    GFRAA 47 (L) 08/20/2019 0719     Diabetic Labs (most recent): Lab Results  Component Value Date   HGBA1C 8.5 (H) 11/19/2021   HGBA1C 8.4 10/01/2021   HGBA1C 8.1 07/01/2021   MICROALBUR 26.7 08/20/2019    Lipid Panel     Component Value Date/Time   CHOL 161 03/30/2021 0803   TRIG 212 (H) 03/30/2021 0803   HDL 35 (L) 03/30/2021 0803   CHOLHDL 4.6 (H) 03/30/2021 0803   CHOLHDL 3.6 01/16/2019 0937   VLDL UNABLE TO CALCULATE IF TRIGLYCERIDE OVER 400 mg/dL 11/29/2014 0630   LDLCALC 90 03/30/2021 0803   LDLCALC 69 01/16/2019 0937   LABVLDL 36 03/30/2021 0803       Assessment & Plan:   1) DM type 2 causing vascular disease ([place])  - AKAYLAH LALLEY has currently uncontrolled symptomatic type 2 DM since  66 years of age.  She presents today with her meter, and logs showing at goal fasting glycemic profile.  Her previsit A1c, checked preop 9/7 was 8.5%.  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  She is healing from the surgery, noticed she is more tired than usual and has a mass near her surgical incision form over the last several days that is tender to touch.  She does note she has frequent symptoms of hypoglycemia throughout the day as well.  -Recent labs reviewed showing worsening kidney function- will refer to nephrology for further evaluation and treatment.  - I had a long discussion with her about the progressive nature of diabetes and the pathology behind its complications. -her diabetes is complicated by coronary artery disease, CVA, nephropathy, peripheral neuropathy, chronic heavy smoking, sedentary life and she remains at a high risk for more acute and chronic complications which include CAD, CVA, CKD, retinopathy, and neuropathy. These are all discussed in detail with her.  - Nutritional counseling repeated at each appointment due to patients tendency to fall back in to old habits.  - The patient admits there is a room for improvement in their diet and drink choices. -   Suggestion is made for the patient to avoid simple carbohydrates from their diet including Cakes, Sweet Desserts / Pastries, Ice Cream, Soda (diet and regular), Sweet Tea, Candies, Chips, Cookies, Sweet Pastries, Store Bought Juices, Alcohol in Excess of 1-2 drinks a day, Artificial Sweeteners, Coffee Creamer, and "Sugar-free" Products. This will help patient to have stable blood glucose profile and potentially avoid unintended weight gain.   - I encouraged the patient to switch to unprocessed or minimally processed complex starch and increased protein intake (animal or plant source), fruits, and vegetables.   - Patient is advised to stick to a routine mealtimes to eat 3 meals a day and avoid unnecessary snacks (to snack only to correct hypoglycemia).  - I have approached her with the following individualized plan to manage  her diabetes and patient agrees:   -Due to her frequent hypoglycemia and tightening fasting readings, will stop her Glipizide for now.  I did advise her to reach out to me if her glucose rebounds and she is getting reading over 130 consistently in the mornings.  - Specific targets for  A1c;  LDL,  HDL, Triglycerides, and  Waist Circumference were discussed with the patient.  2) Blood Pressure /Hypertension:  Her blood pressure is controlled to target.  She is advised to continue Lisinopril 20 mg po daily and Metoprolol 25 mg po twice daily.   3) Lipids/Hyperlipidemia:   Her most recent lipid panel from 03/30/21 shows controlled LDL of 90 and elevated triglycerides of 212.  She is advised to continue Crestor 20 mg po daily at bedtime.  Side effects and precautions discussed with her.  She is advised to avoid fried foods and butter.    4)  Weight/Diet:  Her Body mass index is 23.21 kg/m..  she is not a candidate for more weight loss.   Exercise, and detailed carbohydrates information provided  -  detailed on discharge instructions.  5) Chronic Care/Health Maintenance: -she is  on ACEI/ARB and Statin medications and is encouraged to initiate and continue to follow up with Ophthalmology, Dentist, Podiatrist at least yearly or according to recommendations, and advised to quit smoking. I have recommended yearly flu vaccine and pneumonia vaccine at least every 5 years; moderate intensity exercise for up to 150 minutes weekly; and sleep for at least 7 hours a day.  - she is advised to maintain close follow up with Redmond School, MD for primary care needs (will be calling him about her fatigue to have iron checked), as well as her other providers for optimal and coordinated care (also reaching out to surgeon about the mass that has appeared.       I spent 50 minutes in the care of the patient today including review of labs from [facility], Lipids, Thyroid Function, Hematology (current and previous including abstractions from other facilities); face-to-face time discussing  her blood glucose readings/logs, discussing hypoglycemia and hyperglycemia episodes and symptoms, medications doses, her options of short and long term treatment based on the latest standards of care / guidelines;  discussion about incorporating lifestyle medicine;  and documenting the encounter. Risk reduction counseling performed per USPSTF guidelines to reduce obesity and cardiovascular risk factors.     Please refer to Patient Instructions for Blood Glucose Monitoring and Insulin/Medications Dosing Guide"  in media tab for additional information. Please  also refer to " Patient Self Inventory" in the Media  tab for reviewed elements of pertinent patient history.  Shanon Ace participated in the discussions, expressed understanding, and voiced agreement with the above plans.  All questions were answered to her satisfaction. she is encouraged to contact clinic should she have any questions or concerns prior to her return visit.   Follow up plan: - Return in about 3 months (around 04/03/2022) for Diabetes F/U  with A1c in office, Bring meter and logs, No previsit labs.  Rayetta Pigg, [facility] [address] [place], [place] [address] Phone: [phone] Fax: [phone]  01/01/2022, 10:46 AM

## 2022-01-04 ENCOUNTER — Other Ambulatory Visit: Payer: Self-pay | Admitting: Nurse Practitioner

## 2022-01-04 ENCOUNTER — Other Ambulatory Visit (INDEPENDENT_AMBULATORY_CARE_PROVIDER_SITE_OTHER): Payer: Self-pay | Admitting: Gastroenterology

## 2022-01-15 ENCOUNTER — Telehealth: Payer: Self-pay | Admitting: *Deleted

## 2022-01-15 NOTE — Progress Notes (Signed)
  Care Coordination Note  01/15/2022 Name: Grace Hess MRN: [id number] DOB: 06-Feb-1956  [name] is a 66 y.o. year old female who is a primary care patient of Redmond School, MD and is actively engaged with the care management team. I reached out to Shanon Ace by phone today to assist with re-scheduling a follow up visit with the RN Case Manager  Follow up plan: Unsuccessful telephone outreach attempt made. A HIPAA compliant phone message was left for the patient providing contact information and requesting a return call.  [place]  Direct Dial: [phone]

## 2022-01-22 ENCOUNTER — Other Ambulatory Visit: Payer: Self-pay | Admitting: Internal Medicine

## 2022-01-22 DIAGNOSIS — Z1231 Encounter for screening mammogram for malignant neoplasm of breast: Secondary | ICD-10-CM

## 2022-01-25 NOTE — Progress Notes (Unsigned)
  Care Coordination Note  01/25/2022 Name: Grace Hess MRN: [id number] DOB: 06-May-1955  [name] is a 66 y.o. year old female who is a primary care patient of Redmond School, MD and is actively engaged with the care management team. I reached out to Shanon Ace by phone today to assist with re-scheduling a follow up visit with the RN Case Manager  Follow up plan: Unsuccessful telephone outreach attempt made. A HIPAA compliant phone message was left for the patient providing contact information and requesting a return call.   [place]  Direct Dial: [phone]

## 2022-01-27 NOTE — Progress Notes (Signed)
  Care Coordination Note  01/27/2022 Name: Grace Hess MRN: [id number] DOB: May 16, 1955  Grace Hess is a 66 y.o. year old female who is a primary care patient of Redmond School, MD and is actively engaged with the care management team. I reached out to Shanon Ace by phone today to assist with re-scheduling a follow up visit with the RN Case Manager  Follow up plan: Telephone appointment with care management team member scheduled for:02/01/22  [place]: [phone]

## 2022-01-29 ENCOUNTER — Inpatient Hospital Stay: Admission: RE | Admit: 2022-01-29 | Payer: HMO | Source: Ambulatory Visit

## 2022-01-31 ENCOUNTER — Encounter (INDEPENDENT_AMBULATORY_CARE_PROVIDER_SITE_OTHER): Payer: Self-pay | Admitting: Gastroenterology

## 2022-02-01 ENCOUNTER — Ambulatory Visit: Payer: Self-pay | Admitting: *Deleted

## 2022-02-01 NOTE — Patient Outreach (Signed)
  Care Coordination   02/01/2022 Name: Grace Hess MRN: [id number] DOB: 06-Feb-1956   Care Coordination Outreach Attempts:  An unsuccessful telephone outreach was attempted for a scheduled appointment today. Unsuccessful f/u #2.  Follow Up Plan:  Additional outreach attempts will be made to offer the patient care coordination information and services.   Encounter Outcome:  No Answer  Care Coordination Interventions Activated:  No   Care Coordination Interventions:  No, not indicated    Chong Sicilian, BSN, RN-BC RN Care Coordinator [place]: [phone] Main #: [phone]

## 2022-02-03 ENCOUNTER — Other Ambulatory Visit: Payer: Self-pay | Admitting: Cardiology

## 2022-02-22 ENCOUNTER — Ambulatory Visit: Payer: Self-pay | Admitting: *Deleted

## 2022-02-22 NOTE — Patient Outreach (Signed)
  Care Coordination   02/22/2022 Name: Grace Hess MRN: [id number] DOB: [birth year]   Care Coordination Outreach Attempts:  An unsuccessful telephone outreach was attempted for a scheduled appointment today. Unsuccessful attempt #3.  Follow Up Plan:  No further outreach attempts will be made at this time. We have been unable to contact the patient to offer or enroll patient in care coordination services  Encounter Outcome:  No Answer   Care Coordination Interventions:  No, not indicated    Chong Sicilian, BSN, RN-BC RN Care Coordinator [place]: [phone] Main #: [phone]

## 2022-03-12 ENCOUNTER — Other Ambulatory Visit: Payer: Self-pay | Admitting: *Deleted

## 2022-03-12 DIAGNOSIS — K439 Ventral hernia without obstruction or gangrene: Secondary | ICD-10-CM

## 2022-03-16 ENCOUNTER — Ambulatory Visit: Payer: HMO | Admitting: General Surgery

## 2022-03-18 ENCOUNTER — Other Ambulatory Visit: Payer: Self-pay | Admitting: Student

## 2022-03-18 ENCOUNTER — Other Ambulatory Visit (INDEPENDENT_AMBULATORY_CARE_PROVIDER_SITE_OTHER): Payer: Self-pay | Admitting: Gastroenterology

## 2022-03-26 ENCOUNTER — Telehealth: Payer: Self-pay | Admitting: Student

## 2022-03-26 MED ORDER — METOPROLOL TARTRATE 25 MG PO TABS: 25.0000 mg | ORAL_TABLET | Freq: Two times a day (BID) | ORAL | 1 refills | Status: DC

## 2022-03-26 MED ORDER — AMLODIPINE BESYLATE 5 MG PO TABS: 5.0000 mg | ORAL_TABLET | Freq: Every day | ORAL | 1 refills | Status: DC
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
## 2022-03-31 ENCOUNTER — Inpatient Hospital Stay: Payer: PPO | Attending: Hematology

## 2022-03-31 DIAGNOSIS — E876 Hypokalemia: Secondary | ICD-10-CM | POA: Diagnosis not present

## 2022-03-31 DIAGNOSIS — D631 Anemia in chronic kidney disease: Secondary | ICD-10-CM | POA: Insufficient documentation

## 2022-03-31 DIAGNOSIS — C642 Malignant neoplasm of left kidney, except renal pelvis: Secondary | ICD-10-CM | POA: Insufficient documentation

## 2022-03-31 DIAGNOSIS — F17210 Nicotine dependence, cigarettes, uncomplicated: Secondary | ICD-10-CM | POA: Diagnosis not present

## 2022-03-31 DIAGNOSIS — N189 Chronic kidney disease, unspecified: Secondary | ICD-10-CM | POA: Insufficient documentation

## 2022-03-31 LAB — COMPREHENSIVE METABOLIC PANEL
ALT: 15 U/L (ref 0–44)
AST: 18 U/L (ref 15–41)
Albumin: 3.8 g/dL (ref 3.5–5.0)
Alkaline Phosphatase: 87 U/L (ref 38–126)
Anion gap: 9 (ref 5–15)
BUN: 64 mg/dL — ABNORMAL HIGH (ref 8–23)
CO2: 13 mmol/L — ABNORMAL LOW (ref 22–32)
Calcium: 8.7 mg/dL — ABNORMAL LOW (ref 8.9–10.3)
Chloride: 110 mmol/L (ref 98–111)
Creatinine, Ser: 3.71 mg/dL — ABNORMAL HIGH (ref 0.44–1.00)
GFR, Estimated: 13 mL/min — ABNORMAL LOW (ref >60)
Glucose, Bld: 171 mg/dL — ABNORMAL HIGH (ref 70–99)
Potassium: 5.7 mmol/L — ABNORMAL HIGH (ref 3.5–5.1)
Sodium: 132 mmol/L — ABNORMAL LOW (ref 135–145)
Total Bilirubin: 0.3 mg/dL (ref 0.3–1.2)
Total Protein: 7 g/dL (ref 6.5–8.1)

## 2022-03-31 LAB — CBC WITH DIFFERENTIAL/PLATELET
Abs Immature Granulocytes: 0.05 10*3/uL (ref 0.00–0.07)
Basophils Absolute: 0.1 10*3/uL (ref 0.0–0.1)
Basophils Relative: 1 %
Eosinophils Absolute: 0.4 10*3/uL (ref 0.0–0.5)
Eosinophils Relative: 5 %
HCT: 33.8 % — ABNORMAL LOW (ref 36.0–46.0)
Hemoglobin: 10.4 g/dL — ABNORMAL LOW (ref 12.0–15.0)
Immature Granulocytes: 1 %
Lymphocytes Relative: 19 %
Lymphs Abs: 1.6 10*3/uL (ref 0.7–4.0)
MCH: 29.7 pg (ref 26.0–34.0)
MCHC: 30.8 g/dL (ref 30.0–36.0)
MCV: 96.6 fL (ref 80.0–100.0)
Monocytes Absolute: 0.8 10*3/uL (ref 0.1–1.0)
Monocytes Relative: 9 %
Neutro Abs: 5.5 10*3/uL (ref 1.7–7.7)
Neutrophils Relative %: 65 %
Platelets: 224 10*3/uL (ref 150–400)
RBC: 3.5 MIL/uL — ABNORMAL LOW (ref 3.87–5.11)
RDW: 15.9 % — ABNORMAL HIGH (ref 11.5–15.5)
WBC: 8.4 10*3/uL (ref 4.0–10.5)
nRBC: 0 % (ref 0.0–0.2)

## 2022-03-31 LAB — IRON AND TIBC
Iron: 40 ug/dL (ref 28–170)
Saturation Ratios: 13 % (ref 10.4–31.8)
TIBC: 307 ug/dL (ref 250–450)
UIBC: 267 ug/dL

## 2022-03-31 LAB — LACTATE DEHYDROGENASE: LDH: 123 U/L (ref 98–192)

## 2022-03-31 LAB — FERRITIN: Ferritin: 59 ng/mL (ref 11–307)

## 2022-04-01 DIAGNOSIS — C642 Malignant neoplasm of left kidney, except renal pelvis: Secondary | ICD-10-CM | POA: Diagnosis not present

## 2022-04-01 DIAGNOSIS — C649 Malignant neoplasm of unspecified kidney, except renal pelvis: Secondary | ICD-10-CM | POA: Diagnosis not present

## 2022-04-07 ENCOUNTER — Ambulatory Visit (INDEPENDENT_AMBULATORY_CARE_PROVIDER_SITE_OTHER): Payer: HMO | Admitting: Nurse Practitioner

## 2022-04-07 ENCOUNTER — Encounter: Payer: Self-pay | Admitting: Nurse Practitioner

## 2022-04-07 ENCOUNTER — Inpatient Hospital Stay (HOSPITAL_BASED_OUTPATIENT_CLINIC_OR_DEPARTMENT_OTHER): Payer: PPO | Admitting: Hematology

## 2022-04-07 VITALS — BP 91/52 | HR 51 | Ht 66.0 in | Wt 137.4 lb

## 2022-04-07 VITALS — BP 113/48 | HR 52 | Temp 98.3°F | Resp 17 | Ht 66.0 in | Wt 137.0 lb

## 2022-04-07 DIAGNOSIS — I10 Essential (primary) hypertension: Secondary | ICD-10-CM

## 2022-04-07 DIAGNOSIS — E1159 Type 2 diabetes mellitus with other circulatory complications: Secondary | ICD-10-CM

## 2022-04-07 DIAGNOSIS — N189 Chronic kidney disease, unspecified: Secondary | ICD-10-CM

## 2022-04-07 DIAGNOSIS — E875 Hyperkalemia: Secondary | ICD-10-CM | POA: Diagnosis not present

## 2022-04-07 DIAGNOSIS — C642 Malignant neoplasm of left kidney, except renal pelvis: Secondary | ICD-10-CM | POA: Diagnosis not present

## 2022-04-07 DIAGNOSIS — E782 Mixed hyperlipidemia: Secondary | ICD-10-CM

## 2022-04-07 DIAGNOSIS — E559 Vitamin D deficiency, unspecified: Secondary | ICD-10-CM | POA: Diagnosis not present

## 2022-04-07 DIAGNOSIS — D631 Anemia in chronic kidney disease: Secondary | ICD-10-CM

## 2022-04-07 LAB — POCT GLYCOSYLATED HEMOGLOBIN (HGB A1C): Hemoglobin A1C: 7.4 % — AB (ref 4.0–5.6)

## 2022-04-07 MED ORDER — SODIUM POLYSTYRENE SULFONATE 15 GM/60ML PO SUSP
60.0000 g | Freq: Once | ORAL | Status: AC
  Administered 2022-04-07: 60 g via ORAL
  Filled 2022-04-07: qty 240

## 2022-04-07 NOTE — Progress Notes (Signed)
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  NGS Results: not done  CURRENT THERAPY: Surveillance.  CANCER STAGING:  Cancer Staging  Renal cell cancer, left [facility]) Staging form: Kidney, AJCC 8th Edition - Clinical stage from 10/01/2021: Stage I (cT1a, cN0, cM0) - Signed by Derek Jack, MD on 12/22/2021   INTERVAL HISTORY:  Grace Hess, a 67 y.o. female, seen for follow-up of kidney cancer.  Reports some hernia on the left side of the abdomen.  Denies any new onset pains.  She has seen Dr. Louis Meckel in December.  She reports drinking orange juice twice weekly.  REVIEW OF SYSTEMS:  Review of Systems  Respiratory:  Positive for shortness of breath.   Gastrointestinal:  Positive for nausea.  Neurological:  Positive for dizziness.  All other systems reviewed and are negative.   PAST MEDICAL/SURGICAL HISTORY:  Past Medical History:  Diagnosis Date   Anxiety    Arthritis    "might have a touch in my fingers" (08/02/2016)   CAD (coronary artery disease)    a. s/p DES to RCA in 07/2016 with residual mid-LAD stenosis   Cancer ([place])    skin cancer on finger   Cervicalgia    Chest pain, unspecified    Depression    GERD (gastroesophageal reflux disease)    Headache    "bad ones after my stroke in 2016; only have them when I get bad news now" (08/02/2016)   Heart murmur    Heavy smoker (more than 20 cigarettes per day)    Scheduled for LDCT screening 02/11/15   History of hiatal hernia    History of kidney stones    "no cysto/OR" (12/10/2014)   Hypercholesteremia    Hypertension    Obesity    Reflux esophagitis    Stroke [facility])    "they said I had a silent stroke a few months  back/MRI" (12/10/2014)   TIA (transient ischemic attack) 11/28/2014   [name] 11/28/2014   Type II diabetes mellitus ([place])    Past Surgical History:  Procedure Laterality Date   ABDOMINAL AORTOGRAM N/A 08/02/2016   Procedure: Abdominal Aortogram;  Surgeon: Nelva Bush, MD;  Location: [place] [facility];  Service: Cardiovascular;  Laterality: N/A;   CAROTID STENT     CHOLECYSTECTOMY OPEN  1978   COLONOSCOPY  2010   single diverticulum in sigmoid colon   CORONARY ANGIOPLASTY WITH STENT PLACEMENT  08/02/2016   to RCA   CORONARY STENT INTERVENTION N/A 08/02/2016   Procedure: Coronary Stent Intervention;  Surgeon: Nelva Bush, MD;  Location: [place] [facility];  Service: Cardiovascular;  Laterality: N/A;  RCA   CYSTOSCOPY W/ RETROGRADES Bilateral 07/15/2021   Procedure: CYSTOSCOPY WITH RETROGRADE PYELOGRAM;  Surgeon: Primus Bravo., MD;  Location: [facility];  Service: Urology;  Laterality: Bilateral;   CYSTOSCOPY WITH STENT PLACEMENT Left 07/15/2021   Procedure: CYSTOSCOPY WITH STENT PLACEMENT;  Surgeon: Primus Bravo., MD;  Location: [facility];  Service: Urology;  Laterality: Left;   ENDARTERECTOMY Left 12/02/2014   Procedure: ENDARTERECTOMY CAROTID;  Surgeon: Rosetta Posner, MD;  Location: [place];  Service: Vascular;  Laterality: Left;   LAPAROSCOPIC NEPHRECTOMY Left    LAPAROSCOPIC NEPHRECTOMY Left 11/25/2021   Procedure:  LEFT LAPAROSCOPIC RADICAL NEPHRECTOMY;  Surgeon: Ardis Hughs, MD;  Location: [facility];  Service: Urology;  Laterality: Left;   LEFT HEART CATH AND CORONARY ANGIOGRAPHY N/A 08/02/2016   Procedure: Left Heart Cath and Coronary Angiography;  Surgeon: Nelva Bush, MD;  Location: [place] [facility];  Service: Cardiovascular;  Laterality: N/A;   LOWER EXTREMITY ANGIOGRAM  08/02/2016   LOWER EXTREMITY ANGIOGRAPHY N/A 08/02/2016   Procedure: Lower Extremity Angiography;  Surgeon: Nelva Bush, MD;  Location: [place] [facility];  Service:  Cardiovascular;  Laterality: N/A;   NEPHRECTOMY Left    VAGINAL HYSTERECTOMY  1991   "partial"    SOCIAL HISTORY:  Social History   Socioeconomic History   Marital status: Married    Spouse name: Not on file   Number of children: 2   Years of education: 12   Highest education level: Not on file  Occupational History   Not on file  Tobacco Use   Smoking status: Every Day    Packs/day: 2.00    Years: 43.00    Total pack years: 86.00    Types: Cigarettes    Start date: 05/17/1974   Smokeless tobacco: Never  Vaping Use   Vaping Use: Never used  Substance and Sexual Activity   Alcohol use: No    Alcohol/week: 0.0 standard drinks of alcohol   Drug use: No   Sexual activity: Not Currently  Other Topics Concern   Not on file  Social History Narrative   Patient does not drink caffeine.   Patient is right handed.    Social Determinants of Health   Financial Resource Strain: Not on file  Food Insecurity: No Food Insecurity (11/25/2021)   Hunger Vital Sign    Worried About Running Out of Food in the Last Year: Never true    Ran Out of Food in the Last Year: Never true  Transportation Needs: No Transportation Needs (11/25/2021)   PRAPARE - Hydrologist (Medical): No    Lack of Transportation (Non-Medical): No  Physical Activity: Not on file  Stress: Not on file  Social Connections: Not on file  Intimate Partner Violence: Not At Risk (11/25/2021)   Humiliation, Afraid, Rape, and Kick questionnaire    Fear of Current or Ex-Partner: No    Emotionally Abused: No    Physically Abused: No    Sexually Abused: No    FAMILY HISTORY:  Family History  Problem Relation Age of Onset   Congestive Heart Failure Mother    COPD Father    Cancer Brother    Cancer Brother     CURRENT MEDICATIONS:  Current Outpatient Medications  Medication Sig Dispense Refill   acetaminophen (TYLENOL) 325 MG tablet Take 2 tablets (650 mg total) by mouth every 4 (four)  hours as needed for headache or mild pain. (Patient taking differently: Take 500 mg by mouth every 4 (four) hours as needed for headache or mild pain.)     albuterol (VENTOLIN HFA) 108 (90 Base) MCG/ACT inhaler Inhale 1-2 puffs into the lungs every 4 (four) hours as needed for shortness of breath or wheezing.     ALPRAZolam (XANAX) 0.5 MG tablet Take 0.25 mg by mouth 2 (two) times daily.     amLODipine (NORVASC) 5 MG tablet Take 1 tablet (5 mg total) by mouth daily. 30 tablet 1   aspirin EC 81 MG tablet Take 81 mg by mouth daily as needed (Chest pains). Swallow whole.     benzonatate (  TESSALON) 100 MG capsule Take 100 mg by mouth 4 (four) times daily as needed for cough.     Biotin 5000 MCG CAPS Take 5,000 mcg by mouth daily.     Blood Glucose Monitoring Suppl (ACCU-CHEK GUIDE) w/Device KIT 1 Piece by Does not apply route as directed. 1 kit 0   cephALEXin (KEFLEX) 500 MG capsule Take 1 capsule (500 mg total) by mouth 4 (four) times daily. 28 capsule 0   Cholecalciferol (VITAMIN D3) 125 MCG (5000 UT) CAPS Take 5,000 Units by mouth once a week. Tuesday     clopidogrel (PLAVIX) 75 MG tablet Take 1 tablet (75 mg total) by mouth daily. 30 tablet 3   escitalopram (LEXAPRO) 20 MG tablet Take 20 mg by mouth daily.     gabapentin (NEURONTIN) 300 MG capsule Take 300 mg by mouth at bedtime.     glucose blood (ACCU-CHEK GUIDE) test strip Use as instructed 150 each 2   metoprolol tartrate (LOPRESSOR) 25 MG tablet Take 1 tablet (25 mg total) by mouth 2 (two) times daily. Please keep scheduled appointment for future refills. Thank you. 60 tablet 1   nicotine (NICODERM CQ - DOSED IN MG/24 HOURS) 21 mg/24hr patch Place 1 patch (21 mg total) onto the skin daily. 30 patch 1   nystatin cream (MYCOSTATIN) Apply 1 application  topically 2 (two) times daily as needed (Itching).  5   ondansetron (ZOFRAN) 4 MG tablet TAKE 1 TABLET(4 MG) BY MOUTH TWICE DAILY AS NEEDED FOR NAUSEA OR VOMITING (Patient not taking: Reported on  04/07/2022) 20 tablet 0   oxyCODONE-acetaminophen (PERCOCET/ROXICET) 5-325 MG tablet Take 1 tablet by mouth 3 (three) times daily as needed.     pantoprazole (PROTONIX) 40 MG tablet TAKE 1 TABLET(40 MG) BY MOUTH DAILY BEFORE BREAKFAST 90 tablet 0   promethazine (PHENERGAN) 25 MG tablet Take 25 mg by mouth 4 (four) times daily as needed.     rosuvastatin (CRESTOR) 20 MG tablet TAKE 1 TABLET(20 MG) BY MOUTH DAILY 90 tablet 0   No current facility-administered medications for this visit.    ALLERGIES:  Allergies  Allergen Reactions   Codeine Shortness Of Breath   Latex Itching   Sulfa Antibiotics Other (See Comments)    Reaction:  Unknown     PHYSICAL EXAM:  Performance status (ECOG): 1 - Symptomatic but completely ambulatory  There were no vitals filed for this visit.  Wt Readings from Last 3 Encounters:  04/07/22 137 lb 6.4 oz (62.3 kg)  01/01/22 143 lb 12.8 oz (65.2 kg)  12/22/21 143 lb 11.8 oz (65.2 kg)   Physical Exam Vitals reviewed.  Constitutional:      Appearance: Normal appearance.  Cardiovascular:     Rate and Rhythm: Normal rate and regular rhythm.     Pulses: Normal pulses.     Heart sounds: Normal heart sounds.  Pulmonary:     Effort: Pulmonary effort is normal.     Breath sounds: Normal breath sounds.  Neurological:     General: No focal deficit present.     Mental Status: She is alert and oriented to person, place, and time.  Psychiatric:        Mood and Affect: Mood normal.        Behavior: Behavior normal.   Left flank area incisions are well-healed.  LABORATORY DATA:  I have reviewed the labs as listed.     Latest Ref Rng & Units 03/31/2022   11:14 AM 11/28/2021   11:50 AM 11/26/2021  5:22 AM  CBC  WBC 4.0 - 10.5 K/uL 8.4  13.1    Hemoglobin 12.0 - 15.0 g/dL 10.4  11.4  10.7   Hematocrit 36.0 - 46.0 % 33.8  36.2  33.3   Platelets 150 - 400 K/uL 224  223        Latest Ref Rng & Units 03/31/2022   11:14 AM 11/28/2021   11:50 AM 11/26/2021     5:22 AM  CMP  Glucose 70 - 99 mg/dL 171  151  257   BUN 8 - 23 mg/dL 64  65  49   Creatinine 0.44 - 1.00 mg/dL 3.71  3.34  2.58   Sodium 135 - 145 mmol/L 132  136  132   Potassium 3.5 - 5.1 mmol/L 5.7  5.2  5.4   Chloride 98 - 111 mmol/L 110  108  105   CO2 22 - 32 mmol/L '13  19  20   '$ Calcium 8.9 - 10.3 mg/dL 8.7  8.7  8.6   Total Protein 6.5 - 8.1 g/dL 7.0     Total Bilirubin 0.3 - 1.2 mg/dL 0.3     Alkaline Phos 38 - 126 U/L 87     AST 15 - 41 U/L 18     ALT 0 - 44 U/L 15       DIAGNOSTIC IMAGING:  I have independently reviewed the scans and discussed with the patient. No results found.   ASSESSMENT:  Left kidney clear cell renal cell carcinoma (T1AN0): - CT renal stone study (07/13/2021): Compensated hypertrophy of the left kidney, 5.4 x 3.7 x 4.4 cm lobulated mildly hyperdense soft tissue mass within the anteromedial aspect of the mid to lower left kidney.  Moderate to marked severity left-sided hydronephrosis.  Numerous bilateral nonobstructing renal calculi. - MRI abdomen (08/14/2021): Central lower pole left kidney 4 x 3.7 x 5.3 cm enhancing mass, extension into the proximal left ureter.  No renal vein involvement or evidence of abdominal metastatic disease.  Multiple small abdominal retroperitoneal lymph nodes, nonpathologic by size criteria.  Right abdominal wall subcutaneous nodule nonspecific but new since 07/13/2021. - Left kidney biopsy (09/10/2021): Clear-cell renal cell carcinoma, nuclear grade 2.  No tumor necrosis. - Left nephrectomy on 11/25/2021: Pathology 3.2 cm clear-cell renal cell carcinoma, grade 4, negative sarcoid features, focal rhabdoid features, tumor necrosis negative.  PT1APN0.    Social/family history: - She lives at home with her husband.  She retired after working in [employer]  She is current active smoker, 2 packs/day for 48 years. - Brother had throat cancer and was smoker.  Maternal uncle died of cancer.  Paternal uncle also had cancer.  Maternal  grandmother had lung cancer.   PLAN:  Stage I (T1 aN0 M0) left kidney clear-cell renal cell carcinoma, grade 4: -She does not have any new onset pains. - Reviewed labs from 03/31/2022 with normal LFTs.  CBC shows normocytic anemia.  LDH was normal. - Recommend follow-up in 3 months with CT CAP.  2.  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 - Ferritin is 59 and percent saturation 13. - Recommend starting iron tablet with stool softener. - Will repeat ferritin and iron panel in 3 months.  If no improvement, consider parenteral iron therapy.  3.  Hypokalemia: - Potassium is 5.7.  We have given a list of low potassium foods. - She will be given Kayexalate today. - Will make a referral to follow-up with Dr. Theador Hawthorne as soon as  possible.    Orders placed this encounter:  No orders of the defined types were placed in this encounter.     Derek Jack, MD [place] [phone]

## 2022-04-07 NOTE — Patient Instructions (Addendum)
[place] at [facility] Discharge Instructions   You were seen and examined today by Dr. Delton Coombes.  He reviewed the results of your lab work. You potassium is very high at 5.7. You need to avoid fruit juices and avoid high potassium foods. You were given a table of low potassium foods. Your iron is also very low given your kidney condition. You should take iron pill daily - this is an over the counter pill. Take 325 mg of iron daily.   You need to follow up with Dr. Theador Hawthorne.   Return as scheduled.      Thank you for choosing [place] at [facility] to provide your oncology and hematology care.  To afford each patient quality time with our provider, please arrive at least 15 minutes before your scheduled appointment time.   If you have a lab appointment with the [facility] please come in thru the Main Entrance and check in at the main information desk.  You need to re-schedule your appointment should you arrive 10 or more minutes late.  We strive to give you quality time with our providers, and arriving late affects you and other patients whose appointments are after yours.  Also, if you no show three or more times for appointments you may be dismissed from the clinic at the providers discretion.     Again, thank you for choosing [facility].  Our hope is that these requests will decrease the amount of time that you wait before being seen by our physicians.       _____________________________________________________________  Should you have questions after your visit to [facility], please contact our office at [phone] and follow the prompts.  Our office hours are 8:00 a.m. and 4:30 p.m. Monday - Friday.  Please note that voicemails left after 4:00 p.m. may not be returned until the following business day.  We are closed weekends and major holidays.  You do have access to a nurse 24-7, just call the main  number to the clinic [phone] and do not press any options, hold on the line and a nurse will answer the phone.    For prescription refill requests, have your pharmacy contact our office and allow 72 hours.    Due to Covid, you will need to wear a mask upon entering the hospital. If you do not have a mask, a mask will be given to you at the Main Entrance upon arrival. For doctor visits, patients may have 1 support person age 42 or older with them. For treatment visits, patients can not have anyone with them due to social distancing guidelines and our immunocompromised population.

## 2022-04-07 NOTE — Progress Notes (Signed)
04/07/2022, 11:11 AM                                Endocrinology follow-up note   Subjective:    Patient ID: Grace Hess, female    DOB: [birth year].  Grace Hess is being seen in follow-up for management of currently uncontrolled symptomatic diabetes requested by  Redmond School, MD.   Past Medical History:  Diagnosis Date   Anxiety    Arthritis    "might have a touch in my fingers" (08/02/2016)   CAD (coronary artery disease)    a. s/p DES to RCA in 07/2016 with residual mid-LAD stenosis   Cancer ([place])    skin cancer on finger   Cervicalgia    Chest pain, unspecified    Depression    GERD (gastroesophageal reflux disease)    Headache    "bad ones after my stroke in 2016; only have them when I get bad news now" (08/02/2016)   Heart murmur    Heavy smoker (more than 20 cigarettes per day)    Scheduled for LDCT screening 02/11/15   History of hiatal hernia    History of kidney stones    "no cysto/OR" (12/10/2014)   Hypercholesteremia    Hypertension    Obesity    Reflux esophagitis    Stroke [facility])    "they said I had a silent stroke a few months back/MRI" (12/10/2014)   TIA (transient ischemic attack) 11/28/2014   [name] 11/28/2014   Type II diabetes mellitus ([place])     Past Surgical History:  Procedure Laterality Date   ABDOMINAL AORTOGRAM N/A 08/02/2016   Procedure: Abdominal Aortogram;  Surgeon: Nelva Bush, MD;  Location: [place] [facility];  Service: Cardiovascular;  Laterality: N/A;   CAROTID STENT     CHOLECYSTECTOMY OPEN  1978   COLONOSCOPY  2010   single diverticulum in sigmoid colon   CORONARY ANGIOPLASTY WITH STENT PLACEMENT  08/02/2016   to RCA   CORONARY STENT INTERVENTION N/A 08/02/2016   Procedure: Coronary Stent Intervention;  Surgeon: Nelva Bush, MD;  Location: [place] [facility];  Service: Cardiovascular;  Laterality: N/A;  RCA   CYSTOSCOPY W/  RETROGRADES Bilateral 07/15/2021   Procedure: CYSTOSCOPY WITH RETROGRADE PYELOGRAM;  Surgeon: Primus Bravo., MD;  Location: [facility];  Service: Urology;  Laterality: Bilateral;   CYSTOSCOPY WITH STENT PLACEMENT Left 07/15/2021   Procedure: CYSTOSCOPY WITH STENT PLACEMENT;  Surgeon: Primus Bravo., MD;  Location: [facility];  Service: Urology;  Laterality: Left;   ENDARTERECTOMY Left 12/02/2014   Procedure: ENDARTERECTOMY CAROTID;  Surgeon: Rosetta Posner, MD;  Location: [place];  Service: Vascular;  Laterality: Left;   LAPAROSCOPIC NEPHRECTOMY Left    LAPAROSCOPIC NEPHRECTOMY Left 11/25/2021   Procedure: LEFT LAPAROSCOPIC RADICAL NEPHRECTOMY;  Surgeon: Ardis Hughs, MD;  Location: [facility];  Service: Urology;  Laterality: Left;   LEFT HEART CATH AND CORONARY ANGIOGRAPHY N/A 08/02/2016   Procedure: Left Heart Cath and Coronary Angiography;  Surgeon: Nelva Bush, MD;  Location: [place] [facility];  Service: Cardiovascular;  Laterality: N/A;   LOWER EXTREMITY ANGIOGRAM  08/02/2016   LOWER EXTREMITY ANGIOGRAPHY N/A 08/02/2016   Procedure: Lower Extremity Angiography;  Surgeon: Nelva Bush, MD;  Location: [place] [facility];  Service: Cardiovascular;  Laterality: N/A;   NEPHRECTOMY Left    VAGINAL HYSTERECTOMY  1991   "partial"    Social History   Socioeconomic History   Marital status: Married    Spouse name: Not on file   Number of children: 2   Years of education: 12   Highest education level: Not on file  Occupational History   Not on file  Tobacco Use   Smoking status: Every Day    Packs/day: 2.00    Years: 43.00    Total pack years: 86.00    Types: Cigarettes    Start date: 05/17/1974   Smokeless tobacco: Never  Vaping Use   Vaping Use: Never used  Substance and Sexual Activity   Alcohol use: No    Alcohol/week: 0.0 standard drinks of alcohol   Drug use: No   Sexual activity: Not Currently  Other Topics Concern   Not on file  Social History Narrative    Patient does not drink caffeine.   Patient is right handed.    Social Determinants of Health   Financial Resource Strain: Not on file  Food Insecurity: No Food Insecurity (11/25/2021)   Hunger Vital Sign    Worried About Running Out of Food in the Last Year: Never true    Ran Out of Food in the Last Year: Never true  Transportation Needs: No Transportation Needs (11/25/2021)   PRAPARE - Hydrologist (Medical): No    Lack of Transportation (Non-Medical): No  Physical Activity: Not on file  Stress: Not on file  Social Connections: Not on file    Family History  Problem Relation Age of Onset   Congestive Heart Failure Mother    COPD Father    Cancer Brother    Cancer Brother     Outpatient Encounter Medications as of 04/07/2022  Medication Sig   acetaminophen (TYLENOL) 325 MG tablet Take 2 tablets (650 mg total) by mouth every 4 (four) hours as needed for headache or mild pain. (Patient taking differently: Take 500 mg by mouth every 4 (four) hours as needed for headache or mild pain.)   albuterol (VENTOLIN HFA) 108 (90 Base) MCG/ACT inhaler Inhale 1-2 puffs into the lungs every 4 (four) hours as needed for shortness of breath or wheezing.   ALPRAZolam (XANAX) 0.5 MG tablet Take 0.25 mg by mouth 2 (two) times daily.   amLODipine (NORVASC) 5 MG tablet Take 1 tablet (5 mg total) by mouth daily.   aspirin EC 81 MG tablet Take 81 mg by mouth daily as needed (Chest pains). Swallow whole.   benzonatate (TESSALON) 100 MG capsule Take 100 mg by mouth 4 (four) times daily as needed for cough.   Biotin 5000 MCG CAPS Take 5,000 mcg by mouth daily.   Blood Glucose Monitoring Suppl (ACCU-CHEK GUIDE) w/Device KIT 1 Piece by Does not apply route as directed.   cephALEXin (KEFLEX) 500 MG capsule Take 1 capsule (500 mg total) by mouth 4 (four) times daily.   Cholecalciferol (VITAMIN D3) 125 MCG (5000 UT) CAPS Take 5,000 Units by mouth once a week. Tuesday   clopidogrel  (PLAVIX) 75 MG tablet Take 1 tablet (75 mg total) by mouth daily.   escitalopram (LEXAPRO) 20 MG tablet Take 20  mg by mouth daily.   gabapentin (NEURONTIN) 300 MG capsule Take 300 mg by mouth at bedtime.   glucose blood (ACCU-CHEK GUIDE) test strip Use as instructed   metoprolol tartrate (LOPRESSOR) 25 MG tablet Take 1 tablet (25 mg total) by mouth 2 (two) times daily. Please keep scheduled appointment for future refills. Thank you.   nicotine (NICODERM CQ - DOSED IN MG/24 HOURS) 21 mg/24hr patch Place 1 patch (21 mg total) onto the skin daily.   nystatin cream (MYCOSTATIN) Apply 1 application  topically 2 (two) times daily as needed (Itching).   oxyCODONE-acetaminophen (PERCOCET/ROXICET) 5-325 MG tablet Take 1 tablet by mouth 3 (three) times daily as needed.   pantoprazole (PROTONIX) 40 MG tablet TAKE 1 TABLET(40 MG) BY MOUTH DAILY BEFORE BREAKFAST   promethazine (PHENERGAN) 25 MG tablet Take 25 mg by mouth 4 (four) times daily as needed.   rosuvastatin (CRESTOR) 20 MG tablet TAKE 1 TABLET(20 MG) BY MOUTH DAILY   ondansetron (ZOFRAN) 4 MG tablet TAKE 1 TABLET(4 MG) BY MOUTH TWICE DAILY AS NEEDED FOR NAUSEA OR VOMITING (Patient not taking: Reported on 04/07/2022)   No facility-administered encounter medications on file as of 04/07/2022.    ALLERGIES: Allergies  Allergen Reactions   Codeine Shortness Of Breath   Latex Itching   Sulfa Antibiotics Other (See Comments)    Reaction:  Unknown     VACCINATION STATUS: Immunization History  Administered Date(s) Administered   Influenza,inj,Quad PF,6+ Mos 11/29/2014   Pneumococcal Polysaccharide-23 11/29/2014    Diabetes She presents for her follow-up diabetic visit. She has type 2 diabetes mellitus. Onset time: She was diagnosed at approximate age of 39 years. Her disease course has been improving. There are no hypoglycemic associated symptoms. Pertinent negatives for hypoglycemia include no confusion, headaches, pallor or seizures. Associated  symptoms include fatigue, foot paresthesias and weight loss. Pertinent negatives for diabetes include no chest pain, no polydipsia, no polyphagia and no polyuria. There are no hypoglycemic complications. Symptoms are stable. Diabetic complications include a CVA, heart disease and nephropathy. Risk factors for coronary artery disease include dyslipidemia, diabetes mellitus, family history, hypertension, tobacco exposure, sedentary lifestyle and post-menopausal. Current diabetic treatment includes diet. She is compliant with treatment most of the time. Her weight is decreasing steadily. She is following a generally healthy diet. When asked about meal planning, she reported none. She has not had a previous visit with a dietitian. She never participates in exercise. Her overall blood glucose range is 140-180 mg/dl. (She presents today with no meter or logs to review.  She was not asked to routinely monitor as she was taken off her DM meds last visit.  Her POCT A1c today is 7.4%, decreasing from last visit of 8.5%.  She notes she still has not had a good appetite.) An ACE inhibitor/angiotensin II receptor blocker is being taken. She does not see a podiatrist.Eye exam is not current.  Hyperlipidemia This is a chronic problem. The current episode started more than 1 year ago. The problem is controlled. Recent lipid tests were reviewed and are normal. Exacerbating diseases include chronic renal disease and diabetes. Factors aggravating her hyperlipidemia include beta blockers, fatty foods and smoking. Pertinent negatives include no chest pain, myalgias or shortness of breath. Current antihyperlipidemic treatment includes statins. The current treatment provides mild improvement of lipids. Compliance problems include adherence to diet and adherence to exercise.  Risk factors for coronary artery disease include diabetes mellitus, dyslipidemia, hypertension, a sedentary lifestyle, post-menopausal and family history.   Hypertension This  is a chronic problem. The current episode started more than 1 year ago. The problem has been resolved since onset. The problem is controlled. Pertinent negatives include no chest pain, headaches, palpitations or shortness of breath. There are no associated agents to hypertension. Risk factors for coronary artery disease include dyslipidemia, diabetes mellitus, sedentary lifestyle, smoking/tobacco exposure and post-menopausal state. Past treatments include ACE inhibitors and beta blockers. The current treatment provides moderate improvement. Compliance problems include exercise and diet.  Hypertensive end-organ damage includes kidney disease, CAD/MI and CVA. Identifiable causes of hypertension include chronic renal disease.    Review of systems  Constitutional: + decreasing weight,  current Body mass index is 22.18 kg/m. , + fatigue, no subjective hyperthermia, no subjective hypothermia, + decreased appetite Eyes: no blurry vision, no xerophthalmia ENT: no sore throat, no nodules palpated in throat, no dysphagia/odynophagia, no hoarseness Cardiovascular: no chest pain, no shortness of breath, no palpitations, no leg swelling Respiratory: no cough, no shortness of breath Gastrointestinal: no nausea/vomiting/diarrhea Musculoskeletal: no muscle/joint aches, mass on left lower quadrant of abdomen near surgical site- seeing surgeon soon Skin: no rashes, no hyperemia Neurological: no tremors, no dizziness, + numbness/tingling to BLE (feet) Psychiatric: no depression, no anxiety  Objective:    BP (!) 91/52 (BP Location: Right Arm, Patient Position: Sitting, Cuff Size: Normal)   Pulse (!) 51   Ht '5\' 6"'$  (1.676 m)   Wt 137 lb 6.4 oz (62.3 kg)   BMI 22.18 kg/m   Wt Readings from Last 3 Encounters:  04/07/22 137 lb 6.4 oz (62.3 kg)  01/01/22 143 lb 12.8 oz (65.2 kg)  12/22/21 143 lb 11.8 oz (65.2 kg)    BP Readings from Last 3 Encounters:  04/07/22 (!) 91/52  01/01/22 (!)  93/53  12/22/21 (!) 94/53     Physical Exam- Limited  Constitutional:  Body mass index is 22.18 kg/m. , not in acute distress Eyes:  EOMI, no exophthalmos Musculoskeletal: no gross deformities, strength intact in all four extremities, no gross restriction of joint movements, mass on left lower quadrant of abdomen near surgical site (hernia) Skin:  no rashes, no hyperemia Neurological: no tremor with outstretched hands  Diabetic Foot Exam - Simple   Simple Foot Form Diabetic Foot exam was performed with the following findings: Yes 04/07/2022 11:07 AM  Visual Inspection No deformities, no ulcerations, no other skin breakdown bilaterally: Yes Sensation Testing Intact to touch and monofilament testing bilaterally: Yes Pulse Check Posterior Tibialis and Dorsalis pulse intact bilaterally: Yes Comments    CMP     Component Value Date/Time   NA 132 (L) 03/31/2022 1114   NA 141 09/28/2021 0822   K 5.7 (H) 03/31/2022 1114   CL 110 03/31/2022 1114   CO2 13 (L) 03/31/2022 1114   GLUCOSE 171 (H) 03/31/2022 1114   BUN 64 (H) 03/31/2022 1114   BUN 30 (H) 09/28/2021 0822   CREATININE 3.71 (H) 03/31/2022 1114   CREATININE 1.38 (H) 08/20/2019 0719   CALCIUM 8.7 (L) 03/31/2022 1114   PROT 7.0 03/31/2022 1114   PROT 6.6 09/28/2021 0822   ALBUMIN 3.8 03/31/2022 1114   ALBUMIN 4.2 09/28/2021 0822   AST 18 03/31/2022 1114   ALT 15 03/31/2022 1114   ALKPHOS 87 03/31/2022 1114   BILITOT 0.3 03/31/2022 1114   BILITOT 0.3 09/28/2021 0822   GFRNONAA 13 (L) 03/31/2022 1114   GFRNONAA 40 (L) 08/20/2019 0719   GFRAA 51 (L) 03/26/2020 0927   GFRAA 47 (L) 08/20/2019 0719  Diabetic Labs (most recent): Lab Results  Component Value Date   HGBA1C 7.4 (A) 04/07/2022   HGBA1C 8.5 (H) 11/19/2021   HGBA1C 8.4 10/01/2021   MICROALBUR 26.7 08/20/2019    Lipid Panel     Component Value Date/Time   CHOL 161 03/30/2021 0803   TRIG 212 (H) 03/30/2021 0803   HDL 35 (L) 03/30/2021 0803    CHOLHDL 4.6 (H) 03/30/2021 0803   CHOLHDL 3.6 01/16/2019 0937   VLDL UNABLE TO CALCULATE IF TRIGLYCERIDE OVER 400 mg/dL 11/29/2014 0630   LDLCALC 90 03/30/2021 0803   LDLCALC 69 01/16/2019 0937   LABVLDL 36 03/30/2021 0803       Assessment & Plan:   1) DM type 2 causing vascular disease ([place])  - KERRIA SAPIEN has currently uncontrolled symptomatic type 2 DM since  67 years of age.  She presents today with no meter or logs to review.  She was not asked to routinely monitor as she was taken off her DM meds last visit.  Her POCT A1c today is 7.4%, decreasing from last visit of 8.5%.  She notes she still has not had a good appetite.  -Recent labs reviewed showing worsening kidney function- will refer to nephrology for further evaluation and treatment.  - I had a long discussion with her about the progressive nature of diabetes and the pathology behind its complications. -her diabetes is complicated by coronary artery disease, CVA, nephropathy, peripheral neuropathy, chronic heavy smoking, sedentary life and she remains at a high risk for more acute and chronic complications which include CAD, CVA, CKD, retinopathy, and neuropathy. These are all discussed in detail with her.  - Nutritional counseling repeated at each appointment due to patients tendency to fall back in to old habits.  - The patient admits there is a room for improvement in their diet and drink choices. -  Suggestion is made for the patient to avoid simple carbohydrates from their diet including Cakes, Sweet Desserts / Pastries, Ice Cream, Soda (diet and regular), Sweet Tea, Candies, Chips, Cookies, Sweet Pastries, Store Bought Juices, Alcohol in Excess of 1-2 drinks a day, Artificial Sweeteners, Coffee Creamer, and "Sugar-free" Products. This will help patient to have stable blood glucose profile and potentially avoid unintended weight gain.   - I encouraged the patient to switch to unprocessed or minimally processed  complex starch and increased protein intake (animal or plant source), fruits, and vegetables.   - Patient is advised to stick to a routine mealtimes to eat 3 meals a day and avoid unnecessary snacks (to snack only to correct hypoglycemia).  - I have approached her with the following individualized plan to manage  her diabetes and patient agrees:   -Given her improved glycemic profile off medications, will allow her to stay off meds at this time.  We discussed importance of diet and exercise to stay off meds in the future.  She is not a candidate for Metformin due to kidney disease, she does not meet criteria for GLP1 use with BMI less than 25.  - Specific targets for  A1c;  LDL, HDL, Triglycerides, and  Waist Circumference were discussed with the patient.  2) Blood Pressure /Hypertension:  Her blood pressure is controlled to target.  She is advised to continue Lisinopril 20 mg po daily and Metoprolol 25 mg po twice daily.   3) Lipids/Hyperlipidemia:   Her most recent lipid panel from 03/30/21 shows controlled LDL of 90 and elevated triglycerides of 212.  She is advised to  continue Crestor 20 mg po daily at bedtime.  Side effects and precautions discussed with her.  She is advised to avoid fried foods and butter.    4)  Weight/Diet:  Her Body mass index is 22.18 kg/m..  she is not a candidate for more weight loss.   Exercise, and detailed carbohydrates information provided  -  detailed on discharge instructions.  5) Chronic Care/Health Maintenance: -she is on ACEI/ARB and Statin medications and is encouraged to initiate and continue to follow up with Ophthalmology, Dentist, Podiatrist at least yearly or according to recommendations, and advised to quit smoking. I have recommended yearly flu vaccine and pneumonia vaccine at least every 5 years; moderate intensity exercise for up to 150 minutes weekly; and sleep for at least 7 hours a day.  - she is advised to maintain close follow up with  Redmond School, MD for primary care needs (will be calling him about her fatigue to have iron checked), as well as her other providers for optimal and coordinated care (also reaching out to surgeon about the mass that has appeared.        I spent 36 minutes in the care of the patient today including review of labs from [place], Lipids, Thyroid Function, Hematology (current and previous including abstractions from other facilities); face-to-face time discussing  her blood glucose readings/logs, discussing hypoglycemia and hyperglycemia episodes and symptoms, medications doses, her options of short and long term treatment based on the latest standards of care / guidelines;  discussion about incorporating lifestyle medicine;  and documenting the encounter. Risk reduction counseling performed per USPSTF guidelines to reduce obesity and cardiovascular risk factors.     Please refer to Patient Instructions for Blood Glucose Monitoring and Insulin/Medications Dosing Guide"  in media tab for additional information. Please  also refer to " Patient Self Inventory" in the Media  tab for reviewed elements of pertinent patient history.  Shanon Ace participated in the discussions, expressed understanding, and voiced agreement with the above plans.  All questions were answered to her satisfaction. she is encouraged to contact clinic should she have any questions or concerns prior to her return visit.   Follow up plan: - Return in about 3 months (around 07/07/2022) for Diabetes F/U with A1c in office, No previsit labs.  Rayetta Pigg, [facility] [address] [place], [place] [address] Phone: [phone] Fax: [phone]  04/07/2022, 11:11 AM

## 2022-04-12 ENCOUNTER — Ambulatory Visit: Payer: Self-pay | Admitting: Surgery

## 2022-04-12 DIAGNOSIS — Z7901 Long term (current) use of anticoagulants: Secondary | ICD-10-CM | POA: Diagnosis not present

## 2022-04-12 DIAGNOSIS — Z720 Tobacco use: Secondary | ICD-10-CM | POA: Diagnosis not present

## 2022-04-12 DIAGNOSIS — Z85528 Personal history of other malignant neoplasm of kidney: Secondary | ICD-10-CM | POA: Diagnosis not present

## 2022-04-12 DIAGNOSIS — I739 Peripheral vascular disease, unspecified: Secondary | ICD-10-CM | POA: Diagnosis not present

## 2022-04-12 DIAGNOSIS — Z8673 Personal history of transient ischemic attack (TIA), and cerebral infarction without residual deficits: Secondary | ICD-10-CM | POA: Diagnosis not present

## 2022-04-12 DIAGNOSIS — F1190 Opioid use, unspecified, uncomplicated: Secondary | ICD-10-CM | POA: Diagnosis not present

## 2022-04-12 DIAGNOSIS — E119 Type 2 diabetes mellitus without complications: Secondary | ICD-10-CM | POA: Diagnosis not present

## 2022-04-12 DIAGNOSIS — K432 Incisional hernia without obstruction or gangrene: Secondary | ICD-10-CM | POA: Diagnosis not present

## 2022-04-12 DIAGNOSIS — Z955 Presence of coronary angioplasty implant and graft: Secondary | ICD-10-CM | POA: Diagnosis not present

## 2022-04-13 ENCOUNTER — Telehealth: Payer: Self-pay | Admitting: Cardiology

## 2022-04-13 NOTE — Telephone Encounter (Signed)
   Pre-operative Risk Assessment    Patient Name: Grace Hess  DOB: Nov 15, 1955 MRN: [id number]      Request for Surgical Clearance    Procedure:   Lap Ventral Wall Hernia Repair w/mesh  Date of Surgery:  Clearance TBD                                 Surgeon:  Michael Boston, M.D. Surgeon's Group or Practice Name:  [facility] Phone number:  [phone] leave message w/triage nurse Fax number:  [phone] attn: Illene Regulus, CMA   Type of Clearance Requested:   - Medical  - Pharmacy:  Hold Clopidogrel (Plavix) instructions on pt holding   Type of Anesthesia:  General    Additional requests/questions:    Signed, Hipolito Bayley   04/13/2022, 2:52 PM

## 2022-04-13 NOTE — Telephone Encounter (Signed)
Primary Cardiologist:Branch, Roderic Palau, MD  Chart reviewed as part of pre-operative protocol coverage. Because of Grace Hess's past medical history and time since last visit, he/she will require a follow-up visit in order to better assess preoperative cardiovascular risk.  Pre-op covering staff: - Patient has an appointment with Grace Furth, PA on 04/30/22 at which time clearance will be addressed.  - Please contact requesting surgeon's office via preferred method (i.e, phone, fax) to inform them of need for appointment prior to surgery.  From a cardiac perspective, pending no symptoms of ACS at the time of her assessment, she may hold Plavix for 5 days prior to procedure. However patient has a history of stroke, therefore additional clearance should be obtained from neurology.   Emmaline Life, NP-C  04/13/2022, 3:16 PM [address] Office [phone] Fax [phone]

## 2022-04-14 NOTE — Telephone Encounter (Signed)
Pt as a appointment 04/30/22, clearance will be addressed at that time.  Will route to the requesting surgeon's office to make them aware.

## 2022-04-21 DIAGNOSIS — M5416 Radiculopathy, lumbar region: Secondary | ICD-10-CM | POA: Diagnosis not present

## 2022-04-21 DIAGNOSIS — Z79891 Long term (current) use of opiate analgesic: Secondary | ICD-10-CM | POA: Diagnosis not present

## 2022-04-21 DIAGNOSIS — Z1330 Encounter for screening examination for mental health and behavioral disorders, unspecified: Secondary | ICD-10-CM | POA: Diagnosis not present

## 2022-04-30 ENCOUNTER — Ambulatory Visit: Payer: PPO | Attending: Medical | Admitting: Medical

## 2022-04-30 ENCOUNTER — Encounter: Payer: Self-pay | Admitting: Medical

## 2022-04-30 VITALS — BP 116/58 | HR 50 | Ht 66.0 in | Wt 139.0 lb

## 2022-04-30 DIAGNOSIS — I2510 Atherosclerotic heart disease of native coronary artery without angina pectoris: Secondary | ICD-10-CM | POA: Diagnosis not present

## 2022-04-30 DIAGNOSIS — R0602 Shortness of breath: Secondary | ICD-10-CM

## 2022-04-30 DIAGNOSIS — Z01818 Encounter for other preprocedural examination: Secondary | ICD-10-CM | POA: Diagnosis not present

## 2022-04-30 DIAGNOSIS — I739 Peripheral vascular disease, unspecified: Secondary | ICD-10-CM

## 2022-04-30 DIAGNOSIS — F17200 Nicotine dependence, unspecified, uncomplicated: Secondary | ICD-10-CM

## 2022-04-30 DIAGNOSIS — I6523 Occlusion and stenosis of bilateral carotid arteries: Secondary | ICD-10-CM | POA: Diagnosis not present

## 2022-04-30 MED ORDER — METOPROLOL TARTRATE 25 MG PO TABS: 12.5000 mg | ORAL_TABLET | Freq: Two times a day (BID) | ORAL | 3 refills | Status: DC

## 2022-04-30 NOTE — Patient Instructions (Signed)
Medication Instructions:  Your physician has recommended you make the following change in your medication:  Decrease Metoprolol Tartrate to 12.5 mg Two Times Daily   *If you need a refill on your cardiac medications before your next appointment, please call your pharmacy*   Lab Work: NONE   If you have labs (blood work) drawn today and your tests are completely normal, you will receive your results only by: [place] (if you have MyChart) OR A paper copy in the mail If you have any lab test that is abnormal or we need to change your treatment, we will call you to review the results.   Testing/Procedures: Your physician has requested that you have an echocardiogram. Echocardiography is a painless test that uses sound waves to create images of your heart. It provides your doctor with information about the size and shape of your heart and how well your heart's chambers and valves are working. This procedure takes approximately one hour. There are no restrictions for this procedure. Please do NOT wear cologne, perfume, aftershave, or lotions (deodorant is allowed). Please arrive 15 minutes prior to your appointment time.    Follow-Up: At [facility], you and your health needs are our priority.  As part of our continuing mission to provide you with exceptional heart care, we have created designated Provider Care Teams.  These Care Teams include your primary Cardiologist (physician) and Advanced Practice Providers (APPs -  Physician Assistants and Nurse Practitioners) who all work together to provide you with the care you need, when you need it.  We recommend signing up for the patient portal called "MyChart".  Sign up information is provided on this After Visit Summary.  MyChart is used to connect with patients for Virtual Visits (Telemedicine).  Patients are able to view lab/test results, encounter notes, upcoming appointments, etc.  Non-urgent messages can be sent to your  provider as well.   To learn more about what you can do with MyChart, go to [email or web address].    Your next appointment:   1 month(s)  Provider:   You may see Carlyle Dolly, MD or one of the following Advanced Practice Providers on your designated Care Team:   Bernerd Pho, PA-C  Ermalinda Barrios, [place]     Other Instructions Thank you for choosing [facility]!

## 2022-04-30 NOTE — Progress Notes (Signed)
Cardiology Office Note:    Date:  04/30/2022   ID:  Grace Hess, Grace Hess 1955-11-15, MRN [id number]  PCP:  Redmond School, MD  [facility] Cardiologist:  Carlyle Dolly, MD  [facility] Electrophysiologist:  None   Referring MD: Redmond School, MD   Chief Complaint: pre-op clearance  History of Present Illness:    Grace Hess is a 67 y.o. female with a hx of CAD s/p DES to RCA in 07/2016 with residual mLAD stenosis, PAD with known CTO tot he left SFA, carotid artery disease (s/p left CEA), HTN, HLD, DM2, CVA, tobacco use who presents for follow-up.  The patient was last seen via telehealth 11/05/21 for pre-op exam for left laparoscopic radical nephrectomy. Patient was deemed high risk for procedure.   Today, the patient is needing a lap ventral wall hernia repair. The patient does not have a date for the procedure yet.  The patient has a hernia on the left side of the abdomen. It was noted after the kidney surgery and was getting bigger. The hernia can cause pain. She denies chest pain. She has some SOB, uses O2 at night. She is a smoker. She is more short of breath when she walks or does something. She has occasional lower leg edema. O2 at home is generally normal.  She has occasional palpitations. Hr is 49 bpm on EKG today. She can do light work around the house. She can walk 1-2 blocks. She can go up a flight of stairs.   Past Medical History:  Diagnosis Date   Anxiety    Arthritis    "might have a touch in my fingers" (08/02/2016)   CAD (coronary artery disease)    a. s/p DES to RCA in 07/2016 with residual mid-LAD stenosis   Cancer ([place])    skin cancer on finger   Cervicalgia    Chest pain, unspecified    Depression    GERD (gastroesophageal reflux disease)    Headache    "bad ones after my stroke in 2016; only have them when I get bad news now" (08/02/2016)   Heart murmur    Heavy smoker (more than 20 cigarettes per day)    Scheduled for LDCT screening 02/11/15    History of hiatal hernia    History of kidney stones    "no cysto/OR" (12/10/2014)   Hypercholesteremia    Hypertension    Obesity    Reflux esophagitis    Stroke [facility])    "they said I had a silent stroke a few months back/MRI" (12/10/2014)   TIA (transient ischemic attack) 11/28/2014   [name] 11/28/2014   Type II diabetes mellitus ([place])     Past Surgical History:  Procedure Laterality Date   ABDOMINAL AORTOGRAM N/A 08/02/2016   Procedure: Abdominal Aortogram;  Surgeon: Nelva Bush, MD;  Location: [place] CV [facility];  Service: Cardiovascular;  Laterality: N/A;   CAROTID STENT     CHOLECYSTECTOMY OPEN  1978   COLONOSCOPY  2010   single diverticulum in sigmoid colon   CORONARY ANGIOPLASTY WITH STENT PLACEMENT  08/02/2016   to RCA   CORONARY STENT INTERVENTION N/A 08/02/2016   Procedure: Coronary Stent Intervention;  Surgeon: Nelva Bush, MD;  Location: [place] [facility];  Service: Cardiovascular;  Laterality: N/A;  RCA   CYSTOSCOPY W/ RETROGRADES Bilateral 07/15/2021   Procedure: CYSTOSCOPY WITH RETROGRADE PYELOGRAM;  Surgeon: Primus Bravo., MD;  Location: [facility];  Service: Urology;  Laterality: Bilateral;   [place]  Left 07/15/2021   Procedure: CYSTOSCOPY WITH STENT PLACEMENT;  Surgeon: Primus Bravo., MD;  Location: [facility];  Service: Urology;  Laterality: Left;   ENDARTERECTOMY Left 12/02/2014   Procedure: ENDARTERECTOMY CAROTID;  Surgeon: Rosetta Posner, MD;  Location: [place];  Service: Vascular;  Laterality: Left;   LAPAROSCOPIC NEPHRECTOMY Left    LAPAROSCOPIC NEPHRECTOMY Left 11/25/2021   Procedure: LEFT LAPAROSCOPIC RADICAL NEPHRECTOMY;  Surgeon: Ardis Hughs, MD;  Location: [facility];  Service: Urology;  Laterality: Left;   LEFT HEART CATH AND CORONARY ANGIOGRAPHY N/A 08/02/2016   Procedure: Left Heart Cath and Coronary Angiography;  Surgeon: Nelva Bush, MD;  Location: [place] CV [facility];  Service: Cardiovascular;   Laterality: N/A;   LOWER EXTREMITY ANGIOGRAM  08/02/2016   LOWER EXTREMITY ANGIOGRAPHY N/A 08/02/2016   Procedure: Lower Extremity Angiography;  Surgeon: Nelva Bush, MD;  Location: [place] [facility];  Service: Cardiovascular;  Laterality: N/A;   NEPHRECTOMY Left    VAGINAL HYSTERECTOMY  1991   "partial"    Current Medications: Current Meds  Medication Sig   acetaminophen (TYLENOL) 325 MG tablet Take 2 tablets (650 mg total) by mouth every 4 (four) hours as needed for headache or mild pain. (Patient taking differently: Take 500 mg by mouth every 4 (four) hours as needed for headache or mild pain.)   albuterol (VENTOLIN HFA) 108 (90 Base) MCG/ACT inhaler Inhale 1-2 puffs into the lungs every 4 (four) hours as needed for shortness of breath or wheezing.   ALPRAZolam (XANAX) 0.5 MG tablet Take 0.25 mg by mouth 2 (two) times daily.   amLODipine (NORVASC) 5 MG tablet Take 1 tablet (5 mg total) by mouth daily.   benzonatate (TESSALON) 100 MG capsule Take 100 mg by mouth 4 (four) times daily as needed for cough.   Biotin 5000 MCG CAPS Take 5,000 mcg by mouth daily.   Blood Glucose Monitoring Suppl (ACCU-CHEK GUIDE) w/Device KIT 1 Piece by Does not apply route as directed.   Cholecalciferol (VITAMIN D3) 125 MCG (5000 UT) CAPS Take 5,000 Units by mouth once a week. Tuesday   clopidogrel (PLAVIX) 75 MG tablet Take 1 tablet (75 mg total) by mouth daily.   escitalopram (LEXAPRO) 20 MG tablet Take 20 mg by mouth daily.   gabapentin (NEURONTIN) 300 MG capsule Take 300 mg by mouth at bedtime.   glucose blood (ACCU-CHEK GUIDE) test strip Use as instructed   metoprolol tartrate (LOPRESSOR) 25 MG tablet Take 0.5 tablets (12.5 mg total) by mouth 2 (two) times daily.   nystatin cream (MYCOSTATIN) Apply 1 application  topically 2 (two) times daily as needed (Itching).   ondansetron (ZOFRAN) 4 MG tablet TAKE 1 TABLET(4 MG) BY MOUTH TWICE DAILY AS NEEDED FOR NAUSEA OR VOMITING   oxyCODONE-acetaminophen  (PERCOCET/ROXICET) 5-325 MG tablet Take 1 tablet by mouth 3 (three) times daily as needed.   pantoprazole (PROTONIX) 40 MG tablet TAKE 1 TABLET(40 MG) BY MOUTH DAILY BEFORE BREAKFAST   promethazine (PHENERGAN) 25 MG tablet Take 25 mg by mouth 4 (four) times daily as needed.   rosuvastatin (CRESTOR) 20 MG tablet TAKE 1 TABLET(20 MG) BY MOUTH DAILY   [DISCONTINUED] metoprolol tartrate (LOPRESSOR) 25 MG tablet Take 1 tablet (25 mg total) by mouth 2 (two) times daily. Please keep scheduled appointment for future refills. Thank you.     Allergies:   Codeine, Latex, and Sulfa antibiotics   Social History   Socioeconomic History   Marital status: Married    Spouse name: Not on  file   Number of children: 2   Years of education: 12   Highest education level: Not on file  Occupational History   Not on file  Tobacco Use   Smoking status: Every Day    Packs/day: 2.00    Years: 43.00    Total pack years: 86.00    Types: Cigarettes    Start date: 05/17/1974   Smokeless tobacco: Never  Vaping Use   Vaping Use: Never used  Substance and Sexual Activity   Alcohol use: No    Alcohol/week: 0.0 standard drinks of alcohol   Drug use: No   Sexual activity: Not Currently  Other Topics Concern   Not on file  Social History Narrative   Patient does not drink caffeine.   Patient is right handed.    Social Determinants of Health   Financial Resource Strain: Not on file  Food Insecurity: No Food Insecurity (11/25/2021)   Hunger Vital Sign    Worried About Running Out of Food in the Last Year: Never true    Ran Out of Food in the Last Year: Never true  Transportation Needs: No Transportation Needs (11/25/2021)   PRAPARE - Hydrologist (Medical): No    Lack of Transportation (Non-Medical): No  Physical Activity: Not on file  Stress: Not on file  Social Connections: Not on file     Family History: The patient's family history includes COPD in her father; Cancer in  her brother and brother; Congestive Heart Failure in her mother.  ROS:   Please see the history of present illness.     All other systems reviewed and are negative.  EKGs/Labs/Other Studies Reviewed:    The following studies were reviewed today:  Vascular Carotid US 04/2021 Summary:  Right Carotid: Velocities in the right ICA are consistent with a 60-79%                 stenosis. The ECA appears >50% stenosed.   Left Carotid: Velocities in the left ICA are consistent with a 1-39%  stenosis.   Vertebrals: Bilateral vertebral arteries demonstrate antegrade flow.  Subclavians: Bilateral subclavian artery flow was disturbed.   *See table(s) above for measurements and observations.  Suggest follow up study in 12 months.    Electronically signed by Kathlyn Sacramento MD on 04/17/2021 at 6:12:12 PM.        Final     Echo 12/2018  1. Left ventricular ejection fraction, by visual estimation, is 65 to  70%. The left ventricle has normal function. There is mildly increased  left ventricular hypertrophy.   2. Global right ventricle has normal systolic function.The right  ventricular size is normal. No increase in right ventricular wall  thickness.   3. Left atrial size was normal.   4. Right atrial size was normal.   5. Presence of pericardial fat pad.   6. The pericardial effusion is circumferential.   7. Trivial pericardial effusion is present.   8. Mild aortic valve annular calcification.   9. The mitral valve is normal in structure. No evidence of mitral valve  regurgitation. No evidence of mitral stenosis.  10. The tricuspid valve is normal in structure. Tricuspid valve  regurgitation is not demonstrated.  11. The aortic valve has an indeterminant number of cusps. Aortic valve  regurgitation is not visualized. Mild aortic valve sclerosis without  stenosis.  12. There is Mild calcification of the aortic valve.  13. There is Mild thickening of the aortic  valve.  14. The pulmonic  valve was not well visualized. Pulmonic valve  regurgitation is not visualized.   EKG:  EKG is ordered today.  The ekg ordered today demonstrates SB 49bpm, low voltage, poor R wave progression, nonspecific T wave changes  Recent Labs: 07/14/2021: Magnesium 2.0 11/28/2021: B Natriuretic Peptide 578.0 03/31/2022: ALT 15; BUN 64; Creatinine, Ser 3.71; Hemoglobin 10.4; Platelets 224; Potassium 5.7; Sodium 132  Recent Lipid Panel    Component Value Date/Time   CHOL 161 03/30/2021 0803   TRIG 212 (H) 03/30/2021 0803   HDL 35 (L) 03/30/2021 0803   CHOLHDL 4.6 (H) 03/30/2021 0803   CHOLHDL 3.6 01/16/2019 0937   VLDL UNABLE TO CALCULATE IF TRIGLYCERIDE OVER 400 mg/dL 11/29/2014 0630   LDLCALC 90 03/30/2021 0803   LDLCALC 69 01/16/2019 0937     Physical Exam:    VS:  BP (!) 116/58   Pulse (!) 50   Ht 5' 6"$  (1.676 m)   Wt 139 lb (63 kg)   SpO2 96%   BMI 22.44 kg/m     Wt Readings from Last 3 Encounters:  04/30/22 139 lb (63 kg)  04/07/22 137 lb (62.1 kg)  04/07/22 137 lb 6.4 oz (62.3 kg)     GEN:  Well nourished, well developed in no acute distress HEENT: Normal NECK: No JVD; No carotid bruits LYMPHATICS: No lymphadenopathy CARDIAC: RR, bradycardia, no murmurs, rubs, gallops RESPIRATORY:  Clear to auscultation without rales, wheezing or rhonchi  ABDOMEN: Soft, non-tender, non-distended MUSCULOSKELETAL:  No edema; No deformity  SKIN: Warm and dry NEUROLOGIC:  Alert and oriented x 3 PSYCHIATRIC:  Normal affect   ASSESSMENT:    1. Pre-op evaluation   2. SOB (shortness of breath)   3. Coronary artery disease involving native coronary artery of native heart without angina pectoris   4. Bilateral carotid artery stenosis   5. PAD (peripheral artery disease) ([place])   6. Current smoker    PLAN:    In order of problems listed above:  Pre-op evaluation for hernia repair CAD with prior stenting in 2018 DOE COPD/tobacco use Patient is needing a hernia repair, no date has been  set yet. She reports worsening DOE and is needing O2 more at night. Also with COPD and she is a smoker. Murmur on exam as well. No lower leg edema and lungs are clear. I will check an echocardiogram. The patient denies chest pain. HR on EKG is 49bpm. The patient does report some fatigue. I will decrease metoprolol to 12.51m BID. METS>4. Ok to hold plavix 5 days prior to surgery. According to the RCRI the patient is class IV risk at 15% 30 day risk of death, MI or cardiac arrest. If echo is stable, Ok to pursue surgery.   Disposition: Follow up in 1 month(s) with MD/APP    Signed, Monna Crean HNinfa Meeker PA-C  04/30/2022 3:32 PM    [facility]

## 2022-05-02 DIAGNOSIS — C642 Malignant neoplasm of left kidney, except renal pelvis: Secondary | ICD-10-CM | POA: Diagnosis not present

## 2022-05-02 DIAGNOSIS — C649 Malignant neoplasm of unspecified kidney, except renal pelvis: Secondary | ICD-10-CM | POA: Diagnosis not present

## 2022-05-06 ENCOUNTER — Ambulatory Visit: Payer: PPO | Attending: Student

## 2022-05-06 DIAGNOSIS — E1122 Type 2 diabetes mellitus with diabetic chronic kidney disease: Secondary | ICD-10-CM | POA: Diagnosis not present

## 2022-05-06 DIAGNOSIS — R809 Proteinuria, unspecified: Secondary | ICD-10-CM | POA: Diagnosis not present

## 2022-05-06 DIAGNOSIS — N2889 Other specified disorders of kidney and ureter: Secondary | ICD-10-CM | POA: Diagnosis not present

## 2022-05-06 DIAGNOSIS — Z905 Acquired absence of kidney: Secondary | ICD-10-CM | POA: Diagnosis not present

## 2022-05-06 DIAGNOSIS — E1129 Type 2 diabetes mellitus with other diabetic kidney complication: Secondary | ICD-10-CM | POA: Diagnosis not present

## 2022-05-06 DIAGNOSIS — I6523 Occlusion and stenosis of bilateral carotid arteries: Secondary | ICD-10-CM | POA: Diagnosis not present

## 2022-05-06 DIAGNOSIS — E871 Hypo-osmolality and hyponatremia: Secondary | ICD-10-CM | POA: Diagnosis not present

## 2022-05-06 DIAGNOSIS — I9589 Other hypotension: Secondary | ICD-10-CM | POA: Diagnosis not present

## 2022-05-06 DIAGNOSIS — N189 Chronic kidney disease, unspecified: Secondary | ICD-10-CM | POA: Diagnosis not present

## 2022-05-06 DIAGNOSIS — E875 Hyperkalemia: Secondary | ICD-10-CM | POA: Diagnosis not present

## 2022-05-06 DIAGNOSIS — D638 Anemia in other chronic diseases classified elsewhere: Secondary | ICD-10-CM | POA: Diagnosis not present

## 2022-05-11 ENCOUNTER — Other Ambulatory Visit: Payer: Self-pay | Admitting: Cardiology

## 2022-05-18 DIAGNOSIS — M5416 Radiculopathy, lumbar region: Secondary | ICD-10-CM | POA: Diagnosis not present

## 2022-05-18 DIAGNOSIS — F1120 Opioid dependence, uncomplicated: Secondary | ICD-10-CM | POA: Diagnosis not present

## 2022-05-21 ENCOUNTER — Ambulatory Visit
Admission: RE | Admit: 2022-05-21 | Discharge: 2022-05-21 | Disposition: A | Discharge status: Home / Self Care | Payer: PPO | Source: Ambulatory Visit | Attending: Internal Medicine | Admitting: Internal Medicine

## 2022-05-21 DIAGNOSIS — Z1231 Encounter for screening mammogram for malignant neoplasm of breast: Secondary | ICD-10-CM

## 2022-05-25 DIAGNOSIS — N185 Chronic kidney disease, stage 5: Secondary | ICD-10-CM | POA: Diagnosis not present

## 2022-05-26 ENCOUNTER — Other Ambulatory Visit: Payer: Self-pay | Admitting: Internal Medicine

## 2022-05-26 DIAGNOSIS — R928 Other abnormal and inconclusive findings on diagnostic imaging of breast: Secondary | ICD-10-CM

## 2022-05-31 ENCOUNTER — Other Ambulatory Visit: Payer: Self-pay | Admitting: Student

## 2022-05-31 DIAGNOSIS — I6523 Occlusion and stenosis of bilateral carotid arteries: Secondary | ICD-10-CM

## 2022-06-02 ENCOUNTER — Ambulatory Visit (HOSPITAL_COMMUNITY)
Admission: RE | Admit: 2022-06-02 | Discharge: 2022-06-02 | Disposition: A | Discharge status: Home / Self Care | Payer: PPO | Source: Ambulatory Visit | Attending: Medical | Admitting: Medical

## 2022-06-02 DIAGNOSIS — R0602 Shortness of breath: Secondary | ICD-10-CM

## 2022-06-02 LAB — ECHOCARDIOGRAM COMPLETE
AR max vel: 1.54 cm2
AV Area VTI: 1.45 cm2
AV Area mean vel: 1.66 cm2
AV Mean grad: 8 mmHg
AV Peak grad: 17 mmHg
Ao pk vel: 2.06 m/s
Area-P 1/2: 3.42 cm2
S' Lateral: 2.3 cm

## 2022-06-02 NOTE — Progress Notes (Signed)
*  PRELIMINARY RESULTS* Echocardiogram 2D Echocardiogram has been performed.  Grace Hess 06/02/2022, 3:55 PM

## 2022-06-08 ENCOUNTER — Encounter: Payer: Self-pay | Admitting: Student

## 2022-06-08 ENCOUNTER — Ambulatory Visit: Payer: PPO | Attending: Student | Admitting: Student

## 2022-06-08 VITALS — BP 108/54 | HR 56 | Ht 66.0 in | Wt 136.8 lb

## 2022-06-08 DIAGNOSIS — N189 Chronic kidney disease, unspecified: Secondary | ICD-10-CM | POA: Diagnosis not present

## 2022-06-08 DIAGNOSIS — I6523 Occlusion and stenosis of bilateral carotid arteries: Secondary | ICD-10-CM

## 2022-06-08 DIAGNOSIS — I10 Essential (primary) hypertension: Secondary | ICD-10-CM

## 2022-06-08 DIAGNOSIS — E1122 Type 2 diabetes mellitus with diabetic chronic kidney disease: Secondary | ICD-10-CM | POA: Diagnosis not present

## 2022-06-08 DIAGNOSIS — E875 Hyperkalemia: Secondary | ICD-10-CM | POA: Diagnosis not present

## 2022-06-08 DIAGNOSIS — I2510 Atherosclerotic heart disease of native coronary artery without angina pectoris: Secondary | ICD-10-CM | POA: Diagnosis not present

## 2022-06-08 DIAGNOSIS — R001 Bradycardia, unspecified: Secondary | ICD-10-CM

## 2022-06-08 DIAGNOSIS — E1129 Type 2 diabetes mellitus with other diabetic kidney complication: Secondary | ICD-10-CM | POA: Diagnosis not present

## 2022-06-08 DIAGNOSIS — D638 Anemia in other chronic diseases classified elsewhere: Secondary | ICD-10-CM | POA: Diagnosis not present

## 2022-06-08 DIAGNOSIS — R809 Proteinuria, unspecified: Secondary | ICD-10-CM | POA: Diagnosis not present

## 2022-06-08 DIAGNOSIS — Z01810 Encounter for preprocedural cardiovascular examination: Secondary | ICD-10-CM

## 2022-06-08 DIAGNOSIS — E871 Hypo-osmolality and hyponatremia: Secondary | ICD-10-CM | POA: Diagnosis not present

## 2022-06-08 DIAGNOSIS — E559 Vitamin D deficiency, unspecified: Secondary | ICD-10-CM | POA: Diagnosis not present

## 2022-06-08 DIAGNOSIS — N2889 Other specified disorders of kidney and ureter: Secondary | ICD-10-CM | POA: Diagnosis not present

## 2022-06-08 MED ORDER — METOPROLOL TARTRATE 25 MG PO TABS: 12.5000 mg | ORAL_TABLET | Freq: Two times a day (BID) | ORAL | 3 refills | Status: DC

## 2022-06-08 NOTE — Progress Notes (Addendum)
Cardiology Office Note:    Date:  06/08/2022  ID:  Grace Hess, DOB 11-18-1955, MRN [id number]  History of Present Illness:    Grace Hess is a 67 y.o. female with past medical history of CAD (s/p DES to RCA in 07/2016 with residual mid-LAD stenosis), PAD (known CTO of left SFA), carotid artery stenosis (s/p L CEA), HTN, HLD, Type 2 DM and tobacco use who presents to the office today for 1 month follow-up.  She was examined by Cadence Furth, PA-C on 04/30/2022 for cardiac clearance in regards to upcoming ventral wall hernia repair. She reported intermittent dyspnea but denied any recent chest pain. She was noted to be bradycardic with heart rate in the 40s, therefore Metoprolol was reduced to 12.5 mg twice daily. Was recommended to obtain an echocardiogram given her murmur on examination and if reassuring, would be cleared for surgery. Her echocardiogram was performed on 06/02/2022 and while it is listed as not being finalized, the scanned report is available and shows a preserved ejection fraction of 60 to 65% with no regional motion abnormalities. RV function was normal as well and she did have moderate mitral annular calcification and mild calcification of the aortic valve but no significant stenosis or regurgitation.  In talking with the patient and her husband today, she reports having baseline dyspnea but no acute changes in this. She does utilize inhalers with improvement in her symptoms. No recent exertional chest pain or palpitations. No specific orthopnea, PND or pitting edema. She is unaware of any change in symptoms since Lopressor was reduced from 25 mg twice daily to 12.5 mg twice daily at the time of her last visit.  Studies Reviewed:    EKG: EKG from 04/30/2022 is reviewed and shows sinus bradycardia, HR 49 with anterior infarct pattern.   Echocardiogram: 06/02/2022    Physical Exam:   VS:  BP (!) 108/54   Pulse (!) 56   Ht 5\' 6"  (1.676 m)   Wt 136 lb 12.8 oz (62.1 kg)    SpO2 93%   BMI 22.08 kg/m    Wt Readings from Last 3 Encounters:  06/08/22 136 lb 12.8 oz (62.1 kg)  04/30/22 139 lb (63 kg)  04/07/22 137 lb (62.1 kg)     GEN: Well nourished, well developed female appearing in no acute distress NECK: No JVD; No carotid bruits CARDIAC: RRR, no murmurs, rubs, gallops RESPIRATORY:  Clear to auscultation without rales, wheezing or rhonchi  ABDOMEN: Hernia noted. Does not appear distended.  EXTREMITIES:  No edema; No deformity   ASSESSMENT AND PLAN:    1. Preoperative Cardiac Clearance for Hernia Repair - She is able to perform 4 METS of activity without anginal symptoms and RCRI risk is at 6.6% risk of a major cardiac event. Recent echocardiogram was reassuring as outlined above. At this time, she is of acceptable risk to proceed without the need for further cardiac testing. Will forward today's note to the requesting provider's office (Dr. Johney Maine -  [phone] Attn: Illene Regulus, CMA). She can hold Plavix for 5 days prior to surgery as previously discussed given no recent intervention.   2. CAD - She is s/p DES to RCA in 07/2016 with residual mid-LAD stenosis. She has baseline dyspnea on exertion but no chest pain.  - Continue current medical therapy with Plavix 75 mg daily and Crestor 20 mg daily. Given her fatigue and bradycardia, I did recommend that she hold Lopressor 12.5 mg twice daily for 2 weeks to see  if her heart rate and fatigue improve. If no change in symptoms or heart rate, can resume.  3. Carotid Artery Stenosis  - She is s/p L CEA and dopplers in 04/2022 showed 60-79% RICA stenosis and 123456 LICA stenosis. Followed by Vascular. Remains on Plavix and statin therapy.   4. Bradycardia - HR was in the 40's at the time of her last visit with Lopressor being reduced to 12.5 mg twice daily. Heart rate is in the 50's today. I did recommend that she take a "drug holiday" from Metoprolol for a few weeks to see if her heart rate and fatigue  improve. If no change, can resume at 12.5 mg twice daily.  5. HTN - BP is well-controlled at 108/54 during today's visit. Continue Amlodipine 5 mg daily and will have her hold Lopressor as outlined above. She will keep a HR/BP log.    Signed, Erma Heritage, PA-C

## 2022-06-08 NOTE — Patient Instructions (Signed)
Medication Instructions:  HOLD Lopressor for 2 weeks, call us with BP/heart rate readings  Labwork: None today  Testing/Procedures: None today  Follow-Up: 6 months  Any Other Special Instructions Will Be Listed Below (If Applicable).  If you need a refill on your cardiac medications before your next appointment, please call your pharmacy.

## 2022-06-11 ENCOUNTER — Ambulatory Visit
Admission: RE | Admit: 2022-06-11 | Discharge: 2022-06-11 | Disposition: A | Discharge status: Home / Self Care | Payer: PPO | Source: Ambulatory Visit | Attending: Internal Medicine | Admitting: Internal Medicine

## 2022-06-11 ENCOUNTER — Ambulatory Visit: Payer: PPO

## 2022-06-11 DIAGNOSIS — R928 Other abnormal and inconclusive findings on diagnostic imaging of breast: Secondary | ICD-10-CM

## 2022-06-14 DIAGNOSIS — N189 Chronic kidney disease, unspecified: Secondary | ICD-10-CM | POA: Diagnosis not present

## 2022-06-14 DIAGNOSIS — R809 Proteinuria, unspecified: Secondary | ICD-10-CM | POA: Diagnosis not present

## 2022-06-14 DIAGNOSIS — Z905 Acquired absence of kidney: Secondary | ICD-10-CM | POA: Diagnosis not present

## 2022-06-14 DIAGNOSIS — E211 Secondary hyperparathyroidism, not elsewhere classified: Secondary | ICD-10-CM | POA: Diagnosis not present

## 2022-06-14 DIAGNOSIS — E875 Hyperkalemia: Secondary | ICD-10-CM | POA: Diagnosis not present

## 2022-06-14 DIAGNOSIS — N185 Chronic kidney disease, stage 5: Secondary | ICD-10-CM | POA: Diagnosis not present

## 2022-06-14 DIAGNOSIS — R031 Nonspecific low blood-pressure reading: Secondary | ICD-10-CM | POA: Diagnosis not present

## 2022-06-14 DIAGNOSIS — D631 Anemia in chronic kidney disease: Secondary | ICD-10-CM | POA: Diagnosis not present

## 2022-06-14 DIAGNOSIS — E1129 Type 2 diabetes mellitus with other diabetic kidney complication: Secondary | ICD-10-CM | POA: Diagnosis not present

## 2022-06-14 DIAGNOSIS — E8722 Chronic metabolic acidosis: Secondary | ICD-10-CM | POA: Diagnosis not present

## 2022-06-24 DIAGNOSIS — N185 Chronic kidney disease, stage 5: Secondary | ICD-10-CM | POA: Diagnosis not present

## 2022-07-05 ENCOUNTER — Ambulatory Visit (HOSPITAL_COMMUNITY)
Admission: RE | Admit: 2022-07-05 | Discharge: 2022-07-05 | Disposition: A | Discharge status: Home / Self Care | Payer: PPO | Source: Ambulatory Visit | Attending: Hematology | Admitting: Hematology

## 2022-07-05 ENCOUNTER — Inpatient Hospital Stay: Payer: PPO | Attending: Hematology

## 2022-07-05 DIAGNOSIS — Z08 Encounter for follow-up examination after completed treatment for malignant neoplasm: Secondary | ICD-10-CM | POA: Insufficient documentation

## 2022-07-05 DIAGNOSIS — N189 Chronic kidney disease, unspecified: Secondary | ICD-10-CM | POA: Insufficient documentation

## 2022-07-05 DIAGNOSIS — C642 Malignant neoplasm of left kidney, except renal pelvis: Secondary | ICD-10-CM | POA: Insufficient documentation

## 2022-07-05 DIAGNOSIS — F17210 Nicotine dependence, cigarettes, uncomplicated: Secondary | ICD-10-CM | POA: Diagnosis not present

## 2022-07-05 DIAGNOSIS — D631 Anemia in chronic kidney disease: Secondary | ICD-10-CM | POA: Diagnosis not present

## 2022-07-05 DIAGNOSIS — Z85528 Personal history of other malignant neoplasm of kidney: Secondary | ICD-10-CM | POA: Diagnosis not present

## 2022-07-05 DIAGNOSIS — N200 Calculus of kidney: Secondary | ICD-10-CM | POA: Diagnosis not present

## 2022-07-05 DIAGNOSIS — E875 Hyperkalemia: Secondary | ICD-10-CM

## 2022-07-05 DIAGNOSIS — R911 Solitary pulmonary nodule: Secondary | ICD-10-CM | POA: Diagnosis not present

## 2022-07-05 LAB — FERRITIN: Ferritin: 32 ng/mL (ref 11–307)

## 2022-07-05 LAB — MAGNESIUM: Magnesium: 1.6 mg/dL — ABNORMAL LOW (ref 1.7–2.4)

## 2022-07-05 LAB — CBC WITH DIFFERENTIAL/PLATELET
Abs Immature Granulocytes: 0.03 10*3/uL (ref 0.00–0.07)
Basophils Absolute: 0.1 10*3/uL (ref 0.0–0.1)
Basophils Relative: 1 %
Eosinophils Absolute: 0.4 10*3/uL (ref 0.0–0.5)
Eosinophils Relative: 5 %
HCT: 36 % (ref 36.0–46.0)
Hemoglobin: 11.5 g/dL — ABNORMAL LOW (ref 12.0–15.0)
Immature Granulocytes: 0 %
Lymphocytes Relative: 26 %
Lymphs Abs: 2.3 10*3/uL (ref 0.7–4.0)
MCH: 29.4 pg (ref 26.0–34.0)
MCHC: 31.9 g/dL (ref 30.0–36.0)
MCV: 92.1 fL (ref 80.0–100.0)
Monocytes Absolute: 0.8 10*3/uL (ref 0.1–1.0)
Monocytes Relative: 9 %
Neutro Abs: 5.2 10*3/uL (ref 1.7–7.7)
Neutrophils Relative %: 59 %
Platelets: 232 10*3/uL (ref 150–400)
RBC: 3.91 MIL/uL (ref 3.87–5.11)
RDW: 15.1 % (ref 11.5–15.5)
WBC: 8.8 10*3/uL (ref 4.0–10.5)
nRBC: 0 % (ref 0.0–0.2)

## 2022-07-05 LAB — COMPREHENSIVE METABOLIC PANEL
ALT: 12 U/L (ref 0–44)
AST: 13 U/L — ABNORMAL LOW (ref 15–41)
Albumin: 3.9 g/dL (ref 3.5–5.0)
Alkaline Phosphatase: 60 U/L (ref 38–126)
Anion gap: 11 (ref 5–15)
BUN: 66 mg/dL — ABNORMAL HIGH (ref 8–23)
CO2: 21 mmol/L — ABNORMAL LOW (ref 22–32)
Calcium: 9.2 mg/dL (ref 8.9–10.3)
Chloride: 105 mmol/L (ref 98–111)
Creatinine, Ser: 3.46 mg/dL — ABNORMAL HIGH (ref 0.44–1.00)
GFR, Estimated: 14 mL/min — ABNORMAL LOW (ref >60)
Glucose, Bld: 163 mg/dL — ABNORMAL HIGH (ref 70–99)
Potassium: 5.5 mmol/L — ABNORMAL HIGH (ref 3.5–5.1)
Sodium: 137 mmol/L (ref 135–145)
Total Bilirubin: 0.8 mg/dL (ref 0.3–1.2)
Total Protein: 7.4 g/dL (ref 6.5–8.1)

## 2022-07-05 LAB — IRON AND TIBC
Iron: 60 ug/dL (ref 28–170)
Saturation Ratios: 18 % (ref 10.4–31.8)
TIBC: 337 ug/dL (ref 250–450)
UIBC: 277 ug/dL

## 2022-07-08 ENCOUNTER — Ambulatory Visit: Payer: HMO | Admitting: Nurse Practitioner

## 2022-07-08 DIAGNOSIS — N184 Chronic kidney disease, stage 4 (severe): Secondary | ICD-10-CM | POA: Diagnosis not present

## 2022-07-08 DIAGNOSIS — K219 Gastro-esophageal reflux disease without esophagitis: Secondary | ICD-10-CM | POA: Diagnosis not present

## 2022-07-08 DIAGNOSIS — Z6821 Body mass index (BMI) 21.0-21.9, adult: Secondary | ICD-10-CM | POA: Diagnosis not present

## 2022-07-08 DIAGNOSIS — I739 Peripheral vascular disease, unspecified: Secondary | ICD-10-CM | POA: Diagnosis not present

## 2022-07-08 DIAGNOSIS — F411 Generalized anxiety disorder: Secondary | ICD-10-CM | POA: Diagnosis not present

## 2022-07-08 DIAGNOSIS — E1165 Type 2 diabetes mellitus with hyperglycemia: Secondary | ICD-10-CM | POA: Diagnosis not present

## 2022-07-08 DIAGNOSIS — I10 Essential (primary) hypertension: Secondary | ICD-10-CM | POA: Diagnosis not present

## 2022-07-11 NOTE — Progress Notes (Signed)
[address] [facility] [address], [place] [address]    Clinic Day:  07/12/2022  Referring physician: Elfredia Nevins, MD  Patient Care Team: Elfredia Nevins, MD as PCP - General (Internal Medicine) Wyline Mood Dorothe Pea, MD as PCP - Cardiology (Cardiology) Doreatha Massed, MD as Medical Oncologist (Medical Oncology) Crist Fat, MD as Attending Physician (Urology)   ASSESSMENT & PLAN:   Assessment: 1. 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  Moderate to marked severity left-sided hydronephrosis.  Numerous bilateral nonobstructing renal calculi. - MRI abdomen (08/14/2021): Central lower pole left kidney 4 x 3.7 x 5.3 cm enhancing mass, extension into the proximal left ureter.  No renal vein involvement or evidence of abdominal metastatic disease.  Multiple small abdominal retroperitoneal lymph nodes, nonpathologic by size criteria.  Right abdominal wall subcutaneous nodule nonspecific but new since 07/13/2021. - Left kidney biopsy (09/10/2021): Clear-cell renal cell carcinoma, nuclear grade 2.  No tumor necrosis. - Left nephrectomy on 11/25/2021: Pathology 3.2 cm clear-cell renal cell carcinoma, grade 4, negative sarcoid features, focal rhabdoid features, tumor necrosis negative.  PT1APN0.   2. Social/family history: - She lives at home with her husband.  She retired after working in [employer].  She is current active smoker, 2 packs/day for 48 years. - Brother had throat cancer and was smoker.  Maternal uncle died of cancer.  Paternal uncle also had cancer.  Maternal grandmother had lung cancer.    Plan: 1. Stage I (T1 aN0 M0) left kidney clear-cell renal cell carcinoma, grade 4: - She does not have any new onset pains.  No hematuria. - CT CAP from  07/05/2022: Stable post left nephrectomy without evidence of recurrence.  Other benign findings were discussed. - She is planning to have hernia surgery on 08/05/2022. - I have recommended follow-up in 3 months with repeat labs.  Will consider repeating CT scan in 6 months.   2.  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 - She is taking iron tablet 2-3 times per week. - Ferritin is 32 and percent saturation of 18.  I have recommended parenteral iron therapy.  She said she would try taking iron tablet daily again.  We will reevaluate in 3 months.   3.  Hyperkalemia: - Potassium has been consistently high for the last few times.  Dr. Wolfgang Phoenix has prescribed her [facility].  However patient cannot afford the co-pay.  She will follow-up with Dr. Wolfgang Phoenix for alternatives.    Orders Placed This Encounter  Procedures   CBC    Standing Status:   Future    Standing Expiration Date:   07/12/2023   Ferritin    Standing Status:   Future    Standing Expiration Date:   07/12/2023    Order Specific Question:   Release to patient    Answer:   Immediate   Iron and TIBC    Standing Status:   Future    Standing Expiration Date:   07/12/2023    Order Specific Question:   Release to patient    Answer:   Immediate      I,Katie Daubenspeck,acting as a scribe for Doreatha Massed, MD.,have documented all relevant documentation on the behalf of Doreatha Massed, MD,as directed by  Doreatha Massed, MD while in  the presence of Doreatha Massed, MD.   I, Doreatha Massed MD, have reviewed the above documentation for accuracy and completeness, and I agree with the above.   Doreatha Massed, MD   4/29/20244:23 PM  CHIEF COMPLAINT:   Diagnosis: left kidney clear cell renal cell carcinoma    Cancer Staging  Renal cell cancer, left [facility]) Staging form: Kidney, AJCC 8th Edition - Clinical stage from 10/01/2021: Stage I (cT1a, cN0, cM0) - Signed by  Doreatha Massed, MD on 12/22/2021    Prior Therapy: Left radical nephrectomy on 11/25/2021   Current Therapy:  Surveillance    HISTORY OF PRESENT ILLNESS:   Oncology History  Renal cell cancer, left (HCC)  10/01/2021 Initial Diagnosis   Renal cell cancer, left (HCC)   10/01/2021 Cancer Staging   Staging form: Kidney, AJCC 8th Edition - Clinical stage from 10/01/2021: Stage I (cT1a, cN0, cM0) - Signed by Doreatha Massed, MD on 12/22/2021 Histopathologic type: Clear cell adenocarcinoma, NOS Stage prefix: Initial diagnosis Histologic grade (G): G4 Histologic grading system: 4 grade system Histologic sub-type: Clear cell Sarcomatoid features: Absent Rhabdoid features: Present Tumor necrosis: Absent      INTERVAL HISTORY:   Grace Hess is a 67 y.o. female presenting to clinic today for follow up of left kidney clear cell renal cell carcinoma. She was last seen by me on 04/07/22.  Since her last visit, she underwent surveillance CT C/A/P on 07/05/22 showing: stable exam without evidence of local recurrence or metastatic disease.  Of note, she is scheduled for hernia repair on 08/05/22 under Dr. Michaell Cowing.  Today, she states that she is doing well overall. Her appetite level is at 70%. Her energy level is at 60%.  PAST MEDICAL HISTORY:   Past Medical History: Past Medical History:  Diagnosis Date   Anxiety    Arthritis    "might have a touch in my fingers" (08/02/2016)   CAD (coronary artery disease)    a. s/p DES to RCA in 07/2016 with residual mid-LAD stenosis   Cancer (HCC)    skin cancer on finger   Cervicalgia    Chest pain, unspecified    Depression    GERD (gastroesophageal reflux disease)    Headache    "bad ones after my stroke in 2016; only have them when I get bad news now" (08/02/2016)   Heart murmur    Heavy smoker (more than 20 cigarettes per day)    Scheduled for LDCT screening 02/11/15   History of hiatal hernia    History of kidney stones    "no  cysto/OR" (12/10/2014)   Hypercholesteremia    Hypertension    Obesity    Reflux esophagitis    Stroke [facility])    "they said I had a silent stroke a few months back/MRI" (12/10/2014)   TIA (transient ischemic attack) 11/28/2014   [name] 11/28/2014   Type II diabetes mellitus (HCC)     Surgical History: Past Surgical History:  Procedure Laterality Date   ABDOMINAL AORTOGRAM N/A 08/02/2016   Procedure: Abdominal Aortogram;  Surgeon: Yvonne Kendall, MD;  Location: [facility] INVASIVE CV LAB;  Service: Cardiovascular;  Laterality: N/A;   CAROTID STENT     CHOLECYSTECTOMY OPEN  1978   COLONOSCOPY  2010   single diverticulum in sigmoid colon   CORONARY ANGIOPLASTY WITH STENT PLACEMENT  08/02/2016   to RCA   CORONARY STENT INTERVENTION N/A 08/02/2016   Procedure: Coronary Stent Intervention;  Surgeon: Yvonne Kendall, MD;  Location: [facility] INVAS[facility] CV LAB;  Service: Cardiovascular;  Laterality: N/A;  RCA   CYSTOSCOPY W/ RETROGRADES Bilateral 07/15/2021   Procedure: CYSTOSCOPY WITH RETROGRADE PYELOGRAM;  Surgeon: Milderd Meager., MD;  Location: [facility] ORS;  Service: Urology;  Laterality: Bilateral;   CYSTOSCOPY WITH STENT PLACEMENT Left 07/15/2021   Procedure: CYSTOSCOPY WITH STENT PLACEMENT;  Surgeon: Milderd Meager., MD;  Location: [facility] ORS;  Service: Urology;  Laterality: Left;   ENDARTERECTOMY Left 12/02/2014   Procedure: ENDARTERECTOMY CAROTID;  Surgeon: Larina Earthly, MD;  Location: [facility] OR;  Service: Vascular;  Laterality: Left;   LAPAROSCOPIC NEPHRECTOMY Left    LAPAROSCOPIC NEPHRECTOMY Left 11/25/2021   Procedure: LEFT LAPAROSCOPIC RADICAL NEPHRECTOMY;  Surgeon: Crist Fat, MD;  Location: [facility] ORS;  Service: Urology;  Laterality: Left;   LEFT HEART CATH AND CORONARY ANGIOGRAPHY N/A 08/02/2016   Procedure: Left Heart Cath and Coronary Angiography;  Surgeon: Yvonne Kendall, MD;  Location: [facility];  Service: Cardiovascular;  Laterality: N/A;   LOWER EXTREMITY ANGIOGRAM   08/02/2016   LOWER EXTREMITY ANGIOGRAPHY N/A 08/02/2016   Procedure: Lower Extremity Angiography;  Surgeon: Yvonne Kendall, MD;  Location: [facility];  Service: Cardiovascular;  Laterality: N/A;   NEPHRECTOMY Left    VAGINAL HYSTERECTOMY  1991   "partial"    Social History: Social History   Socioeconomic History   Marital status: Married    Spouse name: Not on file   Number of children: 2   Years of education: 12   Highest education level: Not on file  Occupational History   Not on file  Tobacco Use   Smoking status: Every Day    Packs/day: 2.00    Years: 43.00    Additional pack years: 0.00    Total pack years: 86.00    Types: Cigarettes    Start date: 05/17/1974   Smokeless tobacco: Never  Vaping Use   Vaping Use: Never used  Substance and Sexual Activity   Alcohol use: No    Alcohol/week: 0.0 standard drinks of alcohol   Drug use: No   Sexual activity: Not Currently  Other Topics Concern   Not on file  Social History Narrative   Patient does not drink caffeine.   Patient is right handed.    Social Determinants of Health   Financial Resource Strain: Not on file  Food Insecurity: No Food Insecurity (11/25/2021)   Hunger Vital Sign    Worried About Running Out of Food in the Last Year: Never true    Ran Out of Food in the Last Year: Never true  Transportation Needs: No Transportation Needs (11/25/2021)   PRAPARE - Administrator, Civil Service (Medical): No    Lack of Transportation (Non-Medical): No  Physical Activity: Not on file  Stress: Not on file  Social Connections: Not on file  Intimate Partner Violence: Not At Risk (11/25/2021)   Humiliation, Afraid, Rape, and Kick questionnaire    Fear of Current or Ex-Partner: No    Emotionally Abused: No    Physically Abused: No    Sexually Abused: No    Family History: Family History  Problem Relation Age of Onset   Congestive Heart Failure Mother    COPD Father    Cancer Brother     Cancer Brother    Breast cancer Neg Hx     Current Medications:  Current Outpatient Medications:    acetaminophen (TYLENOL) 325 MG tablet, Take 2 tablets (650 mg total) by mouth every 4 (four) hours as needed for headache or  mild pain. (Patient taking differently: Take 500 mg by mouth every 4 (four) hours as needed for headache or mild pain.), Disp: , Rfl:    albuterol (VENTOLIN HFA) 108 (90 Base) MCG/ACT inhaler, Inhale 1-2 puffs into the lungs every 4 (four) hours as needed for shortness of breath or wheezing., Disp: , Rfl:    ALPRAZolam (XANAX) 0.5 MG tablet, Take 0.25 mg by mouth 2 (two) times daily., Disp: , Rfl:    amLODipine (NORVASC) 5 MG tablet, Take 1 tablet (5 mg total) by mouth daily., Disp: 30 tablet, Rfl: 1   benzonatate (TESSALON) 100 MG capsule, Take 100 mg by mouth 4 (four) times daily as needed for cough., Disp: , Rfl:    Biotin 5000 MCG CAPS, Take 5,000 mcg by mouth daily., Disp: , Rfl:    Blood Glucose Monitoring Suppl (ACCU-CHEK GUIDE) w/Device KIT, 1 Piece by Does not apply route as directed., Disp: 1 kit, Rfl: 0   calcitRIOL (ROCALTROL) 0.25 MCG capsule, Take by mouth., Disp: , Rfl:    calcium acetate (PHOSLO) 667 MG tablet, Take by mouth., Disp: , Rfl:    Cholecalciferol (VITAMIN D3) 125 MCG (5000 UT) CAPS, Take 5,000 Units by mouth once a week. Tuesday, Disp: , Rfl:    clopidogrel (PLAVIX) 75 MG tablet, Take 1 tablet (75 mg total) by mouth daily., Disp: 30 tablet, Rfl: 3   escitalopram (LEXAPRO) 20 MG tablet, Take 20 mg by mouth daily., Disp: , Rfl:    gabapentin (NEURONTIN) 300 MG capsule, Take 300 mg by mouth at bedtime., Disp: , Rfl:    glucose blood (ACCU-CHEK GUIDE) test strip, Use as instructed, Disp: 150 each, Rfl: 2   metoprolol tartrate (LOPRESSOR) 25 MG tablet, Take 0.5 tablets (12.5 mg total) by mouth 2 (two) times daily., Disp: 90 tablet, Rfl: 3   nicotine (NICODERM CQ - DOSED IN MG/24 HOURS) 21 mg/24hr patch, Place 1 patch (21 mg total) onto the skin  daily., Disp: 30 patch, Rfl: 1   nystatin cream (MYCOSTATIN), Apply 1 application  topically 2 (two) times daily as needed (Itching)., Disp: , Rfl: 5   ondansetron (ZOFRAN) 4 MG tablet, TAKE 1 TABLET(4 MG) BY MOUTH TWICE DAILY AS NEEDED FOR NAUSEA OR VOMITING, Disp: 20 tablet, Rfl: 0   oxyCODONE-acetaminophen (PERCOCET/ROXICET) 5-325 MG tablet, Take 1 tablet by mouth 3 (three) times daily as needed., Disp: , Rfl:    pantoprazole (PROTONIX) 40 MG tablet, TAKE 1 TABLET(40 MG) BY MOUTH DAILY BEFORE BREAKFAST, Disp: 90 tablet, Rfl: 0   promethazine (PHENERGAN) 25 MG tablet, Take 25 mg by mouth 4 (four) times daily as needed., Disp: , Rfl:    rosuvastatin (CRESTOR) 20 MG tablet, TAKE 1 TABLET(20 MG) BY MOUTH DAILY, Disp: 90 tablet, Rfl: 0   Allergies: Allergies  Allergen Reactions   Codeine Shortness Of Breath   Latex Itching   Sulfa Antibiotics Other (See Comments)    Reaction:  Unknown     REVIEW OF SYSTEMS:   Review of Systems  Constitutional:  Negative for chills, fatigue and fever.  HENT:   Negative for lump/mass, mouth sores, nosebleeds, sore throat and trouble swallowing.   Eyes:  Negative for eye problems.  Respiratory:  Positive for shortness of breath. Negative for cough.   Cardiovascular:  Positive for palpitations. Negative for chest pain and leg swelling.  Gastrointestinal:  Positive for diarrhea. Negative for abdominal pain, constipation, nausea and vomiting.  Genitourinary:  Negative for bladder incontinence, difficulty urinating, dysuria, frequency, hematuria and nocturia.  Musculoskeletal:  Negative for arthralgias, back pain, flank pain, myalgias and neck pain.  Skin:  Negative for itching and rash.  Neurological:  Negative for dizziness, headaches and numbness.  Hematological:  Does not bruise/bleed easily.  Psychiatric/Behavioral:  Positive for depression and sleep disturbance. Negative for suicidal ideas. The patient is nervous/anxious.   All other systems reviewed  and are negative.    VITALS:   Blood pressure 94/73, pulse 78, temperature 98.3 F (36.8 C), temperature source Tympanic, resp. rate 18, weight 136 lb 6.4 oz (61.9 kg), SpO2 96 %.  Wt Readings from Last 3 Encounters:  07/12/22 136 lb 6.4 oz (61.9 kg)  06/08/22 136 lb 12.8 oz (62.1 kg)  04/30/22 139 lb (63 kg)    Body mass index is 22.02 kg/m.  Performance status (ECOG): 1 - Symptomatic but completely ambulatory  PHYSICAL EXAM:   Physical Exam Vitals and nursing note reviewed. Exam conducted with a chaperone present.  Constitutional:      Appearance: Normal appearance.  Cardiovascular:     Rate and Rhythm: Normal rate and regular rhythm.     Pulses: Normal pulses.     Heart sounds: Normal heart sounds.  Pulmonary:     Effort: Pulmonary effort is normal.     Breath sounds: Normal breath sounds.  Abdominal:     Palpations: Abdomen is soft. There is no hepatomegaly, splenomegaly or mass.     Tenderness: There is no abdominal tenderness.  Musculoskeletal:     Right lower leg: No edema.     Left lower leg: No edema.  Lymphadenopathy:     Cervical: No cervical adenopathy.     Right cervical: No superficial, deep or posterior cervical adenopathy.    Left cervical: No superficial, deep or posterior cervical adenopathy.     Upper Body:     Right upper body: No supraclavicular or axillary adenopathy.     Left upper body: No supraclavicular or axillary adenopathy.  Neurological:     General: No focal deficit present.     Mental Status: She is alert and oriented to person, place, and time.  Psychiatric:        Mood and Affect: Mood normal.        Behavior: Behavior normal.     LABS:      Latest Ref Rng & Units 07/05/2022   12:09 PM 03/31/2022   11:14 AM 11/28/2021   11:50 AM  CBC  WBC 4.0 - 10.5 K/uL 8.8  8.4  13.1   Hemoglobin 12.0 - 15.0 g/dL 16.1  09.6  04.5   Hematocrit 36.0 - 46.0 % 36.0  33.8  36.2   Platelets 150 - 400 K/uL 232  224  223       Latest Ref Rng &  Units 07/05/2022   12:09 PM 03/31/2022   11:14 AM 11/28/2021   11:50 AM  CMP  Glucose 70 - 99 mg/dL 409  811  914   BUN 8 - 23 mg/dL 66  64  65   Creatinine 0.44 - 1.00 mg/dL 7.82  9.56  2.13   Sodium 135 - 145 mmol/L 137  132  136   Potassium 3.5 - 5.1 mmol/L 5.5  5.7  5.2   Chloride 98 - 111 mmol/L 105  110  108   CO2 22 - 32 mmol/L 21  13  19    Calcium 8.9 - 10.3 mg/dL 9.2  8.7  8.7   Total Protein 6.5 - 8.1 g/dL 7.4  7.0  Total Bilirubin 0.3 - 1.2 mg/dL 0.8  0.3    Alkaline Phos 38 - 126 U/L 60  87    AST 15 - 41 U/L 13  18    ALT 0 - 44 U/L 12  15       No results found for: "CEA1", "CEA" / No results found for: "CEA1", "CEA" No results found for: "PSA1" No results found for: "ZOX096" No results found for: "CAN125"  No results found for: "TOTALPROTELP", "ALBUMINELP", "A1GS", "A2GS", "BETS", "BETA2SER", "GAMS", "MSPIKE", "SPEI" Lab Results  Component Value Date   TIBC 337 07/05/2022   TIBC 307 03/31/2022   FERRITIN 32 07/05/2022   FERRITIN 59 03/31/2022   IRONPCTSAT 18 07/05/2022   IRONPCTSAT 13 03/31/2022   Lab Results  Component Value Date   LDH 123 03/31/2022     STUDIES:   CT CHEST ABDOMEN PELVIS WO CONTRAST  Result Date: 07/06/2022 CLINICAL DATA:  History of left renal cell carcinoma, active surveillance. * Tracking Code: BO * EXAM: CT CHEST, ABDOMEN AND PELVIS WITHOUT CONTRAST TECHNIQUE: Multidetector CT imaging of the chest, abdomen and pelvis was performed following the standard protocol without IV contrast. RADIATION DOSE REDUCTION: This exam was performed according to the departmental dose-optimization program which includes automated exposure control, adjustment of the mA and/or kV according to patient size and/or use of iterative reconstruction technique. COMPARISON:  Multiple priors including CT January 28, 2022 PET-CT October 08, 2021 and CT abdomen pelvis November 28, 2021 FINDINGS: CT CHEST FINDINGS Cardiovascular: Aortic atherosclerosis. Three-vessel  coronary artery calcifications. Calcifications of the aortic valve. Calcifications of the mitral annulus. Normal size heart. Trace pericardial effusion is within physiologic normal limits., Mediastinum/Nodes: No suspicious thyroid nodule. No pathologically enlarged mediastinal, hilar or axillary lymph nodes noting limitation of the evaluation for hilar adenopathy on noncontrast enhanced examination. The esophagus is grossly unremarkable. Lungs/Pleura: Stable scattered pulmonary nodules measuring up to 4 mm right upper lobe pulmonary nodule on image 38/3 present dating back to at least 2016 compatible with a benign process. No new suspicious pulmonary nodules or masses. No pleural effusion. No pneumothorax. Musculoskeletal: Multilevel degenerative changes spine. Diffuse demineralization of bone. No aggressive lytic or blastic lesion of bone. CT ABDOMEN PELVIS FINDINGS Hepatobiliary: Unremarkable noncontrast enhanced appearance of the hepatic parenchyma. Gallbladder surgically absent. No biliary ductal dilation. Pancreas: No pancreatic ductal dilation or evidence of acute inflammation. Spleen: No splenomegaly. Adrenals/Urinary Tract: Left adrenal gland it has not identified and likely surgically absent. Right adrenal gland appears normal. Left kidney surgically absent. No new suspicious soft tissue nodularity in the nephrectomy bed. Nonobstructive right renal stones measure up to 5 mm. Similar right renal scarring likely related to duplication of the collecting system. No hydronephrosis. Urinary bladder is unremarkable for degree of distension. Stomach/Bowel: Radiopaque enteric contrast material traverses the descending colon. Stomach is unremarkable for degree of distension. No pathologic dilation of small or large bowel. No evidence of acute bowel inflammation. Vascular/Lymphatic: Aortic atherosclerosis. Smooth IVC contours. No pathologically enlarged abdominal or pelvic lymph nodes. Reproductive: Surgically absent  uterus. Other: Pelvic floor laxity with prolapse appearance of the vagina and a small rectocele. Left fat and nonobstructed small bowel containing spigelian hernia. Musculoskeletal: No aggressive lytic or blastic lesion of bone. Diffuse demineralization of bone. Multilevel degenerative changes spine. Degenerative changes bilateral hips. IMPRESSION: 1. Stable examination status post left nephrectomy without evidence of local recurrence or metastatic disease within the chest, abdomen, or pelvis. 2. Nonobstructive right renal stones measure up to 5 mm. 3. Pelvic  floor laxity with prolapsed appearance of the vagina and a small rectocele. 4. Left fat and nonobstructed small bowel containing Spigelian hernia. 5.  Aortic Atherosclerosis (ICD10-I70.0). Electronically Signed   By: Maudry Mayhew M.D.   On: 07/06/2022 06:07

## 2022-07-12 ENCOUNTER — Inpatient Hospital Stay (HOSPITAL_BASED_OUTPATIENT_CLINIC_OR_DEPARTMENT_OTHER): Payer: PPO | Admitting: Hematology

## 2022-07-12 VITALS — BP 94/73 | HR 78 | Temp 98.3°F | Resp 18 | Wt 136.4 lb

## 2022-07-12 DIAGNOSIS — C642 Malignant neoplasm of left kidney, except renal pelvis: Secondary | ICD-10-CM | POA: Diagnosis not present

## 2022-07-12 DIAGNOSIS — Z08 Encounter for follow-up examination after completed treatment for malignant neoplasm: Secondary | ICD-10-CM | POA: Diagnosis not present

## 2022-07-12 NOTE — Patient Instructions (Addendum)
[facility]  Discharge Instructions  You were seen and examined today by Dr. Ellin Saba.  Dr. Ellin Saba discussed your most recent lab work and CT scan which revealed that everything looks good and stable.  Continue taking Iron once daily.  Follow-up as scheduled in 3 months.    Thank you for choosing [facility] to provide your oncology and hematology care.   To afford each patient quality time with our provider, please arrive at least 15 minutes before your scheduled appointment time. You may need to reschedule your appointment if you arrive late (10 or more minutes). Arriving late affects you and other patients whose appointments are after yours.  Also, if you miss three or more appointments without notifying the office, you may be dismissed from the clinic at the provider's discretion.    Again, thank you for choosing [facility].  Our hope is that these requests will decrease the amount of time that you wait before being seen by our physicians.   If you have a lab appointment with the Cancer Center - please note that after April 8th, all labs will be drawn in the cancer center.  You do not have to check in or register with the main entrance as you have in the past but will complete your check-in at the cancer center.            _____________________________________________________________  Should you have questions after your visit to [facility], please contact our office at [phone] and follow the prompts.  Our office hours are 8:00 a.m. to 4:30 p.m. Monday - Thursday and 8:00 a.m. to 2:30 p.m. Friday.  Please note that voicemails left after 4:00 p.m. may not be returned until the following business day.  We are closed weekends and all major holidays.  You do have access to a nurse 24-7, just call the main number to the clinic [phone] and do not press any options, hold on the line and a nurse  will answer the phone.    For prescription refill requests, have your pharmacy contact our office and allow 72 hours.    Masks are no longer required in the cancer centers. If you would like for your care team to wear a mask while they are taking care of you, please let them know. You may have one support person who is at least 67 years old accompany you for your appointments.

## 2022-07-23 ENCOUNTER — Ambulatory Visit: Payer: Self-pay | Admitting: Surgery

## 2022-07-23 NOTE — Progress Notes (Signed)
PLEASE PLACE ORDERS IN EPIC PT. IS SCHEDULED FOR A PREOP.

## 2022-07-23 NOTE — Patient Instructions (Addendum)
SURGICAL WAITING ROOM VISITATION  Patients having surgery or a procedure may have no more than 2 support people in the waiting area - these visitors may rotate.    Children under the age of 41 must have an adult with them who is not the patient.  If the patient needs to stay at the hospital during part of their recovery, the visitor guidelines for inpatient rooms apply.  Pre-op nurse will coordinate an appropriate time for 1 support person to accompany patient in pre-op.  This support person may not rotate.    Please refer to the [facility] website for the visitor guidelines for Inpatients (after your surgery is over and you are in a regular room).       Your procedure is scheduled on: 08-05-22   Report to [facility] Main Entrance    Report to admitting at      0515  AM   Call this number if you have problems the morning of surgery [phone]   Do not eat food :After Midnight.   After Midnight you may have the following liquids until __0430____ AM/ DAY OF SURGERY   then nothing by mouth  Water Non-Citrus Juices (without pulp, NO RED-Apple, White grape, White cranberry) Black Coffee (NO MILK/CREAM OR CREAMERS, sugar ok)  Clear Tea (NO MILK/CREAM OR CREAMERS, sugar ok) regular and decaf                             Plain Jell-O (NO RED)                                           Fruit ices (not with fruit pulp, NO RED)                                     Popsicles (NO RED)                                                               Sports drinks like Gatorade (NO RED)                          Drink 2 drinks the night before surgery at 10:00 PM     The day of surgery:  Drink ONE (1) Pre-Surgery  G2 at   0415 AM the morning of surgery. Drink in one sitting. Do not sip.  This drink was given to you during your hospital  pre-op appointment visit. Nothing else to drink after completing the  Pre-surgery G2.by 0430am          If you have questions, please contact your  surgeon's office.   FOLLOW  ANY ADDITIONAL PRE OP INSTRUCTIONS YOU RECEIVED FROM YOUR SURGEON'S OFFICE!!!     Oral Hygiene is also important to reduce your risk of infection.                                    Remember - BRUSH  YOUR TEETH THE MORNING OF SURGERY WITH YOUR REGULAR TOOTHPASTE  DENTURES WILL BE REMOVED PRIOR TO SURGERY PLEASE DO NOT APPLY "Poly grip" OR ADHESIVES!!!   Do NOT smoke after Midnight   Take these medicines the morning of surgery with A SIP OF WATER: amlodipine, xanax if needed, bring inhaler with you, escitalopram(lexapro), venlafaxine   Day before surgery take 50% EVENING TRESBIA DOSE   Day of surgery take 50% of TRESBIA DOSE                                You may not have any metal on your body including hair pins, jewelry, and body piercing             Do not wear make-up, lotions, powders, perfumes/cologne, or deodorant  Do not wear nail polish including gel and S&S, artificial/acrylic nails, or any other type of covering on natural nails including finger and toenails. If you have artificial nails, gel coating, etc. that needs to be removed by a nail salon please have this removed prior to surgery or surgery may need to be canceled/ delayed if the surgeon/ anesthesia feels like they are unable to be safely monitored.   Do not shave  48 hours prior to surgery.         Do not bring valuables to the hospital. [facility] IS NOT             RESPONSIBLE   FOR VALUABLES.   Contacts, glasses, dentures or bridgework may not be worn into surgery.   Bring small overnight bag day of surgery.   DO NOT BRING YOUR HOME MEDICATIONS TO THE HOSPITAL. PHARMACY WILL DISPENSE MEDICATIONS LISTED ON YOUR MEDICATION LIST TO YOU DURING YOUR ADMISSION IN THE HOSPITAL!    Patients discharged on the day of surgery will not be allowed to drive home.  Someone NEEDS to stay with you for the first 24 hours after anesthesia.   Special Instructions: Bring a copy of your  healthcare power of attorney and living will documents the day of surgery if you haven't scanned them before.              Please read over the following fact sheets you were given: IF YOU HAVE QUESTIONS ABOUT YOUR PRE-OP INSTRUCTIONS PLEASE CALL [phone]    If you test positive for Covid or have been in contact with anyone that has tested positive in the last 10 days please notify you surgeon.    [place] - Preparing for Surgery Before surgery, you can play an important role.  Because skin is not sterile, your skin needs to be as free of germs as possible.  You can reduce the number of germs on your skin by washing with CHG (chlorahexidine gluconate) soap before surgery.  CHG is an antiseptic cleaner which kills germs and bonds with the skin to continue killing germs even after washing. Please DO NOT use if you have an allergy to CHG or antibacterial soaps.  If your skin becomes reddened/irritated stop using the CHG and inform your nurse when you arrive at Short Stay. Do not shave (including legs and underarms) for at least 48 hours prior to the first CHG shower.  You may shave your face/neck. Please follow these instructions carefully:  1.  Shower with CHG Soap the night before surgery and the  morning of Surgery.  2.  If you choose to wash your hair, wash  your hair first as usual with your  normal  shampoo.  3.  After you shampoo, rinse your hair and body thoroughly to remove the  shampoo.                           4.  Use CHG as you would any other liquid soap.  You can apply chg directly  to the skin and wash                       Gently with a scrungie or clean washcloth.  5.  Apply the CHG Soap to your body ONLY FROM THE NECK DOWN.   Do not use on face/ open                           Wound or open sores. Avoid contact with eyes, ears mouth and genitals (private parts).                       Wash face,  Genitals (private parts) with your normal soap.             6.  Wash thoroughly,  paying special attention to the area where your surgery  will be performed.  7.  Thoroughly rinse your body with warm water from the neck down.  8.  DO NOT shower/wash with your normal soap after using and rinsing off  the CHG Soap.                9.  Pat yourself dry with a clean towel.            10.  Wear clean pajamas.            11.  Place clean sheets on your bed the night of your first shower and do not  sleep with pets. Day of Surgery : Do not apply any lotions/deodorants the morning of surgery.  Please wear clean clothes to the hospital/surgery center.  FAILURE TO FOLLOW THESE INSTRUCTIONS MAY RESULT IN THE CANCELLATION OF YOUR SURGERY PATIENT SIGNATURE_________________________________  NURSE SIGNATURE__________________________________  ________________________________________________________________________

## 2022-07-26 ENCOUNTER — Other Ambulatory Visit (INDEPENDENT_AMBULATORY_CARE_PROVIDER_SITE_OTHER): Payer: Self-pay | Admitting: Gastroenterology

## 2022-07-26 ENCOUNTER — Encounter: Payer: Self-pay | Admitting: Nurse Practitioner

## 2022-07-26 ENCOUNTER — Ambulatory Visit: Payer: Self-pay | Admitting: Surgery

## 2022-07-26 ENCOUNTER — Ambulatory Visit (INDEPENDENT_AMBULATORY_CARE_PROVIDER_SITE_OTHER): Payer: PPO | Admitting: Nurse Practitioner

## 2022-07-26 VITALS — BP 123/68 | HR 66 | Ht 66.0 in | Wt 140.8 lb

## 2022-07-26 DIAGNOSIS — E559 Vitamin D deficiency, unspecified: Secondary | ICD-10-CM

## 2022-07-26 DIAGNOSIS — E782 Mixed hyperlipidemia: Secondary | ICD-10-CM | POA: Diagnosis not present

## 2022-07-26 DIAGNOSIS — Z794 Long term (current) use of insulin: Secondary | ICD-10-CM | POA: Diagnosis not present

## 2022-07-26 DIAGNOSIS — I10 Essential (primary) hypertension: Secondary | ICD-10-CM | POA: Diagnosis not present

## 2022-07-26 DIAGNOSIS — E1159 Type 2 diabetes mellitus with other circulatory complications: Secondary | ICD-10-CM

## 2022-07-26 LAB — POCT GLYCOSYLATED HEMOGLOBIN (HGB A1C): Hemoglobin A1C: 9.5 % — AB (ref 4.0–5.6)

## 2022-07-26 MED ORDER — TRESIBA FLEXTOUCH 100 UNIT/ML ~~LOC~~ SOPN: 15.0000 [IU] | PEN_INJECTOR | Freq: Every day | SUBCUTANEOUS | 3 refills | Status: DC

## 2022-07-26 MED ORDER — PEN NEEDLES 31G X 6 MM MISC: 3 refills | Status: DC

## 2022-07-26 NOTE — Progress Notes (Signed)
07/26/2022, 4:10 PM                                Endocrinology follow-up note   Subjective:    Patient ID: Grace Hess, female    DOB: April 02, 1955.  Grace Hess is being seen in follow-up for management of currently uncontrolled symptomatic diabetes requested by  Elfredia Nevins, MD.   Past Medical History:  Diagnosis Date   Anxiety    Arthritis    "might have a touch in my fingers" (08/02/2016)   CAD (coronary artery disease)    a. s/p DES to RCA in 07/2016 with residual mid-LAD stenosis   Cancer (HCC)    skin cancer on finger   Cervicalgia    Chest pain, unspecified    Depression    GERD (gastroesophageal reflux disease)    Headache    "bad ones after my stroke in 2016; only have them when I get bad news now" (08/02/2016)   Heart murmur    Heavy smoker (more than 20 cigarettes per day)    Scheduled for LDCT screening 02/11/15   History of hiatal hernia    History of kidney stones    "no cysto/OR" (12/10/2014)   Hypercholesteremia    Hypertension    Obesity    Reflux esophagitis    Stroke [facility])    "they said I had a silent stroke a few months back/MRI" (12/10/2014)   TIA (transient ischemic attack) 11/28/2014   [name] 11/28/2014   Type II diabetes mellitus (HCC)     Past Surgical History:  Procedure Laterality Date   ABDOMINAL AORTOGRAM N/A 08/02/2016   Procedure: Abdominal Aortogram;  Surgeon: Yvonne Kendall, MD;  Location: [facility];  Service: Cardiovascular;  Laterality: N/A;   CAROTID STENT     CHOLECYSTECTOMY OPEN  1978   COLONOSCOPY  2010   single diverticulum in sigmoid colon   CORONARY ANGIOPLASTY WITH STENT PLACEMENT  08/02/2016   to RCA   CORONARY STENT INTERVENTION N/A 08/02/2016   Procedure: Coronary Stent Intervention;  Surgeon: Yvonne Kendall, MD;  Location: [facility];  Service: Cardiovascular;  Laterality: N/A;  RCA   CYSTOSCOPY W/  RETROGRADES Bilateral 07/15/2021   Procedure: CYSTOSCOPY WITH RETROGRADE PYELOGRAM;  Surgeon: Milderd Meager., MD;  Location: [facility];  Service: Urology;  Laterality: Bilateral;   CYSTOSCOPY WITH STENT PLACEMENT Left 07/15/2021   Procedure: CYSTOSCOPY WITH STENT PLACEMENT;  Surgeon: Milderd Meager., MD;  Location: [facility];  Service: Urology;  Laterality: Left;   ENDARTERECTOMY Left 12/02/2014   Procedure: ENDARTERECTOMY CAROTID;  Surgeon: Larina Earthly, MD;  Location[facility];  Service: Vascular;  Laterality: Left;   LAPAROSCOPIC NEPHRECTOMY Left    LAPAROSCOPIC NEPHRECTOMY Left 11/25/2021   Procedure: LEFT LAPAROSCOPIC RADICAL NEPHRECTOMY;  Surgeon: Crist Fat, MD;  Location: [facility];  Service: Urology;  Laterality: Left;   LEFT HEART CATH AND CORONARY ANGIOGRAPHY N/A 08/02/2016   Procedure: Left Heart Cath and Coronary Angiography;  Surgeon: Yvonne Kendall, MD;  Location: [facility];  Service: Cardiovascular;  Laterality: N/A;   LOWER EXTREMITY ANGIOGRAM  08/02/2016   LOWER EXTREMITY ANGIOGRAPHY N/A 08/02/2016   Procedure: Lower Extremity Angiography;  Surgeon: Yvonne Kendall, MD;  Location: [facility];  Service: Cardiovascular;  Laterality: N/A;   NEPHRECTOMY Left    VAGINAL HYSTERECTOMY  1991   "partial"    Social History   Socioeconomic History   Marital status: Married    Spouse name: Not on file   Number of children: 2   Years of education: 12   Highest education level: Not on file  Occupational History   Not on file  Tobacco Use   Smoking status: Every Day    Packs/day: 2.00    Years: 43.00    Additional pack years: 0.00    Total pack years: 86.00    Types: Cigarettes    Start date: 05/17/1974   Smokeless tobacco: Never  Vaping Use   Vaping Use: Never used  Substance and Sexual Activity   Alcohol use: No    Alcohol/week: 0.0 standard drinks of alcohol   Drug use: No   Sexual activity: Not Currently  Other Topics Concern   Not on  file  Social History Narrative   Patient does not drink caffeine.   Patient is right handed.    Social Determinants of Health   Financial Resource Strain: Not on file  Food Insecurity: No Food Insecurity (11/25/2021)   Hunger Vital Sign    Worried About Running Out of Food in the Last Year: Never true    Ran Out of Food in the Last Year: Never true  Transportation Needs: No Transportation Needs (11/25/2021)   PRAPARE - Administrator, Civil Service (Medical): No    Lack of Transportation (Non-Medical): No  Physical Activity: Not on file  Stress: Not on file  Social Connections: Not on file    Family History  Problem Relation Age of Onset   Congestive Heart Failure Mother    COPD Father    Cancer Brother    Cancer Brother    Breast cancer Neg Hx     Outpatient Encounter Medications as of 07/26/2022  Medication Sig   acetaminophen (TYLENOL) 325 MG tablet Take 2 tablets (650 mg total) by mouth every 4 (four) hours as needed for headache or mild pain. (Patient taking differently: Take 500 mg by mouth every 4 (four) hours as needed for headache or mild pain.)   albuterol (VENTOLIN HFA) 108 (90 Base) MCG/ACT inhaler Inhale 1-2 puffs into the lungs every 4 (four) hours as needed for shortness of breath or wheezing.   ALPRAZolam (XANAX) 0.5 MG tablet Take 0.25 mg by mouth 2 (two) times daily.   amLODipine (NORVASC) 5 MG tablet Take 1 tablet (5 mg total) by mouth daily.   benzonatate (TESSALON) 100 MG capsule Take 100 mg by mouth 4 (four) times daily as needed for cough.   Biotin 5000 MCG CAPS Take 5,000 mcg by mouth daily.   Blood Glucose Monitoring Suppl (ACCU-CHEK GUIDE) w/Device KIT 1 Piece by Does not apply route as directed.   calcitRIOL (ROCALTROL) 0.25 MCG capsule Take by mouth.   calcium acetate (PHOSLO) 667 MG tablet Take by mouth.   Cholecalciferol (VITAMIN D3) 125 MCG (5000 UT) CAPS Take 5,000 Units by mouth once a week. Tuesday   clopidogrel (PLAVIX) 75 MG tablet  Take 1 tablet (75 mg total) by mouth daily.   escitalopram (LEXAPRO) 20 MG tablet Take 20 mg by mouth  daily.   gabapentin (NEURONTIN) 300 MG capsule Take 300 mg by mouth at bedtime.   glucose blood (ACCU-CHEK GUIDE) test strip Use as instructed   insulin degludec (TRESIBA FLEXTOUCH) 100 UNIT/ML FlexTouch Pen Inject 15 Units into the skin at bedtime.   Insulin Pen Needle (PEN NEEDLES) 31G X 6 MM MISC Use to inject insulin once daily   LOKELMA 10 g PACK packet Take 10 g by mouth 3 (three) times a week.   metoprolol tartrate (LOPRESSOR) 25 MG tablet Take 0.5 tablets (12.5 mg total) by mouth 2 (two) times daily.   nicotine (NICODERM CQ - DOSED IN MG/24 HOURS) 21 mg/24hr patch Place 1 patch (21 mg total) onto the skin daily.   nystatin cream (MYCOSTATIN) Apply 1 application  topically 2 (two) times daily as needed (Itching).   ondansetron (ZOFRAN) 4 MG tablet TAKE 1 TABLET(4 MG) BY MOUTH TWICE DAILY AS NEEDED FOR NAUSEA OR VOMITING   oxyCODONE-acetaminophen (PERCOCET/ROXICET) 5-325 MG tablet Take 1 tablet by mouth 3 (three) times daily as needed.   pantoprazole (PROTONIX) 40 MG tablet TAKE 1 TABLET(40 MG) BY MOUTH DAILY BEFORE BREAKFAST   promethazine (PHENERGAN) 25 MG tablet Take 25 mg by mouth 4 (four) times daily as needed.   rosuvastatin (CRESTOR) 20 MG tablet TAKE 1 TABLET(20 MG) BY MOUTH DAILY   No facility-administered encounter medications on file as of 07/26/2022.    ALLERGIES: Allergies  Allergen Reactions   Codeine Shortness Of Breath   Latex Itching   Sulfa Antibiotics Other (See Comments)    Reaction:  Unknown     VACCINATION STATUS: Immunization History  Administered Date(s) Administered   Influenza,inj,Quad PF,6+ Mos 11/29/2014   Pneumococcal Polysaccharide-23 11/29/2014    Diabetes She presents for her follow-up diabetic visit. She has type 2 diabetes mellitus. Onset time: She was diagnosed at approximate age of 50 years. Her disease course has been worsening. There are  no hypoglycemic associated symptoms. Pertinent negatives for hypoglycemia include no confusion, headaches, pallor or seizures. Associated symptoms include fatigue and foot paresthesias. Pertinent negatives for diabetes include no chest pain, no polydipsia, no polyphagia, no polyuria and no weight loss. There are no hypoglycemic complications. Symptoms are stable. Diabetic complications include a CVA, heart disease and nephropathy. Risk factors for coronary artery disease include dyslipidemia, diabetes mellitus, family history, hypertension, tobacco exposure, sedentary lifestyle and post-menopausal. Current diabetic treatment includes diet. She is compliant with treatment most of the time. Her weight is decreasing steadily. She is following a generally healthy diet. When asked about meal planning, she reported none. She has not had a previous visit with a dietitian. She never participates in exercise. Her overall blood glucose range is >200 mg/dl. (She presents today with her meter and logs showing inconsistent glucose monitoring and gross hyperglycemia overall.  She was not asked to routinely monitor glucose as she was not on medications.  Her POCT A1c today is 9.5%, increasing from last visit of 7.4%.  She reports no real change in diet or activity level.  ) An ACE inhibitor/angiotensin II receptor blocker is being taken. She does not see a podiatrist.Eye exam is not current.  Hyperlipidemia This is a chronic problem. The current episode started more than 1 year ago. The problem is controlled. Recent lipid tests were reviewed and are normal. Exacerbating diseases include chronic renal disease and diabetes. Factors aggravating her hyperlipidemia include beta blockers, fatty foods and smoking. Pertinent negatives include no chest pain, myalgias or shortness of breath. Current antihyperlipidemic treatment includes statins.  The current treatment provides mild improvement of lipids. Compliance problems include  adherence to diet and adherence to exercise.  Risk factors for coronary artery disease include diabetes mellitus, dyslipidemia, hypertension, a sedentary lifestyle, post-menopausal and family history.  Hypertension This is a chronic problem. The current episode started more than 1 year ago. The problem has been resolved since onset. The problem is controlled. Pertinent negatives include no chest pain, headaches, palpitations or shortness of breath. There are no associated agents to hypertension. Risk factors for coronary artery disease include dyslipidemia, diabetes mellitus, sedentary lifestyle, smoking/tobacco exposure and post-menopausal state. Past treatments include ACE inhibitors and beta blockers. The current treatment provides moderate improvement. Compliance problems include exercise and diet.  Hypertensive end-organ damage includes kidney disease, CAD/MI and CVA. Identifiable causes of hypertension include chronic renal disease.    Review of systems  Constitutional: + increasing weight,  current Body mass index is 22.73 kg/m. , + fatigue, no subjective hyperthermia, no subjective hypothermia Eyes: no blurry vision, no xerophthalmia ENT: no sore throat, no nodules palpated in throat, no dysphagia/odynophagia, no hoarseness Cardiovascular: no chest pain, no shortness of breath, no palpitations, no leg swelling Respiratory: no cough, no shortness of breath Gastrointestinal: no nausea/vomiting/diarrhea Musculoskeletal: no muscle/joint aches Skin: no rashes, no hyperemia Neurological: no tremors, no dizziness, + numbness/tingling to BLE (feet) Psychiatric: no depression, no anxiety  Objective:    BP 123/68 (BP Location: Left Arm, Patient Position: Sitting, Cuff Size: Normal)   Pulse 66   Ht 5\' 6"  (1.676 m)   Wt 140 lb 12.8 oz (63.9 kg)   BMI 22.73 kg/m   Wt Readings from Last 3 Encounters:  07/26/22 140 lb 12.8 oz (63.9 kg)  07/12/22 136 lb 6.4 oz (61.9 kg)  06/08/22 136 lb 12.8 oz  (62.1 kg)    BP Readings from Last 3 Encounters:  07/26/22 123/68  07/12/22 94/73  06/08/22 (!) 108/54     Physical Exam- Limited  Constitutional:  Body mass index is 22.73 kg/m. , not in acute distress Eyes:  EOMI, no exophthalmos Musculoskeletal: no gross deformities, strength intact in all four extremities, no gross restriction of joint movements, mass on left lower quadrant of abdomen near surgical site (hernia- supposed to be having surgical repair soon) Skin:  no rashes, no hyperemia Neurological: no tremor with outstretched hands  Diabetic Foot Exam - Simple   No data filed    CMP     Component Value Date/Time   NA 137 07/05/2022 1209   NA 141 09/28/2021 0822   K 5.5 (H) 07/05/2022 1209   CL 105 07/05/2022 1209   CO2 21 (L) 07/05/2022 1209   GLUCOSE 163 (H) 07/05/2022 1209   BUN 66 (H) 07/05/2022 1209   BUN 30 (H) 09/28/2021 0822   CREATININE 3.46 (H) 07/05/2022 1209   CREATININE 1.38 (H) 08/20/2019 0719   CALCIUM 9.2 07/05/2022 1209   PROT 7.4 07/05/2022 1209   PROT 6.6 09/28/2021 0822   ALBUMIN 3.9 07/05/2022 1209   ALBUMIN 4.2 09/28/2021 0822   AST 13 (L) 07/05/2022 1209   ALT 12 07/05/2022 1209   ALKPHOS 60 07/05/2022 1209   BILITOT 0.8 07/05/2022 1209   BILITOT 0.3 09/28/2021 0822   GFRNONAA 14 (L) 07/05/2022 1209   GFRNONAA 40 (L) 08/20/2019 0719   GFRAA 51 (L) 03/26/2020 0927   GFRAA 47 (L) 08/20/2019 0719     Diabetic Labs (most recent): Lab Results  Component Value Date   HGBA1C 9.5 (A) 07/26/2022   HGBA1C  7.4 (A) 04/07/2022   HGBA1C 8.5 (H) 11/19/2021   MICROALBUR 26.7 08/20/2019    Lipid Panel     Component Value Date/Time   CHOL 161 03/30/2021 0803   TRIG 212 (H) 03/30/2021 0803   HDL 35 (L) 03/30/2021 0803   CHOLHDL 4.6 (H) 03/30/2021 0803   CHOLHDL 3.6 01/16/2019 0937   VLDL UNABLE TO CALCULATE IF TRIGLYCERIDE OVER 400 mg/dL 16/12/9602 5409   LDLCALC 90 03/30/2021 0803   LDLCALC 69 01/16/2019 0937   LABVLDL 36 03/30/2021  0803       Assessment & Plan:   1) DM type 2 causing vascular disease (HCC)  - LEGACIE DETORE has currently uncontrolled symptomatic type 2 DM since  67 years of age.  She presents today with her meter and logs showing inconsistent glucose monitoring and gross hyperglycemia overall.  She was not asked to routinely monitor glucose as she was not on medications.  Her POCT A1c today is 9.5%, increasing from last visit of 7.4%.  She reports no real change in diet or activity level.    -Recent labs reviewed showing worsening kidney function- will refer to nephrology for further evaluation and treatment.  - I had a long discussion with her about the progressive nature of diabetes and the pathology behind its complications. -her diabetes is complicated by coronary artery disease, CVA, nephropathy, peripheral neuropathy, chronic heavy smoking, sedentary life and she remains at a high risk for more acute and chronic complications which include CAD, CVA, CKD, retinopathy, and neuropathy. These are all discussed in detail with her.  - Nutritional counseling repeated at each appointment due to patients tendency to fall back in to old habits.  - The patient admits there is a room for improvement in their diet and drink choices. -  Suggestion is made for the patient to avoid simple carbohydrates from their diet including Cakes, Sweet Desserts / Pastries, Ice Cream, Soda (diet and regular), Sweet Tea, Candies, Chips, Cookies, Sweet Pastries, Store Bought Juices, Alcohol in Excess of 1-2 drinks a day, Artificial Sweeteners, Coffee Creamer, and "Sugar-free" Products. This will help patient to have stable blood glucose profile and potentially avoid unintended weight gain.   - I encouraged the patient to switch to unprocessed or minimally processed complex starch and increased protein intake (animal or plant source), fruits, and vegetables.   - Patient is advised to stick to a routine mealtimes to eat 3  meals a day and avoid unnecessary snacks (to snack only to correct hypoglycemia).  - I have approached her with the following individualized plan to manage  her diabetes and patient agrees:   -Given her recent increase in numbers, will start her on insulin (safest treatment for kidneys) with Tresiba 15 units SQ nightly.  Gave sample today.  We went over proper injection technique in the room today.  She is advised to restart monitoring glucose twice daily, before breakfast and before bed, and to call the clinic if she has readings less than 70 or above 200 for 3 tests in a row.  She is not a candidate for Metformin due to kidney disease, she does not meet criteria for GLP1 use with BMI less than 25.  She was on Glipizide in the past but had severe hypoglycemia.  - Specific targets for  A1c;  LDL, HDL, Triglycerides, and  Waist Circumference were discussed with the patient.  2) Blood Pressure /Hypertension:  Her blood pressure is controlled to target.  She is advised to continue Lisinopril  20 mg po daily and Metoprolol 25 mg po twice daily.   3) Lipids/Hyperlipidemia:   Her most recent lipid panel from 03/30/21 shows controlled LDL of 90 and elevated triglycerides of 212.  She is advised to continue Crestor 20 mg po daily at bedtime.  Side effects and precautions discussed with her.  She is advised to avoid fried foods and butter.    4)  Weight/Diet:  Her Body mass index is 22.73 kg/m..  she is not a candidate for more weight loss.   Exercise, and detailed carbohydrates information provided  -  detailed on discharge instructions.  5) Chronic Care/Health Maintenance: -she is on ACEI/ARB and Statin medications and is encouraged to initiate and continue to follow up with Ophthalmology, Dentist, Podiatrist at least yearly or according to recommendations, and advised to quit smoking. I have recommended yearly flu vaccine and pneumonia vaccine at least every 5 years; moderate intensity exercise for up  to 150 minutes weekly; and sleep for at least 7 hours a day.  - she is advised to maintain close follow up with Elfredia Nevins, MD for primary care needs (will be calling him about her fatigue to have iron checked), as well as her other providers for optimal and coordinated care (also reaching out to surgeon about the mass that has appeared.        I spent  40  minutes in the care of the patient today including review of labs from CMP, Lipids, Thyroid Function, Hematology (current and previous including abstractions from other facilities); face-to-face time discussing  her blood glucose readings/logs, discussing hypoglycemia and hyperglycemia episodes and symptoms, medications doses, her options of short and long term treatment based on the latest standards of care / guidelines;  discussion about incorporating lifestyle medicine;  and documenting the encounter. Risk reduction counseling performed per USPSTF guidelines to reduce obesity and cardiovascular risk factors.     Please refer to Patient Instructions for Blood Glucose Monitoring and Insulin/Medications Dosing Guide"  in media tab for additional information. Please  also refer to " Patient Self Inventory" in the Media  tab for reviewed elements of pertinent patient history.  Boykin Nearing participated in the discussions, expressed understanding, and voiced agreement with the above plans.  All questions were answered to her satisfaction. she is encouraged to contact clinic should she have any questions or concerns prior to her return visit.   Follow up plan: - Return in about 3 months (around 10/26/2022) for Diabetes F/U with A1c in office, No previsit labs, Bring meter and logs.  Ronny Bacon, [facility] [address] [place], [place] [address] Phone: [phone] Fax: [phone]  07/26/2022, 4:10 PM

## 2022-07-27 ENCOUNTER — Encounter (HOSPITAL_COMMUNITY)
Admission: RE | Admit: 2022-07-27 | Discharge: 2022-07-27 | Disposition: A | Discharge status: Home / Self Care | Payer: PPO | Source: Ambulatory Visit | Attending: Surgery | Admitting: Surgery

## 2022-07-27 ENCOUNTER — Other Ambulatory Visit: Payer: Self-pay

## 2022-07-27 ENCOUNTER — Encounter (HOSPITAL_COMMUNITY): Payer: Self-pay

## 2022-07-27 VITALS — BP 119/63 | HR 62 | Temp 98.3°F | Resp 16 | Ht 66.0 in | Wt 136.0 lb

## 2022-07-27 DIAGNOSIS — Z01812 Encounter for preprocedural laboratory examination: Secondary | ICD-10-CM | POA: Insufficient documentation

## 2022-07-27 DIAGNOSIS — E119 Type 2 diabetes mellitus without complications: Secondary | ICD-10-CM

## 2022-07-27 DIAGNOSIS — Z8673 Personal history of transient ischemic attack (TIA), and cerebral infarction without residual deficits: Secondary | ICD-10-CM | POA: Diagnosis not present

## 2022-07-27 DIAGNOSIS — I700 Atherosclerosis of aorta: Secondary | ICD-10-CM | POA: Insufficient documentation

## 2022-07-27 DIAGNOSIS — Z87442 Personal history of urinary calculi: Secondary | ICD-10-CM | POA: Diagnosis not present

## 2022-07-27 DIAGNOSIS — I129 Hypertensive chronic kidney disease with stage 1 through stage 4 chronic kidney disease, or unspecified chronic kidney disease: Secondary | ICD-10-CM | POA: Insufficient documentation

## 2022-07-27 DIAGNOSIS — Z85528 Personal history of other malignant neoplasm of kidney: Secondary | ICD-10-CM | POA: Diagnosis not present

## 2022-07-27 DIAGNOSIS — N816 Rectocele: Secondary | ICD-10-CM | POA: Insufficient documentation

## 2022-07-27 DIAGNOSIS — Z905 Acquired absence of kidney: Secondary | ICD-10-CM | POA: Diagnosis not present

## 2022-07-27 DIAGNOSIS — N183 Chronic kidney disease, stage 3 unspecified: Secondary | ICD-10-CM | POA: Diagnosis not present

## 2022-07-27 DIAGNOSIS — F17200 Nicotine dependence, unspecified, uncomplicated: Secondary | ICD-10-CM | POA: Diagnosis not present

## 2022-07-27 DIAGNOSIS — E1122 Type 2 diabetes mellitus with diabetic chronic kidney disease: Secondary | ICD-10-CM | POA: Insufficient documentation

## 2022-07-27 DIAGNOSIS — Z955 Presence of coronary angioplasty implant and graft: Secondary | ICD-10-CM | POA: Insufficient documentation

## 2022-07-27 DIAGNOSIS — N200 Calculus of kidney: Secondary | ICD-10-CM | POA: Diagnosis not present

## 2022-07-27 DIAGNOSIS — I2510 Atherosclerotic heart disease of native coronary artery without angina pectoris: Secondary | ICD-10-CM | POA: Insufficient documentation

## 2022-07-27 HISTORY — DX: Palpitations: R00.2

## 2022-07-27 HISTORY — DX: Chronic kidney disease, unspecified: N18.9

## 2022-07-27 LAB — BASIC METABOLIC PANEL
Anion gap: 10 (ref 5–15)
BUN: 59 mg/dL — ABNORMAL HIGH (ref 8–23)
CO2: 19 mmol/L — ABNORMAL LOW (ref 22–32)
Calcium: 8.5 mg/dL — ABNORMAL LOW (ref 8.9–10.3)
Chloride: 105 mmol/L (ref 98–111)
Creatinine, Ser: 2.72 mg/dL — ABNORMAL HIGH (ref 0.44–1.00)
GFR, Estimated: 19 mL/min — ABNORMAL LOW (ref >60)
Glucose, Bld: 246 mg/dL — ABNORMAL HIGH (ref 70–99)
Potassium: 5 mmol/L (ref 3.5–5.1)
Sodium: 134 mmol/L — ABNORMAL LOW (ref 135–145)

## 2022-07-27 LAB — CBC
HCT: 34.5 % — ABNORMAL LOW (ref 36.0–46.0)
Hemoglobin: 10.9 g/dL — ABNORMAL LOW (ref 12.0–15.0)
MCH: 28.8 pg (ref 26.0–34.0)
MCHC: 31.6 g/dL (ref 30.0–36.0)
MCV: 91.3 fL (ref 80.0–100.0)
Platelets: 238 10*3/uL (ref 150–400)
RBC: 3.78 MIL/uL — ABNORMAL LOW (ref 3.87–5.11)
RDW: 14.7 % (ref 11.5–15.5)
WBC: 9.5 10*3/uL (ref 4.0–10.5)
nRBC: 0 % (ref 0.0–0.2)

## 2022-07-27 LAB — HEMOGLOBIN A1C
Hgb A1c MFr Bld: 9.5 % — ABNORMAL HIGH (ref 4.8–5.6)
Mean Plasma Glucose: 225.95 mg/dL

## 2022-07-27 LAB — GLUCOSE, CAPILLARY: Glucose-Capillary: 244 mg/dL — ABNORMAL HIGH (ref 70–99)

## 2022-07-27 NOTE — Progress Notes (Addendum)
PCP - Dr. Elfredia Nevins Cardiologist - Dr.Johnathan Branch  clearance 06-08-22 epic  PPM/ICD -  Device Orders -  Rep Notified -   Chest x-ray -  EKG - 04-30-22 Stress Test -  ECHO - 06-02-22 Cardiac Cath - 2018  Sleep Study -  CPAP -   Fasting Blood Sugar - 150-180 Checks Blood Sugar ___2__ times a day  Blood Thinner Instructions: Aspirin Instructions:  ERAS Protcol - PRE-SURGERY G2-    COVID vaccine -no  Activity--Able to complete ADL's without SOB or CP Anesthesia review: CAD stent, DM, HTN, L neprhectomy .TIA, wears o2  2L as needed, creatine 2.72, HgbA1c 9.5  Patient denies shortness of breath, fever, cough and chest pain at PAT appointment   All instructions explained to the patient, with a verbal understanding of the material. Patient agrees to go over the instructions while at home for a better understanding. Patient also instructed to self quarantine after being tested for COVID-19. The opportunity to ask questions was provided.

## 2022-07-28 NOTE — Progress Notes (Addendum)
Case: [id number] Date/Time: 08/05/22 0715   Procedure: LAPAROSCOPIC VENTRAL WALL HERNIA REPAIR   Anesthesia type: General   Pre-op diagnosis: FLANK INCISIONAL ABDOMINAL WALL HERNIAE   Location: [facility] ROOM 01 / [facility]   Surgeons: Karie Soda, MD       DISCUSSION: Grace Hess is a 67 year old female who presents to PAT clinic prior to surgery listed above.  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  She underwent a left nephrectomy on 11/25/2021 and has been followed by oncology for surveillance.  She developed an incisional hernia and is now scheduled for repair of this.  Other PMH significant for CAD (s/p DES to RCA in 07/2016 with residual mid-LAD stenosis), PAD, carotid artery stenosis (s/p L CEA in 2016), HTN, HLD, hx of TIA, CKD stage III, uncontrolled Type 2 DM and current every day smoker.  No prior anesthesia complications however patient was deemed high risk for her nephrectomy.  Patient last saw cardiology on 06/08/2022.  Was noted to have intermittent shortness of breath but otherwise was stable. Cardiac clearance as follows:   "Preoperative Cardiac Clearance for Hernia Repair - She is able to perform 4 METS of activity without anginal symptoms and RCRI risk is at 6.6% risk of a major cardiac event. Recent echocardiogram was reassuring as outlined above. At this time, she is of acceptable risk to proceed without the need for further cardiac testing. Will forward today's note to the requesting provider's office (Dr. Michaell Cowing -  [phone] Attn: Ethlyn Gallery, CMA). She can hold Plavix for 5 days prior to surgery as previously discussed given no recent intervention."  Of note patient was referred to Endocrinology and started on insulin on 5/13 due to HgA1c of 9.5   VS: BP 119/63   Pulse 62   Temp 36.8 C (Oral)   Resp 16   Ht 5\' 6"  (1.676 m)   Wt 61.7 kg   SpO2 97%   BMI 21.95 kg/m   PROVIDERS: Elfredia Nevins, MD as  PCP  Wyline Mood Dorothe Pea, MD - Cardiology Doreatha Massed, MD as Medical Oncologist Crist Fat, MD as Urology   LABS: HgA1c results routed to PCP and Dr. Michaell Cowing. SCr/BUN are elevated but at baseline. (all labs ordered are listed, but only abnormal results are displayed)  Labs Reviewed  HEMOGLOBIN A1C - Abnormal; Notable for the following components:      Result Value   Hgb A1c MFr Bld 9.5 (*)    All other components within normal limits  BASIC METABOLIC PANEL - Abnormal; Notable for the following components:   Sodium 134 (*)    CO2 19 (*)    Glucose, Bld 246 (*)    BUN 59 (*)    Creatinine, Ser 2.72 (*)    Calcium 8.5 (*)    GFR, Estimated 19 (*)    All other components within normal limits  CBC - Abnormal; Notable for the following components:   RBC 3.78 (*)    Hemoglobin 10.9 (*)    HCT 34.5 (*)    All other components within normal limits  GLUCOSE, CAPILLARY - Abnormal; Notable for the following components:   Glucose-Capillary 244 (*)    All other components within normal limits     IMAGES:  CT chest/abdomen/pelvis 07/05/22:  IMPRESSION: 1. Stable examination status post left nephrectomy without evidence of local recurrence or metastatic disease within the chest, abdomen, or pelvis. 2. Nonobstructive right renal stones measure up  to 5 mm. 3. Pelvic floor laxity with prolapsed appearance of the vagina and a small rectocele. 4. Left fat and nonobstructed small bowel containing Spigelian hernia. 5.  Aortic Atherosclerosis (ICD10-I70.0).    EKG 04/30/22:  Sinus bradycardia Appears similar to prior (except slower - patient was BB was d/c'd after this)   CV:  Echo 06/02/22:  IMPRESSIONS  1. Left ventricular ejection fraction, by estimation, is 60 to 65% . The left ventricle has normal function. The left ventricle has no regional wall motion abnormalities. Left ventricular diastolic parameters are indeterminate.  2. Right ventricular systolic  function is normal. The right ventricular size is normal. Tricuspid regurgitation signal is inadequate for assessing PA pressure.  3. Left atrial size was moderately dilated.  4. The mitral valve is grossly normal. No evidence of mitral valve regurgitation. No evidence of mitral stenosis. Moderate mitral annular calcification.  5. The aortic valve is tricuspid. There is mild calcification of the aortic valve. Aortic valve regurgitation is trivial. Aortic valve sclerosis/ calcification is present, without any evidence of aortic stenosis.  6. The inferior vena cava is normal in size with greater than 50% respiratory variability, suggesting right atrial pressure of 3 mmHg.  Carotid US 05/06/22:  Summary:  Right Carotid: Velocities in the right ICA are consistent with a 60-79% stenosis. The ECA appears >50% stenosed.   Left Carotid: Velocities in the left ICA are consistent with a 1-39%  stenosis. Hemodynamically significant plaque >50% visualized in the CCA. Patent left endarterectomy site.   Vertebrals:  Bilateral vertebral arteries demonstrate antegrade flow.  Subclavians: Bilateral subclavian artery flow was disturbed.   *See table(s) above for measurements and observations.  Suggest follow up study in 12 months.    Past Medical History:  Diagnosis Date   Anxiety    CAD (coronary artery disease)    a. s/p DES to RCA in 07/2016 with residual mid-LAD stenosis   Cancer (HCC)    skin cancer on finger,, cancer left kidney   Cervicalgia    Chest pain, unspecified    Chronic kidney disease    Depression    GERD (gastroesophageal reflux disease)    Headache    "bad ones after my stroke in 2016; only have them when I get bad news now" (08/02/2016)   Heart murmur    Heavy smoker (more than 20 cigarettes per day)    Scheduled for LDCT screening 02/11/15   History of hiatal hernia    History of kidney stones    "no cysto/OR" (12/10/2014)   Hypercholesteremia    Hypertension    Obesity     Palpitations    Reflux esophagitis    Stroke [facility])    "they said I had a  MINI stroke a few months back/MRI" (12/10/2014)   TIA (transient ischemic attack) 11/28/2014   [name] 11/28/2014   Type II diabetes mellitus (HCC)     Past Surgical History:  Procedure Laterality Date   ABDOMINAL AORTOGRAM N/A 08/02/2016   Procedure: Abdominal Aortogram;  Surgeon: Yvonne Kendall, MD;  [facility];  Service: Cardiovascular;  Laterality: N/A;   CAROTID STENT     CHOLECYSTECTOMY OPEN  1978   COLONOSCOPY  2010   single diverticulum in sigmoid colon   CORONARY ANGIOPLASTY WITH STENT PLACEMENT  08/02/2016   to RCA   CORONARY STENT INTERVENTION N/A 08/02/2016   Procedure: Coronary Stent Intervention;  Surgeon: Yvonne Kendall, MD;  [facility]: [facility];  Service: Cardiovascular;  Laterality: N/A;  RCA   CYSTOSCOPY W/ RETROGRADES Bilateral 07/15/2021   Procedure: CYSTOSCOPY WITH RETROGRADE PYELOGRAM;  Surgeon: Milderd Meager., MD;  Location: [facility];  Service: Urology;  Laterality: Bilateral;   CYSTOSCOPY WITH STENT PLACEMENT Left 07/15/2021   Procedure: CYSTOSCOPY WITH STENT PLACEMENT;  Surgeon: Milderd Meager., MD;  Location: [facility];  Service: Urology;  Laterality: Left;   ENDARTERECTOMY Left 12/02/2014   Procedure: ENDARTERECTOMY CAROTID;  Surgeon: Larina Earthly, MD;  Location[facility];  Service: Vascular;  Laterality: Left;   LAPAROSCOPIC NEPHRECTOMY Left    LAPAROSCOPIC NEPHRECTOMY Left 11/25/2021   Procedure: LEFT LAPAROSCOPIC RADICAL NEPHRECTOMY;  Surgeon: Crist Fat, MD;  Location[facility];  Service: Urology;  Laterality: Left;   LEFT HEART CATH AND CORONARY ANGIOGRAPHY N/A 08/02/2016   Procedure: Left Heart Cath and Coronary Angiography;  Surgeon: Yvonne Kendall, MD;  [facility];  Service: Cardiovascular;  Laterality: N/A;   LOWER EXTREMITY ANGIOGRAM  08/02/2016   LOWER EXTREMITY ANGIOGRAPHY N/A 08/02/2016   Procedure: Lower  Extremity Angiography;  Surgeon: Yvonne Kendall, MD;  [facility];  Service: Cardiovascular;  Laterality: N/A;   NEPHRECTOMY Left    VAGINAL HYSTERECTOMY  1991   "partial"    MEDICATIONS:  acetaminophen (TYLENOL) 325 MG tablet   albuterol (VENTOLIN HFA) 108 (90 Base) MCG/ACT inhaler   ALPRAZolam (XANAX) 0.5 MG tablet   amLODipine (NORVASC) 5 MG tablet   Biotin 5000 MCG CAPS   Blood Glucose Monitoring Suppl (ACCU-CHEK GUIDE) w/Device KIT   calcitRIOL (ROCALTROL) 0.25 MCG capsule   calcium acetate (PHOSLO) 667 MG tablet   Cholecalciferol (VITAMIN D3) 125 MCG (5000 UT) CAPS   clopidogrel (PLAVIX) 75 MG tablet   escitalopram (LEXAPRO) 20 MG tablet   gabapentin (NEURONTIN) 300 MG capsule   glucose blood (ACCU-CHEK GUIDE) test strip   insulin degludec (TRESIBA FLEXTOUCH) 100 UNIT/ML FlexTouch Pen   Insulin Pen Needle (PEN NEEDLES) 31G X 6 MM MISC   LOKELMA 10 g PACK packet   metoprolol tartrate (LOPRESSOR) 25 MG tablet   nicotine (NICODERM CQ - DOSED IN MG/24 HOURS) 21 mg/24hr patch   nystatin cream (MYCOSTATIN)   ondansetron (ZOFRAN) 4 MG tablet   oxyCODONE-acetaminophen (PERCOCET/ROXICET) 5-325 MG tablet   pantoprazole (PROTONIX) 40 MG tablet   promethazine (PHENERGAN) 25 MG tablet   rosuvastatin (CRESTOR) 20 MG tablet   sodium bicarbonate 650 MG tablet   venlafaxine (EFFEXOR) 25 MG tablet   No current facility-administered medications for this encounter.   Marcille Blanco [facility] Short Stay/Anesthesiology [facility] Phone [phone] 07/28/2022 3:30 PM

## 2022-07-29 ENCOUNTER — Other Ambulatory Visit (INDEPENDENT_AMBULATORY_CARE_PROVIDER_SITE_OTHER): Payer: Self-pay | Admitting: Gastroenterology

## 2022-07-29 ENCOUNTER — Telehealth: Payer: Self-pay | Admitting: *Deleted

## 2022-07-29 ENCOUNTER — Other Ambulatory Visit (INDEPENDENT_AMBULATORY_CARE_PROVIDER_SITE_OTHER): Payer: Self-pay | Admitting: *Deleted

## 2022-07-29 MED ORDER — PANTOPRAZOLE SODIUM 40 MG PO TBEC: 40.0000 mg | DELAYED_RELEASE_TABLET | Freq: Every day | ORAL | 0 refills | Status: DC

## 2022-07-29 NOTE — Telephone Encounter (Signed)
Patient left a message that she had checked her blood sugars this morning and it was 84. She took no medication. She says that she ate a good breakfast. She noticed that she got real shaky.  Before leaving a message at 3:48 pm she checked her blood sugar and it read 574.  She ate a good lunch at the [employer] in [place], which was [employer] She mentions that she ate potatoes and corn.  Discussed with Dr. Fransico Him. He recommended that the patient inject 15 units now of the [place], give some time then recheck blood sugar. If it was 400 or read high she was to go to the ED for further evaulation, as there may be something going on. If the blood sugar comes down the patient is to take her Tresiba 15 units at bedtime.  Patient was called and made aware.

## 2022-08-05 ENCOUNTER — Other Ambulatory Visit: Payer: Self-pay

## 2022-08-05 ENCOUNTER — Encounter (HOSPITAL_COMMUNITY): Payer: Self-pay | Admitting: Surgery

## 2022-08-05 ENCOUNTER — Inpatient Hospital Stay (HOSPITAL_COMMUNITY): Payer: PPO | Admitting: Medical

## 2022-08-05 ENCOUNTER — Inpatient Hospital Stay (HOSPITAL_COMMUNITY): Payer: PPO | Admitting: Anesthesiology

## 2022-08-05 ENCOUNTER — Encounter (HOSPITAL_COMMUNITY)
Admission: RE | Disposition: A | Discharge status: Home / Self Care | Payer: Self-pay | Source: Home / Self Care | Attending: Surgery

## 2022-08-05 ENCOUNTER — Inpatient Hospital Stay (HOSPITAL_COMMUNITY)
Admission: RE | Admit: 2022-08-05 | Discharge: 2022-08-06 | DRG: 355 | Disposition: A | Discharge status: Home / Self Care | Payer: PPO | Attending: Surgery | Admitting: Surgery

## 2022-08-05 DIAGNOSIS — Z794 Long term (current) use of insulin: Secondary | ICD-10-CM | POA: Diagnosis not present

## 2022-08-05 DIAGNOSIS — Z79899 Other long term (current) drug therapy: Secondary | ICD-10-CM | POA: Diagnosis not present

## 2022-08-05 DIAGNOSIS — K432 Incisional hernia without obstruction or gangrene: Secondary | ICD-10-CM | POA: Diagnosis not present

## 2022-08-05 DIAGNOSIS — F419 Anxiety disorder, unspecified: Secondary | ICD-10-CM | POA: Diagnosis present

## 2022-08-05 DIAGNOSIS — Z7901 Long term (current) use of anticoagulants: Secondary | ICD-10-CM

## 2022-08-05 DIAGNOSIS — E1151 Type 2 diabetes mellitus with diabetic peripheral angiopathy without gangrene: Secondary | ICD-10-CM | POA: Diagnosis not present

## 2022-08-05 DIAGNOSIS — Z85528 Personal history of other malignant neoplasm of kidney: Secondary | ICD-10-CM | POA: Diagnosis not present

## 2022-08-05 DIAGNOSIS — E119 Type 2 diabetes mellitus without complications: Secondary | ICD-10-CM

## 2022-08-05 DIAGNOSIS — Z905 Acquired absence of kidney: Secondary | ICD-10-CM

## 2022-08-05 DIAGNOSIS — Z808 Family history of malignant neoplasm of other organs or systems: Secondary | ICD-10-CM

## 2022-08-05 DIAGNOSIS — Z87442 Personal history of urinary calculi: Secondary | ICD-10-CM

## 2022-08-05 DIAGNOSIS — E559 Vitamin D deficiency, unspecified: Secondary | ICD-10-CM | POA: Diagnosis present

## 2022-08-05 DIAGNOSIS — G8929 Other chronic pain: Secondary | ICD-10-CM | POA: Diagnosis present

## 2022-08-05 DIAGNOSIS — Z7902 Long term (current) use of antithrombotics/antiplatelets: Secondary | ICD-10-CM

## 2022-08-05 DIAGNOSIS — F32A Depression, unspecified: Secondary | ICD-10-CM | POA: Diagnosis present

## 2022-08-05 DIAGNOSIS — I129 Hypertensive chronic kidney disease with stage 1 through stage 4 chronic kidney disease, or unspecified chronic kidney disease: Secondary | ICD-10-CM

## 2022-08-05 DIAGNOSIS — Z955 Presence of coronary angioplasty implant and graft: Secondary | ICD-10-CM

## 2022-08-05 DIAGNOSIS — N189 Chronic kidney disease, unspecified: Secondary | ICD-10-CM | POA: Diagnosis not present

## 2022-08-05 DIAGNOSIS — Z8249 Family history of ischemic heart disease and other diseases of the circulatory system: Secondary | ICD-10-CM

## 2022-08-05 DIAGNOSIS — Z8673 Personal history of transient ischemic attack (TIA), and cerebral infarction without residual deficits: Secondary | ICD-10-CM

## 2022-08-05 DIAGNOSIS — Z882 Allergy status to sulfonamides status: Secondary | ICD-10-CM

## 2022-08-05 DIAGNOSIS — E1122 Type 2 diabetes mellitus with diabetic chronic kidney disease: Secondary | ICD-10-CM | POA: Diagnosis present

## 2022-08-05 DIAGNOSIS — Z91040 Latex allergy status: Secondary | ICD-10-CM

## 2022-08-05 DIAGNOSIS — I2510 Atherosclerotic heart disease of native coronary artery without angina pectoris: Secondary | ICD-10-CM | POA: Diagnosis present

## 2022-08-05 DIAGNOSIS — Z833 Family history of diabetes mellitus: Secondary | ICD-10-CM

## 2022-08-05 DIAGNOSIS — K430 Incisional hernia with obstruction, without gangrene: Secondary | ICD-10-CM

## 2022-08-05 DIAGNOSIS — D631 Anemia in chronic kidney disease: Secondary | ICD-10-CM

## 2022-08-05 DIAGNOSIS — F17200 Nicotine dependence, unspecified, uncomplicated: Secondary | ICD-10-CM | POA: Diagnosis not present

## 2022-08-05 DIAGNOSIS — Z79891 Long term (current) use of opiate analgesic: Secondary | ICD-10-CM

## 2022-08-05 DIAGNOSIS — K2100 Gastro-esophageal reflux disease with esophagitis, without bleeding: Secondary | ICD-10-CM | POA: Diagnosis not present

## 2022-08-05 DIAGNOSIS — Z7982 Long term (current) use of aspirin: Secondary | ICD-10-CM | POA: Diagnosis not present

## 2022-08-05 DIAGNOSIS — E782 Mixed hyperlipidemia: Secondary | ICD-10-CM | POA: Diagnosis present

## 2022-08-05 DIAGNOSIS — N1832 Chronic kidney disease, stage 3b: Secondary | ICD-10-CM | POA: Diagnosis not present

## 2022-08-05 DIAGNOSIS — Z90711 Acquired absence of uterus with remaining cervical stump: Secondary | ICD-10-CM

## 2022-08-05 DIAGNOSIS — Z885 Allergy status to narcotic agent status: Secondary | ICD-10-CM

## 2022-08-05 DIAGNOSIS — Z7984 Long term (current) use of oral hypoglycemic drugs: Secondary | ICD-10-CM

## 2022-08-05 DIAGNOSIS — F17210 Nicotine dependence, cigarettes, uncomplicated: Secondary | ICD-10-CM | POA: Diagnosis not present

## 2022-08-05 DIAGNOSIS — Z85828 Personal history of other malignant neoplasm of skin: Secondary | ICD-10-CM

## 2022-08-05 HISTORY — DX: Incisional hernia with obstruction, without gangrene: K43.0

## 2022-08-05 HISTORY — PX: VENTRAL HERNIA REPAIR: SHX424

## 2022-08-05 LAB — GLUCOSE, CAPILLARY
Glucose-Capillary: 150 mg/dL — ABNORMAL HIGH (ref 70–99)
Glucose-Capillary: 218 mg/dL — ABNORMAL HIGH (ref 70–99)
Glucose-Capillary: 131 mg/dL — ABNORMAL HIGH (ref 70–99)
Glucose-Capillary: 136 mg/dL — ABNORMAL HIGH (ref 70–99)
Glucose-Capillary: 173 mg/dL — ABNORMAL HIGH (ref 70–99)

## 2022-08-05 SURGERY — REPAIR, HERNIA, VENTRAL, LAPAROSCOPIC
Anesthesia: General

## 2022-08-05 MED ORDER — CHLORHEXIDINE GLUCONATE 0.12 % MT SOLN
15.0000 mL | Freq: Once | OROMUCOSAL | Status: AC
  Administered 2022-08-05: 15 mL via OROMUCOSAL

## 2022-08-05 MED ORDER — MAGIC MOUTHWASH: 15.0000 mL | Freq: Four times a day (QID) | ORAL | Status: DC | PRN

## 2022-08-05 MED ORDER — ONDANSETRON HCL 4 MG/2ML IJ SOLN: 4.0000 mg | Freq: Four times a day (QID) | INTRAMUSCULAR | Status: DC | PRN

## 2022-08-05 MED ORDER — SODIUM BICARBONATE 650 MG PO TABS
650.0000 mg | ORAL_TABLET | Freq: Two times a day (BID) | ORAL | Status: DC
  Administered 2022-08-05 – 2022-08-06 (×2): 650 mg via ORAL
  Filled 2022-08-05 (×2): qty 1

## 2022-08-05 MED ORDER — BISACODYL 10 MG RE SUPP: 10.0000 mg | Freq: Every day | RECTAL | Status: DC | PRN

## 2022-08-05 MED ORDER — PANTOPRAZOLE SODIUM 40 MG PO TBEC
40.0000 mg | DELAYED_RELEASE_TABLET | Freq: Every day | ORAL | Status: DC
  Administered 2022-08-05 – 2022-08-06 (×2): 40 mg via ORAL
  Filled 2022-08-05 (×2): qty 1

## 2022-08-05 MED ORDER — SUGAMMADEX SODIUM 200 MG/2ML IV SOLN
INTRAVENOUS | Status: DC | PRN
  Administered 2022-08-05: 150 mg via INTRAVENOUS

## 2022-08-05 MED ORDER — MIDAZOLAM HCL 2 MG/2ML IJ SOLN
INTRAMUSCULAR | Status: AC
  Filled 2022-08-05: qty 2

## 2022-08-05 MED ORDER — PHENOL 1.4 % MT LIQD: 2.0000 | OROMUCOSAL | Status: DC | PRN

## 2022-08-05 MED ORDER — SIMETHICONE 80 MG PO CHEW: 40.0000 mg | CHEWABLE_TABLET | Freq: Four times a day (QID) | ORAL | Status: DC | PRN

## 2022-08-05 MED ORDER — INSULIN ASPART 100 UNIT/ML IJ SOLN
INTRAMUSCULAR | Status: AC
  Filled 2022-08-05: qty 1

## 2022-08-05 MED ORDER — ALUM & MAG HYDROXIDE-SIMETH 200-200-20 MG/5ML PO SUSP: 30.0000 mL | Freq: Four times a day (QID) | ORAL | Status: DC | PRN

## 2022-08-05 MED ORDER — INSULIN GLARGINE-YFGN 100 UNIT/ML ~~LOC~~ SOLN
15.0000 [IU] | Freq: Two times a day (BID) | SUBCUTANEOUS | Status: DC
  Administered 2022-08-05 – 2022-08-06 (×2): 15 [IU] via SUBCUTANEOUS
  Filled 2022-08-05 (×2): qty 0.15

## 2022-08-05 MED ORDER — INSULIN ASPART 100 UNIT/ML IJ SOLN
0.0000 [IU] | Freq: Every day | INTRAMUSCULAR | Status: DC
  Administered 2022-08-05: 2 [IU] via SUBCUTANEOUS

## 2022-08-05 MED ORDER — BUPIVACAINE-EPINEPHRINE 0.25% -1:200000 IJ SOLN
INTRAMUSCULAR | Status: DC | PRN
  Administered 2022-08-05: 50 mL

## 2022-08-05 MED ORDER — FENTANYL CITRATE (PF) 100 MCG/2ML IJ SOLN
INTRAMUSCULAR | Status: AC
  Filled 2022-08-05: qty 2

## 2022-08-05 MED ORDER — EPHEDRINE 5 MG/ML INJ
INTRAVENOUS | Status: AC
  Filled 2022-08-05: qty 5

## 2022-08-05 MED ORDER — SODIUM CHLORIDE 0.9 % IV SOLN: 250.0000 mL | INTRAVENOUS | Status: DC | PRN

## 2022-08-05 MED ORDER — ONDANSETRON HCL 4 MG/2ML IJ SOLN
INTRAMUSCULAR | Status: AC
  Filled 2022-08-05: qty 2

## 2022-08-05 MED ORDER — ONDANSETRON 4 MG PO TBDP: 4.0000 mg | ORAL_TABLET | Freq: Four times a day (QID) | ORAL | Status: DC | PRN

## 2022-08-05 MED ORDER — SODIUM CHLORIDE 0.9% FLUSH
3.0000 mL | Freq: Two times a day (BID) | INTRAVENOUS | Status: DC
  Administered 2022-08-05 – 2022-08-06 (×2): 3 mL via INTRAVENOUS

## 2022-08-05 MED ORDER — CALCIUM ACETATE (PHOS BINDER) 667 MG PO TABS: 667.0000 mg | ORAL_TABLET | Freq: Every day | ORAL | Status: DC

## 2022-08-05 MED ORDER — ENSURE PRE-SURGERY PO LIQD: 592.0000 mL | Freq: Once | ORAL | Status: DC

## 2022-08-05 MED ORDER — PROPOFOL 10 MG/ML IV BOLUS
INTRAVENOUS | Status: AC
  Filled 2022-08-05: qty 20

## 2022-08-05 MED ORDER — AMISULPRIDE (ANTIEMETIC) 5 MG/2ML IV SOLN: 5.0000 mg | Freq: Once | INTRAVENOUS | Status: DC | PRN

## 2022-08-05 MED ORDER — CHLORHEXIDINE GLUCONATE CLOTH 2 % EX PADS: 6.0000 | MEDICATED_PAD | Freq: Once | CUTANEOUS | Status: DC

## 2022-08-05 MED ORDER — HYDROMORPHONE HCL 1 MG/ML IJ SOLN
0.5000 mg | INTRAMUSCULAR | Status: DC | PRN
  Administered 2022-08-05: 1 mg via INTRAVENOUS
  Filled 2022-08-05: qty 1

## 2022-08-05 MED ORDER — VITAMIN D3 125 MCG (5000 UT) PO CAPS: 5000.0000 [IU] | ORAL_CAPSULE | ORAL | Status: DC

## 2022-08-05 MED ORDER — DEXAMETHASONE SODIUM PHOSPHATE 10 MG/ML IJ SOLN
INTRAMUSCULAR | Status: DC | PRN
  Administered 2022-08-05: 4 mg via INTRAVENOUS

## 2022-08-05 MED ORDER — BUPIVACAINE LIPOSOME 1.3 % IJ SUSP
INTRAMUSCULAR | Status: AC
  Filled 2022-08-05: qty 20

## 2022-08-05 MED ORDER — GABAPENTIN 100 MG PO CAPS
300.0000 mg | ORAL_CAPSULE | Freq: Three times a day (TID) | ORAL | Status: DC
  Administered 2022-08-05 – 2022-08-06 (×3): 300 mg via ORAL
  Filled 2022-08-05 (×3): qty 3

## 2022-08-05 MED ORDER — ROSUVASTATIN CALCIUM 20 MG PO TABS
20.0000 mg | ORAL_TABLET | Freq: Every day | ORAL | Status: DC
  Administered 2022-08-05 – 2022-08-06 (×2): 20 mg via ORAL
  Filled 2022-08-05 (×2): qty 1

## 2022-08-05 MED ORDER — MENTHOL 3 MG MT LOZG: 1.0000 | LOZENGE | OROMUCOSAL | Status: DC | PRN

## 2022-08-05 MED ORDER — CALCIUM POLYCARBOPHIL 625 MG PO TABS
625.0000 mg | ORAL_TABLET | Freq: Two times a day (BID) | ORAL | Status: DC
  Administered 2022-08-05 – 2022-08-06 (×2): 625 mg via ORAL
  Filled 2022-08-05 (×2): qty 1

## 2022-08-05 MED ORDER — DEXAMETHASONE SODIUM PHOSPHATE 10 MG/ML IJ SOLN
INTRAMUSCULAR | Status: AC
  Filled 2022-08-05: qty 1

## 2022-08-05 MED ORDER — ROCURONIUM BROMIDE 10 MG/ML (PF) SYRINGE
PREFILLED_SYRINGE | INTRAVENOUS | Status: AC
  Filled 2022-08-05: qty 10

## 2022-08-05 MED ORDER — POLYETHYLENE GLYCOL 3350 17 G PO PACK: 17.0000 g | PACK | Freq: Two times a day (BID) | ORAL | Status: DC | PRN

## 2022-08-05 MED ORDER — SIMETHICONE 40 MG/0.6ML PO SUSP: 80.0000 mg | Freq: Four times a day (QID) | ORAL | Status: DC | PRN

## 2022-08-05 MED ORDER — IPRATROPIUM-ALBUTEROL 0.5-2.5 (3) MG/3ML IN SOLN
3.0000 mL | Freq: Once | RESPIRATORY_TRACT | Status: AC
  Administered 2022-08-05: 3 mL via RESPIRATORY_TRACT

## 2022-08-05 MED ORDER — BIOTIN 5000 MCG PO CAPS: 5000.0000 ug | ORAL_CAPSULE | Freq: Every day | ORAL | Status: DC

## 2022-08-05 MED ORDER — FENTANYL CITRATE PF 50 MCG/ML IJ SOSY
PREFILLED_SYRINGE | INTRAMUSCULAR | Status: AC
  Administered 2022-08-05: 50 ug via INTRAVENOUS
  Filled 2022-08-05: qty 2

## 2022-08-05 MED ORDER — LIDOCAINE 2% (20 MG/ML) 5 ML SYRINGE
INTRAMUSCULAR | Status: DC | PRN
  Administered 2022-08-05: 60 mg via INTRAVENOUS

## 2022-08-05 MED ORDER — OXYCODONE-ACETAMINOPHEN 5-325 MG PO TABS: 1.0000 | ORAL_TABLET | Freq: Four times a day (QID) | ORAL | 0 refills | Status: AC | PRN

## 2022-08-05 MED ORDER — PROCHLORPERAZINE EDISYLATE 10 MG/2ML IJ SOLN: 5.0000 mg | Freq: Four times a day (QID) | INTRAMUSCULAR | Status: DC | PRN

## 2022-08-05 MED ORDER — ALBUTEROL SULFATE (2.5 MG/3ML) 0.083% IN NEBU: 2.5000 mg | INHALATION_SOLUTION | RESPIRATORY_TRACT | Status: DC | PRN

## 2022-08-05 MED ORDER — CALCIUM ACETATE (PHOS BINDER) 667 MG PO CAPS
667.0000 mg | ORAL_CAPSULE | Freq: Every day | ORAL | Status: DC
  Administered 2022-08-05: 667 mg via ORAL
  Filled 2022-08-05: qty 1

## 2022-08-05 MED ORDER — DIPHENHYDRAMINE HCL 50 MG/ML IJ SOLN: 12.5000 mg | Freq: Four times a day (QID) | INTRAMUSCULAR | Status: DC | PRN

## 2022-08-05 MED ORDER — PROPOFOL 10 MG/ML IV BOLUS
INTRAVENOUS | Status: DC | PRN
  Administered 2022-08-05: 100 mg via INTRAVENOUS

## 2022-08-05 MED ORDER — DIPHENHYDRAMINE HCL 12.5 MG/5ML PO ELIX: 12.5000 mg | ORAL_SOLUTION | Freq: Four times a day (QID) | ORAL | Status: DC | PRN

## 2022-08-05 MED ORDER — LIDOCAINE HCL (PF) 2 % IJ SOLN
INTRAMUSCULAR | Status: AC
  Filled 2022-08-05: qty 5

## 2022-08-05 MED ORDER — LACTATED RINGERS IV SOLN: INTRAVENOUS | Status: DC

## 2022-08-05 MED ORDER — METHOCARBAMOL 1000 MG/10ML IJ SOLN: 1000.0000 mg | Freq: Four times a day (QID) | INTRAVENOUS | Status: DC | PRN

## 2022-08-05 MED ORDER — NICOTINE 14 MG/24HR TD PT24
14.0000 mg | MEDICATED_PATCH | TRANSDERMAL | Status: DC
  Administered 2022-08-05: 14 mg via TRANSDERMAL
  Filled 2022-08-05: qty 1

## 2022-08-05 MED ORDER — ACETAMINOPHEN 500 MG PO TABS
1000.0000 mg | ORAL_TABLET | Freq: Four times a day (QID) | ORAL | Status: DC
  Administered 2022-08-05 – 2022-08-06 (×3): 1000 mg via ORAL
  Filled 2022-08-05 (×4): qty 2

## 2022-08-05 MED ORDER — PROMETHAZINE HCL 25 MG PO TABS: 25.0000 mg | ORAL_TABLET | Freq: Four times a day (QID) | ORAL | Status: DC | PRN

## 2022-08-05 MED ORDER — PROCHLORPERAZINE MALEATE 10 MG PO TABS: 10.0000 mg | ORAL_TABLET | Freq: Four times a day (QID) | ORAL | Status: DC | PRN

## 2022-08-05 MED ORDER — VENLAFAXINE HCL 25 MG PO TABS
25.0000 mg | ORAL_TABLET | Freq: Two times a day (BID) | ORAL | Status: DC
  Administered 2022-08-05 – 2022-08-06 (×2): 25 mg via ORAL
  Filled 2022-08-05 (×2): qty 1

## 2022-08-05 MED ORDER — METHOCARBAMOL 500 MG PO TABS
1000.0000 mg | ORAL_TABLET | Freq: Four times a day (QID) | ORAL | Status: DC | PRN
  Administered 2022-08-06: 1000 mg via ORAL
  Filled 2022-08-05: qty 2

## 2022-08-05 MED ORDER — SODIUM CHLORIDE 0.9 % IV SOLN: INTRAVENOUS | Status: DC

## 2022-08-05 MED ORDER — IPRATROPIUM-ALBUTEROL 0.5-2.5 (3) MG/3ML IN SOLN
RESPIRATORY_TRACT | Status: AC
  Filled 2022-08-05: qty 3

## 2022-08-05 MED ORDER — ACETAMINOPHEN 500 MG PO TABS
1000.0000 mg | ORAL_TABLET | ORAL | Status: AC
  Administered 2022-08-05: 1000 mg via ORAL
  Filled 2022-08-05: qty 2

## 2022-08-05 MED ORDER — ROCURONIUM BROMIDE 10 MG/ML (PF) SYRINGE
PREFILLED_SYRINGE | INTRAVENOUS | Status: DC | PRN
  Administered 2022-08-05: 50 mg via INTRAVENOUS
  Administered 2022-08-05: 20 mg via INTRAVENOUS

## 2022-08-05 MED ORDER — ENSURE PRE-SURGERY PO LIQD: 296.0000 mL | Freq: Once | ORAL | Status: DC

## 2022-08-05 MED ORDER — ORAL CARE MOUTH RINSE: 15.0000 mL | Freq: Once | OROMUCOSAL | Status: AC

## 2022-08-05 MED ORDER — CALCITRIOL 0.25 MCG PO CAPS
0.2500 ug | ORAL_CAPSULE | Freq: Every day | ORAL | Status: DC
  Administered 2022-08-05 – 2022-08-06 (×2): 0.25 ug via ORAL
  Filled 2022-08-05 (×2): qty 1

## 2022-08-05 MED ORDER — VITAMIN D 25 MCG (1000 UNIT) PO TABS: 5000.0000 [IU] | ORAL_TABLET | ORAL | Status: DC

## 2022-08-05 MED ORDER — OXYCODONE HCL 5 MG PO TABS
5.0000 mg | ORAL_TABLET | ORAL | Status: DC | PRN
  Administered 2022-08-05 – 2022-08-06 (×2): 10 mg via ORAL
  Administered 2022-08-06: 5 mg via ORAL
  Filled 2022-08-05 (×3): qty 2

## 2022-08-05 MED ORDER — BUPIVACAINE LIPOSOME 1.3 % IJ SUSP
INTRAMUSCULAR | Status: DC | PRN
  Administered 2022-08-05: 20 mL

## 2022-08-05 MED ORDER — METHOCARBAMOL 500 MG PO TABS: 500.0000 mg | ORAL_TABLET | Freq: Four times a day (QID) | ORAL | Status: DC | PRN

## 2022-08-05 MED ORDER — ALBUTEROL SULFATE HFA 108 (90 BASE) MCG/ACT IN AERS: 1.0000 | INHALATION_SPRAY | RESPIRATORY_TRACT | Status: DC | PRN

## 2022-08-05 MED ORDER — METOPROLOL TARTRATE 5 MG/5ML IV SOLN: 5.0000 mg | Freq: Four times a day (QID) | INTRAVENOUS | Status: DC | PRN

## 2022-08-05 MED ORDER — MAGNESIUM HYDROXIDE 400 MG/5ML PO SUSP: 30.0000 mL | Freq: Every day | ORAL | Status: DC | PRN

## 2022-08-05 MED ORDER — ESCITALOPRAM OXALATE 20 MG PO TABS
20.0000 mg | ORAL_TABLET | Freq: Every day | ORAL | Status: DC
  Administered 2022-08-06: 20 mg via ORAL
  Filled 2022-08-05: qty 1

## 2022-08-05 MED ORDER — EPHEDRINE SULFATE-NACL 50-0.9 MG/10ML-% IV SOSY
PREFILLED_SYRINGE | INTRAVENOUS | Status: DC | PRN
  Administered 2022-08-05: 10 mg via INTRAVENOUS

## 2022-08-05 MED ORDER — HEPARIN SODIUM (PORCINE) 5000 UNIT/ML IJ SOLN
5000.0000 [IU] | Freq: Three times a day (TID) | INTRAMUSCULAR | Status: DC
  Administered 2022-08-05 – 2022-08-06 (×2): 5000 [IU] via SUBCUTANEOUS
  Filled 2022-08-05 (×2): qty 1

## 2022-08-05 MED ORDER — INSULIN ASPART 100 UNIT/ML IJ SOLN
0.0000 [IU] | Freq: Three times a day (TID) | INTRAMUSCULAR | Status: DC
  Administered 2022-08-05: 5 [IU] via SUBCUTANEOUS
  Administered 2022-08-05: 2 [IU] via SUBCUTANEOUS
  Administered 2022-08-05: 4 [IU] via SUBCUTANEOUS
  Administered 2022-08-06: 2 [IU] via SUBCUTANEOUS

## 2022-08-05 MED ORDER — SODIUM CHLORIDE 0.9% FLUSH: 3.0000 mL | INTRAVENOUS | Status: DC | PRN

## 2022-08-05 MED ORDER — INSULIN DEGLUDEC 100 UNIT/ML ~~LOC~~ SOPN: 15.0000 [IU] | PEN_INJECTOR | Freq: Two times a day (BID) | SUBCUTANEOUS | Status: DC

## 2022-08-05 MED ORDER — FENTANYL CITRATE PF 50 MCG/ML IJ SOSY
25.0000 ug | PREFILLED_SYRINGE | INTRAMUSCULAR | Status: DC | PRN
  Administered 2022-08-05: 50 ug via INTRAVENOUS

## 2022-08-05 MED ORDER — BUPIVACAINE LIPOSOME 1.3 % IJ SUSP: 20.0000 mL | Freq: Once | INTRAMUSCULAR | Status: DC

## 2022-08-05 MED ORDER — AMLODIPINE BESYLATE 5 MG PO TABS
5.0000 mg | ORAL_TABLET | Freq: Every day | ORAL | Status: DC
  Filled 2022-08-05: qty 1

## 2022-08-05 MED ORDER — LIP MEDEX EX OINT
TOPICAL_OINTMENT | Freq: Two times a day (BID) | CUTANEOUS | Status: DC
  Administered 2022-08-05: 75 via TOPICAL
  Filled 2022-08-05: qty 7

## 2022-08-05 MED ORDER — CEFAZOLIN SODIUM-DEXTROSE 2-4 GM/100ML-% IV SOLN
2.0000 g | INTRAVENOUS | Status: AC
  Administered 2022-08-05: 2 g via INTRAVENOUS
  Filled 2022-08-05: qty 100

## 2022-08-05 MED ORDER — GABAPENTIN 300 MG PO CAPS
300.0000 mg | ORAL_CAPSULE | ORAL | Status: DC
  Filled 2022-08-05: qty 1

## 2022-08-05 MED ORDER — ONDANSETRON HCL 4 MG/2ML IJ SOLN: 4.0000 mg | Freq: Once | INTRAMUSCULAR | Status: DC | PRN

## 2022-08-05 MED ORDER — FENTANYL CITRATE (PF) 100 MCG/2ML IJ SOLN
INTRAMUSCULAR | Status: DC | PRN
  Administered 2022-08-05 (×4): 50 ug via INTRAVENOUS

## 2022-08-05 MED ORDER — LACTATED RINGERS IV SOLN: INTRAVENOUS | Status: AC

## 2022-08-05 MED ORDER — CEFAZOLIN SODIUM-DEXTROSE 2-4 GM/100ML-% IV SOLN
2.0000 g | Freq: Three times a day (TID) | INTRAVENOUS | Status: AC
  Administered 2022-08-05: 2 g via INTRAVENOUS
  Filled 2022-08-05: qty 100

## 2022-08-05 MED ORDER — MIDAZOLAM HCL 2 MG/2ML IJ SOLN
INTRAMUSCULAR | Status: DC | PRN
  Administered 2022-08-05: 2 mg via INTRAVENOUS

## 2022-08-05 MED ORDER — BUPIVACAINE HCL 0.25 % IJ SOLN
INTRAMUSCULAR | Status: AC
  Filled 2022-08-05: qty 1

## 2022-08-05 MED ORDER — ALBUMIN HUMAN 5 % IV SOLN: 12.5000 g | Freq: Four times a day (QID) | INTRAVENOUS | Status: DC | PRN

## 2022-08-05 MED ORDER — ALPRAZOLAM 0.25 MG PO TABS
0.2500 mg | ORAL_TABLET | Freq: Two times a day (BID) | ORAL | Status: DC
  Administered 2022-08-05 – 2022-08-06 (×2): 0.25 mg via ORAL
  Filled 2022-08-05 (×2): qty 1

## 2022-08-05 SURGICAL SUPPLY — 53 items
APL PRP STRL LF DISP 70% ISPRP (MISCELLANEOUS) ×1
APPLIER CLIP 5 13 M/L LIGAMAX5 (MISCELLANEOUS)
APR CLP MED LRG 5 ANG JAW (MISCELLANEOUS)
BAG COUNTER SPONGE SURGICOUNT (BAG) ×2 IMPLANT
BAG SPNG CNTER NS LX DISP (BAG) ×1
BINDER ABDOMINAL 12 ML 46-62 (SOFTGOODS) IMPLANT
CABLE HIGH FREQUENCY MONO STRZ (ELECTRODE) ×2 IMPLANT
CHLORAPREP W/TINT 26 (MISCELLANEOUS) ×2 IMPLANT
CLIP APPLIE 5 13 M/L LIGAMAX5 (MISCELLANEOUS) IMPLANT
COVER SURGICAL LIGHT HANDLE (MISCELLANEOUS) ×2 IMPLANT
DEVICE SECURE STRAP 25 ABSORB (INSTRUMENTS) IMPLANT
DEVICE TROCAR PUNCTURE CLOSURE (ENDOMECHANICALS) ×2 IMPLANT
DRAIN CHANNEL 19F RND (DRAIN) IMPLANT
DRAPE INCISE IOBAN 66X45 STRL (DRAPES) IMPLANT
DRAPE WARM FLUID 44X44 (DRAPES) ×2 IMPLANT
DRSG TEGADERM 2-3/8X2-3/4 SM (GAUZE/BANDAGES/DRESSINGS) ×6 IMPLANT
DRSG TEGADERM 4X4.75 (GAUZE/BANDAGES/DRESSINGS) ×2 IMPLANT
ELECT REM PT RETURN 15FT ADLT (MISCELLANEOUS) ×2 IMPLANT
EVACUATOR DRAINAGE 10X20 100CC (DRAIN) IMPLANT
EVACUATOR SILICONE 100CC (DRAIN) ×1
GAUZE SPONGE 2X2 8PLY STRL LF (GAUZE/BANDAGES/DRESSINGS) IMPLANT
GLOVE ECLIPSE 8.0 STRL XLNG CF (GLOVE) ×2 IMPLANT
GLOVE INDICATOR 8.0 STRL GRN (GLOVE) ×2 IMPLANT
GOWN STRL REUS W/ TWL XL LVL3 (GOWN DISPOSABLE) ×6 IMPLANT
GOWN STRL REUS W/TWL XL LVL3 (GOWN DISPOSABLE) ×3
IRRIG SUCT STRYKERFLOW 2 WTIP (MISCELLANEOUS)
IRRIGATION SUCT STRKRFLW 2 WTP (MISCELLANEOUS) IMPLANT
KIT BASIN OR (CUSTOM PROCEDURE TRAY) ×2 IMPLANT
KIT TURNOVER KIT A (KITS) IMPLANT
MARKER SKIN DUAL TIP RULER LAB (MISCELLANEOUS) ×2 IMPLANT
MESH VENTRALIGHT ST 8X10 (Mesh General) IMPLANT
NDL INSUFFLATION 14GA 120MM (NEEDLE) IMPLANT
NDL SPNL 22GX3.5 QUINCKE BK (NEEDLE) IMPLANT
NEEDLE INSUFFLATION 14GA 120MM (NEEDLE) ×1 IMPLANT
NEEDLE SPNL 22GX3.5 QUINCKE BK (NEEDLE) IMPLANT
PAD POSITIONING PINK XL (MISCELLANEOUS) ×2 IMPLANT
PENCIL SMOKE EVACUATOR (MISCELLANEOUS) IMPLANT
SCISSORS LAP 5X35 DISP (ENDOMECHANICALS) ×2 IMPLANT
SET TUBE SMOKE EVAC HIGH FLOW (TUBING) ×2 IMPLANT
SLEEVE ADV FIXATION 5X100MM (TROCAR) ×2 IMPLANT
SPIKE FLUID TRANSFER (MISCELLANEOUS) ×2 IMPLANT
STRIP CLOSURE SKIN 1/2X4 (GAUZE/BANDAGES/DRESSINGS) ×4 IMPLANT
SUT MNCRL AB 4-0 PS2 18 (SUTURE) ×2 IMPLANT
SUT PDS AB 1 CT1 27 (SUTURE) ×24 IMPLANT
SUT PROLENE 1 CT 1 30 (SUTURE) IMPLANT
SUT PROLENE 2 0 SH DA (SUTURE) IMPLANT
SUT VIC AB 3-0 SH 18 (SUTURE) IMPLANT
TOWEL OR 17X26 10 PK STRL BLUE (TOWEL DISPOSABLE) ×2 IMPLANT
TRAY LAPAROSCOPIC (CUSTOM PROCEDURE TRAY) ×2 IMPLANT
TROCAR 11X100 Z THREAD (TROCAR) IMPLANT
TROCAR ADV FIXATION 5X100MM (TROCAR) ×2 IMPLANT
TROCAR XCEL NON-BLD 5MMX100MML (ENDOMECHANICALS) IMPLANT
TROCAR Z-THREAD OPTICAL 5X100M (TROCAR) ×2 IMPLANT

## 2022-08-05 NOTE — Progress Notes (Signed)
  Transition of Care [facility]) Screening Note   Patient Details  Name: Grace Hess Date of Birth: 1955/11/19   Transition of Care [facility]) CM/SW Contact:    Adrian Prows, RN Phone Number: 08/05/2022, 1:34 PM    Transition of Care Department [facility]) has reviewed patient and no TOC needs have been identified at this time. We will continue to monitor patient advancement through interdisciplinary progression rounds. If new patient transition needs arise, please place a TOC consult.

## 2022-08-05 NOTE — Anesthesia Postprocedure Evaluation (Signed)
Anesthesia Post Note  Patient: Grace Hess  Procedure(s) Performed: LAPAROSCOPIC VENTRAL WALL HERNIA REPAIR WITH MESH     Patient location during evaluation: PACU Anesthesia Type: General Level of consciousness: awake Pain management: pain level controlled Vital Signs Assessment: post-procedure vital signs reviewed and stable Respiratory status: spontaneous breathing, nonlabored ventilation and respiratory function stable Cardiovascular status: blood pressure returned to baseline and stable Postop Assessment: no apparent nausea or vomiting Anesthetic complications: no Comments: Patient oxygen saturation improved in the PACU after bronchodilators, IS, and time.    No notable events documented.  Last Vitals:  Vitals:   08/05/22 1202 08/05/22 1305  BP: (!) 113/54 114/61  Pulse:  80  Resp: 14   Temp: 36.5 C 36.7 C  SpO2: 93% (!) 89%    Last Pain:  Vitals:   08/05/22 1305  TempSrc: Oral  PainSc:                  Catheryn Bacon Nettye Flegal

## 2022-08-05 NOTE — Progress Notes (Signed)
PHARMACIST - PHYSICIAN ORDER COMMUNICATION  CONCERNING: P&T Medication Policy on Herbal Medications  DESCRIPTION:  This patient's order for:  Biotin  has been noted.  This product(s) is classified as an "herbal" or natural product. Due to a lack of definitive safety studies or FDA approval, nonstandard manufacturing practices, plus the potential risk of unknown drug-drug interactions while on inpatient medications, the Pharmacy and Therapeutics Committee does not permit the use of "herbal" or natural products of this type within [facility].   ACTION TAKEN: The pharmacy department is unable to verify this order at this time and your patient has been informed of this safety policy. Please reevaluate patient's clinical condition at discharge and address if the herbal or natural product(s) should be resumed at that time.    Dorna Leitz, PharmD, BCPS 08/05/2022 12:41 PM

## 2022-08-05 NOTE — Anesthesia Preprocedure Evaluation (Addendum)
Anesthesia Evaluation  Patient identified by MRN, date of birth, ID band Patient awake    Reviewed: Allergy & Precautions, NPO status , Patient's Chart, lab work & pertinent test results  Airway Mallampati: II  TM Distance: >3 FB Neck ROM: Full    Dental  (+) Missing   Pulmonary shortness of breath and Long-Term Oxygen Therapy, Current Smoker and Patient abstained from smoking. Inhaler O2 prn   Pulmonary exam normal        Cardiovascular hypertension, Pt. on medications and Pt. on home beta blockers + CAD, + Cardiac Stents and + Peripheral Vascular Disease  Normal cardiovascular exam     Neuro/Psych  Headaches PSYCHIATRIC DISORDERS Anxiety Depression    TIA   GI/Hepatic Neg liver ROS, hiatal hernia,GERD  Medicated and Controlled,,  Endo/Other  diabetes    Renal/GU CRFRenal disease     Musculoskeletal negative musculoskeletal ROS (+)    Abdominal   Peds  Hematology  (+) Blood dyscrasia (Plavix), anemia   Anesthesia Other Findings FLANK INCISIONAL ABDOMINAL WALL HERNIA  Reproductive/Obstetrics                              Anesthesia Physical Anesthesia Plan  ASA: 4  Anesthesia Plan: General   Post-op Pain Management:    Induction: Intravenous  PONV Risk Score and Plan: 3 and Ondansetron, Dexamethasone, Treatment may vary due to age or medical condition and Midazolam  Airway Management Planned: Oral ETT  Additional Equipment:   Intra-op Plan:   Post-operative Plan: Extubation in OR  Informed Consent: I have reviewed the patients History and Physical, chart, labs and discussed the procedure including the risks, benefits and alternatives for the proposed anesthesia with the patient or authorized representative who has indicated his/her understanding and acceptance.     Dental advisory given  Plan Discussed with: CRNA  Anesthesia Plan Comments:         Anesthesia  Quick Evaluation

## 2022-08-05 NOTE — H&P (Signed)
08/05/2022    REFERRING PHYSICIAN: Crist Fat, MD  Patient Care Team: Marlowe Kays, MD as PCP - General (Internal Medicine) Crist Fat, MD (Urology) Michaell Cowing, Shawn Route, MD as Consulting Provider (General Surgery) Doreatha Massed, MD (Oncology) Branch, Cindie Crumbly, MD (Cardiovascular Disease)  PROVIDER: Jarrett Soho, MD  [facility] MRN: [id number] DOB: 07-30-1955  SUBJECTIVE   Chief Complaint: Hernia   Grace Hess is a 67 y.o. female  who is seen today as an office consultation  at the request of Dr.. Herrick  for evaluation of hernia and abdomen.  History of Present Illness:  This 67 year old woman. Smoker. Coronary disease status post stenting and restenting 2018. Chronically anticoagulated on Plavix. Underwent robotically assisted nephrectomy for renal cell cancer by Dr. Marlou Porch Dept 2023. Followed by Dr. Ellin Saba from oncology. Had pain and swelling at her extraction incision in the left flank. Concern for incisional hernia. Surgical consultation requested.  Patient comes in with her husband. She continues to smoke. No wound infection. She did notice swelling pretty early after surgery. It is gotten larger and more sensitive. She claims she can walk to the mailbox and back but got winded from the parking lot in. No heart attack or stroke or cardiopulmonary issues with the nephrectomy. She moves her bowels about once a day. No history of urinary tract infections. She had open cholecystectomy, partial hysterectomy, robotic nephrectomy. She is a diabetic but she is not on any insulin. She has been on Ozempic and has had 20 pounds of intentional weight loss.  Medical History:  Past Medical History:  Diagnosis Date  Anxiety  Chronic kidney disease  GERD (gastroesophageal reflux disease)  History of cancer  History of stroke  Hypertension   Patient Active Problem List  Diagnosis  Incisional hernia, without obstruction or gangrene   Tobacco abuse  History of stroke  History of coronary artery stent placement  Diabetes mellitus type 2, noninsulin dependent (CMS-HCC)  Chronic anticoagulation  History of renal cell cancer  Chronic narcotic use   Past Surgical History:  Procedure Laterality Date  Abdominal aortogram 08/02/2016  LAPAROSCOPIC NEPHRECTOMY Left 11/25/2021  CHOLECYSTECTOMY OPEN  Coronary stent intervention  Cystoscopy w/ retrogrades Bilateral  LAPAROSCOPIC URETERONEOCYSTOSTOMY WITHOUT CYSTOSCOPY AND URETERAL STENT PLACEMENT  Left heart cath and coronary angiography  VAGINAL HYSTERECTOMY    Allergies  Allergen Reactions  Codeine Shortness Of Breath  Latex Itching  Sulfa (Sulfonamide Antibiotics) Other (See Comments)  Reaction: Unknown   Current Outpatient Medications on File Prior to Visit  Medication Sig Dispense Refill  albuterol (VENTOLIN) 2 mg/5 mL syrup TAKE 10 ML BY MOUTH THREE TIMES DAILY  albuterol 90 mcg/actuation inhaler INHALE ONE TO 2 PUFFS BY INHALATION EVERY FOUR HOURS AS NEEDED  ALPRAZolam (XANAX) 0.5 MG tablet Take 0.5 mg by mouth 2 (two) times daily as needed  amLODIPine (NORVASC) 5 MG tablet Take 5 mg by mouth once daily  aspirin 81 MG EC tablet Take by mouth  benzonatate (TESSALON) 100 MG capsule Take by mouth  biotin 5 mg capsule Take by mouth  cephalexin (KEFLEX) 500 MG capsule Take 500 mg by mouth 2 (two) times daily  cholecalciferol (VITAMIN D3) 5,000 unit capsule Take by mouth  clopidogreL (PLAVIX) 75 mg tablet  escitalopram oxalate (LEXAPRO) 20 MG tablet Take 1 tablet by mouth once daily  gabapentin (NEURONTIN) 300 MG capsule Take 1 capsule by mouth 3 (three) times daily  metoprolol tartrate (LOPRESSOR) 25 MG tablet  ondansetron (ZOFRAN) 4 MG tablet  oxyCODONE-acetaminophen (PERCOCET) 5-325  mg tablet Take 1 tablet by mouth 3 (three) times daily as needed  pantoprazole (PROTONIX) 40 MG DR tablet  promethazine (PHENERGAN) 25 MG tablet Take by mouth  rosuvastatin  (CRESTOR) 20 MG tablet   No current facility-administered medications on file prior to visit.   Family History  Problem Relation Age of Onset  Skin cancer Mother  High blood pressure (Hypertension) Mother  Coronary Artery Disease (Blocked arteries around heart) Mother  Diabetes Sister    Social History   Tobacco Use  Smoking Status Every Day  Types: Cigarettes  Smokeless Tobacco Not on file    Social History   Socioeconomic History  Marital status: Married  Tobacco Use  Smoking status: Every Day  Types: Cigarettes  Substance and Sexual Activity  Alcohol use: Not Currently  Drug use: Not Currently   ############################################################  Review of Systems: A complete review of systems (ROS) was obtained from the patient.  We have reviewed this information and discussed as appropriate with the patient.  See HPI as well for other pertinent ROS.  Constitutional: No fevers, chills, sweats. Weight stable Eyes: No vision changes, No discharge HENT: No sore throats, nasal drainage Lymph: No neck swelling, No bruising easily Pulmonary: No cough, productive sputum CV: No orthopnea, PND . No exertional chest/neck/shoulder/arm pain. Patient can walk 1 block gradually.   GI: No personal nor family history of GI/colon cancer, inflammatory bowel disease, irritable bowel syndrome, allergy such as Celiac Sprue, dietary/dairy problems, colitis, ulcers nor gastritis. No recent sick contacts/gastroenteritis. No travel outside the country. No changes in diet.  Renal: No UTIs, No hematuria Genital: No drainage, bleeding, masses Musculoskeletal: No severe joint pain. Good ROM major joints Skin: No sores or lesions Heme/Lymph: No easy bleeding. No swollen lymph nodes Neuro: No active seizures. No facial droop Psych: No hallucinations. No agitation  OBJECTIVE   Vitals:  04/12/22 0849  BP: 132/74  Pulse: 50  Weight: 63.5 kg (140 lb)  Height: 167.6 cm (5\' 6" )    Body mass index is 22.6 kg/m.  PHYSICAL EXAM:  Constitutional: Not cachectic. Hygeine adequate. Vitals signs as above.  Eyes: Wears glasses - vision corrected,Pupils reactive, normal extraocular movements. Sclera nonicteric Neuro: CN II-XII intact. No major focal sensory defects. No major motor deficits. Lymph: No head/neck/groin lymphadenopathy Psych: No severe agitation. No severe anxiety. Judgment & insight Adequate, Oriented x4, HENT: Normocephalic, Mucus membranes moist. No thrush. Hearing: adequate Neck: Supple, No tracheal deviation. No obvious thyromegaly Chest: No pain to chest wall compression. Good respiratory excursion. No audible wheezing CV: Pulses intact. regular. No major extremity edema Ext: No obvious deformity or contracture. Edema: Not present. No cyanosis Skin: No major subcutaneous nodules. Warm and dry Musculoskeletal: Severe joint rigidity not present. No obvious clubbing. No digital petechiae. Mobility: no assist device moving easily without restrictions  Abdomen: Flat Soft. Nondistended. Nontender. Hernia: Left anterior flank is a 8 x 8 cm mass that reduces down to a smaller fascial defect consistent with incisional hernia.. Diastasis recti: Not present. No hepatomegaly. No splenomegaly.  Genital/Pelvic: Inguinal hernia: Not present. Inguinal lymph nodes: without lymphadenopathy nor hidradenitis.   Rectal: (Deferred)    ###################################################################  Labs, Imaging and Diagnostic Testing:  Located in 'Care Everywhere' section of Epic EMR chart  PRIOR [facility] NOTES:  Not applicable  SURGERY NOTES:  Located in 'Care Everywhere' section of Epic EMR chart  PATHOLOGY:  Located in 'Care Everywhere' section of Epic EMR chart  Assessment and Plan:  DIAGNOSES:  Diagnoses and all orders  for this visit:  Incisional hernia, without obstruction or gangrene  Tobacco abuse  History of stroke  History of  coronary artery stent placement  Diabetes mellitus type 2, noninsulin dependent (CMS-HCC)  Chronic anticoagulation  History of renal cell cancer  Chronic narcotic use    ASSESSMENT/PLAN  Incisional hernia at left anterior flank from prior left nephrectomy for renal cell cancer. Past med history complicated with history of stroke and coronary disease with stenting on chronic Plavix anticoagulation followed by cardiology. Diabetes non-insulin-requiring. Now with 1 kidney. Chronic pain on chronic narcotics.  Standard care is to consider incisional hernia repair given increasing size and her discomfort.  She has been seen by cardiology.  She is followed by nephrology for her chronic kidney disease after nephrectomy.  Her glucoses have been a challenging to treatment she has been followed by endocrinology and they are working to more aggressively fix that.   The anatomy & physiology of the abdominal wall was discussed. The pathophysiology of hernias was discussed. Natural history risks without surgery including progeressive enlargement, pain, incarceration, & strangulation was discussed. Contributors to complications such as smoking, obesity, diabetes, prior surgery, etc were discussed.   I feel the risks of no intervention will lead to serious problems that outweigh the operative risks; therefore, I recommended surgery to reduce and repair the hernia. I explained laparoscopic techniques with possible need for an open approach. I noted the probable use of mesh to patch and/or buttress the hernia repair  Risks such as bleeding, infection, abscess, need for further treatment, injury to other organs, need for repair of tissues / organs, stroke, heart attack, death, and other risks were discussed. I noted a good likelihood this will help address the problem. Goals of post-operative recovery were discussed as well. Possibility that this will not correct all symptoms was explained. I stressed the  importance of low-impact activity, aggressive pain control, avoiding constipation, & not pushing through pain to minimize risk of post-operative chronic pain or injury. Possibility of reherniation especially with smoking, obesity, diabetes, immunosuppression, and other health conditions was discussed. We will work to minimize complications.   An educational handout further explaining the pathology & treatment options was given as well. Questions were answered. The patient expresses understanding & wishes to proceed with surgery.       08/05/2022

## 2022-08-05 NOTE — Anesthesia Procedure Notes (Signed)
Procedure Name: Intubation Date/Time: 08/05/2022 7:31 AM  Performed by: Nelle Don, CRNAPre-anesthesia Checklist: Patient identified, Emergency Drugs available, Suction available and Patient being monitored Patient Re-evaluated:Patient Re-evaluated prior to induction Oxygen Delivery Method: Circle system utilized Preoxygenation: Pre-oxygenation with 100% oxygen Induction Type: IV induction Ventilation: Mask ventilation without difficulty Laryngoscope Size: Mac and 4 Grade View: Grade I Tube type: Oral Tube size: 7.0 mm Number of attempts: 1 Airway Equipment and Method: Stylet Placement Confirmation: ETT inserted through vocal cords under direct vision, positive ETCO2 and breath sounds checked- equal and bilateral Secured at: 22 cm Tube secured with: Tape Dental Injury: Teeth and Oropharynx as per pre-operative assessment

## 2022-08-05 NOTE — Op Note (Addendum)
08/05/2022  PATIENT:  Grace Hess  67 y.o. female  Patient Care Team: Elfredia Nevins, MD as PCP - General (Internal Medicine) Wyline Mood, Dorothe Pea, MD as PCP - Cardiology (Cardiology) Doreatha Massed, MD as Medical Oncologist (Medical Oncology) Crist Fat, MD as Attending Physician (Urology) Dani Gobble, NP as Nurse Practitioner (Endocrinology) Karie Soda, MD as Consulting Physician (General Surgery)  PRE-OPERATIVE DIAGNOSIS:  FLANK INCISIONAL ABDOMINAL WALL HERNIAE Dimensions of hernia by pre-op evaluation:  7cm x 6cm  POST-OPERATIVE DIAGNOSIS:  FLANK INCISIONAL ABDOMINAL WALL HERNIAE  Dimensions of hernia post-op:  7cm x 6cm  PROCEDURE:   LAPAROSCOPIC REPAIR OF ABDOMINAL HERNIA WITH MESH  (See OR Findings below)  TAP BLOCK - BILATERAL  SURGEON:  Ardeth Sportsman, MD  ASSISTANT:  (n/a)   ANESTHESIA:  General endotracheal intubation anesthesia (GETA) and Regional TRANSVERSUS ABDOMINIS PLANE (TAP) nerve block -BILATERAL for perioperative & postoperative pain control at the level of the transverse abdominis & preperitoneal spaces along the flank at the anterior axillary line, from subcostal ridge to iliac crest under laparoscopic guidance provided with liposomal bupivacaine (Experel) 20mL mixed with 40 mL of bupivicaine 0.25%  Estimated Blood Loss (EBL):   Total I/O In: 750 [I.V.:650; IV Piggyback:100] Out: 10 [Blood:10].   (See anesthesia record)  Delay start of Pharmacological VTE agent (>24hrs) due to concerns of significant anemia, surgical blood loss, or risk of bleeding?:  no  DRAINS: 19 Fr Blake drain with the tip resting in the subcutaneous tissues   SPECIMEN:  (no specimen)  DISPOSITION OF SPECIMEN:  Pathology and (not applicable)  COUNTS:  Sponge, needle, & instrument counts CORRECT  PLAN OF CARE: Admit for overnight observation  PATIENT DISPOSITION:  PACU - hemodynamically stable.  INDICATION: Pleasant patient will get on his history  of smoking obesity and fairly controlled diabetes required robotic nephrectomy for renal cancer.  Has developed a ventral wall abdominal incision hernia. Recommendation was made for surgical repair  The anatomy & physiology of the abdominal wall was discussed. The pathophysiology of hernias was discussed. Natural history risks without surgery including progeressive enlargement, pain, incarceration & strangulation was discussed. Contributors to complications such as smoking, obesity, diabetes, prior surgery, etc were discussed.  I feel the risks of no intervention will lead to serious problems that outweigh the operative risks; therefore, I recommended surgery to reduce and repair the hernia. I explained laparoscopic techniques with possible need for an open approach. I noted the probable use of mesh to patch and/or buttress the hernia repair.  Risks such as bleeding, infection, abscess, need for further treatment, heart attack, death, and other risks were discussed. I noted a good likelihood this will help address the problem. Goals of post-operative recovery were discussed as well. Possibility that this will not correct all symptoms was explained. I stressed the importance of low-impact activity, aggressive pain control, avoiding constipation, & not pushing through pain to minimize risk of post-operative chronic pain or injury. Possibility of reherniation especially with smoking, obesity, diabetes, immunosuppression, and other health conditions was discussed. We will work to minimize complications.   An educational handout further explaining the pathology & treatment options was given as well. Questions were answered. The patient expresses understanding & wishes to proceed with surgery.   OR FINDINGS: Left paramedian incisional hernia containing primarily omentum  Hernia location: Left Flank  Hernia type: Incisional - Incarcerated  Hernia size - largest dimension 7cm x 6cm  Type of repair:  Laparoscopic underlay repair.  Primary repair of largest  hernia   Placement of mesh: Centrally intraperitoneal with edges tucked into RECTRORECTUS & preperitoneal space  Name of mesh: Bard Ventralight dual sided (polypropylene / Seprafilm)  Size of mesh: 25x20cm  Orientation: Transverse  Mesh overlap:  5-7cm   DESCRIPTION:   Informed consent was confirmed. The patient underwent general anaesthesia without difficulty. The patient was positioned appropriately. VTE prevention in place. The patient's abdomen was clipped, prepped, & draped in a sterile fashion. Surgical timeout confirmed our plan.  The patient was positioned in reverse Trendelenburg. Abdominal entry was gained using optical entry technique in the left upper abdomen. Entry was clean. I induced carbon dioxide insufflation. Camera inspection revealed no injury. Extra ports were carefully placed under direct laparoscopic visualization.   I could see adhesions on the parietal peritoneum under the abdominal wall.  I did laparoscopic lysis of adhesions to expose the entire anterior abdominal wall.  I primarily used focused sharp dissection.  Freed omentum off and found the hernia containing omentum.  Gradually reduces down and freed the edges.  I made a vertical incision through the peritoneum along the midclavicular line just lateral to the lateral side of the hernia.  Freed peritoneum and posterior fascia off the abdominal muscular compartment towards the posterior axillary line to good result.  I made sure hemostasis was good.  I mapped out the region using a needle passer.   To ensure that I would have at least 5 cm radial coverage outside of the hernia defect, I chose a 25x20cm dual sided mesh.  I placed #1 PDS stitches around its edge about every 5 cm =  12 total.  I rolled the mesh & placed into the peritoneal cavity through the hernia defect.  I unrolled the mesh and positioned it appropriately.  I secured the mesh to cover up the  hernia defect using a laparoscopic suture passer to pass the tails of the Prolene through the abdominal wall & tagged them with clamps for good transfascial suturing.  I started out in four corners to make sure I had the mesh centered under the hernia defect appropriately, and then proceeded to work in quadrants.    We evacuated CO2 & desufflated the abdomen.  I tied the fascial stitches down. I closed the fascial defect that I placed the mesh through using #1 PDS interrupted transverse stitches primarily.  I reinsufflated the abdomen. The mesh provided at least circumferential coverage around the entire region of hernia defects.  I secured the mesh centrally with an additional trans fascial stitch in & out the mesh using #1 PDS under laparoscopic visualization.   I tacked the edges & central part of the mesh to the peritoneum/posterior rectus fascia with SecureStrap absorbable tacks.   I did reinspection. Hemostasis was good. Mesh laid well. I completed a broad field block of local anesthesia at fascial stitch sites & fascial closure areas.  Patient had a large subcutaneous hernia sac and 15 x 15 x 12 cm ,so I decided to place a drain.  I was able to tunnel a 19 [place] Blake drain from the right upper quadrant 5 mm port site subcutaneous tissues to the hernia sac peripherally.  I did it with the abdomen insufflated laparoscopically to assure that was sliding along the abdominal wall subcutaneous tissues and not into the peritoneal cavity.  Secured the SQ drain using a 2-0 Prolene suture  Capnoperitoneum was evacuated. Ports were removed.  I tacked down the hernia sac using 2-0 Vicryl interrupted sutures to also provide  some deeper closure of the wound transversely.  Closed the skin with running 4 Monocryl suture to good result.  Port site skin wounds closed with Monocryl at the port sites and Steri-Strips on the fascial stitch puncture sites.  Patient is being extubated to go to the recovery room.  I ran into  Dr. Marlou Porch and updated him as well.  I discussed operative findings, updated the patient's status, discussed probable steps to recovery, and gave postoperative recommendations to the patient's spouse, Grace Hess.  Recommendations were made.  Questions were answered.  He expressed understanding & appreciation.  Ardeth Sportsman, M.D., F.A.C.S. Gastrointestinal and Minimally Invasive Surgery [facility] [address] [place], [place] [address] [phone] Main / Paging  08/05/2022 9:39 AM

## 2022-08-05 NOTE — Discharge Instructions (Addendum)
HERNIA REPAIR: POST OP INSTRUCTIONS  ######################################################################  EAT Gradually transition to a high fiber diet with a fiber supplement over the next few weeks after discharge.  Start with a pureed / full liquid diet (see below)  WALK Walk an hour a day.  Control your pain to do that.    CONTROL PAIN Control pain so that you can walk, sleep, tolerate sneezing/coughing, and go up/down stairs.  HAVE A BOWEL MOVEMENT DAILY Keep your bowels regular to avoid problems.  OK to try a laxative to override constipation.  OK to use an antidairrheal to slow down diarrhea.  Call if not better after 2 tries  CALL IF YOU HAVE PROBLEMS/CONCERNS Call if you are still struggling despite following these instructions. Call if you have concerns not answered by these instructions  ######################################################################    DIET: Follow a light bland diet & liquids the first 24 hours after arrival home, such as soup, liquids, starches, etc.  Be sure to drink plenty of fluids.  Quickly advance to a usual solid diet within a few days.  Avoid fast food or heavy meals as your are more likely to get nauseated or have irregular bowels.  A low-fat, high-fiber diet for the rest of your life is ideal.   Take your usually prescribed home medications unless otherwise directed.  PAIN CONTROL: Pain is best controlled by a usual combination of three different methods TOGETHER: Ice/Heat Over the counter pain medication Prescription pain medication Most patients will experience some swelling and bruising around the hernia(s) such as the bellybutton, groins, or old incisions.  Ice packs or heating pads (30-60 minutes up to 6 times a day) will help. Use ice for the first few days to help decrease swelling and bruising, then switch to heat to help relax tight/sore spots and speed recovery.  Some people prefer to use ice alone, heat alone, alternating  between ice & heat.  Experiment to what works for you.  Swelling and bruising can take several weeks to resolve.   It is helpful to take an over-the-counter pain medication regularly for the first few weeks.  Choose one of the following that works best for you: Naproxen (Aleve, etc)  Two 220mg  tabs twice a day Ibuprofen (Advil, etc) Three 200mg  tabs four times a day (every meal & bedtime) Acetaminophen (Tylenol, etc) 325-650mg  four times a day (every meal & bedtime) A  prescription for pain medication should be given to you upon discharge.  Take your pain medication as prescribed.  If you are having problems/concerns with the prescription medicine (does not control pain, nausea, vomiting, rash, itching, etc), please call us [phone] to see if we need to switch you to a different pain medicine that will work better for you and/or control your side effect better. If you need a refill on your pain medication, please contact your pharmacy.  They will contact our office to request authorization. Prescriptions will not be filled after 5 pm or on week-ends.  AVOID GETTING CONSTIPATED.   Between the surgery and the pain medications, it is common to experience some constipation.  Drink plenty of liquids Take a fiber supplement 2 times day (such as Metamucil, Citrucel, FiberCon, MiraLax, etc) to have a bowel movement every day. If you have not had a BM by 2 days after surgery: -drink liquids only until you have a bowel movement -take MiraLAX 2 doses every 2 hours until you have a bowel movement   Wash / shower every day.  You may  shower over the dressings as they are waterproof.    It is good for closed incisions and even open wounds to be washed every day.  Shower every day.  Short baths are fine.  Wash the incisions and wounds clean with soap & water.    You may leave closed incisions open to air if it is dry.   You may cover the incision with clean gauze & replace it after your daily shower for  comfort.  TEGADERM & STERISTRIPS:  You have clear gauze band-aid dressings over your closed incision(s).  You also have skin tapes called Steristrips on your incisions.  Leave these in place.  You may trim any edges that curl up with clean scissors.  You may remove all dressings & tapes in the shower in 3 days.    ACTIVITIES as tolerated:   You may resume regular (light) daily activities beginning the next day--such as daily self-care, walking, climbing stairs--gradually increasing activities as tolerated.  Control your pain so that you can walk an hour a day.  If you can walk 30 minutes without difficulty, it is safe to try more intense activity such as jogging, treadmill, bicycling, low-impact aerobics, swimming, etc. Save the most intensive and strenuous activity for last such as sit-ups, heavy lifting, contact sports, etc  Refrain from any heavy lifting or straining until you are off narcotics for pain control.   DO NOT PUSH THROUGH PAIN.  Let pain be your guide: If it hurts to do something, don't do it.  Pain is your body warning you to avoid that activity for another week until the pain goes down. You may drive when you are no longer taking prescription pain medication, you can comfortably wear a seatbelt, and you can safely maneuver your car and apply brakes. You may have sexual intercourse when it is comfortable.   FOLLOW UP in our office Please call [facility] at [phone] to set up an appointment to see your surgeon in the office for a follow-up appointment approximately 2-3 weeks after your surgery. Make sure that you call for this appointment the day you arrive home to insure a convenient appointment time.  9.  If you have disability of FMLA / Family leave forms, please bring the forms to the office for processing.  (do not give to your surgeon).  WHEN TO CALL us [phone]: Poor pain control Reactions / problems with new medications (rash/itching, nausea, etc)  Fever over 101.5 F  (38.5 C) Inability to urinate Nausea and/or vomiting Worsening swelling or bruising Continued bleeding from incision. Increased pain, redness, or drainage from the incision   The clinic staff is available to answer your questions during regular business hours (8:30am-5pm).  Please don't hesitate to call and ask to speak to one of our nurses for clinical concerns.   If you have a medical emergency, go to the nearest emergency room or call 911.  A surgeon from [facility] is always on call at the hospitals in [facility], [place] [address], [place], [place]  [address] ?  [address] [address], [place], [place]   [address] MAIN: [phone] ? TOLL FREE: [phone] ? FAX: [phone] [email or web address]   DRAIN CARE:   You have a closed bulb drain to help you heal.    Bulb Closed Drain Care  The bulb drainage system has flexible tubing attached to a soft, plastic egg shaped clear plastic bulb with a stopper. The drainage end  of the tubing goes into a cavity your body (surgery area, abscess, etc) through a small opening in your skin. A stitch & tape holds the drainage end in place. The rest of the tube is outside your body, attached to a bulb. When the bulb is squeezed down & compressed with the stopper in place, it creates a constant gentle suction.  This helps draw out fluid that collects in the bulb to help the inner cavity to shrink down & heal. The bulb should be compressed at all times, except when you are emptying the drainage.  How long you will have your drain depends on your recovery & the amount & type of fluid is draining. This is different for everyone. The drain & tubing is usually removed when the drainage is 30 mL or less a day. to your follow-up appointments.  Caring for Your DRAIN In order to care for your Jackson-Pratt at home, you or your caregiver will do the following:  Empty the drain at least once a day  and record the color and amount of drainage  Care for the area where the tubing enters your skin by washing with soap and water. Place the bulb in a pocket or safety-pin to your clothes to keep it from pulling at the skin  Milk or flush the tubing to keep the tubing from getting plugged up / blocked.   Do this before you empty and measure your drainage. Look in the mirror at the tubing. This will help you see where your hands need to be. Pinch the tubing close to where it goes into your skin between your thumb and forefinger. With the thumb and forefinger of your other hand, pinch the tubing right below your other fingers. Keep your fingers pinched and slide them down the tubing, pushing any clots down toward the bulb. You may want to use alcohol swabs to help you slide your fingers down the tubing. Repeat steps 3 and 4 as necessary to push clots from the tubing into the bulb. If you are not able to move a clot into the bulb, call your doctor's office. The fluid may leak around the insertion site if a clot is blocking the drainage flow. If there is fluid in the bulb and no leakage at the insertion site, the drain is working.   Caring for the Insertion Site  Once you have emptied the drainage, clean your hands again.  Check the area around the insertion site:  Redness, swelling, crusting, or yellow scab can form around a drain over time. Usually this is smaller than a dime & stays small  If you have worsening tenderness, swelling, or pus or a fever >101.32F; you may have an infection. Call your doctor's office.  Wash drain site with soap & water (dilute hydrogen peroxide PRN) daily & replace clean dressing / tape    DAILY DRAIN CARE Keep the bulb compressed at all times, except while emptying it. The compression creates suction to gently drain & closed the cavity .  Keep sites where the tubes enter the skin dry and covered with a light bandage (dressing).  Tape the tubes to your skin, 1 to 2  inches away from the insertion site, to keep from pulling on your stitches. Tubes are stitched in place and will not slip out.  Pin the bulb to your shirt (not to your pants) with a safety pin.  For the first few days after surgery, there usually is more fluid in the bulb.  Empty the bulb whenever it becomes half full because the bulb does not create enough suction if it is too full. Include this amount in your 24 hour totals.  When the amount of drainage decreases, empty the bulb at the same time every day. Write down the amounts and the 24 hour totals. Your caregiver will want to know them. This helps your caregiver know when the tubes can be removed.  (We anticipate removing the drain in 1-3 weeks, depending on when the output is <59mL a day for 2+ days) If there is drainage around the tube sites, change dressings and keep the area dry. If you see a clot in the tube, leave it alone. However, if the tube does not appear to be draining, let your caregiver know.   TO EMPTY THE BULB Wash your hands before & after Open the stopper to release suction.  Holding the stopper out of the way, pour drainage into the measuring cup that was sent home with you.  Measure and write down the amount. If there are 2 bulbs, note the amount of drainage from bulb 1 or bulb 2 and keep the totals separate.  Compress the bulb by folding it in half. & replace the stopper.  Check the tape that holds the tube to your skin, and pin the bulb to your shirt.    SEEK MEDICAL CARE IF: The drainage develops a bad odor.  You have an oral temperature above 101.5 F (38.5 C).  The amount of drainage from your wound suddenly increases or decreases.  You accidentally pull out your drain.  You have any other questions or concerns.      Call our office if you have any questions about your drain. [phone]

## 2022-08-05 NOTE — Transfer of Care (Signed)
Immediate Anesthesia Transfer of Care Note  Patient: Grace Hess  Procedure(s) Performed: LAPAROSCOPIC VENTRAL WALL HERNIA REPAIR WITH MESH  Patient Location: PACU  Anesthesia Type:General  Level of Consciousness: drowsy  Airway & Oxygen Therapy: Patient Spontanous Breathing and Patient connected to face mask oxygen  Post-op Assessment: Report given to RN, Post -op Vital signs reviewed and stable, and Patient moving all extremities X 4  Post vital signs: Reviewed and stable  Last Vitals:  Vitals Value Taken Time  BP 146/58   Temp    Pulse 84   Resp 12   SpO2 92     Last Pain:  Vitals:   08/05/22 0616  TempSrc: Oral         Complications: No notable events documented.

## 2022-08-05 NOTE — Interval H&P Note (Signed)
History and Physical Interval Note:  08/05/2022 6:45 AM  Grace Hess  has presented today for surgery, with the diagnosis of FLANK INCISIONAL ABDOMINAL WALL HERNIAE.  The various methods of treatment have been discussed with the patient and family. After consideration of risks, benefits and other options for treatment, the patient has consented to  Procedure(s): LAPAROSCOPIC VENTRAL WALL HERNIA REPAIR (N/A) as a surgical intervention.  The patient's history has been reviewed, patient examined, no change in status, stable for surgery.  I have reviewed the patient's chart and labs.  Questions were answered to the patient's satisfaction.    I have re-reviewed the the patient's records, history, medications, and allergies.  I have re-examined the patient.  I again discussed intraoperative plans and goals of post-operative recovery.  The patient agrees to proceed.  Grace Hess  Jul 15, 1955 [id number]  Patient Care Team: Elfredia Nevins, MD as PCP - General (Internal Medicine) Wyline Mood Dorothe Pea, MD as PCP - Cardiology (Cardiology) Doreatha Massed, MD as Medical Oncologist (Medical Oncology) Crist Fat, MD as Attending Physician (Urology) Dani Gobble, NP as Nurse Practitioner (Endocrinology)  Patient Active Problem List   Diagnosis Date Noted   Renal cell adenocarcinoma (HCC) 11/25/2021   Renal cell cancer, left (HCC) 10/01/2021   Left renal mass    Acute renal failure superimposed on stage 3b chronic kidney disease (HCC) 07/14/2021   Perianal abscess 03/30/2021   Vitamin D deficiency 08/23/2019   Loss of weight 07/17/2019   PAD (peripheral artery disease) (HCC) 07/04/2019   IBS (irritable bowel syndrome) 01/16/2019   Uncontrolled type 2 diabetes mellitus with hyperglycemia, without long-term current use of insulin (HCC) 01/08/2019   Type 2 diabetes mellitus without complication (HCC) 12/21/2018   Lumbar back pain 12/21/2018   Mixed hyperlipidemia 12/21/2018    CAD S/P percutaneous coronary angioplasty 08/03/2016   Claudication (HCC) 08/02/2016   Abnormal stress test 08/02/2016   Stable angina 08/02/2016   Heavy smoker (more than 20 cigarettes per day)    Headache 12/10/2014   Carotid artery disease (HCC) 12/01/2014   TIA (transient ischemic attack) 11/28/2014   Tobacco use disorder 11/28/2014   HYPERCHOLESTEROLEMIA  IIA 09/13/2008   Depression with anxiety 09/13/2008   Essential hypertension, benign 09/13/2008   GERD (gastroesophageal reflux disease) 09/13/2008   Chest pain with high risk for cardiac etiology 09/13/2008    Past Medical History:  Diagnosis Date   Anxiety    CAD (coronary artery disease)    a. s/p DES to RCA in 07/2016 with residual mid-LAD stenosis   Cancer (HCC)    skin cancer on finger,, cancer left kidney   Cervicalgia    Chest pain, unspecified    Chronic kidney disease    Depression    GERD (gastroesophageal reflux disease)    Headache    "bad ones after my stroke in 2016; only have them when I get bad news now" (08/02/2016)   Heart murmur    Heavy smoker (more than 20 cigarettes per day)    Scheduled for LDCT screening 02/11/15   History of hiatal hernia    History of kidney stones    "no cysto/OR" (12/10/2014)   Hypercholesteremia    Hypertension    Obesity    Palpitations    Reflux esophagitis    Stroke [facility])    "they said I had a  MINI stroke a few months back/MRI" (12/10/2014)   TIA (transient ischemic attack) 11/28/2014   [name] 11/28/2014   Type II diabetes mellitus (  HCC)     Past Surgical History:  Procedure Laterality Date   ABDOMINAL AORTOGRAM N/A 08/02/2016   Procedure: Abdominal Aortogram;  Surgeon: Yvonne Kendall, MD;  Location: [facility];  Service: Cardiovascular;  Laterality: N/A;   CAROTID STENT     CHOLECYSTECTOMY OPEN  1978   COLONOSCOPY  2010   single diverticulum in sigmoid colon   CORONARY ANGIOPLASTY WITH STENT PLACEMENT  08/02/2016   to RCA   CORONARY STENT  INTERVENTION N/A 08/02/2016   Procedure: Coronary Stent Intervention;  Surgeon: Yvonne Kendall, MD;  Location: [facility];  Service: Cardiovascular;  Laterality: N/A;  RCA   CYSTOSCOPY W/ RETROGRADES Bilateral 07/15/2021   Procedure: CYSTOSCOPY WITH RETROGRADE PYELOGRAM;  Surgeon: Milderd Meager., MD;  Location: [facility];  Service: Urology;  Laterality: Bilateral;   CYSTOSCOPY WITH STENT PLACEMENT Left 07/15/2021   Procedure: CYSTOSCOPY WITH STENT PLACEMENT;  Surgeon: Milderd Meager., MD;  Location: [facility];  Service: Urology;  Laterality: Left;   ENDARTERECTOMY Left 12/02/2014   Procedure: ENDARTERECTOMY CAROTID;  Surgeon: Larina Earthly, MD;  Location: [facility];  Service: Vascular;  Laterality: Left;   LAPAROSCOPIC NEPHRECTOMY Left    LAPAROSCOPIC NEPHRECTOMY Left 11/25/2021   Procedure: LEFT LAPAROSCOPIC RADICAL NEPHRECTOMY;  Surgeon: Crist Fat, MD;  Location: [facility];  Service: Urology;  Laterality: Left;   LEFT HEART CATH AND CORONARY ANGIOGRAPHY N/A 08/02/2016   Procedure: Left Heart Cath and Coronary Angiography;  Surgeon: Yvonne Kendall, MD;  Location: [facility];  Service: Cardiovascular;  Laterality: N/A;   LOWER EXTREMITY ANGIOGRAM  08/02/2016   LOWER EXTREMITY ANGIOGRAPHY N/A 08/02/2016   Procedure: Lower Extremity Angiography;  Surgeon: Yvonne Kendall, MD;  Location: [facility];  Service: Cardiovascular;  Laterality: N/A;   NEPHRECTOMY Left    VAGINAL HYSTERECTOMY  1991   "partial"    Social History   Socioeconomic History   Marital status: Married    Spouse name: Not on file   Number of children: 2   Years of education: 12   Highest education level: Not on file  Occupational History   Not on file  Tobacco Use   Smoking status: Every Day    Packs/day: 1.50    Years: 43.00    Additional pack years: 0.00    Total pack years: 64.50    Types: Cigarettes    Start date: 05/17/1974   Smokeless tobacco: Never  Vaping Use   Vaping  Use: Never used  Substance and Sexual Activity   Alcohol use: No    Alcohol/week: 0.0 standard drinks of alcohol   Drug use: No   Sexual activity: Not Currently  Other Topics Concern   Not on file  Social History Narrative   Patient does not drink caffeine.   Patient is right handed.    Social Determinants of Health   Financial Resource Strain: Not on file  Food Insecurity: No Food Insecurity (11/25/2021)   Hunger Vital Sign    Worried About Running Out of Food in the Last Year: Never true    Ran Out of Food in the Last Year: Never true  Transportation Needs: No Transportation Needs (11/25/2021)   PRAPARE - Administrator, Civil Service (Medical): No    Lack of Transportation (Non-Medical): No  Physical Activity: Not on file  Stress: Not on file  Social Connections: Not on file  Intimate Partner Violence: Not At Risk (11/25/2021)   Humiliation, Afraid, Rape, and Kick questionnaire  Fear of Current or Ex-Partner: No    Emotionally Abused: No    Physically Abused: No    Sexually Abused: No    Family History  Problem Relation Age of Onset   Congestive Heart Failure Mother    COPD Father    Cancer Brother    Cancer Brother    Breast cancer Neg Hx     Medications Prior to Admission  Medication Sig Dispense Refill Last Dose   acetaminophen (TYLENOL) 325 MG tablet Take 2 tablets (650 mg total) by mouth every 4 (four) hours as needed for headache or mild pain.   Past Week   albuterol (VENTOLIN HFA) 108 (90 Base) MCG/ACT inhaler Inhale 1-2 puffs into the lungs every 4 (four) hours as needed for shortness of breath or wheezing.   Past Week   ALPRAZolam (XANAX) 0.5 MG tablet Take 0.25 mg by mouth 2 (two) times daily.   08/05/2022 at 0400   amLODipine (NORVASC) 5 MG tablet Take 1 tablet (5 mg total) by mouth daily. 30 tablet 1 08/05/2022 at 0400   Biotin 5000 MCG CAPS Take 5,000 mcg by mouth daily.   Past Week   calcitRIOL (ROCALTROL) 0.25 MCG capsule Take 0.25 mcg by  mouth daily.   Past Week   calcium acetate (PHOSLO) 667 MG tablet Take 667 mg by mouth daily.   Past Week   Cholecalciferol (VITAMIN D3) 125 MCG (5000 UT) CAPS Take 5,000 Units by mouth once a week. Tuesday   Past Week   clopidogrel (PLAVIX) 75 MG tablet Take 1 tablet (75 mg total) by mouth daily. 30 tablet 3 07/30/2022   escitalopram (LEXAPRO) 20 MG tablet Take 20 mg by mouth daily.   08/05/2022 at 0400   gabapentin (NEURONTIN) 300 MG capsule Take 300 mg by mouth at bedtime.   Past Week   insulin degludec (TRESIBA FLEXTOUCH) 100 UNIT/ML FlexTouch Pen Inject 15 Units into the skin at bedtime. (Patient taking differently: Inject 15 Units into the skin 2 (two) times daily.) 12 mL 3 Past Week   LOKELMA 10 g PACK packet Take 10 g by mouth 3 (three) times a week.   Past Week   nystatin cream (MYCOSTATIN) Apply 1 application  topically 2 (two) times daily as needed (Itching).  5 Past Week   ondansetron (ZOFRAN) 4 MG tablet TAKE 1 TABLET(4 MG) BY MOUTH TWICE DAILY AS NEEDED FOR NAUSEA OR VOMITING (Patient taking differently: Take 4 mg by mouth every 8 (eight) hours as needed for nausea.) 20 tablet 0 Past Week   oxyCODONE-acetaminophen (PERCOCET/ROXICET) 5-325 MG tablet Take 1 tablet by mouth 3 (three) times daily as needed.   Past Week   promethazine (PHENERGAN) 25 MG tablet Take 25 mg by mouth 4 (four) times daily as needed.   Past Week   rosuvastatin (CRESTOR) 20 MG tablet TAKE 1 TABLET(20 MG) BY MOUTH DAILY (Patient taking differently: Take 20 mg by mouth daily.) 90 tablet 0 Past Week   sodium bicarbonate 650 MG tablet Take 650 mg by mouth 2 (two) times daily.   Past Week   venlafaxine (EFFEXOR) 25 MG tablet Take 25 mg by mouth 2 (two) times daily.   08/05/2022 at 0400   Blood Glucose Monitoring Suppl (ACCU-CHEK GUIDE) w/Device KIT 1 Piece by Does not apply route as directed. (Patient not taking: Reported on 07/27/2022) 1 kit 0 Not Taking   glucose blood (ACCU-CHEK GUIDE) test strip Use as instructed  (Patient not taking: Reported on 07/27/2022) 150 each 2 Not Taking  Insulin Pen Needle (PEN NEEDLES) 31G X 6 MM MISC Use to inject insulin once daily (Patient not taking: Reported on 07/27/2022) 100 each 3 Not Taking   metoprolol tartrate (LOPRESSOR) 25 MG tablet Take 0.5 tablets (12.5 mg total) by mouth 2 (two) times daily. (Patient not taking: Reported on 07/27/2022) 90 tablet 3 Not Taking   nicotine (NICODERM CQ - DOSED IN MG/24 HOURS) 21 mg/24hr patch Place 1 patch (21 mg total) onto the skin daily. (Patient not taking: Reported on 07/27/2022) 30 patch 1 Not Taking   pantoprazole (PROTONIX) 40 MG tablet Take 1 tablet (40 mg total) by mouth daily. 30 tablet 0     Current Facility-Administered Medications  Medication Dose Route Frequency Provider Last Rate Last Admin   0.9 %  sodium chloride infusion   Intravenous Continuous Karie Soda, MD 10 mL/hr at 08/05/22 0631 New Bag at 08/05/22 0631   bupivacaine liposome (EXPAREL) 1.3 % injection 266 mg  20 mL Infiltration Once Karie Soda, MD       ceFAZolin (ANCEF) IVPB 2g/100 mL premix  2 g Intravenous On Call to OR Karie Soda, MD       Chlorhexidine Gluconate Cloth 2 % PADS 6 each  6 each Topical Once Karie Soda, MD       And   Chlorhexidine Gluconate Cloth 2 % PADS 6 each  6 each Topical Once Karie Soda, MD       gabapentin (NEURONTIN) capsule 300 mg  300 mg Oral On Call to OR Karie Soda, MD       lactated ringers infusion   Intravenous Continuous Kaylyn Layer, MD         Allergies  Allergen Reactions   Codeine Shortness Of Breath   Latex Itching   Sulfa Antibiotics Other (See Comments)    Reaction:  Unknown     BP (!) 148/61   Pulse (!) 59   Temp 98.4 F (36.9 C) (Oral)   Resp 16   Ht 5\' 6"  (1.676 m)   Wt 61.7 kg   SpO2 93%   BMI 21.95 kg/m   Labs: Results for orders placed or performed during the hospital encounter of 08/05/22 (from the past 48 hour(s))  Glucose, capillary     Status: Abnormal   Collection  Time: 08/05/22  6:20 AM  Result Value Ref Range   Glucose-Capillary 150 (H) 70 - 99 mg/dL    Comment: Glucose reference range applies only to samples taken after fasting for at least 8 hours.   Comment 1 Notify RN    Comment 2 Document in Chart     Imaging / Studies: No results found.   Ardeth Sportsman, M.D., F.A.C.S. Gastrointestinal and Minimally Invasive Surgery [facility] [address], [address] [place], [place] [address] [phone] Main / Paging  08/05/2022 6:46 AM    Ardeth Sportsman

## 2022-08-06 ENCOUNTER — Encounter (HOSPITAL_COMMUNITY): Payer: Self-pay | Admitting: Surgery

## 2022-08-06 LAB — POTASSIUM: Potassium: 5.2 mmol/L — ABNORMAL HIGH (ref 3.5–5.1)

## 2022-08-06 LAB — CREATININE, SERUM
Creatinine, Ser: 2.98 mg/dL — ABNORMAL HIGH (ref 0.44–1.00)
GFR, Estimated: 17 mL/min — ABNORMAL LOW (ref >60)

## 2022-08-06 LAB — GLUCOSE, CAPILLARY: Glucose-Capillary: 143 mg/dL — ABNORMAL HIGH (ref 70–99)

## 2022-08-06 LAB — HEMOGLOBIN: Hemoglobin: 10 g/dL — ABNORMAL LOW (ref 12.0–15.0)

## 2022-08-06 NOTE — Progress Notes (Signed)
Patient ID: Grace Hess, female   DOB: 12/26/1955, 67 y.o.   MRN: [id number]   Pain well controlled Drain serosang Abdomens soft Low O2 sats expected given her smoking history and home O2 No resp distress  Plan; Discharge home with the drain

## 2022-08-06 NOTE — Progress Notes (Signed)
Pt and husband given d/c instructions and all questions answered. Thorough instructions were given on how to manage JP drain. Pt and husband agreeable to manage. Pt taken to front entrance via wheelchair by NT to meet ride.

## 2022-08-06 NOTE — Discharge Summary (Signed)
Physician Discharge Summary  Patient ID: LUDY HEFTY MRN: [id number] DOB/AGE: 1956-01-28 67 y.o.  Admit date: 08/05/2022 Discharge date: 08/06/2022  Admission Diagnoses:  Discharge Diagnoses:  Principal Problem:   Incarcerated incisional hernia   Discharged Condition: good  Hospital Course: uneventful postop recovery.  Discharged home POD#1  Consults: None  Significant Diagnostic Studies:   Treatments: surgery: see op note for lap hernia repair  Discharge Exam: Blood pressure (!) 105/45, pulse 73, temperature 98.5 F (36.9 C), resp. rate 18, height 5\' 6"  (1.676 m), weight 61.7 kg, SpO2 92 %. General appearance: alert, cooperative, and no distress Resp: clear to auscultation bilaterally Cardio: regular rate and rhythm, S1, S2 normal, no murmur, click, rub or gallop Incision/Wound:incisions clean, drain serosang  Disposition: Discharge disposition: 01-Home or Self Care       Discharge Instructions     Call MD for:   Complete by: As directed    FEVER > 101.5 F  (temperatures < 101.5 F are not significant)   Call MD for:  extreme fatigue   Complete by: As directed    Call MD for:  persistant dizziness or light-headedness   Complete by: As directed    Call MD for:  persistant nausea and vomiting   Complete by: As directed    Call MD for:  redness, tenderness, or signs of infection (pain, swelling, redness, odor or green/yellow discharge around incision site)   Complete by: As directed    Call MD for:  severe uncontrolled pain   Complete by: As directed    Diet - low sodium heart healthy   Complete by: As directed    Start with a bland diet such as soups, liquids, starchy foods, low fat foods, etc. the first few days at home. Gradually advance to a solid, low-fat, high fiber diet by the end of the first week at home.   Add a fiber supplement to your diet (Metamucil, etc) If you feel full, bloated, or constipated, stay on a full liquid or pureed/blenderized diet  for a few days until you feel better and are no longer constipated.   Discharge instructions   Complete by: As directed    See Discharge Instructions If you are not getting better after two weeks or are noticing you are getting worse, contact our office [phone] for further advice.  We may need to adjust your medications, re-evaluate you in the office, send you to the emergency room, or see what other things we can do to help. The clinic staff is available to answer your questions during regular business hours (8:30am-5pm).  Please don't hesitate to call and ask to speak to one of our nurses for clinical concerns.    A surgeon from [facility] is always on call at the hospitals 24 hours/day If you have a medical emergency, go to the nearest emergency room or call 911.   Discharge wound care:   Complete by: As directed    It is good for closed incisions and even open wounds to be washed every day.  Shower every day.  Short baths are fine.  Wash the incisions and wounds clean with soap & water.    You may leave closed incisions open to air if it is dry.   You may cover the incision with clean gauze & replace it after your daily shower for comfort.  TEGADERM & STERISTRIPS:  You have clear gauze band-aid dressings over your closed incision(s).  You also have skin tapes called  Steristrips on your incisions.  Leave these in place.  You may trim any edges that curl up with clean scissors.  You may remove all dressings & tapes in the shower in a week.   Driving Restrictions   Complete by: As directed    You may drive when: - you are no longer taking narcotic prescription pain medication - you can comfortably wear a seatbelt - you can safely make sudden turns/stops without pain.   Increase activity slowly   Complete by: As directed    Start light daily activities --- self-care, walking, climbing stairs- beginning the day after surgery.  Gradually increase activities as tolerated.   Control your pain to be active.  Stop when you are tired.  Ideally, walk several times a day, eventually an hour a day.   Most people are back to most day-to-day activities in a few weeks.  It takes 4-6 weeks to get back to unrestricted, intense activity. If you can walk 30 minutes without difficulty, it is safe to try more intense activity such as jogging, treadmill, bicycling, low-impact aerobics, swimming, etc. Save the most intensive and strenuous activity for last (Usually 4-8 weeks after surgery) such as sit-ups, heavy lifting, contact sports, etc.  Refrain from any intense heavy lifting or straining until you are off narcotics for pain control.  You will have off days, but things should improve week-by-week. DO NOT PUSH THROUGH PAIN.  Let pain be your guide: If it hurts to do something, don't do it.   Lifting restrictions   Complete by: As directed    If you can walk 30 minutes without difficulty, it is safe to try more intense activity such as jogging, treadmill, bicycling, low-impact aerobics, swimming, etc. Save the most intensive and strenuous activity for last (Usually 4-8 weeks after surgery) such as sit-ups, heavy lifting, contact sports, etc.   Refrain from any intense heavy lifting or straining until you are off narcotics for pain control.  You will have off days, but things should improve week-by-week. DO NOT PUSH THROUGH PAIN.  Let pain be your guide: If it hurts to do something, don't do it.  Pain is your body warning you to avoid that activity for another week until the pain goes down.   May shower / Bathe   Complete by: As directed    May walk up steps   Complete by: As directed    Remove dressing in 72 hours   Complete by: As directed    Make sure all dressings are removed by the third day after surgery.  Leave incisions open to air.  OK to cover incisions with gauze or bandages as desired   Sexual Activity Restrictions   Complete by: As directed    You may have sexual  intercourse when it is comfortable. If it hurts to do something, stop.      Allergies as of 08/06/2022       Reactions   Codeine Shortness Of Breath   Latex Itching   Sulfa Antibiotics Other (See Comments)   Reaction:  Unknown         Medication List     TAKE these medications    Accu-Chek Guide test strip Generic drug: glucose blood Use as instructed   Accu-Chek Guide w/Device Kit 1 Piece by Does not apply route as directed.   acetaminophen 325 MG tablet Commonly known as: TYLENOL Take 2 tablets (650 mg total) by mouth every 4 (four) hours as needed for headache or  mild pain.   albuterol 108 (90 Base) MCG/ACT inhaler Commonly known as: VENTOLIN HFA Inhale 1-2 puffs into the lungs every 4 (four) hours as needed for shortness of breath or wheezing.   ALPRAZolam 0.5 MG tablet Commonly known as: XANAX Take 0.25 mg by mouth 2 (two) times daily.   amLODipine 5 MG tablet Commonly known as: NORVASC Take 1 tablet (5 mg total) by mouth daily.   Biotin 5000 MCG Caps Take 5,000 mcg by mouth daily.   calcitRIOL 0.25 MCG capsule Commonly known as: ROCALTROL Take 0.25 mcg by mouth daily.   calcium acetate 667 MG tablet Commonly known as: PHOSLO Take 667 mg by mouth daily.   clopidogrel 75 MG tablet Commonly known as: PLAVIX Take 1 tablet (75 mg total) by mouth daily.   escitalopram 20 MG tablet Commonly known as: LEXAPRO Take 20 mg by mouth daily.   gabapentin 300 MG capsule Commonly known as: NEURONTIN Take 300 mg by mouth at bedtime.   Lokelma 10 g Pack packet Generic drug: sodium zirconium cyclosilicate Take 10 g by mouth 3 (three) times a week.   metoprolol tartrate 25 MG tablet Commonly known as: LOPRESSOR Take 0.5 tablets (12.5 mg total) by mouth 2 (two) times daily.   nicotine 21 mg/24hr patch Commonly known as: NICODERM CQ - dosed in mg/24 hours Place 1 patch (21 mg total) onto the skin daily.   nystatin cream Commonly known as:  MYCOSTATIN Apply 1 application  topically 2 (two) times daily as needed (Itching).   ondansetron 4 MG tablet Commonly known as: ZOFRAN TAKE 1 TABLET(4 MG) BY MOUTH TWICE DAILY AS NEEDED FOR NAUSEA OR VOMITING What changed:  how much to take how to take this when to take this reasons to take this additional instructions   oxyCODONE-acetaminophen 5-325 MG tablet Commonly known as: PERCOCET/ROXICET Take 1 tablet by mouth every 6 (six) hours as needed for severe pain or moderate pain. What changed:  when to take this reasons to take this   pantoprazole 40 MG tablet Commonly known as: PROTONIX Take 1 tablet (40 mg total) by mouth daily.   Pen Needles 31G X 6 MM Misc Use to inject insulin once daily   promethazine 25 MG tablet Commonly known as: PHENERGAN Take 25 mg by mouth 4 (four) times daily as needed.   rosuvastatin 20 MG tablet Commonly known as: CRESTOR TAKE 1 TABLET(20 MG) BY MOUTH DAILY What changed: See the new instructions.   sodium bicarbonate 650 MG tablet Take 650 mg by mouth 2 (two) times daily.   Evaristo Bury FlexTouch 100 UNIT/ML FlexTouch Pen Generic drug: insulin degludec Inject 15 Units into the skin at bedtime. What changed: when to take this   venlafaxine 25 MG tablet Commonly known as: EFFEXOR Take 25 mg by mouth 2 (two) times daily.   Vitamin D3 125 MCG (5000 UT) Caps Take 5,000 Units by mouth once a week. Tuesday               Discharge Care Instructions  (From admission, onward)           Start     Ordered   08/05/22 0000  Discharge wound care:       Comments: It is good for closed incisions and even open wounds to be washed every day.  Shower every day.  Short baths are fine.  Wash the incisions and wounds clean with soap & water.    You may leave closed incisions open to air if it  is dry.   You may cover the incision with clean gauze & replace it after your daily shower for comfort.  TEGADERM & STERISTRIPS:  You have clear gauze  band-aid dressings over your closed incision(s).  You also have skin tapes called Steristrips on your incisions.  Leave these in place.  You may trim any edges that curl up with clean scissors.  You may remove all dressings & tapes in the shower in a week.   08/05/22 0701            Follow-up Information     Karie Soda, MD Follow up on 08/30/2022.   Specialties: General Surgery, Colon and Rectal Surgery Why: To follow up after your operation Contact information: [address] [place] [address] [phone]         [facility] [place] Follow up on 08/16/2022.   Specialty: General Surgery Why: To have your drain removed & incisions re-checked Contact information: [address] [place] [address] [phone]                Signed: Abigail Miyamoto 08/06/2022, 10:26 AM

## 2022-08-16 ENCOUNTER — Ambulatory Visit (INDEPENDENT_AMBULATORY_CARE_PROVIDER_SITE_OTHER): Payer: PPO | Admitting: Gastroenterology

## 2022-08-21 ENCOUNTER — Other Ambulatory Visit: Payer: Self-pay | Admitting: Cardiology

## 2022-08-21 ENCOUNTER — Other Ambulatory Visit (INDEPENDENT_AMBULATORY_CARE_PROVIDER_SITE_OTHER): Payer: Self-pay | Admitting: Gastroenterology

## 2022-08-30 DIAGNOSIS — R42 Dizziness and giddiness: Secondary | ICD-10-CM | POA: Diagnosis not present

## 2022-08-30 DIAGNOSIS — Z7901 Long term (current) use of anticoagulants: Secondary | ICD-10-CM | POA: Diagnosis not present

## 2022-08-30 DIAGNOSIS — Z85528 Personal history of other malignant neoplasm of kidney: Secondary | ICD-10-CM | POA: Diagnosis not present

## 2022-08-30 DIAGNOSIS — Z720 Tobacco use: Secondary | ICD-10-CM | POA: Diagnosis not present

## 2022-08-30 DIAGNOSIS — K432 Incisional hernia without obstruction or gangrene: Secondary | ICD-10-CM | POA: Diagnosis not present

## 2022-08-30 DIAGNOSIS — Z8673 Personal history of transient ischemic attack (TIA), and cerebral infarction without residual deficits: Secondary | ICD-10-CM | POA: Diagnosis not present

## 2022-09-06 ENCOUNTER — Other Ambulatory Visit (INDEPENDENT_AMBULATORY_CARE_PROVIDER_SITE_OTHER): Payer: Self-pay | Admitting: Gastroenterology

## 2022-09-17 DIAGNOSIS — R809 Proteinuria, unspecified: Secondary | ICD-10-CM | POA: Diagnosis not present

## 2022-09-17 DIAGNOSIS — N1832 Chronic kidney disease, stage 3b: Secondary | ICD-10-CM | POA: Diagnosis not present

## 2022-09-17 DIAGNOSIS — I129 Hypertensive chronic kidney disease with stage 1 through stage 4 chronic kidney disease, or unspecified chronic kidney disease: Secondary | ICD-10-CM | POA: Diagnosis not present

## 2022-09-17 DIAGNOSIS — D631 Anemia in chronic kidney disease: Secondary | ICD-10-CM | POA: Diagnosis not present

## 2022-09-20 NOTE — Progress Notes (Signed)
Cardiology Office Note:  .   Date:  09/27/2022  ID:  Grace Hess, DOB 11-02-1955, MRN [id number] PCP: Grace Nevins, Grace Hess  [facility] Providers Cardiologist:  Grace Rich, Grace Hess    History of Present Illness: .   Grace Hess is a 67 y.o. female  with history of CAD (s/p DES to RCA in 07/2016 with residual mid-LAD stenosis), PAD (known CTO of left SFA), carotid artery stenosis (s/p L CEA), HTN, HLD, Type 2 DM and tobacco use.   She had bradycardia at OV 04/2022 and metoprolol decreased 12.5 mg bid. Echo normal LVEF 60-65%. F/u OV 05/2022 was told to hold metoprolol for 2 weeks to see if fatigue and bradycardia improve.   Patient has been dizzy and has fallen 3 times. Hasn't passed out. Each occasion she was already walking. Was put on lasix on Friday for some swelling and has been very dizzy since. She is nearing dialysis per patient. K has been running high as well.  She is orthostatic in the office today.. She drinks 7-8 good size glasses of water daily. Smoking 1 1/2 ppd.   ROS:    Studies Reviewed: Marland Kitchen         Prior CV Studies:   Echocardiogram: 06/02/2022     Risk Assessment/Calculations:             Physical Exam:   VS:  BP 110/60 (BP Location: Left Arm, Patient Position: Supine, Cuff Size: Normal)   Pulse 74   Ht 5\' 7"  (1.702 m)   Wt 136 lb (61.7 kg)   SpO2 95%   BMI 21.30 kg/m    Wt Readings from Last 3 Encounters:  09/27/22 136 lb (61.7 kg)  08/05/22 136 lb (61.7 kg)  07/27/22 136 lb (61.7 kg)    GEN: Thin, in no acute distress NECK: No JVD; No carotid bruits CARDIAC:  RRR, no murmurs, rubs, gallops RESPIRATORY:  Clear to auscultation without rales, wheezing or rhonchi  ABDOMEN: Soft, non-tender, non-distended EXTREMITIES:  No edema; No deformity   ASSESSMENT AND PLAN: .    Orthostatic hypotension. BP lying105/55 P 70 to sitting 80/48 P 73 to standing 79/47 P84. Will stop lasix and hold amlodipine for 2 days. If BP comes up can restart  amlodipine 2.5 mg daily. Already scheduled for f/u blood work with PCP and renal so won't repeat. Denies bleeding issues and staying hydrated.   CAD - s/p DES to RCA in 07/2016 with residual mid-LAD stenosis. She has baseline dyspnea on exertion but no chest pain.  - Continue current medical therapy with Plavix 75 mg daily and Crestor 20 mg daily.   -smoking cessation discussed    Carotid Artery Stenosis  - She is s/p L CEA and dopplers in 04/2022 showed 60-79% RICA stenosis and 1-39% LICA stenosis. Followed by Vascular. Remains on Plavix and statin therapy.     Bradycardia - HR was in the 40's at OV 04/2022 with Lopressor was reduced to 12.5 mg twice daily. Heart rate is in the 50's at f/u 05/2022 and was told to hold Metoprolol for a few weeks to see if her heart rate and fatigue improve. Fatigue improved and she never started it back.     HTN - BP is running low-see above  Tobacco abuse-smoking cessation discussed  CKD followed by Dr. Wolfgang Phoenix. Patient states that she is nearing dialysis.          Dispo: f/u in 1 week for orthostatics.  Signed, Jacolyn Reedy, PA-C

## 2022-09-23 DIAGNOSIS — I129 Hypertensive chronic kidney disease with stage 1 through stage 4 chronic kidney disease, or unspecified chronic kidney disease: Secondary | ICD-10-CM | POA: Diagnosis not present

## 2022-09-23 DIAGNOSIS — E1122 Type 2 diabetes mellitus with diabetic chronic kidney disease: Secondary | ICD-10-CM | POA: Diagnosis not present

## 2022-09-23 DIAGNOSIS — N2581 Secondary hyperparathyroidism of renal origin: Secondary | ICD-10-CM | POA: Diagnosis not present

## 2022-09-23 DIAGNOSIS — N184 Chronic kidney disease, stage 4 (severe): Secondary | ICD-10-CM | POA: Diagnosis not present

## 2022-09-27 ENCOUNTER — Ambulatory Visit: Payer: PPO | Attending: Physician Assistant | Admitting: Physician Assistant

## 2022-09-27 ENCOUNTER — Encounter: Payer: Self-pay | Admitting: Physician Assistant

## 2022-09-27 VITALS — BP 110/60 | HR 74 | Ht 67.0 in | Wt 136.0 lb

## 2022-09-27 DIAGNOSIS — I6529 Occlusion and stenosis of unspecified carotid artery: Secondary | ICD-10-CM | POA: Diagnosis not present

## 2022-09-27 DIAGNOSIS — R001 Bradycardia, unspecified: Secondary | ICD-10-CM | POA: Diagnosis not present

## 2022-09-27 DIAGNOSIS — I2510 Atherosclerotic heart disease of native coronary artery without angina pectoris: Secondary | ICD-10-CM | POA: Diagnosis not present

## 2022-09-27 DIAGNOSIS — I951 Orthostatic hypotension: Secondary | ICD-10-CM

## 2022-09-27 DIAGNOSIS — I10 Essential (primary) hypertension: Secondary | ICD-10-CM

## 2022-09-27 DIAGNOSIS — Z720 Tobacco use: Secondary | ICD-10-CM

## 2022-09-27 DIAGNOSIS — N184 Chronic kidney disease, stage 4 (severe): Secondary | ICD-10-CM

## 2022-09-27 MED ORDER — AMLODIPINE BESYLATE 2.5 MG PO TABS: 2.5000 mg | ORAL_TABLET | Freq: Every day | ORAL | 3 refills | Status: DC

## 2022-09-27 NOTE — Patient Instructions (Signed)
Medication Instructions:   Stop Taking Lasix  Do not Take Amlodipine on Tuesday or Wednesday  On Thursday Decrease Amlodipine to 2.5 mg Daily   *If you need a refill on your cardiac medications before your next appointment, please call your pharmacy*   Lab Work: NONE   If you have labs (blood work) drawn today and your tests are completely normal, you will receive your results only by: MyChart Message (if you have MyChart) OR A paper copy in the mail If you have any lab test that is abnormal or we need to change your treatment, we will call you to review the results.   Testing/Procedures: NONE    Follow-Up: At [facility], you and your health needs are our priority.  As part of our continuing mission to provide you with exceptional heart care, we have created designated Provider Care Teams.  These Care Teams include your primary Cardiologist (physician) and Advanced Practice Providers (APPs -  Physician Assistants and Nurse Practitioners) who all work together to provide you with the care you need, when you need it.  We recommend signing up for the patient portal called "MyChart".  Sign up information is provided on this After Visit Summary.  MyChart is used to connect with patients for Virtual Visits (Telemedicine).  Patients are able to view lab/test results, encounter notes, upcoming appointments, etc.  Non-urgent messages can be sent to your provider as well.   To learn more about what you can do with MyChart, go to [email or web address].    Your next appointment:   1 week(s)  Provider:   Nurse Visit     Other Instructions Thank you for choosing [facility]!

## 2022-10-04 ENCOUNTER — Ambulatory Visit: Payer: PPO | Attending: Cardiology | Admitting: *Deleted

## 2022-10-04 DIAGNOSIS — Z0001 Encounter for general adult medical examination with abnormal findings: Secondary | ICD-10-CM | POA: Diagnosis not present

## 2022-10-04 DIAGNOSIS — F411 Generalized anxiety disorder: Secondary | ICD-10-CM | POA: Diagnosis not present

## 2022-10-04 DIAGNOSIS — Z1331 Encounter for screening for depression: Secondary | ICD-10-CM | POA: Diagnosis not present

## 2022-10-04 DIAGNOSIS — I10 Essential (primary) hypertension: Secondary | ICD-10-CM | POA: Diagnosis not present

## 2022-10-04 DIAGNOSIS — E1165 Type 2 diabetes mellitus with hyperglycemia: Secondary | ICD-10-CM | POA: Diagnosis not present

## 2022-10-04 DIAGNOSIS — Z6823 Body mass index (BMI) 23.0-23.9, adult: Secondary | ICD-10-CM | POA: Diagnosis not present

## 2022-10-04 DIAGNOSIS — I739 Peripheral vascular disease, unspecified: Secondary | ICD-10-CM | POA: Diagnosis not present

## 2022-10-04 DIAGNOSIS — N184 Chronic kidney disease, stage 4 (severe): Secondary | ICD-10-CM | POA: Diagnosis not present

## 2022-10-04 DIAGNOSIS — K219 Gastro-esophageal reflux disease without esophagitis: Secondary | ICD-10-CM | POA: Diagnosis not present

## 2022-10-04 DIAGNOSIS — M1991 Primary osteoarthritis, unspecified site: Secondary | ICD-10-CM | POA: Diagnosis not present

## 2022-10-04 DIAGNOSIS — Z1389 Encounter for screening for other disorder: Secondary | ICD-10-CM | POA: Diagnosis not present

## 2022-10-04 NOTE — Progress Notes (Signed)
Pt in office for orthostatic vitals.

## 2022-10-05 ENCOUNTER — Telehealth: Payer: Self-pay

## 2022-10-05 NOTE — Telephone Encounter (Signed)
-----   Message from Jacolyn Reedy sent at 10/05/2022 11:23 AM EDT ----- Patient is still orthostatic. Please have her stop amlodipine completely and make sure she's staying hydrated. Compression hose. Come back next week for orthostatics with nurse visit. thanks ----- Message ----- From: Kerney Elbe, LPN Sent: 9/60/4540   4:14 PM EDT To: Dyann Kief, PA-C

## 2022-10-05 NOTE — Telephone Encounter (Signed)
-----   Message from Grace Hess sent at 10/05/2022 11:23 AM EDT ----- Patient is still orthostatic. Please have her stop amlodipine completely and make sure she's staying hydrated. Compression hose. Come back next week for orthostatics with nurse visit. thanks ----- Message ----- From: Kerney Elbe, LPN Sent: 9/60/4540   4:14 PM EDT To: Dyann Kief, PA-C

## 2022-10-05 NOTE — Telephone Encounter (Signed)
Spoke with patient and she will stop amlodipine all together and will increase fluid intake and purchase some compression stockings. Nurse appointment is 10/15/22 at 4:15 pm  for repeat orthostatics

## 2022-10-11 ENCOUNTER — Inpatient Hospital Stay: Payer: PPO | Attending: Hematology

## 2022-10-11 DIAGNOSIS — Z85528 Personal history of other malignant neoplasm of kidney: Secondary | ICD-10-CM | POA: Diagnosis not present

## 2022-10-11 DIAGNOSIS — N189 Chronic kidney disease, unspecified: Secondary | ICD-10-CM | POA: Insufficient documentation

## 2022-10-11 DIAGNOSIS — C642 Malignant neoplasm of left kidney, except renal pelvis: Secondary | ICD-10-CM

## 2022-10-11 DIAGNOSIS — D631 Anemia in chronic kidney disease: Secondary | ICD-10-CM | POA: Insufficient documentation

## 2022-10-11 LAB — CBC
HCT: 32.6 % — ABNORMAL LOW (ref 36.0–46.0)
Hemoglobin: 10.2 g/dL — ABNORMAL LOW (ref 12.0–15.0)
MCH: 29.4 pg (ref 26.0–34.0)
MCHC: 31.3 g/dL (ref 30.0–36.0)
MCV: 93.9 fL (ref 80.0–100.0)
Platelets: 310 10*3/uL (ref 150–400)
RBC: 3.47 MIL/uL — ABNORMAL LOW (ref 3.87–5.11)
RDW: 14.5 % (ref 11.5–15.5)
WBC: 12.2 10*3/uL — ABNORMAL HIGH (ref 4.0–10.5)
nRBC: 0 % (ref 0.0–0.2)

## 2022-10-11 LAB — IRON AND TIBC
Iron: 43 ug/dL (ref 28–170)
Saturation Ratios: 19 % (ref 10.4–31.8)
TIBC: 231 ug/dL — ABNORMAL LOW (ref 250–450)
UIBC: 188 ug/dL

## 2022-10-11 LAB — FERRITIN: Ferritin: 121 ng/mL (ref 11–307)

## 2022-10-15 ENCOUNTER — Ambulatory Visit: Payer: PPO | Attending: Internal Medicine | Admitting: *Deleted

## 2022-10-15 DIAGNOSIS — I951 Orthostatic hypotension: Secondary | ICD-10-CM | POA: Diagnosis not present

## 2022-10-15 NOTE — Progress Notes (Signed)
Pt in office for orthostatics. Pt states that she feels fine at visit. Does state that she has been feeling dizzy today.

## 2022-10-18 ENCOUNTER — Inpatient Hospital Stay: Payer: PPO | Attending: Hematology | Admitting: Hematology

## 2022-10-18 ENCOUNTER — Telehealth: Payer: Self-pay | Admitting: *Deleted

## 2022-10-18 VITALS — BP 114/57 | HR 79 | Temp 98.2°F | Resp 20 | Wt 139.1 lb

## 2022-10-18 DIAGNOSIS — F17210 Nicotine dependence, cigarettes, uncomplicated: Secondary | ICD-10-CM | POA: Insufficient documentation

## 2022-10-18 DIAGNOSIS — C642 Malignant neoplasm of left kidney, except renal pelvis: Secondary | ICD-10-CM | POA: Insufficient documentation

## 2022-10-18 DIAGNOSIS — D631 Anemia in chronic kidney disease: Secondary | ICD-10-CM | POA: Diagnosis not present

## 2022-10-18 DIAGNOSIS — N189 Chronic kidney disease, unspecified: Secondary | ICD-10-CM | POA: Insufficient documentation

## 2022-10-18 NOTE — Patient Instructions (Signed)
[facility]  Discharge Instructions  You were seen and examined today by Dr. Ellin Saba.  Dr. Ellin Saba discussed your most recent lab work which is stable. Dr. Ellin Saba will repeat a CT scan in 3 months.  Follow-up as scheduled.  Thank you for choosing [facility] to provide your oncology and hematology care.   To afford each patient quality time with our provider, please arrive at least 15 minutes before your scheduled appointment time. You may need to reschedule your appointment if you arrive late (10 or more minutes). Arriving late affects you and other patients whose appointments are after yours.  Also, if you miss three or more appointments without notifying the office, you may be dismissed from the clinic at the provider's discretion.    Again, thank you for choosing [facility].  Our hope is that these requests will decrease the amount of time that you wait before being seen by our physicians.   If you have a lab appointment with the Cancer Center - please note that after April 8th, all labs will be drawn in the cancer center.  You do not have to check in or register with the main entrance as you have in the past but will complete your check-in at the cancer center.            _____________________________________________________________  Should you have questions after your visit to [facility], please contact our office at [phone] and follow the prompts.  Our office hours are 8:00 a.m. to 4:30 p.m. Monday - Thursday and 8:00 a.m. to 2:30 p.m. Friday.  Please note that voicemails left after 4:00 p.m. may not be returned until the following business day.  We are closed weekends and all major holidays.  You do have access to a nurse 24-7, just call the main number to the clinic [phone] and do not press any options, hold on the line and a nurse will answer the phone.    For prescription refill  requests, have your pharmacy contact our office and allow 72 hours.    Masks are no longer required in the cancer centers. If you would like for your care team to wear a mask while they are taking care of you, please let them know. You may have one support person who is at least 67 years old accompany you for your appointments.

## 2022-10-18 NOTE — Telephone Encounter (Signed)
-----   Message from Jacolyn Reedy sent at 10/18/2022  7:42 AM EDT ----- It looks like she's still orthostatic. Make sure she's not on amlodipine or any BP meds at this point. Stay hydrated, can wear compression hose. thanks ----- Message ----- From: Kerney Elbe, LPN Sent: 05/15/4399   4:49 PM EDT To: Dyann Kief, PA-C

## 2022-10-18 NOTE — Telephone Encounter (Signed)
Pt notified of response from [facility], [place]. Pt states that she is not taking any blood pressure meds. Pt thankful for the call.

## 2022-10-18 NOTE — Progress Notes (Signed)
[facility] [address] [place] [address]    Clinic Day:  10/18/2022  Referring physician: Elfredia Nevins, MD  Patient Care Team: Elfredia Nevins, MD as PCP - General (Internal Medicine) Wyline Mood Dorothe Pea, MD as PCP - Cardiology (Cardiology) Doreatha Massed, MD as Medical Oncologist (Medical Oncology) Crist Fat, MD as Attending Physician (Urology) Dani Gobble, NP as Nurse Practitioner (Endocrinology) Karie Soda, MD as Consulting Physician (General Surgery)   ASSESSMENT & PLAN:   Assessment: 1. 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  Moderate to marked severity left-sided hydronephrosis.  Numerous bilateral nonobstructing renal calculi. - MRI abdomen (08/14/2021): Central lower pole left kidney 4 x 3.7 x 5.3 cm enhancing mass, extension into the proximal left ureter.  No renal vein involvement or evidence of abdominal metastatic disease.  Multiple small abdominal retroperitoneal lymph nodes, nonpathologic by size criteria.  Right abdominal wall subcutaneous nodule nonspecific but new since 07/13/2021. - Left kidney biopsy (09/10/2021): Clear-cell renal cell carcinoma, nuclear grade 2.  No tumor necrosis. - Left nephrectomy on 11/25/2021: Pathology 3.2 cm clear-cell renal cell carcinoma, grade 4, negative sarcoid features, focal rhabdoid features, tumor necrosis negative.  PT1APN0.   2. Social/family history: - She lives at home with her husband.  She retired after working in [employer].  She is current active smoker, 2 packs/day for 48 years. - Brother had throat cancer and was smoker.  Maternal uncle died of cancer.  Paternal uncle also had cancer.  Maternal grandmother had lung cancer.    Plan: 1. Stage I (T1 aN0 M0) left  kidney clear-cell renal cell carcinoma, grade 4: - CT CAP on 07/05/2022: Stable post left nephrectomy without evidence of recurrence. - She had hernia surgery in the interim. - Will plan on seeing her in 3 months with repeat CT CAP without contrast.   2.  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 - She is taking iron tablet daily. - Labs from 10/11/2022: Ferritin 121, percent saturation 19.  Hemoglobin 10.2. - Continue iron tablet daily.  Will check in 3 months.      Orders Placed This Encounter  Procedures   CT CHEST ABDOMEN PELVIS WO CONTRAST    Standing Status:   Future    Standing Expiration Date:   10/18/2023    Order Specific Question:   Preferred imaging location?    Answer:   [facility]    Order Specific Question:   If indicated for the ordered procedure, I authorize the administration of oral contrast media per Radiology protocol    Answer:   Yes    Order Specific Question:   Does the patient have a contrast media/X-ray dye allergy?    Answer:   No    Order Specific Question:   Release to patient    Answer:   Immediate   CBC with Differential    Standing Status:   Future    Standing Expiration Date:   10/18/2023   Comprehensive metabolic panel    Standing Status:   Future    Standing Expiration Date:   10/18/2023   Ferritin    Standing Status:   Future    Standing Expiration Date:   10/18/2023   Iron and TIBC ([facility] ONLY)  Standing Status:   Future    Standing Expiration Date:   10/18/2023      Alben Deeds Teague,acting as a scribe for Doreatha Massed, MD.,have documented all relevant documentation on the behalf of Doreatha Massed, MD,as directed by  Doreatha Massed, MD while in the presence of Doreatha Massed, MD.  I, Doreatha Massed MD, have reviewed the above documentation for accuracy and completeness, and I agree with the above.    Doreatha Massed, MD   8/5/20245:52  PM  CHIEF COMPLAINT:   Diagnosis: left kidney clear cell renal cell carcinoma    Cancer Staging  Renal cell cancer, left [facility]) Staging form: Kidney, AJCC 8th Edition - Clinical stage from 10/01/2021: Stage I (cT1a, cN0, cM0) - Signed by Doreatha Massed, MD on 12/22/2021    Prior Therapy: Left radical nephrectomy on 11/25/2021   Current Therapy:  Surveillance    HISTORY OF PRESENT ILLNESS:   Oncology History  Renal cell cancer, left (HCC)  10/01/2021 Initial Diagnosis   Renal cell cancer, left (HCC)   10/01/2021 Cancer Staging   Staging form: Kidney, AJCC 8th Edition - Clinical stage from 10/01/2021: Stage I (cT1a, cN0, cM0) - Signed by Doreatha Massed, MD on 12/22/2021 Histopathologic type: Clear cell adenocarcinoma, NOS Stage prefix: Initial diagnosis Histologic grade (G): G4 Histologic grading system: 4 grade system Histologic sub-type: Clear cell Sarcomatoid features: Absent Rhabdoid features: Present Tumor necrosis: Absent      INTERVAL HISTORY:   Grace Hess is a 67 y.o. female presenting to clinic today for follow up of left kidney clear cell renal cell carcinoma. She was last seen by me on 07/12/22.  Since her last visit, she underwent a hernia repair on 08/05/22 under Dr. Michaell Cowing.  Today, she states that she is doing well overall. Her appetite level is at 100%. Her energy level is at 80%. She is accompanied by her husband.  She reports a normal appetite. She is taking 1x daily oral iron. She denies any new pains. She c/o frequent dizziness and falls she attributes to low blood pressure. She has been wearing compression socks.   PAST MEDICAL HISTORY:   Past Medical History: Past Medical History:  Diagnosis Date   Anxiety    CAD (coronary artery disease)    a. s/p DES to RCA in 07/2016 with residual mid-LAD stenosis   Cancer (HCC)    skin cancer on finger,, cancer left kidney   Cervicalgia    Chest pain, unspecified    Chronic kidney disease     Depression    GERD (gastroesophageal reflux disease)    Headache    "bad ones after my stroke in 2016; only have them when I get bad news now" (08/02/2016)   Heart murmur    Heavy smoker (more than 20 cigarettes per day)    Scheduled for LDCT screening 02/11/15   History of hiatal hernia    History of kidney stones    "no cysto/OR" (12/10/2014)   Hypercholesteremia    Hypertension    Obesity    Palpitations    Reflux esophagitis    Stroke [facility])    "they said I had a  MINI stroke a few months back/MRI" (12/10/2014)   TIA (transient ischemic attack) 11/28/2014   [name] 11/28/2014   Type II diabetes mellitus (HCC)     Surgical History: Past Surgical History:  Procedure Laterality Date   ABDOMINAL AORTOGRAM N/A 08/02/2016   Procedure: Abdominal Aortogram;  Surgeon: Yvonne Kendall, MD;  Location: [facility] INVASIVE [facility] LAB;  Service: Cardiovascular;  Laterality: N/A;   CAROTID STENT     CHOLECYSTECTOMY OPEN  1978   COLONOSCOPY  2010   single diverticulum in sigmoid colon   CORONARY ANGIOPLASTY WITH STENT PLACEMENT  08/02/2016   to RCA   CORONARY STENT INTERVENTION N/A 08/02/2016   Procedure: Coronary Stent Intervention;  Surgeon: Yvonne Kendall, MD;  Location: [facility] CV [facility];  Service: Cardiovascular;  Laterality: N/A;  RCA   CYSTOSCOPY W/ RETROGRADES Bilateral 07/15/2021   Procedure: CYSTOSCOPY WITH RETROGRADE PYELOGRAM;  Surgeon: Milderd Meager., MD;  Location: [facility] ORS;  Service: Urology;  Laterality: Bilateral;   CYSTOSCOPY WITH STENT PLACEMENT Left 07/15/2021   Procedure: CYSTOSCOPY WITH STENT PLACEMENT;  Surgeon: Milderd Meager., MD;  Location: [facility] ORS;  Service: Urology;  Laterality: Left;   ENDARTERECTOMY Left 12/02/2014   Procedure: ENDARTERECTOMY CAROTID;  Surgeon: Larina Earthly, MD;  Location: [facility] OR;  Service: Vascular;  Laterality: Left;   HERNIA REPAIR     LAPAROSCOPIC NEPHRECTOMY Left    LAPAROSCOPIC NEPHRECTOMY Left 11/25/2021   Procedure: LEFT LAPAROSCOPIC  RADICAL NEPHRECTOMY;  Surgeon: Crist Fat, MD;  Location: [facility] ORS;  Service: Urology;  Laterality: Left;   LEFT HEART CATH AND CORONARY ANGIOGRAPHY N/A 08/02/2016   Procedure: Left Heart Cath and Coronary Angiography;  Surgeon: Yvonne Kendall, MD;  Location: [facility];  Service: Cardiovascular;  Laterality: N/A;   LOWER EXTREMITY ANGIOGRAM  08/02/2016   LOWER EXTREMITY ANGIOGRAPHY N/A 08/02/2016   Procedure: Lower Extremity Angiography;  Surgeon: Yvonne Kendall, MD;  Location: [facility];  Service: Cardiovascular;  Laterality: N/A;   NEPHRECTOMY Left    VAGINAL HYSTERECTOMY  1991   "partial"   VENTRAL HERNIA REPAIR N/A 08/05/2022   Procedure: LAPAROSCOPIC VENTRAL WALL HERNIA REPAIR WITH MESH;  Surgeon: Karie Soda, MD;  Location: [facility] ORS;  Service: General;  Laterality: N/A;    Social History: Social History   Socioeconomic History   Marital status: Married    Spouse name: Not on file   Number of children: 2   Years of education: 12   Highest education level: Not on file  Occupational History   Not on file  Tobacco Use   Smoking status: Every Day    Current packs/day: 1.50    Average packs/day: 1.5 packs/day for 48.4 years (72.6 ttl pk-yrs)    Types: Cigarettes    Start date: 05/17/1974   Smokeless tobacco: Never  Vaping Use   Vaping status: Never Used  Substance and Sexual Activity   Alcohol use: No    Alcohol/week: 0.0 standard drinks of alcohol   Drug use: No   Sexual activity: Not Currently  Other Topics Concern   Not on file  Social History Narrative   Patient does not drink caffeine.   Patient is right handed.    Social Determinants of Health   Financial Resource Strain: Low Risk  (05/18/2022)   Received from [facility], [facility]   Overall Financial Resource Strain (CARDIA)    Difficulty of Paying Living Expenses: Not hard at all  Food Insecurity: No Food Insecurity (05/18/2022)   Received from [facility], [facility]    Hunger Vital Sign    Worried About Running Out of Food in the Last Year: Never true    Ran Out of Food in the Last Year: Never true  Transportation Needs: No Transportation Needs (05/18/2022)   Received from [facility], [facility]   [facility] - Transportation    Lack of  Transportation (Medical): No    Lack of Transportation (Non-Medical): No  Physical Activity: Not on file  Stress: Not on file  Social Connections: Unknown (04/08/2022)   Received from [facility]   Social Network    Social Network: Not on file  Intimate Partner Violence: Unknown (04/08/2022)   Received from [facility], [facility]   HITS    Physically Hurt: Not on file    Insult or Talk Down To: Not on file    Threaten Physical Harm: Not on file    Scream or Curse: Not on file    Family History: Family History  Problem Relation Age of Onset   Congestive Heart Failure Mother    COPD Father    Cancer Brother    Cancer Brother    Breast cancer Neg Hx     Current Medications:  Current Outpatient Medications:    acetaminophen (TYLENOL) 325 MG tablet, Take 2 tablets (650 mg total) by mouth every 4 (four) hours as needed for headache or mild pain., Disp: , Rfl:    albuterol (VENTOLIN HFA) 108 (90 Base) MCG/ACT inhaler, Inhale 1-2 puffs into the lungs every 4 (four) hours as needed for shortness of breath or wheezing., Disp: , Rfl:    ALPRAZolam (XANAX) 0.5 MG tablet, Take 0.25 mg by mouth 2 (two) times daily., Disp: , Rfl:    Biotin 5000 MCG CAPS, Take 5,000 mcg by mouth daily., Disp: , Rfl:    Blood Glucose Monitoring Suppl (ACCU-CHEK GUIDE) w/Device KIT, 1 Piece by Does not apply route as directed., Disp: 1 kit, Rfl: 0   calcitRIOL (ROCALTROL) 0.25 MCG capsule, Take 0.25 mcg by mouth daily., Disp: , Rfl:    calcium acetate (PHOSLO) 667 MG tablet, Take 667 mg by mouth daily., Disp: , Rfl:    Cholecalciferol (VITAMIN D3) 125 MCG (5000 UT) CAPS, Take 5,000 Units by mouth once a week. Tuesday,  Disp: , Rfl:    clopidogrel (PLAVIX) 75 MG tablet, Take 1 tablet (75 mg total) by mouth daily., Disp: 30 tablet, Rfl: 3   escitalopram (LEXAPRO) 20 MG tablet, Take 20 mg by mouth daily., Disp: , Rfl:    gabapentin (NEURONTIN) 300 MG capsule, Take 300 mg by mouth at bedtime., Disp: , Rfl:    glucose blood (ACCU-CHEK GUIDE) test strip, Use as instructed, Disp: 150 each, Rfl: 2   insulin degludec (TRESIBA FLEXTOUCH) 100 UNIT/ML FlexTouch Pen, Inject 15 Units into the skin at bedtime., Disp: 12 mL, Rfl: 3   Insulin Pen Needle (PEN NEEDLES) 31G X 6 MM MISC, Use to inject insulin once daily, Disp: 100 each, Rfl: 3   LOKELMA 10 g PACK packet, Take 10 g by mouth 3 (three) times a week., Disp: , Rfl:    nystatin cream (MYCOSTATIN), Apply 1 application  topically 2 (two) times daily as needed (Itching)., Disp: , Rfl: 5   ondansetron (ZOFRAN) 4 MG tablet, TAKE 1 TABLET(4 MG) BY MOUTH TWICE DAILY AS NEEDED FOR NAUSEA OR VOMITING (Patient taking differently: Take 4 mg by mouth every 8 (eight) hours as needed for nausea.), Disp: 20 tablet, Rfl: 0   oxyCODONE-acetaminophen (PERCOCET/ROXICET) 5-325 MG tablet, Take 1 tablet by mouth every 6 (six) hours as needed for severe pain or moderate pain., Disp: 30 tablet, Rfl: 0   pantoprazole (PROTONIX) 40 MG tablet, TAKE 1 TABLET(40 MG) BY MOUTH DAILY, Disp: 30 tablet, Rfl: 2   promethazine (PHENERGAN) 25 MG tablet, Take 25 mg by mouth 4 (four) times daily  as needed., Disp: , Rfl:    rosuvastatin (CRESTOR) 20 MG tablet, TAKE 1 TABLET(20 MG) BY MOUTH DAILY, Disp: 90 tablet, Rfl: 1   sodium bicarbonate 650 MG tablet, Take 650 mg by mouth 2 (two) times daily., Disp: , Rfl:    venlafaxine (EFFEXOR) 25 MG tablet, Take 25 mg by mouth 2 (two) times daily., Disp: , Rfl:    Allergies: Allergies  Allergen Reactions   Codeine Shortness Of Breath   Latex Itching   Sulfa Antibiotics Other (See Comments)    Reaction:  Unknown     REVIEW OF SYSTEMS:   Review of Systems   Constitutional:  Negative for chills, fatigue and fever.  HENT:   Negative for lump/mass, mouth sores, nosebleeds, sore throat and trouble swallowing.   Eyes:  Negative for eye problems.  Respiratory:  Positive for cough and shortness of breath.   Cardiovascular:  Negative for chest pain, leg swelling and palpitations.  Gastrointestinal:  Positive for nausea (occasional). Negative for abdominal pain, constipation, diarrhea and vomiting.  Genitourinary:  Negative for bladder incontinence, difficulty urinating, dysuria, frequency, hematuria and nocturia.   Musculoskeletal:  Positive for back pain (4/10 severity). Negative for arthralgias, flank pain, myalgias and neck pain.  Skin:  Negative for itching and rash.  Neurological:  Positive for dizziness and numbness (tingling in feet). Negative for headaches.  Hematological:  Does not bruise/bleed easily.  Psychiatric/Behavioral:  Negative for depression, sleep disturbance and suicidal ideas. The patient is not nervous/anxious.   All other systems reviewed and are negative.    VITALS:   Blood pressure (!) 114/57, pulse 79, temperature 98.2 F (36.8 C), temperature source Tympanic, resp. rate 20, weight 139 lb 1.6 oz (63.1 kg), SpO2 94%.  Wt Readings from Last 3 Encounters:  10/18/22 139 lb 1.6 oz (63.1 kg)  09/27/22 136 lb (61.7 kg)  08/05/22 136 lb (61.7 kg)    Body mass index is 21.79 kg/m.  Performance status (ECOG): 1 - Symptomatic but completely ambulatory  PHYSICAL EXAM:   Physical Exam Vitals and nursing note reviewed. Exam conducted with a chaperone present.  Constitutional:      Appearance: Normal appearance.  Cardiovascular:     Rate and Rhythm: Normal rate and regular rhythm.     Pulses: Normal pulses.     Heart sounds: Normal heart sounds.  Pulmonary:     Effort: Pulmonary effort is normal.     Breath sounds: Normal breath sounds.  Abdominal:     Palpations: Abdomen is soft. There is no hepatomegaly, splenomegaly  or mass.     Tenderness: There is no abdominal tenderness.  Musculoskeletal:     Right lower leg: No edema.     Left lower leg: No edema.  Lymphadenopathy:     Cervical: No cervical adenopathy.     Right cervical: No superficial, deep or posterior cervical adenopathy.    Left cervical: No superficial, deep or posterior cervical adenopathy.     Upper Body:     Right upper body: No supraclavicular or axillary adenopathy.     Left upper body: No supraclavicular or axillary adenopathy.  Neurological:     General: No focal deficit present.     Mental Status: She is alert and oriented to person, place, and time.  Psychiatric:        Mood and Affect: Mood normal.        Behavior: Behavior normal.     LABS:      Latest Ref Rng & Units 10/11/2022  11:04 AM 08/06/2022    4:49 AM 07/27/2022   10:57 AM  CBC  WBC 4.0 - 10.5 K/uL 12.2   9.5   Hemoglobin 12.0 - 15.0 g/dL 78.4  69.6  29.5   Hematocrit 36.0 - 46.0 % 32.6   34.5   Platelets 150 - 400 K/uL 310   238       Latest Ref Rng & Units 08/06/2022    4:49 AM 07/27/2022   10:57 AM 07/05/2022   12:09 PM  CMP  Glucose 70 - 99 mg/dL  284  132   BUN 8 - 23 mg/dL  59  66   Creatinine 4.40 - 1.00 mg/dL 1.02  7.25  3.66   Sodium 135 - 145 mmol/L  134  137   Potassium 3.5 - 5.1 mmol/L 5.2  5.0  5.5   Chloride 98 - 111 mmol/L  105  105   CO2 22 - 32 mmol/L  19  21   Calcium 8.9 - 10.3 mg/dL  8.5  9.2   Total Protein 6.5 - 8.1 g/dL   7.4   Total Bilirubin 0.3 - 1.2 mg/dL   0.8   Alkaline Phos 38 - 126 U/L   60   AST 15 - 41 U/L   13   ALT 0 - 44 U/L   12      No results found for: "CEA1", "CEA" / No results found for: "CEA1", "CEA" No results found for: "PSA1" No results found for: "YQI347" No results found for: "CAN125"  No results found for: "TOTALPROTELP", "ALBUMINELP", "A1GS", "A2GS", "BETS", "BETA2SER", "GAMS", "MSPIKE", "SPEI" Lab Results  Component Value Date   TIBC 231 (L) 10/11/2022   TIBC 337 07/05/2022   TIBC 307  03/31/2022   FERRITIN 121 10/11/2022   FERRITIN 32 07/05/2022   FERRITIN 59 03/31/2022   IRONPCTSAT 19 10/11/2022   IRONPCTSAT 18 07/05/2022   IRONPCTSAT 13 03/31/2022   Lab Results  Component Value Date   LDH 123 03/31/2022     STUDIES:   No results found.

## 2022-10-26 ENCOUNTER — Ambulatory Visit: Payer: PPO | Admitting: Nurse Practitioner

## 2022-10-26 ENCOUNTER — Encounter: Payer: Self-pay | Admitting: Nurse Practitioner

## 2022-10-26 VITALS — BP 80/54 | HR 85 | Ht 67.0 in | Wt 135.6 lb

## 2022-10-26 DIAGNOSIS — Z794 Long term (current) use of insulin: Secondary | ICD-10-CM | POA: Diagnosis not present

## 2022-10-26 DIAGNOSIS — I10 Essential (primary) hypertension: Secondary | ICD-10-CM

## 2022-10-26 DIAGNOSIS — E1159 Type 2 diabetes mellitus with other circulatory complications: Secondary | ICD-10-CM

## 2022-10-26 DIAGNOSIS — E782 Mixed hyperlipidemia: Secondary | ICD-10-CM | POA: Diagnosis not present

## 2022-10-26 DIAGNOSIS — E559 Vitamin D deficiency, unspecified: Secondary | ICD-10-CM

## 2022-10-26 LAB — POCT GLYCOSYLATED HEMOGLOBIN (HGB A1C): Hemoglobin A1C: 9.1 % — AB (ref 4.0–5.6)

## 2022-10-26 MED ORDER — DEXCOM G7 RECEIVER DEVI: 1.0000 | Freq: Once | 0 refills | Status: AC

## 2022-10-26 MED ORDER — TRESIBA FLEXTOUCH 100 UNIT/ML ~~LOC~~ SOPN: 10.0000 [IU] | PEN_INJECTOR | Freq: Every day | SUBCUTANEOUS | Status: DC

## 2022-10-26 MED ORDER — DEXCOM G7 SENSOR MISC: 1.0000 | 3 refills | Status: DC

## 2022-10-26 NOTE — Progress Notes (Signed)
10/26/2022, 12:04 PM                                Endocrinology follow-up note   Subjective:    Patient ID: Grace Hess, female    DOB: 1955-04-05.  Grace Hess is being seen in follow-up for management of currently uncontrolled symptomatic diabetes requested by  Elfredia Nevins, MD.   Past Medical History:  Diagnosis Date   Anxiety    CAD (coronary artery disease)    a. s/p DES to RCA in 07/2016 with residual mid-LAD stenosis   Cancer (HCC)    skin cancer on finger,, cancer left kidney   Cervicalgia    Chest pain, unspecified    Chronic kidney disease    Depression    GERD (gastroesophageal reflux disease)    Headache    "bad ones after my stroke in 2016; only have them when I get bad news now" (08/02/2016)   Heart murmur    Heavy smoker (more than 20 cigarettes per day)    Scheduled for LDCT screening 02/11/15   History of hiatal hernia    History of kidney stones    "no cysto/OR" (12/10/2014)   Hypercholesteremia    Hypertension    Obesity    Palpitations    Reflux esophagitis    Stroke [facility])    "they said I had a  MINI stroke a few months back/MRI" (12/10/2014)   TIA (transient ischemic attack) 11/28/2014   [name] 11/28/2014   Type II diabetes mellitus (HCC)     Past Surgical History:  Procedure Laterality Date   ABDOMINAL AORTOGRAM N/A 08/02/2016   Procedure: Abdominal Aortogram;  Surgeon: Yvonne Kendall, MD;  Location: [facility];  Service: Cardiovascular;  Laterality: N/A;   CAROTID STENT     CHOLECYSTECTOMY OPEN  1978   COLONOSCOPY  2010   single diverticulum in sigmoid colon   CORONARY ANGIOPLASTY WITH STENT PLACEMENT  08/02/2016   to RCA   CORONARY STENT INTERVENTION N/A 08/02/2016   Procedure: Coronary Stent Intervention;  Surgeon: Yvonne Kendall, MD;  Location: [facility];  Service: Cardiovascular;  Laterality: N/A;  RCA   CYSTOSCOPY W/  RETROGRADES Bilateral 07/15/2021   Procedure: CYSTOSCOPY WITH RETROGRADE PYELOGRAM;  Surgeon: Milderd Meager., MD;  Location: [facility];  Service: Urology;  Laterality: Bilateral;   CYSTOSCOPY WITH STENT PLACEMENT Left 07/15/2021   Procedure: CYSTOSCOPY WITH STENT PLACEMENT;  Surgeon: Milderd Meager., MD;  Location: [facility];  Service: Urology;  Laterality: Left;   ENDARTERECTOMY Left 12/02/2014   Procedure: ENDARTERECTOMY CAROTID;  Surgeon: Larina Earthly, MD;  Location: [facility] OR;  Service: Vascular;  Laterality: Left;   HERNIA REPAIR     LAPAROSCOPIC NEPHRECTOMY Left    LAPAROSCOPIC NEPHRECTOMY Left 11/25/2021   Procedure: LEFT LAPAROSCOPIC RADICAL NEPHRECTOMY;  Surgeon: Crist Fat, MD;  Location: [facility];  Service: Urology;  Laterality: Left;   LEFT HEART CATH AND CORONARY ANGIOGRAPHY N/A 08/02/2016   Procedure: Left Heart Cath and Coronary Angiography;  Surgeon: Yvonne Kendall,  MD;  Location: [facility];  Service: Cardiovascular;  Laterality: N/A;   LOWER EXTREMITY ANGIOGRAM  08/02/2016   LOWER EXTREMITY ANGIOGRAPHY N/A 08/02/2016   Procedure: Lower Extremity Angiography;  Surgeon: Yvonne Kendall, MD;  Location: [facility];  Service: Cardiovascular;  Laterality: N/A;   NEPHRECTOMY Left    VAGINAL HYSTERECTOMY  1991   "partial"   VENTRAL HERNIA REPAIR N/A 08/05/2022   Procedure: LAPAROSCOPIC VENTRAL WALL HERNIA REPAIR WITH MESH;  Surgeon: Karie Soda, MD;  Location: [facility];  Service: General;  Laterality: N/A;    Social History   Socioeconomic History   Marital status: Married    Spouse name: Not on file   Number of children: 2   Years of education: 12   Highest education level: Not on file  Occupational History   Not on file  Tobacco Use   Smoking status: Every Day    Current packs/day: 1.50    Average packs/day: 1.5 packs/day for 48.4 years (72.7 ttl pk-yrs)    Types: Cigarettes    Start date: 05/17/1974   Smokeless tobacco: Never  Vaping Use    Vaping status: Never Used  Substance and Sexual Activity   Alcohol use: No    Alcohol/week: 0.0 standard drinks of alcohol   Drug use: No   Sexual activity: Not Currently  Other Topics Concern   Not on file  Social History Narrative   Patient does not drink caffeine.   Patient is right handed.    Social Determinants of Health   Financial Resource Strain: Low Risk  (05/18/2022)   Received from [facility], [facility]   Overall Financial Resource Strain (CARDIA)    Difficulty of Paying Living Expenses: Not hard at all  Food Insecurity: No Food Insecurity (05/18/2022)   Received from [facility], [facility]   Hunger Vital Sign    Worried About Running Out of Food in the Last Year: Never true    Ran Out of Food in the Last Year: Never true  Transportation Needs: No Transportation Needs (05/18/2022)   Received from [facility], [facility]   PRAPARE - Transportation    Lack of Transportation (Medical): No    Lack of Transportation (Non-Medical): No  Physical Activity: Not on file  Stress: Not on file  Social Connections: Unknown (04/08/2022)   Received from [facility], [facility]   Social Network    Social Network: Not on file    Family History  Problem Relation Age of Onset   Congestive Heart Failure Mother    COPD Father    Cancer Brother    Cancer Brother    Breast cancer Neg Hx     Outpatient Encounter Medications as of 10/26/2022  Medication Sig   acetaminophen (TYLENOL) 325 MG tablet Take 2 tablets (650 mg total) by mouth every 4 (four) hours as needed for headache or mild pain.   albuterol (VENTOLIN HFA) 108 (90 Base) MCG/ACT inhaler Inhale 1-2 puffs into the lungs every 4 (four) hours as needed for shortness of breath or wheezing.   ALPRAZolam (XANAX) 0.5 MG tablet Take 0.25 mg by mouth 2 (two) times daily.   Biotin 5000 MCG CAPS Take 5,000 mcg by mouth daily.   Blood Glucose Monitoring Suppl (ACCU-CHEK GUIDE) w/Device KIT 1 Piece by Does not apply  route as directed.   calcitRIOL (ROCALTROL) 0.25 MCG capsule Take 0.25 mcg by mouth daily.   calcium acetate (PHOSLO) 667 MG tablet Take 667 mg by mouth daily.  Cholecalciferol (VITAMIN D3) 125 MCG (5000 UT) CAPS Take 5,000 Units by mouth once a week. Tuesday   clopidogrel (PLAVIX) 75 MG tablet Take 1 tablet (75 mg total) by mouth daily.   Continuous Glucose Receiver (DEXCOM G7 RECEIVER) DEVI 1 Device by Does not apply route once for 1 dose.   Continuous Glucose Sensor (DEXCOM G7 SENSOR) MISC Inject 1 Application into the skin as directed. Change sensor every 10 days as directed.   escitalopram (LEXAPRO) 20 MG tablet Take 20 mg by mouth daily.   gabapentin (NEURONTIN) 300 MG capsule Take 300 mg by mouth at bedtime.   glucose blood (ACCU-CHEK GUIDE) test strip Use as instructed   Insulin Pen Needle (PEN NEEDLES) 31G X 6 MM MISC Use to inject insulin once daily   LOKELMA 10 g PACK packet Take 10 g by mouth 3 (three) times a week.   nystatin cream (MYCOSTATIN) Apply 1 application  topically 2 (two) times daily as needed (Itching).   ondansetron (ZOFRAN) 4 MG tablet TAKE 1 TABLET(4 MG) BY MOUTH TWICE DAILY AS NEEDED FOR NAUSEA OR VOMITING (Patient taking differently: Take 4 mg by mouth every 8 (eight) hours as needed for nausea.)   oxyCODONE-acetaminophen (PERCOCET/ROXICET) 5-325 MG tablet Take 1 tablet by mouth every 6 (six) hours as needed for severe pain or moderate pain.   pantoprazole (PROTONIX) 40 MG tablet TAKE 1 TABLET(40 MG) BY MOUTH DAILY   promethazine (PHENERGAN) 25 MG tablet Take 25 mg by mouth 4 (four) times daily as needed.   rosuvastatin (CRESTOR) 20 MG tablet TAKE 1 TABLET(20 MG) BY MOUTH DAILY   sodium bicarbonate 650 MG tablet Take 650 mg by mouth 2 (two) times daily.   venlafaxine (EFFEXOR) 25 MG tablet Take 25 mg by mouth 2 (two) times daily.   insulin degludec (TRESIBA FLEXTOUCH) 100 UNIT/ML FlexTouch Pen Inject 10 Units into the skin at bedtime.   [DISCONTINUED] insulin  degludec (TRESIBA FLEXTOUCH) 100 UNIT/ML FlexTouch Pen Inject 15 Units into the skin at bedtime. (Patient not taking: Reported on 10/26/2022)   No facility-administered encounter medications on file as of 10/26/2022.    ALLERGIES: Allergies  Allergen Reactions   Codeine Shortness Of Breath   Latex Itching   Sulfa Antibiotics Other (See Comments)    Reaction:  Unknown     VACCINATION STATUS: Immunization History  Administered Date(s) Administered   Influenza,inj,Quad PF,6+ Mos 11/29/2014   Pneumococcal Polysaccharide-23 11/29/2014    Diabetes She presents for her follow-up diabetic visit. She has type 2 diabetes mellitus. Onset time: She was diagnosed at approximate age of 50 years. Her disease course has been stable. There are no hypoglycemic associated symptoms. Pertinent negatives for hypoglycemia include no confusion, headaches, pallor or seizures. Associated symptoms include fatigue and foot paresthesias. Pertinent negatives for diabetes include no chest pain, no polydipsia, no polyphagia, no polyuria and no weight loss. There are no hypoglycemic complications. Symptoms are stable. Diabetic complications include a CVA, heart disease and nephropathy. Risk factors for coronary artery disease include dyslipidemia, diabetes mellitus, family history, hypertension, tobacco exposure, sedentary lifestyle and post-menopausal. Current diabetic treatment includes diet. She is compliant with treatment most of the time. Her weight is decreasing steadily. She is following a generally healthy diet. When asked about meal planning, she reported none. She has not had a previous visit with a dietitian. She never participates in exercise. Her breakfast blood glucose range is generally 180-200 mg/dl. (She presents today with her meter and logs showing above target glycemic profile overall.  Her POCT A1c today is 9.1%, improving slightly from last visit of 9.5%.  She did stop taking her [place] after she had her  hernia repair surgery as she was not eating much and didn't think she needed it.  ) An ACE inhibitor/angiotensin II receptor blocker is being taken. She does not see a podiatrist.Eye exam is not current.  Hyperlipidemia This is a chronic problem. The current episode started more than 1 year ago. The problem is controlled. Recent lipid tests were reviewed and are normal. Exacerbating diseases include chronic renal disease and diabetes. Factors aggravating her hyperlipidemia include beta blockers, fatty foods and smoking. Pertinent negatives include no chest pain, myalgias or shortness of breath. Current antihyperlipidemic treatment includes statins. The current treatment provides mild improvement of lipids. Compliance problems include adherence to diet and adherence to exercise.  Risk factors for coronary artery disease include diabetes mellitus, dyslipidemia, hypertension, a sedentary lifestyle, post-menopausal and family history.  Hypertension This is a chronic problem. The current episode started more than 1 year ago. The problem has been resolved since onset. The problem is controlled. Pertinent negatives include no chest pain, headaches, palpitations or shortness of breath. There are no associated agents to hypertension. Risk factors for coronary artery disease include dyslipidemia, diabetes mellitus, sedentary lifestyle, smoking/tobacco exposure and post-menopausal state. Past treatments include ACE inhibitors and beta blockers. The current treatment provides moderate improvement. Compliance problems include exercise and diet.  Hypertensive end-organ damage includes kidney disease, CAD/MI and CVA. Identifiable causes of hypertension include chronic renal disease.    Review of systems  Constitutional: + stable body weight,  current Body mass index is 21.24 kg/m. , + fatigue, no subjective hyperthermia, no subjective hypothermia Eyes: no blurry vision, no xerophthalmia ENT: no sore throat, no nodules  palpated in throat, no dysphagia/odynophagia, no hoarseness Cardiovascular: no chest pain, no shortness of breath, no palpitations, no leg swelling Respiratory: no cough, no shortness of breath Gastrointestinal: no nausea/vomiting/diarrhea Musculoskeletal: no muscle/joint aches Skin: no rashes, no hyperemia Neurological: no tremors, no dizziness, + numbness/tingling to BLE (feet) Psychiatric: no depression, no anxiety  Objective:    BP (!) 80/54 (BP Location: Right Arm, Patient Position: Sitting, Cuff Size: Normal) Comment: Retake with Manuel Cuff  Pulse 85   Ht 5\' 7"  (1.702 m)   Wt 135 lb 9.6 oz (61.5 kg)   BMI 21.24 kg/m   Wt Readings from Last 3 Encounters:  10/26/22 135 lb 9.6 oz (61.5 kg)  10/18/22 139 lb 1.6 oz (63.1 kg)  09/27/22 136 lb (61.7 kg)    BP Readings from Last 3 Encounters:  10/26/22 (!) 80/54  10/18/22 (!) 114/57  09/27/22 110/60     Physical Exam- Limited  Constitutional:  Body mass index is 21.24 kg/m. , not in acute distress Eyes:  EOMI, no exophthalmos Musculoskeletal: no gross deformities, strength intact in all four extremities, no gross restriction of joint movements Skin:  no rashes, no hyperemia Neurological: no tremor with outstretched hands  Diabetic Foot Exam - Simple   No data filed    CMP     Component Value Date/Time   NA 134 (L) 07/27/2022 1057   NA 141 09/28/2021 0822   K 5.2 (H) 08/06/2022 0449   CL 105 07/27/2022 1057   CO2 19 (L) 07/27/2022 1057   GLUCOSE 246 (H) 07/27/2022 1057   BUN 59 (H) 07/27/2022 1057   BUN 30 (H) 09/28/2021 0822   CREATININE 2.98 (H) 08/06/2022 0449   CREATININE 1.38 (H) 08/20/2019 0719  CALCIUM 8.5 (L) 07/27/2022 1057   PROT 7.4 07/05/2022 1209   PROT 6.6 09/28/2021 0822   ALBUMIN 3.9 07/05/2022 1209   ALBUMIN 4.2 09/28/2021 0822   AST 13 (L) 07/05/2022 1209   ALT 12 07/05/2022 1209   ALKPHOS 60 07/05/2022 1209   BILITOT 0.8 07/05/2022 1209   BILITOT 0.3 09/28/2021 0822   GFRNONAA 17 (L)  08/06/2022 0449   GFRNONAA 40 (L) 08/20/2019 0719   GFRAA 51 (L) 03/26/2020 0927   GFRAA 47 (L) 08/20/2019 0719     Diabetic Labs (most recent): Lab Results  Component Value Date   HGBA1C 9.1 (A) 10/26/2022   HGBA1C 9.5 (H) 07/27/2022   HGBA1C 9.5 (A) 07/26/2022   MICROALBUR 26.7 08/20/2019    Lipid Panel     Component Value Date/Time   CHOL 161 03/30/2021 0803   TRIG 212 (H) 03/30/2021 0803   HDL 35 (L) 03/30/2021 0803   CHOLHDL 4.6 (H) 03/30/2021 0803   CHOLHDL 3.6 01/16/2019 0937   VLDL UNABLE TO CALCULATE IF TRIGLYCERIDE OVER 400 mg/dL 16/12/9602 5409   LDLCALC 90 03/30/2021 0803   LDLCALC 69 01/16/2019 0937   LABVLDL 36 03/30/2021 0803       Assessment & Plan:   1) DM type 2 causing vascular disease (HCC)  - NAZIFA GONSER has currently uncontrolled symptomatic type 2 DM since  67 years of age.  She presents today with her meter and logs showing above target glycemic profile overall.  Her POCT A1c today is 9.1%, improving slightly from last visit of 9.5%.  She did stop taking her [place] after she had her hernia repair surgery as she was not eating much and didn't think she needed it.    -Recent labs reviewed showing worsening kidney function- will refer to nephrology for further evaluation and treatment.  - I had a long discussion with her about the progressive nature of diabetes and the pathology behind its complications. -her diabetes is complicated by coronary artery disease, CVA, nephropathy, peripheral neuropathy, chronic heavy smoking, sedentary life and she remains at a high risk for more acute and chronic complications which include CAD, CVA, CKD, retinopathy, and neuropathy. These are all discussed in detail with her.  - Nutritional counseling repeated at each appointment due to patients tendency to fall back in to old habits.  - The patient admits there is a room for improvement in their diet and drink choices. -  Suggestion is made for the patient to  avoid simple carbohydrates from their diet including Cakes, Sweet Desserts / Pastries, Ice Cream, Soda (diet and regular), Sweet Tea, Candies, Chips, Cookies, Sweet Pastries, Store Bought Juices, Alcohol in Excess of 1-2 drinks a day, Artificial Sweeteners, Coffee Creamer, and "Sugar-free" Products. This will help patient to have stable blood glucose profile and potentially avoid unintended weight gain.   - I encouraged the patient to switch to unprocessed or minimally processed complex starch and increased protein intake (animal or plant source), fruits, and vegetables.   - Patient is advised to stick to a routine mealtimes to eat 3 meals a day and avoid unnecessary snacks (to snack only to correct hypoglycemia).  - I have approached her with the following individualized plan to manage  her diabetes and patient agrees:   -Given her recent increase in numbers, will restart her on insulin (safest treatment for kidneys) with Tresiba 10 units SQ nightly.    She is advised to restart monitoring glucose twice daily, before breakfast and before bed, and  to call the clinic if she has readings less than 70 or above 200 for 3 tests in a row.  She is not a candidate for Metformin due to kidney disease, she does not meet criteria for GLP1 use with BMI less than 25.  She was on Glipizide in the past but had severe hypoglycemia.  - Specific targets for  A1c;  LDL, HDL, Triglycerides, and  Waist Circumference were discussed with the patient.  2) Blood Pressure /Hypertension:  Her blood pressure is tight, recently taken off all BP meds.    3) Lipids/Hyperlipidemia:   Her most recent lipid panel from 03/30/21 shows controlled LDL of 90 and elevated triglycerides of 212.  She is advised to continue Crestor 20 mg po daily at bedtime.  Side effects and precautions discussed with her.  She is advised to avoid fried foods and butter.    4)  Weight/Diet:  Her Body mass index is 21.24 kg/m..  she is not a candidate  for more weight loss.   Exercise, and detailed carbohydrates information provided  -  detailed on discharge instructions.  5) Chronic Care/Health Maintenance: -she is on ACEI/ARB and Statin medications and is encouraged to initiate and continue to follow up with Ophthalmology, Dentist, Podiatrist at least yearly or according to recommendations, and advised to quit smoking. I have recommended yearly flu vaccine and pneumonia vaccine at least every 5 years; moderate intensity exercise for up to 150 minutes weekly; and sleep for at least 7 hours a day.  - she is advised to maintain close follow up with Elfredia Nevins, MD for primary care needs (will be calling him about her fatigue to have iron checked), as well as her other providers for optimal and coordinated care (also reaching out to surgeon about the mass that has appeared.        I spent  29  minutes in the care of the patient today including review of labs from CMP, Lipids, Thyroid Function, Hematology (current and previous including abstractions from other facilities); face-to-face time discussing  her blood glucose readings/logs, discussing hypoglycemia and hyperglycemia episodes and symptoms, medications doses, her options of short and long term treatment based on the latest standards of care / guidelines;  discussion about incorporating lifestyle medicine;  and documenting the encounter. Risk reduction counseling performed per USPSTF guidelines to reduce obesity and cardiovascular risk factors.     Please refer to Patient Instructions for Blood Glucose Monitoring and Insulin/Medications Dosing Guide"  in media tab for additional information. Please  also refer to " Patient Self Inventory" in the Media  tab for reviewed elements of pertinent patient history.  Boykin Nearing participated in the discussions, expressed understanding, and voiced agreement with the above plans.  All questions were answered to her satisfaction. she is encouraged  to contact clinic should she have any questions or concerns prior to her return visit.   Follow up plan: - Return in about 3 months (around 01/26/2023) for Diabetes F/U with A1c in office, No previsit labs, Bring meter and logs.  Ronny Bacon, [facility] [address] [place], [place] [address] Phone: [phone] Fax: [phone]  10/26/2022, 12:04 PM

## 2022-10-27 ENCOUNTER — Other Ambulatory Visit: Payer: Self-pay

## 2022-10-27 MED ORDER — DEXCOM G7 SENSOR MISC: 3 refills | Status: DC

## 2022-10-27 MED ORDER — DEXCOM G7 RECEIVER DEVI: 0 refills | Status: DC

## 2022-11-02 ENCOUNTER — Ambulatory Visit (INDEPENDENT_AMBULATORY_CARE_PROVIDER_SITE_OTHER): Payer: PPO | Admitting: Gastroenterology

## 2022-11-02 ENCOUNTER — Encounter (INDEPENDENT_AMBULATORY_CARE_PROVIDER_SITE_OTHER): Payer: Self-pay | Admitting: Gastroenterology

## 2022-11-02 VITALS — BP 107/57 | HR 80 | Temp 97.5°F | Ht 64.0 in | Wt 136.3 lb

## 2022-11-02 DIAGNOSIS — R197 Diarrhea, unspecified: Secondary | ICD-10-CM | POA: Insufficient documentation

## 2022-11-02 DIAGNOSIS — K219 Gastro-esophageal reflux disease without esophagitis: Secondary | ICD-10-CM

## 2022-11-02 MED ORDER — PANTOPRAZOLE SODIUM 40 MG PO TBEC: 40.0000 mg | DELAYED_RELEASE_TABLET | Freq: Every day | ORAL | 3 refills | Status: DC

## 2022-11-02 NOTE — Progress Notes (Addendum)
Referring Provider: Elfredia Nevins, MD Primary Care Physician:  Elfredia Nevins, MD Primary GI Physician: Dr. Levon Hedger   Chief Complaint  Patient presents with   Gastroesophageal Reflux    Patient here today due to GERD. Patient says the pantoprazole 40 mg does control her symptoms.    HPI:   Grace Hess is a 67 y.o. female with past medical history of anxiety, coronary artery disease status post stent placement, GERD, hypertension, hyperlipidemia, obesity, stroke, diabetes   Patient presenting today for follow up of GERD and to schedule colonoscopy   Last seen January 2024, at that time noted a mass like lesion in perianal area that was painful, also with some blood in underwear. Occasional nausea.   Referred to ED for possible perianal abscess, call to schedule colonoscopy thereafter.   Seen in ED on 1/16, started on cipro, flagyl BID x7 days.   Present:  States that perianal abscess healed up with antibiotics in january. 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 Has has not scheduled screening colonoscopy as she has had a lot going on recently.   She started having diarrhea about a week ago, taking imodium. Denies black stools or BRB. Denies any abdominal pain. No recent antibiotics since January, no other medication changes. She typically has one BM every other day at baseline. Noting stools are now loose to watery.   GERD well controlled on protonix 40mg  daily, doing well on this without breakthrough. No dysphagia, odynophagia.   Last Colonoscopy: 2010 single diverticulum in sigmoid   Recommendations:    Past Medical History:  Diagnosis Date   Anxiety    CAD (coronary artery disease)    a. s/p DES to RCA in 07/2016 with residual mid-LAD stenosis   Cancer (HCC)    skin cancer on finger,, cancer left kidney   Cervicalgia    Chest pain, unspecified    Chronic kidney disease    Depression    GERD  (gastroesophageal reflux disease)    Headache    "bad ones after my stroke in 2016; only have them when I get bad news now" (08/02/2016)   Heart murmur    Heavy smoker (more than 20 cigarettes per day)    Scheduled for LDCT screening 02/11/15   History of hiatal hernia    History of kidney stones    "no cysto/OR" (12/10/2014)   Hypercholesteremia    Hypertension    Obesity    Palpitations    Reflux esophagitis    Stroke [facility])    "they said I had a  MINI stroke a few months back/MRI" (12/10/2014)   TIA (transient ischemic attack) 11/28/2014   Grace Hess 11/28/2014   Type II diabetes mellitus (HCC)     Past Surgical History:  Procedure Laterality Date   ABDOMINAL AORTOGRAM N/A 08/02/2016   Procedure: Abdominal Aortogram;  Surgeon: Yvonne Kendall, MD;  Location: [facility];  Service: Cardiovascular;  Laterality: N/A;   CAROTID STENT     CHOLECYSTECTOMY OPEN  1978   COLONOSCOPY  2010   single diverticulum in sigmoid colon   CORONARY ANGIOPLASTY WITH STENT PLACEMENT  08/02/2016   to RCA   CORONARY STENT INTERVENTION N/A 08/02/2016   Procedure: Coronary Stent Intervention;  Surgeon: Yvonne Kendall, MD;  Location: [facility];  Service: Cardiovascular;  Laterality: N/A;  RCA   CYSTOSCOPY W/ RETROGRADES Bilateral 07/15/2021   Procedure: CYSTOSCOPY WITH RETROGRADE PYELOGRAM;  Surgeon: Pete Glatter,  Danford Bad., MD;  Location: [facility];  Service: Urology;  Laterality: Bilateral;   CYSTOSCOPY WITH STENT PLACEMENT Left 07/15/2021   Procedure: CYSTOSCOPY WITH STENT PLACEMENT;  Surgeon: Milderd Meager., MD;  Location: [facility];  Service: Urology;  Laterality: Left;   ENDARTERECTOMY Left 12/02/2014   Procedure: ENDARTERECTOMY CAROTID;  Surgeon: Larina Earthly, MD;  Location: [facility] OR;  Service: Vascular;  Laterality: Left;   HERNIA REPAIR     LAPAROSCOPIC NEPHRECTOMY Left    LAPAROSCOPIC NEPHRECTOMY Left 11/25/2021   Procedure: LEFT LAPAROSCOPIC RADICAL NEPHRECTOMY;  Surgeon: Crist Fat, MD;  Location: [facility];  Service: Urology;  Laterality: Left;   LEFT HEART CATH AND CORONARY ANGIOGRAPHY N/A 08/02/2016   Procedure: Left Heart Cath and Coronary Angiography;  Surgeon: Yvonne Kendall, MD;  Location: [facility];  Service: Cardiovascular;  Laterality: N/A;   LOWER EXTREMITY ANGIOGRAM  08/02/2016   LOWER EXTREMITY ANGIOGRAPHY N/A 08/02/2016   Procedure: Lower Extremity Angiography;  Surgeon: Yvonne Kendall, MD;  Location: [facility];  Service: Cardiovascular;  Laterality: N/A;   NEPHRECTOMY Left    VAGINAL HYSTERECTOMY  1991   "partial"   VENTRAL HERNIA REPAIR N/A 08/05/2022   Procedure: LAPAROSCOPIC VENTRAL WALL HERNIA REPAIR WITH MESH;  Surgeon: Karie Soda, MD;  Location: [facility];  Service: General;  Laterality: N/A;    Current Outpatient Medications  Medication Sig Dispense Refill   acetaminophen (TYLENOL) 325 MG tablet Take 2 tablets (650 mg total) by mouth every 4 (four) hours as needed for headache or mild pain.     albuterol (VENTOLIN HFA) 108 (90 Base) MCG/ACT inhaler Inhale 1-2 puffs into the lungs every 4 (four) hours as needed for shortness of breath or wheezing.     ALPRAZolam (XANAX) 0.5 MG tablet Take 0.25 mg by mouth 2 (two) times daily.     Biotin 5000 MCG CAPS Take 5,000 mcg by mouth daily.     Blood Glucose Monitoring Suppl (ACCU-CHEK GUIDE) w/Device KIT 1 Piece by Does not apply route as directed. 1 kit 0   Budeson-Glycopyrrol-Formoterol (BREZTRI AEROSPHERE) 160-9-4.8 MCG/ACT AERO Inhale into the lungs. 1-2 times per day     calcitRIOL (ROCALTROL) 0.25 MCG capsule Take 0.25 mcg by mouth every Monday, Wednesday, and Friday.     calcium acetate (PHOSLO) 667 MG tablet Take 667 mg by mouth daily.     Cholecalciferol (VITAMIN D3) 125 MCG (5000 UT) CAPS Take 5,000 Units by mouth once a week. Tuesday     clopidogrel (PLAVIX) 75 MG tablet Take 1 tablet (75 mg total) by mouth daily. 30 tablet 3   escitalopram (LEXAPRO) 20 MG tablet  Take 20 mg by mouth daily.     gabapentin (NEURONTIN) 300 MG capsule Take 300 mg by mouth at bedtime.     glucose blood (ACCU-CHEK GUIDE) test strip Use as instructed 150 each 2   insulin degludec (TRESIBA FLEXTOUCH) 100 UNIT/ML FlexTouch Pen Inject 10 Units into the skin at bedtime.     Insulin Pen Needle (PEN NEEDLES) 31G X 6 MM MISC Use to inject insulin once daily 100 each 3   nystatin cream (MYCOSTATIN) Apply 1 application  topically 2 (two) times daily as needed (Itching).  5   ondansetron (ZOFRAN) 4 MG tablet TAKE 1 TABLET(4 MG) BY MOUTH TWICE DAILY AS NEEDED FOR NAUSEA OR VOMITING (Patient taking differently: Take 4 mg by mouth every 8 (eight) hours as needed for nausea.) 20 tablet 0   oxyCODONE-acetaminophen (PERCOCET/ROXICET) 5-325 MG tablet  Take 1 tablet by mouth every 6 (six) hours as needed for severe pain or moderate pain. 30 tablet 0   pantoprazole (PROTONIX) 40 MG tablet TAKE 1 TABLET(40 MG) BY MOUTH DAILY 30 tablet 2   promethazine (PHENERGAN) 25 MG tablet Take 25 mg by mouth 4 (four) times daily as needed.     rosuvastatin (CRESTOR) 20 MG tablet TAKE 1 TABLET(20 MG) BY MOUTH DAILY 90 tablet 1   sodium bicarbonate 650 MG tablet Take 650 mg by mouth 2 (two) times daily.     Continuous Glucose Receiver (DEXCOM G7 RECEIVER) DEVI Use to test BG. E11.65 (Patient not taking: Reported on 11/02/2022) 1 each 0   Continuous Glucose Sensor (DEXCOM G7 SENSOR) MISC Change sensor every 10 days as directed. E11.65 (Patient not taking: Reported on 11/02/2022) 9 each 3   LOKELMA 10 g PACK packet Take 10 g by mouth 3 (three) times a week. (Patient not taking: Reported on 11/02/2022)     No current facility-administered medications for this visit.    Allergies as of 11/02/2022 - Review Complete 11/02/2022  Allergen Reaction Noted   Codeine Shortness Of Breath 03/18/2011   Latex Itching 12/10/2014   Sulfa antibiotics Other (See Comments) 03/18/2011    Family History  Problem Relation Age of  Onset   Congestive Heart Failure Mother    COPD Father    Cancer Brother    Cancer Brother    Breast cancer Neg Hx     Social History   Socioeconomic History   Marital status: Married    Spouse name: Not on file   Number of children: 2   Years of education: 12   Highest education level: Not on file  Occupational History   Not on file  Tobacco Use   Smoking status: Every Day    Current packs/day: 1.50    Average packs/day: 1.5 packs/day for 48.5 years (72.7 ttl pk-yrs)    Types: Cigarettes    Start date: 05/17/1974   Smokeless tobacco: Never  Vaping Use   Vaping status: Never Used  Substance and Sexual Activity   Alcohol use: No    Alcohol/week: 0.0 standard drinks of alcohol   Drug use: No   Sexual activity: Not Currently  Other Topics Concern   Not on file  Social History Narrative   Patient does not drink caffeine.   Patient is right handed.    Social Determinants of Health   Financial Resource Strain: Low Risk  (05/18/2022)   Received from [facility], [facility]   Overall Financial Resource Strain (CARDIA)    Difficulty of Paying Living Expenses: Not hard at all  Food Insecurity: No Food Insecurity (05/18/2022)   Received from [facility], [facility]   Hunger Vital Sign    Worried About Running Out of Food in the Last Year: Never true    Ran Out of Food in the Last Year: Never true  Transportation Needs: No Transportation Needs (05/18/2022)   Received from [facility], [facility]   PRAPARE - Transportation    Lack of Transportation (Medical): No    Lack of Transportation (Non-Medical): No  Physical Activity: Not on file  Stress: Not on file  Social Connections: Unknown (04/08/2022)   Received from [facility], [facility]   Social Network    Social Network: Not on file    Review of systems General: negative for malaise, night sweats, fever, chills, weight loss Neck: Negative for lumps, goiter, pain and significant neck swelling Resp:  Negative for cough, wheezing, dyspnea at rest CV: Negative for chest pain, leg swelling, palpitations, orthopnea GI: denies melena, hematochezia, nausea, vomiting, constipation, dysphagia, odyonophagia, early satiety or unintentional weight loss. +diarrhea  MSK: Negative for joint pain or swelling, back pain, and muscle pain. Derm: Negative for itching or rash Psych: Denies depression, anxiety, memory loss, confusion. No homicidal or suicidal ideation.  Heme: Negative for prolonged bleeding, bruising easily, and swollen nodes. Endocrine: Negative for cold or heat intolerance, polyuria, polydipsia and goiter. Neuro: negative for tremor, gait imbalance, syncope and seizures. The remainder of the review of systems is noncontributory.  Physical Exam: BP (!) 107/57 (BP Location: Left Arm, Patient Position: Sitting, Cuff Size: Normal)   Pulse 80   Temp (!) 97.5 F (36.4 C) (Temporal)   Ht 5\' 4"  (1.626 m)   Wt 136 lb 4.8 oz (61.8 kg)   BMI 23.40 kg/m  General:   Alert and oriented. No distress noted. Pleasant and cooperative.  Head:  Normocephalic and atraumatic. Eyes:  Conjuctiva clear without scleral icterus. Mouth:  Oral mucosa pink and moist. Good dentition. No lesions. Heart: Normal rate and rhythm, s1 and s2 heart sounds present.  Lungs: Clear lung sounds in all lobes. Respirations equal and unlabored. Abdomen:  +BS, soft, non-tender and non-distended. No rebound or guarding. No HSM or masses noted. Derm: No palmar erythema or jaundice Msk:  Symmetrical without gross deformities. Normal posture. Extremities:  Without edema. Neurologic:  Alert and  oriented x4 Psych:  Alert and cooperative. Normal mood and affect.  Invalid input(s): "6 MONTHS"   ASSESSMENT: SHAILYNN BERNINGER is a 67 y.o. female presenting today for GERD and diarrhea  GERD: well controlled on protonix 40mg  daily. Denies breakthrough, dysphagia, odynophagia. Will continue with current PPI regimen.   Diarrhea: new  onset 1-1.5 weeks ago. No associated abdominal pain, rectal bleeding or melena. No recent antibiotics except in January and no recent medication changes. Etiology of diarrhea unclear. Will check C diff and GI pathogen panel to rule out infectious etiology, as well as CMP and TSH to rule out electrolyte or thyroid alterations that could be contributing.   Need for screening colonoscopy: last was in 2010, she had been recommended previously to schedule updated screening though has had some other health issues going on, she is amenable to getting this scheduled but would like to wait until early next year. Will place on recall list to call In December/January to schedule.    PLAN:  Continue with protonix 40mg  daily  2. Schedule colonoscopy ( call pt in December/January)  3. Gi pathogen panel, C diff, TSH, CMP   All questions were answered, patient verbalized understanding and is in agreement with plan as outlined above.   Follow Up: 6 months  Grace Kerschner L. Jeanmarie Hubert, MSN, APRN, AGNP-C Adult-Gerontology Nurse Practitioner [facility]  I have reviewed the note and agree with the APP's assessment as described in this progress note  Katrinka Blazing, MD Gastroenterology and Hepatology [facility]astro[facility]

## 2022-11-02 NOTE — Patient Instructions (Signed)
We will check some stool studies and a few labs in regards to your diarrhea Please continue with pantoprazole 40mg  daily, I have sent a refill We will call you at the end of this year/beginning of next year to schedule colonoscopy as you would like to hold off on doing this right now Please let me know if you have new or worsening symptoms  Follow up 6 months  It was a pleasure to see you today. I want to create trusting relationships with patients and provide genuine, compassionate, and quality care. I truly value your feedback! please be on the lookout for a survey regarding your visit with me today. I appreciate your input about our visit and your time in completing this!    Ieisha Gao L. Jeanmarie Hubert, MSN, APRN, AGNP-C Adult-Gerontology Nurse Practitioner [facility] at [facility]

## 2022-11-30 DIAGNOSIS — N184 Chronic kidney disease, stage 4 (severe): Secondary | ICD-10-CM | POA: Diagnosis not present

## 2022-11-30 DIAGNOSIS — D649 Anemia, unspecified: Secondary | ICD-10-CM | POA: Diagnosis not present

## 2022-11-30 DIAGNOSIS — I129 Hypertensive chronic kidney disease with stage 1 through stage 4 chronic kidney disease, or unspecified chronic kidney disease: Secondary | ICD-10-CM | POA: Diagnosis not present

## 2022-11-30 DIAGNOSIS — N189 Chronic kidney disease, unspecified: Secondary | ICD-10-CM | POA: Diagnosis not present

## 2022-11-30 DIAGNOSIS — E1122 Type 2 diabetes mellitus with diabetic chronic kidney disease: Secondary | ICD-10-CM | POA: Diagnosis not present

## 2022-11-30 DIAGNOSIS — E875 Hyperkalemia: Secondary | ICD-10-CM | POA: Diagnosis not present

## 2022-11-30 DIAGNOSIS — N2581 Secondary hyperparathyroidism of renal origin: Secondary | ICD-10-CM | POA: Diagnosis not present

## 2022-12-03 DIAGNOSIS — N184 Chronic kidney disease, stage 4 (severe): Secondary | ICD-10-CM | POA: Diagnosis not present

## 2022-12-03 DIAGNOSIS — N2581 Secondary hyperparathyroidism of renal origin: Secondary | ICD-10-CM | POA: Diagnosis not present

## 2022-12-03 DIAGNOSIS — E1122 Type 2 diabetes mellitus with diabetic chronic kidney disease: Secondary | ICD-10-CM | POA: Diagnosis not present

## 2022-12-08 DIAGNOSIS — N184 Chronic kidney disease, stage 4 (severe): Secondary | ICD-10-CM | POA: Diagnosis not present

## 2022-12-08 DIAGNOSIS — M1991 Primary osteoarthritis, unspecified site: Secondary | ICD-10-CM | POA: Diagnosis not present

## 2022-12-08 DIAGNOSIS — I10 Essential (primary) hypertension: Secondary | ICD-10-CM | POA: Diagnosis not present

## 2022-12-08 DIAGNOSIS — K219 Gastro-esophageal reflux disease without esophagitis: Secondary | ICD-10-CM | POA: Diagnosis not present

## 2022-12-08 DIAGNOSIS — E875 Hyperkalemia: Secondary | ICD-10-CM | POA: Diagnosis not present

## 2022-12-08 DIAGNOSIS — E1165 Type 2 diabetes mellitus with hyperglycemia: Secondary | ICD-10-CM | POA: Diagnosis not present

## 2022-12-08 DIAGNOSIS — Z6824 Body mass index (BMI) 24.0-24.9, adult: Secondary | ICD-10-CM | POA: Diagnosis not present

## 2022-12-08 DIAGNOSIS — I739 Peripheral vascular disease, unspecified: Secondary | ICD-10-CM | POA: Diagnosis not present

## 2022-12-19 ENCOUNTER — Other Ambulatory Visit: Payer: Self-pay | Admitting: Cardiology

## 2023-01-10 ENCOUNTER — Ambulatory Visit (HOSPITAL_COMMUNITY)
Admission: RE | Admit: 2023-01-10 | Discharge: 2023-01-10 | Disposition: A | Discharge status: Home / Self Care | Payer: PPO | Source: Ambulatory Visit | Attending: Hematology | Admitting: Hematology

## 2023-01-10 ENCOUNTER — Other Ambulatory Visit: Payer: PPO

## 2023-01-10 ENCOUNTER — Inpatient Hospital Stay: Payer: PPO | Attending: Hematology

## 2023-01-10 DIAGNOSIS — D649 Anemia, unspecified: Secondary | ICD-10-CM | POA: Insufficient documentation

## 2023-01-10 DIAGNOSIS — E875 Hyperkalemia: Secondary | ICD-10-CM | POA: Diagnosis not present

## 2023-01-10 DIAGNOSIS — C642 Malignant neoplasm of left kidney, except renal pelvis: Secondary | ICD-10-CM | POA: Insufficient documentation

## 2023-01-10 DIAGNOSIS — I700 Atherosclerosis of aorta: Secondary | ICD-10-CM | POA: Diagnosis not present

## 2023-01-10 DIAGNOSIS — E1122 Type 2 diabetes mellitus with diabetic chronic kidney disease: Secondary | ICD-10-CM | POA: Diagnosis not present

## 2023-01-10 DIAGNOSIS — N2581 Secondary hyperparathyroidism of renal origin: Secondary | ICD-10-CM | POA: Diagnosis not present

## 2023-01-10 DIAGNOSIS — Z905 Acquired absence of kidney: Secondary | ICD-10-CM | POA: Diagnosis not present

## 2023-01-10 DIAGNOSIS — C649 Malignant neoplasm of unspecified kidney, except renal pelvis: Secondary | ICD-10-CM | POA: Diagnosis not present

## 2023-01-10 DIAGNOSIS — I129 Hypertensive chronic kidney disease with stage 1 through stage 4 chronic kidney disease, or unspecified chronic kidney disease: Secondary | ICD-10-CM | POA: Diagnosis not present

## 2023-01-10 DIAGNOSIS — N189 Chronic kidney disease, unspecified: Secondary | ICD-10-CM | POA: Diagnosis not present

## 2023-01-10 DIAGNOSIS — E8722 Chronic metabolic acidosis: Secondary | ICD-10-CM | POA: Diagnosis not present

## 2023-01-10 DIAGNOSIS — N184 Chronic kidney disease, stage 4 (severe): Secondary | ICD-10-CM | POA: Diagnosis not present

## 2023-01-10 LAB — CBC WITH DIFFERENTIAL/PLATELET
Abs Immature Granulocytes: 0.06 10*3/uL (ref 0.00–0.07)
Basophils Absolute: 0.1 10*3/uL (ref 0.0–0.1)
Basophils Relative: 1 %
Eosinophils Absolute: 0.3 10*3/uL (ref 0.0–0.5)
Eosinophils Relative: 3 %
HCT: 32.7 % — ABNORMAL LOW (ref 36.0–46.0)
Hemoglobin: 10.7 g/dL — ABNORMAL LOW (ref 12.0–15.0)
Immature Granulocytes: 1 %
Lymphocytes Relative: 19 %
Lymphs Abs: 1.8 10*3/uL (ref 0.7–4.0)
MCH: 30.6 pg (ref 26.0–34.0)
MCHC: 32.7 g/dL (ref 30.0–36.0)
MCV: 93.4 fL (ref 80.0–100.0)
Monocytes Absolute: 0.8 10*3/uL (ref 0.1–1.0)
Monocytes Relative: 8 %
Neutro Abs: 6.6 10*3/uL (ref 1.7–7.7)
Neutrophils Relative %: 68 %
Platelets: 223 10*3/uL (ref 150–400)
RBC: 3.5 MIL/uL — ABNORMAL LOW (ref 3.87–5.11)
RDW: 14.9 % (ref 11.5–15.5)
WBC: 9.6 10*3/uL (ref 4.0–10.5)
nRBC: 0 % (ref 0.0–0.2)

## 2023-01-10 LAB — COMPREHENSIVE METABOLIC PANEL
ALT: 11 U/L (ref 0–44)
AST: 13 U/L — ABNORMAL LOW (ref 15–41)
Albumin: 3.7 g/dL (ref 3.5–5.0)
Alkaline Phosphatase: 56 U/L (ref 38–126)
Anion gap: 8 (ref 5–15)
BUN: 63 mg/dL — ABNORMAL HIGH (ref 8–23)
CO2: 22 mmol/L (ref 22–32)
Calcium: 8.7 mg/dL — ABNORMAL LOW (ref 8.9–10.3)
Chloride: 104 mmol/L (ref 98–111)
Creatinine, Ser: 3.2 mg/dL — ABNORMAL HIGH (ref 0.44–1.00)
GFR, Estimated: 15 mL/min — ABNORMAL LOW (ref >60)
Glucose, Bld: 260 mg/dL — ABNORMAL HIGH (ref 70–99)
Potassium: 4.8 mmol/L (ref 3.5–5.1)
Sodium: 134 mmol/L — ABNORMAL LOW (ref 135–145)
Total Bilirubin: 0.2 mg/dL — ABNORMAL LOW (ref 0.3–1.2)
Total Protein: 6.8 g/dL (ref 6.5–8.1)

## 2023-01-10 LAB — IRON AND TIBC
Iron: 68 ug/dL (ref 28–170)
Saturation Ratios: 23 % (ref 10.4–31.8)
TIBC: 297 ug/dL (ref 250–450)
UIBC: 229 ug/dL

## 2023-01-10 LAB — FERRITIN: Ferritin: 59 ng/mL (ref 11–307)

## 2023-01-14 DIAGNOSIS — N184 Chronic kidney disease, stage 4 (severe): Secondary | ICD-10-CM | POA: Diagnosis not present

## 2023-01-14 DIAGNOSIS — E1122 Type 2 diabetes mellitus with diabetic chronic kidney disease: Secondary | ICD-10-CM | POA: Diagnosis not present

## 2023-01-14 DIAGNOSIS — I129 Hypertensive chronic kidney disease with stage 1 through stage 4 chronic kidney disease, or unspecified chronic kidney disease: Secondary | ICD-10-CM | POA: Diagnosis not present

## 2023-01-14 DIAGNOSIS — N2581 Secondary hyperparathyroidism of renal origin: Secondary | ICD-10-CM | POA: Diagnosis not present

## 2023-01-18 ENCOUNTER — Inpatient Hospital Stay: Payer: PPO | Attending: Hematology | Admitting: Hematology

## 2023-01-18 ENCOUNTER — Other Ambulatory Visit: Payer: Self-pay

## 2023-01-18 ENCOUNTER — Encounter: Payer: Self-pay | Admitting: Hematology

## 2023-01-18 VITALS — BP 125/57 | HR 67 | Temp 98.4°F | Resp 18 | Wt 142.0 lb

## 2023-01-18 DIAGNOSIS — D509 Iron deficiency anemia, unspecified: Secondary | ICD-10-CM | POA: Insufficient documentation

## 2023-01-18 DIAGNOSIS — C642 Malignant neoplasm of left kidney, except renal pelvis: Secondary | ICD-10-CM

## 2023-01-18 DIAGNOSIS — N132 Hydronephrosis with renal and ureteral calculous obstruction: Secondary | ICD-10-CM | POA: Diagnosis not present

## 2023-01-18 DIAGNOSIS — I129 Hypertensive chronic kidney disease with stage 1 through stage 4 chronic kidney disease, or unspecified chronic kidney disease: Secondary | ICD-10-CM | POA: Diagnosis not present

## 2023-01-18 NOTE — Patient Instructions (Addendum)
[facility] at [facility] Discharge Instructions   You were seen and examined today by Dr. Ellin Saba.  He reviewed the results of your lab work. Your iron is low. Dr. Kirtland Bouchard recommends giving you IV iron. We will arrange for you to have this in the clinic.   He reviewed the results of your CT scan which is normal/stable.   We will see you back in 6 months. We will repeat lab work prior to this visit.    Thank you for choosing [facility] at [facility] to provide your oncology and hematology care.  To afford each patient quality time with our provider, please arrive at least 15 minutes before your scheduled appointment time.   If you have a lab appointment with the Cancer Center please come in thru the Main Entrance and check in at the main information desk.  You need to re-schedule your appointment should you arrive 10 or more minutes late.  We strive to give you quality time with our providers, and arriving late affects you and other patients whose appointments are after yours.  Also, if you no show three or more times for appointments you may be dismissed from the clinic at the providers discretion.     Again, thank you for choosing [facility].  Our hope is that these requests will decrease the amount of time that you wait before being seen by our physicians.       _____________________________________________________________  Should you have questions after your visit to [facility], please contact our office at [phone] and follow the prompts.  Our office hours are 8:00 a.m. and 4:30 p.m. Monday - Friday.  Please note that voicemails left after 4:00 p.m. may not be returned until the following business day.  We are closed weekends and major holidays.  You do have access to a nurse 24-7, just call the main number to the clinic [phone] and do not press any options, hold on the line and a nurse will answer the  phone.    For prescription refill requests, have your pharmacy contact our office and allow 72 hours.    Due to Covid, you will need to wear a mask upon entering the hospital. If you do not have a mask, a mask will be given to you at the Main Entrance upon arrival. For doctor visits, patients may have 1 support person age 41 or older with them. For treatment visits, patients can not have anyone with them due to social distancing guidelines and our immunocompromised population.

## 2023-01-18 NOTE — Progress Notes (Signed)
[facility] [address], [place] [address]    Clinic Day:  01/18/2023  Referring physician: Elfredia Nevins, MD  Patient Care Team: Elfredia Nevins, MD as PCP - General (Internal Medicine) Wyline Mood Dorothe Pea, MD as PCP - Cardiology (Cardiology) Doreatha Massed, MD as Medical Oncologist (Medical Oncology) Crist Fat, MD as Attending Physician (Urology) Dani Gobble, NP as Nurse Practitioner (Endocrinology) Karie Soda, MD as Consulting Physician (General Surgery)   ASSESSMENT & PLAN:   Assessment: 1. 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  Moderate to marked severity left-sided hydronephrosis.  Numerous bilateral nonobstructing renal calculi. - MRI abdomen (08/14/2021): Central lower pole left kidney 4 x 3.7 x 5.3 cm enhancing mass, extension into the proximal left ureter.  No renal vein involvement or evidence of abdominal metastatic disease.  Multiple small abdominal retroperitoneal lymph nodes, nonpathologic by size criteria.  Right abdominal wall subcutaneous nodule nonspecific but new since 07/13/2021. - Left kidney biopsy (09/10/2021): Clear-cell renal cell carcinoma, nuclear grade 2.  No tumor necrosis. - Left nephrectomy on 11/25/2021: Pathology 3.2 cm clear-cell renal cell carcinoma, grade 4, negative sarcoid features, focal rhabdoid features, tumor necrosis negative.  PT1APN0.   2. Social/family history: - She lives at home with her husband.  She retired after working in [employer].  She is current active smoker, 2 packs/day for 48 years. - Brother had throat cancer and was smoker.  Maternal uncle died of cancer.  Paternal uncle also had cancer.  Maternal grandmother had lung cancer.    Plan: 1. Stage I (T1 aN0 M0)  left kidney clear-cell renal cell carcinoma, grade 4: - She denies any hematuria or new onset pains. - Reviewed labs from 01/10/2023: Normal LFTs.  Creatinine 3.20.  CBC was normal. - CT CAP without contrast on 01/10/2023: No signs of local recurrence or metastatic disease.  Stable small scattered lung nodules since 2016.  Nonobstructing right kidney stone. - RTC 6 months with repeat CTAP without IV contrast and labs.   2.  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 - She is taking iron tablet daily and is reasonably tolerating it well. - Her ferritin level decreased to 59 from 121.  Hemoglobin is stable at 10.7. - I have recommended parenteral iron therapy with Monoferric 1 g x 1.  We discussed side effects including rare chance of anaphylactic reaction. - She will decrease oral iron to 1 tablet Monday/Wednesday/Friday. - Will plan on repeating ferritin and iron panel at next visit in 6 months.      Orders Placed This Encounter  Procedures   CT ABDOMEN PELVIS W CONTRAST    Standing Status:   Future    Standing Expiration Date:   01/18/2024    Order Specific Question:   If indicated for the ordered procedure, I authorize the administration of contrast media per Radiology protocol    Answer:   Yes    Order Specific Question:   Does the patient have a contrast media/X-ray dye allergy?    Answer:   No    Order Specific Question:   Preferred imaging location?    Answer:   [facility]    Order Specific Question:   If indicated for the ordered procedure, I authorize the administration  of oral contrast media per Radiology protocol    Answer:   Yes   CBC with Differential    Standing Status:   Future    Standing Expiration Date:   01/18/2024   Comprehensive metabolic panel    Standing Status:   Future    Standing Expiration Date:   01/18/2024   Iron and TIBC ([facility] ONLY)    Standing Status:   Future    Standing Expiration  Date:   01/18/2024   Ferritin    Standing Status:   Future    Standing Expiration Date:   01/18/2024      Mikeal Hawthorne R Teague,acting as a scribe for Doreatha Massed, MD.,have documented all relevant documentation on the behalf of Doreatha Massed, MD,as directed by  Doreatha Massed, MD while in the presence of Doreatha Massed, MD.  I, Doreatha Massed MD, have reviewed the above documentation for accuracy and completeness, and I agree with the above.     Doreatha Massed, MD   11/5/20245:04 PM  CHIEF COMPLAINT:   Diagnosis: left kidney clear cell renal cell carcinoma    Cancer Staging  Renal cell cancer, left [facility]) Staging form: Kidney, AJCC 8th Edition - Clinical stage from 10/01/2021: Stage I (cT1a, cN0, cM0) - Signed by Doreatha Massed, MD on 12/22/2021    Prior Therapy: Left radical nephrectomy on 11/25/2021   Current Therapy:  Surveillance    HISTORY OF PRESENT ILLNESS:   Oncology History  Renal cell cancer, left (HCC)  10/01/2021 Initial Diagnosis   Renal cell cancer, left (HCC)   10/01/2021 Cancer Staging   Staging form: Kidney, AJCC 8th Edition - Clinical stage from 10/01/2021: Stage I (cT1a, cN0, cM0) - Signed by Doreatha Massed, MD on 12/22/2021 Histopathologic type: Clear cell adenocarcinoma, NOS Stage prefix: Initial diagnosis Histologic grade (G): G4 Histologic grading system: 4 grade system Histologic sub-type: Clear cell Sarcomatoid features: Absent Rhabdoid features: Present Tumor necrosis: Absent      INTERVAL HISTORY:   Grace Hess is a 67 y.o. female presenting to clinic today for follow up of left kidney clear cell renal cell carcinoma. She was last seen by me on 10/18/22.  Since her last visit, she underwent CT C/A/P on 01/10/23 that found: no signs of locally recurrent tumor within the left nephrectomy bed; no signs of metastatic disease within the chest, abdomen or pelvis; stable appearance of small scattered lung  nodules; nonobstructing right renal calculi; and coronary artery calcifications.  Today, she states that she is doing well overall. Her appetite level is at 75%. Her energy level is at 50%. She is accompanied by her husband. She denies any medical issues. She is taking iron tablets daily. She has never received IV iron.   PAST MEDICAL HISTORY:   Past Medical History: Past Medical History:  Diagnosis Date   Anxiety    CAD (coronary artery disease)    a. s/p DES to RCA in 07/2016 with residual mid-LAD stenosis   Cancer (HCC)    skin cancer on finger,, cancer left kidney   Cervicalgia    Chest pain, unspecified    Chronic kidney disease    Depression    GERD (gastroesophageal reflux disease)    Headache    "bad ones after my stroke in 2016; only have them when I get bad news now" (08/02/2016)   Heart murmur    Heavy smoker (more than 20 cigarettes per day)    Scheduled for LDCT screening 02/11/15   History of hiatal hernia  History of kidney stones    "no cysto/OR" (12/10/2014)   Hypercholesteremia    Hypertension    Obesity    Palpitations    Reflux esophagitis    Stroke [facility])    "they said I had a  MINI stroke a few months back/MRI" (12/10/2014)   TIA (transient ischemic attack) 11/28/2014   [name] 11/28/2014   Type II diabetes mellitus (HCC)     Surgical History: Past Surgical History:  Procedure Laterality Date   ABDOMINAL AORTOGRAM N/A 08/02/2016   Procedure: Abdominal Aortogram;  Surgeon: Yvonne Kendall, MD;  Location: [facility];  Service: Cardiovascular;  Laterality: N/A;   CAROTID STENT     CHOLECYSTECTOMY OPEN  1978   COLONOSCOPY  2010   single diverticulum in sigmoid colon   CORONARY ANGIOPLASTY WITH STENT PLACEMENT  08/02/2016   to RCA   CORONARY STENT INTERVENTION N/A 08/02/2016   Procedure: Coronary Stent Intervention;  Surgeon: Yvonne Kendall, MD;  Location: [facility];  Service: Cardiovascular;  Laterality: N/A;  RCA   CYSTOSCOPY W/  RETROGRADES Bilateral 07/15/2021   Procedure: CYSTOSCOPY WITH RETROGRADE PYELOGRAM;  Surgeon: Milderd Meager., MD;  Location: [facility];  Service: Urology;  Laterality: Bilateral;   CYSTOSCOPY WITH STENT PLACEMENT Left 07/15/2021   Procedure: CYSTOSCOPY WITH STENT PLACEMENT;  Surgeon: Milderd Meager., MD;  Location: [facility];  Service: Urology;  Laterality: Left;   ENDARTERECTOMY Left 12/02/2014   Procedure: ENDARTERECTOMY CAROTID;  Surgeon: Larina Earthly, MD;  Location: [facility] OR;  Service: Vascular;  Laterality: Left;   HERNIA REPAIR     LAPAROSCOPIC NEPHRECTOMY Left    LAPAROSCOPIC NEPHRECTOMY Left 11/25/2021   Procedure: LEFT LAPAROSCOPIC RADICAL NEPHRECTOMY;  Surgeon: Crist Fat, MD;  Location: [facility];  Service: Urology;  Laterality: Left;   LEFT HEART CATH AND CORONARY ANGIOGRAPHY N/A 08/02/2016   Procedure: Left Heart Cath and Coronary Angiography;  Surgeon: Yvonne Kendall, MD;  Location: [facility];  Service: Cardiovascular;  Laterality: N/A;   LOWER EXTREMITY ANGIOGRAM  08/02/2016   LOWER EXTREMITY ANGIOGRAPHY N/A 08/02/2016   Procedure: Lower Extremity Angiography;  Surgeon: Yvonne Kendall, MD;  Location: [facility];  Service: Cardiovascular;  Laterality: N/A;   NEPHRECTOMY Left    VAGINAL HYSTERECTOMY  1991   "partial"   VENTRAL HERNIA REPAIR N/A 08/05/2022   Procedure: LAPAROSCOPIC VENTRAL WALL HERNIA REPAIR WITH MESH;  Surgeon: Karie Soda, MD;  Location: [facility];  Service: General;  Laterality: N/A;    Social History: Social History   Socioeconomic History   Marital status: Married    Spouse name: Not on file   Number of children: 2   Years of education: 12   Highest education level: Not on file  Occupational History   Not on file  Tobacco Use   Smoking status: Every Day    Current packs/day: 1.50    Average packs/day: 1.5 packs/day for 48.7 years (73.0 ttl pk-yrs)    Types: Cigarettes    Start date: 05/17/1974   Smokeless tobacco:  Never  Vaping Use   Vaping status: Never Used  Substance and Sexual Activity   Alcohol use: No    Alcohol/week: 0.0 standard drinks of alcohol   Drug use: No   Sexual activity: Not Currently  Other Topics Concern   Not on file  Social History Narrative   Patient does not drink caffeine.   Patient is right handed.    Social Determinants of Corporate investment banker  Strain: Low Risk  (05/18/2022)   Received from [facility], [facility]   Overall Financial Resource Strain (CARDIA)    Difficulty of Paying Living Expenses: Not hard at all  Food Insecurity: No Food Insecurity (05/18/2022)   Received [facility], [facility]   Hunger Vital Sign    Worried About Running Out of Food in the Last Year: Never true    Ran Out of Food in the Last Year: Never true  Transportation Needs: No Transportation Needs (05/18/2022)   Received from [facility], [facility]   [facility] - Transportation    Lack of Transportation (Medical): No    Lack of Transportation (Non-Medical): No  Physical Activity: Not on file  Stress: Not on file  Social Connections: Unknown (04/08/2022)   [facility] from [facility], [facility]   Social Network    Social Network: Not on file  Intimate Partner Violence: Unknown (04/08/2022)   Received from [facility], [facility]   HITS    Physically Hurt: Not on file    Insult or Talk Down To: Not on file    Threaten Physical Harm: Not on file    Scream or Curse: Not on file    Family History: Family History  Problem Relation Age of Onset   Congestive Heart Failure Mother    COPD Father    Cancer Brother    Cancer Brother    Breast cancer Neg Hx     Current Medications:  Current Outpatient Medications:    acetaminophen (TYLENOL) 325 MG tablet, Take 2 tablets (650 mg total) by mouth every 4 (four) hours as needed for headache or mild pain., Disp: , Rfl:    albuterol (VENTOLIN HFA) 108 (90 Base) MCG/ACT inhaler, Inhale 1-2 puffs into the lungs  every 4 (four) hours as needed for shortness of breath or wheezing., Disp: , Rfl:    ALPRAZolam (XANAX) 0.5 MG tablet, Take 0.25 mg by mouth 2 (two) times daily., Disp: , Rfl:    Biotin 5000 MCG CAPS, Take 5,000 mcg by mouth daily., Disp: , Rfl:    Blood Glucose Monitoring Suppl (ACCU-CHEK GUIDE) w/Device KIT, 1 Piece by Does not apply route as directed., Disp: 1 kit, Rfl: 0   Budeson-Glycopyrrol-Formoterol (BREZTRI AEROSPHERE) 160-9-4.8 MCG/ACT AERO, Inhale into the lungs. 1-2 times per day, Disp: , Rfl:    calcitRIOL (ROCALTROL) 0.25 MCG capsule, Take 0.25 mcg by mouth every Monday, Wednesday, and Friday., Disp: , Rfl:    calcium acetate (PHOSLO) 667 MG tablet, Take 667 mg by mouth daily., Disp: , Rfl:    Cholecalciferol (VITAMIN D3) 125 MCG (5000 UT) CAPS, Take 5,000 Units by mouth once a week. Tuesday, Disp: , Rfl:    clopidogrel (PLAVIX) 75 MG tablet, Take 1 tablet (75 mg total) by mouth daily., Disp: 30 tablet, Rfl: 3   Continuous Glucose Receiver (DEXCOM G7 RECEIVER) DEVI, Use to test BG. E11.65, Disp: 1 each, Rfl: 0   Continuous Glucose Sensor (DEXCOM G7 SENSOR) MISC, Change sensor every 10 days as directed. E11.65, Disp: 9 each, Rfl: 3   escitalopram (LEXAPRO) 20 MG tablet, Take 20 mg by mouth daily., Disp: , Rfl:    gabapentin (NEURONTIN) 300 MG capsule, Take 300 mg by mouth at bedtime., Disp: , Rfl:    glucose blood (ACCU-CHEK GUIDE) test strip, Use as instructed, Disp: 150 each, Rfl: 2   insulin degludec (TRESIBA FLEXTOUCH) 100 UNIT/ML FlexTouch Pen, Inject 10 Units into the skin at bedtime., Disp: , Rfl:  Insulin Pen Needle (PEN NEEDLES) 31G X 6 MM MISC, Use to inject insulin once daily, Disp: 100 each, Rfl: 3   LOKELMA 10 g PACK packet, Take 10 g by mouth 3 (three) times a week., Disp: , Rfl:    nystatin cream (MYCOSTATIN), Apply 1 application  topically 2 (two) times daily as needed (Itching)., Disp: , Rfl: 5   ondansetron (ZOFRAN) 4 MG tablet, TAKE 1 TABLET(4 MG) BY MOUTH TWICE  DAILY AS NEEDED FOR NAUSEA OR VOMITING (Patient taking differently: Take 4 mg by mouth every 8 (eight) hours as needed for nausea.), Disp: 20 tablet, Rfl: 0   oxyCODONE-acetaminophen (PERCOCET/ROXICET) 5-325 MG tablet, Take 1 tablet by mouth every 6 (six) hours as needed for severe pain or moderate pain., Disp: 30 tablet, Rfl: 0   pantoprazole (PROTONIX) 40 MG tablet, Take 1 tablet (40 mg total) by mouth daily., Disp: 90 tablet, Rfl: 3   promethazine (PHENERGAN) 25 MG tablet, Take 25 mg by mouth 4 (four) times daily as needed., Disp: , Rfl:    rosuvastatin (CRESTOR) 20 MG tablet, TAKE 1 TABLET(20 MG) BY MOUTH DAILY, Disp: 90 tablet, Rfl: 1   sodium bicarbonate 650 MG tablet, Take 650 mg by mouth 2 (two) times daily., Disp: , Rfl:    Allergies: Allergies  Allergen Reactions   Codeine Shortness Of Breath   Latex Itching   Sulfa Antibiotics Other (See Comments)    Reaction:  Unknown     REVIEW OF SYSTEMS:   Review of Systems  Constitutional:  Negative for chills, fatigue and fever.  HENT:   Negative for lump/mass, mouth sores, nosebleeds, sore throat and trouble swallowing.   Eyes:  Negative for eye problems.  Respiratory:  Negative for cough and shortness of breath.   Cardiovascular:  Negative for chest pain, leg swelling and palpitations.  Gastrointestinal:  Negative for abdominal pain, constipation, diarrhea, nausea and vomiting.  Genitourinary:  Negative for bladder incontinence, difficulty urinating, dysuria, frequency, hematuria and nocturia.   Musculoskeletal:  Negative for arthralgias, back pain, flank pain, myalgias and neck pain.  Skin:  Negative for itching and rash.  Neurological:  Negative for dizziness, headaches and numbness.  Hematological:  Does not bruise/bleed easily.  Psychiatric/Behavioral:  Negative for depression, sleep disturbance and suicidal ideas. The patient is not nervous/anxious.   All other systems reviewed and are negative.    VITALS:   Blood pressure  (!) 125/57, pulse 67, temperature 98.4 F (36.9 C), temperature source Oral, resp. rate 18, weight 142 lb (64.4 kg), SpO2 96%.  Wt Readings from Last 3 Encounters:  01/18/23 142 lb (64.4 kg)  11/02/22 136 lb 4.8 oz (61.8 kg)  10/26/22 135 lb 9.6 oz (61.5 kg)    Body mass index is 24.37 kg/m.  Performance status (ECOG): 1 - Symptomatic but completely ambulatory  PHYSICAL EXAM:   Physical Exam Vitals and nursing note reviewed. Exam conducted with a chaperone present.  Constitutional:      Appearance: Normal appearance.  Cardiovascular:     Rate and Rhythm: Normal rate and regular rhythm.     Pulses: Normal pulses.     Heart sounds: Normal heart sounds.  Pulmonary:     Effort: Pulmonary effort is normal.     Breath sounds: Normal breath sounds.  Abdominal:     Palpations: Abdomen is soft. There is no hepatomegaly, splenomegaly or mass.     Tenderness: There is no abdominal tenderness.  Musculoskeletal:     Right lower leg: No edema.  Left lower leg: No edema.  Lymphadenopathy:     Cervical: No cervical adenopathy.     Right cervical: No superficial, deep or posterior cervical adenopathy.    Left cervical: No superficial, deep or posterior cervical adenopathy.     Upper Body:     Right upper body: No supraclavicular or axillary adenopathy.     Left upper body: No supraclavicular or axillary adenopathy.  Neurological:     General: No focal deficit present.     Mental Status: She is alert and oriented to person, place, and time.  Psychiatric:        Mood and Affect: Mood normal.        Behavior: Behavior normal.     LABS:      Latest Ref Rng & Units 01/10/2023    3:05 PM 10/11/2022   11:04 AM 08/06/2022    4:49 AM  CBC  WBC 4.0 - 10.5 K/uL 9.6  12.2    Hemoglobin 12.0 - 15.0 g/dL 30.8  65.7  84.6   Hematocrit 36.0 - 46.0 % 32.7  32.6    Platelets 150 - 400 K/uL 223  310        Latest Ref Rng & Units 01/10/2023    3:05 PM 08/06/2022    4:49 AM 07/27/2022    10:57 AM  CMP  Glucose 70 - 99 mg/dL 962   952   BUN 8 - 23 mg/dL 63   59   Creatinine 8.41 - 1.00 mg/dL 3.24  4.01  0.27   Sodium 135 - 145 mmol/L 134   134   Potassium 3.5 - 5.1 mmol/L 4.8  5.2  5.0   Chloride 98 - 111 mmol/L 104   105   CO2 22 - 32 mmol/L 22   19   Calcium 8.9 - 10.3 mg/dL 8.7   8.5   Total Protein 6.5 - 8.1 g/dL 6.8     Total Bilirubin 0.3 - 1.2 mg/dL 0.2     Alkaline Phos 38 - 126 U/L 56     AST 15 - 41 U/L 13     ALT 0 - 44 U/L 11        No results found for: "CEA1", "CEA" / No results found for: "CEA1", "CEA" No results found for: "PSA1" No results found for: "OZD664" No results found for: "CAN125"  No results found for: "TOTALPROTELP", "ALBUMINELP", "A1GS", "A2GS", "BETS", "BETA2SER", "GAMS", "MSPIKE", "SPEI" Lab Results  Component Value Date   TIBC 297 01/10/2023   TIBC 231 (L) 10/11/2022   TIBC 337 07/05/2022   FERRITIN 59 01/10/2023   FERRITIN 121 10/11/2022   FERRITIN 32 07/05/2022   IRONPCTSAT 23 01/10/2023   IRONPCTSAT 19 10/11/2022   IRONPCTSAT 18 07/05/2022   Lab Results  Component Value Date   LDH 123 03/31/2022     STUDIES:   CT CHEST ABDOMEN PELVIS WO CONTRAST  Result Date: 01/17/2023 CLINICAL DATA:  Renal cell carcinoma.  Restaging. * Tracking Code: BO * EXAM: CT CHEST, ABDOMEN AND PELVIS WITHOUT CONTRAST TECHNIQUE: Multidetector CT imaging of the chest, abdomen and pelvis was performed following the standard protocol without IV contrast. RADIATION DOSE REDUCTION: This exam was performed according to the departmental dose-optimization program which includes automated exposure control, adjustment of the mA and/or kV according to patient size and/or use of iterative reconstruction technique. COMPARISON:  07/05/2022 FINDINGS: CT CHEST FINDINGS Cardiovascular: Heart size is normal. Aortic atherosclerosis and coronary artery calcifications. No pericardial effusion. Mediastinum/Nodes: No enlarged mediastinal,  hilar, or axillary lymph nodes.  Thyroid gland, trachea, and esophagus demonstrate no significant findings. Lungs/Pleura: No pleural effusion or airspace consolidation. Multiple small scattered lung nodules are identified bilaterally. These appear unchanged compared with the previous exam. The largest is in the left upper lobe measuring 5 mm, image 46/3. Previously characterized as benign nodules with stability dating back to 2016. No new lung nodules identified. Musculoskeletal: No chest wall mass or suspicious bone lesions identified. CT ABDOMEN PELVIS FINDINGS Hepatobiliary: No focal liver abnormality is seen. Status post cholecystectomy. No biliary dilatation. Pancreas: Unremarkable. No pancreatic ductal dilatation or surrounding inflammatory changes. Spleen: Normal in size without focal abnormality. Adrenals/Urinary Tract: Status post left adrenalectomy and left nephrectomy. No signs of locally recurrent tumor within the left nephrectomy bed. Duplicated right renal collecting system identified. Nonobstructing right renal calculi are again noted measuring up to 4 mm. Scarring with calcifications identified within the right mid kidney appears similar to the previous exam. No right-sided hydronephrosis or mass identified urinary bladder appears normal. Stomach/Bowel: Stomach is normal. The appendix is visualized and is within normal limits. No pathologic dilatation of the large or small bowel loops. Scattered colonic diverticula without signs of acute diverticulitis. Vascular/Lymphatic: Aortic atherosclerosis. No signs of abdominopelvic adenopathy. Reproductive: Status post hysterectomy. No adnexal masses. Other: No ascites or focal fluid collections. No signs of pneumoperitoneum. Musculoskeletal: No suspicious bone lesions. Degenerative changes within the hips and lumbar spine. IMPRESSION: 1. Status post left adrenalectomy and left nephrectomy. No signs of locally recurrent tumor within the left nephrectomy bed. 2. No signs of metastatic disease  within the chest, abdomen or pelvis. 3. Stable appearance of small scattered lung nodules. Previously characterized as benign nodules with stability dating back to 2016. 4. Nonobstructing right renal calculi. 5. Coronary artery calcifications. 6.  Aortic Atherosclerosis (ICD10-I70.0). Electronically Signed   By: Signa Kell M.D.   On: 01/17/2023 15:15

## 2023-01-21 ENCOUNTER — Inpatient Hospital Stay: Payer: PPO

## 2023-01-21 VITALS — BP 163/58 | HR 65 | Temp 97.3°F | Resp 18

## 2023-01-21 DIAGNOSIS — D509 Iron deficiency anemia, unspecified: Secondary | ICD-10-CM | POA: Diagnosis not present

## 2023-01-21 MED ORDER — SODIUM CHLORIDE 0.9 % IV SOLN: INTRAVENOUS | Status: DC

## 2023-01-21 MED ORDER — SODIUM CHLORIDE 0.9 % IV SOLN
1000.0000 mg | Freq: Once | INTRAVENOUS | Status: AC
  Administered 2023-01-21: 1000 mg via INTRAVENOUS
  Filled 2023-01-21: qty 1000

## 2023-01-21 MED ORDER — CETIRIZINE HCL 10 MG PO TABS
10.0000 mg | ORAL_TABLET | Freq: Once | ORAL | Status: AC
Start: 2023-01-21 — End: 2023-01-21
  Administered 2023-01-21: 10 mg via ORAL
  Filled 2023-01-21: qty 1

## 2023-01-21 MED ORDER — ACETAMINOPHEN 325 MG PO TABS
650.0000 mg | ORAL_TABLET | Freq: Once | ORAL | Status: AC
Start: 2023-01-21 — End: 2023-01-21
  Administered 2023-01-21: 650 mg via ORAL
  Filled 2023-01-21: qty 2

## 2023-01-21 NOTE — Patient Instructions (Signed)
[facility] - A DEPT OF [facility]  Discharge Instructions: Thank you for choosing [facility] to provide your oncology and hematology care.  If you have a lab appointment with the Cancer Center - please note that after April 8th, 2024, all labs will be drawn in the cancer center.  You do not have to check in or register with the main entrance as you have in the past but will complete your check-in in the cancer center.  Wear comfortable clothing and clothing appropriate for easy access to any Portacath or PICC line.   We strive to give you quality time with your provider. You may need to reschedule your appointment if you arrive late (15 or more minutes).  Arriving late affects you and other patients whose appointments are after yours.  Also, if you miss three or more appointments without notifying the office, you may be dismissed from the clinic at the provider's discretion.      For prescription refill requests, have your pharmacy contact our office and allow 72 hours for refills to be completed.    Today you received the following chemotherapy and/or immunotherapy agents Monoferric      To help prevent nausea and vomiting after your treatment, we encourage you to take your nausea medication as directed.  BELOW ARE SYMPTOMS THAT SHOULD BE REPORTED IMMEDIATELY: *FEVER GREATER THAN 100.4 F (38 C) OR HIGHER *CHILLS OR SWEATING *NAUSEA AND VOMITING THAT IS NOT CONTROLLED WITH YOUR NAUSEA MEDICATION *UNUSUAL SHORTNESS OF BREATH *UNUSUAL BRUISING OR BLEEDING *URINARY PROBLEMS (pain or burning when urinating, or frequent urination) *BOWEL PROBLEMS (unusual diarrhea, constipation, pain near the anus) TENDERNESS IN MOUTH AND THROAT WITH OR WITHOUT PRESENCE OF ULCERS (sore throat, sores in mouth, or a toothache) UNUSUAL RASH, SWELLING OR PAIN  UNUSUAL VAGINAL DISCHARGE OR ITCHING   Items with * indicate a potential emergency and should be followed  up as soon as possible or go to the Emergency Department if any problems should occur.  Please show the CHEMOTHERAPY ALERT CARD or IMMUNOTHERAPY ALERT CARD at check-in to the Emergency Department and triage nurse.  Should you have questions after your visit or need to cancel or reschedule your appointment, please contact [facility] - A DEPT OF [facility] [phone]  and follow the prompts.  Office hours are 8:00 a.m. to 4:30 p.m. Monday - Friday. Please note that voicemails left after 4:00 p.m. may not be returned until the following business day.  We are closed weekends and major holidays. You have access to a nurse at all times for urgent questions. Please call the main number to the clinic [phone] and follow the prompts.  For any non-urgent questions, you may also contact your provider using MyChart. We now offer e-Visits for anyone 76 and older to request care online for non-urgent symptoms. For details visit [email or web address].   Also download the MyChart app! Go to the app store, search "MyChart", open the app, select [facility], and log in with your MyChart username and password.

## 2023-01-21 NOTE — Progress Notes (Signed)
Patient presents today for Monoferric infusion per providers order.  Vital signs WNL.  Patient has no new complaints at this time.    Peripheral IV started and blood return noted pre and post infusion.  Stable during infusion without adverse affects.  Vital signs stable.  No complaints at this time.  Discharge from clinic ambulatory in stable condition.  Alert and oriented X 3.  Follow up with [facility] as scheduled.

## 2023-01-28 ENCOUNTER — Encounter: Payer: Self-pay | Admitting: Nurse Practitioner

## 2023-01-28 ENCOUNTER — Ambulatory Visit (INDEPENDENT_AMBULATORY_CARE_PROVIDER_SITE_OTHER): Payer: PPO | Admitting: Nurse Practitioner

## 2023-01-28 VITALS — BP 107/67 | HR 78 | Ht 64.0 in | Wt 142.0 lb

## 2023-01-28 DIAGNOSIS — E1159 Type 2 diabetes mellitus with other circulatory complications: Secondary | ICD-10-CM

## 2023-01-28 DIAGNOSIS — E559 Vitamin D deficiency, unspecified: Secondary | ICD-10-CM

## 2023-01-28 DIAGNOSIS — Z794 Long term (current) use of insulin: Secondary | ICD-10-CM | POA: Diagnosis not present

## 2023-01-28 DIAGNOSIS — I10 Essential (primary) hypertension: Secondary | ICD-10-CM

## 2023-01-28 DIAGNOSIS — E782 Mixed hyperlipidemia: Secondary | ICD-10-CM

## 2023-01-28 LAB — POCT GLYCOSYLATED HEMOGLOBIN (HGB A1C): Hemoglobin A1C: 7.7 % — AB (ref 4.0–5.6)

## 2023-01-28 NOTE — Progress Notes (Signed)
01/28/2023, 11:31 AM                                Endocrinology follow-up note   Subjective:    Patient ID: Grace Hess, female    DOB: [birth year].  Grace Hess is being seen in follow-up for management of currently uncontrolled symptomatic diabetes requested by  Elfredia Nevins, MD.   Past Medical History:  Diagnosis Date   Anxiety    CAD (coronary artery disease)    a. s/p DES to RCA in 07/2016 with residual mid-LAD stenosis   Cancer (HCC)    skin cancer on finger,, cancer left kidney   Cervicalgia    Chest pain, unspecified    Chronic kidney disease    Depression    GERD (gastroesophageal reflux disease)    Headache    "bad ones after my stroke in 2016; only have them when I get bad news now" (08/02/2016)   Heart murmur    Heavy smoker (more than 20 cigarettes per day)    Scheduled for LDCT screening 02/11/15   History of hiatal hernia    History of kidney stones    "no cysto/OR" (12/10/2014)   Hypercholesteremia    Hypertension    Obesity    Palpitations    Reflux esophagitis    Stroke [facility])    "they said I had a  MINI stroke a few months back/MRI" (12/10/2014)   TIA (transient ischemic attack) 11/28/2014   [name] 11/28/2014   Type II diabetes mellitus (HCC)     Past Surgical History:  Procedure Laterality Date   ABDOMINAL AORTOGRAM N/A 08/02/2016   Procedure: Abdominal Aortogram;  Surgeon: Yvonne Kendall, MD;  Location: [facility] INV[facility];  Service: Cardiovascular;  Laterality: N/A;   CAROTID STENT     CHOLECYSTECTOMY OPEN  1978   COLONOSCOPY  2010   single diverticulum in sigmoid colon   CORONARY ANGIOPLASTY WITH STENT PLACEMENT  08/02/2016   to RCA   CORONARY STENT INTERVENTION N/A 08/02/2016   Procedure: Coronary Stent Intervention;  Surgeon: Yvonne Kendall, MD;  Location: [facility] CV LAB;  Service: Cardiovascular;  Laterality: N/A;  RCA   CYSTOSCOPY W/  RETROGRADES Bilateral 07/15/2021   Procedure: CYSTOSCOPY WITH RETROGRADE PYELOGRAM;  Surgeon: Milderd Meager., MD;  Location: [facility] ORS;  Service: Urology;  Laterality: Bilateral;   CYSTOSCOPY WITH STENT PLACEMENT Left 07/15/2021   Procedure: CYSTOSCOPY WITH STENT PLACEMENT;  Surgeon: Milderd Meager., MD;  Location: [facility] ORS;  Service: Urology;  Laterality: Left;   ENDARTERECTOMY Left 12/02/2014   Procedure: ENDARTERECTOMY CAROTID;  Surgeon: Larina Earthly, MD;  Location: [facility] OR;  Service: Vascular;  Laterality: Left;   HERNIA REPAIR     LAPAROSCOPIC NEPHRECTOMY Left    LAPAROSCOPIC NEPHRECTOMY Left 11/25/2021   Procedure: LEFT LAPAROSCOPIC RADICAL NEPHRECTOMY;  Surgeon: Crist Fat, MD;  Location: [facility] ORS;  Service: Urology;  Laterality: Left;   LEFT HEART CATH AND CORONARY ANGIOGRAPHY N/A 08/02/2016   Procedure: Left Heart Cath and Coronary Angiography;  Surgeon: Yvonne Kendall,  MD;  Location: [facility];  Service: Cardiovascular;  Laterality: N/A;   LOWER EXTREMITY ANGIOGRAM  08/02/2016   LOWER EXTREMITY ANGIOGRAPHY N/A 08/02/2016   Procedure: Lower Extremity Angiography;  Surgeon: Yvonne Kendall, MD;  Location: [facility];  Service: Cardiovascular;  Laterality: N/A;   NEPHRECTOMY Left    VAGINAL HYSTERECTOMY  1991   "partial"   VENTRAL HERNIA REPAIR N/A 08/05/2022   Procedure: LAPAROSCOPIC VENTRAL WALL HERNIA REPAIR WITH MESH;  Surgeon: Karie Soda, MD;  Location: [facility] OR[facility];  Service: General;  Laterality: N/A;    Social History   Socioeconomic History   Marital status: Married    Spouse name: Not on file   Number of children: 2   Years of education: 12   Highest education level: Not on file  Occupational History   Not on file  Tobacco Use   Smoking status: Every Day    Current packs/day: 1.50    Average packs/day: 1.5 packs/day for 48.7 years (73.1 ttl pk-yrs)    Types: Cigarettes    Start date: 05/17/1974   Smokeless tobacco: Never  Vaping Use    Vaping status: Never Used  Substance and Sexual Activity   Alcohol use: No    Alcohol/week: 0.0 standard drinks of alcohol   Drug use: No   Sexual activity: Not Currently  Other Topics Concern   Not on file  Social History Narrative   Patient does not drink caffeine.   Patient is right handed.    Social Determinants of Health   Financial Resource Strain: Low Risk  (05/18/2022)   Received from [facility], [facility]   Overall Financial Resource Strain (CARDIA)    Difficulty of Paying Living Expenses: Not hard at all  Food Insecurity: No Food Insecurity (05/18/2022)   Received from [facility], [facility]   Hunger Vital Sign    Worried About Running Out of Food in the Last Year: Never true    Ran Out of Food in the Last Year: Never true  Transportation Needs: No Transportation Needs (05/18/2022)   Received from [facility], [facility]   PRAPARE - Transportation    Lack of Transportation (Medical): No    Lack of Transportation (Non-Medical): No  Physical Activity: Not on file  Stress: Not on file  Social Connections: Unknown (04/08/2022)   Received from [facility], [facility]   Social Network    Social Network: Not on file    Family History  Problem Relation Age of Onset   Congestive Heart Failure Mother    COPD Father    Cancer Brother    Cancer Brother    Breast cancer Neg Hx     Outpatient Encounter Medications as of 01/28/2023  Medication Sig   acetaminophen (TYLENOL) 325 MG tablet Take 2 tablets (650 mg total) by mouth every 4 (four) hours as needed for headache or mild pain.   albuterol (VENTOLIN HFA) 108 (90 Base) MCG/ACT inhaler Inhale 1-2 puffs into the lungs every 4 (four) hours as needed for shortness of breath or wheezing.   ALPRAZolam (XANAX) 0.5 MG tablet Take 0.25 mg by mouth 2 (two) times daily.   Biotin 5000 MCG CAPS Take 5,000 mcg by mouth daily.   Blood Glucose Monitoring Suppl (ACCU-CHEK GUIDE) w/Device KIT 1 Piece by Does not  apply route as directed.   Budeson-Glycopyrrol-Formoterol (BREZTRI AEROSPHERE) 160-9-4.8 MCG/ACT AERO Inhale into the lungs. 1-2 times per day   calcitRIOL (ROCALTROL) 0.25 MCG capsule Take 0.25 mcg by  mouth every Monday, Wednesday, and Friday.   calcium acetate (PHOSLO) 667 MG tablet Take 667 mg by mouth daily.   Cholecalciferol (VITAMIN D3) 125 MCG (5000 UT) CAPS Take 5,000 Units by mouth once a week. Tuesday   clopidogrel (PLAVIX) 75 MG tablet Take 1 tablet (75 mg total) by mouth daily.   escitalopram (LEXAPRO) 20 MG tablet Take 20 mg by mouth daily.   gabapentin (NEURONTIN) 300 MG capsule Take 300 mg by mouth at bedtime.   glucose blood (ACCU-CHEK GUIDE) test strip Use as instructed   insulin degludec (TRESIBA FLEXTOUCH) 100 UNIT/ML FlexTouch Pen Inject 10 Units into the skin at bedtime.   Insulin Pen Needle (PEN NEEDLES) 31G X 6 MM MISC Use to inject insulin once daily   LOKELMA 10 g PACK packet Take 10 g by mouth 3 (three) times a week.   nystatin cream (MYCOSTATIN) Apply 1 application  topically 2 (two) times daily as needed (Itching).   ondansetron (ZOFRAN) 4 MG tablet TAKE 1 TABLET(4 MG) BY MOUTH TWICE DAILY AS NEEDED FOR NAUSEA OR VOMITING (Patient taking differently: Take 4 mg by mouth every 8 (eight) hours as needed for nausea.)   oxyCODONE-acetaminophen (PERCOCET/ROXICET) 5-325 MG tablet Take 1 tablet by mouth every 6 (six) hours as needed for severe pain or moderate pain.   pantoprazole (PROTONIX) 40 MG tablet Take 1 tablet (40 mg total) by mouth daily.   promethazine (PHENERGAN) 25 MG tablet Take 25 mg by mouth 4 (four) times daily as needed.   rosuvastatin (CRESTOR) 20 MG tablet TAKE 1 TABLET(20 MG) BY MOUTH DAILY   sodium bicarbonate 650 MG tablet Take 650 mg by mouth 2 (two) times daily.   [DISCONTINUED] Continuous Glucose Receiver (DEXCOM G7 RECEIVER) DEVI Use to test BG. E11.65 (Patient not taking: Reported on 01/28/2023)   [DISCONTINUED] Continuous Glucose Sensor (DEXCOM G7  SENSOR) MISC Change sensor every 10 days as directed. E11.65 (Patient not taking: Reported on 01/28/2023)   No facility-administered encounter medications on file as of 01/28/2023.    ALLERGIES: Allergies  Allergen Reactions   Codeine Shortness Of Breath   Latex Itching   Sulfa Antibiotics Other (See Comments)    Reaction:  Unknown     VACCINATION STATUS: Immunization History  Administered Date(s) Administered   Influenza,inj,Quad PF,6+ Mos 11/29/2014   Pneumococcal Polysaccharide-23 11/29/2014    Diabetes She presents for her follow-up diabetic visit. She has type 2 diabetes mellitus. Onset time: She was diagnosed at approximate age of 50 years. Her disease course has been improving. There are no hypoglycemic associated symptoms. Pertinent negatives for hypoglycemia include no confusion, headaches, pallor or seizures. Associated symptoms include fatigue and foot paresthesias. Pertinent negatives for diabetes include no chest pain, no polydipsia, no polyphagia, no polyuria and no weight loss. There are no hypoglycemic complications. Symptoms are stable. Diabetic complications include a CVA, heart disease and nephropathy. Risk factors for coronary artery disease include dyslipidemia, diabetes mellitus, family history, hypertension, tobacco exposure, sedentary lifestyle and post-menopausal. Current diabetic treatment includes diet and insulin injections. She is compliant with treatment most of the time. Her weight is decreasing steadily. She is following a generally healthy diet. When asked about meal planning, she reported none. She has not had a previous visit with a dietitian. She never participates in exercise. Her home blood glucose trend is decreasing steadily. Her breakfast blood glucose range is generally 90-110 mg/dl. Her bedtime blood glucose range is generally 140-180 mg/dl. (She presents today with her meter and logs showing at target  glycemic profile overall.  Her POCT A1c today is  7.9%, improving from last visit of 9.1%.  She does report she skips her insulin when glucose is a bit lower before her dinner.) An ACE inhibitor/angiotensin II receptor blocker is being taken. She does not see a podiatrist.Eye exam is not current.  Hyperlipidemia This is a chronic problem. The current episode started more than 1 year ago. The problem is controlled. Recent lipid tests were reviewed and are normal. Exacerbating diseases include chronic renal disease and diabetes. Factors aggravating her hyperlipidemia include beta blockers, fatty foods and smoking. Pertinent negatives include no chest pain, myalgias or shortness of breath. Current antihyperlipidemic treatment includes statins. The current treatment provides mild improvement of lipids. Compliance problems include adherence to diet and adherence to exercise.  Risk factors for coronary artery disease include diabetes mellitus, dyslipidemia, hypertension, a sedentary lifestyle, post-menopausal and family history.  Hypertension This is a chronic problem. The current episode started more than 1 year ago. The problem has been resolved since onset. The problem is controlled. Pertinent negatives include no chest pain, headaches, palpitations or shortness of breath. There are no associated agents to hypertension. Risk factors for coronary artery disease include dyslipidemia, diabetes mellitus, sedentary lifestyle, smoking/tobacco exposure and post-menopausal state. Past treatments include ACE inhibitors and beta blockers. The current treatment provides moderate improvement. Compliance problems include exercise and diet.  Hypertensive end-organ damage includes kidney disease, CAD/MI and CVA. Identifiable causes of hypertension include chronic renal disease.    Review of systems  Constitutional: + stable body weight,  current Body mass index is 24.37 kg/m. , + fatigue, no subjective hyperthermia, no subjective hypothermia Eyes: no blurry vision, no  xerophthalmia ENT: no sore throat, no nodules palpated in throat, no dysphagia/odynophagia, no hoarseness Cardiovascular: no chest pain, no shortness of breath, no palpitations, no leg swelling Respiratory: no cough, no shortness of breath Gastrointestinal: no nausea/vomiting/diarrhea Musculoskeletal: no muscle/joint aches Skin: no rashes, no hyperemia Neurological: no tremors, no dizziness, + numbness/tingling to BLE (feet) Psychiatric: no depression, no anxiety  Objective:    BP 107/67 (BP Location: Left Arm, Patient Position: Sitting, Cuff Size: Large)   Pulse 78   Ht 5\' 4"  (1.626 m)   Wt 142 lb (64.4 kg)   BMI 24.37 kg/m   Wt Readings from Last 3 Encounters:  01/28/23 142 lb (64.4 kg)  01/18/23 142 lb (64.4 kg)  11/02/22 136 lb 4.8 oz (61.8 kg)    BP Readings from Last 3 Encounters:  01/28/23 107/67  01/21/23 (!) 163/58  01/18/23 (!) 125/57     Physical Exam- Limited  Constitutional:  Body mass index is 24.37 kg/m. , not in acute distress Eyes:  EOMI, no exophthalmos Musculoskeletal: no gross deformities, strength intact in all four extremities, no gross restriction of joint movements Skin:  no rashes, no hyperemia Neurological: no tremor with outstretched hands  Diabetic Foot Exam - Simple   No data filed    CMP     Component Value Date/Time   NA 134 (L) 01/10/2023 1505   NA 141 09/28/2021 0822   K 4.8 01/10/2023 1505   CL 104 01/10/2023 1505   CO2 22 01/10/2023 1505   GLUCOSE 260 (H) 01/10/2023 1505   BUN 63 (H) 01/10/2023 1505   BUN 30 (H) 09/28/2021 0822   CREATININE 3.20 (H) 01/10/2023 1505   CREATININE 1.38 (H) 08/20/2019 0719   CALCIUM 8.7 (L) 01/10/2023 1505   PROT 6.8 01/10/2023 1505   PROT 6.6 09/28/2021 9147  ALBUMIN 3.7 01/10/2023 1505   ALBUMIN 4.2 09/28/2021 0822   AST 13 (L) 01/10/2023 1505   ALT 11 01/10/2023 1505   ALKPHOS 56 01/10/2023 1505   BILITOT 0.2 (L) 01/10/2023 1505   BILITOT 0.3 09/28/2021 0822   GFRNONAA 15 (L)  01/10/2023 1505   GFRNONAA 40 (L) 08/20/2019 0719   GFRAA 51 (L) 03/26/2020 0927   GFRAA 47 (L) 08/20/2019 0719     Diabetic Labs (most recent): Lab Results  Component Value Date   HGBA1C 7.7 (A) 01/28/2023   HGBA1C 9.1 (A) 10/26/2022   HGBA1C 9.5 (H) 07/27/2022   MICROALBUR 26.7 08/20/2019    Lipid Panel     Component Value Date/Time   CHOL 161 03/30/2021 0803   TRIG 212 (H) 03/30/2021 0803   HDL 35 (L) 03/30/2021 0803   CHOLHDL 4.6 (H) 03/30/2021 0803   CHOLHDL 3.6 01/16/2019 0937   VLDL UNABLE TO CALCULATE IF TRIGLYCERIDE OVER 400 mg/dL 29/56/2130 8657   LDLCALC 90 03/30/2021 0803   LDLCALC 69 01/16/2019 0937   LABVLDL 36 03/30/2021 0803       Assessment & Plan:   1) DM type 2 causing vascular disease (HCC)  - Grace Hess has currently uncontrolled symptomatic type 2 DM since  67 years of age.  She presents today with her meter and logs showing at target glycemic profile overall.  Her POCT A1c today is 7.9%, improving from last visit of 9.1%.  She does report she skips her insulin when glucose is a bit lower before her dinner.  -Recent labs reviewed showing worsening kidney function- will refer to nephrology for further evaluation and treatment.  - I had a long discussion with her about the progressive nature of diabetes and the pathology behind its complications. -her diabetes is complicated by coronary artery disease, CVA, nephropathy, peripheral neuropathy, chronic heavy smoking, sedentary life and she remains at a high risk for more acute and chronic complications which include CAD, CVA, CKD, retinopathy, and neuropathy. These are all discussed in detail with her.  - Nutritional counseling repeated at each appointment due to patients tendency to fall back in to old habits.  - The patient admits there is a room for improvement in their diet and drink choices. -  Suggestion is made for the patient to avoid simple carbohydrates from their diet including Cakes,  Sweet Desserts / Pastries, Ice Cream, Soda (diet and regular), Sweet Tea, Candies, Chips, Cookies, Sweet Pastries, Store Bought Juices, Alcohol in Excess of 1-2 drinks a day, Artificial Sweeteners, Coffee Creamer, and "Sugar-free" Products. This will help patient to have stable blood glucose profile and potentially avoid unintended weight gain.   - I encouraged the patient to switch to unprocessed or minimally processed complex starch and increased protein intake (animal or plant source), fruits, and vegetables.   - Patient is advised to stick to a routine mealtimes to eat 3 meals a day and avoid unnecessary snacks (to snack only to correct hypoglycemia).  - I have approached her with the following individualized plan to manage  her diabetes and patient agrees:   -Given her improved glycemic profile, she is advised to continue Tresiba 10 units SQ nightly.    She is advised to restart monitoring glucose twice daily, before breakfast and before bed, and to call the clinic if she has readings less than 70 or above 200 for 3 tests in a row.  She never heard from the pharmacy about her Dexcom.  I sent script today to  [facility] for fulfillment.  She is not a candidate for Metformin due to kidney disease, she does not meet criteria for GLP1 use with BMI less than 25.  She was on Glipizide in the past but had severe hypoglycemia.  - Specific targets for  A1c;  LDL, HDL, Triglycerides, and  Waist Circumference were discussed with the patient.  2) Blood Pressure /Hypertension:  Her blood pressure is tight, recently taken off all BP meds.    3) Lipids/Hyperlipidemia:   Her most recent lipid panel from 03/30/21 shows controlled LDL of 90 and elevated triglycerides of 212.  She is advised to continue Crestor 20 mg po daily at bedtime.  Side effects and precautions discussed with her.  She is advised to avoid fried foods and butter.    4)  Weight/Diet:  Her Body mass index is 24.37 kg/m..  she is not a  candidate for more weight loss.   Exercise, and detailed carbohydrates information provided  -  detailed on discharge instructions.  5) Chronic Care/Health Maintenance: -she is on ACEI/ARB and Statin medications and is encouraged to initiate and continue to follow up with Ophthalmology, Dentist, Podiatrist at least yearly or according to recommendations, and advised to quit smoking. I have recommended yearly flu vaccine and pneumonia vaccine at least every 5 years; moderate intensity exercise for up to 150 minutes weekly; and sleep for at least 7 hours a day.  - she is advised to maintain close follow up with Elfredia Nevins, MD for primary care needs (will be calling him about her fatigue to have iron checked), as well as her other providers for optimal and coordinated care (also reaching out to surgeon about the mass that has appeared.        I spent  29  minutes in the care of the patient today including review of labs from CMP, Lipids, Thyroid Function, Hematology (current and previous including abstractions from other facilities); face-to-face time discussing  her blood glucose readings/logs, discussing hypoglycemia and hyperglycemia episodes and symptoms, medications doses, her options of short and long term treatment based on the latest standards of care / guidelines;  discussion about incorporating lifestyle medicine;  and documenting the encounter. Risk reduction counseling performed per USPSTF guidelines to reduce obesity and cardiovascular risk factors.     Please refer to Patient Instructions for Blood Glucose Monitoring and Insulin/Medications Dosing Guide"  in media tab for additional information. Please  also refer to " Patient Self Inventory" in the Media  tab for reviewed elements of pertinent patient history.  Grace Hess participated in the discussions, expressed understanding, and voiced agreement with the above plans.  All questions were answered to her satisfaction. she is  encouraged to contact clinic should she have any questions or concerns prior to her return visit.   Follow up plan: - Return in about 3 months (around 04/30/2023) for Diabetes F/U with A1c in office, No previsit labs, Bring meter and logs.  Ronny Bacon, [facility] [address] [place], [place] [address] Phone: [phone] Fax: [phone]  01/28/2023, 11:31 AM

## 2023-02-01 DIAGNOSIS — E11649 Type 2 diabetes mellitus with hypoglycemia without coma: Secondary | ICD-10-CM | POA: Diagnosis not present

## 2023-02-02 DIAGNOSIS — N184 Chronic kidney disease, stage 4 (severe): Secondary | ICD-10-CM | POA: Diagnosis not present

## 2023-02-02 DIAGNOSIS — E1122 Type 2 diabetes mellitus with diabetic chronic kidney disease: Secondary | ICD-10-CM | POA: Diagnosis not present

## 2023-02-02 DIAGNOSIS — I10 Essential (primary) hypertension: Secondary | ICD-10-CM | POA: Diagnosis not present

## 2023-02-02 DIAGNOSIS — I739 Peripheral vascular disease, unspecified: Secondary | ICD-10-CM | POA: Diagnosis not present

## 2023-02-02 DIAGNOSIS — K219 Gastro-esophageal reflux disease without esophagitis: Secondary | ICD-10-CM | POA: Diagnosis not present

## 2023-02-02 DIAGNOSIS — F411 Generalized anxiety disorder: Secondary | ICD-10-CM | POA: Diagnosis not present

## 2023-02-02 DIAGNOSIS — M1991 Primary osteoarthritis, unspecified site: Secondary | ICD-10-CM | POA: Diagnosis not present

## 2023-02-02 DIAGNOSIS — R928 Other abnormal and inconclusive findings on diagnostic imaging of breast: Secondary | ICD-10-CM | POA: Diagnosis not present

## 2023-02-02 DIAGNOSIS — E1165 Type 2 diabetes mellitus with hyperglycemia: Secondary | ICD-10-CM | POA: Diagnosis not present

## 2023-02-02 DIAGNOSIS — Z6824 Body mass index (BMI) 24.0-24.9, adult: Secondary | ICD-10-CM | POA: Diagnosis not present

## 2023-02-02 DIAGNOSIS — E875 Hyperkalemia: Secondary | ICD-10-CM | POA: Diagnosis not present

## 2023-02-07 ENCOUNTER — Encounter (INDEPENDENT_AMBULATORY_CARE_PROVIDER_SITE_OTHER): Payer: Self-pay | Admitting: *Deleted

## 2023-02-21 ENCOUNTER — Other Ambulatory Visit: Payer: Self-pay | Admitting: Cardiology

## 2023-02-24 ENCOUNTER — Other Ambulatory Visit: Payer: Self-pay

## 2023-02-24 DIAGNOSIS — I6529 Occlusion and stenosis of unspecified carotid artery: Secondary | ICD-10-CM

## 2023-03-03 DIAGNOSIS — E11649 Type 2 diabetes mellitus with hypoglycemia without coma: Secondary | ICD-10-CM | POA: Diagnosis not present

## 2023-03-07 DIAGNOSIS — D631 Anemia in chronic kidney disease: Secondary | ICD-10-CM | POA: Diagnosis not present

## 2023-03-07 DIAGNOSIS — E211 Secondary hyperparathyroidism, not elsewhere classified: Secondary | ICD-10-CM | POA: Diagnosis not present

## 2023-03-07 DIAGNOSIS — D649 Anemia, unspecified: Secondary | ICD-10-CM | POA: Diagnosis not present

## 2023-03-07 DIAGNOSIS — E1122 Type 2 diabetes mellitus with diabetic chronic kidney disease: Secondary | ICD-10-CM | POA: Diagnosis not present

## 2023-03-07 DIAGNOSIS — N184 Chronic kidney disease, stage 4 (severe): Secondary | ICD-10-CM | POA: Diagnosis not present

## 2023-03-07 DIAGNOSIS — N2581 Secondary hyperparathyroidism of renal origin: Secondary | ICD-10-CM | POA: Diagnosis not present

## 2023-03-07 DIAGNOSIS — R809 Proteinuria, unspecified: Secondary | ICD-10-CM | POA: Diagnosis not present

## 2023-03-07 DIAGNOSIS — N189 Chronic kidney disease, unspecified: Secondary | ICD-10-CM | POA: Diagnosis not present

## 2023-03-10 DIAGNOSIS — I129 Hypertensive chronic kidney disease with stage 1 through stage 4 chronic kidney disease, or unspecified chronic kidney disease: Secondary | ICD-10-CM | POA: Diagnosis not present

## 2023-03-10 DIAGNOSIS — E1122 Type 2 diabetes mellitus with diabetic chronic kidney disease: Secondary | ICD-10-CM | POA: Diagnosis not present

## 2023-03-10 DIAGNOSIS — N200 Calculus of kidney: Secondary | ICD-10-CM | POA: Diagnosis not present

## 2023-03-10 DIAGNOSIS — N2581 Secondary hyperparathyroidism of renal origin: Secondary | ICD-10-CM | POA: Diagnosis not present

## 2023-03-11 ENCOUNTER — Ambulatory Visit (HOSPITAL_COMMUNITY)
Admission: RE | Admit: 2023-03-11 | Discharge: 2023-03-11 | Disposition: A | Discharge status: Home / Self Care | Payer: PPO | Source: Ambulatory Visit | Attending: Cardiology | Admitting: Cardiology

## 2023-03-11 DIAGNOSIS — I6529 Occlusion and stenosis of unspecified carotid artery: Secondary | ICD-10-CM | POA: Diagnosis not present

## 2023-03-11 DIAGNOSIS — I6521 Occlusion and stenosis of right carotid artery: Secondary | ICD-10-CM | POA: Diagnosis not present

## 2023-03-30 ENCOUNTER — Telehealth: Payer: Self-pay | Admitting: Cardiology

## 2023-03-30 DIAGNOSIS — I779 Disorder of arteries and arterioles, unspecified: Secondary | ICD-10-CM

## 2023-03-30 NOTE — Telephone Encounter (Signed)
-----   Message from Armida Lander sent at 03/29/2023  3:21 PM EST ----- Carotid US  shows some progression of her carotid stenosis, needs to reestablish with vascular surgery, please refer for carotid stenosis  Letta Raw MD

## 2023-03-30 NOTE — Telephone Encounter (Signed)
 Patient notified and verbalized understanding of results. Referral placed for Vascular Surgery.

## 2023-03-30 NOTE — Telephone Encounter (Signed)
 Patient returned staff call regarding results.

## 2023-04-03 DIAGNOSIS — E11649 Type 2 diabetes mellitus with hypoglycemia without coma: Secondary | ICD-10-CM | POA: Diagnosis not present

## 2023-04-13 DIAGNOSIS — Z6824 Body mass index (BMI) 24.0-24.9, adult: Secondary | ICD-10-CM | POA: Diagnosis not present

## 2023-04-13 DIAGNOSIS — G894 Chronic pain syndrome: Secondary | ICD-10-CM | POA: Diagnosis not present

## 2023-05-02 NOTE — Progress Notes (Unsigned)
 [facility] [place]  ASSESSMENT / PLAN: Grace Hess is a 68 y.o. female with history of left carotid endarterectomy 12/02/14 with Dr. Arbie Cookey for symptomatic stenosis.  Recommend:  Abstinence from all tobacco products. Blood glucose control with goal A1c < 7%. Blood pressure control with goal blood pressure < 140/90 mmHg. Lipid reduction therapy with goal LDL-C <100 mg/dL  Aspirin 81mg  PO QD.  Atorvastatin 40-80mg  PO QD (or other "high intensity" statin therapy).  Follow up with me in 1 year with carotid duplex.  CHIEF COMPLAINT: ***  HISTORY OF PRESENT ILLNESS: Grace Hess is a 68 y.o. female ***  VASCULAR SURGICAL HISTORY: ***  VASCULAR RISK FACTORS: {FINDINGS; POSITIVE NEGATIVE:[id number]} history of stroke / transient ischemic attack. {FINDINGS; POSITIVE NEGATIVE:[id number]} history of coronary artery disease. *** history of PCI. *** history of CABG.  {FINDINGS; POSITIVE NEGATIVE:[id number]} history of diabetes mellitus. Last A1c ***. {FINDINGS; POSITIVE NEGATIVE:[id number]} history of smoking. *** actively smoking. {FINDINGS; POSITIVE NEGATIVE:[id number]} history of hypertension. *** drug regimen with *** control. {FINDINGS; POSITIVE NEGATIVE:[id number]} history of chronic kidney disease.  Last GFR ***. CKD {stage:30421363}. {FINDINGS; POSITIVE NEGATIVE:[id number]} history of chronic obstructive pulmonary disease, treated with ***.  FUNCTIONAL STATUS: ECOG performance status: {findings; ecog performance status:31780} Ambulatory status: {TNHAmbulation:25868}  CAREY 1 AND 3 YEAR INDEX Female (2pts) 75-79 or 80-84 (2pts) >84 (3pts) Dependence in toileting (1pt) Partial or full dependence in dressing (1pt) History of malignant neoplasm (2pts) CHF (3pts) COPD (1pts) CKD (3pts)  0-3 pts 6% 1 year mortality ; 21% 3 year mortality 4-5 pts 12% 1 year mortality ; 36% 3 year mortality >5 pts 21% 1 year mortality; 54% 3 year mortality   Past  Medical History:  Diagnosis Date   Anxiety    CAD (coronary artery disease)    a. s/p DES to RCA in 07/2016 with residual mid-LAD stenosis   Cancer (HCC)    skin cancer on finger,, cancer left kidney   Cervicalgia    Chest pain, unspecified    Chronic kidney disease    Depression    GERD (gastroesophageal reflux disease)    Headache    "bad ones after my stroke in 2016; only have them when I get bad news now" (08/02/2016)   Heart murmur    Heavy smoker (more than 20 cigarettes per day)    Scheduled for LDCT screening 02/11/15   History of hiatal hernia    History of kidney stones    "no cysto/OR" (12/10/2014)   Hypercholesteremia    Hypertension    Obesity    Palpitations    Reflux esophagitis    Stroke [facility])    "they said I had a  MINI stroke a few months back/MRI" (12/10/2014)   TIA (transient ischemic attack) 11/28/2014   [name] 11/28/2014   Type II diabetes mellitus (HCC)     Past Surgical History:  Procedure Laterality Date   ABDOMINAL AORTOGRAM N/A 08/02/2016   Procedure: Abdominal Aortogram;  Surgeon: Yvonne Kendall, MD;  Location: [facility];  Service: Cardiovascular;  Laterality: N/A;   CAROTID STENT     CHOLECYSTECTOMY OPEN  1978   COLONOSCOPY  2010   single diverticulum in sigmoid colon   CORONARY ANGIOPLASTY WITH STENT PLACEMENT  08/02/2016   to RCA   CORONARY STENT INTERVENTION N/A 08/02/2016   Procedure: Coronary Stent Intervention;  Surgeon: Yvonne Kendall, MD;  Location: [facility];  Service: Cardiovascular;  Laterality: N/A;  RCA   CYSTOSCOPY W/ RETROGRADES Bilateral 07/15/2021  Procedure: CYSTOSCOPY WITH RETROGRADE PYELOGRAM;  Surgeon: Milderd Meager., MD;  Location: [facility];  Service: Urology;  Laterality: Bilateral;   CYSTOSCOPY WITH STENT PLACEMENT Left 07/15/2021   Procedure: CYSTOSCOPY WITH STENT PLACEMENT;  Surgeon: Milderd Meager., MD;  Location: [facility];  Service: Urology;  Laterality: Left;   ENDARTERECTOMY Left  12/02/2014   Procedure: ENDARTERECTOMY CAROTID;  Surgeon: Larina Earthly, MD;  Location: [facility] OR;  Service: Vascular;  Laterality: Left;   HERNIA REPAIR     LAPAROSCOPIC NEPHRECTOMY Left    LAPAROSCOPIC NEPHRECTOMY Left 11/25/2021   Procedure: LEFT LAPAROSCOPIC RADICAL NEPHRECTOMY;  Surgeon: Crist Fat, MD;  Location: [facility];  Service: Urology;  Laterality: Left;   LEFT HEART CATH AND CORONARY ANGIOGRAPHY N/A 08/02/2016   Procedure: Left Heart Cath and Coronary Angiography;  Surgeon: Yvonne Kendall, MD;  Location: [facility];  Service: Cardiovascular;  Laterality: N/A;   LOWER EXTREMITY ANGIOGRAM  08/02/2016   LOWER EXTREMITY ANGIOGRAPHY N/A 08/02/2016   Procedure: Lower Extremity Angiography;  Surgeon: Yvonne Kendall, MD;  Location: [facility];  Service: Cardiovascular;  Laterality: N/A;   NEPHRECTOMY Left    VAGINAL HYSTERECTOMY  1991   "partial"   VENTRAL HERNIA REPAIR N/A 08/05/2022   Procedure: LAPAROSCOPIC VENTRAL WALL HERNIA REPAIR WITH MESH;  Surgeon: Karie Soda, MD;  Location: [facility];  Service: General;  Laterality: N/A;    Family History  Problem Relation Age of Onset   Congestive Heart Failure Mother    COPD Father    Cancer Brother    Cancer Brother    Breast cancer Neg Hx     Social History   Socioeconomic History   Marital status: Married    Spouse name: Not on file   Number of children: 2   Years of education: 12   Highest education level: Not on file  Occupational History   Not on file  Tobacco Use   Smoking status: Every Day    Current packs/day: 1.50    Average packs/day: 1.5 packs/day for 49.0 years (73.4 ttl pk-yrs)    Types: Cigarettes    Start date: 05/17/1974   Smokeless tobacco: Never  Vaping Use   Vaping status: Never Used  Substance and Sexual Activity   Alcohol use: No    Alcohol/week: 0.0 standard drinks of alcohol   Drug use: No   Sexual activity: Not Currently  Other Topics Concern   Not on file  Social  History Narrative   Patient does not drink caffeine.   Patient is right handed.    Social Drivers of Corporate investment banker Strain: Low Risk  (05/18/2022)   Received from [facility], [facility]   Overall Financial Resource Strain (CARDIA)    Difficulty of Paying Living Expenses: Not hard at all  Food Insecurity: No Food Insecurity (05/18/2022)   Received from [facility], [facility]   Hunger Vital Sign    Worried About Running Out of Food in the Last Year: Never true    Ran Out of Food in the Last Year: Never true  Transportation Needs: No Transportation Needs (05/18/2022)   Received from [facility], [facility]   PRAPARE - Transportation    Lack of Transportation (Medical): No    Lack of Transportation (Non-Medical): No  Physical Activity: Not on file  Stress: Not on file  Social Connections: Unknown (04/08/2022)   [facility]   Social Network    Social Network: Not  on file  Intimate Partner Violence: Unknown (04/08/2022)   Received from [facility], [facility]   HITS    Physically Hurt: Not on file    Insult or Talk Down To: Not on file    Threaten Physical Harm: Not on file    Scream or Curse: Not on file    Allergies  Allergen Reactions   Codeine Shortness Of Breath   Latex Itching   Sulfa Antibiotics Other (See Comments)    Reaction:  Unknown     Current Outpatient Medications  Medication Sig Dispense Refill   acetaminophen (TYLENOL) 325 MG tablet Take 2 tablets (650 mg total) by mouth every 4 (four) hours as needed for headache or mild pain.     albuterol (VENTOLIN HFA) 108 (90 Base) MCG/ACT inhaler Inhale 1-2 puffs into the lungs every 4 (four) hours as needed for shortness of breath or wheezing.     ALPRAZolam (XANAX) 0.5 MG tablet Take 0.25 mg by mouth 2 (two) times daily.     Biotin 5000 MCG CAPS Take 5,000 mcg by mouth daily.     Blood Glucose Monitoring Suppl (ACCU-CHEK GUIDE) w/Device KIT 1 Piece by Does  not apply route as directed. 1 kit 0   Budeson-Glycopyrrol-Formoterol (BREZTRI AEROSPHERE) 160-9-4.8 MCG/ACT AERO Inhale into the lungs. 1-2 times per day     calcitRIOL (ROCALTROL) 0.25 MCG capsule Take 0.25 mcg by mouth every Monday, Wednesday, and Friday.     calcium acetate (PHOSLO) 667 MG tablet Take 667 mg by mouth daily.     Cholecalciferol (VITAMIN D3) 125 MCG (5000 UT) CAPS Take 5,000 Units by mouth once a week. Tuesday     clopidogrel (PLAVIX) 75 MG tablet Take 1 tablet (75 mg total) by mouth daily. 30 tablet 3   escitalopram (LEXAPRO) 20 MG tablet Take 20 mg by mouth daily.     gabapentin (NEURONTIN) 300 MG capsule Take 300 mg by mouth at bedtime.     glucose blood (ACCU-CHEK GUIDE) test strip Use as instructed 150 each 2   insulin degludec (TRESIBA FLEXTOUCH) 100 UNIT/ML FlexTouch Pen Inject 10 Units into the skin at bedtime.     Insulin Pen Needle (PEN NEEDLES) 31G X 6 MM MISC Use to inject insulin once daily 100 each 3   LOKELMA 10 g PACK packet Take 10 g by mouth 3 (three) times a week.     nystatin cream (MYCOSTATIN) Apply 1 application  topically 2 (two) times daily as needed (Itching).  5   ondansetron (ZOFRAN) 4 MG tablet TAKE 1 TABLET(4 MG) BY MOUTH TWICE DAILY AS NEEDED FOR NAUSEA OR VOMITING (Patient taking differently: Take 4 mg by mouth every 8 (eight) hours as needed for nausea.) 20 tablet 0   oxyCODONE-acetaminophen (PERCOCET/ROXICET) 5-325 MG tablet Take 1 tablet by mouth every 6 (six) hours as needed for severe pain or moderate pain. 30 tablet 0   pantoprazole (PROTONIX) 40 MG tablet Take 1 tablet (40 mg total) by mouth daily. 90 tablet 3   promethazine (PHENERGAN) 25 MG tablet Take 25 mg by mouth 4 (four) times daily as needed.     rosuvastatin (CRESTOR) 20 MG tablet TAKE 1 TABLET(20 MG) BY MOUTH DAILY 90 tablet 1   sodium bicarbonate 650 MG tablet Take 650 mg by mouth 2 (two) times daily.     No current facility-administered medications for this visit.    PHYSICAL  EXAM There were no vitals filed for this visit.  Constitutional: *** appearing. *** distress. Appears ***  nourished.  Neurologic: CN ***. *** focal findings. *** sensory loss. Psychiatric: *** Mood and affect symmetric and appropriate. Eyes: *** No icterus. No conjunctival pallor. Ears, nose, throat: *** mucous membranes moist. Midline trachea.  Cardiac: *** rate and rhythm.  Respiratory: *** unlabored. Abdominal: *** soft, non-tender, non-distended.  Peripheral vascular: *** Extremity: *** edema. *** cyanosis. *** pallor.  Skin: *** gangrene. *** ulceration.  Lymphatic: *** Stemmer's sign. *** palpable lymphadenopathy.    PERTINENT LABORATORY AND RADIOLOGIC DATA  Most recent CBC    Latest Ref Rng & Units 01/10/2023    3:05 PM 10/11/2022   11:04 AM 08/06/2022    4:49 AM  CBC  WBC 4.0 - 10.5 K/uL 9.6  12.2    Hemoglobin 12.0 - 15.0 g/dL 40.9  81.1  91.4   Hematocrit 36.0 - 46.0 % 32.7  32.6    Platelets 150 - 400 K/uL 223  310       Most recent CMP    Latest Ref Rng & Units 01/10/2023    3:05 PM 08/06/2022    4:49 AM 07/27/2022   10:57 AM  CMP  Glucose 70 - 99 mg/dL 782   956   BUN 8 - 23 mg/dL 63   59   Creatinine 2.13 - 1.00 mg/dL 0.86  5.78  4.69   Sodium 135 - 145 mmol/L 134   134   Potassium 3.5 - 5.1 mmol/L 4.8  5.2  5.0   Chloride 98 - 111 mmol/L 104   105   CO2 22 - 32 mmol/L 22   19   Calcium 8.9 - 10.3 mg/dL 8.7   8.5   Total Protein 6.5 - 8.1 g/dL 6.8     Total Bilirubin 0.3 - 1.2 mg/dL 0.2     Alkaline Phos 38 - 126 U/L 56     AST 15 - 41 U/L 13     ALT 0 - 44 U/L 11       Renal function CrCl cannot be calculated (Patient's most recent lab result is older than the maximum 21 days allowed.).  Hemoglobin A1C (%)  Date Value  01/28/2023 7.7 (A)   HbA1c POC (<> result, manual entry) (%)  Date Value  10/01/2021 8.4   Hgb A1c MFr Bld (%)  Date Value  07/27/2022 9.5 (H)    LDL Cholesterol (Calc)  Date Value Ref Range Status  01/16/2019 69 mg/dL  (calc) Final    Comment:    Reference range: <100 . Desirable range <100 mg/dL for primary prevention;   <70 mg/dL for patients with CHD or diabetic patients  with > or = 2 CHD risk factors. Marland Kitchen LDL-C is now calculated using the Martin-Hopkins  calculation, which is a validated novel method providing  better accuracy than the Friedewald equation in the  estimation of LDL-C.  Horald Pollen et al. Lenox Ahr. 6295;284(13): 2061-2068  (http://education.[employer].com/faq/FAQ164)    LDL Chol Calc (NIH)  Date Value Ref Range Status  03/30/2021 90 0 - 99 mg/dL Final     Vascular Imaging: ***  Jonnie Kubly N. Lenell Antu, MD FACS [facility] Phone Number: [phone] 05/02/2023 3:54 PM   Total time spent on preparing this encounter including chart review, data review, collecting history, examining the patient, coordinating care for this {tnhtimebilling:26202}  Portions of this report may have been transcribed using voice recognition software.  Every effort has been made to ensure accuracy; however, inadvertent computerized transcription errors may still be present.

## 2023-05-03 ENCOUNTER — Encounter: Payer: Self-pay | Admitting: Vascular Surgery

## 2023-05-03 ENCOUNTER — Ambulatory Visit (INDEPENDENT_AMBULATORY_CARE_PROVIDER_SITE_OTHER): Payer: PPO | Admitting: Gastroenterology

## 2023-05-03 ENCOUNTER — Ambulatory Visit: Payer: PPO | Admitting: Vascular Surgery

## 2023-05-03 VITALS — BP 150/70 | HR 73

## 2023-05-03 DIAGNOSIS — I6523 Occlusion and stenosis of bilateral carotid arteries: Secondary | ICD-10-CM | POA: Diagnosis not present

## 2023-05-05 ENCOUNTER — Other Ambulatory Visit: Payer: Self-pay

## 2023-05-05 DIAGNOSIS — I6523 Occlusion and stenosis of bilateral carotid arteries: Secondary | ICD-10-CM

## 2023-05-05 NOTE — Progress Notes (Signed)
 Cardiology Office Note    Date:  05/09/2023  ID:  Grace Hess, Grace Hess [birth year], MRN [id number] PCP:  Elfredia Nevins, MD  Cardiologist:  Dina Rich, MD  Electrophysiologist:  None   Chief Complaint: f/u CAD  History of Present Illness: .    Grace Hess is a 68 y.o. female with visit-pertinent history of CAD (s/p DES to RCA in 07/2016 with residual mid-LAD stenosis), PAD (known CTO of left SFA), carotid artery stenosis (s/p L CEA, 70-99% R carotid stenosis  now followed by vascular surgery), orthostatic hypotension, bradycardia requiring hold of metoprolol, HTN, HLD, Type 2 DM, CKD stage 4-5 (followed by Dr. Wolfgang Phoenix), renal cell cancer, prior left adrenalectomy and left nephrectomy, lung nodules (felt benign per 12/2022 CT), iron deficiency anemia, aortic atherosclerosis, tobacco use.   In early 2024 she had to have metoprolol reduced and later stopped due to bradycardia and dizziness. 2D Echo 05/2022 (report listed under scanned document) showed EF 60-65%, moderate LAE, indeterminate diastolic parameters, mild calcification of the aortic valve without aortic stenosis. At last OV 09/2022 she was still orthostatic so Lasix and amlodipine were stopped with plan for 1 week f/u though did not return. Patient previously self-discontinued ASA due to easy bruising but has been agreeable to Plavix. She previously had muscle aches with high dose atorvastatin and higher dose rosuvastatin. Carotid duplex 02/2023 showed potential 70-99% stenosis on the right, s/p prior L CEA. She was referred to vascular surgery who has ordered CTA head/neck w/wo contrast. Her last creatinine in 12/2022 was 3.20. She has previously told our office she was nearing dialysis.  She returns for follow-up today with her spouse. She denies any new cardiac complaints. No CP, SOB, palpitations, edema, orthopnea, or further issues with orthostasis. No syncope. She has had generalized fatigue for quite some time. She gets  follow-up labs with Dr. Lucio Edward office tomorrow. She didn't sleep well last night because she missed her gabapentin.   Labwork independently reviewed: 01/2023 A1c 7.7 12/2022 Hgb 10.7, plt 223, Na 134, K 4.8, Cr 3.20, AST ALT not up -> CKD/anemia look chronic 03/2021 trig 212, LDL 90 2021 TSH wnl  ROS: .    Please see the history of present illness.  All other systems are reviewed and otherwise negative.  Studies Reviewed: Marland Kitchen    EKG:  EKG is ordered today, personally reviewed, demonstrating NSR 92bpm nonspecific STTW changes, unusual appearance in PR interval though stable compared to prior years  CV Studies: Cardiac studies reviewed are outlined and summarized above. Otherwise please see EMR for full report.   Current Reported Medications:.    Current Meds  Medication Sig   acetaminophen (TYLENOL) 325 MG tablet Take 2 tablets (650 mg total) by mouth every 4 (four) hours as needed for headache or mild pain.   albuterol (VENTOLIN HFA) 108 (90 Base) MCG/ACT inhaler Inhale 1-2 puffs into the lungs every 4 (four) hours as needed for shortness of breath or wheezing.   ALPRAZolam (XANAX) 0.5 MG tablet Take 0.25 mg by mouth 2 (two) times daily.   Budeson-Glycopyrrol-Formoterol (BREZTRI AEROSPHERE) 160-9-4.8 MCG/ACT AERO Inhale into the lungs. 1-2 times per day   calcitRIOL (ROCALTROL) 0.25 MCG capsule Take 0.25 mcg by mouth every Monday, Wednesday, and Friday.   calcium acetate (PHOSLO) 667 MG tablet Take 667 mg by mouth daily.   Cholecalciferol (VITAMIN D3) 125 MCG (5000 UT) CAPS Take 5,000 Units by mouth once a week. Tuesday   clopidogrel (PLAVIX) 75 MG tablet Take 1 tablet (  75 mg total) by mouth daily.   escitalopram (LEXAPRO) 20 MG tablet Take 20 mg by mouth daily.   gabapentin (NEURONTIN) 300 MG capsule Take 300 mg by mouth at bedtime.   insulin degludec (TRESIBA FLEXTOUCH) 100 UNIT/ML FlexTouch Pen Inject 10 Units into the skin at bedtime.   Insulin Pen Needle (PEN NEEDLES) 31G X 6  MM MISC Use to inject insulin once daily   LOKELMA 10 g PACK packet Take 10 g by mouth 3 (three) times a week.   nystatin cream (MYCOSTATIN) Apply 1 application  topically 2 (two) times daily as needed (Itching).   ondansetron (ZOFRAN) 4 MG tablet TAKE 1 TABLET(4 MG) BY MOUTH TWICE DAILY AS NEEDED FOR NAUSEA OR VOMITING (Patient taking differently: Take 4 mg by mouth every 8 (eight) hours as needed for nausea.)   oxyCODONE-acetaminophen (PERCOCET/ROXICET) 5-325 MG tablet Take 1 tablet by mouth every 6 (six) hours as needed for severe pain or moderate pain.   pantoprazole (PROTONIX) 40 MG tablet Take 1 tablet (40 mg total) by mouth daily.   promethazine (PHENERGAN) 25 MG tablet Take 25 mg by mouth 4 (four) times daily as needed.   rosuvastatin (CRESTOR) 20 MG tablet TAKE 1 TABLET(20 MG) BY MOUTH DAILY   sodium bicarbonate 650 MG tablet Take 650 mg by mouth 2 (two) times daily.    Physical Exam:    VS:  BP 124/68 (BP Location: Left Arm, Cuff Size: Normal)   Pulse 94   Ht 5\' 5"  (1.651 m)   Wt 140 lb (63.5 kg)   SpO2 95%   BMI 23.30 kg/m    Wt Readings from Last 3 Encounters:  05/09/23 140 lb (63.5 kg)  01/28/23 142 lb (64.4 kg)  01/18/23 142 lb (64.4 kg)    GEN: Well nourished, well developed in no acute distress NECK: No JVD; No carotid bruits CARDIAC: RRR, no murmurs, rubs, gallops RESPIRATORY:  Clear to auscultation without rales, wheezing or rhonchi  ABDOMEN: Soft, non-tender, non-distended EXTREMITIES:  No edema; No acute deformity   Asessement and Plan:.    1. CAD - asymptomatic, remains on Plavix monotherapy as above. Off BB due to prior issues with bradycardia. CrCl is <30 by last check. Max dose of rosuvastatin with this value is 10mg  daily; she is on 20mg  daily. She had prior myalgias with atorvastatin 80mg  but is willing to trial 40mg  daily. Will switch and recheck FLP/CMET in 3 months. 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 This is chronic back to 2019 even. I  reviewed with Dr. Tenny Craw, no specific intervention needed, question related to know atrial enlargement.  2. Orthostatic hypotension - resolved off amlodipine and Lasix. Follow clinically,  3. Sinus bradycardia - resolved off beta blocker. Follow for now.  4. 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 Lucio Edward office  5. PAD - followed by [facility]. I have asked her to touch base with Dr. Verita Lamb office re: the plan for CTA given her advanced kidney disease. She will call them.     Disposition: F/u with Dr. Wyline Mood in 6 months.  Signed, Laurann Montana, PA-C

## 2023-05-09 ENCOUNTER — Ambulatory Visit: Payer: PPO | Attending: Physician Assistant | Admitting: Physician Assistant

## 2023-05-09 ENCOUNTER — Encounter: Payer: Self-pay | Admitting: Physician Assistant

## 2023-05-09 VITALS — BP 124/68 | HR 94 | Ht 65.0 in | Wt 140.0 lb

## 2023-05-09 DIAGNOSIS — I2510 Atherosclerotic heart disease of native coronary artery without angina pectoris: Secondary | ICD-10-CM | POA: Diagnosis not present

## 2023-05-09 DIAGNOSIS — Z79899 Other long term (current) drug therapy: Secondary | ICD-10-CM | POA: Diagnosis not present

## 2023-05-09 DIAGNOSIS — I739 Peripheral vascular disease, unspecified: Secondary | ICD-10-CM

## 2023-05-09 DIAGNOSIS — N184 Chronic kidney disease, stage 4 (severe): Secondary | ICD-10-CM

## 2023-05-09 DIAGNOSIS — R001 Bradycardia, unspecified: Secondary | ICD-10-CM | POA: Diagnosis not present

## 2023-05-09 DIAGNOSIS — I951 Orthostatic hypotension: Secondary | ICD-10-CM

## 2023-05-09 MED ORDER — ATORVASTATIN CALCIUM 40 MG PO TABS: 40.0000 mg | ORAL_TABLET | Freq: Every day | ORAL | 3 refills | Status: DC

## 2023-05-09 NOTE — Patient Instructions (Addendum)
 Medication Instructions:  STOP Rosuvastatin  START Atorvastatin 40 mg daily at bedtime   Labwork: Fasting Lipids and CMET in 3 months at [facility] ( end of May)   Testing/Procedures: None today  Follow-Up: 6 months with Dr.Branch  Any Other Special Instructions Will Be Listed Below (If Applicable).    Speak with Dr.Hawken regarding your kidney function before CT scan  If you need a refill on your cardiac medications before your next appointment, please call your pharmacy.

## 2023-05-10 ENCOUNTER — Other Ambulatory Visit (HOSPITAL_COMMUNITY): Payer: Self-pay | Admitting: *Deleted

## 2023-05-10 ENCOUNTER — Ambulatory Visit: Payer: PPO | Admitting: Nurse Practitioner

## 2023-05-10 ENCOUNTER — Encounter: Payer: Self-pay | Admitting: Nurse Practitioner

## 2023-05-10 VITALS — BP 116/66 | HR 97 | Ht 65.0 in | Wt 137.0 lb

## 2023-05-10 DIAGNOSIS — E559 Vitamin D deficiency, unspecified: Secondary | ICD-10-CM | POA: Diagnosis not present

## 2023-05-10 DIAGNOSIS — Z794 Long term (current) use of insulin: Secondary | ICD-10-CM

## 2023-05-10 DIAGNOSIS — I10 Essential (primary) hypertension: Secondary | ICD-10-CM

## 2023-05-10 DIAGNOSIS — E782 Mixed hyperlipidemia: Secondary | ICD-10-CM | POA: Diagnosis not present

## 2023-05-10 DIAGNOSIS — N184 Chronic kidney disease, stage 4 (severe): Secondary | ICD-10-CM | POA: Diagnosis not present

## 2023-05-10 DIAGNOSIS — E1159 Type 2 diabetes mellitus with other circulatory complications: Secondary | ICD-10-CM | POA: Diagnosis not present

## 2023-05-10 DIAGNOSIS — N189 Chronic kidney disease, unspecified: Secondary | ICD-10-CM | POA: Diagnosis not present

## 2023-05-10 DIAGNOSIS — D631 Anemia in chronic kidney disease: Secondary | ICD-10-CM | POA: Diagnosis not present

## 2023-05-10 DIAGNOSIS — N185 Chronic kidney disease, stage 5: Secondary | ICD-10-CM | POA: Diagnosis not present

## 2023-05-10 DIAGNOSIS — R809 Proteinuria, unspecified: Secondary | ICD-10-CM | POA: Diagnosis not present

## 2023-05-10 LAB — POCT GLYCOSYLATED HEMOGLOBIN (HGB A1C): Hemoglobin A1C: 14.6 % — AB (ref 4.0–5.6)

## 2023-05-10 MED ORDER — PEN NEEDLES 31G X 6 MM MISC
3 refills | Status: DC
Start: 2023-05-10 — End: 2024-01-13

## 2023-05-10 MED ORDER — TRESIBA FLEXTOUCH 100 UNIT/ML ~~LOC~~ SOPN: 10.0000 [IU] | PEN_INJECTOR | Freq: Every day | SUBCUTANEOUS | 3 refills | Status: DC

## 2023-05-10 NOTE — Progress Notes (Signed)
 05/10/2023, 11:59 AM                                Endocrinology follow-up note   Subjective:    Patient ID: Grace Hess, female    DOB: [birth year].  Grace Hess is being seen in follow-up for management of currently uncontrolled symptomatic diabetes requested by  Elfredia Nevins, MD.   Past Medical History:  Diagnosis Date   Anxiety    CAD (coronary artery disease)    a. s/p DES to RCA in 07/2016 with residual mid-LAD stenosis   Cancer (HCC)    skin cancer on finger,, cancer left kidney   Cervicalgia    Chest pain, unspecified    Chronic kidney disease    Depression    GERD (gastroesophageal reflux disease)    Headache    "bad ones after my stroke in 2016; only have them when I get bad news now" (08/02/2016)   Heart murmur    Heavy smoker (more than 20 cigarettes per day)    Scheduled for LDCT screening 02/11/15   History of hiatal hernia    History of kidney stones    "no cysto/OR" (12/10/2014)   Hypercholesteremia    Hypertension    Obesity    Palpitations    Reflux esophagitis    Stroke [facility])    "they said I had a  MINI stroke a few months back/MRI" (12/10/2014)   TIA (transient ischemic attack) 11/28/2014   [name] 11/28/2014   Type II diabetes mellitus (HCC)     Past Surgical History:  Procedure Laterality Date   ABDOMINAL AORTOGRAM N/A 08/02/2016   Procedure: Abdominal Aortogram;  Surgeon: Yvonne Kendall, MD;  Location: [facility];  Service: Cardiovascular;  Laterality: N/A;   CAROTID STENT     CHOLECYSTECTOMY OPEN  1978   COLONOSCOPY  2010   single diverticulum in sigmoid colon   CORONARY ANGIOPLASTY WITH STENT PLACEMENT  08/02/2016   to RCA   CORONARY STENT INTERVENTION N/A 08/02/2016   Procedure: Coronary Stent Intervention;  Surgeon: Yvonne Kendall, MD;  Location: [facility];  Service: Cardiovascular;  Laterality: N/A;  RCA   CYSTOSCOPY W/  RETROGRADES Bilateral 07/15/2021   Procedure: CYSTOSCOPY WITH RETROGRADE PYELOGRAM;  Surgeon: Milderd Meager., MD;  Location: [facility];  Service: Urology;  Laterality: Bilateral;   CYSTOSCOPY WITH STENT PLACEMENT Left 07/15/2021   Procedure: CYSTOSCOPY WITH STENT PLACEMENT;  Surgeon: Milderd Meager., MD;  Location: [facility];  Service: Urology;  Laterality: Left;   ENDARTERECTOMY Left 12/02/2014   Procedure: ENDARTERECTOMY CAROTID;  Surgeon: Larina Earthly, MD;  Location: [facility] OR;  Service: Vascular;  Laterality: Left;   HERNIA REPAIR     LAPAROSCOPIC NEPHRECTOMY Left    LAPAROSCOPIC NEPHRECTOMY Left 11/25/2021   Procedure: LEFT LAPAROSCOPIC RADICAL NEPHRECTOMY;  Surgeon: Crist Fat, MD;  Location: [facility];  Service: Urology;  Laterality: Left;   LEFT HEART CATH AND CORONARY ANGIOGRAPHY N/A 08/02/2016   Procedure: Left Heart Cath and Coronary Angiography;  Surgeon: Yvonne Kendall,  MD;  Location: [facility];  Service: Cardiovascular;  Laterality: N/A;   LOWER EXTREMITY ANGIOGRAM  08/02/2016   LOWER EXTREMITY ANGIOGRAPHY N/A 08/02/2016   Procedure: Lower Extremity Angiography;  Surgeon: Yvonne Kendall, MD;  Location: [facility];  Service: Cardiovascular;  Laterality: N/A;   NEPHRECTOMY Left    VAGINAL HYSTERECTOMY  1991   "partial"   VENTRAL HERNIA REPAIR N/A 08/05/2022   Procedure: LAPAROSCOPIC VENTRAL WALL HERNIA REPAIR WITH MESH;  Surgeon: Karie Soda, MD;  Location: [facility];  Service: General;  Laterality: N/A;    Social History   Socioeconomic History   Marital status: Married    Spouse name: Not on file   Number of children: 2   Years of education: 12   Highest education level: Not on file  Occupational History   Not on file  Tobacco Use   Smoking status: Every Day    Current packs/day: 1.50    Average packs/day: 1.5 packs/day for 49.0 years (73.5 ttl pk-yrs)    Types: Cigarettes    Start date: 05/17/1974   Smokeless tobacco: Never  Vaping Use    Vaping status: Never Used  Substance and Sexual Activity   Alcohol use: No    Alcohol/week: 0.0 standard drinks of alcohol   Drug use: No   Sexual activity: Not Currently  Other Topics Concern   Not on file  Social History Narrative   Patient does not drink caffeine.   Patient is right handed.    Social Drivers of Corporate investment banker Strain: Low Risk  (05/18/2022)   Received from [facility]   Overall Financial Resource Strain (CARDIA)    Difficulty of Paying Living Expenses: Not hard at all  Food Insecurity: No Food Insecurity (05/18/2022)   Received from [facility]   Hunger Vital Sign    Worried About Running Out of Food in the Last Year: Never true    Ran Out of Food in the Last Year: Never true  Transportation Needs: No Transportation Needs (05/18/2022)   Received from [facility], [facility]   PRAPARE - Transportation    Lack of Transportation (Medical): No    Lack of Transportation (Non-Medical): No  Physical Activity: Not on file  Stress: Not on file  Social Connections: Unknown (04/08/2022)   Received from [facility], [facility]   Social Network    Social Network: Not on file    Family History  Problem Relation Age of Onset   Congestive Heart Failure Mother    COPD Father    Cancer Brother    Cancer Brother    Breast cancer Neg Hx     Outpatient Encounter Medications as of 05/10/2023  Medication Sig   acetaminophen (TYLENOL) 325 MG tablet Take 2 tablets (650 mg total) by mouth every 4 (four) hours as needed for headache or mild pain.   albuterol (VENTOLIN HFA) 108 (90 Base) MCG/ACT inhaler Inhale 1-2 puffs into the lungs every 4 (four) hours as needed for shortness of breath or wheezing.   ALPRAZolam (XANAX) 0.5 MG tablet Take 0.25 mg by mouth 2 (two) times daily.   atorvastatin (LIPITOR) 40 MG tablet Take 1 tablet (40 mg total) by mouth daily.   Budeson-Glycopyrrol-Formoterol (BREZTRI AEROSPHERE) 160-9-4.8  MCG/ACT AERO Inhale into the lungs. 1-2 times per day   calcitRIOL (ROCALTROL) 0.25 MCG capsule Take 0.25 mcg by mouth every Monday, Wednesday, and Friday.   calcium acetate (PHOSLO) 667 MG tablet Take  667 mg by mouth daily.   Cholecalciferol (VITAMIN D3) 125 MCG (5000 UT) CAPS Take 5,000 Units by mouth once a week. Tuesday   clopidogrel (PLAVIX) 75 MG tablet Take 1 tablet (75 mg total) by mouth daily.   escitalopram (LEXAPRO) 20 MG tablet Take 20 mg by mouth daily.   gabapentin (NEURONTIN) 300 MG capsule Take 300 mg by mouth at bedtime.   glucose blood (ACCU-CHEK GUIDE) test strip Use as instructed   LOKELMA 10 g PACK packet Take 10 g by mouth 3 (three) times a week.   nystatin cream (MYCOSTATIN) Apply 1 application  topically 2 (two) times daily as needed (Itching).   ondansetron (ZOFRAN) 4 MG tablet TAKE 1 TABLET(4 MG) BY MOUTH TWICE DAILY AS NEEDED FOR NAUSEA OR VOMITING (Patient taking differently: Take 4 mg by mouth every 8 (eight) hours as needed for nausea.)   oxyCODONE-acetaminophen (PERCOCET/ROXICET) 5-325 MG tablet Take 1 tablet by mouth every 6 (six) hours as needed for severe pain or moderate pain.   pantoprazole (PROTONIX) 40 MG tablet Take 1 tablet (40 mg total) by mouth daily.   promethazine (PHENERGAN) 25 MG tablet Take 25 mg by mouth 4 (four) times daily as needed.   sodium bicarbonate 650 MG tablet Take 650 mg by mouth 2 (two) times daily.   [DISCONTINUED] insulin degludec (TRESIBA FLEXTOUCH) 100 UNIT/ML FlexTouch Pen Inject 10 Units into the skin at bedtime.   [DISCONTINUED] Insulin Pen Needle (PEN NEEDLES) 31G X 6 MM MISC Use to inject insulin once daily   Biotin 5000 MCG CAPS Take 5,000 mcg by mouth daily. (Patient not taking: Reported on 05/09/2023)   Blood Glucose Monitoring Suppl (ACCU-CHEK GUIDE) w/Device KIT 1 Piece by Does not apply route as directed. (Patient not taking: Reported on 05/09/2023)   insulin degludec (TRESIBA FLEXTOUCH) 100 UNIT/ML FlexTouch Pen Inject 10  Units into the skin at bedtime.   Insulin Pen Needle (PEN NEEDLES) 31G X 6 MM MISC Use to inject insulin once daily   [DISCONTINUED] rosuvastatin (CRESTOR) 20 MG tablet TAKE 1 TABLET(20 MG) BY MOUTH DAILY   No facility-administered encounter medications on file as of 05/10/2023.    ALLERGIES: Allergies  Allergen Reactions   Codeine Shortness Of Breath   Latex Itching   Sulfa Antibiotics Other (See Comments)    Reaction:  Unknown     VACCINATION STATUS: Immunization History  Administered Date(s) Administered   Influenza,inj,Quad PF,6+ Mos 11/29/2014   Pneumococcal Polysaccharide-23 11/29/2014    Diabetes She presents for her follow-up diabetic visit. She has type 2 diabetes mellitus. Onset time: She was diagnosed at approximate age of 50 years. Her disease course has been worsening. There are no hypoglycemic associated symptoms. Pertinent negatives for hypoglycemia include no confusion, headaches, pallor or seizures. Associated symptoms include fatigue and foot paresthesias. Pertinent negatives for diabetes include no chest pain, no polydipsia, no polyphagia, no polyuria and no weight loss. There are no hypoglycemic complications. Symptoms are stable. Diabetic complications include a CVA, heart disease and nephropathy. Risk factors for coronary artery disease include dyslipidemia, diabetes mellitus, family history, hypertension, tobacco exposure, sedentary lifestyle and post-menopausal. Current diabetic treatment includes diet and insulin injections. She is compliant with treatment most of the time. Her weight is decreasing steadily. She is following a generally healthy diet. When asked about meal planning, she reported none. She has not had a previous visit with a dietitian. She never participates in exercise. (She presents today with no meter or logs.  She notes she has not been  checking glucose since November.  She did get the Dexcom but did not like it, prefers to fingerstick instead.  Her  POCT A1c today is 14.6%, increasing drastically from last visit of 7.7%, however with her having CKD with anemia, this result can be skewed and thus not fully represent her control of diabetes.  She does admit she has not been taking her insulin routinely either.) An ACE inhibitor/angiotensin II receptor blocker is being taken. She does not see a podiatrist.Eye exam is not current.  Hyperlipidemia This is a chronic problem. The current episode started more than 1 year ago. The problem is controlled. Recent lipid tests were reviewed and are normal. Exacerbating diseases include chronic renal disease and diabetes. Factors aggravating her hyperlipidemia include beta blockers, fatty foods and smoking. Pertinent negatives include no chest pain, myalgias or shortness of breath. Current antihyperlipidemic treatment includes statins. The current treatment provides mild improvement of lipids. Compliance problems include adherence to diet and adherence to exercise.  Risk factors for coronary artery disease include diabetes mellitus, dyslipidemia, hypertension, a sedentary lifestyle, post-menopausal and family history.  Hypertension This is a chronic problem. The current episode started more than 1 year ago. The problem has been resolved since onset. The problem is controlled. Pertinent negatives include no chest pain, headaches, palpitations or shortness of breath. There are no associated agents to hypertension. Risk factors for coronary artery disease include dyslipidemia, diabetes mellitus, sedentary lifestyle, smoking/tobacco exposure and post-menopausal state. Past treatments include ACE inhibitors and beta blockers. The current treatment provides moderate improvement. Compliance problems include exercise and diet.  Hypertensive end-organ damage includes kidney disease, CAD/MI and CVA. Identifiable causes of hypertension include chronic renal disease.    Review of systems  Constitutional: + steadily decreasing  body weight,  current Body mass index is 22.8 kg/m. , + fatigue, no subjective hyperthermia, no subjective hypothermia Eyes: no blurry vision, no xerophthalmia ENT: no sore throat, no nodules palpated in throat, no dysphagia/odynophagia, no hoarseness Cardiovascular: no chest pain, no shortness of breath, no palpitations, no leg swelling Respiratory: no cough, no shortness of breath Gastrointestinal: no nausea/vomiting/diarrhea Musculoskeletal: no muscle/joint aches Skin: no rashes, no hyperemia Neurological: no tremors, no dizziness, + numbness/tingling to BLE (feet) Psychiatric: no depression, no anxiety  Objective:    BP 116/66 (BP Location: Left Arm, Patient Position: Sitting, Cuff Size: Large)   Pulse 97   Ht 5\' 5"  (1.651 m)   Wt 137 lb (62.1 kg)   BMI 22.80 kg/m   Wt Readings from Last 3 Encounters:  05/10/23 137 lb (62.1 kg)  05/09/23 140 lb (63.5 kg)  01/28/23 142 lb (64.4 kg)    BP Readings from Last 3 Encounters:  05/10/23 116/66  05/09/23 124/68  05/03/23 (!) 150/70      Physical Exam- Limited  Constitutional:  Body mass index is 22.8 kg/m. , not in acute distress, normal state of mind Eyes:  EOMI, no exophthalmos Musculoskeletal: no gross deformities, strength intact in all four extremities, no gross restriction of joint movements Skin:  no rashes, no hyperemia Neurological: no tremor with outstretched hands  Diabetic Foot Exam - Simple   No data filed    CMP     Component Value Date/Time   NA 134 (L) 01/10/2023 1505   NA 141 09/28/2021 0822   K 4.8 01/10/2023 1505   CL 104 01/10/2023 1505   CO2 22 01/10/2023 1505   GLUCOSE 260 (H) 01/10/2023 1505   BUN 63 (H) 01/10/2023 1505   BUN 30 (  H) 09/28/2021 0822   CREATININE 3.20 (H) 01/10/2023 1505   CREATININE 1.38 (H) 08/20/2019 0719   CALCIUM 8.7 (L) 01/10/2023 1505   PROT 6.8 01/10/2023 1505   PROT 6.6 09/28/2021 0822   ALBUMIN 3.7 01/10/2023 1505   ALBUMIN 4.2 09/28/2021 0822   AST 13 (L)  01/10/2023 1505   ALT 11 01/10/2023 1505   ALKPHOS 56 01/10/2023 1505   BILITOT 0.2 (L) 01/10/2023 1505   BILITOT 0.3 09/28/2021 0822   GFRNONAA 15 (L) 01/10/2023 1505   GFRNONAA 40 (L) 08/20/2019 0719   GFRAA 51 (L) 03/26/2020 0927   GFRAA 47 (L) 08/20/2019 0719     Diabetic Labs (most recent): Lab Results  Component Value Date   HGBA1C 14.6 (A) 05/10/2023   HGBA1C 7.7 (A) 01/28/2023   HGBA1C 9.1 (A) 10/26/2022   MICROALBUR 26.7 08/20/2019    Lipid Panel     Component Value Date/Time   CHOL 161 03/30/2021 0803   TRIG 212 (H) 03/30/2021 0803   HDL 35 (L) 03/30/2021 0803   CHOLHDL 4.6 (H) 03/30/2021 0803   CHOLHDL 3.6 01/16/2019 0937   VLDL UNABLE TO CALCULATE IF TRIGLYCERIDE OVER 400 mg/dL 16/12/9602 5409   LDLCALC 90 03/30/2021 0803   LDLCALC 69 01/16/2019 0937   LABVLDL 36 03/30/2021 0803       Assessment & Plan:   1) DM type 2 causing vascular disease (HCC)  - ROSHELL BRIGHAM has currently uncontrolled symptomatic type 2 DM since  68 years of age.  She presents today with no meter or logs.  She notes she has not been checking glucose since November.  She did get the Dexcom but did not like it, prefers to fingerstick instead.  Her POCT A1c today is 14.6%, increasing drastically from last visit of 7.7%, however with her having CKD with anemia, this result can be skewed and thus not fully represent her control of diabetes.  She does admit she has not been taking her insulin routinely either.  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  -Recent labs reviewed showing worsening kidney function- will refer to nephrology for further evaluation and treatment.  - I had a long discussion with her about the progressive nature of diabetes and the pathology behind its complications. -her diabetes is complicated by coronary artery disease, CVA, nephropathy, peripheral neuropathy, chronic heavy smoking, sedentary life and she remains at  a high risk for more acute and chronic complications which include CAD, CVA, CKD, retinopathy, and neuropathy. These are all discussed in detail with her.  - Nutritional counseling repeated at each appointment due to patients tendency to fall back in to old habits.  - The patient admits there is a room for improvement in their diet and drink choices. -  Suggestion is made for the patient to avoid simple carbohydrates from their diet including Cakes, Sweet Desserts / Pastries, Ice Cream, Soda (diet and regular), Sweet Tea, Candies, Chips, Cookies, Sweet Pastries, Store Bought Juices, Alcohol in Excess of 1-2 drinks a day, Artificial Sweeteners, Coffee Creamer, and "Sugar-free" Products. This will help patient to have stable blood glucose profile and potentially avoid unintended weight gain.   - I encouraged the patient to switch to unprocessed or minimally processed complex starch and increased protein intake (animal or plant source), fruits, and vegetables.   - Patient is advised to stick to a routine mealtimes to eat 3 meals a day and avoid unnecessary snacks (to snack only to correct  hypoglycemia).  - I have approached her with the following individualized plan to manage  her diabetes and patient agrees:   -She is advised to restart her Tresiba 10 units SQ nightly and restart checking glucose twice daily.  I asked her to send me readings in 2 weeks so we can adjust her insulin if needed.    She is advised to restart monitoring glucose twice daily, before breakfast and before bed, and to call the clinic if she has readings less than 70 or above 200 for 3 tests in a row. She did not care for the [facility].  She is not a candidate for Metformin due to kidney disease, she does not meet criteria for GLP1 use with BMI less than 25.  She was on Glipizide in the past but had severe hypoglycemia.  - Specific targets for  A1c;  LDL, HDL, Triglycerides, and  Waist Circumference were discussed with the  patient.  2) Blood Pressure /Hypertension:  Her blood pressure is tight, recently taken off all BP meds.    3) Lipids/Hyperlipidemia:   Her most recent lipid panel from 03/30/21 shows controlled LDL of 90 and elevated triglycerides of 212.  She is advised to continue Crestor 20 mg po daily at bedtime.  Side effects and precautions discussed with her.  She is advised to avoid fried foods and butter.    4)  Weight/Diet:  Her Body mass index is 22.8 kg/m..  she is not a candidate for more weight loss.   Exercise, and detailed carbohydrates information provided  -  detailed on discharge instructions.  5) Chronic Care/Health Maintenance: -she is on ACEI/ARB and Statin medications and is encouraged to initiate and continue to follow up with Ophthalmology, Dentist, Podiatrist at least yearly or according to recommendations, and advised to quit smoking. I have recommended yearly flu vaccine and pneumonia vaccine at least every 5 years; moderate intensity exercise for up to 150 minutes weekly; and sleep for at least 7 hours a day.  - she is advised to maintain close follow up with Elfredia Nevins, MD for primary care needs (will be calling him about her fatigue to have iron checked), as well as her other providers for optimal and coordinated care (also reaching out to surgeon about the mass that has appeared.        I spent  24  minutes in the care of the patient today including review of labs from CMP, Lipids, Thyroid Function, Hematology (current and previous including abstractions from other facilities); face-to-face time discussing  her blood glucose readings/logs, discussing hypoglycemia and hyperglycemia episodes and symptoms, medications doses, her options of short and long term treatment based on the latest standards of care / guidelines;  discussion about incorporating lifestyle medicine;  and documenting the encounter. Risk reduction counseling performed per USPSTF guidelines to reduce obesity  and cardiovascular risk factors.     Please refer to Patient Instructions for Blood Glucose Monitoring and Insulin/Medications Dosing Guide"  in media tab for additional information. Please  also refer to " Patient Self Inventory" in the Media  tab for reviewed elements of pertinent patient history.  Boykin Nearing participated in the discussions, expressed understanding, and voiced agreement with the above plans.  All questions were answered to her satisfaction. she is encouraged to contact clinic should she have any questions or concerns prior to her return visit.   Follow up plan: - Return in about 3 months (around 08/07/2023) for Diabetes F/U with A1c in office, No  previsit labs, Bring meter and logs.  Ronny Bacon, [facility] [address] [place], [place] [address] Phone: [phone] Fax: [phone]  05/10/2023, 11:59 AM

## 2023-05-11 ENCOUNTER — Emergency Department (HOSPITAL_COMMUNITY)
Admission: EM | Admit: 2023-05-11 | Discharge: 2023-05-11 | Disposition: A | Discharge status: Home / Self Care | Payer: PPO | Attending: Student | Admitting: Student

## 2023-05-11 ENCOUNTER — Encounter (HOSPITAL_COMMUNITY): Payer: Self-pay

## 2023-05-11 ENCOUNTER — Encounter: Payer: Self-pay | Admitting: Hematology

## 2023-05-11 ENCOUNTER — Other Ambulatory Visit: Payer: Self-pay

## 2023-05-11 ENCOUNTER — Telehealth: Payer: Self-pay | Admitting: Nurse Practitioner

## 2023-05-11 ENCOUNTER — Telehealth (HOSPITAL_COMMUNITY): Payer: Self-pay | Admitting: Pharmacy Technician

## 2023-05-11 ENCOUNTER — Other Ambulatory Visit (HOSPITAL_COMMUNITY): Payer: Self-pay

## 2023-05-11 ENCOUNTER — Telehealth (HOSPITAL_COMMUNITY): Payer: Self-pay | Admitting: Emergency Medicine

## 2023-05-11 DIAGNOSIS — I2510 Atherosclerotic heart disease of native coronary artery without angina pectoris: Secondary | ICD-10-CM | POA: Diagnosis not present

## 2023-05-11 DIAGNOSIS — F17210 Nicotine dependence, cigarettes, uncomplicated: Secondary | ICD-10-CM | POA: Insufficient documentation

## 2023-05-11 DIAGNOSIS — E1122 Type 2 diabetes mellitus with diabetic chronic kidney disease: Secondary | ICD-10-CM | POA: Diagnosis not present

## 2023-05-11 DIAGNOSIS — Z794 Long term (current) use of insulin: Secondary | ICD-10-CM | POA: Diagnosis not present

## 2023-05-11 DIAGNOSIS — Z91040 Latex allergy status: Secondary | ICD-10-CM | POA: Diagnosis not present

## 2023-05-11 DIAGNOSIS — R739 Hyperglycemia, unspecified: Secondary | ICD-10-CM | POA: Diagnosis present

## 2023-05-11 DIAGNOSIS — N189 Chronic kidney disease, unspecified: Secondary | ICD-10-CM | POA: Insufficient documentation

## 2023-05-11 DIAGNOSIS — E1165 Type 2 diabetes mellitus with hyperglycemia: Secondary | ICD-10-CM | POA: Insufficient documentation

## 2023-05-11 DIAGNOSIS — Z7901 Long term (current) use of anticoagulants: Secondary | ICD-10-CM | POA: Insufficient documentation

## 2023-05-11 DIAGNOSIS — I129 Hypertensive chronic kidney disease with stage 1 through stage 4 chronic kidney disease, or unspecified chronic kidney disease: Secondary | ICD-10-CM | POA: Insufficient documentation

## 2023-05-11 DIAGNOSIS — Z85828 Personal history of other malignant neoplasm of skin: Secondary | ICD-10-CM | POA: Diagnosis not present

## 2023-05-11 DIAGNOSIS — Z8673 Personal history of transient ischemic attack (TIA), and cerebral infarction without residual deficits: Secondary | ICD-10-CM | POA: Insufficient documentation

## 2023-05-11 LAB — CBC
HCT: 35.8 % — ABNORMAL LOW (ref 36.0–46.0)
Hemoglobin: 12 g/dL (ref 12.0–15.0)
MCH: 30.5 pg (ref 26.0–34.0)
MCHC: 33.5 g/dL (ref 30.0–36.0)
MCV: 90.9 fL (ref 80.0–100.0)
Platelets: 147 10*3/uL — ABNORMAL LOW (ref 150–400)
RBC: 3.94 MIL/uL (ref 3.87–5.11)
RDW: 13.6 % (ref 11.5–15.5)
WBC: 7.2 10*3/uL (ref 4.0–10.5)
nRBC: 0 % (ref 0.0–0.2)

## 2023-05-11 LAB — COMPREHENSIVE METABOLIC PANEL
ALT: 17 U/L (ref 0–44)
AST: 18 U/L (ref 15–41)
Albumin: 3.4 g/dL — ABNORMAL LOW (ref 3.5–5.0)
Alkaline Phosphatase: 65 U/L (ref 38–126)
Anion gap: 14 (ref 5–15)
BUN: 47 mg/dL — ABNORMAL HIGH (ref 8–23)
CO2: 21 mmol/L — ABNORMAL LOW (ref 22–32)
Calcium: 8.4 mg/dL — ABNORMAL LOW (ref 8.9–10.3)
Chloride: 92 mmol/L — ABNORMAL LOW (ref 98–111)
Creatinine, Ser: 3.14 mg/dL — ABNORMAL HIGH (ref 0.44–1.00)
GFR, Estimated: 16 mL/min — ABNORMAL LOW (ref >60)
Glucose, Bld: 528 mg/dL (ref 70–99)
Potassium: 4.2 mmol/L (ref 3.5–5.1)
Sodium: 127 mmol/L — ABNORMAL LOW (ref 135–145)
Total Bilirubin: 0.6 mg/dL (ref 0.0–1.2)
Total Protein: 6.6 g/dL (ref 6.5–8.1)

## 2023-05-11 LAB — CBG MONITORING, ED
Glucose-Capillary: 493 mg/dL — ABNORMAL HIGH (ref 70–99)
Glucose-Capillary: 319 mg/dL — ABNORMAL HIGH (ref 70–99)

## 2023-05-11 LAB — BETA-HYDROXYBUTYRIC ACID: Beta-Hydroxybutyric Acid: 0.1 mmol/L (ref 0.05–0.27)

## 2023-05-11 MED ORDER — LACTATED RINGERS IV BOLUS
1000.0000 mL | Freq: Once | INTRAVENOUS | Status: AC
  Administered 2023-05-11: 1000 mL via INTRAVENOUS

## 2023-05-11 MED ORDER — INSULIN ASPART 100 UNIT/ML IJ SOLN
10.0000 [IU] | Freq: Once | INTRAMUSCULAR | Status: AC
  Administered 2023-05-11: 10 [IU] via SUBCUTANEOUS

## 2023-05-11 MED ORDER — INSULIN ASPART 100 UNIT/ML IJ SOLN
20.0000 [IU] | Freq: Once | INTRAMUSCULAR | Status: DC
  Filled 2023-05-11: qty 1

## 2023-05-11 MED ORDER — NOVOLOG FLEXPEN 100 UNIT/ML ~~LOC~~ SOPN
0.0000 [IU] | PEN_INJECTOR | Freq: Three times a day (TID) | SUBCUTANEOUS | 0 refills | Status: DC
Start: 2023-05-11 — End: 2023-05-11

## 2023-05-11 MED ORDER — NOVOLOG FLEXPEN 100 UNIT/ML ~~LOC~~ SOPN
0.0000 [IU] | PEN_INJECTOR | Freq: Three times a day (TID) | SUBCUTANEOUS | Status: DC
Start: 2023-05-11 — End: 2023-05-12

## 2023-05-11 MED ORDER — POTASSIUM CHLORIDE CRYS ER 20 MEQ PO TBCR
20.0000 meq | EXTENDED_RELEASE_TABLET | Freq: Once | ORAL | Status: DC
  Filled 2023-05-11: qty 1

## 2023-05-11 NOTE — Telephone Encounter (Signed)
 Have her reach out with readings tomorrow and we can make adjustments that way.

## 2023-05-11 NOTE — Telephone Encounter (Signed)
 Patient said her sugar is 600. She is asking if she should inject insulin

## 2023-05-11 NOTE — Inpatient Diabetes Management (Signed)
 Inpatient Diabetes Program Recommendations  AACE/ADA: New Consensus Statement on Inpatient Glycemic Control   Target Ranges:  Prepandial:   less than 140 mg/dL      Peak postprandial:   less than 180 mg/dL (1-2 hours)      Critically ill patients:  140 - 180 mg/dL    Latest Reference Range & Units 05/11/23 10:58  CO2 22 - 32 mmol/L 21 (L)  Glucose 70 - 99 mg/dL 409 (HH)  Anion gap 5 - 15  14    Latest Reference Range & Units 05/11/23 10:23 05/11/23 12:22  Glucose-Capillary 70 - 99 mg/dL 811 (H)   Novolog 10 units    Latest Reference Range & Units 05/10/23 11:48  Hemoglobin A1C 4.0 - 5.6 % 14.6 !   Review of Glycemic Control  Diabetes history: DM2 Outpatient Diabetes medications: Tresiba 10 units QHS Current orders for Inpatient glycemic control: None; in ED  Inpatient Diabetes Program Recommendations:    Outpatient DM: At time of discharge, please consider increasing outpatient dose of Tresiba to 15 units at bedtime and provide Rx for Novolog pens (order [phone]) along with correction scale of 0-9 units TID.  Please use correction scale of 0-9 units TID CBG 70-120      0 units CBG 121-150    1 unit CBG 151-200    2 units CBG 201-250    3 units CBG 251-300    5 units CBG 301-350    7 units CBG 351-400    9 units CBG over 400 - Call Endocrinology to report high glucose  NOTE: In reviewing chart, noted patient seen Percell Boston, NP on 05/10/23 and per office note, patient was not checking glucose or taking insulin routinely and A1C was 14.6%. Patient was advised to resume taking Tresiba 10 units at bedtime and check glucose at least BID.   Spoke with patient and her husband at bedside about diabetes and home regimen for diabetes control. Patient reports being followed by [facility] Endocrinology and was seen by Percell Boston, NP on 05/10/23 for diabetes management.  Patient reports she restarted taking Tresiba 10 units at bedtime last night and that when she woke up this morning, her  glucose was over HI on her glucometer. Patient states she has been having symptoms of hyperglycemia as well.  Discussed that if she took the [place] 10 units last night and glucose was HI this morning, she will likely need to increase the dose of Tresiba to improve glucose.  Discussed how Evaristo Bury works and discussed possibly using short acting insulin with a correction scale. Discussed Novolog insulin and how it works; discussed how to use correction scale as well.  Patient states she would be willing to use Novolog correction scale if prescribed. Patient states she has been doing finger sticks but she has Dexcom CGM sensors at home. Encouraged patient to consider using Dexcom CGM so it would obtain more data for Endocrinology to use to make adjustments with insulin regimen.  Discussed importance of checking CBGs and maintaining good CBG control to prevent long-term and short-term complications. Explained how hyperglycemia leads to damage within blood vessels which lead to the common complications seen with uncontrolled diabetes. Stressed to the patient the importance of improving glycemic control to prevent further complications from uncontrolled diabetes.  Patient states she does not see Endocrinology for 3 more months. Encouraged patient to call Endocrinology office back in a few days to let them know how her glucose is trending so she can be advised if  she needs to adjust insulin dosages.  Discussed insulin injection sites and importance of rotating injection sites.  Patient verbalized understanding of information discussed and reports no further questions at this time related to diabetes.  Thanks, Orlando Penner, RN, MSN, CDE Diabetes Coordinator Inpatient Diabetes Program [phone] (Team Pager)

## 2023-05-11 NOTE — Telephone Encounter (Signed)
 Insulin NovoLog Floxapen needs to be resent.

## 2023-05-11 NOTE — Telephone Encounter (Signed)
 Called pt and let her know to take readings tonight and tomorrow and to call Friday morning with readings unless readings were super high or low tomorrow.  Pt understood.

## 2023-05-11 NOTE — Telephone Encounter (Signed)
 Patient Product/process development scientist completed.    The patient is insured through [employer]/ [employer]. Patient has Medicare and is not eligible for a copay card, but may be able to apply for patient assistance or Medicare RX Payment Plan (Patient Must reach out to their plan, if eligible for payment plan), if available.    Ran test claim for Novolog Pen and the current 30 day co-pay is $0.00.   This test claim was processed through [facility]- copay amounts may vary at other pharmacies due to pharmacy/plan contracts, or as the patient moves through the different stages of their insurance plan.     Roland Earl, CPHT Pharmacy Technician III Certified Patient Advocate [facility] Patient Advocate Team Direct Number: [phone]  Fax: [phone]

## 2023-05-11 NOTE — Telephone Encounter (Signed)
 Oh goodness!  Sounds like she may need to go to the ED for some IV fluids and so they can give her some short acting insulin and monitor that it comes down to a safe range.  I restarted her insulin regimen when she was here, but it wont work that fast.

## 2023-05-11 NOTE — ED Provider Notes (Signed)
 [place] EMERGENCY DEPARTMENT AT [facility] Provider Note  CSN: [id number] Arrival date & time: 05/11/23 1005  Chief Complaint(s) Hyperglycemia  HPI Grace Hess is a 68 y.o. female with PMH CAD status post DES placement, T2DM on Tresiba, GERD, HTN, HLD, TIA, CKD who presents Emergency Department for evaluation of hyperglycemia.  States that she has not entirely been compliant with her insulin use at home and over the last few days has had polyuria and polydipsia.  Blood sugars greater than 600 at home and she spoke with her endocrinologist who recommended ER presentation.  Here in the emergency room, patient is overall well-appearing with no complaints of nausea, vomiting, chest pain, shortness of breath, abdominal pain or other systemic symptoms.   Past Medical History Past Medical History:  Diagnosis Date   Anxiety    CAD (coronary artery disease)    a. s/p DES to RCA in 07/2016 with residual mid-LAD stenosis   Cancer (HCC)    skin cancer on finger,, cancer left kidney   Cervicalgia    Chest pain, unspecified    Chronic kidney disease    Depression    GERD (gastroesophageal reflux disease)    Headache    "bad ones after my stroke in 2016; only have them when I get bad news now" (08/02/2016)   Heart murmur    Heavy smoker (more than 20 cigarettes per day)    Scheduled for LDCT screening 02/11/15   History of hiatal hernia    History of kidney stones    "no cysto/OR" (12/10/2014)   Hypercholesteremia    Hypertension    Obesity    Palpitations    Reflux esophagitis    Stroke [facility])    "they said I had a  MINI stroke a few months back/MRI" (12/10/2014)   TIA (transient ischemic attack) 11/28/2014   [name] 11/28/2014   Type II diabetes mellitus (HCC)    Patient Active Problem List   Diagnosis Date Noted   Iron deficiency anemia 01/18/2023   Diarrhea 11/02/2022   Incarcerated incisional hernia 08/05/2022   Renal cell adenocarcinoma (HCC) 11/25/2021   Renal  cell cancer, left (HCC) 10/01/2021   Left renal mass    Acute renal failure superimposed on stage 3b chronic kidney disease (HCC) 07/14/2021   Perianal abscess 03/30/2021   Vitamin D deficiency 08/23/2019   Loss of weight 07/17/2019   PAD (peripheral artery disease) (HCC) 07/04/2019   IBS (irritable bowel syndrome) 01/16/2019   Uncontrolled type 2 diabetes mellitus with hyperglycemia, without long-term current use of insulin (HCC) 01/08/2019   Type 2 diabetes mellitus without complication (HCC) 12/21/2018   Lumbar back pain 12/21/2018   Mixed hyperlipidemia 12/21/2018   CAD S/P percutaneous coronary angioplasty 08/03/2016   Claudication (HCC) 08/02/2016   Abnormal stress test 08/02/2016   Stable angina (HCC) 08/02/2016   Heavy smoker (more than 20 cigarettes per day)    Headache 12/10/2014   Carotid artery disease (HCC) 12/01/2014   TIA (transient ischemic attack) 11/28/2014   Tobacco use disorder 11/28/2014   HYPERCHOLESTEROLEMIA  IIA 09/13/2008   Depression with anxiety 09/13/2008   Essential hypertension, benign 09/13/2008   GERD (gastroesophageal reflux disease) 09/13/2008   Chest pain with high risk for cardiac etiology 09/13/2008   Home Medication(s) Prior to Admission medications   Medication Sig Start Date End Date Taking? Authorizing Provider  acetaminophen (TYLENOL) 325 MG tablet Take 2 tablets (650 mg total) by mouth every 4 (four) hours as needed for headache or mild  pain. 08/03/16   Abelino Derrick, PA-C  albuterol (VENTOLIN HFA) 108 (90 Base) MCG/ACT inhaler Inhale 1-2 puffs into the lungs every 4 (four) hours as needed for shortness of breath or wheezing.    [provider]  ALPRAZolam Prudy Feeler) 0.5 MG tablet Take 0.25 mg by mouth 2 (two) times daily. 03/20/19   [provider]  atorvastatin (LIPITOR) 40 MG tablet Take 1 tablet (40 mg total) by mouth daily. 05/09/23 08/07/23  Laurann Montana, PA-C  Biotin 5000 MCG CAPS Take 5,000 mcg by mouth daily. Patient  not taking: Reported on 05/09/2023    [provider]  Blood Glucose Monitoring Suppl (ACCU-CHEK GUIDE) w/Device KIT 1 Piece by Does not apply route as directed. Patient not taking: Reported on 05/09/2023 08/23/19   Roma Kayser, MD  Budeson-Glycopyrrol-Formoterol (BREZTRI AEROSPHERE) 160-9-4.8 MCG/ACT AERO Inhale into the lungs. 1-2 times per day    [provider]  calcitRIOL (ROCALTROL) 0.25 MCG capsule Take 0.25 mcg by mouth every Monday, Wednesday, and Friday. 06/14/22 06/14/23  [provider]  calcium acetate (PHOSLO) 667 MG tablet Take 667 mg by mouth daily. 06/14/22 06/14/23  [provider]  Cholecalciferol (VITAMIN D3) 125 MCG (5000 UT) CAPS Take 5,000 Units by mouth once a week. Tuesday    [provider]  clopidogrel (PLAVIX) 75 MG tablet Take 1 tablet (75 mg total) by mouth daily. 12/03/14   Rai, Ripudeep K, MD  escitalopram (LEXAPRO) 20 MG tablet Take 20 mg by mouth daily. 01/01/21   [provider]  gabapentin (NEURONTIN) 300 MG capsule Take 300 mg by mouth at bedtime.    [provider]  glucose blood (ACCU-CHEK GUIDE) test strip Use as instructed 08/23/19   Roma Kayser, MD  insulin degludec (TRESIBA FLEXTOUCH) 100 UNIT/ML FlexTouch Pen Inject 10 Units into the skin at bedtime. 05/10/23   Dani Gobble, NP  Insulin Pen Needle (PEN NEEDLES) 31G X 6 MM MISC Use to inject insulin once daily 05/10/23   Dani Gobble, NP  LOKELMA 10 g PACK packet Take 10 g by mouth 3 (three) times a week. 07/14/22   [provider]  nystatin cream (MYCOSTATIN) Apply 1 application  topically 2 (two) times daily as needed (Itching). 07/10/16   [provider]  ondansetron (ZOFRAN) 4 MG tablet TAKE 1 TABLET(4 MG) BY MOUTH TWICE DAILY AS NEEDED FOR NAUSEA OR VOMITING Patient taking differently: Take 4 mg by mouth every 8 (eight) hours as needed for nausea. 10/29/21   Dolores Frame, MD   oxyCODONE-acetaminophen (PERCOCET/ROXICET) 5-325 MG tablet Take 1 tablet by mouth every 6 (six) hours as needed for severe pain or moderate pain. 08/05/22   Karie Soda, MD  pantoprazole (PROTONIX) 40 MG tablet Take 1 tablet (40 mg total) by mouth daily. 11/02/22   Carlan, Jeral Pinch, NP  promethazine (PHENERGAN) 25 MG tablet Take 25 mg by mouth 4 (four) times daily as needed. 12/17/21   [provider]  sodium bicarbonate 650 MG tablet Take 650 mg by mouth 2 (two) times daily. 06/14/22   [provider]  Past Surgical History Past Surgical History:  Procedure Laterality Date   ABDOMINAL AORTOGRAM N/A 08/02/2016   Procedure: Abdominal Aortogram;  Surgeon: Yvonne Kendall, MD;  Location: [facility] INVASIVE [facility];  Service: Cardiovascular;  Laterality: N/A;   CAROTID STENT     CHOLECYSTECTOMY OPEN  1978   COLONOSCOPY  2010   single diverticulum in sigmoid colon   CORONARY ANGIOPLASTY WITH STENT PLACEMENT  08/02/2016   to RCA   CORONARY STENT INTERVENTION N/A 08/02/2016   Procedure: Coronary Stent Intervention;  Surgeon: Yvonne Kendall, MD;  Location: [facility] INVASIVE CV LAB;  Service: Cardiovascular;  Laterality: N/A;  RCA   CYSTOSCOPY W/ RETROGRADES Bilateral 07/15/2021   Procedure: CYSTOSCOPY WITH RETROGRADE PYELOGRAM;  Surgeon: Milderd Meager., MD;  Location: [facility] ORS;  Service: Urology;  Laterality: Bilateral;   CYSTOSCOPY WITH STENT PLACEMENT Left 07/15/2021   Procedure: CYSTOSCOPY WITH STENT PLACEMENT;  Surgeon: Milderd Meager., MD;  Location: [facility] ORS;  Service: Urology;  Laterality: Left;   ENDARTERECTOMY Left 12/02/2014   Procedure: ENDARTERECTOMY CAROTID;  Surgeon: Larina Earthly, MD;  Location: [facility] OR;  Service: Vascular;  Laterality: Left;   HERNIA REPAIR     LAPAROSCOPIC NEPHRECTOMY Left    LAPAROSCOPIC NEPHRECTOMY Left 11/25/2021    Procedure: LEFT LAPAROSCOPIC RADICAL NEPHRECTOMY;  Surgeon: Crist Fat, MD;  Location: [facility] ORS;  Service: Urology;  Laterality: Left;   LEFT HEART CATH AND CORONARY ANGIOGRAPHY N/A 08/02/2016   Procedure: Left Heart Cath and Coronary Angiography;  Surgeon: Yvonne Kendall, MD;  Location: [facility] IN[facility];  Service: Cardiovascular;  Laterality: N/A;   LOWER EXTREMITY ANGIOGRAM  08/02/2016   LOWER EXTREMITY ANGIOGRAPHY N/A 08/02/2016   Procedure: Lower Extremity Angiography;  Surgeon: Yvonne Kendall, MD;  Location: [facility];  Service: Cardiovascular;  Laterality: N/A;   NEPHRECTOMY Left    VAGINAL HYSTERECTOMY  1991   "partial"   VENTRAL HERNIA REPAIR N/A 08/05/2022   Procedure: LAPAROSCOPIC VENTRAL WALL HERNIA REPAIR WITH MESH;  Surgeon: Karie Soda, MD;  Location: [facility] ORS;  Service: General;  Laterality: N/A;   Family History Family History  Problem Relation Age of Onset   Congestive Heart Failure Mother    COPD Father    Cancer Brother    Cancer Brother    Breast cancer Neg Hx     Social History Social History   Tobacco Use   Smoking status: Every Day    Current packs/day: 1.50    Average packs/day: 1.5 packs/day for 49.0 years (73.5 ttl pk-yrs)    Types: Cigarettes    Start date: 05/17/1974   Smokeless tobacco: Never  Vaping Use   Vaping status: Never Used  Substance Use Topics   Alcohol use: No    Alcohol/week: 0.0 standard drinks of alcohol   Drug use: No   Allergies Codeine, Latex, and Sulfa antibiotics  Review of Systems Review of Systems  Endocrine: Positive for polydipsia and polyuria.    Physical Exam Vital Signs  I have reviewed the triage vital signs BP 110/66 (BP Location: Right Arm)   Pulse 81   Temp 99 F (37.2 C) (Temporal)   Resp 16   Ht 5\' 5"  (1.651 m)   Wt 62.1 kg   SpO2 97%   BMI 22.80 kg/m   Physical Exam Vitals and nursing note reviewed.  Constitutional:      General: She is not in acute distress.     Appearance: She is well-developed.  HENT:     Head: Normocephalic and  atraumatic.  Eyes:     Conjunctiva/sclera: Conjunctivae normal.  Cardiovascular:     Rate and Rhythm: Normal rate and regular rhythm.     Heart sounds: No murmur heard. Pulmonary:     Effort: Pulmonary effort is normal. No respiratory distress.     Breath sounds: Normal breath sounds.  Abdominal:     Palpations: Abdomen is soft.     Tenderness: There is no abdominal tenderness.  Musculoskeletal:        General: No swelling.     Cervical back: Neck supple.  Skin:    General: Skin is warm and dry.     Capillary Refill: Capillary refill takes less than 2 seconds.  Neurological:     Mental Status: She is alert.  Psychiatric:        Mood and Affect: Mood normal.     ED Results and Treatments Labs (all labs ordered are listed, but only abnormal results are displayed) Labs Reviewed  CBC - Abnormal; Notable for the following components:      Result Value   HCT 35.8 (*)    Platelets 147 (*)    All other components within normal limits  CBG MONITORING, ED - Abnormal; Notable for the following components:   Glucose-Capillary 493 (*)    All other components within normal limits  URINALYSIS, ROUTINE W REFLEX MICROSCOPIC  COMPREHENSIVE METABOLIC PANEL  BETA-HYDROXYBUTYRIC ACID                                                                                                                          Radiology No results found.  Pertinent labs & imaging results that were available during my care of the patient were reviewed by me and considered in my medical decision making (see MDM for details).  Medications Ordered in ED Medications  lactated ringers bolus 1,000 mL (has no administration in time range)                                                                                                                                     Procedures .Critical Care  Performed by: Glendora Score, MD Authorized by:  Glendora Score, MD   Critical care provider statement:    Critical care time (minutes):  30   Critical care was necessary to treat or prevent imminent or life-threatening deterioration of the following conditions:  Endocrine crisis   Critical care  was time spent personally by me on the following activities:  Development of treatment plan with patient or surrogate, discussions with consultants, evaluation of patient's response to treatment, examination of patient, ordering and review of laboratory studies, ordering and review of radiographic studies, ordering and performing treatments and interventions, pulse oximetry, re-evaluation of patient's condition and review of old charts   (including critical care time)  Medical Decision Making / ED Course   This patient presents to the ED for concern of hyperglycemia, this involves an extensive number of treatment options, and is a complaint that carries with it a high risk of complications and morbidity.  The differential diagnosis includes DKA, HHS, stress hyperglycemia, medication noncompliance  MDM: Patient seen and department for evaluation of hypoglycemia.  Physical exam is largely unremarkable.  Laboratory evaluation concerning with a initial glucose of 528, pseudohyponatremia to 127, BUN 47, creatinine 3.14 which is near patient's baseline.  Beta-hydroxybutyrate is normal, anion gap is normal.  Patient received fluids and 10 units of subcu insulin and on reevaluation her glucose has significant improved to 319.  Patient was seen by the inpatient diabetes coordinator who is recommending sending a prescription for NovoLog pen sliding scale use of which was sent to the pharmacy.  At this time she does not meet inpatient criteria for admission and will be discharged with outpatient endocrinology follow-up.  Patient discharged with strict return precautions of which she voiced understanding.   Additional history obtained: -Additional history obtained  from husband -External records from outside source obtained and reviewed including: Chart review including previous notes, labs, imaging, consultation notes   Lab Tests: -I ordered, reviewed, and interpreted labs.   The pertinent results include:   Labs Reviewed  CBC - Abnormal; Notable for the following components:      Result Value   HCT 35.8 (*)    Platelets 147 (*)    All other components within normal limits  CBG MONITORING, ED - Abnormal; Notable for the following components:   Glucose-Capillary 493 (*)    All other components within normal limits  URINALYSIS, ROUTINE W REFLEX MICROSCOPIC  COMPREHENSIVE METABOLIC PANEL  BETA-HYDROXYBUTYRIC ACID       Medicines ordered and prescription drug management: Meds ordered this encounter  Medications   lactated ringers bolus 1,000 mL    -I have reviewed the patients home medicines and have made adjustments as needed  Critical interventions Fluids, insulin    Cardiac Monitoring: The patient was maintained on a cardiac monitor.  I personally viewed and interpreted the cardiac monitored which showed an underlying rhythm of: NSR  Social Determinants of Health:  Factors impacting patients care include: Endorses medication noncompliance, we did have a discussion on the importance of insulin compliance   Reevaluation: After the interventions noted above, I reevaluated the patient and found that they have :improved  Co morbidities that complicate the patient evaluation  Past Medical History:  Diagnosis Date   Anxiety    CAD (coronary artery disease)    a. s/p DES to RCA in 07/2016 with residual mid-LAD stenosis   Cancer (HCC)    skin cancer on finger,, cancer left kidney   Cervicalgia    Chest pain, unspecified    Chronic kidney disease    Depression    GERD (gastroesophageal reflux disease)    Headache    "bad ones after my stroke in 2016; only have them when I get bad news now" (08/02/2016)   Heart murmur    Heavy  smoker (more  than 20 cigarettes per day)    Scheduled for LDCT screening 02/11/15   History of hiatal hernia    History of kidney stones    "no cysto/OR" (12/10/2014)   Hypercholesteremia    Hypertension    Obesity    Palpitations    Reflux esophagitis    Stroke [facility])    "they said I had a  MINI stroke a few months back/MRI" (12/10/2014)   TIA (transient ischemic attack) 11/28/2014   [name] 11/28/2014   Type II diabetes mellitus (HCC)       Dispostion: I considered admission for this patient, but at this time she does not meet inpatient criteria for admission and will be discharged with outpatient follow-up     Final Clinical Impression(s) / ED Diagnoses Final diagnoses:  None     @PCDICTATION @    Glendora Score, MD 05/12/23 [phone]

## 2023-05-11 NOTE — ED Triage Notes (Signed)
 Pt arrived from home via POV c/o > 600 CBG reading at home. Pt endorses excessive thirst and calling her PCP office and being advised to seek treatment in the ER.

## 2023-05-11 NOTE — ED Notes (Signed)
 Pharmacy stating they still did not get the script and may be a issues on their side. Charge nurse requested information for a verbal order.05/11/2023 1843hrs.

## 2023-05-11 NOTE — Telephone Encounter (Signed)
 Pt states ER discharge papers want her to be seen ASAP.  When do you want to see her?

## 2023-05-11 NOTE — ED Notes (Signed)
 Nurse attempted to call pharmacy with verbal but they are closed. 05/11/2023 1900hrs.

## 2023-05-11 NOTE — Telephone Encounter (Signed)
Patient notified to go to ER

## 2023-05-11 NOTE — ED Notes (Signed)
 Pt requested her script be sent to her pharmacy. EDP aware and attempt to send it. Charge nurse called pharmacy to verify they received script.                                                                  05/11/2023   1830hrs

## 2023-05-12 MED ORDER — NOVOLOG FLEXPEN 100 UNIT/ML ~~LOC~~ SOPN: 5.0000 [IU] | PEN_INJECTOR | Freq: Three times a day (TID) | SUBCUTANEOUS | 3 refills | Status: DC

## 2023-05-12 NOTE — Telephone Encounter (Signed)
 Patient called and said that last night before supper was 209, she injected 15 units of [place]. This morning it is 214. She has not had insulin yet. She asked you call in her novolog

## 2023-05-12 NOTE — Telephone Encounter (Signed)
 I sent in for Novolog 5 units TID with meals if glucose is above 90 and she is eating to the pharmacy.  Glucose is certainly better than those 600 readings!

## 2023-05-12 NOTE — Telephone Encounter (Signed)
 Pt.notified

## 2023-05-12 NOTE — Addendum Note (Signed)
 Addended by: Dani Gobble on: 05/12/2023 08:49 AM   Modules accepted: Orders

## 2023-05-16 ENCOUNTER — Telehealth: Payer: Self-pay | Admitting: *Deleted

## 2023-05-16 NOTE — Telephone Encounter (Signed)
 Patient still has not been able to get her sugar under 214 since Thursday. Please Advise.

## 2023-05-16 NOTE — Telephone Encounter (Signed)
 Patient left a message that her blood sugars are not coming down. The lowest it has been was 214 on this past Thursday. She feels extremely tired , just out of it.

## 2023-05-16 NOTE — Telephone Encounter (Signed)
 Can you get some glucose readings for me so I can know how to adjust her medications?

## 2023-05-16 NOTE — Telephone Encounter (Signed)
 Per you reviewing her readings the patient was called and made aware.

## 2023-05-16 NOTE — Telephone Encounter (Signed)
 Pt called with high BG readings.   Date Before breakfast Before lunch Before supper Bedtime  05/12/23 214  350 5 units Novolog 392 5 units of Novolog   05/13/23  245 5 units of Novolog 263  - no insulin   05/14/2023 223 - No insulin  423- Novolog 5 units   05/15/23 282- not sure if she gave insulin  386- 5 units of  Novolog Tresiba 15 units  05/16/23               341 - 4 units of Novolog  Pt taking: Patient shared that she is not hungry and that she is not eating much. She thinks that on the Tresiba pack it says 15 units at bedtime. She has only taken it on 05/15/2023. THe Novolog she has been injecting the 5 units except this morning 4 units. When she has missed the Novolog , patient has felt that the sugars are okay.

## 2023-05-16 NOTE — Telephone Encounter (Signed)
 It sounds like she needs a bit more.  I would advise her ton increase Tresiba to 20 units for now so that morning readings are a bit closer to goal.  Will hold off on advancing her Novolog just yet.  Small changes at a time.

## 2023-05-16 NOTE — Telephone Encounter (Signed)
 Patient was called and made aware.

## 2023-05-17 DIAGNOSIS — Z6824 Body mass index (BMI) 24.0-24.9, adult: Secondary | ICD-10-CM | POA: Diagnosis not present

## 2023-05-17 DIAGNOSIS — I10 Essential (primary) hypertension: Secondary | ICD-10-CM | POA: Diagnosis not present

## 2023-05-17 DIAGNOSIS — M1991 Primary osteoarthritis, unspecified site: Secondary | ICD-10-CM | POA: Diagnosis not present

## 2023-05-17 DIAGNOSIS — G894 Chronic pain syndrome: Secondary | ICD-10-CM | POA: Diagnosis not present

## 2023-05-17 DIAGNOSIS — N184 Chronic kidney disease, stage 4 (severe): Secondary | ICD-10-CM | POA: Diagnosis not present

## 2023-05-17 DIAGNOSIS — I129 Hypertensive chronic kidney disease with stage 1 through stage 4 chronic kidney disease, or unspecified chronic kidney disease: Secondary | ICD-10-CM | POA: Diagnosis not present

## 2023-05-17 DIAGNOSIS — N2581 Secondary hyperparathyroidism of renal origin: Secondary | ICD-10-CM | POA: Diagnosis not present

## 2023-05-17 DIAGNOSIS — E1122 Type 2 diabetes mellitus with diabetic chronic kidney disease: Secondary | ICD-10-CM | POA: Diagnosis not present

## 2023-05-17 DIAGNOSIS — F411 Generalized anxiety disorder: Secondary | ICD-10-CM | POA: Diagnosis not present

## 2023-05-17 DIAGNOSIS — J209 Acute bronchitis, unspecified: Secondary | ICD-10-CM | POA: Diagnosis not present

## 2023-05-17 DIAGNOSIS — I739 Peripheral vascular disease, unspecified: Secondary | ICD-10-CM | POA: Diagnosis not present

## 2023-05-17 DIAGNOSIS — J441 Chronic obstructive pulmonary disease with (acute) exacerbation: Secondary | ICD-10-CM | POA: Diagnosis not present

## 2023-05-19 ENCOUNTER — Ambulatory Visit (HOSPITAL_COMMUNITY)
Admission: RE | Admit: 2023-05-19 | Discharge: 2023-05-19 | Disposition: A | Discharge status: Home / Self Care | Source: Ambulatory Visit | Attending: Internal Medicine | Admitting: Internal Medicine

## 2023-05-19 ENCOUNTER — Other Ambulatory Visit (HOSPITAL_COMMUNITY): Payer: Self-pay | Admitting: Internal Medicine

## 2023-05-19 DIAGNOSIS — J101 Influenza due to other identified influenza virus with other respiratory manifestations: Secondary | ICD-10-CM | POA: Diagnosis not present

## 2023-05-19 DIAGNOSIS — R6520 Severe sepsis without septic shock: Secondary | ICD-10-CM | POA: Diagnosis not present

## 2023-05-19 DIAGNOSIS — I700 Atherosclerosis of aorta: Secondary | ICD-10-CM | POA: Diagnosis not present

## 2023-05-19 DIAGNOSIS — R0602 Shortness of breath: Secondary | ICD-10-CM | POA: Diagnosis not present

## 2023-05-19 DIAGNOSIS — R059 Cough, unspecified: Secondary | ICD-10-CM

## 2023-05-19 DIAGNOSIS — A419 Sepsis, unspecified organism: Secondary | ICD-10-CM | POA: Diagnosis not present

## 2023-05-19 DIAGNOSIS — R0989 Other specified symptoms and signs involving the circulatory and respiratory systems: Secondary | ICD-10-CM | POA: Diagnosis not present

## 2023-05-19 DIAGNOSIS — J90 Pleural effusion, not elsewhere classified: Secondary | ICD-10-CM | POA: Diagnosis not present

## 2023-05-19 DIAGNOSIS — J9601 Acute respiratory failure with hypoxia: Secondary | ICD-10-CM | POA: Diagnosis not present

## 2023-05-19 DIAGNOSIS — J1000 Influenza due to other identified influenza virus with unspecified type of pneumonia: Secondary | ICD-10-CM | POA: Diagnosis not present

## 2023-05-20 ENCOUNTER — Other Ambulatory Visit: Payer: Self-pay

## 2023-05-20 ENCOUNTER — Encounter (HOSPITAL_COMMUNITY): Payer: Self-pay | Admitting: Emergency Medicine

## 2023-05-20 ENCOUNTER — Inpatient Hospital Stay (HOSPITAL_COMMUNITY)
Admission: EM | Admit: 2023-05-20 | Discharge: 2023-05-22 | DRG: 193 | Disposition: A | Discharge status: Home / Self Care | Attending: Family Medicine | Admitting: Family Medicine

## 2023-05-20 ENCOUNTER — Telehealth: Payer: Self-pay | Admitting: *Deleted

## 2023-05-20 ENCOUNTER — Emergency Department (HOSPITAL_COMMUNITY)

## 2023-05-20 DIAGNOSIS — E1151 Type 2 diabetes mellitus with diabetic peripheral angiopathy without gangrene: Secondary | ICD-10-CM | POA: Diagnosis present

## 2023-05-20 DIAGNOSIS — E1165 Type 2 diabetes mellitus with hyperglycemia: Secondary | ICD-10-CM | POA: Diagnosis not present

## 2023-05-20 DIAGNOSIS — E669 Obesity, unspecified: Secondary | ICD-10-CM | POA: Diagnosis present

## 2023-05-20 DIAGNOSIS — K219 Gastro-esophageal reflux disease without esophagitis: Secondary | ICD-10-CM | POA: Diagnosis present

## 2023-05-20 DIAGNOSIS — I10 Essential (primary) hypertension: Secondary | ICD-10-CM | POA: Diagnosis present

## 2023-05-20 DIAGNOSIS — Z9981 Dependence on supplemental oxygen: Secondary | ICD-10-CM | POA: Diagnosis not present

## 2023-05-20 DIAGNOSIS — Z9049 Acquired absence of other specified parts of digestive tract: Secondary | ICD-10-CM

## 2023-05-20 DIAGNOSIS — F17210 Nicotine dependence, cigarettes, uncomplicated: Secondary | ICD-10-CM | POA: Diagnosis not present

## 2023-05-20 DIAGNOSIS — F418 Other specified anxiety disorders: Secondary | ICD-10-CM | POA: Diagnosis present

## 2023-05-20 DIAGNOSIS — D509 Iron deficiency anemia, unspecified: Secondary | ICD-10-CM | POA: Diagnosis not present

## 2023-05-20 DIAGNOSIS — Z1152 Encounter for screening for COVID-19: Secondary | ICD-10-CM | POA: Diagnosis not present

## 2023-05-20 DIAGNOSIS — E1122 Type 2 diabetes mellitus with diabetic chronic kidney disease: Secondary | ICD-10-CM | POA: Diagnosis not present

## 2023-05-20 DIAGNOSIS — F32A Depression, unspecified: Secondary | ICD-10-CM | POA: Diagnosis not present

## 2023-05-20 DIAGNOSIS — R059 Cough, unspecified: Secondary | ICD-10-CM | POA: Diagnosis not present

## 2023-05-20 DIAGNOSIS — J101 Influenza due to other identified influenza virus with other respiratory manifestations: Secondary | ICD-10-CM | POA: Diagnosis not present

## 2023-05-20 DIAGNOSIS — Z825 Family history of asthma and other chronic lower respiratory diseases: Secondary | ICD-10-CM

## 2023-05-20 DIAGNOSIS — J1000 Influenza due to other identified influenza virus with unspecified type of pneumonia: Principal | ICD-10-CM | POA: Diagnosis present

## 2023-05-20 DIAGNOSIS — Z885 Allergy status to narcotic agent status: Secondary | ICD-10-CM

## 2023-05-20 DIAGNOSIS — I2510 Atherosclerotic heart disease of native coronary artery without angina pectoris: Secondary | ICD-10-CM | POA: Diagnosis not present

## 2023-05-20 DIAGNOSIS — I779 Disorder of arteries and arterioles, unspecified: Secondary | ICD-10-CM | POA: Diagnosis present

## 2023-05-20 DIAGNOSIS — A419 Sepsis, unspecified organism: Secondary | ICD-10-CM | POA: Diagnosis not present

## 2023-05-20 DIAGNOSIS — Z91040 Latex allergy status: Secondary | ICD-10-CM

## 2023-05-20 DIAGNOSIS — I700 Atherosclerosis of aorta: Secondary | ICD-10-CM | POA: Diagnosis not present

## 2023-05-20 DIAGNOSIS — Z7902 Long term (current) use of antithrombotics/antiplatelets: Secondary | ICD-10-CM | POA: Diagnosis not present

## 2023-05-20 DIAGNOSIS — J90 Pleural effusion, not elsewhere classified: Secondary | ICD-10-CM | POA: Diagnosis not present

## 2023-05-20 DIAGNOSIS — J9601 Acute respiratory failure with hypoxia: Secondary | ICD-10-CM | POA: Diagnosis present

## 2023-05-20 DIAGNOSIS — F419 Anxiety disorder, unspecified: Secondary | ICD-10-CM | POA: Diagnosis present

## 2023-05-20 DIAGNOSIS — J9621 Acute and chronic respiratory failure with hypoxia: Secondary | ICD-10-CM | POA: Diagnosis present

## 2023-05-20 DIAGNOSIS — I129 Hypertensive chronic kidney disease with stage 1 through stage 4 chronic kidney disease, or unspecified chronic kidney disease: Secondary | ICD-10-CM | POA: Diagnosis not present

## 2023-05-20 DIAGNOSIS — Z79899 Other long term (current) drug therapy: Secondary | ICD-10-CM

## 2023-05-20 DIAGNOSIS — Z955 Presence of coronary angioplasty implant and graft: Secondary | ICD-10-CM

## 2023-05-20 DIAGNOSIS — Z882 Allergy status to sulfonamides status: Secondary | ICD-10-CM

## 2023-05-20 DIAGNOSIS — Z85828 Personal history of other malignant neoplasm of skin: Secondary | ICD-10-CM | POA: Diagnosis not present

## 2023-05-20 DIAGNOSIS — N1832 Chronic kidney disease, stage 3b: Secondary | ICD-10-CM | POA: Diagnosis present

## 2023-05-20 DIAGNOSIS — J440 Chronic obstructive pulmonary disease with acute lower respiratory infection: Secondary | ICD-10-CM | POA: Diagnosis present

## 2023-05-20 DIAGNOSIS — Z85528 Personal history of other malignant neoplasm of kidney: Secondary | ICD-10-CM

## 2023-05-20 DIAGNOSIS — Z8249 Family history of ischemic heart disease and other diseases of the circulatory system: Secondary | ICD-10-CM

## 2023-05-20 DIAGNOSIS — Z794 Long term (current) use of insulin: Secondary | ICD-10-CM

## 2023-05-20 DIAGNOSIS — J189 Pneumonia, unspecified organism: Secondary | ICD-10-CM

## 2023-05-20 DIAGNOSIS — Z8673 Personal history of transient ischemic attack (TIA), and cerebral infarction without residual deficits: Secondary | ICD-10-CM

## 2023-05-20 DIAGNOSIS — J441 Chronic obstructive pulmonary disease with (acute) exacerbation: Secondary | ICD-10-CM | POA: Diagnosis not present

## 2023-05-20 DIAGNOSIS — C642 Malignant neoplasm of left kidney, except renal pelvis: Secondary | ICD-10-CM | POA: Diagnosis present

## 2023-05-20 DIAGNOSIS — Z87442 Personal history of urinary calculi: Secondary | ICD-10-CM

## 2023-05-20 DIAGNOSIS — R6520 Severe sepsis without septic shock: Secondary | ICD-10-CM | POA: Diagnosis not present

## 2023-05-20 DIAGNOSIS — Z905 Acquired absence of kidney: Secondary | ICD-10-CM

## 2023-05-20 DIAGNOSIS — E782 Mixed hyperlipidemia: Secondary | ICD-10-CM | POA: Diagnosis present

## 2023-05-20 DIAGNOSIS — R0602 Shortness of breath: Secondary | ICD-10-CM | POA: Diagnosis not present

## 2023-05-20 DIAGNOSIS — Z90711 Acquired absence of uterus with remaining cervical stump: Secondary | ICD-10-CM

## 2023-05-20 DIAGNOSIS — J09X1 Influenza due to identified novel influenza A virus with pneumonia: Secondary | ICD-10-CM | POA: Diagnosis present

## 2023-05-20 LAB — CBC
HCT: 35.6 % — ABNORMAL LOW (ref 36.0–46.0)
Hemoglobin: 11.3 g/dL — ABNORMAL LOW (ref 12.0–15.0)
MCH: 29.4 pg (ref 26.0–34.0)
MCHC: 31.7 g/dL (ref 30.0–36.0)
MCV: 92.7 fL (ref 80.0–100.0)
Platelets: 378 10*3/uL (ref 150–400)
RBC: 3.84 MIL/uL — ABNORMAL LOW (ref 3.87–5.11)
RDW: 13.5 % (ref 11.5–15.5)
WBC: 14.3 10*3/uL — ABNORMAL HIGH (ref 4.0–10.5)
nRBC: 0 % (ref 0.0–0.2)

## 2023-05-20 LAB — BRAIN NATRIURETIC PEPTIDE: B Natriuretic Peptide: 1003 pg/mL — ABNORMAL HIGH (ref 0.0–100.0)

## 2023-05-20 LAB — TROPONIN I (HIGH SENSITIVITY)
Troponin I (High Sensitivity): 26 ng/L — ABNORMAL HIGH (ref <18)
Troponin I (High Sensitivity): 23 ng/L — ABNORMAL HIGH (ref <18)

## 2023-05-20 LAB — GLUCOSE, CAPILLARY: Glucose-Capillary: 73 mg/dL (ref 70–99)

## 2023-05-20 LAB — COMPREHENSIVE METABOLIC PANEL
ALT: 16 U/L (ref 0–44)
AST: 22 U/L (ref 15–41)
Albumin: 2.8 g/dL — ABNORMAL LOW (ref 3.5–5.0)
Alkaline Phosphatase: 83 U/L (ref 38–126)
Anion gap: 11 (ref 5–15)
BUN: 66 mg/dL — ABNORMAL HIGH (ref 8–23)
CO2: 24 mmol/L (ref 22–32)
Calcium: 9.1 mg/dL (ref 8.9–10.3)
Chloride: 100 mmol/L (ref 98–111)
Creatinine, Ser: 2.91 mg/dL — ABNORMAL HIGH (ref 0.44–1.00)
GFR, Estimated: 17 mL/min — ABNORMAL LOW (ref >60)
Glucose, Bld: 121 mg/dL — ABNORMAL HIGH (ref 70–99)
Potassium: 3.8 mmol/L (ref 3.5–5.1)
Sodium: 135 mmol/L (ref 135–145)
Total Bilirubin: 0.4 mg/dL (ref 0.0–1.2)
Total Protein: 6.6 g/dL (ref 6.5–8.1)

## 2023-05-20 LAB — LACTIC ACID, PLASMA
Lactic Acid, Venous: 0.7 mmol/L (ref 0.5–1.9)
Lactic Acid, Venous: 0.6 mmol/L (ref 0.5–1.9)

## 2023-05-20 LAB — RESP PANEL BY RT-PCR (RSV, FLU A&B, COVID)  RVPGX2
Influenza A by PCR: POSITIVE — AB
Influenza B by PCR: NEGATIVE
Resp Syncytial Virus by PCR: NEGATIVE
SARS Coronavirus 2 by RT PCR: NEGATIVE

## 2023-05-20 LAB — CBG MONITORING, ED
Glucose-Capillary: 69 mg/dL — ABNORMAL LOW (ref 70–99)
Glucose-Capillary: 155 mg/dL — ABNORMAL HIGH (ref 70–99)

## 2023-05-20 LAB — PROCALCITONIN: Procalcitonin: 0.31 ng/mL

## 2023-05-20 LAB — APTT: aPTT: 31 s (ref 24–36)

## 2023-05-20 LAB — PROTIME-INR
INR: 1 (ref 0.8–1.2)
Prothrombin Time: 13.7 s (ref 11.4–15.2)

## 2023-05-20 MED ORDER — LACTATED RINGERS IV SOLN: INTRAVENOUS | Status: DC

## 2023-05-20 MED ORDER — CALCITRIOL 0.25 MCG PO CAPS: 0.2500 ug | ORAL_CAPSULE | ORAL | Status: DC

## 2023-05-20 MED ORDER — SODIUM BICARBONATE 650 MG PO TABS
650.0000 mg | ORAL_TABLET | Freq: Two times a day (BID) | ORAL | Status: DC
  Administered 2023-05-20 – 2023-05-22 (×4): 650 mg via ORAL
  Filled 2023-05-20 (×4): qty 1

## 2023-05-20 MED ORDER — IPRATROPIUM-ALBUTEROL 0.5-2.5 (3) MG/3ML IN SOLN
3.0000 mL | Freq: Once | RESPIRATORY_TRACT | Status: AC
  Administered 2023-05-20: 3 mL via RESPIRATORY_TRACT
  Filled 2023-05-20: qty 3

## 2023-05-20 MED ORDER — ONDANSETRON HCL 4 MG PO TABS: 4.0000 mg | ORAL_TABLET | Freq: Four times a day (QID) | ORAL | Status: DC | PRN

## 2023-05-20 MED ORDER — SODIUM ZIRCONIUM CYCLOSILICATE 10 G PO PACK: 10.0000 g | PACK | ORAL | Status: DC

## 2023-05-20 MED ORDER — ACETAMINOPHEN 650 MG RE SUPP: 650.0000 mg | Freq: Four times a day (QID) | RECTAL | Status: DC | PRN

## 2023-05-20 MED ORDER — INSULIN GLARGINE 100 UNIT/ML ~~LOC~~ SOLN
10.0000 [IU] | Freq: Every day | SUBCUTANEOUS | Status: DC
  Filled 2023-05-20 (×2): qty 0.1

## 2023-05-20 MED ORDER — SODIUM CHLORIDE 0.9 % IV SOLN
500.0000 mg | Freq: Once | INTRAVENOUS | Status: AC
  Administered 2023-05-20: 500 mg via INTRAVENOUS
  Filled 2023-05-20: qty 5

## 2023-05-20 MED ORDER — SODIUM CHLORIDE 0.9 % IV SOLN
1.0000 g | Freq: Once | INTRAVENOUS | Status: AC
  Administered 2023-05-20: 1 g via INTRAVENOUS
  Filled 2023-05-20: qty 10

## 2023-05-20 MED ORDER — OSELTAMIVIR PHOSPHATE 30 MG PO CAPS
30.0000 mg | ORAL_CAPSULE | ORAL | Status: DC
Start: 2023-05-20 — End: 2023-05-22
  Administered 2023-05-20: 30 mg via ORAL
  Filled 2023-05-20 (×2): qty 1

## 2023-05-20 MED ORDER — ALPRAZOLAM 0.25 MG PO TABS
0.2500 mg | ORAL_TABLET | Freq: Two times a day (BID) | ORAL | Status: DC
  Administered 2023-05-20 – 2023-05-22 (×4): 0.25 mg via ORAL
  Filled 2023-05-20 (×4): qty 1

## 2023-05-20 MED ORDER — CLOPIDOGREL BISULFATE 75 MG PO TABS
75.0000 mg | ORAL_TABLET | Freq: Every day | ORAL | Status: DC
  Administered 2023-05-21: 75 mg via ORAL
  Filled 2023-05-20: qty 1

## 2023-05-20 MED ORDER — PREDNISONE 20 MG PO TABS: 40.0000 mg | ORAL_TABLET | Freq: Every day | ORAL | Status: DC

## 2023-05-20 MED ORDER — BUDESON-GLYCOPYRROL-FORMOTEROL 160-9-4.8 MCG/ACT IN AERO: 1.0000 | INHALATION_SPRAY | Freq: Every day | RESPIRATORY_TRACT | Status: DC

## 2023-05-20 MED ORDER — MOMETASONE FURO-FORMOTEROL FUM 200-5 MCG/ACT IN AERO
2.0000 | INHALATION_SPRAY | Freq: Two times a day (BID) | RESPIRATORY_TRACT | Status: DC
  Administered 2023-05-21 – 2023-05-22 (×3): 2 via RESPIRATORY_TRACT
  Filled 2023-05-20: qty 8.8

## 2023-05-20 MED ORDER — ENOXAPARIN SODIUM 30 MG/0.3ML IJ SOSY
30.0000 mg | PREFILLED_SYRINGE | INTRAMUSCULAR | Status: DC
  Administered 2023-05-20 – 2023-05-21 (×2): 30 mg via SUBCUTANEOUS
  Filled 2023-05-20 (×2): qty 0.3

## 2023-05-20 MED ORDER — ACETAMINOPHEN 325 MG PO TABS
650.0000 mg | ORAL_TABLET | Freq: Four times a day (QID) | ORAL | Status: DC | PRN
  Administered 2023-05-20: 650 mg via ORAL
  Filled 2023-05-20: qty 2

## 2023-05-20 MED ORDER — SODIUM CHLORIDE 0.9 % IV SOLN
2.0000 g | INTRAVENOUS | Status: DC
  Administered 2023-05-21: 2 g via INTRAVENOUS
  Filled 2023-05-20: qty 20

## 2023-05-20 MED ORDER — IPRATROPIUM-ALBUTEROL 0.5-2.5 (3) MG/3ML IN SOLN
3.0000 mL | Freq: Four times a day (QID) | RESPIRATORY_TRACT | Status: DC
  Administered 2023-05-20 – 2023-05-21 (×5): 3 mL via RESPIRATORY_TRACT
  Filled 2023-05-20 (×5): qty 3

## 2023-05-20 MED ORDER — PANTOPRAZOLE SODIUM 40 MG PO TBEC
40.0000 mg | DELAYED_RELEASE_TABLET | Freq: Every day | ORAL | Status: DC
  Administered 2023-05-21 – 2023-05-22 (×2): 40 mg via ORAL
  Filled 2023-05-20 (×2): qty 1

## 2023-05-20 MED ORDER — ONDANSETRON HCL 4 MG/2ML IJ SOLN: 4.0000 mg | Freq: Four times a day (QID) | INTRAMUSCULAR | Status: DC | PRN

## 2023-05-20 MED ORDER — UMECLIDINIUM BROMIDE 62.5 MCG/ACT IN AEPB
1.0000 | INHALATION_SPRAY | Freq: Every day | RESPIRATORY_TRACT | Status: DC
  Administered 2023-05-21 – 2023-05-22 (×2): 1 via RESPIRATORY_TRACT
  Filled 2023-05-20: qty 7

## 2023-05-20 MED ORDER — SODIUM CHLORIDE 0.9 % IV SOLN: 500.0000 mg | INTRAVENOUS | Status: DC

## 2023-05-20 MED ORDER — INSULIN ASPART 100 UNIT/ML IJ SOLN: 0.0000 [IU] | Freq: Three times a day (TID) | INTRAMUSCULAR | Status: DC

## 2023-05-20 MED ORDER — INSULIN ASPART 100 UNIT/ML FLEXPEN: 5.0000 [IU] | PEN_INJECTOR | Freq: Three times a day (TID) | SUBCUTANEOUS | Status: DC

## 2023-05-20 MED ORDER — INSULIN ASPART 100 UNIT/ML IJ SOLN: 0.0000 [IU] | Freq: Every day | INTRAMUSCULAR | Status: DC

## 2023-05-20 MED ORDER — METHYLPREDNISOLONE SODIUM SUCC 125 MG IJ SOLR
60.0000 mg | Freq: Two times a day (BID) | INTRAMUSCULAR | Status: AC
  Administered 2023-05-20 – 2023-05-21 (×2): 60 mg via INTRAVENOUS
  Filled 2023-05-20 (×2): qty 2

## 2023-05-20 NOTE — ED Triage Notes (Signed)
 Pt reports with complaints of sob and coughing. Spouse states pts blood sugar read high on the glucose monitor. Pt is 86%on room air. CBG is 69 in triage

## 2023-05-20 NOTE — H&P (Signed)
 History and Physical    Patient: Grace Hess XBJ:[id number] DOB: [birth year] DOA: 05/20/2023 DOS: the patient was seen and examined on 05/20/2023 PCP: Elfredia Nevins, MD  Patient coming from: Home  Chief Complaint:  Chief Complaint  Patient presents with   Shortness of Breath   HPI: Grace Hess is a 68 y.o. female with medical history significant of renal cell carcinoma, coronary artery disease with stent placed in 2018, TIAs, GERD, depression, hypertension, COPD, type 2 diabetes.  Patient seen with increasing shortness of breath over the last week with purulent sputum production she saw her PCP who had her do symptomatic treatments.  She presented here due to increasing shortness of breath with dyspnea on exertion. No fevers, but decreased appetite.  Review of Systems: As mentioned in the history of present illness. All other systems reviewed and are negative. Past Medical History:  Diagnosis Date   Anxiety    CAD (coronary artery disease)    a. s/p DES to RCA in 07/2016 with residual mid-LAD stenosis   Cancer (HCC)    skin cancer on finger,, cancer left kidney   Cervicalgia    Chest pain, unspecified    Chronic kidney disease    Depression    GERD (gastroesophageal reflux disease)    Headache    "bad ones after my stroke in 2016; only have them when I get bad news now" (08/02/2016)   Heart murmur    Heavy smoker (more than 20 cigarettes per day)    Scheduled for LDCT screening 02/11/15   History of hiatal hernia    History of kidney stones    "no cysto/OR" (12/10/2014)   Hypercholesteremia    Hypertension    Obesity    Palpitations    Reflux esophagitis    Stroke [facility])    "they said I had a  MINI stroke a few months back/MRI" (12/10/2014)   TIA (transient ischemic attack) 11/28/2014   Grace Hess 11/28/2014   Type II diabetes mellitus (HCC)    Past Surgical History:  Procedure Laterality Date   ABDOMINAL AORTOGRAM N/A 08/02/2016   Procedure: Abdominal Aortogram;   Surgeon: Yvonne Kendall, MD;  Location: [facility];  Service: Cardiovascular;  Laterality: N/A;   CAROTID STENT     CHOLECYSTECTOMY OPEN  1978   COLONOSCOPY  2010   single diverticulum in sigmoid colon   CORONARY ANGIOPLASTY WITH STENT PLACEMENT  08/02/2016   to RCA   CORONARY STENT INTERVENTION N/A 08/02/2016   Procedure: Coronary Stent Intervention;  Surgeon: Yvonne Kendall, MD;  Location: [facility];  Service: Cardiovascular;  Laterality: N/A;  RCA   CYSTOSCOPY W/ RETROGRADES Bilateral 07/15/2021   Procedure: CYSTOSCOPY WITH RETROGRADE PYELOGRAM;  Surgeon: Milderd Meager., MD;  Location: [facility];  Service: Urology;  Laterality: Bilateral;   CYSTOSCOPY WITH STENT PLACEMENT Left 07/15/2021   Procedure: CYSTOSCOPY WITH STENT PLACEMENT;  Surgeon: Milderd Meager., MD;  Location: [facility];  Service: Urology;  Laterality: Left;   ENDARTERECTOMY Left 12/02/2014   Procedure: ENDARTERECTOMY CAROTID;  Surgeon: Larina Earthly, MD;  Location[facility];  Service: Vascular;  Laterality: Left;   HERNIA REPAIR     LAPAROSCOPIC NEPHRECTOMY Left    LAPAROSCOPIC NEPHRECTOMY Left 11/25/2021   Procedure: LEFT LAPAROSCOPIC RADICAL NEPHRECTOMY;  Surgeon: Crist Fat, MD;  Location: [facility] ORS;  Service: Urology;  Laterality: Left;   LEFT HEART CATH AND CORONARY ANGIOGRAPHY N/A 08/02/2016   Procedure: Left Heart Cath and Coronary Angiography;  Surgeon: End,  Cristal Deer, MD;  Location: [facility];  Service: Cardiovascular;  Laterality: N/A;   LOWER EXTREMITY ANGIOGRAM  08/02/2016   LOWER EXTREMITY ANGIOGRAPHY N/A 08/02/2016   Procedure: Lower Extremity Angiography;  Surgeon: Yvonne Kendall, MD;  Location: [facility];  Service: Cardiovascular;  Laterality: N/A;   NEPHRECTOMY Left    VAGINAL HYSTERECTOMY  1991   "partial"   VENTRAL HERNIA REPAIR N/A 08/05/2022   Procedure: LAPAROSCOPIC VENTRAL WALL HERNIA REPAIR WITH MESH;  Surgeon: Karie Soda, MD;  Location: [facility]  ORS;  Service: General;  Laterality: N/A;   Social History:  reports that she has been smoking cigarettes. She started smoking about 49 years ago. She has a 73.5 pack-year smoking history. She has never used smokeless tobacco. She reports that she does not drink alcohol and does not use drugs.  Allergies  Allergen Reactions   Codeine Shortness Of Breath   Latex Itching   Sulfa Antibiotics Other (See Comments)    Reaction:  Unknown     Family History  Problem Relation Age of Onset   Congestive Heart Failure Mother    COPD Father    Cancer Brother    Cancer Brother    Breast cancer Neg Hx     Prior to Admission medications   Medication Sig Start Date End Date Taking? Authorizing Provider  acetaminophen (TYLENOL) 325 MG tablet Take 2 tablets (650 mg total) by mouth every 4 (four) hours as needed for headache or mild pain. 08/03/16   Abelino Derrick, PA-C  albuterol (VENTOLIN HFA) 108 (90 Base) MCG/ACT inhaler Inhale 1-2 puffs into the lungs every 4 (four) hours as needed for shortness of breath or wheezing.    [provider]  ALPRAZolam Prudy Feeler) 0.5 MG tablet Take 0.25 mg by mouth 2 (two) times daily. 03/20/19   [provider]  atorvastatin (LIPITOR) 40 MG tablet Take 1 tablet (40 mg total) by mouth daily. 05/09/23 08/07/23  Laurann Montana, PA-C  Biotin 5000 MCG CAPS Take 5,000 mcg by mouth daily. Patient not taking: Reported on 05/09/2023    [provider]  Blood Glucose Monitoring Suppl (ACCU-CHEK GUIDE) w/Device KIT 1 Piece by Does not apply route as directed. Patient not taking: Reported on 05/09/2023 08/23/19   Roma Kayser, MD  Budeson-Glycopyrrol-Formoterol (BREZTRI AEROSPHERE) 160-9-4.8 MCG/ACT AERO Inhale into the lungs. 1-2 times per day    [provider]  calcitRIOL (ROCALTROL) 0.25 MCG capsule Take 0.25 mcg by mouth every Monday, Wednesday, and Friday. 06/14/22 06/14/23  [provider]  calcium acetate (PHOSLO) 667 MG tablet Take  667 mg by mouth daily. 06/14/22 06/14/23  [provider]  Cholecalciferol (VITAMIN D3) 125 MCG (5000 UT) CAPS Take 5,000 Units by mouth once a week. Tuesday    [provider]  clopidogrel (PLAVIX) 75 MG tablet Take 1 tablet (75 mg total) by mouth daily. 12/03/14   Rai, Ripudeep K, MD  escitalopram (LEXAPRO) 20 MG tablet Take 20 mg by mouth daily. 01/01/21   [provider]  gabapentin (NEURONTIN) 300 MG capsule Take 300 mg by mouth at bedtime.    [provider]  glucose blood (ACCU-CHEK GUIDE) test strip Use as instructed 08/23/19   Roma Kayser, MD  insulin aspart (NOVOLOG FLEXPEN) 100 UNIT/ML FlexPen Inject 5 Units into the skin 3 (three) times daily with meals. 05/12/23   Dani Gobble, NP  insulin degludec (TRESIBA FLEXTOUCH) 100 UNIT/ML FlexTouch Pen Inject 10 Units into the skin at bedtime. 05/10/23  Dani Gobble, NP  Insulin Pen Needle (PEN NEEDLES) 31G X 6 MM MISC Use to inject insulin once daily 05/10/23   Dani Gobble, NP  LOKELMA 10 g PACK packet Take 10 g by mouth 3 (three) times a week. 07/14/22   [provider]  nystatin cream (MYCOSTATIN) Apply 1 application  topically 2 (two) times daily as needed (Itching). 07/10/16   [provider]  ondansetron (ZOFRAN) 4 MG tablet TAKE 1 TABLET(4 MG) BY MOUTH TWICE DAILY AS NEEDED FOR NAUSEA OR VOMITING Patient taking differently: Take 4 mg by mouth every 8 (eight) hours as needed for nausea. 10/29/21   Dolores Frame, MD  oxyCODONE-acetaminophen (PERCOCET/ROXICET) 5-325 MG tablet Take 1 tablet by mouth every 6 (six) hours as needed for severe pain or moderate pain. 08/05/22   Karie Soda, MD  pantoprazole (PROTONIX) 40 MG tablet Take 1 tablet (40 mg total) by mouth daily. 11/02/22   Carlan, Jeral Pinch, NP  promethazine (PHENERGAN) 25 MG tablet Take 25 mg by mouth 4 (four) times daily as needed. 12/17/21   [provider]  sodium bicarbonate 650 MG tablet  Take 650 mg by mouth 2 (two) times daily. 06/14/22   [provider]    Physical Exam: Vitals:   05/20/23 1530 05/20/23 1536 05/20/23 1723 05/20/23 1726  BP: (!) 154/69   (!) 148/62  Pulse: (!) 57   62  Resp: 15     Temp:   98.2 F (36.8 C)   TempSrc:   Oral   SpO2: 96% 96%  93%  Weight:      Height:       General: Elderly female. Awake and alert and oriented x3. No acute cardiopulmonary distress.  HEENT: Normocephalic atraumatic.  Right and left ears normal in appearance.  Pupils equal, round, reactive to light. Extraocular muscles are intact. Sclerae anicteric and noninjected.  Moist mucosal membranes. No mucosal lesions.  Neck: Neck supple without lymphadenopathy. No carotid bruits. No masses palpated.  Cardiovascular: Regular rate with normal S1-S2 sounds. No murmurs, rubs, gallops auscultated. No JVD.  Respiratory: Diffuse wheezing.  No accessory muscle use. Abdomen: Soft, nontender, nondistended. Active bowel sounds. No masses or hepatosplenomegaly  Skin: No rashes, lesions, or ulcerations.  Dry, warm to touch. 2+ dorsalis pedis and radial pulses. Musculoskeletal: No calf or leg pain. All major joints not erythematous nontender.  No upper or lower joint deformation.  Good ROM.  No contractures  Psychiatric: Intact judgment and insight. Pleasant and cooperative. Neurologic: No focal neurological deficits. Strength is 5/5 and symmetric in upper and lower extremities.  Cranial nerves II through XII are grossly intact.   Data Reviewed: Results for orders placed or performed during the hospital encounter of 05/20/23 (from the past 24 hours)  CBG monitoring, ED     Status: Abnormal   Collection Time: 05/20/23  1:17 PM  Result Value Ref Range   Glucose-Capillary 69 (L) 70 - 99 mg/dL  Resp panel by RT-PCR (RSV, Flu A&B, Covid) Anterior Nasal Swab     Status: Abnormal   Collection Time: 05/20/23  1:19 PM   Specimen: Anterior Nasal Swab  Result Value Ref Range   SARS  Coronavirus 2 by RT PCR NEGATIVE NEGATIVE   Influenza A by PCR POSITIVE (A) NEGATIVE   Influenza B by PCR NEGATIVE NEGATIVE   Resp Syncytial Virus by PCR NEGATIVE NEGATIVE  CBC     Status: Abnormal   Collection Time: 05/20/23  2:05 PM  Result Value Ref  Range   WBC 14.3 (H) 4.0 - 10.5 K/uL   RBC 3.84 (L) 3.87 - 5.11 MIL/uL   Hemoglobin 11.3 (L) 12.0 - 15.0 g/dL   HCT 16.1 (L) 09.6 - 04.5 %   MCV 92.7 80.0 - 100.0 fL   MCH 29.4 26.0 - 34.0 pg   MCHC 31.7 30.0 - 36.0 g/dL   RDW 40.9 81.1 - 91.4 %   Platelets 378 150 - 400 K/uL   nRBC 0.0 0.0 - 0.2 %  Comprehensive metabolic panel     Status: Abnormal   Collection Time: 05/20/23  2:05 PM  Result Value Ref Range   Sodium 135 135 - 145 mmol/L   Potassium 3.8 3.5 - 5.1 mmol/L   Chloride 100 98 - 111 mmol/L   CO2 24 22 - 32 mmol/L   Glucose, Bld 121 (H) 70 - 99 mg/dL   BUN 66 (H) 8 - 23 mg/dL   Creatinine, Ser 7.82 (H) 0.44 - 1.00 mg/dL   Calcium 9.1 8.9 - 95.6 mg/dL   Total Protein 6.6 6.5 - 8.1 g/dL   Albumin 2.8 (L) 3.5 - 5.0 g/dL   AST 22 15 - 41 U/L   ALT 16 0 - 44 U/L   Alkaline Phosphatase 83 38 - 126 U/L   Total Bilirubin 0.4 0.0 - 1.2 mg/dL   GFR, Estimated 17 (L) >60 mL/min   Anion gap 11 5 - 15  Troponin I (High Sensitivity)     Status: Abnormal   Collection Time: 05/20/23  2:05 PM  Result Value Ref Range   Troponin I (High Sensitivity) 26 (H) <18 ng/L  Brain natriuretic peptide     Status: Abnormal   Collection Time: 05/20/23  2:05 PM  Result Value Ref Range   B Natriuretic Peptide 1,003.0 (H) 0.0 - 100.0 pg/mL  POC CBG, ED     Status: Abnormal   Collection Time: 05/20/23  3:50 PM  Result Value Ref Range   Glucose-Capillary 155 (H) 70 - 99 mg/dL  Troponin I (High Sensitivity)     Status: Abnormal   Collection Time: 05/20/23  5:00 PM  Result Value Ref Range   Troponin I (High Sensitivity) 23 (H) <18 ng/L  Lactic acid, plasma     Status: None   Collection Time: 05/20/23  5:00 PM  Result Value Ref Range    Lactic Acid, Venous 0.7 0.5 - 1.9 mmol/L  Protime-INR     Status: None   Collection Time: 05/20/23  5:00 PM  Result Value Ref Range   Prothrombin Time 13.7 11.4 - 15.2 seconds   INR 1.0 0.8 - 1.2  APTT     Status: None   Collection Time: 05/20/23  5:00 PM  Result Value Ref Range   aPTT 31 24 - 36 seconds    DG Chest Portable 1 View Result Date: 05/20/2023 CLINICAL DATA:  Shortness of breath and coughing. History of renal cell carcinoma. EXAM: PORTABLE CHEST 1 VIEW COMPARISON:  X-ray 05/19/2023 and older.  CT 01/10/23 FINDINGS: Stable cardiopericardial silhouette. Calcified aorta. There is increasing interstitial changes in the lungs with tiny effusions and some subtle areas of lung nodularity. Surgical clips in the right upper quadrant of the abdomen. Osteopenia and degenerative changes. IMPRESSION: Increasing interstitial changes the lungs with some subtle nodular areas and trace pleural fluid. Acute process is possible. Recommend short follow-up. Electronically Signed   By: Karen Kays M.D.   On: 05/20/2023 17:12     Assessment and Plan: No notes  have been filed under this hospital service. Service: Hospitalist  Principal Problem:   Acute respiratory failure with hypoxia (HCC) Active Problems:   Essential hypertension, benign   Carotid artery disease (HCC)   Mixed hyperlipidemia   Uncontrolled type 2 diabetes mellitus with hyperglycemia, without long-term current use of insulin (HCC)   Renal cell cancer, left (HCC)   Influenza A with pneumonia    Acute resp failure with hypoxia O2 Influenza Antibiotics: Will cover with community-acquired pneumonia antibiotics due to elevated white count.  Check procalcitonin. Tamiflu Robitussin Blood cultures drawn in the emergency department Sputum cultures CBC tomorrow Strep and Legionella antigen by urine Influenza screen  COPD exacerbation Continue LABA/ICS Steroids Diabetes Sliding scale insulin in addition to home insulin  regimen Hypertension Renal cell carcinoma   Advance Care Planning:   Code Status: Full Code confirmed by patient  Consults: Non  Family Communication:   Severity of Illness: The appropriate patient status for this patient is INPATIENT. Inpatient status is judged to be reasonable and necessary in order to provide the required intensity of service to ensure the patient's safety. The patient's presenting symptoms, physical exam findings, and initial radiographic and laboratory data in the context of their chronic comorbidities is felt to place them at high risk for further clinical deterioration. Furthermore, it is not anticipated that the patient will be medically stable for discharge from the hospital within 2 midnights of admission.   * I certify that at the point of admission it is my clinical judgment that the patient will require inpatient hospital care spanning beyond 2 midnights from the point of admission due to high intensity of service, high risk for further deterioration and high frequency of surveillance required.*  Author: Levie Heritage, DO 05/20/2023 6:36 PM  For on call review [email or web address].

## 2023-05-20 NOTE — ED Provider Notes (Signed)
 [place] EMERGENCY DEPARTMENT AT [facility] Provider Note   CSN: [id number] Arrival date & time: 05/20/23  1242     History  Chief Complaint  Patient presents with   Shortness of Breath    Grace Hess is a 68 y.o. female.  Patient is a 68 year old female who presents emergency department the chief complaint of increased shortness of breath and weakness.  Patient notes that she has been sick for approximate the past week.  She notes that she has had a productive cough.  Patient notes that she does have oxygen for use at home as needed.  Patient notes that she has had no associated nausea, vomiting, diarrhea.  She does note that her blood sugars have been elevated at home.  She denies any associated dizziness, lightheadedness or syncope.   Shortness of Breath      Home Medications Prior to Admission medications   Medication Sig Start Date End Date Taking? Authorizing Provider  acetaminophen (TYLENOL) 325 MG tablet Take 2 tablets (650 mg total) by mouth every 4 (four) hours as needed for headache or mild pain. 08/03/16   Abelino Derrick, PA-C  albuterol (VENTOLIN HFA) 108 (90 Base) MCG/ACT inhaler Inhale 1-2 puffs into the lungs every 4 (four) hours as needed for shortness of breath or wheezing.    [provider]  ALPRAZolam Prudy Feeler) 0.5 MG tablet Take 0.25 mg by mouth 2 (two) times daily. 03/20/19   [provider]  atorvastatin (LIPITOR) 40 MG tablet Take 1 tablet (40 mg total) by mouth daily. 05/09/23 08/07/23  Laurann Montana, PA-C  Biotin 5000 MCG CAPS Take 5,000 mcg by mouth daily. Patient not taking: Reported on 05/09/2023    [provider]  Blood Glucose Monitoring Suppl (ACCU-CHEK GUIDE) w/Device KIT 1 Piece by Does not apply route as directed. Patient not taking: Reported on 05/09/2023 08/23/19   Roma Kayser, MD  Budeson-Glycopyrrol-Formoterol (BREZTRI AEROSPHERE) 160-9-4.8 MCG/ACT AERO Inhale into the lungs. 1-2 times per day     [provider]  calcitRIOL (ROCALTROL) 0.25 MCG capsule Take 0.25 mcg by mouth every Monday, Wednesday, and Friday. 06/14/22 06/14/23  [provider]  calcium acetate (PHOSLO) 667 MG tablet Take 667 mg by mouth daily. 06/14/22 06/14/23  [provider]  Cholecalciferol (VITAMIN D3) 125 MCG (5000 UT) CAPS Take 5,000 Units by mouth once a week. Tuesday    [provider]  clopidogrel (PLAVIX) 75 MG tablet Take 1 tablet (75 mg total) by mouth daily. 12/03/14   Rai, Ripudeep K, MD  escitalopram (LEXAPRO) 20 MG tablet Take 20 mg by mouth daily. 01/01/21   [provider]  gabapentin (NEURONTIN) 300 MG capsule Take 300 mg by mouth at bedtime.    [provider]  glucose blood (ACCU-CHEK GUIDE) test strip Use as instructed 08/23/19   Roma Kayser, MD  insulin aspart (NOVOLOG FLEXPEN) 100 UNIT/ML FlexPen Inject 5 Units into the skin 3 (three) times daily with meals. 05/12/23   Dani Gobble, NP  insulin degludec (TRESIBA FLEXTOUCH) 100 UNIT/ML FlexTouch Pen Inject 10 Units into the skin at bedtime. 05/10/23   Dani Gobble, NP  Insulin Pen Needle (PEN NEEDLES) 31G X 6 MM MISC Use to inject insulin once daily 05/10/23   Dani Gobble, NP  LOKELMA 10 g PACK packet Take 10 g by mouth 3 (three) times a week. 07/14/22   [provider]  nystatin cream (MYCOSTATIN) Apply 1 application  topically 2 (two)  times daily as needed (Itching). 07/10/16   [provider]  ondansetron (ZOFRAN) 4 MG tablet TAKE 1 TABLET(4 MG) BY MOUTH TWICE DAILY AS NEEDED FOR NAUSEA OR VOMITING Patient taking differently: Take 4 mg by mouth every 8 (eight) hours as needed for nausea. 10/29/21   Dolores Frame, MD  oxyCODONE-acetaminophen (PERCOCET/ROXICET) 5-325 MG tablet Take 1 tablet by mouth every 6 (six) hours as needed for severe pain or moderate pain. 08/05/22   Karie Soda, MD  pantoprazole (PROTONIX) 40 MG tablet Take 1 tablet (40 mg total)  by mouth daily. 11/02/22   Carlan, Jeral Pinch, NP  promethazine (PHENERGAN) 25 MG tablet Take 25 mg by mouth 4 (four) times daily as needed. 12/17/21   [provider]  sodium bicarbonate 650 MG tablet Take 650 mg by mouth 2 (two) times daily. 06/14/22   [provider]      Allergies    Codeine, Latex, and Sulfa antibiotics    Review of Systems   Review of Systems  Respiratory:  Positive for shortness of breath.   All other systems reviewed and are negative.   Physical Exam Updated Vital Signs BP (!) 148/62   Pulse 62   Temp 98.2 F (36.8 C) (Oral)   Resp 15   Ht 5\' 5"  (1.651 m)   Wt 62.1 kg   SpO2 93%   BMI 22.80 kg/m  Physical Exam Vitals and nursing note reviewed.  Constitutional:      Appearance: Normal appearance.  HENT:     Head: Normocephalic and atraumatic.     Nose: Nose normal.     Mouth/Throat:     Mouth: Mucous membranes are moist.  Eyes:     Extraocular Movements: Extraocular movements intact.     Conjunctiva/sclera: Conjunctivae normal.     Pupils: Pupils are equal, round, and reactive to light.  Cardiovascular:     Rate and Rhythm: Normal rate and regular rhythm.     Pulses: Normal pulses.     Heart sounds: Normal heart sounds.  Pulmonary:     Effort: Pulmonary effort is normal.     Breath sounds: Normal breath sounds. No decreased breath sounds, wheezing, rhonchi or rales.  Chest:     Chest wall: No tenderness.  Abdominal:     General: Abdomen is flat. Bowel sounds are normal.     Palpations: Abdomen is soft. There is no mass.     Tenderness: There is no abdominal tenderness.  Musculoskeletal:        General: Normal range of motion.     Cervical back: Normal range of motion and neck supple.     Right lower leg: No edema.     Left lower leg: No edema.  Skin:    General: Skin is warm and dry.     Findings: No erythema.  Neurological:     General: No focal deficit present.     Mental Status: She is alert and oriented to person,  place, and time. Mental status is at baseline.  Psychiatric:        Mood and Affect: Mood normal.        Behavior: Behavior normal.        Thought Content: Thought content normal.        Judgment: Judgment normal.     ED Results / Procedures / Treatments   Labs (all labs ordered are listed, but only abnormal results are displayed) Labs Reviewed  RESP PANEL BY RT-PCR (RSV, FLU A&B, COVID)  RVPGX2 - Abnormal; Notable for the following components:      Result Value   Influenza A by PCR POSITIVE (*)    All other components within normal limits  CBC - Abnormal; Notable for the following components:   WBC 14.3 (*)    RBC 3.84 (*)    Hemoglobin 11.3 (*)    HCT 35.6 (*)    All other components within normal limits  COMPREHENSIVE METABOLIC PANEL - Abnormal; Notable for the following components:   Glucose, Bld 121 (*)    BUN 66 (*)    Creatinine, Ser 2.91 (*)    Albumin 2.8 (*)    GFR, Estimated 17 (*)    All other components within normal limits  BRAIN NATRIURETIC PEPTIDE - Abnormal; Notable for the following components:   B Natriuretic Peptide 1,003.0 (*)    All other components within normal limits  CBG MONITORING, ED - Abnormal; Notable for the following components:   Glucose-Capillary 69 (*)    All other components within normal limits  CBG MONITORING, ED - Abnormal; Notable for the following components:   Glucose-Capillary 155 (*)    All other components within normal limits  TROPONIN I (HIGH SENSITIVITY) - Abnormal; Notable for the following components:   Troponin I (High Sensitivity) 26 (*)    All other components within normal limits  CULTURE, BLOOD (ROUTINE X 2)  CULTURE, BLOOD (ROUTINE X 2)  LACTIC ACID, PLASMA  LACTIC ACID, PLASMA  PROTIME-INR  APTT  CBG MONITORING, ED  TROPONIN I (HIGH SENSITIVITY)    EKG None  Radiology DG Chest 2 View Result Date: 05/20/2023 CLINICAL DATA:  Cough and runny nose for 3-4 days. History of renal cell carcinoma EXAM: CHEST - 2  VIEW COMPARISON:  X-ray 11/28/2021.  CT 01/10/2023. FINDINGS: Stable mildly enlarged cardiac silhouette. No pneumothorax, edema. Mild interstitial prominence, unchanged from previous and likely chronic. Calcified aorta. Degenerative changes along the spine. Question tiny pleural effusions. IMPRESSION: Tiny pleural effusions.  Slight interstitial prominence. Electronically Signed   By: Karen Kays M.D.   On: 05/20/2023 17:14   DG Chest Portable 1 View Result Date: 05/20/2023 CLINICAL DATA:  Shortness of breath and coughing. History of renal cell carcinoma. EXAM: PORTABLE CHEST 1 VIEW COMPARISON:  X-ray 05/19/2023 and older.  CT 01/10/23 FINDINGS: Stable cardiopericardial silhouette. Calcified aorta. There is increasing interstitial changes in the lungs with tiny effusions and some subtle areas of lung nodularity. Surgical clips in the right upper quadrant of the abdomen. Osteopenia and degenerative changes. IMPRESSION: Increasing interstitial changes the lungs with some subtle nodular areas and trace pleural fluid. Acute process is possible. Recommend short follow-up. Electronically Signed   By: Karen Kays M.D.   On: 05/20/2023 17:12    Procedures .Critical Care  Performed by: Lelon Perla, PA-C Authorized by: Lelon Perla, PA-C   Critical care provider statement:    Critical care time (minutes):  35   Critical care was necessary to treat or prevent imminent or life-threatening deterioration of the following conditions:  Sepsis   Critical care was time spent personally by me on the following activities:  Development of treatment plan with patient or surrogate, discussions with consultants, evaluation of patient's response to treatment, examination of patient, ordering and review of laboratory studies, ordering and review of radiographic studies, ordering and performing treatments and interventions, pulse oximetry, re-evaluation of patient's condition and review of old charts   I  assumed direction of critical care for this patient from another  provider in my specialty: no     Care discussed with: admitting provider       Medications Ordered in ED Medications  cefTRIAXone (ROCEPHIN) 1 g in sodium chloride 0.9 % 100 mL IVPB (1 g Intravenous New Bag/Given 05/20/23 1721)  azithromycin (ZITHROMAX) 500 mg in sodium chloride 0.9 % 250 mL IVPB (has no administration in time range)  lactated ringers infusion (has no administration in time range)  ipratropium-albuterol (DUONEB) 0.5-2.5 (3) MG/3ML nebulizer solution 3 mL (3 mLs Nebulization Given 05/20/23 1536)    ED Course/ Medical Decision Making/ A&P                                 Medical Decision Making Amount and/or Complexity of Data Reviewed Labs: ordered. Radiology: ordered.  Risk Prescription drug management.   This patient presents to the ED for concern of cough, shortness of breath, generalized malaise and fatigue, this involves an extensive number of treatment options, and is a complaint that carries with it a high risk of complications and morbidity.  The differential diagnosis includes sepsis, pneumonia, acute viral syndrome, hyperglycemia, hypoglycemia, ACS, acute CHF, pulmonary embolus   Co morbidities that complicate the patient evaluation  Renal cancer   Additional history obtained:  Additional history obtained from husband External records from outside source obtained and reviewed including none   Lab Tests:  I Ordered, and personally interpreted labs.  The pertinent results include: Leukocytosis, anemia, elevated creatinine, influenza positive   Imaging Studies ordered:  I ordered imaging studies including chest x-ray I independently visualized and interpreted imaging which showed right lower lobe pneumonia I agree with the radiologist interpretation   Cardiac Monitoring: / EKG:  The patient was maintained on a cardiac monitor.  I personally viewed and interpreted the cardiac  monitored which showed an underlying rhythm of: Normal sinus rhythm, normal PR/QRS interval, normal QTc, no ST/T wave changes, no ischemic changes, no STEMI   Consultations Obtained:  I requested consultation with the hospitalist,  and discussed lab and imaging findings as well as pertinent plan - they recommend: Admission   Problem List / ED Course / Critical interventions / Medication management  Patient is doing well at this time.  Discussed with patient we will plan for admission to the hospital service for further workup and management of her sepsis, pneumonia, proximal respiratory failure.  Patient does have stable creatinine on lab work.  She is influenza positive at this time is outside the window for Tamiflu.  Patient has a benign abdominal exam and no focal tenderness and do not expect underlying acute intra-abdominal surgical pathology.  Will discuss patient case with hospitalist Dr. Adrian Blackwater at this time for admission. I ordered medication including Rocephin, azithromycin, DuoNeb for sepsis Reevaluation of the patient after these medicines showed that the patient improved I have reviewed the patients home medicines and have made adjustments as needed   Social Determinants of Health:  None   Test / Admission - Considered:  Admission        Final Clinical Impression(s) / ED Diagnoses Final diagnoses:  None    Rx / DC Orders ED Discharge Orders     None         Lelon Perla, PA-C 05/20/23 1753    Eber Hong, MD 05/21/23 1442

## 2023-05-20 NOTE — ED Notes (Signed)
 Attempted to call report to Med Surg was told patient was just assigned and someone would call back

## 2023-05-20 NOTE — Progress Notes (Signed)
 Elink is following code sepsis.

## 2023-05-20 NOTE — Telephone Encounter (Signed)
 Patient left a message that she is not feeling any better. Her blood sugars are running high, that she is in misery. Talked with Whitneyprior to retuning the call to the patient. She recommended the following.  Novolog 5 units before meals if the patient is eating and blood sugar is above 90.  151-200 6 -units 201-250 7- units 251-300 8- units 301- 350- 9 units 351-400 -10 units Greater than 400 -11 units  Patient is to inject [place] 25 units at bedtime.  I called the patient and her husband answered. He shares that she was out of it, she sleeps 22 hours our of 24. Only gets up to go to the bathroom. I ask him what was her readings and he shared that the CGM just read HIGH. He has been trying to get her up to go to a doctor or to the ED.  I then spoke with ,Darel Hong. She sounded sluggish and said that she just did not know what was going on. I advised her that she allow her husband take her to the ED or I would call EMS. She agreed to go to the hospital.

## 2023-05-21 DIAGNOSIS — J9601 Acute respiratory failure with hypoxia: Secondary | ICD-10-CM | POA: Diagnosis not present

## 2023-05-21 LAB — CBC
HCT: 31.6 % — ABNORMAL LOW (ref 36.0–46.0)
Hemoglobin: 10 g/dL — ABNORMAL LOW (ref 12.0–15.0)
MCH: 28.9 pg (ref 26.0–34.0)
MCHC: 31.6 g/dL (ref 30.0–36.0)
MCV: 91.3 fL (ref 80.0–100.0)
Platelets: 335 10*3/uL (ref 150–400)
RBC: 3.46 MIL/uL — ABNORMAL LOW (ref 3.87–5.11)
RDW: 13.2 % (ref 11.5–15.5)
WBC: 10.7 10*3/uL — ABNORMAL HIGH (ref 4.0–10.5)
nRBC: 0 % (ref 0.0–0.2)

## 2023-05-21 LAB — HIV ANTIBODY (ROUTINE TESTING W REFLEX): HIV Screen 4th Generation wRfx: NONREACTIVE

## 2023-05-21 LAB — GLUCOSE, CAPILLARY
Glucose-Capillary: 206 mg/dL — ABNORMAL HIGH (ref 70–99)
Glucose-Capillary: 215 mg/dL — ABNORMAL HIGH (ref 70–99)
Glucose-Capillary: 265 mg/dL — ABNORMAL HIGH (ref 70–99)
Glucose-Capillary: 232 mg/dL — ABNORMAL HIGH (ref 70–99)

## 2023-05-21 LAB — BASIC METABOLIC PANEL
Anion gap: 5 (ref 5–15)
BUN: 62 mg/dL — ABNORMAL HIGH (ref 8–23)
CO2: 21 mmol/L — ABNORMAL LOW (ref 22–32)
Calcium: 8.2 mg/dL — ABNORMAL LOW (ref 8.9–10.3)
Chloride: 108 mmol/L (ref 98–111)
Creatinine, Ser: 2.46 mg/dL — ABNORMAL HIGH (ref 0.44–1.00)
GFR, Estimated: 21 mL/min — ABNORMAL LOW (ref >60)
Glucose, Bld: 194 mg/dL — ABNORMAL HIGH (ref 70–99)
Potassium: 4.8 mmol/L (ref 3.5–5.1)
Sodium: 134 mmol/L — ABNORMAL LOW (ref 135–145)

## 2023-05-21 LAB — STREP PNEUMONIAE URINARY ANTIGEN: Strep Pneumo Urinary Antigen: NEGATIVE

## 2023-05-21 MED ORDER — METHYLPREDNISOLONE SODIUM SUCC 40 MG IJ SOLR
40.0000 mg | Freq: Two times a day (BID) | INTRAMUSCULAR | Status: DC
  Administered 2023-05-21 – 2023-05-22 (×2): 40 mg via INTRAVENOUS
  Filled 2023-05-21 (×3): qty 1

## 2023-05-21 MED ORDER — INSULIN ASPART 100 UNIT/ML IJ SOLN
0.0000 [IU] | Freq: Three times a day (TID) | INTRAMUSCULAR | Status: DC
  Administered 2023-05-21: 11 [IU] via SUBCUTANEOUS
  Administered 2023-05-21 (×2): 7 [IU] via SUBCUTANEOUS
  Administered 2023-05-22: 11 [IU] via SUBCUTANEOUS

## 2023-05-21 MED ORDER — VITAMIN C 500 MG PO TABS
500.0000 mg | ORAL_TABLET | Freq: Every day | ORAL | Status: DC
Start: 2023-05-21 — End: 2023-05-22
  Administered 2023-05-21 – 2023-05-22 (×2): 500 mg via ORAL
  Filled 2023-05-21 (×2): qty 1

## 2023-05-21 MED ORDER — LACTATED RINGERS IV SOLN: INTRAVENOUS | Status: AC

## 2023-05-21 MED ORDER — RISAQUAD PO CAPS
2.0000 | ORAL_CAPSULE | Freq: Three times a day (TID) | ORAL | Status: DC
  Administered 2023-05-21 – 2023-05-22 (×4): 2 via ORAL
  Filled 2023-05-21 (×4): qty 2

## 2023-05-21 MED ORDER — ORAL CARE MOUTH RINSE: 15.0000 mL | OROMUCOSAL | Status: DC | PRN

## 2023-05-21 MED ORDER — INSULIN GLARGINE 100 UNIT/ML ~~LOC~~ SOLN
15.0000 [IU] | Freq: Every day | SUBCUTANEOUS | Status: DC
Start: 2023-05-21 — End: 2023-05-21
  Filled 2023-05-21: qty 0.15

## 2023-05-21 MED ORDER — GUAIFENESIN-DM 100-10 MG/5ML PO SYRP
10.0000 mL | ORAL_SOLUTION | Freq: Three times a day (TID) | ORAL | Status: DC
  Administered 2023-05-21 – 2023-05-22 (×4): 10 mL via ORAL
  Filled 2023-05-21 (×4): qty 10

## 2023-05-21 MED ORDER — INSULIN ASPART 100 UNIT/ML IJ SOLN
0.0000 [IU] | Freq: Every day | INTRAMUSCULAR | Status: DC
  Administered 2023-05-21: 2 [IU] via SUBCUTANEOUS

## 2023-05-21 MED ORDER — AZITHROMYCIN 250 MG PO TABS
500.0000 mg | ORAL_TABLET | Freq: Every day | ORAL | Status: DC
  Administered 2023-05-21 – 2023-05-22 (×2): 500 mg via ORAL
  Filled 2023-05-21 (×2): qty 2

## 2023-05-21 MED ORDER — CLOPIDOGREL BISULFATE 75 MG PO TABS: 75.0000 mg | ORAL_TABLET | Freq: Every day | ORAL | Status: DC

## 2023-05-21 MED ORDER — INSULIN GLARGINE-YFGN 100 UNIT/ML ~~LOC~~ SOLN
15.0000 [IU] | Freq: Every day | SUBCUTANEOUS | Status: DC
  Administered 2023-05-21: 15 [IU] via SUBCUTANEOUS
  Filled 2023-05-21 (×2): qty 0.15

## 2023-05-21 NOTE — Assessment & Plan Note (Signed)
 Monitoring H&H

## 2023-05-21 NOTE — Progress Notes (Signed)
 PROGRESS NOTE    Patient: Grace Hess                            PCP: Elfredia Nevins, MD                    DOB: 1955/06/09            DOA: 05/20/2023 ZOX:[id number]             DOS: 05/21/2023, 10:03 AM   LOS: 1 day   Date of Service: The patient was seen and examined on 05/21/2023  Subjective:   The patient was seen and examined this morning. Hemodynamically stable. No issues overnight .  Brief Narrative:   Grace Hess is a 68 y.o. female with medical history significant of renal cell carcinoma, coronary artery disease with stent placed in 2018, TIAs, GERD, depression, hypertension, COPD, type 2 diabetes.  Patient seen with increasing shortness of breath over the last week with purulent sputum production she saw her PCP who had her do symptomatic treatments.  She presented here due to increasing shortness of breath with dyspnea on exertion. No fevers, but decreased appetite.   In ED patient was found hypoxic, influenza A positive, with pneumonia, creatinine    Assessment & Plan:   Principal Problem:   Acute respiratory failure with hypoxia (HCC) Active Problems:   Depression with anxiety   Essential hypertension, benign   GERD (gastroesophageal reflux disease)   Carotid artery disease (HCC)   Mixed hyperlipidemia   Uncontrolled type 2 diabetes mellitus with hyperglycemia, without long-term current use of insulin (HCC)   Renal cell cancer, left (HCC)   Iron deficiency anemia   Influenza A with pneumonia     Assessment and Plan: * Acute respiratory failure with hypoxia (HCC) - Acute on chronic respiratory failure, with history of COPD, O2 dependent-exacerbated due to influenza, viral pneumonia -Will continue supplemental oxygen, maintaining O2 sat greater than 92% -Continue DuoNeb bronchodilators -Initiating IV steroid -Continue mucolytics   Influenza A with pneumonia - Influenza A positive Complicated by acute on chronic respiratory failure -Continue  supportive therapy, supplemental oxygen, DuoNeb, IV steroids, mucolytics -Will continue Tamiflu  Iron deficiency anemia - Monitoring H&H  Renal cell cancer, left (HCC) - History of left renal cell carcinoma - Patient to follow-up with primary oncologist as an outpatient   With  Acute on chronic renal disease CKD stage IIIb -Monitoring BUN/creatinine closely, -Continue home medication of sodium bicarb, calcium acetate, -Avoid nephrotoxins  Uncontrolled type 2 diabetes mellitus with hyperglycemia, without long-term current use of insulin (HCC) - Resuming home medication of Lantus -Anticipating hyperglycemia on steroids -Will check CBG q. ACHS, SSI coverage   Mixed hyperlipidemia - Continue atorvastatin  Carotid artery disease (HCC) History of carotid artery disease, peripheral vascular disease, - Currently stable, not complaining  Will continue Plavix, Lipitor, Plavix  GERD (gastroesophageal reflux disease) - Continue Protonix  Essential hypertension, benign - History of hypertension currently not on any home BP meds -Blood pressure remained stable, will continue to monitor  Depression with anxiety - Mood stable, continue home medication of Lexapro, and Xanax       ----------------------------------------------------------------------------------------------------------------------------------------------- Nutritional status:  The patient's BMI is: Body mass index is 22.71 kg/m. I agree with the assessment and plan as outlined -------------------------------------------------------------------------------------------------------------------------------------- Cultures; Blood Cultures x 2 >> ------------------------------------------------------------------------------------------------------------------------------------------  DVT prophylaxis:  Place and maintain sequential compression device Start: 05/21/23 1004 enoxaparin (LOVENOX) injection 30 mg  Start:  05/20/23 2200   Code Status:   Code Status: Full Code  Family Communication: No family member present at bedside- -Advance care planning has been discussed.   Admission status:   Status is: Inpatient Remains inpatient appropriate because: Influenza with Acute Respiratory Failure --needing respiratory support,  , Continue need of   Disposition: From  - home             Planning for discharge in 1-2 days: to home with home health  Procedures:   No admission procedures for hospital encounter.   Antimicrobials:  Anti-infectives (From admission, onward)    Start     Dose/Rate Route Frequency Ordered Stop   05/21/23 1800  azithromycin (ZITHROMAX) 500 mg in sodium chloride 0.9 % 250 mL IVPB  Status:  Discontinued        500 mg 250 mL/hr over 60 Minutes Intravenous Every 24 hours 05/20/23 2019 05/21/23 0735   05/21/23 1600  cefTRIAXone (ROCEPHIN) 2 g in sodium chloride 0.9 % 100 mL IVPB        2 g 200 mL/hr over 30 Minutes Intravenous Every 24 hours 05/20/23 2019 05/25/23 1559   05/21/23 1200  azithromycin (ZITHROMAX) tablet 500 mg        500 mg Oral Daily 05/21/23 0735     05/20/23 2115  cefTRIAXone (ROCEPHIN) 1 g in sodium chloride 0.9 % 100 mL IVPB        1 g 200 mL/hr over 30 Minutes Intravenous  Once 05/20/23 2019 05/20/23 2332   05/20/23 2045  oseltamivir (TAMIFLU) capsule 30 mg        30 mg Oral Every other day 05/20/23 2019 05/24/23 0959   05/20/23 1645  cefTRIAXone (ROCEPHIN) 1 g in sodium chloride 0.9 % 100 mL IVPB        1 g 200 mL/hr over 30 Minutes Intravenous  Once 05/20/23 1640 05/20/23 1916   05/20/23 1645  azithromycin (ZITHROMAX) 500 mg in sodium chloride 0.9 % 250 mL IVPB        500 mg 250 mL/hr over 60 Minutes Intravenous  Once 05/20/23 1640 05/20/23 2024        Medication:   acidophilus  2 capsule Oral TID   ALPRAZolam  0.25 mg Oral BID   ascorbic acid  500 mg Oral Daily   azithromycin  500 mg Oral Daily   [START ON 05/23/2023] calcitRIOL  0.25 mcg  Oral Q M,W,F   [START ON 05/23/2023] clopidogrel  75 mg Oral Daily   enoxaparin (LOVENOX) injection  30 mg Subcutaneous Q24H   guaiFENesin-dextromethorphan  10 mL Oral Q8H   insulin aspart  0-20 Units Subcutaneous TID WC   insulin aspart  0-5 Units Subcutaneous QHS   insulin glargine  15 Units Subcutaneous QHS   ipratropium-albuterol  3 mL Nebulization Q6H   methylPREDNISolone (SOLU-MEDROL) injection  40 mg Intravenous Q12H   mometasone-formoterol  2 puff Inhalation BID   And   umeclidinium bromide  1 puff Inhalation Daily   oseltamivir  30 mg Oral QODAY   pantoprazole  40 mg Oral Daily   sodium bicarbonate  650 mg Oral BID   [START ON 05/23/2023] sodium zirconium cyclosilicate  10 g Oral Once per day on Monday Wednesday Friday    acetaminophen **OR** acetaminophen, ondansetron **OR** ondansetron (ZOFRAN) IV, mouth rinse   Objective:   Vitals:   05/20/23 2243 05/21/23 0024 05/21/23 0314 05/21/23 0410  BP:  (!) 162/64  (!) 142/85  Pulse:  (!) 57  71  Resp:  16  18  Temp:  98.8 F (37.1 C)  98.3 F (36.8 C)  TempSrc:  Oral  Oral  SpO2: 95% 94% 90% 92%  Weight:      Height:        Intake/Output Summary (Last 24 hours) at 05/21/2023 1003 Last data filed at 05/21/2023 0900 Gross per 24 hour  Intake 2570 ml  Output 700 ml  Net 1870 ml   Filed Weights   05/20/23 1314 05/20/23 2003  Weight: 62.1 kg 61.9 kg     Physical examination:   Constitution:  Alert, cooperative, no distress,  Appears calm and comfortable  Psychiatric:   Normal and stable mood and affect, cognition intact,   HEENT:        Normocephalic, PERRL, otherwise with in Normal limits  Chest:         Chest symmetric Cardio vascular:  S1/S2, RRR, No murmure, No Rubs or Gallops  pulmonary: Clear to auscultation bilaterally, respirations unlabored, negative wheezes / crackles Abdomen: Soft, non-tender, non-distended, bowel sounds,no masses, no organomegaly Muscular skeletal: Limited exam - in bed, able to move  all 4 extremities,   Neuro: CNII-XII intact. , normal motor and sensation, reflexes intact  Extremities: No pitting edema lower extremities, +2 pulses  Skin: Dry, warm to touch, negative for any Rashes, No open wounds Wounds: per nursing documentation   ------------------------------------------------------------------------------------------------------------------------------------------    LABs:     Latest Ref Rng & Units 05/21/2023    4:42 AM 05/20/2023    2:05 PM 05/11/2023   10:33 AM  CBC  WBC 4.0 - 10.5 K/uL 10.7  14.3  7.2   Hemoglobin 12.0 - 15.0 g/dL 16.1  09.6  04.5   Hematocrit 36.0 - 46.0 % 31.6  35.6  35.8   Platelets 150 - 400 K/uL 335  378  147       Latest Ref Rng & Units 05/21/2023    4:42 AM 05/20/2023    2:05 PM 05/11/2023   10:58 AM  CMP  Glucose 70 - 99 mg/dL 409  811  914   BUN 8 - 23 mg/dL 62  66  47   Creatinine 0.44 - 1.00 mg/dL 7.82  9.56  2.13   Sodium 135 - 145 mmol/L 134  135  127   Potassium 3.5 - 5.1 mmol/L 4.8  3.8  4.2   Chloride 98 - 111 mmol/L 108  100  92   CO2 22 - 32 mmol/L 21  24  21    Calcium 8.9 - 10.3 mg/dL 8.2  9.1  8.4   Total Protein 6.5 - 8.1 g/dL  6.6  6.6   Total Bilirubin 0.0 - 1.2 mg/dL  0.4  0.6   Alkaline Phos 38 - 126 U/L  83  65   AST 15 - 41 U/L  22  18   ALT 0 - 44 U/L  16  17        Micro Results Recent Results (from the past 240 hours)  Resp panel by RT-PCR (RSV, Flu A&B, Covid) Anterior Nasal Swab     Status: Abnormal   Collection Time: 05/20/23  1:19 PM   Specimen: Anterior Nasal Swab  Result Value Ref Range Status   SARS Coronavirus 2 by RT PCR NEGATIVE NEGATIVE Final    Comment: (NOTE) SARS-CoV-2 target nucleic acids are NOT DETECTED.  The SARS-CoV-2 RNA is generally detectable in upper respiratory specimens during the acute phase of infection. The lowest concentration of SARS-CoV-2  viral copies this assay can detect is 138 copies/mL. A negative result does not preclude SARS-Cov-2 infection and should not  be used as the sole basis for treatment or other patient management decisions. A negative result may occur with  improper specimen collection/handling, submission of specimen other than nasopharyngeal swab, presence of viral mutation(s) within the areas targeted by this assay, and inadequate number of viral copies(<138 copies/mL). A negative result must be combined with clinical observations, patient history, and epidemiological information. The expected result is Negative.  Fact Sheet for Patients:  BloggerCourse.com  Fact Sheet for Healthcare Providers:  SeriousBroker.it  This test is no t yet approved or cleared by the [place] FDA and  has been authorized for detection and/or diagnosis of SARS-CoV-2 by FDA under an Emergency Use Authorization (EUA). This EUA will remain  in effect (meaning this test can be used) for the duration of the COVID-19 declaration under Section 564(b)(1) of the Act, 21 U.S.C.section 360bbb-3(b)(1), unless the authorization is terminated  or revoked sooner.       Influenza A by PCR POSITIVE (A) NEGATIVE Final   Influenza B by PCR NEGATIVE NEGATIVE Final    Comment: (NOTE) The Xpert Xpress SARS-CoV-2/FLU/RSV plus assay is intended as an aid in the diagnosis of influenza from Nasopharyngeal swab specimens and should not be used as a sole basis for treatment. Nasal washings and aspirates are unacceptable for Xpert Xpress SARS-CoV-2/FLU/RSV testing.  Fact Sheet for Patients: BloggerCourse.com  Fact Sheet for Healthcare Providers: SeriousBroker.it  This test is not yet approved or cleared by the [place] FDA and has been authorized for detection and/or diagnosis of SARS-CoV-2 by FDA under an Emergency Use Authorization (EUA). This EUA will remain in effect (meaning this test can be used) for the duration of the COVID-19 declaration under  Section 564(b)(1) of the Act, 21 U.S.C. section 360bbb-3(b)(1), unless the authorization is terminated or revoked.     Resp Syncytial Virus by PCR NEGATIVE NEGATIVE Final    Comment: (NOTE) Fact Sheet for Patients: BloggerCourse.com  Fact Sheet for Healthcare Providers: SeriousBroker.it  This test is not yet approved or cleared by the [place] FDA and has been authorized for detection and/or diagnosis of SARS-CoV-2 by FDA under an Emergency Use Authorization (EUA). This EUA will remain in effect (meaning this test can be used) for the duration of the COVID-19 declaration under Section 564(b)(1) of the Act, 21 U.S.C. section 360bbb-3(b)(1), unless the authorization is terminated or revoked.  Performed at [facility], [address], [place], [place] [address]   Blood Culture (routine x 2)     Status: None (Preliminary result)   Collection Time: 05/20/23  5:00 PM   Specimen: BLOOD  Result Value Ref Range Status   Specimen Description BLOOD BLOOD RIGHT ARM  Final   Special Requests NONE  Final   Culture   Final    NO GROWTH < 12 HOURS Performed at [facility], [address], [place], [place] [address]    Report Status PENDING  Incomplete  Blood Culture (routine x 2)     Status: None (Preliminary result)   Collection Time: 05/20/23  5:40 PM   Specimen: BLOOD  Result Value Ref Range Status   Specimen Description BLOOD BLOOD LEFT ARM  Final   Special Requests NONE  Final   Culture   Final    NO GROWTH < 12 HOURS Performed at [facility], [address], [place], [place] [address]    Report Status PENDING  Incomplete    Radiology Reports DG Chest Portable 1 View Result Date: 05/20/2023 CLINICAL DATA:  Shortness of breath and coughing. History of renal cell carcinoma. EXAM: PORTABLE CHEST 1 VIEW COMPARISON:  X-ray 05/19/2023 and older.  CT 01/10/23 FINDINGS: Stable cardiopericardial silhouette. Calcified aorta. There is  increasing interstitial changes in the lungs with tiny effusions and some subtle areas of lung nodularity. Surgical clips in the right upper quadrant of the abdomen. Osteopenia and degenerative changes. IMPRESSION: Increasing interstitial changes the lungs with some subtle nodular areas and trace pleural fluid. Acute process is possible. Recommend short follow-up. Electronically Signed   By: Karen Kays M.D.   On: 05/20/2023 17:12    SIGNED: Kendell Bane, MD, FHM. FAAFP. [facility] Gainer - [facility] Time spent - 35 min.  In seeing, evaluating and examining the patient. Reviewing medical records, labs, drawn plan of care. [facility],  Pager (please use [email or web address] to page/ text) Please use Epic Secure Chat for non-urgent communication (7AM-7PM)  If 7PM-7AM, please contact night-coverage [email or web address], 05/21/2023, 10:03 AM

## 2023-05-21 NOTE — Progress Notes (Signed)
 Shift note: Patient has rested in bed throughout shift. Reports not feeling well. Humidity added to o2; reports it has helped nasal discomfort. No complaints of pain. SCDs placed on patient. Husband at bedside educated on new meds. Call bell within reach. Remains oriented to unit.

## 2023-05-21 NOTE — Assessment & Plan Note (Signed)
-   History of hypertension currently not on any home BP meds -Blood pressure remained stable, will continue to monitor

## 2023-05-21 NOTE — Assessment & Plan Note (Signed)
-   Influenza A positive Complicated by acute on chronic respiratory failure -Continue supportive therapy, supplemental oxygen, DuoNeb, IV steroids, mucolytics -Will continue Tamiflu

## 2023-05-21 NOTE — Assessment & Plan Note (Addendum)
-   History of left renal cell carcinoma - Patient to follow-up with primary oncologist as an outpatient   With  Acute on chronic renal disease CKD stage IIIb -Monitoring BUN/creatinine closely, -Continue home medication of sodium bicarb, calcium acetate, -Avoid nephrotoxins

## 2023-05-21 NOTE — Assessment & Plan Note (Signed)
-   Acute on chronic respiratory failure, with history of COPD, O2 dependent-exacerbated due to influenza, viral pneumonia -Will continue supplemental oxygen, maintaining O2 sat greater than 92% -Continue DuoNeb bronchodilators -Initiating IV steroid -Continue mucolytics

## 2023-05-21 NOTE — Assessment & Plan Note (Signed)
-   Resuming home medication of Lantus -Anticipating hyperglycemia on steroids -Will check CBG q. ACHS, SSI coverage

## 2023-05-21 NOTE — TOC Initial Note (Signed)
 Transition of Care [facility]) - Initial/Assessment Note    Patient Details  Name: Grace Hess MRN: [id number] Date of Birth: [birth year]  [facility]) CM/SW Contact:    Beather Arbour Phone Number: 05/21/2023, 12:01 PM  Clinical Narrative:                 Patient is risk for readmission. Patient was admitted for Acute respiratory failure with hypoxia . CSW spoke with husband who was at bedside due to patient sleeping. Spouse was able to share that patient is still independent and can drive. Patient has never had HH before but has oxygen at home that was delivered a year ago , and never had to use it. CSW reviewed chart and seen that it was from [employer].  TOC will follow pt regarding oxygen needs.   Expected Discharge Plan: Home/Self Care Barriers to Discharge: Continued Medical Work up   Patient Goals and CMS Choice Patient states their goals for this hospitalization and ongoing recovery are:: return back home CMS Medicare.gov Compare Post Acute Care list provided to:: Patient Represenative (must comment) (Spouse- Chrissie Noa) Choice offered to / list presented to : Spouse      Expected Discharge Plan and Services     Post Acute Care Choice: Durable Medical Equipment Living arrangements for the past 2 months: Single Family Home                   DME Agency: [employer]                  Prior Living Arrangements/Services Living arrangements for the past 2 months: Single Family Home Lives with:: Relatives, Spouse (Grandson and grandchildren) Patient language and need for interpreter reviewed:: Yes Do you feel safe going back to the place where you live?: Yes      Need for Family Participation in Patient Care: Yes (Comment) Care giver support system in place?: Yes (comment)   Criminal Activity/Legal Involvement Pertinent to Current Situation/Hospitalization: No - Comment as needed  Activities of Daily Living   ADL Screening (condition at time of  admission) Independently performs ADLs?: Yes (appropriate for developmental age) Is the patient deaf or have difficulty hearing?: No Does the patient have difficulty seeing, even when wearing glasses/contacts?: No Does the patient have difficulty concentrating, remembering, or making decisions?: No  Permission Sought/Granted      Share Information with NAME: Chrissie Noa     Permission granted to share info w Relationship: Spouse     Emotional Assessment Appearance:: Appears stated age   Affect (typically observed): Accepting Orientation: : Oriented to Self, Oriented to Place, Oriented to  Time, Oriented to Situation Alcohol / Substance Use: Not Applicable Psych Involvement: No (comment)  Admission diagnosis:  Influenza A [J10.1] Acute respiratory failure with hypoxia (HCC) [J96.01] Pneumonia of right lower lobe due to infectious organism [J18.9] Sepsis, due to unspecified organism, unspecified whether acute organ dysfunction present [facility]) [A41.9] Patient Active Problem List   Diagnosis Date Noted   Acute respiratory failure with hypoxia (HCC) 05/20/2023   Influenza A with pneumonia 05/20/2023   Iron deficiency anemia 01/18/2023   Incarcerated incisional hernia 08/05/2022   Renal cell adenocarcinoma (HCC) 11/25/2021   Renal cell cancer, left (HCC) 10/01/2021   Left renal mass    Acute renal failure superimposed on stage 3b chronic kidney disease (HCC) 07/14/2021   Perianal abscess 03/30/2021   Vitamin D deficiency 08/23/2019   Loss of weight 07/17/2019   PAD (peripheral artery disease) (  HCC) 07/04/2019   IBS (irritable bowel syndrome) 01/16/2019   Uncontrolled type 2 diabetes mellitus with hyperglycemia, without long-term current use of insulin (HCC) 01/08/2019   Type 2 diabetes mellitus without complication (HCC) 12/21/2018   Lumbar back pain 12/21/2018   Mixed hyperlipidemia 12/21/2018   CAD S/P percutaneous coronary angioplasty 08/03/2016   Claudication (HCC) 08/02/2016    Abnormal stress test 08/02/2016   Stable angina (HCC) 08/02/2016   Heavy smoker (more than 20 cigarettes per day)    Headache 12/10/2014   Carotid artery disease (HCC) 12/01/2014   TIA (transient ischemic attack) 11/28/2014   Tobacco use disorder 11/28/2014   HYPERCHOLESTEROLEMIA  IIA 09/13/2008   Depression with anxiety 09/13/2008   Essential hypertension, benign 09/13/2008   GERD (gastroesophageal reflux disease) 09/13/2008   PCP:  Elfredia Nevins, MD Pharmacy:   [facility] [phone] - [place], [place] - [address] AT [facility] [address] [place] [address] Phone: [phone] Fax: [phone]  [id number] - [place], [place] - [address] AT [facility] OF [address] & [address] [place] [address] Phone: [phone] Fax: [phone]     Social Drivers of Health (SDOH) Social History: SDOH Screenings   Food Insecurity: No Food Insecurity (05/20/2023)  Housing: Low Risk  (05/20/2023)  Transportation Needs: No Transportation Needs (05/20/2023)  Utilities: Not At Risk (05/20/2023)  Depression (PHQ2-9): Low Risk  (12/21/2018)  Financial Resource Strain: Low Risk  (05/18/2022)   Received from [facility]  Social Connections: Moderately Isolated (05/20/2023)  Tobacco Use: High Risk (05/20/2023)   SDOH Interventions:     Readmission Risk Interventions    05/21/2023   11:58 AM  Readmission Risk Prevention Plan  Transportation Screening Complete  HRI or Home Care Consult Complete  Social Work Consult for Recovery Care Planning/Counseling Complete  Palliative Care Screening Not Applicable  Medication Review [employer]) Complete

## 2023-05-21 NOTE — Hospital Course (Signed)
 Grace Hess is a 68 y.o. female with medical history significant of renal cell carcinoma, coronary artery disease with stent placed in 2018, TIAs, GERD, depression, hypertension, COPD, type 2 diabetes.  Patient seen with increasing shortness of breath over the last week with purulent sputum production she saw her PCP who had her do symptomatic treatments.  She presented here due to increasing shortness of breath with dyspnea on exertion. No fevers, but decreased appetite.   In ED patient was found hypoxic, influenza A positive, with pneumonia, creatinine

## 2023-05-21 NOTE — Assessment & Plan Note (Addendum)
 History of carotid artery disease, peripheral vascular disease, - Currently stable, not complaining  Will continue Plavix, Lipitor, Plavix

## 2023-05-21 NOTE — Plan of Care (Signed)
  Problem: Education: Goal: Knowledge of General Education information will improve Description: Including pain rating scale, medication(s)/side effects and non-pharmacologic comfort measures Outcome: Progressing   Problem: Health Behavior/Discharge Planning: Goal: Ability to manage health-related needs will improve Outcome: Progressing   Problem: Clinical Measurements: Goal: Ability to maintain clinical measurements within normal limits will improve Outcome: Progressing Goal: Will remain free from infection Outcome: Progressing Goal: Diagnostic test results will improve Outcome: Progressing Goal: Respiratory complications will improve Outcome: Progressing Goal: Cardiovascular complication will be avoided Outcome: Progressing   Problem: Activity: Goal: Risk for activity intolerance will decrease Outcome: Progressing   Problem: Nutrition: Goal: Adequate nutrition will be maintained Outcome: Progressing   Problem: Coping: Goal: Level of anxiety will decrease Outcome: Progressing   Problem: Elimination: Goal: Will not experience complications related to bowel motility Outcome: Progressing Goal: Will not experience complications related to urinary retention Outcome: Progressing   Problem: Pain Managment: Goal: General experience of comfort will improve and/or be controlled Outcome: Progressing   Problem: Safety: Goal: Ability to remain free from injury will improve Outcome: Progressing   Problem: Skin Integrity: Goal: Risk for impaired skin integrity will decrease Outcome: Progressing   Problem: Education: Goal: Knowledge of disease or condition will improve Outcome: Progressing Goal: Knowledge of the prescribed therapeutic regimen will improve Outcome: Progressing Goal: Individualized Educational Video(s) Outcome: Progressing   Problem: Activity: Goal: Ability to tolerate increased activity will improve Outcome: Progressing Goal: Will verbalize the  importance of balancing activity with adequate rest periods Outcome: Progressing   Problem: Respiratory: Goal: Ability to maintain a clear airway will improve Outcome: Progressing Goal: Levels of oxygenation will improve Outcome: Progressing Goal: Ability to maintain adequate ventilation will improve Outcome: Progressing   Problem: Activity: Goal: Ability to tolerate increased activity will improve Outcome: Progressing   Problem: Clinical Measurements: Goal: Ability to maintain a body temperature in the normal range will improve Outcome: Progressing   Problem: Respiratory: Goal: Ability to maintain adequate ventilation will improve Outcome: Progressing Goal: Ability to maintain a clear airway will improve Outcome: Progressing   Problem: Education: Goal: Ability to describe self-care measures that may prevent or decrease complications (Diabetes Survival Skills Education) will improve Outcome: Progressing Goal: Individualized Educational Video(s) Outcome: Progressing   Problem: Coping: Goal: Ability to adjust to condition or change in health will improve Outcome: Progressing   Problem: Fluid Volume: Goal: Ability to maintain a balanced intake and output will improve Outcome: Progressing   Problem: Health Behavior/Discharge Planning: Goal: Ability to identify and utilize available resources and services will improve Outcome: Progressing Goal: Ability to manage health-related needs will improve Outcome: Progressing   Problem: Metabolic: Goal: Ability to maintain appropriate glucose levels will improve Outcome: Progressing   Problem: Nutritional: Goal: Maintenance of adequate nutrition will improve Outcome: Progressing Goal: Progress toward achieving an optimal weight will improve Outcome: Progressing   Problem: Skin Integrity: Goal: Risk for impaired skin integrity will decrease Outcome: Progressing   Problem: Tissue Perfusion: Goal: Adequacy of tissue  perfusion will improve Outcome: Progressing

## 2023-05-21 NOTE — Assessment & Plan Note (Signed)
-

## 2023-05-21 NOTE — Assessment & Plan Note (Signed)
-   Mood stable, continue home medication of Lexapro, and Xanax

## 2023-05-21 NOTE — Assessment & Plan Note (Signed)
 Continue atorvastatin

## 2023-05-22 DIAGNOSIS — J9601 Acute respiratory failure with hypoxia: Secondary | ICD-10-CM | POA: Diagnosis not present

## 2023-05-22 LAB — PROCALCITONIN: Procalcitonin: 0.15 ng/mL

## 2023-05-22 LAB — GLUCOSE, CAPILLARY
Glucose-Capillary: 259 mg/dL — ABNORMAL HIGH (ref 70–99)
Glucose-Capillary: 278 mg/dL — ABNORMAL HIGH (ref 70–99)
Glucose-Capillary: 218 mg/dL — ABNORMAL HIGH (ref 70–99)

## 2023-05-22 LAB — BASIC METABOLIC PANEL
Anion gap: 11 (ref 5–15)
BUN: 58 mg/dL — ABNORMAL HIGH (ref 8–23)
CO2: 21 mmol/L — ABNORMAL LOW (ref 22–32)
Calcium: 8.7 mg/dL — ABNORMAL LOW (ref 8.9–10.3)
Chloride: 103 mmol/L (ref 98–111)
Creatinine, Ser: 2.54 mg/dL — ABNORMAL HIGH (ref 0.44–1.00)
GFR, Estimated: 20 mL/min — ABNORMAL LOW (ref >60)
Glucose, Bld: 276 mg/dL — ABNORMAL HIGH (ref 70–99)
Potassium: 4.7 mmol/L (ref 3.5–5.1)
Sodium: 135 mmol/L (ref 135–145)

## 2023-05-22 LAB — CBC
HCT: 30.5 % — ABNORMAL LOW (ref 36.0–46.0)
Hemoglobin: 9.8 g/dL — ABNORMAL LOW (ref 12.0–15.0)
MCH: 29.3 pg (ref 26.0–34.0)
MCHC: 32.1 g/dL (ref 30.0–36.0)
MCV: 91.3 fL (ref 80.0–100.0)
Platelets: 325 10*3/uL (ref 150–400)
RBC: 3.34 MIL/uL — ABNORMAL LOW (ref 3.87–5.11)
RDW: 13.3 % (ref 11.5–15.5)
WBC: 11.1 10*3/uL — ABNORMAL HIGH (ref 4.0–10.5)
nRBC: 0 % (ref 0.0–0.2)

## 2023-05-22 MED ORDER — RISAQUAD PO CAPS: 2.0000 | ORAL_CAPSULE | Freq: Three times a day (TID) | ORAL | 0 refills | Status: AC

## 2023-05-22 MED ORDER — IPRATROPIUM-ALBUTEROL 0.5-2.5 (3) MG/3ML IN SOLN
3.0000 mL | Freq: Four times a day (QID) | RESPIRATORY_TRACT | Status: DC
  Administered 2023-05-22: 3 mL via RESPIRATORY_TRACT
  Filled 2023-05-22: qty 3

## 2023-05-22 MED ORDER — METHYLPREDNISOLONE 4 MG PO TBPK: ORAL_TABLET | ORAL | 0 refills | Status: DC

## 2023-05-22 MED ORDER — LEVOFLOXACIN 750 MG PO TABS: 750.0000 mg | ORAL_TABLET | Freq: Every day | ORAL | 0 refills | Status: DC

## 2023-05-22 MED ORDER — GUAIFENESIN-DM 100-10 MG/5ML PO SYRP: 10.0000 mL | ORAL_SOLUTION | Freq: Three times a day (TID) | ORAL | 0 refills | Status: DC

## 2023-05-22 MED ORDER — OSELTAMIVIR PHOSPHATE 30 MG PO CAPS
30.0000 mg | ORAL_CAPSULE | Freq: Every day | ORAL | Status: DC
  Administered 2023-05-22: 30 mg via ORAL
  Filled 2023-05-22: qty 1

## 2023-05-22 MED ORDER — SODIUM CHLORIDE 0.9 % IV SOLN: INTRAVENOUS | Status: DC

## 2023-05-22 MED ORDER — OSELTAMIVIR PHOSPHATE 30 MG PO CAPS: 30.0000 mg | ORAL_CAPSULE | Freq: Every day | ORAL | 0 refills | Status: DC

## 2023-05-22 NOTE — Discharge Summary (Signed)
 Physician Discharge Summary   Patient: Grace Hess MRN: [id number] DOB: [birth year]  Admit date:     05/20/2023  Discharge date: 05/22/23  Discharge Physician: Kendell Bane   PCP: Elfredia Nevins, MD   Recommendations at discharge:   -  Discharge Diagnoses: Principal Problem:   Acute respiratory failure with hypoxia [facility]) Active Problems:   Depression with anxiety   Essential hypertension, benign   GERD (gastroesophageal reflux disease)   Carotid artery disease (HCC)   Mixed hyperlipidemia   Uncontrolled type 2 diabetes mellitus with hyperglycemia, without long-term current use of insulin (HCC)   Renal cell cancer, left (HCC)   Iron deficiency anemia   Influenza A with pneumonia  Resolved Problems:   * No resolved hospital problems. *  Hospital Course: Grace Hess is a 68 y.o. female with medical history significant of renal cell carcinoma, coronary artery disease with stent placed in 2018, TIAs, GERD, depression, hypertension, COPD, type 2 diabetes.  Patient seen with increasing shortness of breath over the last week with purulent sputum production she saw her PCP who had her do symptomatic treatments.  She presented here due to increasing shortness of breath with dyspnea on exertion. No fevers, but decreased appetite.   In ED patient was found hypoxic, influenza A positive, with pneumonia, creatinine   * Acute respiratory failure with hypoxia (HCC) - Acute on chronic respiratory failure, with history of COPD, O2 dependent-exacerbated due to influenza, viral pneumonia -To demand improved to 1.5 L satting greater 92% (Patient is on 3 L at home) -Will continue supplemental oxygen, maintaining O2 sat greater than 92% -Continue DuoNeb bronchodilators -Initiating IV steroid >>> switch to p.o. steroid taper -Continue mucolytics   Influenza A with pneumonia - Influenza A positive Complicated by acute on chronic respiratory failure -Continue supportive therapy,  supplemental oxygen, DuoNeb, IV steroids, mucolytics - Continue Tamiflu -total of 5 days treatment  Iron deficiency anemia Stable  Renal cell cancer, left (HCC) - History of left renal cell carcinoma - Patient to follow-up with primary oncologist as an outpatient   With  Acute on chronic renal disease CKD stage IIIb BUN/creatinine back to baseline -Continue home medications:  sodium bicarb, calcium acetate,   Uncontrolled type 2 diabetes mellitus with hyperglycemia, without long-term current use of insulin (HCC) -Resume home diabetic medication, carb modified diet Anticipating few days of hypoglycemia as tapering down steroids   Mixed hyperlipidemia - Continue atorvastatin  Carotid artery disease (HCC) Will continue Plavix, Lipitor, Plavix  GERD (gastroesophageal reflux disease) - Continue Protonix  Essential hypertension, benign - History of hypertension currently not on any home BP meds -Blood pressure remained stable  Depression with anxiety -Continue home medication of Lexapro, and Xanax     Disposition: Home Diet recommendation:  Discharge Diet Orders (From admission, onward)     Start     Ordered   05/22/23 0000  Diet - low sodium heart healthy        05/22/23 1129           Regular diet DISCHARGE MEDICATION: Allergies as of 05/22/2023       Reactions   Codeine Shortness Of Breath   Sulfa Antibiotics Other (See Comments)   Unknown    Latex Itching        Medication List     STOP taking these medications    Accu-Chek Guide w/Device Kit   atorvastatin 40 MG tablet Commonly known as: LIPITOR   Biotin 5000 MCG Caps   doxycycline 100  MG capsule Commonly known as: VIBRAMYCIN       TAKE these medications    Accu-Chek Guide test strip Generic drug: glucose blood Use as instructed   acetaminophen 325 MG tablet Commonly known as: TYLENOL Take 2 tablets (650 mg total) by mouth every 4 (four) hours as needed for headache or mild  pain.   acidophilus Caps capsule Take 2 capsules by mouth 3 (three) times daily for 5 days.   albuterol 108 (90 Base) MCG/ACT inhaler Commonly known as: VENTOLIN HFA Inhale 1-2 puffs into the lungs every 4 (four) hours as needed for shortness of breath or wheezing.   ALPRAZolam 0.5 MG tablet Commonly known as: XANAX Take 0.25 mg by mouth 2 (two) times daily.   Breztri Aerosphere 160-9-4.8 MCG/ACT Aero Generic drug: Budeson-Glycopyrrol-Formoterol Inhale into the lungs. 1-2 times per day   calcitRIOL 0.25 MCG capsule Commonly known as: ROCALTROL Take 0.25 mcg by mouth every Monday, Wednesday, and Friday.   calcium acetate 667 MG tablet Commonly known as: PHOSLO Take 667 mg by mouth daily.   clopidogrel 75 MG tablet Commonly known as: PLAVIX Take 1 tablet (75 mg total) by mouth daily.   escitalopram 20 MG tablet Commonly known as: LEXAPRO Take 20 mg by mouth daily.   gabapentin 300 MG capsule Commonly known as: NEURONTIN Take 300 mg by mouth at bedtime.   guaiFENesin-dextromethorphan 100-10 MG/5ML syrup Commonly known as: ROBITUSSIN DM Take 10 mLs by mouth every 8 (eight) hours.   levofloxacin 750 MG tablet Commonly known as: Levaquin Take 1 tablet (750 mg total) by mouth daily for 3 days.   Lokelma 10 g Pack packet Generic drug: sodium zirconium cyclosilicate Take 10 g by mouth 3 (three) times a week.   methylPREDNISolone 4 MG Tbpk tablet Commonly known as: MEDROL DOSEPAK Medrol Dosepak take as instructed What changed:  how much to take how to take this when to take this additional instructions   NovoLOG FlexPen 100 UNIT/ML FlexPen Generic drug: insulin aspart Inject 5 Units into the skin 3 (three) times daily with meals.   nystatin cream Commonly known as: MYCOSTATIN Apply 1 application  topically 2 (two) times daily as needed (Itching).   ondansetron 4 MG tablet Commonly known as: ZOFRAN TAKE 1 TABLET(4 MG) BY MOUTH TWICE DAILY AS NEEDED FOR NAUSEA  OR VOMITING What changed:  how much to take how to take this when to take this reasons to take this additional instructions   oseltamivir 30 MG capsule Commonly known as: TAMIFLU Take 1 capsule (30 mg total) by mouth daily for 3 days. Start taking on: May 23, 2023   oxyCODONE-acetaminophen 5-325 MG tablet Commonly known as: PERCOCET/ROXICET Take 1 tablet by mouth every 6 (six) hours as needed for severe pain or moderate pain.   pantoprazole 40 MG tablet Commonly known as: PROTONIX Take 1 tablet (40 mg total) by mouth daily.   Pen Needles 31G X 6 MM Misc Use to inject insulin once daily   promethazine 25 MG tablet Commonly known as: PHENERGAN Take 25 mg by mouth 4 (four) times daily as needed.   rosuvastatin 20 MG tablet Commonly known as: CRESTOR Take 20 mg by mouth at bedtime.   sodium bicarbonate 650 MG tablet Take 650 mg by mouth 2 (two) times daily.   Evaristo Bury FlexTouch 100 UNIT/ML FlexTouch Pen Generic drug: insulin degludec Inject 10 Units into the skin at bedtime.   Vitamin D3 125 MCG (5000 UT) Caps Take 5,000 Units by mouth once a week.  Tuesday        Discharge Exam: Filed Weights   05/20/23 1314 05/20/23 2003  Weight: 62.1 kg 61.9 kg        General:  AAO x 3,  cooperative, no distress;   HEENT:  Normocephalic, PERRL, otherwise with in Normal limits   Neuro:  CNII-XII intact. , normal motor and sensation, reflexes intact   Lungs:   Clear to auscultation BL, Respirations unlabored,  No wheezes / crackles  Cardio:    S1/S2, RRR, No murmure, No Rubs or Gallops   Abdomen:  Soft, non-tender, bowel sounds active all four quadrants, no guarding or peritoneal signs.  Muscular  skeletal:  Limited exam -global generalized weaknesses - in bed, able to move all 4 extremities,   2+ pulses,  symmetric, No pitting edema  Skin:  Dry, warm to touch, negative for any Rashes,  Wounds: Please see nursing documentation          Condition at discharge:  good  The results of significant diagnostics from this hospitalization (including imaging, microbiology, ancillary and laboratory) are listed below for reference.   Imaging Studies: DG Chest 2 View Result Date: 05/20/2023 CLINICAL DATA:  Cough and runny nose for 3-4 days. History of renal cell carcinoma EXAM: CHEST - 2 VIEW COMPARISON:  X-ray 11/28/2021.  CT 01/10/2023. FINDINGS: Stable mildly enlarged cardiac silhouette. No pneumothorax, edema. Mild interstitial prominence, unchanged from previous and likely chronic. Calcified aorta. Degenerative changes along the spine. Question tiny pleural effusions. IMPRESSION: Tiny pleural effusions.  Slight interstitial prominence. Electronically Signed   By: Karen Kays M.D.   On: 05/20/2023 17:14   DG Chest Portable 1 View Result Date: 05/20/2023 CLINICAL DATA:  Shortness of breath and coughing. History of renal cell carcinoma. EXAM: PORTABLE CHEST 1 VIEW COMPARISON:  X-ray 05/19/2023 and older.  CT 01/10/23 FINDINGS: Stable cardiopericardial silhouette. Calcified aorta. There is increasing interstitial changes in the lungs with tiny effusions and some subtle areas of lung nodularity. Surgical clips in the right upper quadrant of the abdomen. Osteopenia and degenerative changes. IMPRESSION: Increasing interstitial changes the lungs with some subtle nodular areas and trace pleural fluid. Acute process is possible. Recommend short follow-up. Electronically Signed   By: Karen Kays M.D.   On: 05/20/2023 17:12    Microbiology: Results for orders placed or performed during the hospital encounter of 05/20/23  Resp panel by RT-PCR (RSV, Flu A&B, Covid) Anterior Nasal Swab     Status: Abnormal   Collection Time: 05/20/23  1:19 PM   Specimen: Anterior Nasal Swab  Result Value Ref Range Status   SARS Coronavirus 2 by RT PCR NEGATIVE NEGATIVE Final    Comment: (NOTE) SARS-CoV-2 target nucleic acids are NOT DETECTED.  The SARS-CoV-2 RNA is generally detectable in  upper respiratory specimens during the acute phase of infection. The lowest concentration of SARS-CoV-2 viral copies this assay can detect is 138 copies/mL. A negative result does not preclude SARS-Cov-2 infection and should not be used as the sole basis for treatment or other patient management decisions. A negative result may occur with  improper specimen collection/handling, submission of specimen other than nasopharyngeal swab, presence of viral mutation(s) within the areas targeted by this assay, and inadequate number of viral copies(<138 copies/mL). A negative result must be combined with clinical observations, patient history, and epidemiological information. The expected result is Negative.  Fact Sheet for Patients:  BloggerCourse.com  Fact Sheet for Healthcare Providers:  SeriousBroker.it  This test is no t yet approved or  cleared by the [place] and  has been authorized for detection and/or diagnosis of SARS-CoV-2 by FDA under an Emergency Use Authorization (EUA). This EUA will remain  in effect (meaning this test can be used) for the duration of the COVID-19 declaration under Section 564(b)(1) of the Act, 21 U.S.C.section 360bbb-3(b)(1), unless the authorization is terminated  or revoked sooner.       Influenza A by PCR POSITIVE (A) NEGATIVE Final   Influenza B by PCR NEGATIVE NEGATIVE Final    Comment: (NOTE) The Xpert Xpress SARS-CoV-2/FLU/RSV plus assay is intended as an aid in the diagnosis of influenza from Nasopharyngeal swab specimens and should not be used as a sole basis for treatment. Nasal washings and aspirates are unacceptable for Xpert Xpress SARS-CoV-2/FLU/RSV testing.  Fact Sheet for Patients: BloggerCourse.com  Fact Sheet for Healthcare Providers: SeriousBroker.it  This test is not yet approved or cleared by the [place] FDA and has  been authorized for detection and/or diagnosis of SARS-CoV-2 by FDA under an Emergency Use Authorization (EUA). This EUA will remain in effect (meaning this test can be used) for the duration of the COVID-19 declaration under Section 564(b)(1) of the Act, 21 U.S.C. section 360bbb-3(b)(1), unless the authorization is terminated or revoked.     Resp Syncytial Virus by PCR NEGATIVE NEGATIVE Final    Comment: (NOTE) Fact Sheet for Patients: BloggerCourse.com  Fact Sheet for Healthcare Providers: SeriousBroker.it  This test is not yet approved or cleared by the [place] FDA and has been authorized for detection and/or diagnosis of SARS-CoV-2 by FDA under an Emergency Use Authorization (EUA). This EUA will remain in effect (meaning this test can be used) for the duration of the COVID-19 declaration under Section 564(b)(1) of the Act, 21 U.S.C. section 360bbb-3(b)(1), unless the authorization is terminated or revoked.  Performed at [facility], [address] [place], [place] [address]   Blood Culture (routine x 2)     Status: None (Preliminary result)   Collection Time: 05/20/23  5:00 PM   Specimen: BLOOD  Result Value Ref Range Status   Specimen Description BLOOD BLOOD RIGHT ARM  Final   Special Requests NONE  Final   Culture   Final    NO GROWTH 2 DAYS Performed at [facility], [address] [place], [place] [address]    Report Status PENDING  Incomplete  Blood Culture (routine x 2)     Status: None (Preliminary result)   Collection Time: 05/20/23  5:40 PM   Specimen: BLOOD  Result Value Ref Range Status   Specimen Description BLOOD BLOOD LEFT ARM  Final   Special Requests NONE  Final   Culture   Final    NO GROWTH 2 DAYS Performed at [facility], [address], [place], [place] [address]    Report Status PENDING  Incomplete    Labs: CBC: Recent Labs  Lab 05/20/23 1405 05/21/23 0442 05/22/23 0432  WBC 14.3*  10.7* 11.1*  HGB 11.3* 10.0* 9.8*  HCT 35.6* 31.6* 30.5*  MCV 92.7 91.3 91.3  PLT 378 335 325   Basic Metabolic Panel: Recent Labs  Lab 05/20/23 1405 05/21/23 0442 05/22/23 0432  NA 135 134* 135  K 3.8 4.8 4.7  CL 100 108 103  CO2 24 21* 21*  GLUCOSE 121* 194* 276*  BUN 66* 62* 58*  CREATININE 2.91* 2.46* 2.54*  CALCIUM 9.1 8.2* 8.7*   Liver Function Tests: Recent Labs  Lab 05/20/23 1405  AST 22  ALT 16  ALKPHOS 83  BILITOT 0.4  PROT 6.6  ALBUMIN 2.8*   CBG: Recent Labs  Lab 05/21/23 1614 05/21/23 2118 05/22/23 0627 05/22/23 0715 05/22/23 1119  GLUCAP 265* 232* 259* 278* 218*    Discharge time spent: greater than 30 minutes.  Signed: Kendell Bane, MD [facility] 05/22/2023

## 2023-05-22 NOTE — Plan of Care (Signed)
  Problem: Education: Goal: Knowledge of General Education information will improve Description: Including pain rating scale, medication(s)/side effects and non-pharmacologic comfort measures Outcome: Progressing   Problem: Health Behavior/Discharge Planning: Goal: Ability to manage health-related needs will improve Outcome: Progressing   Problem: Clinical Measurements: Goal: Ability to maintain clinical measurements within normal limits will improve Outcome: Progressing Goal: Will remain free from infection Outcome: Progressing Goal: Diagnostic test results will improve Outcome: Progressing Goal: Respiratory complications will improve Outcome: Progressing Goal: Cardiovascular complication will be avoided Outcome: Progressing   Problem: Activity: Goal: Risk for activity intolerance will decrease Outcome: Progressing   Problem: Nutrition: Goal: Adequate nutrition will be maintained Outcome: Progressing   Problem: Coping: Goal: Level of anxiety will decrease Outcome: Progressing   Problem: Elimination: Goal: Will not experience complications related to bowel motility Outcome: Progressing Goal: Will not experience complications related to urinary retention Outcome: Progressing   Problem: Pain Managment: Goal: General experience of comfort will improve and/or be controlled Outcome: Progressing   Problem: Safety: Goal: Ability to remain free from injury will improve Outcome: Progressing   Problem: Skin Integrity: Goal: Risk for impaired skin integrity will decrease Outcome: Progressing   Problem: Education: Goal: Knowledge of disease or condition will improve Outcome: Progressing Goal: Knowledge of the prescribed therapeutic regimen will improve Outcome: Progressing Goal: Individualized Educational Video(s) Outcome: Progressing   Problem: Activity: Goal: Ability to tolerate increased activity will improve Outcome: Progressing Goal: Will verbalize the  importance of balancing activity with adequate rest periods Outcome: Progressing   Problem: Respiratory: Goal: Ability to maintain a clear airway will improve Outcome: Progressing Goal: Levels of oxygenation will improve Outcome: Progressing Goal: Ability to maintain adequate ventilation will improve Outcome: Progressing   Problem: Activity: Goal: Ability to tolerate increased activity will improve Outcome: Progressing   Problem: Clinical Measurements: Goal: Ability to maintain a body temperature in the normal range will improve Outcome: Progressing   Problem: Respiratory: Goal: Ability to maintain adequate ventilation will improve Outcome: Progressing Goal: Ability to maintain a clear airway will improve Outcome: Progressing   Problem: Education: Goal: Ability to describe self-care measures that may prevent or decrease complications (Diabetes Survival Skills Education) will improve Outcome: Progressing Goal: Individualized Educational Video(s) Outcome: Progressing   Problem: Coping: Goal: Ability to adjust to condition or change in health will improve Outcome: Progressing   Problem: Fluid Volume: Goal: Ability to maintain a balanced intake and output will improve Outcome: Progressing   Problem: Health Behavior/Discharge Planning: Goal: Ability to identify and utilize available resources and services will improve Outcome: Progressing Goal: Ability to manage health-related needs will improve Outcome: Progressing   Problem: Metabolic: Goal: Ability to maintain appropriate glucose levels will improve Outcome: Progressing   Problem: Nutritional: Goal: Maintenance of adequate nutrition will improve Outcome: Progressing Goal: Progress toward achieving an optimal weight will improve Outcome: Progressing   Problem: Skin Integrity: Goal: Risk for impaired skin integrity will decrease Outcome: Progressing   Problem: Tissue Perfusion: Goal: Adequacy of tissue  perfusion will improve Outcome: Progressing

## 2023-05-24 ENCOUNTER — Emergency Department (HOSPITAL_COMMUNITY)

## 2023-05-24 ENCOUNTER — Encounter (HOSPITAL_COMMUNITY): Payer: Self-pay | Admitting: Emergency Medicine

## 2023-05-24 ENCOUNTER — Observation Stay (HOSPITAL_COMMUNITY)
Admission: EM | Admit: 2023-05-24 | Discharge: 2023-05-25 | Disposition: A | Discharge status: Home / Self Care | Attending: Internal Medicine | Admitting: Internal Medicine

## 2023-05-24 ENCOUNTER — Other Ambulatory Visit: Payer: Self-pay

## 2023-05-24 DIAGNOSIS — N1832 Chronic kidney disease, stage 3b: Secondary | ICD-10-CM | POA: Insufficient documentation

## 2023-05-24 DIAGNOSIS — I10 Essential (primary) hypertension: Secondary | ICD-10-CM | POA: Diagnosis present

## 2023-05-24 DIAGNOSIS — R531 Weakness: Principal | ICD-10-CM | POA: Insufficient documentation

## 2023-05-24 DIAGNOSIS — Z91040 Latex allergy status: Secondary | ICD-10-CM | POA: Insufficient documentation

## 2023-05-24 DIAGNOSIS — R519 Headache, unspecified: Principal | ICD-10-CM | POA: Diagnosis present

## 2023-05-24 DIAGNOSIS — J111 Influenza due to unidentified influenza virus with other respiratory manifestations: Secondary | ICD-10-CM | POA: Diagnosis present

## 2023-05-24 DIAGNOSIS — Z1152 Encounter for screening for COVID-19: Secondary | ICD-10-CM | POA: Diagnosis not present

## 2023-05-24 DIAGNOSIS — I129 Hypertensive chronic kidney disease with stage 1 through stage 4 chronic kidney disease, or unspecified chronic kidney disease: Secondary | ICD-10-CM | POA: Insufficient documentation

## 2023-05-24 DIAGNOSIS — R059 Cough, unspecified: Secondary | ICD-10-CM | POA: Diagnosis not present

## 2023-05-24 DIAGNOSIS — E1122 Type 2 diabetes mellitus with diabetic chronic kidney disease: Secondary | ICD-10-CM | POA: Diagnosis not present

## 2023-05-24 DIAGNOSIS — I6782 Cerebral ischemia: Secondary | ICD-10-CM | POA: Diagnosis not present

## 2023-05-24 DIAGNOSIS — Z955 Presence of coronary angioplasty implant and graft: Secondary | ICD-10-CM | POA: Diagnosis not present

## 2023-05-24 DIAGNOSIS — Z8673 Personal history of transient ischemic attack (TIA), and cerebral infarction without residual deficits: Secondary | ICD-10-CM | POA: Insufficient documentation

## 2023-05-24 DIAGNOSIS — R112 Nausea with vomiting, unspecified: Secondary | ICD-10-CM

## 2023-05-24 DIAGNOSIS — E1165 Type 2 diabetes mellitus with hyperglycemia: Secondary | ICD-10-CM

## 2023-05-24 DIAGNOSIS — Z7902 Long term (current) use of antithrombotics/antiplatelets: Secondary | ICD-10-CM | POA: Diagnosis not present

## 2023-05-24 DIAGNOSIS — Z794 Long term (current) use of insulin: Secondary | ICD-10-CM | POA: Insufficient documentation

## 2023-05-24 DIAGNOSIS — Z79899 Other long term (current) drug therapy: Secondary | ICD-10-CM | POA: Diagnosis not present

## 2023-05-24 DIAGNOSIS — I2510 Atherosclerotic heart disease of native coronary artery without angina pectoris: Secondary | ICD-10-CM | POA: Diagnosis not present

## 2023-05-24 DIAGNOSIS — E860 Dehydration: Secondary | ICD-10-CM | POA: Diagnosis not present

## 2023-05-24 DIAGNOSIS — I700 Atherosclerosis of aorta: Secondary | ICD-10-CM | POA: Diagnosis not present

## 2023-05-24 DIAGNOSIS — Z85528 Personal history of other malignant neoplasm of kidney: Secondary | ICD-10-CM | POA: Diagnosis not present

## 2023-05-24 DIAGNOSIS — J449 Chronic obstructive pulmonary disease, unspecified: Secondary | ICD-10-CM | POA: Insufficient documentation

## 2023-05-24 DIAGNOSIS — R197 Diarrhea, unspecified: Secondary | ICD-10-CM | POA: Diagnosis present

## 2023-05-24 DIAGNOSIS — F17210 Nicotine dependence, cigarettes, uncomplicated: Secondary | ICD-10-CM | POA: Diagnosis not present

## 2023-05-24 HISTORY — DX: Other specified disorders of kidney and ureter: N28.89

## 2023-05-24 LAB — CBC
HCT: 38.3 % (ref 36.0–46.0)
Hemoglobin: 12.3 g/dL (ref 12.0–15.0)
MCH: 29.3 pg (ref 26.0–34.0)
MCHC: 32.1 g/dL (ref 30.0–36.0)
MCV: 91.2 fL (ref 80.0–100.0)
Platelets: 385 10*3/uL (ref 150–400)
RBC: 4.2 MIL/uL (ref 3.87–5.11)
RDW: 13.2 % (ref 11.5–15.5)
WBC: 12.7 10*3/uL — ABNORMAL HIGH (ref 4.0–10.5)
nRBC: 0 % (ref 0.0–0.2)

## 2023-05-24 LAB — COMPREHENSIVE METABOLIC PANEL
ALT: 17 U/L (ref 0–44)
AST: 15 U/L (ref 15–41)
Albumin: 2.9 g/dL — ABNORMAL LOW (ref 3.5–5.0)
Alkaline Phosphatase: 65 U/L (ref 38–126)
Anion gap: 12 (ref 5–15)
BUN: 52 mg/dL — ABNORMAL HIGH (ref 8–23)
CO2: 21 mmol/L — ABNORMAL LOW (ref 22–32)
Calcium: 9 mg/dL (ref 8.9–10.3)
Chloride: 103 mmol/L (ref 98–111)
Creatinine, Ser: 2.48 mg/dL — ABNORMAL HIGH (ref 0.44–1.00)
GFR, Estimated: 21 mL/min — ABNORMAL LOW (ref >60)
Glucose, Bld: 167 mg/dL — ABNORMAL HIGH (ref 70–99)
Potassium: 4.6 mmol/L (ref 3.5–5.1)
Sodium: 136 mmol/L (ref 135–145)
Total Bilirubin: 1.1 mg/dL (ref 0.0–1.2)
Total Protein: 6.5 g/dL (ref 6.5–8.1)

## 2023-05-24 LAB — URINALYSIS, ROUTINE W REFLEX MICROSCOPIC
Bilirubin Urine: NEGATIVE
Glucose, UA: 150 mg/dL — AB
Ketones, ur: 5 mg/dL — AB
Nitrite: NEGATIVE
Protein, ur: 100 mg/dL — AB
Specific Gravity, Urine: 1.016 (ref 1.005–1.030)
pH: 6 (ref 5.0–8.0)

## 2023-05-24 LAB — LEGIONELLA PNEUMOPHILA SEROGP 1 UR AG: L. pneumophila Serogp 1 Ur Ag: NEGATIVE

## 2023-05-24 LAB — RESP PANEL BY RT-PCR (RSV, FLU A&B, COVID)  RVPGX2
Influenza A by PCR: NEGATIVE
Influenza B by PCR: NEGATIVE
Resp Syncytial Virus by PCR: NEGATIVE
SARS Coronavirus 2 by RT PCR: NEGATIVE

## 2023-05-24 LAB — GLUCOSE, CAPILLARY
Glucose-Capillary: 113 mg/dL — ABNORMAL HIGH (ref 70–99)
Glucose-Capillary: 113 mg/dL — ABNORMAL HIGH (ref 70–99)

## 2023-05-24 LAB — SEDIMENTATION RATE: Sed Rate: 27 mm/h — ABNORMAL HIGH (ref 0–22)

## 2023-05-24 MED ORDER — ALPRAZOLAM 0.25 MG PO TABS
0.2500 mg | ORAL_TABLET | Freq: Two times a day (BID) | ORAL | Status: DC | PRN
  Administered 2023-05-24: 0.25 mg via ORAL
  Filled 2023-05-24: qty 1

## 2023-05-24 MED ORDER — PANTOPRAZOLE SODIUM 20 MG PO TBEC
20.0000 mg | DELAYED_RELEASE_TABLET | Freq: Every day | ORAL | Status: DC
  Filled 2023-05-24 (×2): qty 1

## 2023-05-24 MED ORDER — SENNOSIDES-DOCUSATE SODIUM 8.6-50 MG PO TABS: 1.0000 | ORAL_TABLET | Freq: Every evening | ORAL | Status: DC | PRN

## 2023-05-24 MED ORDER — LACTATED RINGERS IV BOLUS
1000.0000 mL | Freq: Once | INTRAVENOUS | Status: AC
  Administered 2023-05-24: 1000 mL via INTRAVENOUS

## 2023-05-24 MED ORDER — HEPARIN SODIUM (PORCINE) 5000 UNIT/ML IJ SOLN
5000.0000 [IU] | Freq: Three times a day (TID) | INTRAMUSCULAR | Status: DC
  Administered 2023-05-24 – 2023-05-25 (×3): 5000 [IU] via SUBCUTANEOUS
  Filled 2023-05-24 (×3): qty 1

## 2023-05-24 MED ORDER — FLUTICASONE FUROATE-VILANTEROL 200-25 MCG/ACT IN AEPB
1.0000 | INHALATION_SPRAY | Freq: Every day | RESPIRATORY_TRACT | Status: DC
  Administered 2023-05-25: 1 via RESPIRATORY_TRACT
  Filled 2023-05-24: qty 28

## 2023-05-24 MED ORDER — ACETAMINOPHEN 650 MG RE SUPP: 650.0000 mg | RECTAL | Status: DC | PRN

## 2023-05-24 MED ORDER — SODIUM ZIRCONIUM CYCLOSILICATE 5 G PO PACK: 10.0000 g | PACK | ORAL | Status: DC

## 2023-05-24 MED ORDER — ACETAMINOPHEN 500 MG PO TABS
1000.0000 mg | ORAL_TABLET | Freq: Once | ORAL | Status: AC
Start: 2023-05-24 — End: 2023-05-24
  Administered 2023-05-24: 1000 mg via ORAL
  Filled 2023-05-24: qty 2

## 2023-05-24 MED ORDER — STROKE: EARLY STAGES OF RECOVERY BOOK
Freq: Once | Status: AC
  Filled 2023-05-24: qty 1

## 2023-05-24 MED ORDER — ACETAMINOPHEN 160 MG/5ML PO SOLN: 650.0000 mg | ORAL | Status: DC | PRN

## 2023-05-24 MED ORDER — CALCIUM ACETATE (PHOS BINDER) 667 MG PO CAPS
667.0000 mg | ORAL_CAPSULE | Freq: Three times a day (TID) | ORAL | Status: DC
  Administered 2023-05-25 (×2): 667 mg via ORAL
  Filled 2023-05-24 (×2): qty 1

## 2023-05-24 MED ORDER — METOCLOPRAMIDE HCL 5 MG/ML IJ SOLN
10.0000 mg | Freq: Once | INTRAMUSCULAR | Status: AC
  Administered 2023-05-24: 10 mg via INTRAVENOUS
  Filled 2023-05-24: qty 2

## 2023-05-24 MED ORDER — CLOPIDOGREL BISULFATE 75 MG PO TABS
75.0000 mg | ORAL_TABLET | Freq: Every day | ORAL | Status: DC
  Administered 2023-05-25: 75 mg via ORAL
  Filled 2023-05-24: qty 1

## 2023-05-24 MED ORDER — INSULIN ASPART 100 UNIT/ML IJ SOLN
0.0000 [IU] | Freq: Three times a day (TID) | INTRAMUSCULAR | Status: DC
  Administered 2023-05-25 (×2): 2 [IU] via SUBCUTANEOUS

## 2023-05-24 MED ORDER — SODIUM ZIRCONIUM CYCLOSILICATE 10 G PO PACK
10.0000 g | PACK | ORAL | Status: DC
  Administered 2023-05-25: 10 g via ORAL
  Filled 2023-05-24: qty 1

## 2023-05-24 MED ORDER — INSULIN ASPART 100 UNIT/ML IJ SOLN: 0.0000 [IU] | Freq: Every day | INTRAMUSCULAR | Status: DC

## 2023-05-24 MED ORDER — ACETAMINOPHEN 325 MG PO TABS: 650.0000 mg | ORAL_TABLET | ORAL | Status: DC | PRN

## 2023-05-24 NOTE — ED Provider Notes (Signed)
 [place] EMERGENCY DEPARTMENT AT [facility] Provider Note   CSN: [id number] Arrival date & time: 05/24/23  7829     History  Chief Complaint  Patient presents with   Influenza    Grace Hess is a 68 y.o. female.  HPI     68 y.o. female with medical history significant of renal cell carcinoma, coronary artery disease with stent placed in 2018, TIAs, GERD, depression, hypertension, COPD, type 2 diabetes comes to the ER with chief complaint of worsening weakness, headaches.  According to the patient's husband, patient was profoundly weak after being discharged from the hospital for flu 2 days ago.  She was unable to ambulate at home.  Patient is complaining of worsening headache, which prompted her to come to the ER.  Patient's breathing has improved.  She denies any new fevers.  No falls at home.  Review of system is positive for some urinary discomfort and also loose bowel movements starting today.  Home Medications Prior to Admission medications   Medication Sig Start Date End Date Taking? Authorizing Provider  acetaminophen (TYLENOL) 325 MG tablet Take 2 tablets (650 mg total) by mouth every 4 (four) hours as needed for headache or mild pain. 08/03/16   Abelino Derrick, PA-C  acidophilus (RISAQUAD) CAPS capsule Take 2 capsules by mouth 3 (three) times daily for 5 days. 05/22/23 05/27/23  Shahmehdi, Gemma Payor, MD  albuterol (VENTOLIN HFA) 108 (90 Base) MCG/ACT inhaler Inhale 1-2 puffs into the lungs every 4 (four) hours as needed for shortness of breath or wheezing.    [provider]  ALPRAZolam Prudy Feeler) 0.5 MG tablet Take 0.25 mg by mouth 2 (two) times daily. 03/20/19   [provider]  Budeson-Glycopyrrol-Formoterol (BREZTRI AEROSPHERE) 160-9-4.8 MCG/ACT AERO Inhale into the lungs. 1-2 times per day    [provider]  calcitRIOL (ROCALTROL) 0.25 MCG capsule Take 0.25 mcg by mouth every Monday, Wednesday, and Friday. 06/14/22 06/14/23  [provider]  calcium acetate (PHOSLO) 667 MG tablet Take 667 mg by mouth daily. 06/14/22 06/14/23  [provider]  Cholecalciferol (VITAMIN D3) 125 MCG (5000 UT) CAPS Take 5,000 Units by mouth once a week. Tuesday    [provider]  clopidogrel (PLAVIX) 75 MG tablet Take 1 tablet (75 mg total) by mouth daily. 12/03/14   Rai, Ripudeep K, MD  escitalopram (LEXAPRO) 20 MG tablet Take 20 mg by mouth daily. 01/01/21   [provider]  gabapentin (NEURONTIN) 300 MG capsule Take 300 mg by mouth at bedtime.    [provider]  glucose blood (ACCU-CHEK GUIDE) test strip Use as instructed 08/23/19   Roma Kayser, MD  guaiFENesin-dextromethorphan (ROBITUSSIN DM) 100-10 MG/5ML syrup Take 10 mLs by mouth every 8 (eight) hours. 05/22/23   Shahmehdi, Gemma Payor, MD  insulin aspart (NOVOLOG FLEXPEN) 100 UNIT/ML FlexPen Inject 5 Units into the skin 3 (three) times daily with meals. 05/12/23   Dani Gobble, NP  insulin degludec (TRESIBA FLEXTOUCH) 100 UNIT/ML FlexTouch Pen Inject 10 Units into the skin at bedtime. 05/10/23   Dani Gobble, NP  Insulin Pen Needle (PEN NEEDLES) 31G X 6 MM MISC Use to inject insulin once daily 05/10/23   Dani Gobble, NP  levofloxacin (LEVAQUIN) 750 MG tablet Take 1 tablet (750 mg total) by mouth daily for 3 days. 05/22/23 05/25/23  Shahmehdi, Gemma Payor, MD  LOKELMA 10 g PACK packet Take 10 g by mouth 3 (three) times a week. 07/14/22  [provider]  methylPREDNISolone (MEDROL DOSEPAK) 4 MG TBPK tablet Medrol Dosepak take as instructed 05/22/23   Shahmehdi, Guadalupe Maple A, MD  nystatin cream (MYCOSTATIN) Apply 1 application  topically 2 (two) times daily as needed (Itching). 07/10/16   [provider]  ondansetron (ZOFRAN) 4 MG tablet TAKE 1 TABLET(4 MG) BY MOUTH TWICE DAILY AS NEEDED FOR NAUSEA OR VOMITING Patient taking differently: Take 4 mg by mouth every 8 (eight) hours as needed for nausea. 10/29/21   Dolores Frame,  MD  oseltamivir (TAMIFLU) 30 MG capsule Take 1 capsule (30 mg total) by mouth daily for 3 days. 05/23/23 05/26/23  Kendell Bane, MD  oxyCODONE-acetaminophen (PERCOCET/ROXICET) 5-325 MG tablet Take 1 tablet by mouth every 6 (six) hours as needed for severe pain or moderate pain. 08/05/22   Karie Soda, MD  pantoprazole (PROTONIX) 40 MG tablet Take 1 tablet (40 mg total) by mouth daily. 11/02/22   Carlan, Jeral Pinch, NP  promethazine (PHENERGAN) 25 MG tablet Take 25 mg by mouth 4 (four) times daily as needed. 12/17/21   [provider]  rosuvastatin (CRESTOR) 20 MG tablet Take 20 mg by mouth at bedtime. 05/20/23   [provider]  sodium bicarbonate 650 MG tablet Take 650 mg by mouth 2 (two) times daily. 06/14/22   [provider]      Allergies    Codeine, Sulfa antibiotics, and Latex    Review of Systems   Review of Systems  All other systems reviewed and are negative.   Physical Exam Updated Vital Signs BP (!) 152/68 (BP Location: Left Arm)   Pulse 65   Temp 98.6 F (37 C) (Oral)   Resp 16   Ht 5\' 5"  (1.651 m)   Wt 62.1 kg   SpO2 96%   BMI 22.80 kg/m  Physical Exam Vitals and nursing note reviewed.  Constitutional:      Appearance: She is well-developed.  HENT:     Head: Atraumatic.  Eyes:     Extraocular Movements: Extraocular movements intact.     Pupils: Pupils are equal, round, and reactive to light.  Cardiovascular:     Rate and Rhythm: Normal rate.  Pulmonary:     Effort: Pulmonary effort is normal.  Abdominal:     Tenderness: There is no abdominal tenderness.  Musculoskeletal:     Cervical back: Normal range of motion and neck supple.  Skin:    General: Skin is warm and dry.  Neurological:     General: No focal deficit present.     Mental Status: She is alert and oriented to person, place, and time.     ED Results / Procedures / Treatments   Labs (all labs ordered are listed, but only abnormal results are displayed) Labs  Reviewed  CBC - Abnormal; Notable for the following components:      Result Value   WBC 12.7 (*)    All other components within normal limits  COMPREHENSIVE METABOLIC PANEL - Abnormal; Notable for the following components:   CO2 21 (*)    Glucose, Bld 167 (*)    BUN 52 (*)    Creatinine, Ser 2.48 (*)    Albumin 2.9 (*)    GFR, Estimated 21 (*)    All other components within normal limits  URINALYSIS, ROUTINE W REFLEX MICROSCOPIC - Abnormal; Notable for the following components:   APPearance HAZY (*)    Glucose, UA 150 (*)    Hgb urine dipstick SMALL (*)  Ketones, ur 5 (*)    Protein, ur 100 (*)    Leukocytes,Ua MODERATE (*)    Bacteria, UA RARE (*)    All other components within normal limits  RESP PANEL BY RT-PCR (RSV, FLU A&B, COVID)  [id number]  URINE CULTURE  GASTROINTESTINAL PANEL BY PCR, STOOL (REPLACES STOOL CULTURE)    EKG None  Radiology CT Head Wo Contrast Result Date: 05/24/2023 CLINICAL DATA:  Headache, new onset (Age >= 51y). EXAM: CT HEAD WITHOUT CONTRAST TECHNIQUE: Contiguous axial images were obtained from the base of the skull through the vertex without intravenous contrast. RADIATION DOSE REDUCTION: This exam was performed according to the departmental dose-optimization program which includes automated exposure control, adjustment of the mA and/or kV according to patient size and/or use of iterative reconstruction technique. COMPARISON:  CT head 12/10/2020 FINDINGS: Brain: There is no evidence of an acute large territory infarct, intracranial hemorrhage, extra-axial fluid collection, mass, or midline shift. A subcentimeter hypodensity posteriorly in the left cerebellar hemisphere was not evident on the prior CT and could reflect an age indeterminate infarct or artifact. Mild cerebral atrophy has progressed. Cerebral white matter hypodensities have also likely mildly progressed and are nonspecific but compatible with mild chronic small vessel ischemic disease. Vascular:  Calcified atherosclerosis at the skull base. Skull: No acute fracture or suspicious lesion. Sinuses/Orbits: Mild mucosal thickening in the included portions of the paranasal sinuses. Small left mastoid effusion. Unremarkable orbits. Other: None. IMPRESSION: 1. No evidence of acute intracranial hemorrhage or large territory infarct. 2. 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 3. Mild chronic small vessel ischemic disease. Electronically Signed   By: Sebastian Ache M.D.   On: 05/24/2023 18:34   DG Chest Portable 1 View Result Date: 05/24/2023 CLINICAL DATA:  Cough. EXAM: PORTABLE CHEST 1 VIEW COMPARISON:  Chest radiograph dated 05/20/2023. FINDINGS: There is mild chronic antral coarsening. No focal consolidation, pleural effusion, or pneumothorax. Stable cardiac silhouette. Atherosclerotic calcification of the aortic arch. No acute osseous pathology. IMPRESSION: No active disease. Electronically Signed   By: Elgie Collard M.D.   On: 05/24/2023 12:11    Procedures Procedures    Medications Ordered in ED Medications  lactated ringers bolus 1,000 mL (1,000 mLs Intravenous Bolus 05/24/23 1740)  metoCLOPramide (REGLAN) injection 10 mg (10 mg Intravenous Given 05/24/23 1740)  acetaminophen (TYLENOL) tablet 1,000 mg (1,000 mg Oral Given 05/24/23 1731)    ED Course/ Medical Decision Making/ A&P                                 Medical Decision Making Amount and/or Complexity of Data Reviewed Labs: ordered. Radiology: ordered.  Risk OTC drugs. Prescription drug management. Decision regarding hospitalization.   68 year old patient comes in with chief complaint of worsening weakness. Patient was released from the hospital 2 days ago after being admitted to the hospital for influenza.  It appears that patient continues to have poor p.o. intake, and her weakness has worsened.  She also is having worsening headache.  Differential diagnosis for this patient includes  worsening renal failure, severe electrolyte abnormality, deconditioning from the admission, post influenza decompensation.  Low suspicion for rhabdo myelosis or anemia.  CT scan of the brain will be ordered for the headache she is experiencing. Pt has no associated nausea, vomiting, seizures, loss of consciousness or new visual complains, weakness, numbness, dizziness.  8:51 PM Patient reassessed.  She received medications, that helped her slightly with a  headache, but the headache has not resolved. She still feels pretty weak. Family and patient are comfortable going home this way.  ?  UTI at this time clinically.  UA does have some leukocytes, but no bacteria.  Will get urine cultures.  If cultures positive, we should consider treating her.  Stool studies also sent.  ?  Cerebellar infarct on the CT scan.  Will defer MRI to admitting team.  Final Clinical Impression(s) / ED Diagnoses Final diagnoses:  Weakness  Dehydration    Rx / DC Orders ED Discharge Orders     None         Derwood Kaplan, MD 05/24/23 2053

## 2023-05-24 NOTE — Assessment & Plan Note (Addendum)
 This is the pricnipal reason for presentation to ER today. Not a.w. other neurolgic signs. But single episode of vomtiing. CT head - Possible age indeterminate subcentimeter left cerebellar infarct. I will get a stat MRI brain. Will order stokre orderset. Chck sed rate (although not a temporal headceh and curently no tenderness on temple)  Husband also complaining of somnolence in patient. Will titrate down xanax for patient.

## 2023-05-24 NOTE — ED Triage Notes (Signed)
 Pt recently diagnosed with flu/PNA and was released yesterday. Pt reports worsening symptoms since returning home. Pt spouse reports pt has been a "little incoherent" pt reports a severe headache unresponsive to medication. Pt reports tingling bilaterally in calf's and feet.

## 2023-05-24 NOTE — Assessment & Plan Note (Signed)
 Started today. C diff testing pending.

## 2023-05-24 NOTE — H&P (Signed)
 History and Physical    Patient: Grace Hess UJW:[id number] DOB: 07/20/1955 DOA: 05/24/2023 DOS: the patient was seen and examined on 05/24/2023 PCP: Elfredia Nevins, MD  Patient coming from: Home  Chief Complaint: headache. Chief Complaint  Patient presents with   Influenza   HPI: Grace Hess is a 68 y.o. female with medical history significant of renal cell carcinoma, coronary artery disease with stent placed in 2018, TIAs, GERD, depression, hypertension, COPD, type 2 diabetes. [facility] with acute resp failure with hypoxia due to influenxa A pneumonia and DC on 05/22/2023  Husband is the principal complaining for this encounter.  He states that the wife has been sleeping 20 hours a day since leaving the hospital.  Eating very little.  But has been ambulating to the bathroom and back as needed.  Patient does not feel like she is eating very little.  Although she does corroborate that that she has been tired and has been resting more than lately.  Patient had an abrupt onset of severe headache earlier this morning.  Which is the principal reason that the patient actually presented to the ER because the patient insisted that the husband called 911.  There is no associated vision changes focal weakness.  Patient actually threw up once here in the waiting area as per husband.  There is no report of fever abdominal pain.  Although there is report of patient having had 4 loose bowel movement since then over the course of the day today.  Patient is found to be vitally stable in the ER headache has currently resolved spontaneously.  And patient was generally felt to be extremely weak when attempted ambulation by the ER attending.  Therefore medical evaluation is sought.  Vitals have remained stable.   Review of Systems: As mentioned in the history of present illness. All other systems reviewed and are negative. Past Medical History:  Diagnosis Date   Anxiety    CAD (coronary  artery disease)    a. s/p DES to RCA in 07/2016 with residual mid-LAD stenosis   Cancer (HCC)    skin cancer on finger,, cancer left kidney   Cervicalgia    Chest pain, unspecified    Chronic kidney disease    Depression    GERD (gastroesophageal reflux disease)    Headache    "bad ones after my stroke in 2016; only have them when I get bad news now" (08/02/2016)   Heart murmur    Heavy smoker (more than 20 cigarettes per day)    Scheduled for LDCT screening 02/11/15   History of hiatal hernia    History of kidney stones    "no cysto/OR" (12/10/2014)   Hypercholesteremia    Hypertension    Obesity    Palpitations    Reflux esophagitis    Stroke [facility])    "they said I had a  MINI stroke a few months back/MRI" (12/10/2014)   TIA (transient ischemic attack) 11/28/2014   [name] 11/28/2014   Type II diabetes mellitus (HCC)    Past Surgical History:  Procedure Laterality Date   ABDOMINAL AORTOGRAM N/A 08/02/2016   Procedure: Abdominal Aortogram;  Surgeon: Yvonne Kendall, MD;  Location: [facility];  Service: Cardiovascular;  Laterality: N/A;   CAROTID STENT     CHOLECYSTECTOMY OPEN  1978   COLONOSCOPY  2010   single diverticulum in sigmoid colon   CORONARY ANGIOPLASTY WITH STENT PLACEMENT  08/02/2016   to RCA   CORONARY STENT  INTERVENTION N/A 08/02/2016   Procedure: Coronary Stent Intervention;  Surgeon: Yvonne Kendall, MD;  Location: [facility];  Service: Cardiovascular;  Laterality: N/A;  RCA   CYSTOSCOPY W/ RETROGRADES Bilateral 07/15/2021   Procedure: CYSTOSCOPY WITH RETROGRADE PYELOGRAM;  Surgeon: Milderd Meager., MD;  Location: [facility];  Service: Urology;  Laterality: Bilateral;   CYSTOSCOPY WITH STENT PLACEMENT Left 07/15/2021   Procedure: CYSTOSCOPY WITH STENT PLACEMENT;  Surgeon: Milderd Meager., MD;  Location: [facility];  Service: Urology;  Laterality: Left;   ENDARTERECTOMY Left 12/02/2014   Procedure: ENDARTERECTOMY CAROTID;  Surgeon: Larina Earthly, MD;  Location: [facility] OR;  Service: Vascular;  Laterality: Left;   HERNIA REPAIR     LAPAROSCOPIC NEPHRECTOMY Left    LAPAROSCOPIC NEPHRECTOMY Left 11/25/2021   Procedure: LEFT LAPAROSCOPIC RADICAL NEPHRECTOMY;  Surgeon: Crist Fat, MD;  Location: [facility];  Service: Urology;  Laterality: Left;   LEFT HEART CATH AND CORONARY ANGIOGRAPHY N/A 08/02/2016   Procedure: Left Heart Cath and Coronary Angiography;  Surgeon: Yvonne Kendall, MD;  Location: [facility];  Service: Cardiovascular;  Laterality: N/A;   LOWER EXTREMITY ANGIOGRAM  08/02/2016   LOWER EXTREMITY ANGIOGRAPHY N/A 08/02/2016   Procedure: Lower Extremity Angiography;  Surgeon: Yvonne Kendall, MD;  Location: [facility];  Service: Cardiovascular;  Laterality: N/A;   NEPHRECTOMY Left    VAGINAL HYSTERECTOMY  1991   "partial"   VENTRAL HERNIA REPAIR N/A 08/05/2022   Procedure: LAPAROSCOPIC VENTRAL WALL HERNIA REPAIR WITH MESH;  Surgeon: Karie Soda, MD;  Location: [facility];  Service: General;  Laterality: N/A;   Social History:  reports that she has been smoking cigarettes. She started smoking about 49 years ago. She has a 73.5 pack-year smoking history. She has never used smokeless tobacco. She reports that she does not drink alcohol and does not use drugs.  Allergies  Allergen Reactions   Codeine Shortness Of Breath   Sulfa Antibiotics Other (See Comments)    Unknown    Latex Itching    Family History  Problem Relation Age of Onset   Congestive Heart Failure Mother    COPD Father    Cancer Brother    Cancer Brother    Breast cancer Neg Hx     Prior to Admission medications   Medication Sig Start Date End Date Taking? Authorizing Provider  acetaminophen (TYLENOL) 325 MG tablet Take 2 tablets (650 mg total) by mouth every 4 (four) hours as needed for headache or mild pain. 08/03/16   Abelino Derrick, PA-C  acidophilus (RISAQUAD) CAPS capsule Take 2 capsules by mouth 3 (three) times daily for 5  days. 05/22/23 05/27/23  Shahmehdi, Gemma Payor, MD  albuterol (VENTOLIN HFA) 108 (90 Base) MCG/ACT inhaler Inhale 1-2 puffs into the lungs every 4 (four) hours as needed for shortness of breath or wheezing.    [provider]  ALPRAZolam Prudy Feeler) 0.5 MG tablet Take 0.25 mg by mouth 2 (two) times daily. 03/20/19   [provider]  Budeson-Glycopyrrol-Formoterol (BREZTRI AEROSPHERE) 160-9-4.8 MCG/ACT AERO Inhale into the lungs. 1-2 times per day    [provider]  calcitRIOL (ROCALTROL) 0.25 MCG capsule Take 0.25 mcg by mouth every Monday, Wednesday, and Friday. 06/14/22 06/14/23  [provider]  calcium acetate (PHOSLO) 667 MG tablet Take 667 mg by mouth daily. 06/14/22 06/14/23  [provider]  Cholecalciferol (VITAMIN D3) 125 MCG (5000 UT) CAPS Take 5,000 Units by mouth once a week. Tuesday  [provider]  clopidogrel (PLAVIX) 75 MG tablet Take 1 tablet (75 mg total) by mouth daily. 12/03/14   Rai, Ripudeep K, MD  escitalopram (LEXAPRO) 20 MG tablet Take 20 mg by mouth daily. 01/01/21   [provider]  gabapentin (NEURONTIN) 300 MG capsule Take 300 mg by mouth at bedtime.    [provider]  glucose blood (ACCU-CHEK GUIDE) test strip Use as instructed 08/23/19   Roma Kayser, MD  guaiFENesin-dextromethorphan (ROBITUSSIN DM) 100-10 MG/5ML syrup Take 10 mLs by mouth every 8 (eight) hours. 05/22/23   Shahmehdi, Gemma Payor, MD  insulin aspart (NOVOLOG FLEXPEN) 100 UNIT/ML FlexPen Inject 5 Units into the skin 3 (three) times daily with meals. 05/12/23   Dani Gobble, NP  insulin degludec (TRESIBA FLEXTOUCH) 100 UNIT/ML FlexTouch Pen Inject 10 Units into the skin at bedtime. 05/10/23   Dani Gobble, NP  Insulin Pen Needle (PEN NEEDLES) 31G X 6 MM MISC Use to inject insulin once daily 05/10/23   Dani Gobble, NP  levofloxacin (LEVAQUIN) 750 MG tablet Take 1 tablet (750 mg total) by mouth daily for 3 days. 05/22/23 05/25/23   Shahmehdi, Gemma Payor, MD  LOKELMA 10 g PACK packet Take 10 g by mouth 3 (three) times a week. 07/14/22   [provider]  methylPREDNISolone (MEDROL DOSEPAK) 4 MG TBPK tablet Medrol Dosepak take as instructed 05/22/23   Shahmehdi, Guadalupe Maple A, MD  nystatin cream (MYCOSTATIN) Apply 1 application  topically 2 (two) times daily as needed (Itching). 07/10/16   [provider]  ondansetron (ZOFRAN) 4 MG tablet TAKE 1 TABLET(4 MG) BY MOUTH TWICE DAILY AS NEEDED FOR NAUSEA OR VOMITING Patient taking differently: Take 4 mg by mouth every 8 (eight) hours as needed for nausea. 10/29/21   Dolores Frame, MD  oseltamivir (TAMIFLU) 30 MG capsule Take 1 capsule (30 mg total) by mouth daily for 3 days. 05/23/23 05/26/23  Kendell Bane, MD  oxyCODONE-acetaminophen (PERCOCET/ROXICET) 5-325 MG tablet Take 1 tablet by mouth every 6 (six) hours as needed for severe pain or moderate pain. 08/05/22   Karie Soda, MD  pantoprazole (PROTONIX) 40 MG tablet Take 1 tablet (40 mg total) by mouth daily. 11/02/22   Carlan, Jeral Pinch, NP  promethazine (PHENERGAN) 25 MG tablet Take 25 mg by mouth 4 (four) times daily as needed. 12/17/21   [provider]  rosuvastatin (CRESTOR) 20 MG tablet Take 20 mg by mouth at bedtime. 05/20/23   [provider]  sodium bicarbonate 650 MG tablet Take 650 mg by mouth 2 (two) times daily. 06/14/22   [provider]    Physical Exam: Vitals:   05/24/23 1421 05/24/23 1450 05/24/23 1729 05/24/23 2114  BP: (!) 188/87  (!) 152/68 (!) 163/67  Pulse: 76  65 60  Resp: 20  16 15   Temp:  97.6 F (36.4 C) 98.6 F (37 C) 98.3 F (36.8 C)  TempSrc:  Oral Oral Oral  SpO2: 97%  96% 94%  Weight:      Height:       General -patient is restful, soft-spoken, defers to husband to do most of the talking.  However she is able to give a coherent account of her symptoms and recall the events of the day today reasonably well.  Does not appear to be distressed.  As a  said she currently has no headache. Respiratory exam: Bilateral intravesicular Cardiovascular exam S1-S2 normal Abdomen all quadrant soft nontender Extremities warm without edema no cerebellar  signs no focal weakness.  Good 5/5 strength bilaterally. symMetric facies. Data Reviewed:  Labs on Admission:  Results for orders placed or performed during the hospital encounter of 05/24/23 (from the past 24 hours)  Resp panel by RT-PCR (RSV, Flu A&B, Covid) Anterior Nasal Swab     Status: None   Collection Time: 05/24/23  8:33 AM   Specimen: Anterior Nasal Swab  Result Value Ref Range   SARS Coronavirus 2 by RT PCR NEGATIVE NEGATIVE   Influenza A by PCR NEGATIVE NEGATIVE   Influenza B by PCR NEGATIVE NEGATIVE   Resp Syncytial Virus by PCR NEGATIVE NEGATIVE  CBC     Status: Abnormal   Collection Time: 05/24/23  9:36 AM  Result Value Ref Range   WBC 12.7 (H) 4.0 - 10.5 K/uL   RBC 4.20 3.87 - 5.11 MIL/uL   Hemoglobin 12.3 12.0 - 15.0 g/dL   HCT 81.1 91.4 - 78.2 %   MCV 91.2 80.0 - 100.0 fL   MCH 29.3 26.0 - 34.0 pg   MCHC 32.1 30.0 - 36.0 g/dL   RDW 95.6 21.3 - 08.6 %   Platelets 385 150 - 400 K/uL   nRBC 0.0 0.0 - 0.2 %  Comprehensive metabolic panel     Status: Abnormal   Collection Time: 05/24/23  9:36 AM  Result Value Ref Range   Sodium 136 135 - 145 mmol/L   Potassium 4.6 3.5 - 5.1 mmol/L   Chloride 103 98 - 111 mmol/L   CO2 21 (L) 22 - 32 mmol/L   Glucose, Bld 167 (H) 70 - 99 mg/dL   BUN 52 (H) 8 - 23 mg/dL   Creatinine, Ser 5.78 (H) 0.44 - 1.00 mg/dL   Calcium 9.0 8.9 - 46.9 mg/dL   Total Protein 6.5 6.5 - 8.1 g/dL   Albumin 2.9 (L) 3.5 - 5.0 g/dL   AST 15 15 - 41 U/L   ALT 17 0 - 44 U/L   Alkaline Phosphatase 65 38 - 126 U/L   Total Bilirubin 1.1 0.0 - 1.2 mg/dL   GFR, Estimated 21 (L) >60 mL/min   Anion gap 12 5 - 15  Urinalysis, Routine w reflex microscopic -Urine, Clean Catch     Status: Abnormal   Collection Time: 05/24/23  6:56 PM  Result Value Ref Range    Color, Urine YELLOW YELLOW   APPearance HAZY (A) CLEAR   Specific Gravity, Urine 1.016 1.005 - 1.030   pH 6.0 5.0 - 8.0   Glucose, UA 150 (A) NEGATIVE mg/dL   Hgb urine dipstick SMALL (A) NEGATIVE   Bilirubin Urine NEGATIVE NEGATIVE   Ketones, ur 5 (A) NEGATIVE mg/dL   Protein, ur 629 (A) NEGATIVE mg/dL   Nitrite NEGATIVE NEGATIVE   Leukocytes,Ua MODERATE (A) NEGATIVE   RBC / HPF 0-5 0 - 5 RBC/hpf   WBC, UA 0-5 0 - 5 WBC/hpf   Bacteria, UA RARE (A) NONE SEEN   Squamous Epithelial / HPF 0-5 0 - 5 /HPF   Mucus PRESENT    Basic Metabolic Panel: Recent Labs  Lab 05/20/23 1405 05/21/23 0442 05/22/23 0432 05/24/23 0936  NA 135 134* 135 136  K 3.8 4.8 4.7 4.6  CL 100 108 103 103  CO2 24 21* 21* 21*  GLUCOSE 121* 194* 276* 167*  BUN 66* 62* 58* 52*  CREATININE 2.91* 2.46* 2.54* 2.48*  CALCIUM 9.1 8.2* 8.7* 9.0   Liver Function Tests: Recent Labs  Lab 05/20/23 1405 05/24/23 0936  AST 22  15  ALT 16 17  ALKPHOS 83 65  BILITOT 0.4 1.1  PROT 6.6 6.5  ALBUMIN 2.8* 2.9*   No results for input(s): "LIPASE", "AMYLASE" in the last 168 hours. No results for input(s): "AMMONIA" in the last 168 hours. CBC: Recent Labs  Lab 05/20/23 1405 05/21/23 0442 05/22/23 0432 05/24/23 0936  WBC 14.3* 10.7* 11.1* 12.7*  HGB 11.3* 10.0* 9.8* 12.3  HCT 35.6* 31.6* 30.5* 38.3  MCV 92.7 91.3 91.3 91.2  PLT 378 335 325 385   Cardiac Enzymes: Recent Labs  Lab 05/20/23 1405 05/20/23 1700  TROPONINIHS 26* 23*    BNP (last 3 results) No results for input(s): "PROBNP" in the last 8760 hours. CBG: Recent Labs  Lab 05/21/23 1614 05/21/23 2118 05/22/23 0627 05/22/23 0715 05/22/23 1119  GLUCAP 265* 232* 259* 278* 218*    Radiological Exams on Admission:  CT Head Wo Contrast Result Date: 05/24/2023 CLINICAL DATA:  Headache, new onset (Age >= 51y). EXAM: CT HEAD WITHOUT CONTRAST TECHNIQUE: Contiguous axial images were obtained from the base of the skull through the vertex without  intravenous contrast. RADIATION DOSE REDUCTION: This exam was performed according to the departmental dose-optimization program which includes automated exposure control, adjustment of the mA and/or kV according to patient size and/or use of iterative reconstruction technique. COMPARISON:  CT head 12/10/2020 FINDINGS: Brain: There is no evidence of an acute large territory infarct, intracranial hemorrhage, extra-axial fluid collection, mass, or midline shift. A subcentimeter hypodensity posteriorly in the left cerebellar hemisphere was not evident on the prior CT and could reflect an age indeterminate infarct or artifact. Mild cerebral atrophy has progressed. Cerebral white matter hypodensities have also likely mildly progressed and are nonspecific but compatible with mild chronic small vessel ischemic disease. Vascular: Calcified atherosclerosis at the skull base. Skull: No acute fracture or suspicious lesion. Sinuses/Orbits: Mild mucosal thickening in the included portions of the paranasal sinuses. Small left mastoid effusion. Unremarkable orbits. Other: None. IMPRESSION: 1. No evidence of acute intracranial hemorrhage or large territory infarct. 2. 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 3. Mild chronic small vessel ischemic disease. Electronically Signed   By: Sebastian Ache M.D.   On: 05/24/2023 18:34   DG Chest Portable 1 View Result Date: 05/24/2023 CLINICAL DATA:  Cough. EXAM: PORTABLE CHEST 1 VIEW COMPARISON:  Chest radiograph dated 05/20/2023. FINDINGS: There is mild chronic antral coarsening. No focal consolidation, pleural effusion, or pneumothorax. Stable cardiac silhouette. Atherosclerotic calcification of the aortic arch. No acute osseous pathology. IMPRESSION: No active disease. Electronically Signed   By: Elgie Collard M.D.   On: 05/24/2023 12:11     No intake/output data recorded. No intake/output data recorded.      Assessment and Plan: * Headache This is  the pricnipal reason for presentation to ER today. Not a.w. other neurolgic signs. But single episode of vomtiing. CT head - Possible age indeterminate subcentimeter left cerebellar infarct. I will get a stat MRI brain. Will order stokre orderset. Chck sed rate (although not a temporal headceh and curently no tenderness on temple)  Husband also complaining of somnolence in patient. Will titrate down xanax for patient.  Diarrhea Started today. C diff testing pending.   Med rec pending pharmcy input.   Advance Care Planning:   Code Status: Prior full code.  Consults: none at this time.  Family Communication: husband at bedside. Discussed in detail.  Severity of Illness: The appropriate patient status for this patient is OBSERVATION. Observation status is judged to be reasonable  and necessary in order to provide the required intensity of service to ensure the patient's safety. The patient's presenting symptoms, physical exam findings, and initial radiographic and laboratory data in the context of their medical condition is felt to place them at decreased risk for further clinical deterioration. Furthermore, it is anticipated that the patient will be medically stable for discharge from the hospital within 2 midnights of admission.   Author: Nolberto Hanlon, MD 05/24/2023 9:20 PM  For on call review [email or web address].

## 2023-05-24 NOTE — ED Notes (Signed)
 cup of water given to patient.

## 2023-05-24 NOTE — ED Notes (Signed)
 Patient transported to CT

## 2023-05-25 ENCOUNTER — Observation Stay (HOSPITAL_BASED_OUTPATIENT_CLINIC_OR_DEPARTMENT_OTHER)

## 2023-05-25 ENCOUNTER — Other Ambulatory Visit: Payer: Self-pay

## 2023-05-25 ENCOUNTER — Encounter (HOSPITAL_COMMUNITY): Payer: Self-pay | Admitting: Internal Medicine

## 2023-05-25 ENCOUNTER — Observation Stay (HOSPITAL_COMMUNITY)

## 2023-05-25 DIAGNOSIS — I10 Essential (primary) hypertension: Secondary | ICD-10-CM

## 2023-05-25 DIAGNOSIS — Z9861 Coronary angioplasty status: Secondary | ICD-10-CM | POA: Diagnosis not present

## 2023-05-25 DIAGNOSIS — E1122 Type 2 diabetes mellitus with diabetic chronic kidney disease: Secondary | ICD-10-CM | POA: Diagnosis not present

## 2023-05-25 DIAGNOSIS — R197 Diarrhea, unspecified: Secondary | ICD-10-CM | POA: Diagnosis not present

## 2023-05-25 DIAGNOSIS — I6523 Occlusion and stenosis of bilateral carotid arteries: Secondary | ICD-10-CM

## 2023-05-25 DIAGNOSIS — I2510 Atherosclerotic heart disease of native coronary artery without angina pectoris: Secondary | ICD-10-CM | POA: Diagnosis not present

## 2023-05-25 DIAGNOSIS — I129 Hypertensive chronic kidney disease with stage 1 through stage 4 chronic kidney disease, or unspecified chronic kidney disease: Secondary | ICD-10-CM

## 2023-05-25 DIAGNOSIS — I6782 Cerebral ischemia: Secondary | ICD-10-CM | POA: Diagnosis not present

## 2023-05-25 DIAGNOSIS — R519 Headache, unspecified: Secondary | ICD-10-CM | POA: Diagnosis not present

## 2023-05-25 DIAGNOSIS — N1832 Chronic kidney disease, stage 3b: Secondary | ICD-10-CM

## 2023-05-25 DIAGNOSIS — Z8673 Personal history of transient ischemic attack (TIA), and cerebral infarction without residual deficits: Secondary | ICD-10-CM | POA: Diagnosis not present

## 2023-05-25 LAB — LIPID PANEL
Cholesterol: 168 mg/dL (ref 0–200)
HDL: 38 mg/dL — ABNORMAL LOW (ref >40)
LDL Cholesterol: 76 mg/dL (ref 0–99)
Total CHOL/HDL Ratio: 4.4 ratio
Triglycerides: 270 mg/dL — ABNORMAL HIGH (ref <150)
VLDL: 54 mg/dL — ABNORMAL HIGH (ref 0–40)

## 2023-05-25 LAB — CULTURE, BLOOD (ROUTINE X 2)
Culture: NO GROWTH
Culture: NO GROWTH

## 2023-05-25 LAB — ECHOCARDIOGRAM COMPLETE
AR max vel: 1.52 cm2
AV Area VTI: 1.74 cm2
AV Area mean vel: 1.62 cm2
AV Mean grad: 8 mmHg
AV Peak grad: 16.3 mmHg
Ao pk vel: 2.02 m/s
Area-P 1/2: 3.7 cm2
Height: 65 in
MV VTI: 2.26 cm2
S' Lateral: 2.3 cm
Weight: 2144.63 [oz_av]

## 2023-05-25 LAB — GLUCOSE, CAPILLARY
Glucose-Capillary: 180 mg/dL — ABNORMAL HIGH (ref 70–99)
Glucose-Capillary: 174 mg/dL — ABNORMAL HIGH (ref 70–99)

## 2023-05-25 MED ORDER — PANTOPRAZOLE SODIUM 40 MG PO TBEC
40.0000 mg | DELAYED_RELEASE_TABLET | Freq: Every day | ORAL | Status: DC
  Administered 2023-05-25: 40 mg via ORAL
  Filled 2023-05-25: qty 1

## 2023-05-25 MED ORDER — ONDANSETRON HCL 4 MG/2ML IJ SOLN
4.0000 mg | Freq: Four times a day (QID) | INTRAMUSCULAR | Status: DC | PRN
  Administered 2023-05-25: 4 mg via INTRAVENOUS
  Filled 2023-05-25: qty 2

## 2023-05-25 MED ORDER — ONDANSETRON HCL 4 MG PO TABS: 4.0000 mg | ORAL_TABLET | Freq: Four times a day (QID) | ORAL | 0 refills | Status: AC | PRN

## 2023-05-25 NOTE — Progress Notes (Signed)
 PROGRESS NOTE    Grace Hess  ZOX:[id number] DOB: 05-10-1955 DOA: 05/24/2023 PCP: Elfredia Nevins, MD  Subjective: Pt seen and examined. Husband at bedside. He states pt looks much better. Pt is agrees she is feelin better. Only 1 episode of diarrhea last night.  Due to pharmacy not having meds available, pt was not taking any of her discharge meds the last 2 days.  No steroids, tamiflu or levaquin.  Advised pt and husband that when pt is not feeling well, she should not take her regular scheduled pain med and xanax. This could cause worsening of symptoms.  Possible home this afternoon if pt able to eat solid food.   Hospital Course: HPI: Grace Hess is a 68 y.o. female with medical history significant of renal cell carcinoma, coronary artery disease with stent placed in 2018, TIAs, GERD, depression, hypertension, COPD, type 2 diabetes. [facility] with acute resp failure with hypoxia due to influenxa A pneumonia and DC on 05/22/2023   Husband is the principal complaining for this encounter.  He states that the wife has been sleeping 20 hours a day since leaving the hospital.  Eating very little.  But has been ambulating to the bathroom and back as needed.  Patient does not feel like she is eating very little.  Although she does corroborate that that she has been tired and has been resting more than lately.  Patient had an abrupt onset of severe headache earlier this morning.  Which is the principal reason that the patient actually presented to the ER because the patient insisted that the husband called 911.  There is no associated vision changes focal weakness.  Patient actually threw up once here in the waiting area as per husband.  There is no report of fever abdominal pain.  Although there is report of patient having had 4 loose bowel movement since then over the course of the day today.   Patient is found to be vitally stable in the ER headache has currently resolved  spontaneously.  And patient was generally felt to be extremely weak when attempted ambulation by the ER attending.  Therefore medical evaluation is sought.  Vitals have remained stable.    Significant Events: Admitted 05/24/2023 for headache   Significant Labs: Covid/flu/rsv negative WBC 12.7, HgB 12.3, plt 385 Na 136, K 4.6, CO2 of 21, BUN 52, Scr 2.48, gluc 167  Significant Imaging Studies:   Antibiotic Therapy: Anti-infectives (From admission, onward)    None       Procedures:   Consultants:     Assessment and Plan: * Headache On admission. This is the pricnipal reason for presentation to ER today. Not a.w. other neurolgic signs. But single episode of vomtiing. CT head - Possible age indeterminate subcentimeter left cerebellar infarct. I will get a stat MRI brain. Will order stokre orderset. Chck sed rate (although not a temporal headceh and curently no tenderness on temple) Husband also complaining of somnolence in patient. Will titrate down xanax for patient.  05-25-2023 prn tylenol. Resolving.  Diarrhea On admission. Started today. C diff testing pending.  05-25-2023 only 1 episode of diarrhea yesterday. None today. Doubt C diff.  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) 05-25-2023 on SSI. Diabetic diet.  CAD S/P percutaneous coronary angioplasty 05-25-2023 stable. No chest pain.  Essential hypertension, benign 05-25-2023 stable.    DVT prophylaxis: heparin injection 5,000 Units Start: 05/24/23 2200 SCD's Start: 05/24/23 2144  Code Status: Full Code Family Communication: discussed with pt and husband at bedside Disposition Plan: return home Reason for continuing need for hospitalization: possible DC today.  Objective: Vitals:   05/24/23 2114 05/24/23 2245 05/25/23 0426 05/25/23 0958  BP: (!) 163/67 (!) 189/69 (!) 178/74   Pulse: 60 (!) 58 71   Resp: 15 16    Temp: 98.3 F (36.8 C) 98.2 F (36.8 C)  98.9 F (37.2 C)   TempSrc: Oral Oral Oral   SpO2: 94% 96% 98% 98%  Weight:  60.8 kg    Height:  5\' 5"  (1.651 m)     No intake or output data in the 24 hours ending 05/25/23 1214 Filed Weights   05/24/23 0826 05/24/23 2245  Weight: 62.1 kg 60.8 kg    Examination:  Physical Exam Vitals and nursing note reviewed.  Constitutional:      General: She is not in acute distress.    Appearance: She is not toxic-appearing or diaphoretic.  HENT:     Head: Normocephalic and atraumatic.     Nose: Nose normal.  Eyes:     General: No scleral icterus. Cardiovascular:     Rate and Rhythm: Normal rate and regular rhythm.  Pulmonary:     Effort: Pulmonary effort is normal.     Breath sounds: Normal breath sounds.  Abdominal:     General: Bowel sounds are normal. There is no distension.     Palpations: Abdomen is soft.     Tenderness: There is no abdominal tenderness.  Musculoskeletal:     Right lower leg: No edema.     Left lower leg: No edema.  Skin:    General: Skin is warm and dry.     Capillary Refill: Capillary refill takes less than 2 seconds.  Neurological:     General: No focal deficit present.     Mental Status: She is alert and oriented to person, place, and time.     Data Reviewed: I have personally reviewed following labs and imaging studies  CBC: Recent Labs  Lab 05/20/23 1405 05/21/23 0442 05/22/23 0432 05/24/23 0936  WBC 14.3* 10.7* 11.1* 12.7*  HGB 11.3* 10.0* 9.8* 12.3  HCT 35.6* 31.6* 30.5* 38.3  MCV 92.7 91.3 91.3 91.2  PLT 378 335 325 385   Basic Metabolic Panel: Recent Labs  Lab 05/20/23 1405 05/21/23 0442 05/22/23 0432 05/24/23 0936  NA 135 134* 135 136  K 3.8 4.8 4.7 4.6  CL 100 108 103 103  CO2 24 21* 21* 21*  GLUCOSE 121* 194* 276* 167*  BUN 66* 62* 58* 52*  CREATININE 2.91* 2.46* 2.54* 2.48*  CALCIUM 9.1 8.2* 8.7* 9.0   GFR: Estimated Creatinine Clearance: 19.5 mL/min (A) (by C-G formula based on SCr of 2.48 mg/dL (H)). Liver  Function Tests: Recent Labs  Lab 05/20/23 1405 05/24/23 0936  AST 22 15  ALT 16 17  ALKPHOS 83 65  BILITOT 0.4 1.1  PROT 6.6 6.5  ALBUMIN 2.8* 2.9*   No results for input(s): "LIPASE", "AMYLASE" in the last 168 hours. No results for input(s): "AMMONIA" in the last 168 hours. Coagulation Profile: Recent Labs  Lab 05/20/23 1700  INR 1.0   Cardiac Enzymes: No results for input(s): "CKTOTAL", "CKMB", "CKMBINDEX", "TROPONINI" in the last 168 hours. BNP (last 3 results) Recent Labs    05/20/23 1405  BNP 1,003.0*   HbA1C: No results for input(s): "HGBA1C" in the last 72 hours. CBG: Recent Labs  Lab 05/22/23 1119 05/24/23 2240 05/24/23  2311 05/25/23 0721 05/25/23 1107  GLUCAP 218* 113* 113* 180* 174*   Lipid Profile: Recent Labs    05/25/23 0413  CHOL 168  HDL 38*  LDLCALC 76  TRIG 161*  CHOLHDL 4.4   Thyroid Function Tests: No results for input(s): "TSH", "T4TOTAL", "FREET4", "T3FREE", "THYROIDAB" in the last 72 hours. Anemia Panel: No results for input(s): "VITAMINB12", "FOLATE", "FERRITIN", "TIBC", "IRON", "RETICCTPCT" in the last 72 hours. Sepsis Labs: Recent Labs  Lab 05/20/23 1700 05/20/23 1850 05/22/23 0432  PROCALCITON  --  0.31 0.15  LATICACIDVEN 0.7 0.6  --     Recent Results (from the past 240 hours)  Resp panel by RT-PCR (RSV, Flu A&B, Covid) Anterior Nasal Swab     Status: Abnormal   Collection Time: 05/20/23  1:19 PM   Specimen: Anterior Nasal Swab  Result Value Ref Range Status   SARS Coronavirus 2 by RT PCR NEGATIVE NEGATIVE Final    Comment: (NOTE) SARS-CoV-2 target nucleic acids are NOT DETECTED.  The SARS-CoV-2 RNA is generally detectable in upper respiratory specimens during the acute phase of infection. The lowest concentration of SARS-CoV-2 viral copies this assay can detect is 138 copies/mL. A negative result does not preclude SARS-Cov-2 infection and should not be used as the sole basis for treatment or other patient  management decisions. A negative result may occur with  improper specimen collection/handling, submission of specimen other than nasopharyngeal swab, presence of viral mutation(s) within the areas targeted by this assay, and inadequate number of viral copies(<138 copies/mL). A negative result must be combined with clinical observations, patient history, and epidemiological information. The expected result is Negative.  Fact Sheet for Patients:  BloggerCourse.com  Fact Sheet for Healthcare Providers:  SeriousBroker.it  This test is no t yet approved or cleared by the [place] FDA and  has been authorized for detection and/or diagnosis of SARS-CoV-2 by FDA under an Emergency Use Authorization (EUA). This EUA will remain  in effect (meaning this test can be used) for the duration of the COVID-19 declaration under Section 564(b)(1) of the Act, 21 U.S.C.section 360bbb-3(b)(1), unless the authorization is terminated  or revoked sooner.       Influenza A by PCR POSITIVE (A) NEGATIVE Final   Influenza B by PCR NEGATIVE NEGATIVE Final    Comment: (NOTE) The Xpert Xpress SARS-CoV-2/FLU/RSV plus assay is intended as an aid in the diagnosis of influenza from Nasopharyngeal swab specimens and should not be used as a sole basis for treatment. Nasal washings and aspirates are unacceptable for Xpert Xpress SARS-CoV-2/FLU/RSV testing.  Fact Sheet for Patients: BloggerCourse.com  Fact Sheet for Healthcare Providers: SeriousBroker.it  This test is not yet approved or cleared by the [place] FDA and has been authorized for detection and/or diagnosis of SARS-CoV-2 by FDA under an Emergency Use Authorization (EUA). This EUA will remain in effect (meaning this test can be used) for the duration of the COVID-19 declaration under Section 564(b)(1) of the Act, 21 U.S.C. section  360bbb-3(b)(1), unless the authorization is terminated or revoked.     Resp Syncytial Virus by PCR NEGATIVE NEGATIVE Final    Comment: (NOTE) Fact Sheet for Patients: BloggerCourse.com  Fact Sheet for Healthcare Providers: SeriousBroker.it  This test is not yet approved or cleared by the [place] FDA and has been authorized for detection and/or diagnosis of SARS-CoV-2 by FDA under an Emergency Use Authorization (EUA). This EUA will remain in effect (meaning this test can be used) for the duration of the COVID-19 declaration  under Section 564(b)(1) of the Act, 21 U.S.C. section 360bbb-3(b)(1), unless the authorization is terminated or revoked.  Performed at [facility], [address], [place], [place] [address]   Blood Culture (routine x 2)     Status: None   Collection Time: 05/20/23  5:00 PM   Specimen: BLOOD  Result Value Ref Range Status   Specimen Description BLOOD BLOOD RIGHT ARM  Final   Special Requests NONE  Final   Culture   Final    NO GROWTH 5 DAYS Performed at [facility], [address], [place], [place] [address]    Report Status 05/25/2023 FINAL  Final  Blood Culture (routine x 2)     Status: None   Collection Time: 05/20/23  5:40 PM   Specimen: BLOOD  Result Value Ref Range Status   Specimen Description BLOOD BLOOD LEFT ARM  Final   Special Requests NONE  Final   Culture   Final    NO GROWTH 5 DAYS Performed at [facility], [address], [place], [place] [address]    Report Status 05/25/2023 FINAL  Final  Resp panel by RT-PCR (RSV, Flu A&B, Covid) Anterior Nasal Swab     Status: None   Collection Time: 05/24/23  8:33 AM   Specimen: Anterior Nasal Swab  Result Value Ref Range Status   SARS Coronavirus 2 by RT PCR NEGATIVE NEGATIVE Final    Comment: (NOTE) SARS-CoV-2 target nucleic acids are NOT DETECTED.  The SARS-CoV-2 RNA is generally detectable in upper respiratory specimens during the  acute phase of infection. The lowest concentration of SARS-CoV-2 viral copies this assay can detect is 138 copies/mL. A negative result does not preclude SARS-Cov-2 infection and should not be used as the sole basis for treatment or other patient management decisions. A negative result may occur with  improper specimen collection/handling, submission of specimen other than nasopharyngeal swab, presence of viral mutation(s) within the areas targeted by this assay, and inadequate number of viral copies(<138 copies/mL). A negative result must be combined with clinical observations, patient history, and epidemiological information. The expected result is Negative.  Fact Sheet for Patients:  BloggerCourse.com  Fact Sheet for Healthcare Providers:  SeriousBroker.it  This test is no t yet approved or cleared by the [place] FDA and  has been authorized for detection and/or diagnosis of SARS-CoV-2 by FDA under an Emergency Use Authorization (EUA). This EUA will remain  in effect (meaning this test can be used) for the duration of the COVID-19 declaration under Section 564(b)(1) of the Act, 21 U.S.C.section 360bbb-3(b)(1), unless the authorization is terminated  or revoked sooner.       Influenza A by PCR NEGATIVE NEGATIVE Final   Influenza B by PCR NEGATIVE NEGATIVE Final    Comment: (NOTE) The Xpert Xpress SARS-CoV-2/FLU/RSV plus assay is intended as an aid in the diagnosis of influenza from Nasopharyngeal swab specimens and should not be used as a sole basis for treatment. Nasal washings and aspirates are unacceptable for Xpert Xpress SARS-CoV-2/FLU/RSV testing.  Fact Sheet for Patients: BloggerCourse.com  Fact Sheet for Healthcare Providers: SeriousBroker.it  This test is not yet approved or cleared by the [place] FDA and has been authorized for detection and/or  diagnosis of SARS-CoV-2 by FDA under an Emergency Use Authorization (EUA). This EUA will remain in effect (meaning this test can be used) for the duration of the COVID-19 declaration under Section 564(b)(1) of the Act, 21 U.S.C. section 360bbb-3(b)(1), unless the authorization is terminated or revoked.  Resp Syncytial Virus by PCR NEGATIVE NEGATIVE Final    Comment: (NOTE) Fact Sheet for Patients: BloggerCourse.com  Fact Sheet for Healthcare Providers: SeriousBroker.it  This test is not yet approved or cleared by the [place] FDA and has been authorized for detection and/or diagnosis of SARS-CoV-2 by FDA under an Emergency Use Authorization (EUA). This EUA will remain in effect (meaning this test can be used) for the duration of the COVID-19 declaration under Section 564(b)(1) of the Act, 21 U.S.C. section 360bbb-3(b)(1), unless the authorization is terminated or revoked.  Performed at [facility], [address], [place], [place] [address]      Radiology Studies: MR BRAIN WO CONTRAST Result Date: 05/25/2023 CLINICAL DATA:  Provided history: Neuro deficit, acute, stroke suspected. EXAM: MRI HEAD WITHOUT CONTRAST TECHNIQUE: Multiplanar, multiecho pulse sequences of the brain and surrounding structures were obtained without intravenous contrast. COMPARISON:  Head CT 05/24/2023.  Brain MRI 12/23/2014. FINDINGS: Mild intermittent motion degradation. Within this limitation, findings are as follows. Brain: No age-advanced or lobar predominant cerebral atrophy. Multifocal T2 FLAIR hyperintense signal abnormality within the cerebral white matter, nonspecific but compatible with mild chronic small vessel ischemic disease. Prominent perivascular space within the left basal ganglia inferiorly. Linear chronic microhemorrhage within the left cerebellar hemisphere, new from the prior brain MRI of 12/23/2014. There is no acute infarct. No  evidence of an intracranial mass. No extra-axial fluid collection. No midline shift. Vascular: Maintained flow voids within the proximal large arterial vessels. Skull and upper cervical spine: No focal worrisome marrow lesion. Sinuses/Orbits: No mass or acute finding within the imaged orbits. Moderate mucosal thickening within the bilateral maxillary sinuses. Other: Bilateral mastoid effusions (left larger than right). IMPRESSION: 1. Mildly motion degraded exam. 2. No evidence of an acute intracranial abnormality. 3. Mild chronic small vessel ischemic changes within the cerebral white matter, slightly progressed from the prior brain MRI of 12/23/2014. 4. Chronic microhemorrhage within the left cerebellar hemisphere, new from the prior MRI. 5. Moderate mucosal thickening within the bilateral maxillary sinuses. 6. Bilateral mastoid effusions. Electronically Signed   By: Jackey Loge D.O.   On: 05/25/2023 09:28   CT Head Wo Contrast Result Date: 05/24/2023 CLINICAL DATA:  Headache, new onset (Age >= 51y). EXAM: CT HEAD WITHOUT CONTRAST TECHNIQUE: Contiguous axial images were obtained from the base of the skull through the vertex without intravenous contrast. RADIATION DOSE REDUCTION: This exam was performed according to the departmental dose-optimization program which includes automated exposure control, adjustment of the mA and/or kV according to patient size and/or use of iterative reconstruction technique. COMPARISON:  CT head 12/10/2020 FINDINGS: Brain: There is no evidence of an acute large territory infarct, intracranial hemorrhage, extra-axial fluid collection, mass, or midline shift. A subcentimeter hypodensity posteriorly in the left cerebellar hemisphere was not evident on the prior CT and could reflect an age indeterminate infarct or artifact. Mild cerebral atrophy has progressed. Cerebral white matter hypodensities have also likely mildly progressed and are nonspecific but compatible with mild chronic  small vessel ischemic disease. Vascular: Calcified atherosclerosis at the skull base. Skull: No acute fracture or suspicious lesion. Sinuses/Orbits: Mild mucosal thickening in the included portions of the paranasal sinuses. Small left mastoid effusion. Unremarkable orbits. Other: None. IMPRESSION: 1. No evidence of acute intracranial hemorrhage or large territory infarct. 2. 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 3. Mild chronic small vessel ischemic disease. Electronically Signed   By: Sebastian Ache M.D.   On: 05/24/2023 18:34   DG Chest Portable 1 View  Result Date: 05/24/2023 CLINICAL DATA:  Cough. EXAM: PORTABLE CHEST 1 VIEW COMPARISON:  Chest radiograph dated 05/20/2023. FINDINGS: There is mild chronic antral coarsening. No focal consolidation, pleural effusion, or pneumothorax. Stable cardiac silhouette. Atherosclerotic calcification of the aortic arch. No acute osseous pathology. IMPRESSION: No active disease. Electronically Signed   By: Elgie Collard M.D.   On: 05/24/2023 12:11    Scheduled Meds:  calcium acetate  667 mg Oral TID WC   clopidogrel  75 mg Oral Daily   fluticasone furoate-vilanterol  1 puff Inhalation Daily   heparin  5,000 Units Subcutaneous Q8H   insulin aspart  0-5 Units Subcutaneous QHS   insulin aspart  0-9 Units Subcutaneous TID WC   pantoprazole  40 mg Oral Daily   sodium zirconium cyclosilicate  10 g Oral Q48H   Continuous Infusions:   LOS: 0 days   Time spent: 45 minutes  Carollee Herter, DO  [facility]  05/25/2023, 12:14 PM

## 2023-05-25 NOTE — Evaluation (Signed)
 Occupational Therapy Evaluation Patient Details Name: Grace Hess MRN: [id number] DOB: 07-17-1955 Today's Date: 05/25/2023   History of Present Illness   Pt reportedly has been sleeping 20 hours a day since leaving the hospital.  Eating very little.  But has been ambulating to the bathroom and back as needed.  Patient does not feel like she is eating very little.  Although she does corroborate that that she has been tired and has been resting more than lately.  Patient had an abrupt onset of severe headache earlier this morning.  Which is the principal reason that the patient actually presented to the ER because the patient insisted that the husband called 911.  There is no associated vision changes focal weakness.  Patient actually threw up once here in the waiting area as per husband.  There is no report of fever abdominal pain.  Although there is report of patient having had 4 loose bowel movement since then over the course of the day. MRI results pending     Clinical Impressions Pt agreeable to OT/PT co-evaluation, just returned from MRI upon rehab arrival. Pt demonstrating BUE strength and coordination WNL, performing ADLs with supervision this am. Pt reports good family support, husband and family available to assist with ADLs as needed at home. No further OT needs at this time.      If plan is discharge home, recommend the following:   A little help with bathing/dressing/bathroom;Assistance with cooking/housework     Functional Status Assessment   Patient has had a recent decline in their functional status and demonstrates the ability to make significant improvements in function in a reasonable and predictable amount of time.     Equipment Recommendations   None recommended by OT      Precautions/Restrictions   Precautions Precautions: Fall Precaution/Restrictions Comments: Pt with 1 fall at home recently, fell in waiting room Restrictions Weight Bearing  Restrictions Per Provider Order: No     Mobility Bed Mobility Overal bed mobility: Independent                  Transfers Overall transfer level: Needs assistance Equipment used: None Transfers: Sit to/from Stand Sit to Stand: Supervision                      ADL either performed or assessed with clinical judgement   ADL Overall ADL's : Needs assistance/impaired     Grooming: Wash/dry hands;Supervision/safety;Standing Grooming Details (indicate cue type and reason): Pt standing at sink for hand hyigene, no LOB                 Toilet Transfer: Supervision/safety;Ambulation Toilet Transfer Details (indicate cue type and reason): simulated with functional mobility and transfer to chair Toileting- Clothing Manipulation and Hygiene: Modified independent;Sitting/lateral lean;Sit to/from stand       Functional mobility during ADLs: Supervision/safety       Vision Baseline Vision/History: 1 Wears glasses Ability to See in Adequate Light: 1 Impaired Patient Visual Report: No change from baseline Vision Assessment?: No apparent visual deficits            Pertinent Vitals/Pain Pain Assessment Pain Assessment: 0-10 Pain Score: 7  Pain Location: head Pain Descriptors / Indicators: Headache Pain Intervention(s): Limited activity within patient's tolerance, Monitored during session, Premedicated before session     Extremity/Trunk Assessment Upper Extremity Assessment Upper Extremity Assessment: Overall WFL for tasks assessed   Lower Extremity Assessment Lower Extremity Assessment: Defer to PT evaluation  Cervical / Trunk Assessment Cervical / Trunk Assessment: Normal   Communication Communication Communication: No apparent difficulties   Cognition Arousal: Alert Behavior During Therapy: WFL for tasks assessed/performed Cognition: No apparent impairments             OT - Cognition Comments: Pt alert and oriented x4                  Following commands: Intact                  Home Living Family/patient expects to be discharged to:: Private residence Living Arrangements: Spouse/significant other Available Help at Discharge: Family;Available 24 hours/day Type of Home: House Home Access: Level entry     Home Layout: One level     Bathroom Shower/Tub: Chief Strategy Officer: Handicapped height     Home Equipment: Agricultural consultant (2 wheels);Shower seat;Grab bars - toilet;Other (comment) (raised toilet seat)          Prior Functioning/Environment Prior Level of Function : Independent/Modified Independent;History of Falls (last six months)             Mobility Comments: Pt with access to a RW if needed, independent in mobility per report ADLs Comments: Pt independent in ADLs, husband and family available to assist if needed. Recently husband standing by for showering if assist needed    OT Problem List: Impaired balance (sitting and/or standing)             Co-evaluation PT/OT/SLP Co-Evaluation/Treatment: Yes Reason for Co-Treatment: Complexity of the patient's impairments (multi-system involvement)   OT goals addressed during session: ADL's and self-care      AM-PAC OT "6 Clicks" Daily Activity     Outcome Measure Help from another person eating meals?: None Help from another person taking care of personal grooming?: None Help from another person toileting, which includes using toliet, bedpan, or urinal?: None Help from another person bathing (including washing, rinsing, drying)?: None Help from another person to put on and taking off regular upper body clothing?: None Help from another person to put on and taking off regular lower body clothing?: None 6 Click Score: 24   End of Session Equipment Utilized During Treatment: Gait belt Nurse Communication: Mobility status  Activity Tolerance: Patient tolerated treatment well Patient left: in chair;with call bell/phone within  reach;with chair alarm set;with family/visitor present  OT Visit Diagnosis: Muscle weakness (generalized) (M62.81);Repeated falls (R29.6)                Time: 5784-6962 OT Time Calculation (min): 28 min Charges:  OT General Charges $OT Visit: 1 Visit  Ezra Sites, OTR/L  [phone] 05/25/2023, 9:35 AM

## 2023-05-25 NOTE — Hospital Course (Signed)
 HPI: Grace Hess is a 68 y.o. female with medical history significant of renal cell carcinoma, coronary artery disease with stent placed in 2018, TIAs, GERD, depression, hypertension, COPD, type 2 diabetes. Admited [facility] 05/[facility] with acute resp failure with hypoxia due to influenxa A pneumonia and DC on 05/22/2023   Husband is the principal complaining for this encounter.  He states that the wife has been sleeping 20 hours a day since leaving the hospital.  Eating very little.  But has been ambulating to the bathroom and back as needed.  Patient does not feel like she is eating very little.  Although she does corroborate that that she has been tired and has been resting more than lately.  Patient had an abrupt onset of severe headache earlier this morning.  Which is the principal reason that the patient actually presented to the ER because the patient insisted that the husband called 911.  There is no associated vision changes focal weakness.  Patient actually threw up once here in the waiting area as per husband.  There is no report of fever abdominal pain.  Although there is report of patient having had 4 loose bowel movement since then over the course of the day today.   Patient is found to be vitally stable in the ER headache has currently resolved spontaneously.  And patient was generally felt to be extremely weak when attempted ambulation by the ER attending.  Therefore medical evaluation is sought.  Vitals have remained stable.    Significant Events: Admitted 05/24/2023 for headache   Significant Labs: Covid/flu/rsv negative WBC 12.7, HgB 12.3, plt 385 Na 136, K 4.6, CO2 of 21, BUN 52, Scr 2.48, gluc 167  Significant Imaging Studies:   Antibiotic Therapy: Anti-infectives (From admission, onward)    None       Procedures:   Consultants:

## 2023-05-25 NOTE — Discharge Summary (Signed)
 [facility] Physician Discharge Summary   Patient name: Grace Hess  Admit date:     05/24/2023  Discharge date: 05/25/2023  Attending Physician: Nolberto Hanlon [[id number]]  Discharge Physician: Carollee Herter   PCP: Elfredia Nevins, MD  Admitted From: Home  Disposition:  Home  Recommendations for Outpatient Follow-up:  Follow up with PCP in 1-2 weeks  Home Health:No Equipment/Devices: None  Discharge Condition:Stable CODE STATUS:FULL Diet recommendation: Diabetic Fluid Restriction: None  Hospital Summary: HPI: Grace Hess is a 68 y.o. female with medical history significant of renal cell carcinoma, coronary artery disease with stent placed in 2018, TIAs, GERD, depression, hypertension, COPD, type 2 diabetes. [facility] with acute resp failure with hypoxia due to influenxa A pneumonia and DC on 05/22/2023   Husband is the principal complaining for this encounter.  He states that the wife has been sleeping 20 hours a day since leaving the hospital.  Eating very little.  But has been ambulating to the bathroom and back as needed.  Patient does not feel like she is eating very little.  Although she does corroborate that that she has been tired and has been resting more than lately.  Patient had an abrupt onset of severe headache earlier this morning.  Which is the principal reason that the patient actually presented to the ER because the patient insisted that the husband called 911.  There is no associated vision changes focal weakness.  Patient actually threw up once here in the waiting area as per husband.  There is no report of fever abdominal pain.  Although there is report of patient having had 4 loose bowel movement since then over the course of the day today.   Patient is found to be vitally stable in the ER headache has currently resolved spontaneously.  And patient was generally felt to be extremely weak when attempted ambulation by the ER attending.   Therefore medical evaluation is sought.  Vitals have remained stable.    Significant Events: Admitted 05/24/2023 for headache   Significant Labs: Covid/flu/rsv negative WBC 12.7, HgB 12.3, plt 385 Na 136, K 4.6, CO2 of 21, BUN 52, Scr 2.48, gluc 167  Significant Imaging Studies:   Antibiotic Therapy: Anti-infectives (From admission, onward)    None       Procedures:   Consultants:    Hospital Course by Problem: * Headache On admission. This is the pricnipal reason for presentation to ER today. Not a.w. other neurolgic signs. But single episode of vomtiing. CT head - Possible age indeterminate subcentimeter left cerebellar infarct. I will get a stat MRI brain. Will order stokre orderset. Chck sed rate (although not a temporal headceh and curently no tenderness on temple) Husband also complaining of somnolence in patient. Will titrate down xanax for patient.  05-25-2023 prn tylenol. Resolving.  Diarrhea On admission. Started today. C diff testing pending.  05-25-2023 only 1 episode of diarrhea yesterday. None today. Doubt C diff.  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) 05-25-2023 on SSI. Diabetic diet.  CAD S/P percutaneous coronary angioplasty 05-25-2023 stable. No chest pain.  Essential hypertension, benign 05-25-2023 stable.    Discharge Diagnoses:  Principal Problem:   Headache Active Problems:   Diarrhea   Essential hypertension, benign   CAD S/P percutaneous coronary angioplasty   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 [facility])   Discharge Instructions  Discharge Instructions     Call MD  for:  difficulty breathing, headache or visual disturbances   Complete by: As directed    Call MD for:  extreme fatigue   Complete by: As directed    Call MD for:  hives   Complete by: As directed    Call MD for:  persistant dizziness or light-headedness   Complete by: As  directed    Call MD for:  persistant nausea and vomiting   Complete by: As directed    Call MD for:  redness, tenderness, or signs of infection (pain, swelling, redness, odor or green/yellow discharge around incision site)   Complete by: As directed    Call MD for:  severe uncontrolled pain   Complete by: As directed    Call MD for:  temperature >100.4   Complete by: As directed    Diet - low sodium heart healthy   Complete by: As directed    Diet Carb Modified   Complete by: As directed    Discharge instructions   Complete by: As directed    1. Follow up with your primary care provider in 1-2 weeks following discharge from hospital.   Increase activity slowly   Complete by: As directed       Allergies as of 05/25/2023       Reactions   Codeine Shortness Of Breath   Sulfa Antibiotics Other (See Comments)   Unknown    Latex Itching        Medication List     STOP taking these medications    promethazine 25 MG tablet Commonly known as: PHENERGAN   rosuvastatin 20 MG tablet Commonly known as: CRESTOR       TAKE these medications    Accu-Chek Guide test strip Generic drug: glucose blood Use as instructed   acetaminophen 325 MG tablet Commonly known as: TYLENOL Take 2 tablets (650 mg total) by mouth every 4 (four) hours as needed for headache or mild pain.   acidophilus Caps capsule Take 2 capsules by mouth 3 (three) times daily for 5 days.   albuterol 108 (90 Base) MCG/ACT inhaler Commonly known as: VENTOLIN HFA Inhale 1-2 puffs into the lungs every 4 (four) hours as needed for shortness of breath or wheezing.   ALPRAZolam 0.5 MG tablet Commonly known as: XANAX Take 0.25 mg by mouth 2 (two) times daily.   atorvastatin 40 MG tablet Commonly known as: LIPITOR Take 40 mg by mouth daily.   Breztri Aerosphere 160-9-4.8 MCG/ACT Aero Generic drug: budeson-glycopyrrolate-formoterol Inhale into the lungs. 1-2 times per day   calcitRIOL 0.25 MCG  capsule Commonly known as: ROCALTROL Take 0.25 mcg by mouth every Monday, Wednesday, and Friday.   calcium acetate 667 MG tablet Commonly known as: PHOSLO Take 667 mg by mouth daily.   clopidogrel 75 MG tablet Commonly known as: PLAVIX Take 1 tablet (75 mg total) by mouth daily.   escitalopram 20 MG tablet Commonly known as: LEXAPRO Take 20 mg by mouth daily.   gabapentin 300 MG capsule Commonly known as: NEURONTIN Take 300 mg by mouth every other day. At night   guaiFENesin-dextromethorphan 100-10 MG/5ML syrup Commonly known as: ROBITUSSIN DM Take 10 mLs by mouth every 8 (eight) hours.   Lokelma 10 g Pack packet Generic drug: sodium zirconium cyclosilicate Take 10 g by mouth See admin instructions. Five times a week   NovoLOG FlexPen 100 UNIT/ML FlexPen Generic drug: insulin aspart Inject 5 Units into the skin 3 (three) times daily with meals.   nystatin cream Commonly known  as: MYCOSTATIN Apply 1 application  topically 2 (two) times daily as needed (Itching).   ondansetron 4 MG tablet Commonly known as: ZOFRAN Take 1 tablet (4 mg total) by mouth every 6 (six) hours as needed for nausea or vomiting. What changed:  how much to take how to take this when to take this reasons to take this additional instructions   oxyCODONE-acetaminophen 5-325 MG tablet Commonly known as: PERCOCET/ROXICET Take 1 tablet by mouth every 6 (six) hours as needed for severe pain or moderate pain.   pantoprazole 40 MG tablet Commonly known as: PROTONIX Take 1 tablet (40 mg total) by mouth daily.   Pen Needles 31G X 6 MM Misc Use to inject insulin once daily   sodium bicarbonate 650 MG tablet Take 650 mg by mouth 2 (two) times daily.   Evaristo Bury FlexTouch 100 UNIT/ML FlexTouch Pen Generic drug: insulin degludec Inject 10 Units into the skin at bedtime.   Vitamin D3 125 MCG (5000 UT) Caps Take 5,000 Units by mouth once a week. Tuesday        Allergies  Allergen Reactions    Codeine Shortness Of Breath   Sulfa Antibiotics Other (See Comments)    Unknown    Latex Itching    Discharge Exam: Vitals:   05/25/23 0958 05/25/23 1354  BP:  (!) 135/51  Pulse:  64  Resp:    Temp:  98.7 F (37.1 C)  SpO2: 98% 96%    Physical Exam Vitals and nursing note reviewed.  Constitutional:      General: She is not in acute distress.    Appearance: She is not toxic-appearing or diaphoretic.  HENT:     Head: Normocephalic and atraumatic.     Nose: Nose normal.  Eyes:     General: No scleral icterus. Cardiovascular:     Rate and Rhythm: Normal rate and regular rhythm.  Pulmonary:     Effort: Pulmonary effort is normal.     Breath sounds: Normal breath sounds.  Abdominal:     General: Bowel sounds are normal. There is no distension.     Palpations: Abdomen is soft.     Tenderness: There is no abdominal tenderness.  Musculoskeletal:     Right lower leg: No edema.     Left lower leg: No edema.  Skin:    General: Skin is warm and dry.     Capillary Refill: Capillary refill takes less than 2 seconds.  Neurological:     General: No focal deficit present.     Mental Status: She is alert and oriented to person, place, and time.     The results of significant diagnostics from this hospitalization (including imaging, microbiology, ancillary and laboratory) are listed below for reference.    Microbiology: Recent Results (from the past 240 hours)  Resp panel by RT-PCR (RSV, Flu A&B, Covid) Anterior Nasal Swab     Status: Abnormal   Collection Time: 05/20/23  1:19 PM   Specimen: Anterior Nasal Swab  Result Value Ref Range Status   SARS Coronavirus 2 by RT PCR NEGATIVE NEGATIVE Final    Comment: (NOTE) SARS-CoV-2 target nucleic acids are NOT DETECTED.  The SARS-CoV-2 RNA is generally detectable in upper respiratory specimens during the acute phase of infection. The lowest concentration of SARS-CoV-2 viral copies this assay can detect is 138 copies/mL. A negative  result does not preclude SARS-Cov-2 infection and should not be used as the sole basis for treatment or other patient management decisions. A negative result  may occur with  improper specimen collection/handling, submission of specimen other than nasopharyngeal swab, presence of viral mutation(s) within the areas targeted by this assay, and inadequate number of viral copies(<138 copies/mL). A negative result must be combined with clinical observations, patient history, and epidemiological information. The expected result is Negative.  Fact Sheet for Patients:  BloggerCourse.com  Fact Sheet for Healthcare Providers:  SeriousBroker.it  This test is no t yet approved or cleared by the [place] FDA and  has been authorized for detection and/or diagnosis of SARS-CoV-2 by FDA under an Emergency Use Authorization (EUA). This EUA will remain  in effect (meaning this test can be used) for the duration of the COVID-19 declaration under Section 564(b)(1) of the Act, 21 U.S.C.section 360bbb-3(b)(1), unless the authorization is terminated  or revoked sooner.       Influenza A by PCR POSITIVE (A) NEGATIVE Final   Influenza B by PCR NEGATIVE NEGATIVE Final    Comment: (NOTE) The Xpert Xpress SARS-CoV-2/FLU/RSV plus assay is intended as an aid in the diagnosis of influenza from Nasopharyngeal swab specimens and should not be used as a sole basis for treatment. Nasal washings and aspirates are unacceptable for Xpert Xpress SARS-CoV-2/FLU/RSV testing.  Fact Sheet for Patients: BloggerCourse.com  Fact Sheet for Healthcare Providers: SeriousBroker.it  This test is not yet approved or cleared by the [place] FDA and has been authorized for detection and/or diagnosis of SARS-CoV-2 by FDA under an Emergency Use Authorization (EUA). This EUA will remain in effect (meaning this test can be  used) for the duration of the COVID-19 declaration under Section 564(b)(1) of the Act, 21 U.S.C. section 360bbb-3(b)(1), unless the authorization is terminated or revoked.     Resp Syncytial Virus by PCR NEGATIVE NEGATIVE Final    Comment: (NOTE) Fact Sheet for Patients: BloggerCourse.com  Fact Sheet for Healthcare Providers: SeriousBroker.it  This test is not yet approved or cleared by the [place] FDA and has been authorized for detection and/or diagnosis of SARS-CoV-2 by FDA under an Emergency Use Authorization (EUA). This EUA will remain in effect (meaning this test can be used) for the duration of the COVID-19 declaration under Section 564(b)(1) of the Act, 21 U.S.C. section 360bbb-3(b)(1), unless the authorization is terminated or revoked.  Performed at [facility], [address], [place], [place] [address]   Blood Culture (routine x 2)     Status: None   Collection Time: 05/20/23  5:00 PM   Specimen: BLOOD  Result Value Ref Range Status   Specimen Description BLOOD BLOOD RIGHT ARM  Final   Special Requests NONE  Final   Culture   Final    NO GROWTH 5 DAYS Performed at [facility], [address], [place], [place] [address]    Report Status 05/25/2023 FINAL  Final  Blood Culture (routine x 2)     Status: None   Collection Time: 05/20/23  5:40 PM   Specimen: BLOOD  Result Value Ref Range Status   Specimen Description BLOOD BLOOD LEFT ARM  Final   Special Requests NONE  Final   Culture   Final    NO GROWTH 5 DAYS Performed at [facility], [address], [place], [place] [address]    Report Status 05/25/2023 FINAL  Final  Resp panel by RT-PCR (RSV, Flu A&B, Covid) Anterior Nasal Swab     Status: None   Collection Time: 05/24/23  8:33 AM   Specimen: Anterior Nasal Swab  Result Value Ref Range Status  SARS Coronavirus 2 by RT PCR NEGATIVE NEGATIVE Final    Comment: (NOTE) SARS-CoV-2 target nucleic acids are  NOT DETECTED.  The SARS-CoV-2 RNA is generally detectable in upper respiratory specimens during the acute phase of infection. The lowest concentration of SARS-CoV-2 viral copies this assay can detect is 138 copies/mL. A negative result does not preclude SARS-Cov-2 infection and should not be used as the sole basis for treatment or other patient management decisions. A negative result may occur with  improper specimen collection/handling, submission of specimen other than nasopharyngeal swab, presence of viral mutation(s) within the areas targeted by this assay, and inadequate number of viral copies(<138 copies/mL). A negative result must be combined with clinical observations, patient history, and epidemiological information. The expected result is Negative.  Fact Sheet for Patients:  BloggerCourse.com  Fact Sheet for Healthcare Providers:  SeriousBroker.it  This test is no t yet approved or cleared by the [place] FDA and  has been authorized for detection and/or diagnosis of SARS-CoV-2 by FDA under an Emergency Use Authorization (EUA). This EUA will remain  in effect (meaning this test can be used) for the duration of the COVID-19 declaration under Section 564(b)(1) of the Act, 21 U.S.C.section 360bbb-3(b)(1), unless the authorization is terminated  or revoked sooner.       Influenza A by PCR NEGATIVE NEGATIVE Final   Influenza B by PCR NEGATIVE NEGATIVE Final    Comment: (NOTE) The Xpert Xpress SARS-CoV-2/FLU/RSV plus assay is intended as an aid in the diagnosis of influenza from Nasopharyngeal swab specimens and should not be used as a sole basis for treatment. Nasal washings and aspirates are unacceptable for Xpert Xpress SARS-CoV-2/FLU/RSV testing.  Fact Sheet for Patients: BloggerCourse.com  Fact Sheet for Healthcare Providers: SeriousBroker.it  This test is not  yet approved or cleared by the [place] FDA and has been authorized for detection and/or diagnosis of SARS-CoV-2 by FDA under an Emergency Use Authorization (EUA). This EUA will remain in effect (meaning this test can be used) for the duration of the COVID-19 declaration under Section 564(b)(1) of the Act, 21 U.S.C. section 360bbb-3(b)(1), unless the authorization is terminated or revoked.     Resp Syncytial Virus by PCR NEGATIVE NEGATIVE Final    Comment: (NOTE) Fact Sheet for Patients: BloggerCourse.com  Fact Sheet for Healthcare Providers: SeriousBroker.it  This test is not yet approved or cleared by the [place] FDA and has been authorized for detection and/or diagnosis of SARS-CoV-2 by FDA under an Emergency Use Authorization (EUA). This EUA will remain in effect (meaning this test can be used) for the duration of the COVID-19 declaration under Section 564(b)(1) of the Act, 21 U.S.C. section 360bbb-3(b)(1), unless the authorization is terminated or revoked.  Performed at [facility], [address], [place], [place] [address]     Labs:  Basic Metabolic Panel: Recent Labs  Lab 05/20/23 1405 05/21/23 0442 05/22/23 0432 05/24/23 0936  NA 135 134* 135 136  K 3.8 4.8 4.7 4.6  CL 100 108 103 103  CO2 24 21* 21* 21*  GLUCOSE 121* 194* 276* 167*  BUN 66* 62* 58* 52*  CREATININE 2.91* 2.46* 2.54* 2.48*  CALCIUM 9.1 8.2* 8.7* 9.0   Liver Function Tests: Recent Labs  Lab 05/20/23 1405 05/24/23 0936  AST 22 15  ALT 16 17  ALKPHOS 83 65  BILITOT 0.4 1.1  PROT 6.6 6.5  ALBUMIN 2.8* 2.9*   CBC: Recent Labs  Lab 05/20/23 1405 05/21/23 0442 05/22/23 0432 05/24/23 6045  WBC 14.3* 10.7* 11.1* 12.7*  HGB 11.3* 10.0* 9.8* 12.3  HCT 35.6* 31.6* 30.5* 38.3  MCV 92.7 91.3 91.3 91.2  PLT 378 335 325 385   CBG: Recent Labs  Lab 05/22/23 1119 05/24/23 2240 05/24/23 2311 05/25/23 0721 05/25/23 1107   GLUCAP 218* 113* 113* 180* 174*   Lipid Profile Recent Labs    05/25/23 0413  CHOL 168  HDL 38*  LDLCALC 76  TRIG 130*  CHOLHDL 4.4   Urinalysis    Component Value Date/Time   COLORURINE YELLOW 05/24/2023 1856   APPEARANCEUR HAZY (A) 05/24/2023 1856   LABSPEC 1.016 05/24/2023 1856   PHURINE 6.0 05/24/2023 1856   GLUCOSEU 150 (A) 05/24/2023 1856   HGBUR SMALL (A) 05/24/2023 1856   BILIRUBINUR NEGATIVE 05/24/2023 1856   KETONESUR 5 (A) 05/24/2023 1856   PROTEINUR 100 (A) 05/24/2023 1856   NITRITE NEGATIVE 05/24/2023 1856   LEUKOCYTESUR MODERATE (A) 05/24/2023 1856   Sepsis Labs Recent Labs  Lab 05/20/23 1405 05/20/23 1850 05/21/23 0442 05/22/23 0432 05/24/23 0936  PROCALCITON  --  0.31  --  0.15  --   WBC 14.3*  --  10.7* 11.1* 12.7*   Procedures/Studies: ECHOCARDIOGRAM COMPLETE Result Date: 05/25/2023    ECHOCARDIOGRAM REPORT   Patient Name:   Grace Hess Date of Exam: 05/25/2023 Medical Rec #:  [id number]        Height:       65.0 in Accession #:    [id number]       Weight:       134.0 lb Date of Birth:  04/12/1955         BSA:          1.669 m Patient Age:    68 years         BP:           178/74 mmHg Patient Gender: F                HR:           63 bpm. [facility] Procedure: 2D Echo, Color Doppler, Cardiac Doppler and Saline Contrast Bubble            Study (Both Spectral and Color Flow Doppler were utilized during            procedure). Indications:    Stroke  History:        Patient has prior history of Echocardiogram examinations. CAD,                 TIA; Risk Factors:Current Smoker, Hypertension and Diabetes.  Sonographer:    Lamont Snowball Referring Phys: [id number] [facility] GOEL IMPRESSIONS  1. Left ventricular ejection fraction, by estimation, is 70 to 75%. The left ventricle has hyperdynamic function. The left ventricle has no regional wall motion abnormalities. There is mild left ventricular hypertrophy. Left ventricular diastolic parameters are  indeterminate. Elevated left atrial pressure.  2. Right ventricular systolic function is normal. The right ventricular size is normal.  3. Left atrial size was mildly dilated.  4. The mitral valve is normal in structure. Trivial mitral valve regurgitation. No evidence of mitral stenosis.  5. The aortic valve was not well visualized. There is mild calcification of the aortic valve. There is mild thickening of the aortic valve. Aortic valve regurgitation is mild. No aortic stenosis is present.  6. The inferior vena cava is normal in size with greater than 50% respiratory variability, suggesting right atrial pressure of  3 mmHg.  7. Agitated saline contrast bubble study was negative, with no evidence of any interatrial shunt. FINDINGS  Left Ventricle: Left ventricular ejection fraction, by estimation, is 70 to 75%. The left ventricle has hyperdynamic function. The left ventricle has no regional wall motion abnormalities. The left ventricular internal cavity size was normal in size. There is mild left ventricular hypertrophy. Left ventricular diastolic parameters are indeterminate. Elevated left atrial pressure. Right Ventricle: The right ventricular size is normal. Right vetricular wall thickness was not well visualized. Right ventricular systolic function is normal. Left Atrium: Left atrial size was mildly dilated. Right Atrium: Right atrial size was normal in size. Pericardium: There is no evidence of pericardial effusion. Mitral Valve: The mitral valve is normal in structure. There is mild thickening of the mitral valve leaflet(s). There is mild calcification of the mitral valve leaflet(s). Mild mitral annular calcification. Trivial mitral valve regurgitation. No evidence  of mitral valve stenosis. MV peak gradient, 5.2 mmHg. The mean mitral valve gradient is 2.0 mmHg. Tricuspid Valve: The tricuspid valve is normal in structure. Tricuspid valve regurgitation is not demonstrated. No evidence of tricuspid stenosis.  Aortic Valve: The aortic valve was not well visualized. There is mild calcification of the aortic valve. There is mild thickening of the aortic valve. There is mild aortic valve annular calcification. Aortic valve regurgitation is mild. No aortic stenosis is present. Aortic valve mean gradient measures 8.0 mmHg. Aortic valve peak gradient measures 16.3 mmHg. Aortic valve area, by VTI measures 1.74 cm. Pulmonic Valve: The pulmonic valve was not well visualized. Pulmonic valve regurgitation is not visualized. No evidence of pulmonic stenosis. Aorta: The aortic root and ascending aorta are structurally normal, with no evidence of dilitation. Venous: The inferior vena cava is normal in size with greater than 50% respiratory variability, suggesting right atrial pressure of 3 mmHg. IAS/Shunts: The interatrial septum was not well visualized. Agitated saline contrast was given intravenously to evaluate for intracardiac shunting. Agitated saline contrast bubble study was negative, with no evidence of any interatrial shunt.  LEFT VENTRICLE PLAX 2D LVIDd:         4.30 cm   Diastology LVIDs:         2.30 cm   LV e' medial:    6.20 cm/s LV PW:         1.10 cm   LV E/e' medial:  17.1 LV IVS:        1.10 cm   LV e' lateral:   5.77 cm/s LVOT diam:     1.90 cm   LV E/e' lateral: 18.4 LV SV:         79 LV SV Index:   47 LVOT Area:     2.84 cm  RIGHT VENTRICLE             IVC RV Basal diam:  3.00 cm     IVC diam: 1.20 cm RV S prime:     14.00 cm/s TAPSE (M-mode): 1.7 cm LEFT ATRIUM             Index        RIGHT ATRIUM           Index LA Vol (A2C):   72.3 ml 43.33 ml/m  RA Area:     11.30 cm LA Vol (A4C):   58.9 ml 35.30 ml/m  RA Volume:   20.10 ml  12.04 ml/m LA Biplane Vol: 65.2 ml 39.07 ml/m  AORTIC VALVE AV Area (Vmax):    1.52  cm AV Area (Vmean):   1.62 cm AV Area (VTI):     1.74 cm AV Vmax:           202.00 cm/s AV Vmean:          133.000 cm/s AV VTI:            0.451 m AV Peak Grad:      16.3 mmHg AV Mean Grad:       8.0 mmHg LVOT Vmax:         108.00 cm/s LVOT Vmean:        76.000 cm/s LVOT VTI:          0.277 m LVOT/AV VTI ratio: 0.61  AORTA Ao Root diam: 2.90 cm Ao Asc diam:  3.10 cm MITRAL VALVE MV Area (PHT): 3.70 cm     SHUNTS MV Area VTI:   2.26 cm     Systemic VTI:  0.28 m MV Peak grad:  5.2 mmHg     Systemic Diam: 1.90 cm MV Mean grad:  2.0 mmHg MV Vmax:       1.14 m/s MV Vmean:      64.6 cm/s MV Decel Time: 205 msec MV E velocity: 106.00 cm/s MV A velocity: 112.00 cm/s MV E/A ratio:  0.95 Dina Rich MD Electronically signed by Dina Rich MD Signature Date/Time: 05/25/2023/1:08:20 PM    Final    MR BRAIN WO CONTRAST Result Date: 05/25/2023 CLINICAL DATA:  Provided history: Neuro deficit, acute, stroke suspected. EXAM: MRI HEAD WITHOUT CONTRAST TECHNIQUE: Multiplanar, multiecho pulse sequences of the brain and surrounding structures were obtained without intravenous contrast. COMPARISON:  Head CT 05/24/2023.  Brain MRI 12/23/2014. FINDINGS: Mild intermittent motion degradation. Within this limitation, findings are as follows. Brain: No age-advanced or lobar predominant cerebral atrophy. Multifocal T2 FLAIR hyperintense signal abnormality within the cerebral white matter, nonspecific but compatible with mild chronic small vessel ischemic disease. Prominent perivascular space within the left basal ganglia inferiorly. Linear chronic microhemorrhage within the left cerebellar hemisphere, new from the prior brain MRI of 12/23/2014. There is no acute infarct. No evidence of an intracranial mass. No extra-axial fluid collection. No midline shift. Vascular: Maintained flow voids within the proximal large arterial vessels. Skull and upper cervical spine: No focal worrisome marrow lesion. Sinuses/Orbits: No mass or acute finding within the imaged orbits. Moderate mucosal thickening within the bilateral maxillary sinuses. Other: Bilateral mastoid effusions (left larger than right). IMPRESSION: 1. Mildly motion  degraded exam. 2. No evidence of an acute intracranial abnormality. 3. Mild chronic small vessel ischemic changes within the cerebral white matter, slightly progressed from the prior brain MRI of 12/23/2014. 4. Chronic microhemorrhage within the left cerebellar hemisphere, new from the prior MRI. 5. Moderate mucosal thickening within the bilateral maxillary sinuses. 6. Bilateral mastoid effusions. Electronically Signed   By: Jackey Loge D.O.   On: 05/25/2023 09:28   CT Head Wo Contrast Result Date: 05/24/2023 CLINICAL DATA:  Headache, new onset (Age >= 51y). EXAM: CT HEAD WITHOUT CONTRAST TECHNIQUE: Contiguous axial images were obtained from the base of the skull through the vertex without intravenous contrast. RADIATION DOSE REDUCTION: This exam was performed according to the departmental dose-optimization program which includes automated exposure control, adjustment of the mA and/or kV according to patient size and/or use of iterative reconstruction technique. COMPARISON:  CT head 12/10/2020 FINDINGS: Brain: There is no evidence of an acute large territory infarct, intracranial hemorrhage, extra-axial fluid collection, mass, or midline shift. A subcentimeter hypodensity posteriorly in the  left cerebellar hemisphere was not evident on the prior CT and could reflect an age indeterminate infarct or artifact. Mild cerebral atrophy has progressed. Cerebral white matter hypodensities have also likely mildly progressed and are nonspecific but compatible with mild chronic small vessel ischemic disease. Vascular: Calcified atherosclerosis at the skull base. Skull: No acute fracture or suspicious lesion. Sinuses/Orbits: Mild mucosal thickening in the included portions of the paranasal sinuses. Small left mastoid effusion. Unremarkable orbits. Other: None. IMPRESSION: 1. No evidence of acute intracranial hemorrhage or large territory infarct. 2. 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 3. Mild  chronic small vessel ischemic disease. Electronically Signed   By: Sebastian Ache M.D.   On: 05/24/2023 18:34   DG Chest Portable 1 View Result Date: 05/24/2023 CLINICAL DATA:  Cough. EXAM: PORTABLE CHEST 1 VIEW COMPARISON:  Chest radiograph dated 05/20/2023. FINDINGS: There is mild chronic antral coarsening. No focal consolidation, pleural effusion, or pneumothorax. Stable cardiac silhouette. Atherosclerotic calcification of the aortic arch. No acute osseous pathology. IMPRESSION: No active disease. Electronically Signed   By: Elgie Collard M.D.   On: 05/24/2023 12:11   DG Chest 2 View Result Date: 05/20/2023 CLINICAL DATA:  Cough and runny nose for 3-4 days. History of renal cell carcinoma EXAM: CHEST - 2 VIEW COMPARISON:  X-ray 11/28/2021.  CT 01/10/2023. FINDINGS: Stable mildly enlarged cardiac silhouette. No pneumothorax, edema. Mild interstitial prominence, unchanged from previous and likely chronic. Calcified aorta. Degenerative changes along the spine. Question tiny pleural effusions. IMPRESSION: Tiny pleural effusions.  Slight interstitial prominence. Electronically Signed   By: Karen Kays M.D.   On: 05/20/2023 17:14   DG Chest Portable 1 View Result Date: 05/20/2023 CLINICAL DATA:  Shortness of breath and coughing. History of renal cell carcinoma. EXAM: PORTABLE CHEST 1 VIEW COMPARISON:  X-ray 05/19/2023 and older.  CT 01/10/23 FINDINGS: Stable cardiopericardial silhouette. Calcified aorta. There is increasing interstitial changes in the lungs with tiny effusions and some subtle areas of lung nodularity. Surgical clips in the right upper quadrant of the abdomen. Osteopenia and degenerative changes. IMPRESSION: Increasing interstitial changes the lungs with some subtle nodular areas and trace pleural fluid. Acute process is possible. Recommend short follow-up. Electronically Signed   By: Karen Kays M.D.   On: 05/20/2023 17:12    Time coordinating discharge: 50 mins  SIGNED:  Carollee Herter,  DO [facility] 05/25/23, 2:48 PM

## 2023-05-25 NOTE — Evaluation (Addendum)
Physical Therapy Evaluation Patient Details Name: Grace Hess MRN: [id number] DOB: 1955-11-08 Today's Date: 05/25/2023  History of Present Illness  Pt reportedly has been sleeping 20 hours a day since leaving the hospital.  Eating very little.  But has been ambulating to the bathroom and back as needed.  Patient does not feel like she is eating very little.  Although she does corroborate that that she has been tired and has been resting more than lately.  Patient had an abrupt onset of severe headache earlier this morning.  Which is the principal reason that the patient actually presented to the ER because the patient insisted that the husband called 911.  There is no associated vision changes focal weakness.  Patient actually threw up once here in the waiting area as per husband.  There is no report of fever abdominal pain.  Although there is report of patient having had 4 loose bowel movement since then over the course of the day. MRI results pending  Clinical Impression  Pt presents in bed with EOB, and nurse giving nausea meds. Pt had just returned from MRI. Pt demonstrates WFL LE strength but reports hx of falling. Pt able to ambulate with no need for AD at a very slowed velocity. Pt was educated on the various options with follow-up PT and voices interest in outpatient PT. Pt also educated on the importance of walking more often during time remaining in the hospital and when she returns home. Pt would continue to benefit from skilled acute PT for increased gait speed and improved LE strength for return to [facility] and to meet community ambulation requirements.         If plan is discharge home, recommend the following: Help with stairs or ramp for entrance   Can travel by private vehicle        Equipment Recommendations Cane  Recommendations for Other Services       Functional Status Assessment Patient has had a recent decline in their functional status and demonstrates the ability to  make significant improvements in function in a reasonable and predictable amount of time.     Precautions / Restrictions Precautions Precautions: Fall Precaution/Restrictions Comments: Pt with 1 fall at home recently, fell in waiting room Restrictions Weight Bearing Restrictions Per Provider Order: No      Mobility  Bed Mobility Overal bed mobility: Independent                  Transfers Overall transfer level: Needs assistance Equipment used: None Transfers: Sit to/from Stand Sit to Stand: Supervision                Ambulation/Gait Ambulation/Gait assistance: Independent Gait Distance (Feet): 75 Feet Assistive device: None Gait Pattern/deviations: Decreased stride length Gait velocity: slow     General Gait Details: cautious, decreased step length  Stairs            Wheelchair Mobility     Tilt Bed    Modified Rankin (Stroke Patients Only)       Balance Overall balance assessment: Independent, History of Falls                                           Pertinent Vitals/Pain Pain Assessment Pain Assessment: 0-10 Pain Score: 7  Pain Location: head Pain Descriptors / Indicators: Headache Pain Intervention(s): Limited activity within patient's tolerance, Monitored during  session, Premedicated before session    Home Living Family/patient expects to be discharged to:: Private residence Living Arrangements: Spouse/significant other Available Help at Discharge: Family;Available 24 hours/day Type of Home: House Home Access: Level entry       Home Layout: One level Home Equipment: Agricultural consultant (2 wheels);Shower seat;Grab bars - toilet;Other (comment)      Prior Function Prior Level of Function : Independent/Modified Independent;History of Falls (last six months)             Mobility Comments: Pt with access to a RW if needed, independent in mobility per report ADLs Comments: Pt independent in ADLs, husband and  family available to assist if needed. Recently husband standing by for showering if assist needed     Extremity/Trunk Assessment   Upper Extremity Assessment Upper Extremity Assessment: Defer to OT evaluation    Lower Extremity Assessment Lower Extremity Assessment: Overall WFL for tasks assessed;Generalized weakness (general weakness 4-/5 globally)    Cervical / Trunk Assessment Cervical / Trunk Assessment: Normal  Communication   Communication Communication: No apparent difficulties    Cognition Arousal: Alert Behavior During Therapy: WFL for tasks assessed/performed                             Following commands: Intact       Cueing Cueing Techniques: Verbal cues, Tactile cues, Visual cues     General Comments      Exercises     Assessment/Plan    PT Assessment All further PT needs can be met in the next venue of care  PT Problem List Decreased strength;Decreased activity tolerance;Decreased balance;Decreased mobility;Pain       PT Treatment Interventions      PT Goals (Current goals can be found in the Care Plan section)  Acute Rehab PT Goals Patient Stated Goal: to return home PT Goal Formulation: With patient Time For Goal Achievement: 06/08/23 Potential to Achieve Goals: Good    Frequency       Co-evaluation PT/OT/SLP Co-Evaluation/Treatment: Yes Reason for Co-Treatment: Complexity of the patient's impairments (multi-system involvement) PT goals addressed during session: Mobility/safety with mobility;Balance;Strengthening/ROM OT goals addressed during session: ADL's and self-care       AM-PAC PT "6 Clicks" Mobility  Outcome Measure Help needed turning from your back to your side while in a flat bed without using bedrails?: None Help needed moving from lying on your back to sitting on the side of a flat bed without using bedrails?: None Help needed moving to and from a bed to a chair (including a wheelchair)?: A Little Help needed  standing up from a chair using your arms (e.g., wheelchair or bedside chair)?: None Help needed to walk in hospital room?: A Little Help needed climbing 3-5 steps with a railing? : A Lot 6 Click Score: 20    End of Session Equipment Utilized During Treatment: Gait belt Activity Tolerance: Patient tolerated treatment well;No increased pain Patient left: in chair;with call bell/phone within reach;with chair alarm set;with family/visitor present Nurse Communication: Mobility status PT Visit Diagnosis: Unsteadiness on feet (R26.81);Muscle weakness (generalized) (M62.81);History of falling (Z91.81)    Time: 4098-1191 PT Time Calculation (min) (ACUTE ONLY): 26 min   Charges:   PT Evaluation $PT Eval Low Complexity: 1 Low PT Treatments $Therapeutic Activity: 8-22 mins PT General Charges $$ ACUTE PT VISIT: 1 Visit         Luz Lex, PT, DPT [facility] Office: [phone])  [phone]  

## 2023-05-25 NOTE — Subjective & Objective (Addendum)
 Pt seen and examined. Husband at bedside. He states pt looks much better. Pt is agrees she is feelin better. Only 1 episode of diarrhea last night.  Due to pharmacy not having meds available, pt was not taking any of her discharge meds the last 2 days.  No steroids, tamiflu or levaquin.  Advised pt and husband that when pt is not feeling well, she should not take her regular scheduled pain med and xanax. This could cause worsening of symptoms.  Possible home this afternoon if pt able to eat solid food.

## 2023-05-25 NOTE — Plan of Care (Signed)
  Problem: Acute Rehab PT Goals(only PT should resolve) Goal: Pt Will Go Supine/Side To Sit Outcome: Progressing Flowsheets (Taken 05/25/2023 1007) Pt will go Supine/Side to Sit: Independently Goal: Patient Will Transfer Sit To/From Stand Outcome: Progressing Flowsheets (Taken 05/25/2023 1007) Patient will transfer sit to/from stand: Independently Goal: Pt Will Transfer Bed To Chair/Chair To Bed Outcome: Progressing Flowsheets (Taken 05/25/2023 1007) Pt will Transfer Bed to Chair/Chair to Bed: Independently Goal: Pt Will Ambulate Outcome: Progressing Flowsheets (Taken 05/25/2023 1007) Pt will Ambulate: (with increased gait speed)  > 125 feet  Independently   Luz Lex, PT, DPT [facility] Office: [phone]

## 2023-05-25 NOTE — Assessment & Plan Note (Signed)
 05-25-2023 on SSI. Diabetic diet.

## 2023-05-25 NOTE — Progress Notes (Signed)
 Nsg Discharge Note  Admit Date:  05/24/2023 Discharge date: 05/25/2023   SIRENITY SHEW to be D/C'd Home per MD order.  AVS completed.  Copy for chart, and copy for patient signed, and dated. Patient/caregiver able to verbalize understanding.  Discharge Medication: Allergies as of 05/25/2023       Reactions   Codeine Shortness Of Breath   Sulfa Antibiotics Other (See Comments)   Unknown    Latex Itching        Medication List     STOP taking these medications    promethazine 25 MG tablet Commonly known as: PHENERGAN   rosuvastatin 20 MG tablet Commonly known as: CRESTOR       TAKE these medications    Accu-Chek Guide test strip Generic drug: glucose blood Use as instructed   acetaminophen 325 MG tablet Commonly known as: TYLENOL Take 2 tablets (650 mg total) by mouth every 4 (four) hours as needed for headache or mild pain.   acidophilus Caps capsule Take 2 capsules by mouth 3 (three) times daily for 5 days.   albuterol 108 (90 Base) MCG/ACT inhaler Commonly known as: VENTOLIN HFA Inhale 1-2 puffs into the lungs every 4 (four) hours as needed for shortness of breath or wheezing.   ALPRAZolam 0.5 MG tablet Commonly known as: XANAX Take 0.25 mg by mouth 2 (two) times daily.   atorvastatin 40 MG tablet Commonly known as: LIPITOR Take 40 mg by mouth daily.   Breztri Aerosphere 160-9-4.8 MCG/ACT Aero Generic drug: budeson-glycopyrrolate-formoterol Inhale into the lungs. 1-2 times per day   calcitRIOL 0.25 MCG capsule Commonly known as: ROCALTROL Take 0.25 mcg by mouth every Monday, Wednesday, and Friday.   calcium acetate 667 MG tablet Commonly known as: PHOSLO Take 667 mg by mouth daily.   clopidogrel 75 MG tablet Commonly known as: PLAVIX Take 1 tablet (75 mg total) by mouth daily.   escitalopram 20 MG tablet Commonly known as: LEXAPRO Take 20 mg by mouth daily.   gabapentin 300 MG capsule Commonly known as: NEURONTIN Take 300 mg by mouth  every other day. At night   guaiFENesin-dextromethorphan 100-10 MG/5ML syrup Commonly known as: ROBITUSSIN DM Take 10 mLs by mouth every 8 (eight) hours.   Lokelma 10 g Pack packet Generic drug: sodium zirconium cyclosilicate Take 10 g by mouth See admin instructions. Five times a week   NovoLOG FlexPen 100 UNIT/ML FlexPen Generic drug: insulin aspart Inject 5 Units into the skin 3 (three) times daily with meals.   nystatin cream Commonly known as: MYCOSTATIN Apply 1 application  topically 2 (two) times daily as needed (Itching).   ondansetron 4 MG tablet Commonly known as: ZOFRAN Take 1 tablet (4 mg total) by mouth every 6 (six) hours as needed for nausea or vomiting. What changed:  how much to take how to take this when to take this reasons to take this additional instructions   oxyCODONE-acetaminophen 5-325 MG tablet Commonly known as: PERCOCET/ROXICET Take 1 tablet by mouth every 6 (six) hours as needed for severe pain or moderate pain.   pantoprazole 40 MG tablet Commonly known as: PROTONIX Take 1 tablet (40 mg total) by mouth daily.   Pen Needles 31G X 6 MM Misc Use to inject insulin once daily   sodium bicarbonate 650 MG tablet Take 650 mg by mouth 2 (two) times daily.   Evaristo Bury FlexTouch 100 UNIT/ML FlexTouch Pen Generic drug: insulin degludec Inject 10 Units into the skin at bedtime.   Vitamin D3 125 MCG (  5000 UT) Caps Take 5,000 Units by mouth once a week. Tuesday        Discharge Assessment: Vitals:   05/25/23 0958 05/25/23 1354  BP:  (!) 135/51  Pulse:  64  Resp:    Temp:  98.7 F (37.1 C)  SpO2: 98% 96%   Skin clean, dry and intact without evidence of skin break down, no evidence of skin tears noted. IV catheter discontinued intact. Site without signs and symptoms of complications - no redness or edema noted at insertion site, patient denies c/o pain - only slight tenderness at site.  Dressing with slight pressure applied.  D/c  Instructions-Education: Discharge instructions given to patient/family with verbalized understanding. D/c education completed with patient/family including follow up instructions, medication list, d/c activities limitations if indicated, with other d/c instructions as indicated by MD - patient able to verbalize understanding, all questions fully answered. Patient instructed to return to ED, call 911, or call MD for any changes in condition.  Patient escorted via WC, and D/C home via private auto.  Demetrio Lapping, LPN 5/78/4696 2:95 PM

## 2023-05-25 NOTE — Progress Notes (Signed)
   Pt and husband ready for DC. Pt ate lunch without difficulty. No further diarrhea. Ready to go home.  Carollee Herter, DO [facility]

## 2023-05-25 NOTE — Assessment & Plan Note (Signed)
 05-25-2023 stable. No chest pain.

## 2023-05-25 NOTE — Assessment & Plan Note (Signed)
 05-25-2023 stable.

## 2023-05-25 NOTE — Plan of Care (Signed)

## 2023-05-25 NOTE — TOC Initial Note (Signed)
 Transition of Care [facility]) - Initial/Assessment Note    Patient Details  Name: Grace Hess MRN: [id number] Date of Birth: 1956/03/04  Transition of Care [facility]) CM/SW Contact:    Isabella Bowens, LCSWA Phone Number: 05/25/2023, 1:09 PM  Clinical Narrative:       Patient is at risk for readmission . Patient was admitted for a headache. Patient was getting her EKG , so spoke with spouse. Spouse shared that they have access to a walker , that pt can still drive, and do for herself. PT did recommend OPPT and spouse declined stating that right now they will hold off on that and follow up patient PCP. TOC will continue to follow.    Expected Discharge Plan: Home/Self Care Barriers to Discharge: Continued Medical Work up   Patient Goals and CMS Choice Patient states their goals for this hospitalization and ongoing recovery are:: return back home CMS Medicare.gov Compare Post Acute Care list provided to:: Patient Represenative (must comment) (Spouse-William) Choice offered to / list presented to : Spouse      Expected Discharge Plan and Services In-house Referral: Clinical Social Work   Post Acute Care Choice: Durable Medical Equipment Living arrangements for the past 2 months: Single Family Home                   DME Agency: [employer]       HH Arranged:  (Patient refused OPPT)          Prior Living Arrangements/Services Living arrangements for the past 2 months: Single Family Home Lives with:: Relatives, Spouse Patient language and need for interpreter reviewed:: Yes Do you feel safe going back to the place where you live?: Yes      Need for Family Participation in Patient Care: Yes (Comment) Care giver support system in place?: Yes (comment)   Criminal Activity/Legal Involvement Pertinent to Current Situation/Hospitalization: No - Comment as needed  Activities of Daily Living   ADL Screening (condition at time of admission) Independently performs ADLs?: Yes  (appropriate for developmental age) Is the patient deaf or have difficulty hearing?: No Does the patient have difficulty seeing, even when wearing glasses/contacts?: No Does the patient have difficulty concentrating, remembering, or making decisions?: No  Permission Sought/Granted      Share Information with NAME: Chrissie Noa     Permission granted to share info w Relationship: Spouse     Emotional Assessment Appearance:: Appears stated age Attitude/Demeanor/Rapport: Engaged Affect (typically observed): Appropriate Orientation: : Oriented to Self, Oriented to Place, Oriented to  Time, Oriented to Situation Alcohol / Substance Use: Tobacco Use Psych Involvement: No (comment)  Admission diagnosis:  Dehydration [E86.0] Weakness [R53.1] Headache [R51.9] Patient Active Problem List   Diagnosis Date Noted   Iron deficiency anemia 01/18/2023   Diarrhea 11/02/2022   Renal cell adenocarcinoma (HCC) 11/25/2021   Renal cell cancer, left (HCC) 10/01/2021   Vitamin D deficiency 08/23/2019   Loss of weight 07/17/2019   PAD (peripheral artery disease) (HCC) 07/04/2019   IBS (irritable bowel syndrome) 01/16/2019   Uncontrolled type 2 diabetes mellitus with hyperglycemia, without long-term current use of insulin (HCC) 01/08/2019   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) 12/21/2018   Lumbar back pain 12/21/2018   Mixed hyperlipidemia 12/21/2018   CAD S/P percutaneous coronary angioplasty 08/03/2016   Claudication (HCC) 08/02/2016   Stable angina (HCC) 08/02/2016   Heavy smoker (more than 20 cigarettes per day)    Headache 12/10/2014  Carotid artery disease (HCC) 12/01/2014   Tobacco use disorder 11/28/2014   HYPERCHOLESTEROLEMIA  IIA 09/13/2008   Depression with anxiety 09/13/2008   Essential hypertension, benign 09/13/2008   GERD (gastroesophageal reflux disease) 09/13/2008   PCP:  Elfredia Nevins, MD Pharmacy:   [facility] [phone]  - [place], [place] - [address] AT [facility] [address] [place] [address] Phone: [phone] Fax: [phone]     Social Drivers of Health (SDOH) Social History: SDOH Screenings   Food Insecurity: No Food Insecurity (05/24/2023)  Housing: Low Risk  (05/24/2023)  Transportation Needs: No Transportation Needs (05/24/2023)  Utilities: Not At Risk (05/20/2023)  Depression (PHQ2-9): Low Risk  (12/21/2018)  Financial Resource Strain: Low Risk  (05/18/2022)   Received from [facility], [facility]  Social Connections: Moderately Isolated (05/20/2023)  Tobacco Use: High Risk (05/24/2023)   SDOH Interventions:     Readmission Risk Interventions    05/21/2023   11:58 AM  Readmission Risk Prevention Plan  Transportation Screening Complete  HRI or Home Care Consult Complete  Social Work Consult for Recovery Care Planning/Counseling Complete  Palliative Care Screening Not Applicable  Medication Review Oceanographer) Complete

## 2023-05-26 LAB — URINE CULTURE: Culture: <10000 — AB

## 2023-05-27 ENCOUNTER — Other Ambulatory Visit: Payer: Self-pay | Admitting: *Deleted

## 2023-05-30 NOTE — Progress Notes (Unsigned)
 [facility] [place]  ASSESSMENT / PLAN: Grace Hess is a 68 y.o. female with history of left carotid endarterectomy 12/02/14 with Dr. Arbie Cookey for symptomatic stenosis. Right 70-99% carotid artery stenosis.   Recommend:  Abstinence from all tobacco products. Blood glucose control with goal A1c < 7%. Blood pressure control with goal blood pressure < 140/90 mmHg. Lipid reduction therapy with goal LDL-C <100 mg/dL  Aspirin 81mg  PO QD.  Atorvastatin 40-80mg  PO QD (or other "high intensity" statin therapy).  Check CT angiogram of head/neck. Follow up after scan has been completed.  CHIEF COMPLAINT: Follow-up for cerebrovascular disease  HISTORY OF PRESENT ILLNESS: Grace Hess is a 68 y.o. female referred to clinic for worsening carotid artery stenosis. The patient is asymptomatic from a carotid artery standpoint.  She denies any amaurosis, facial droop, difficulty speaking, unilateral weakness or numbness.  The patient did undergo a left carotid endarterectomy in 2016 with Dr. Arbie Cookey which she recovered from nicely.  We reviewed her most recent duplex.  I reviewed the rationale for asymptomatic intervention at 80%.  I reviewed the difficulty with a duplex reported as 70 to 99%.   Past Medical History:  Diagnosis Date   Anxiety    CAD (coronary artery disease)    a. s/p DES to RCA in 07/2016 with residual mid-LAD stenosis   Cancer (HCC)    skin cancer on finger,, cancer left kidney   Cervicalgia    Chest pain, unspecified    Chronic kidney disease    Depression    GERD (gastroesophageal reflux disease)    Headache    "bad ones after my stroke in 2016; only have them when I get bad news now" (08/02/2016)   Heart murmur    Heavy smoker (more than 20 cigarettes per day)    Scheduled for LDCT screening 02/11/15   History of hiatal hernia    History of kidney stones    "no cysto/OR" (12/10/2014)   Hypercholesteremia    Hypertension    Incarcerated incisional  hernia 08/05/2022   Left renal mass    Obesity    Palpitations    Reflux esophagitis    Stroke [facility])    "they said I had a  MINI stroke a few months back/MRI" (12/10/2014)   TIA (transient ischemic attack) 11/28/2014   [name] 11/28/2014   Type II diabetes mellitus (HCC)     Past Surgical History:  Procedure Laterality Date   ABDOMINAL AORTOGRAM N/A 08/02/2016   Procedure: Abdominal Aortogram;  Surgeon: Yvonne Kendall, MD;  Location: [facility];  Service: Cardiovascular;  Laterality: N/A;   CAROTID STENT     CHOLECYSTECTOMY OPEN  1978   COLONOSCOPY  2010   single diverticulum in sigmoid colon   CORONARY ANGIOPLASTY WITH STENT PLACEMENT  08/02/2016   to RCA   CORONARY STENT INTERVENTION N/A 08/02/2016   Procedure: Coronary Stent Intervention;  Surgeon: Yvonne Kendall, MD;  Location: [facility];  Service: Cardiovascular;  Laterality: N/A;  RCA   CYSTOSCOPY W/ RETROGRADES Bilateral 07/15/2021   Procedure: CYSTOSCOPY WITH RETROGRADE PYELOGRAM;  Surgeon: Milderd Meager., MD;  Location: [facility];  Service: Urology;  Laterality: Bilateral;   CYSTOSCOPY WITH STENT PLACEMENT Left 07/15/2021   Procedure: CYSTOSCOPY WITH STENT PLACEMENT;  Surgeon: Milderd Meager., MD;  Location: [facility];  Service: Urology;  Laterality: Left;   ENDARTERECTOMY Left 12/02/2014   Procedure: ENDARTERECTOMY CAROTID;  Surgeon: Larina Earthly, MD;  Location: [facility] OR;  Service:  Vascular;  Laterality: Left;   HERNIA REPAIR     LAPAROSCOPIC NEPHRECTOMY Left    LAPAROSCOPIC NEPHRECTOMY Left 11/25/2021   Procedure: LEFT LAPAROSCOPIC RADICAL NEPHRECTOMY;  Surgeon: Crist Fat, MD;  Location: [facility];  Service: Urology;  Laterality: Left;   LEFT HEART CATH AND CORONARY ANGIOGRAPHY N/A 08/02/2016   Procedure: Left Heart Cath and Coronary Angiography;  Surgeon: Yvonne Kendall, MD;  Location: [facility];  Service: Cardiovascular;  Laterality: N/A;   LOWER EXTREMITY ANGIOGRAM  08/02/2016    LOWER EXTREMITY ANGIOGRAPHY N/A 08/02/2016   Procedure: Lower Extremity Angiography;  Surgeon: Yvonne Kendall, MD;  Location: [facility];  Service: Cardiovascular;  Laterality: N/A;   NEPHRECTOMY Left    VAGINAL HYSTERECTOMY  1991   "partial"   VENTRAL HERNIA REPAIR N/A 08/05/2022   Procedure: LAPAROSCOPIC VENTRAL WALL HERNIA REPAIR WITH MESH;  Surgeon: Karie Soda, MD;  Location: [facility];  Service: General;  Laterality: N/A;    Family History  Problem Relation Age of Onset   Congestive Heart Failure Mother    COPD Father    Cancer Brother    Cancer Brother    Breast cancer Neg Hx     Social History   Socioeconomic History   Marital status: Married    Spouse name: Not on file   Number of children: 2   Years of education: 12   Highest education level: Not on file  Occupational History   Not on file  Tobacco Use   Smoking status: Every Day    Current packs/day: 1.50    Average packs/day: 1.5 packs/day for 49.0 years (73.6 ttl pk-yrs)    Types: Cigarettes    Start date: 05/17/1974   Smokeless tobacco: Never  Vaping Use   Vaping status: Never Used  Substance and Sexual Activity   Alcohol use: No    Alcohol/week: 0.0 standard drinks of alcohol   Drug use: No   Sexual activity: Not Currently  Other Topics Concern   Not on file  Social History Narrative   Patient does not drink caffeine.   Patient is right handed.    Social Drivers of Corporate investment banker Strain: Low Risk  (05/18/2022)   [facility]   Overall Financial Resource Strain (CARDIA)    Difficulty of Paying Living Expenses: Not hard at all  Food Insecurity: No Food Insecurity (05/24/2023)   Hunger Vital Sign    Worried About Running Out of Food in the Last Year: Never true    Ran Out of Food in the Last Year: Never true  Transportation Needs: No Transportation Needs (05/24/2023)   PRAPARE - Administrator, Civil Service (Medical): No    Lack of  Transportation (Non-Medical): No  Physical Activity: Not on file  Stress: Not on file  Social Connections: Moderately Isolated (05/20/2023)   Social Connection and Isolation Panel [NHANES]    Frequency of Communication with Friends and Family: Once a week    Frequency of Social Gatherings with Friends and Family: Once a week    Attends Religious Services: 1 to 4 times per year    Active Member of [employer] or Organizations: No    Attends Banker Meetings: Never    Marital Status: Married  Catering manager Violence: Not At Risk (05/24/2023)   Humiliation, Afraid, Rape, and Kick questionnaire    Fear of Current or Ex-Partner: No    Emotionally Abused: No    Physically  Abused: No    Sexually Abused: No    Allergies  Allergen Reactions   Codeine Shortness Of Breath   Sulfa Antibiotics Other (See Comments)    Unknown    Latex Itching    Current Outpatient Medications  Medication Sig Dispense Refill   acetaminophen (TYLENOL) 325 MG tablet Take 2 tablets (650 mg total) by mouth every 4 (four) hours as needed for headache or mild pain.     albuterol (VENTOLIN HFA) 108 (90 Base) MCG/ACT inhaler Inhale 1-2 puffs into the lungs every 4 (four) hours as needed for shortness of breath or wheezing.     ALPRAZolam (XANAX) 0.5 MG tablet Take 0.25 mg by mouth 2 (two) times daily.     atorvastatin (LIPITOR) 40 MG tablet Take 40 mg by mouth daily.     Budeson-Glycopyrrol-Formoterol (BREZTRI AEROSPHERE) 160-9-4.8 MCG/ACT AERO Inhale into the lungs. 1-2 times per day     calcitRIOL (ROCALTROL) 0.25 MCG capsule Take 0.25 mcg by mouth every Monday, Wednesday, and Friday.     calcium acetate (PHOSLO) 667 MG tablet Take 667 mg by mouth daily.     Cholecalciferol (VITAMIN D3) 125 MCG (5000 UT) CAPS Take 5,000 Units by mouth once a week. Tuesday     clopidogrel (PLAVIX) 75 MG tablet Take 1 tablet (75 mg total) by mouth daily. 30 tablet 3   escitalopram (LEXAPRO) 20 MG tablet Take 20 mg by mouth  daily.     gabapentin (NEURONTIN) 300 MG capsule Take 300 mg by mouth every other day. At night     glucose blood (ACCU-CHEK GUIDE) test strip Use as instructed 150 each 2   guaiFENesin-dextromethorphan (ROBITUSSIN DM) 100-10 MG/5ML syrup Take 10 mLs by mouth every 8 (eight) hours. 118 mL 0   insulin aspart (NOVOLOG FLEXPEN) 100 UNIT/ML FlexPen Inject 5 Units into the skin 3 (three) times daily with meals. 15 mL 3   insulin degludec (TRESIBA FLEXTOUCH) 100 UNIT/ML FlexTouch Pen Inject 10 Units into the skin at bedtime. 9 mL 3   Insulin Pen Needle (PEN NEEDLES) 31G X 6 MM MISC Use to inject insulin once daily 100 each 3   LOKELMA 10 g PACK packet Take 10 g by mouth See admin instructions. Five times a week     nystatin cream (MYCOSTATIN) Apply 1 application  topically 2 (two) times daily as needed (Itching).  5   ondansetron (ZOFRAN) 4 MG tablet Take 1 tablet (4 mg total) by mouth every 6 (six) hours as needed for nausea or vomiting. 60 tablet 0   oxyCODONE-acetaminophen (PERCOCET/ROXICET) 5-325 MG tablet Take 1 tablet by mouth every 6 (six) hours as needed for severe pain or moderate pain. 30 tablet 0   pantoprazole (PROTONIX) 40 MG tablet Take 1 tablet (40 mg total) by mouth daily. 90 tablet 3   sodium bicarbonate 650 MG tablet Take 650 mg by mouth 2 (two) times daily.     No current facility-administered medications for this visit.    PHYSICAL EXAM There were no vitals filed for this visit.   Well-appearing elderly woman in no distress Regular rate and rhythm Unlabored breathing No cranial nerve abnormalities Well-healed left neck incision Normal gait and station   PERTINENT LABORATORY AND RADIOLOGIC DATA  Most recent CBC    Latest Ref Rng & Units 05/24/2023    9:36 AM 05/22/2023    4:32 AM 05/21/2023    4:42 AM  CBC  WBC 4.0 - 10.5 K/uL 12.7  11.1  10.7   Hemoglobin  12.0 - 15.0 g/dL 44.0  9.8  10.2   Hematocrit 36.0 - 46.0 % 38.3  30.5  31.6   Platelets 150 - 400 K/uL 385  325   335      Most recent CMP    Latest Ref Rng & Units 05/24/2023    9:36 AM 05/22/2023    4:32 AM 05/21/2023    4:42 AM  CMP  Glucose 70 - 99 mg/dL 725  366  440   BUN 8 - 23 mg/dL 52  58  62   Creatinine 0.44 - 1.00 mg/dL 3.47  4.25  9.56   Sodium 135 - 145 mmol/L 136  135  134   Potassium 3.5 - 5.1 mmol/L 4.6  4.7  4.8   Chloride 98 - 111 mmol/L 103  103  108   CO2 22 - 32 mmol/L 21  21  21    Calcium 8.9 - 10.3 mg/dL 9.0  8.7  8.2   Total Protein 6.5 - 8.1 g/dL 6.5     Total Bilirubin 0.0 - 1.2 mg/dL 1.1     Alkaline Phos 38 - 126 U/L 65     AST 15 - 41 U/L 15     ALT 0 - 44 U/L 17       Renal function Estimated Creatinine Clearance: 19.5 mL/min (A) (by C-G formula based on SCr of 2.48 mg/dL (H)).  Hemoglobin A1C (%)  Date Value  05/10/2023 14.6 (A)   HbA1c POC (<> result, manual entry) (%)  Date Value  10/01/2021 8.4   Hgb A1c MFr Bld (%)  Date Value  07/27/2022 9.5 (H)    LDL Cholesterol (Calc)  Date Value Ref Range Status  01/16/2019 69 mg/dL (calc) Final    Comment:    Reference range: <100 . Desirable range <100 mg/dL for primary prevention;   <70 mg/dL for patients with CHD or diabetic patients  with > or = 2 CHD risk factors. Marland Kitchen LDL-C is now calculated using the Martin-Hopkins  calculation, which is a validated novel method providing  better accuracy than the Friedewald equation in the  estimation of LDL-C.  Horald Pollen et al. Lenox Ahr. 3875;643(32): 2061-2068  (http://education.[facility].com/faq/FAQ164)    LDL Chol Calc (NIH)  Date Value Ref Range Status  03/30/2021 90 0 - 99 mg/dL Final   LDL Cholesterol  Date Value Ref Range Status  05/25/2023 76 0 - 99 mg/dL Final    Comment:           Total Cholesterol/HDL:CHD Risk Coronary Heart Disease Risk Table                     Men   Women  1/2 Average Risk   3.4   3.3  Average Risk       5.0   4.4  2 X Average Risk   9.6   7.1  3 X Average Risk  23.4   11.0        Use the calculated Patient  Ratio above and the CHD Risk Table to determine the patient's CHD Risk.        ATP III CLASSIFICATION (LDL):  <100     mg/dL   Optimal  951-884  mg/dL   Near or Above                    Optimal  130-159  mg/dL   Borderline  166-063  mg/dL   High  >016  mg/dL   Very High Performed at [facility], [address], [place], [place] [address]     Carotid Duplex 03/14/23  Right:   Heterogeneous and partially calcified plaque at the right carotid bifurcation, with discordant results regarding degree of stenosis by established duplex criteria. Peak velocity suggests 70% - 99% stenosis, with the ICA/ CCA ratio suggesting a lesser degree of stenosis. If establishing a more accurate degree of stenosis is required, cerebral angiogram should be considered, or as a second best test, CTA.   Left:   Surgical changes of prior left carotid endarterectomy. Note that established duplex criteria have not been validated in the setting of prior endarterectomy, however, there is no duplex evidence of recurrent stenosis at the left bifurcation.  Rande Brunt. Lenell Antu, MD [facility] Phone Number: [phone] 05/30/2023 8:36 PM   Total time spent on preparing this encounter including chart review, data review, collecting history, examining the patient, coordinating care for this new patient, 60 minutes.  Portions of this report may have been transcribed using voice recognition software.  Every effort has been made to ensure accuracy; however, inadvertent computerized transcription errors may still be present.

## 2023-05-31 ENCOUNTER — Encounter (HOSPITAL_COMMUNITY)

## 2023-05-31 ENCOUNTER — Ambulatory Visit (INDEPENDENT_AMBULATORY_CARE_PROVIDER_SITE_OTHER)

## 2023-05-31 ENCOUNTER — Ambulatory Visit (INDEPENDENT_AMBULATORY_CARE_PROVIDER_SITE_OTHER): Payer: PPO | Admitting: Vascular Surgery

## 2023-05-31 DIAGNOSIS — I6523 Occlusion and stenosis of bilateral carotid arteries: Secondary | ICD-10-CM

## 2023-06-01 DIAGNOSIS — I10 Essential (primary) hypertension: Secondary | ICD-10-CM | POA: Diagnosis not present

## 2023-06-01 DIAGNOSIS — G894 Chronic pain syndrome: Secondary | ICD-10-CM | POA: Diagnosis not present

## 2023-06-01 DIAGNOSIS — I739 Peripheral vascular disease, unspecified: Secondary | ICD-10-CM | POA: Diagnosis not present

## 2023-06-01 DIAGNOSIS — M1991 Primary osteoarthritis, unspecified site: Secondary | ICD-10-CM | POA: Diagnosis not present

## 2023-06-01 DIAGNOSIS — K219 Gastro-esophageal reflux disease without esophagitis: Secondary | ICD-10-CM | POA: Diagnosis not present

## 2023-06-01 DIAGNOSIS — F411 Generalized anxiety disorder: Secondary | ICD-10-CM | POA: Diagnosis not present

## 2023-06-01 DIAGNOSIS — Z6824 Body mass index (BMI) 24.0-24.9, adult: Secondary | ICD-10-CM | POA: Diagnosis not present

## 2023-06-01 DIAGNOSIS — J441 Chronic obstructive pulmonary disease with (acute) exacerbation: Secondary | ICD-10-CM | POA: Diagnosis not present

## 2023-06-01 DIAGNOSIS — N184 Chronic kidney disease, stage 4 (severe): Secondary | ICD-10-CM | POA: Diagnosis not present

## 2023-06-03 ENCOUNTER — Other Ambulatory Visit: Payer: Self-pay | Admitting: *Deleted

## 2023-06-03 DIAGNOSIS — I6523 Occlusion and stenosis of bilateral carotid arteries: Secondary | ICD-10-CM

## 2023-06-04 ENCOUNTER — Emergency Department (HOSPITAL_COMMUNITY)
Admission: EM | Admit: 2023-06-04 | Discharge: 2023-06-04 | Disposition: A | Discharge status: Home / Self Care | Attending: Student | Admitting: Student

## 2023-06-04 ENCOUNTER — Other Ambulatory Visit: Payer: Self-pay

## 2023-06-04 ENCOUNTER — Encounter (HOSPITAL_COMMUNITY): Payer: Self-pay | Admitting: *Deleted

## 2023-06-04 DIAGNOSIS — E1122 Type 2 diabetes mellitus with diabetic chronic kidney disease: Secondary | ICD-10-CM | POA: Insufficient documentation

## 2023-06-04 DIAGNOSIS — Z8673 Personal history of transient ischemic attack (TIA), and cerebral infarction without residual deficits: Secondary | ICD-10-CM | POA: Diagnosis not present

## 2023-06-04 DIAGNOSIS — I129 Hypertensive chronic kidney disease with stage 1 through stage 4 chronic kidney disease, or unspecified chronic kidney disease: Secondary | ICD-10-CM | POA: Insufficient documentation

## 2023-06-04 DIAGNOSIS — R2240 Localized swelling, mass and lump, unspecified lower limb: Secondary | ICD-10-CM | POA: Diagnosis not present

## 2023-06-04 DIAGNOSIS — Z79899 Other long term (current) drug therapy: Secondary | ICD-10-CM | POA: Insufficient documentation

## 2023-06-04 DIAGNOSIS — M7989 Other specified soft tissue disorders: Secondary | ICD-10-CM

## 2023-06-04 DIAGNOSIS — I2510 Atherosclerotic heart disease of native coronary artery without angina pectoris: Secondary | ICD-10-CM | POA: Insufficient documentation

## 2023-06-04 DIAGNOSIS — Z91040 Latex allergy status: Secondary | ICD-10-CM | POA: Diagnosis not present

## 2023-06-04 DIAGNOSIS — R600 Localized edema: Secondary | ICD-10-CM | POA: Insufficient documentation

## 2023-06-04 DIAGNOSIS — F17210 Nicotine dependence, cigarettes, uncomplicated: Secondary | ICD-10-CM | POA: Diagnosis not present

## 2023-06-04 DIAGNOSIS — Z794 Long term (current) use of insulin: Secondary | ICD-10-CM | POA: Diagnosis not present

## 2023-06-04 DIAGNOSIS — Z85828 Personal history of other malignant neoplasm of skin: Secondary | ICD-10-CM | POA: Diagnosis not present

## 2023-06-04 DIAGNOSIS — N189 Chronic kidney disease, unspecified: Secondary | ICD-10-CM | POA: Diagnosis not present

## 2023-06-04 LAB — COMPREHENSIVE METABOLIC PANEL
ALT: 17 U/L (ref 0–44)
AST: 15 U/L (ref 15–41)
Albumin: 2.7 g/dL — ABNORMAL LOW (ref 3.5–5.0)
Alkaline Phosphatase: 76 U/L (ref 38–126)
Anion gap: 12 (ref 5–15)
BUN: 38 mg/dL — ABNORMAL HIGH (ref 8–23)
CO2: 24 mmol/L (ref 22–32)
Calcium: 7.8 mg/dL — ABNORMAL LOW (ref 8.9–10.3)
Chloride: 103 mmol/L (ref 98–111)
Creatinine, Ser: 2.49 mg/dL — ABNORMAL HIGH (ref 0.44–1.00)
GFR, Estimated: 21 mL/min — ABNORMAL LOW (ref >60)
Glucose, Bld: 188 mg/dL — ABNORMAL HIGH (ref 70–99)
Potassium: 5.1 mmol/L (ref 3.5–5.1)
Sodium: 139 mmol/L (ref 135–145)
Total Bilirubin: 0.4 mg/dL (ref 0.0–1.2)
Total Protein: 5.9 g/dL — ABNORMAL LOW (ref 6.5–8.1)

## 2023-06-04 LAB — CBC WITH DIFFERENTIAL/PLATELET
Abs Immature Granulocytes: 0.06 10*3/uL (ref 0.00–0.07)
Basophils Absolute: 0.1 10*3/uL (ref 0.0–0.1)
Basophils Relative: 1 %
Eosinophils Absolute: 0.2 10*3/uL (ref 0.0–0.5)
Eosinophils Relative: 3 %
HCT: 30.9 % — ABNORMAL LOW (ref 36.0–46.0)
Hemoglobin: 9.7 g/dL — ABNORMAL LOW (ref 12.0–15.0)
Immature Granulocytes: 1 %
Lymphocytes Relative: 16 %
Lymphs Abs: 1.4 10*3/uL (ref 0.7–4.0)
MCH: 29.9 pg (ref 26.0–34.0)
MCHC: 31.4 g/dL (ref 30.0–36.0)
MCV: 95.4 fL (ref 80.0–100.0)
Monocytes Absolute: 0.7 10*3/uL (ref 0.1–1.0)
Monocytes Relative: 9 %
Neutro Abs: 6 10*3/uL (ref 1.7–7.7)
Neutrophils Relative %: 70 %
Platelets: 230 10*3/uL (ref 150–400)
RBC: 3.24 MIL/uL — ABNORMAL LOW (ref 3.87–5.11)
RDW: 14.6 % (ref 11.5–15.5)
WBC: 8.4 10*3/uL (ref 4.0–10.5)
nRBC: 0 % (ref 0.0–0.2)

## 2023-06-04 LAB — D-DIMER, QUANTITATIVE: D-Dimer, Quant: 1.02 ug{FEU}/mL — ABNORMAL HIGH (ref 0.00–0.50)

## 2023-06-04 LAB — BRAIN NATRIURETIC PEPTIDE: B Natriuretic Peptide: 566 pg/mL — ABNORMAL HIGH (ref 0.0–100.0)

## 2023-06-04 MED ORDER — ENOXAPARIN SODIUM 60 MG/0.6ML IJ SOSY
1.0000 mg/kg | PREFILLED_SYRINGE | INTRAMUSCULAR | Status: DC
  Administered 2023-06-04: 62.5 mg via SUBCUTANEOUS
  Filled 2023-06-04: qty 1.2

## 2023-06-04 NOTE — Consult Note (Signed)
 PHARMACY - ANTICOAGULATION CONSULT NOTE  Pharmacy Consult for Lovenox Indication:  VTE treatment  Allergies  Allergen Reactions   Codeine Shortness Of Breath   Sulfa Antibiotics Other (See Comments)    Unknown    Latex Itching    Patient Measurements: Weight: 62.1 kg (137 lb)  Vital Signs: Temp: 98.2 F (36.8 C) (03/22 1735) Temp Source: Oral (03/22 1735) BP: 174/73 (03/22 1735) Pulse Rate: 85 (03/22 1735)  Labs: Recent Labs    06/04/23 1757  HGB 9.7*  HCT 30.9*  PLT 230  CREATININE 2.49*    Estimated Creatinine Clearance: 19.5 mL/min (A) (by C-G formula based on SCr of 2.49 mg/dL (H)).   Medical History: Past Medical History:  Diagnosis Date   Anxiety    CAD (coronary artery disease)    a. s/p DES to RCA in 07/2016 with residual mid-LAD stenosis   Cancer (HCC)    skin cancer on finger,, cancer left kidney   Cervicalgia    Chest pain, unspecified    Chronic kidney disease    Depression    GERD (gastroesophageal reflux disease)    Headache    "bad ones after my stroke in 2016; only have them when I get bad news now" (08/02/2016)   Heart murmur    Heavy smoker (more than 20 cigarettes per day)    Scheduled for LDCT screening 02/11/15   History of hiatal hernia    History of kidney stones    "no cysto/OR" (12/10/2014)   Hypercholesteremia    Hypertension    Incarcerated incisional hernia 08/05/2022   Left renal mass    Obesity    Palpitations    Reflux esophagitis    Stroke [facility])    "they said I had a  MINI stroke a few months back/MRI" (12/10/2014)   TIA (transient ischemic attack) 11/28/2014   [name] 11/28/2014   Type II diabetes mellitus (HCC)     Medications:  NO AC prior to admission  Assessment: Patient admitted with leg swelling. Pharmacy consulted for lovenox treatment dose. Noted CDK with CrCl 19.39ml/min but no ESRD.    Plan:  Lovenox 1mg /kg q 24hrs Follow transition to oral AC CBC at lest every 3 days  Jashay Roddy Rodriguez-Guzman  PharmD, BCPS 06/04/2023 7:31 PM

## 2023-06-04 NOTE — ED Triage Notes (Addendum)
 BIB spouse from home for BLE leg swelling, L>R, with associated tightness, and HA. PCP instructed pt to come get checked out. Onset 6d ago. Denies CP, sob, fever, rash, NVD, weakness, fall, injury or dizziness. Steady gait.  Recent flu+ and PNA.

## 2023-06-04 NOTE — Discharge Instructions (Signed)
 You were seen in the emergency room for evaluation of asymmetric leg swelling.  We did a blood test today that shows that you might have a blood clot in the leg but the only way to know for sure is to have an ultrasound.  Fortunately we do not have ultrasound coverage until Monday morning.  This ultrasound has been ordered.  Please return to the emergency department lobby and tell the front desk you are here to receive an ultrasound.  We will cover you with a shot of a blood thinner to prevent any potential clot from getting bigger.  Return to emergency room if you have any chest pain,shortness of breath, fever or any other concerning symptoms

## 2023-06-04 NOTE — ED Provider Notes (Signed)
 [place] EMERGENCY DEPARTMENT AT [facility] Provider Note  CSN: [id number] Arrival date & time: 06/04/23 1730  Chief Complaint(s) Leg Swelling  HPI Grace Hess is a 68 y.o. female with PMH CAD status post MI with DES placement, CKD, HTN, HLD, TIA, T2DM who presents emergency room for evaluation of asymmetric leg swelling.  States that symptoms have been developing for the last 6 days.  No associated pain in the legs, shortness of breath, chest pain, or other systemic symptoms.  Denies long car ride or recent injury to the leg.  Patient recently tested positive for the flu.  She spoke with her nephrologist who encouraged her to come the emergency room for further evaluation.  Here in the ER, she is asymptomatic denying any chest pain, shortness of breath, abdominal pain, nausea vomiting, headache, fever or other systemic symptoms.   Past Medical History Past Medical History:  Diagnosis Date   Anxiety    CAD (coronary artery disease)    a. s/p DES to RCA in 07/2016 with residual mid-LAD stenosis   Cancer (HCC)    skin cancer on finger,, cancer left kidney   Cervicalgia    Chest pain, unspecified    Chronic kidney disease    Depression    GERD (gastroesophageal reflux disease)    Headache    "bad ones after my stroke in 2016; only have them when I get bad news now" (08/02/2016)   Heart murmur    Heavy smoker (more than 20 cigarettes per day)    Scheduled for LDCT screening 02/11/15   History of hiatal hernia    History of kidney stones    "no cysto/OR" (12/10/2014)   Hypercholesteremia    Hypertension    Incarcerated incisional hernia 08/05/2022   Left renal mass    Obesity    Palpitations    Reflux esophagitis    Stroke [facility])    "they said I had a  MINI stroke a few months back/MRI" (12/10/2014)   TIA (transient ischemic attack) 11/28/2014   [name] 11/28/2014   Type II diabetes mellitus (HCC)    Patient Active Problem List   Diagnosis Date Noted   Iron  deficiency anemia 01/18/2023   Diarrhea 11/02/2022   Renal cell adenocarcinoma (HCC) 11/25/2021   Renal cell cancer, left (HCC) 10/01/2021   Vitamin D deficiency 08/23/2019   Loss of weight 07/17/2019   PAD (peripheral artery disease) (HCC) 07/04/2019   IBS (irritable bowel syndrome) 01/16/2019   Uncontrolled type 2 diabetes mellitus with hyperglycemia, without long-term current use of insulin (HCC) 01/08/2019   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) 12/21/2018   Lumbar back pain 12/21/2018   Mixed hyperlipidemia 12/21/2018   CAD S/P percutaneous coronary angioplasty 08/03/2016   Claudication (HCC) 08/02/2016   Stable angina (HCC) 08/02/2016   Heavy smoker (more than 20 cigarettes per day)    Headache 12/10/2014   Carotid artery disease (HCC) 12/01/2014   Tobacco use disorder 11/28/2014   HYPERCHOLESTEROLEMIA  IIA 09/13/2008   Depression with anxiety 09/13/2008   Essential hypertension, benign 09/13/2008   GERD (gastroesophageal reflux disease) 09/13/2008   Home Medication(s) Prior to Admission medications   Medication Sig Start Date End Date Taking? Authorizing Provider  acetaminophen (TYLENOL) 325 MG tablet Take 2 tablets (650 mg total) by mouth every 4 (four) hours as needed for headache or mild pain. 08/03/16   Abelino Derrick, PA-C  albuterol (VENTOLIN HFA) 108 (90 Base) MCG/ACT inhaler  Inhale 1-2 puffs into the lungs every 4 (four) hours as needed for shortness of breath or wheezing.    [provider]  ALPRAZolam Prudy Feeler) 0.5 MG tablet Take 0.25 mg by mouth 2 (two) times daily. 03/20/19   [provider]  atorvastatin (LIPITOR) 40 MG tablet Take 40 mg by mouth daily.    [provider]  Budeson-Glycopyrrol-Formoterol (BREZTRI AEROSPHERE) 160-9-4.8 MCG/ACT AERO Inhale into the lungs. 1-2 times per day    [provider]  calcitRIOL (ROCALTROL) 0.25 MCG capsule Take 0.25 mcg by mouth every Monday,  Wednesday, and Friday. 06/14/22 06/14/23  [provider]  calcium acetate (PHOSLO) 667 MG tablet Take 667 mg by mouth daily. 06/14/22 06/14/23  [provider]  Cholecalciferol (VITAMIN D3) 125 MCG (5000 UT) CAPS Take 5,000 Units by mouth once a week. Tuesday    [provider]  clopidogrel (PLAVIX) 75 MG tablet Take 1 tablet (75 mg total) by mouth daily. 12/03/14   Rai, Ripudeep K, MD  escitalopram (LEXAPRO) 20 MG tablet Take 20 mg by mouth daily. 01/01/21   [provider]  gabapentin (NEURONTIN) 300 MG capsule Take 300 mg by mouth every other day. At night    [provider]  glucose blood (ACCU-CHEK GUIDE) test strip Use as instructed 08/23/19   Roma Kayser, MD  guaiFENesin-dextromethorphan (ROBITUSSIN DM) 100-10 MG/5ML syrup Take 10 mLs by mouth every 8 (eight) hours. 05/22/23   Shahmehdi, Gemma Payor, MD  insulin aspart (NOVOLOG FLEXPEN) 100 UNIT/ML FlexPen Inject 5 Units into the skin 3 (three) times daily with meals. 05/12/23   Dani Gobble, NP  insulin degludec (TRESIBA FLEXTOUCH) 100 UNIT/ML FlexTouch Pen Inject 10 Units into the skin at bedtime. 05/10/23   Dani Gobble, NP  Insulin Pen Needle (PEN NEEDLES) 31G X 6 MM MISC Use to inject insulin once daily 05/10/23   Dani Gobble, NP  LOKELMA 10 g PACK packet Take 10 g by mouth See admin instructions. Five times a week 07/14/22   [provider]  nystatin cream (MYCOSTATIN) Apply 1 application  topically 2 (two) times daily as needed (Itching). 07/10/16   [provider]  ondansetron (ZOFRAN) 4 MG tablet Take 1 tablet (4 mg total) by mouth every 6 (six) hours as needed for nausea or vomiting. 05/25/23   Carollee Herter, DO  oxyCODONE-acetaminophen (PERCOCET/ROXICET) 5-325 MG tablet Take 1 tablet by mouth every 6 (six) hours as needed for severe pain or moderate pain. 08/05/22   Karie Soda, MD  pantoprazole (PROTONIX) 40 MG tablet Take 1 tablet (40 mg total) by mouth daily.  11/02/22   Carlan, Chelsea L, NP  sodium bicarbonate 650 MG tablet Take 650 mg by mouth 2 (two) times daily. 06/14/22   [provider]  Past Surgical History Past Surgical History:  Procedure Laterality Date   ABDOMINAL AORTOGRAM N/A 08/02/2016   Procedure: Abdominal Aortogram;  Surgeon: Yvonne Kendall, MD;  Location: [facility];  Service: Cardiovascular;  Laterality: N/A;   CAROTID STENT     CHOLECYSTECTOMY OPEN  1978   COLONOSCOPY  2010   single diverticulum in sigmoid colon   CORONARY ANGIOPLASTY WITH STENT PLACEMENT  08/02/2016   to RCA   CORONARY STENT INTERVENTION N/A 08/02/2016   Procedure: Coronary Stent Intervention;  Surgeon: Yvonne Kendall, MD;  Location: [facility];  Service: Cardiovascular;  Laterality: N/A;  RCA   CYSTOSCOPY W/ RETROGRADES Bilateral 07/15/2021   Procedure: CYSTOSCOPY WITH RETROGRADE PYELOGRAM;  Surgeon: Milderd Meager., MD;  Location: [facility];  Service: Urology;  Laterality: Bilateral;   CYSTOSCOPY WITH STENT PLACEMENT Left 07/15/2021   Procedure: CYSTOSCOPY WITH STENT PLACEMENT;  Surgeon: Milderd Meager., MD;  Location: [facility] OR[facility];  Service: Urology;  Laterality: Left;   ENDARTERECTOMY Left 12/02/2014   Procedure: ENDARTERECTOMY CAROTID;  Surgeon: Larina Earthly, MD;  [facility];  Service: Vascular;  Laterality: Left;   HERNIA REPAIR     LAPAROSCOPIC NEPHRECTOMY Left    LAPAROSCOPIC NEPHRECTOMY Left 11/25/2021   Procedure: LEFT LAPAROSCOPIC RADICAL NEPHRECTOMY;  Surgeon: Crist Fat, MD;  Location: [facility];  Service: Urology;  Laterality: Left;   LEFT HEART CATH AND CORONARY ANGIOGRAPHY N/A 08/02/2016   Procedure: Left Heart Cath and Coronary Angiography;  Surgeon: Yvonne Kendall, MD;  Location: [facility];  Service: Cardiovascular;  Laterality: N/A;   LOWER EXTREMITY  ANGIOGRAM  08/02/2016   LOWER EXTREMITY ANGIOGRAPHY N/A 08/02/2016   Procedure: Lower Extremity Angiography;  Surgeon: Yvonne Kendall, MD;  Location: [facility];  Service: Cardiovascular;  Laterality: N/A;   NEPHRECTOMY Left    VAGINAL HYSTERECTOMY  1991   "partial"   VENTRAL HERNIA REPAIR N/A 08/05/2022   Procedure: LAPAROSCOPIC VENTRAL WALL HERNIA REPAIR WITH MESH;  Surgeon: Karie Soda, MD;  Location: [facility];  Service: General;  Laterality: N/A;   Family History Family History  Problem Relation Age of Onset   Congestive Heart Failure Mother    COPD Father    Cancer Brother    Cancer Brother    Breast cancer Neg Hx     Social History Social History   Tobacco Use   Smoking status: Every Day    Current packs/day: 1.50    Average packs/day: 1.5 packs/day for 49.0 years (73.6 ttl pk-yrs)    Types: Cigarettes    Start date: 05/17/1974   Smokeless tobacco: Never  Vaping Use   Vaping status: Never Used  Substance Use Topics   Alcohol use: No    Alcohol/week: 0.0 standard drinks of alcohol   Drug use: No   Allergies Codeine, Sulfa antibiotics, and Latex  Review of Systems Review of Systems  Cardiovascular:  Positive for leg swelling.    Physical Exam Vital Signs  I have reviewed the triage vital signs BP (!) 174/73 (BP Location: Right Arm)   Pulse 85   Temp 98.2 F (36.8 C) (Oral)   Resp 18   Wt 62.1 kg   SpO2 100%   BMI 22.80 kg/m   Physical Exam Vitals and nursing note reviewed.  Constitutional:      General: She is not in acute distress.    Appearance: She is well-developed.  HENT:     Head: Normocephalic and atraumatic.  Eyes:  Conjunctiva/sclera: Conjunctivae normal.  Cardiovascular:     Rate and Rhythm: Normal rate and regular rhythm.     Heart sounds: No murmur heard. Pulmonary:     Effort: Pulmonary effort is normal. No respiratory distress.     Breath sounds: Normal breath sounds.  Abdominal:     Palpations: Abdomen is soft.      Tenderness: There is no abdominal tenderness.  Musculoskeletal:        General: No swelling.     Cervical back: Neck supple.     Left lower leg: Edema present.  Skin:    General: Skin is warm and dry.     Capillary Refill: Capillary refill takes less than 2 seconds.  Neurological:     Mental Status: She is alert.  Psychiatric:        Mood and Affect: Mood normal.     ED Results and Treatments Labs (all labs ordered are listed, but only abnormal results are displayed) Labs Reviewed  COMPREHENSIVE METABOLIC PANEL - Abnormal; Notable for the following components:      Result Value   Glucose, Bld 188 (*)    BUN 38 (*)    Creatinine, Ser 2.49 (*)    Calcium 7.8 (*)    Total Protein 5.9 (*)    Albumin 2.7 (*)    GFR, Estimated 21 (*)    All other components within normal limits  CBC WITH DIFFERENTIAL/PLATELET - Abnormal; Notable for the following components:   RBC 3.24 (*)    Hemoglobin 9.7 (*)    HCT 30.9 (*)    All other components within normal limits  BRAIN NATRIURETIC PEPTIDE - Abnormal; Notable for the following components:   B Natriuretic Peptide 566.0 (*)    All other components within normal limits  D-DIMER, QUANTITATIVE - Abnormal; Notable for the following components:   D-Dimer, Quant 1.02 (*)    All other components within normal limits                                                                                                                          Radiology No results found.  Pertinent labs & imaging results that were available during my care of the patient were reviewed by me and considered in my medical decision making (see MDM for details).  Medications Ordered in ED Medications - No data to display  Procedures Procedures  (including critical care time)  Medical Decision Making / ED Course   This patient presents  to the ED for concern of leg swelling, this involves an extensive number of treatment options, and is a complaint that carries with it a high risk of complications and morbidity.  The differential diagnosis includes DVT, lymphedema, gravity dependent edema, cellulitis  MDM: Patient seen emergency room for evaluation of leg swelling.  Physical exam with bilateral lower extremity edema left greater than right.  Laboratory evaluation with a hemoglobin of 9.7, BNP 566, BUN 38, creatinine 2.49 which is patient's baseline, albumin 2.7.  We do not have ultrasound coverage over the weekend and thus we attempted to use a D-dimer to rule out DVT.  Unfortunately patient's D-dimer was slightly elevated at 1.02.  Thus we will cover with Lovenox until she can return on Monday morning for an ultrasound.  Spoke with pharmacy who believes that given her current renal function this likely will cover her until Monday morning.  Patient encouraged to use her compression stockings until then and return if she has any chest pain, shortness of breath, or any other concerning symptoms.  Patient voiced understanding of the return precautions and was discharged with outpatient follow-up.   Additional history obtained: -Additional history obtained from husband -External records from outside source obtained and reviewed including: Chart review including previous notes, labs, imaging, consultation notes   Lab Tests: -I ordered, reviewed, and interpreted labs.   The pertinent results include:   Labs Reviewed  COMPREHENSIVE METABOLIC PANEL - Abnormal; Notable for the following components:      Result Value   Glucose, Bld 188 (*)    BUN 38 (*)    Creatinine, Ser 2.49 (*)    Calcium 7.8 (*)    Total Protein 5.9 (*)    Albumin 2.7 (*)    GFR, Estimated 21 (*)    All other components within normal limits  CBC WITH DIFFERENTIAL/PLATELET - Abnormal; Notable for the following components:   RBC 3.24 (*)    Hemoglobin 9.7 (*)     HCT 30.9 (*)    All other components within normal limits  BRAIN NATRIURETIC PEPTIDE - Abnormal; Notable for the following components:   B Natriuretic Peptide 566.0 (*)    All other components within normal limits  D-DIMER, QUANTITATIVE - Abnormal; Notable for the following components:   D-Dimer, Quant 1.02 (*)    All other components within normal limits    Medicines ordered and prescription drug management: No orders of the defined types were placed in this encounter.   -I have reviewed the patients home medicines and have made adjustments as needed  Critical interventions none   Cardiac Monitoring: The patient was maintained on a cardiac monitor.  I personally viewed and interpreted the cardiac monitored which showed an underlying rhythm of: NSR  Social Determinants of Health:  Factors impacting patients care include: none   Reevaluation: After the interventions noted above, I reevaluated the patient and found that they have :stayed the same  Co morbidities that complicate the patient evaluation  Past Medical History:  Diagnosis Date   Anxiety    CAD (coronary artery disease)    a. s/p DES to RCA in 07/2016 with residual mid-LAD stenosis   Cancer (HCC)    skin cancer on finger,, cancer left kidney   Cervicalgia    Chest pain, unspecified    Chronic kidney disease    Depression    GERD (gastroesophageal reflux  disease)    Headache    "bad ones after my stroke in 2016; only have them when I get bad news now" (08/02/2016)   Heart murmur    Heavy smoker (more than 20 cigarettes per day)    Scheduled for LDCT screening 02/11/15   History of hiatal hernia    History of kidney stones    "no cysto/OR" (12/10/2014)   Hypercholesteremia    Hypertension    Incarcerated incisional hernia 08/05/2022   Left renal mass    Obesity    Palpitations    Reflux esophagitis    Stroke [facility])    "they said I had a  MINI stroke a few months back/MRI" (12/10/2014)   TIA (transient  ischemic attack) 11/28/2014   [name] 11/28/2014   Type II diabetes mellitus (HCC)       Dispostion: I considered admission for this patient, but at this time she does not meet inpatient criteria for admission and will be discharged with outpatient follow-up     Final Clinical Impression(s) / ED Diagnoses Final diagnoses:  None     @PCDICTATION @    Glendora Score, MD 06/04/23 2024

## 2023-06-05 ENCOUNTER — Ambulatory Visit (HOSPITAL_COMMUNITY): Admission: RE | Admit: 2023-06-05 | Source: Ambulatory Visit

## 2023-06-06 ENCOUNTER — Ambulatory Visit (HOSPITAL_COMMUNITY)
Admission: RE | Admit: 2023-06-06 | Discharge: 2023-06-06 | Disposition: A | Discharge status: Home / Self Care | Source: Ambulatory Visit | Attending: Student | Admitting: Student

## 2023-06-06 ENCOUNTER — Encounter: Payer: Self-pay | Admitting: Nurse Practitioner

## 2023-06-06 ENCOUNTER — Ambulatory Visit (INDEPENDENT_AMBULATORY_CARE_PROVIDER_SITE_OTHER): Admitting: Nurse Practitioner

## 2023-06-06 VITALS — BP 126/68 | HR 72 | Ht 65.0 in | Wt 138.4 lb

## 2023-06-06 DIAGNOSIS — Z794 Long term (current) use of insulin: Secondary | ICD-10-CM | POA: Diagnosis not present

## 2023-06-06 DIAGNOSIS — I10 Essential (primary) hypertension: Secondary | ICD-10-CM | POA: Diagnosis not present

## 2023-06-06 DIAGNOSIS — E559 Vitamin D deficiency, unspecified: Secondary | ICD-10-CM | POA: Diagnosis not present

## 2023-06-06 DIAGNOSIS — M7989 Other specified soft tissue disorders: Secondary | ICD-10-CM | POA: Insufficient documentation

## 2023-06-06 DIAGNOSIS — E782 Mixed hyperlipidemia: Secondary | ICD-10-CM

## 2023-06-06 DIAGNOSIS — E1159 Type 2 diabetes mellitus with other circulatory complications: Secondary | ICD-10-CM | POA: Diagnosis not present

## 2023-06-06 DIAGNOSIS — R600 Localized edema: Secondary | ICD-10-CM | POA: Diagnosis not present

## 2023-06-06 NOTE — Progress Notes (Signed)
 06/06/2023, 4:01 PM                                Endocrinology follow-up note   Subjective:    Patient ID: Grace Hess, female    DOB: [birth year].  Grace Hess is being seen in follow-up for management of currently uncontrolled symptomatic diabetes requested by  Grace Nevins, MD.   Past Medical History:  Diagnosis Date   Anxiety    CAD (coronary artery disease)    a. s/p DES to RCA in 07/2016 with residual mid-LAD stenosis   Cancer (HCC)    skin cancer on finger,, cancer left kidney   Cervicalgia    Chest pain, unspecified    Chronic kidney disease    Depression    GERD (gastroesophageal reflux disease)    Headache    "bad ones after my stroke in 2016; only have them when I get bad news now" (08/02/2016)   Heart murmur    Heavy smoker (more than 20 cigarettes per day)    Scheduled for LDCT screening 02/11/15   History of hiatal hernia    History of kidney stones    "no cysto/OR" (12/10/2014)   Hypercholesteremia    Hypertension    Incarcerated incisional hernia 08/05/2022   Left renal mass    Obesity    Palpitations    Reflux esophagitis    Stroke [facility])    "they said I had a  MINI stroke a few months back/MRI" (12/10/2014)   TIA (transient ischemic attack) 11/28/2014   Grace Hess 11/28/2014   Type II diabetes mellitus (HCC)     Past Surgical History:  Procedure Laterality Date   ABDOMINAL AORTOGRAM N/A 08/02/2016   Procedure: Abdominal Aortogram;  Surgeon: Yvonne Kendall, MD;  Location: [facility];  Service: Cardiovascular;  Laterality: N/A;   CAROTID STENT     CHOLECYSTECTOMY OPEN  1978   COLONOSCOPY  2010   single diverticulum in sigmoid colon   CORONARY ANGIOPLASTY WITH STENT PLACEMENT  08/02/2016   to RCA   CORONARY STENT INTERVENTION N/A 08/02/2016   Procedure: Coronary Stent Intervention;  Surgeon: Yvonne Kendall, MD;  Location: [facility];   Service: Cardiovascular;  Laterality: N/A;  RCA   CYSTOSCOPY W/ RETROGRADES Bilateral 07/15/2021   Procedure: CYSTOSCOPY WITH RETROGRADE PYELOGRAM;  Surgeon: Milderd Meager., MD;  Location: [facility];  Service: Urology;  Laterality: Bilateral;   CYSTOSCOPY WITH STENT PLACEMENT Left 07/15/2021   Procedure: CYSTOSCOPY WITH STENT PLACEMENT;  Surgeon: Milderd Meager., MD;  Location: [facility];  Service: Urology;  Laterality: Left;   ENDARTERECTOMY Left 12/02/2014   Procedure: ENDARTERECTOMY CAROTID;  Surgeon: Larina Earthly, MD;  Location: [facility] OR;  Service: Vascular;  Laterality: Left;   HERNIA REPAIR     LAPAROSCOPIC NEPHRECTOMY Left    LAPAROSCOPIC NEPHRECTOMY Left 11/25/2021   Procedure: LEFT LAPAROSCOPIC RADICAL NEPHRECTOMY;  Surgeon: Crist Fat, MD;  Location: [facility];  Service: Urology;  Laterality: Left;   LEFT HEART CATH AND CORONARY ANGIOGRAPHY N/A 08/02/2016  Procedure: Left Heart Cath and Coronary Angiography;  Surgeon: Yvonne Kendall, MD;  Location: [facility];  Service: Cardiovascular;  Laterality: N/A;   LOWER EXTREMITY ANGIOGRAM  08/02/2016   LOWER EXTREMITY ANGIOGRAPHY N/A 08/02/2016   Procedure: Lower Extremity Angiography;  Surgeon: Yvonne Kendall, MD;  Location: [facility];  Service: Cardiovascular;  Laterality: N/A;   NEPHRECTOMY Left    VAGINAL HYSTERECTOMY  1991   "partial"   VENTRAL HERNIA REPAIR N/A 08/05/2022   Procedure: LAPAROSCOPIC VENTRAL WALL HERNIA REPAIR WITH MESH;  Surgeon: Karie Soda, MD;  Location: [facility];  Service: General;  Laterality: N/A;    Social History   Socioeconomic History   Marital status: Married    Spouse name: Not on file   Number of children: 2   Years of education: 12   Highest education level: Not on file  Occupational History   Not on file  Tobacco Use   Smoking status: Every Day    Current packs/day: 1.50    Average packs/day: 1.5 packs/day for 49.1 years (73.6 ttl pk-yrs)    Types: Cigarettes     Start date: 05/17/1974   Smokeless tobacco: Never  Vaping Use   Vaping status: Never Used  Substance and Sexual Activity   Alcohol use: No    Alcohol/week: 0.0 standard drinks of alcohol   Drug use: No   Sexual activity: Not Currently  Other Topics Concern   Not on file  Social History Narrative   Patient does not drink caffeine.   Patient is right handed.    Social Drivers of Corporate investment banker Strain: Low Risk  (05/18/2022)   [facility] from [facility], [facility]   Overall Financial Resource Strain (CARDIA)    Difficulty of Paying Living Expenses: Not hard at all  Food Insecurity: No Food Insecurity (05/24/2023)   Hunger Vital Sign    Worried About Running Out of Food in the Last Year: Never true    Ran Out of Food in the Last Year: Never true  Transportation Needs: No Transportation Needs (05/24/2023)   PRAPARE - Administrator, Civil Service (Medical): No    Lack of Transportation (Non-Medical): No  Physical Activity: Not on file  Stress: Not on file  Social Connections: Moderately Isolated (05/20/2023)   Social Connection and Isolation Panel [NHANES]    Frequency of Communication with Friends and Family: Once a week    Frequency of Social Gatherings with Friends and Family: Once a week    Attends Religious Services: 1 to 4 times per year    Active Member of [employer] or Organizations: No    Attends Engineer, structural: Never    Marital Status: Married    Family History  Problem Relation Age of Onset   Congestive Heart Failure Mother    COPD Father    Cancer Brother    Cancer Brother    Breast cancer Neg Hx     Outpatient Encounter Medications as of 06/06/2023  Medication Sig   acetaminophen (TYLENOL) 325 MG tablet Take 2 tablets (650 mg total) by mouth every 4 (four) hours as needed for headache or mild pain.   albuterol (VENTOLIN HFA) 108 (90 Base) MCG/ACT inhaler Inhale 1-2 puffs into the lungs every 4 (four) hours as needed for  shortness of breath or wheezing.   ALPRAZolam (XANAX) 0.5 MG tablet Take 0.25 mg by mouth 2 (two) times daily.   Budeson-Glycopyrrol-Formoterol (BREZTRI AEROSPHERE) 160-9-4.8 MCG/ACT AERO Inhale into  the lungs. 1-2 times per day   calcitRIOL (ROCALTROL) 0.25 MCG capsule Take 0.25 mcg by mouth every Monday, Wednesday, and Friday.   calcium acetate (PHOSLO) 667 MG tablet Take 667 mg by mouth daily.   Cholecalciferol (VITAMIN D3) 125 MCG (5000 UT) CAPS Take 5,000 Units by mouth once a week. Tuesday   clopidogrel (PLAVIX) 75 MG tablet Take 1 tablet (75 mg total) by mouth daily.   escitalopram (LEXAPRO) 20 MG tablet Take 20 mg by mouth daily.   gabapentin (NEURONTIN) 300 MG capsule Take 300 mg by mouth every other day. At night   glucose blood (ACCU-CHEK GUIDE) test strip Use as instructed   Insulin Pen Needle (PEN NEEDLES) 31G X 6 MM MISC Use to inject insulin once daily   LOKELMA 10 g PACK packet Take 10 g by mouth See admin instructions. Five times a week   nystatin cream (MYCOSTATIN) Apply 1 application  topically 2 (two) times daily as needed (Itching).   ondansetron (ZOFRAN) 4 MG tablet Take 1 tablet (4 mg total) by mouth every 6 (six) hours as needed for nausea or vomiting.   oxyCODONE-acetaminophen (PERCOCET/ROXICET) 5-325 MG tablet Take 1 tablet by mouth every 6 (six) hours as needed for severe pain or moderate pain.   pantoprazole (PROTONIX) 40 MG tablet Take 1 tablet (40 mg total) by mouth daily.   sodium bicarbonate 650 MG tablet Take 650 mg by mouth 2 (two) times daily.   [DISCONTINUED] insulin aspart (NOVOLOG FLEXPEN) 100 UNIT/ML FlexPen Inject 5 Units into the skin 3 (three) times daily with meals.   atorvastatin (LIPITOR) 40 MG tablet Take 40 mg by mouth daily. (Patient not taking: Reported on 06/06/2023)   guaiFENesin-dextromethorphan (ROBITUSSIN DM) 100-10 MG/5ML syrup Take 10 mLs by mouth every 8 (eight) hours. (Patient not taking: Reported on 06/06/2023)   insulin degludec (TRESIBA  FLEXTOUCH) 100 UNIT/ML FlexTouch Pen Inject 10 Units into the skin at bedtime.   No facility-administered encounter medications on file as of 06/06/2023.    ALLERGIES: Allergies  Allergen Reactions   Codeine Shortness Of Breath   Sulfa Antibiotics Other (See Comments)    Unknown    Latex Itching    VACCINATION STATUS: Immunization History  Administered Date(s) Administered   Influenza,inj,Quad PF,6+ Mos 11/29/2014   Pneumococcal Polysaccharide-23 11/29/2014    Diabetes She presents for her follow-up diabetic visit. She has type 2 diabetes mellitus. Onset time: She was diagnosed at approximate age of 50 years. Her disease course has been improving. There are no hypoglycemic associated symptoms. Pertinent negatives for hypoglycemia include no confusion, headaches, pallor or seizures. Associated symptoms include fatigue and foot paresthesias. Pertinent negatives for diabetes include no chest pain, no polydipsia, no polyphagia, no polyuria and no weight loss. There are no hypoglycemic complications. Symptoms are stable. Diabetic complications include a CVA, heart disease and nephropathy. Risk factors for coronary artery disease include dyslipidemia, diabetes mellitus, family history, hypertension, tobacco exposure, sedentary lifestyle and post-menopausal. Current diabetic treatment includes insulin injections. She is compliant with treatment most of the time. Her weight is fluctuating minimally. She is following a generally healthy diet. When asked about meal planning, she reported none. She has not had a previous visit with a dietitian. She never participates in exercise. Her home blood glucose trend is decreasing steadily. Her overall blood glucose range is 110-130 mg/dl. (She presents today with her meter and logs showing drastically improved glycemic profile.  She was not due for another A1c today.  She was recently treated for  sepsis secondary to flu and pneumonia.  She had severe hyperglycemia  at that time and she was placed on prandial insulin to help combat higher readings.  Analysis of her meter shows 7-day average of 121 (11 readings); 14-day average of 123 (12 readings); 30-day average of 266 (30 readings).  She does not take the Novolog recently due to improved readings and some hypoglycemia in the 60s.  She prefers fingersticks over CGM.) An ACE inhibitor/angiotensin II receptor blocker is being taken. She does not see a podiatrist.Eye exam is not current.  Hyperlipidemia This is a chronic problem. The current episode started more than 1 year ago. The problem is controlled. Recent lipid tests were reviewed and are normal. Exacerbating diseases include chronic renal disease and diabetes. Factors aggravating her hyperlipidemia include beta blockers, fatty foods and smoking. Pertinent negatives include no chest pain, myalgias or shortness of breath. Current antihyperlipidemic treatment includes statins. The current treatment provides mild improvement of lipids. Compliance problems include adherence to diet and adherence to exercise.  Risk factors for coronary artery disease include diabetes mellitus, dyslipidemia, hypertension, a sedentary lifestyle, post-menopausal and family history.  Hypertension This is a chronic problem. The current episode started more than 1 year ago. The problem has been resolved since onset. The problem is controlled. Pertinent negatives include no chest pain, headaches, palpitations or shortness of breath. There are no associated agents to hypertension. Risk factors for coronary artery disease include dyslipidemia, diabetes mellitus, sedentary lifestyle, smoking/tobacco exposure and post-menopausal state. Past treatments include ACE inhibitors and beta blockers. The current treatment provides moderate improvement. Compliance problems include exercise and diet.  Hypertensive end-organ damage includes kidney disease, CAD/MI and CVA. Identifiable causes of hypertension  include chronic renal disease.    Review of systems  Constitutional: + stable body weight,  current Body mass index is 23.03 kg/m. , + fatigue-improving, no subjective hyperthermia, no subjective hypothermia Eyes: no blurry vision, no xerophthalmia ENT: no sore throat, no nodules palpated in throat, no dysphagia/odynophagia, no hoarseness Cardiovascular: no chest pain, no shortness of breath, no palpitations, no leg swelling Respiratory: no cough, no shortness of breath Gastrointestinal: no nausea/vomiting/diarrhea Musculoskeletal: no muscle/joint aches Skin: no rashes, no hyperemia Neurological: no tremors, no dizziness, + numbness/tingling to BLE (feet) Psychiatric: no depression, no anxiety  Objective:    BP 126/68 (BP Location: Right Arm, Patient Position: Sitting, Cuff Size: Large)   Ht 5\' 5"  (1.651 m)   Wt 138 lb 6.4 oz (62.8 kg)   BMI 23.03 kg/m   Wt Readings from Last 3 Encounters:  06/06/23 138 lb 6.4 oz (62.8 kg)  06/04/23 137 lb (62.1 kg)  05/31/23 137 lb (62.1 kg)    BP Readings from Last 3 Encounters:  06/06/23 126/68  06/04/23 (!) 174/73  05/31/23 (!) 169/77      Physical Exam- Limited  Constitutional:  Body mass index is 23.03 kg/m. , not in acute distress, normal state of mind Eyes:  EOMI, no exophthalmos Musculoskeletal: no gross deformities, strength intact in all four extremities, no gross restriction of joint movements Skin:  no rashes, no hyperemia Neurological: no tremor with outstretched hands  Diabetic Foot Exam - Simple   No data filed    CMP     Component Value Date/Time   NA 139 06/04/2023 1757   NA 141 09/28/2021 0822   K 5.1 06/04/2023 1757   CL 103 06/04/2023 1757   CO2 24 06/04/2023 1757   GLUCOSE 188 (H) 06/04/2023 1757   BUN 38 (H)  06/04/2023 1757   BUN 30 (H) 09/28/2021 0822   CREATININE 2.49 (H) 06/04/2023 1757   CREATININE 1.38 (H) 08/20/2019 0719   CALCIUM 7.8 (L) 06/04/2023 1757   PROT 5.9 (L) 06/04/2023 1757    PROT 6.6 09/28/2021 0822   ALBUMIN 2.7 (L) 06/04/2023 1757   ALBUMIN 4.2 09/28/2021 0822   AST 15 06/04/2023 1757   ALT 17 06/04/2023 1757   ALKPHOS 76 06/04/2023 1757   BILITOT 0.4 06/04/2023 1757   BILITOT 0.3 09/28/2021 0822   GFRNONAA 21 (L) 06/04/2023 1757   GFRNONAA 40 (L) 08/20/2019 0719   GFRAA 51 (L) 03/26/2020 0927   GFRAA 47 (L) 08/20/2019 0719     Diabetic Labs (most recent): Lab Results  Component Value Date   HGBA1C 14.6 (A) 05/10/2023   HGBA1C 7.7 (A) 01/28/2023   HGBA1C 9.1 (A) 10/26/2022   MICROALBUR 26.7 08/20/2019    Lipid Panel     Component Value Date/Time   CHOL 168 05/25/2023 0413   CHOL 161 03/30/2021 0803   TRIG 270 (H) 05/25/2023 0413   HDL 38 (L) 05/25/2023 0413   HDL 35 (L) 03/30/2021 0803   CHOLHDL 4.4 05/25/2023 0413   VLDL 54 (H) 05/25/2023 0413   LDLCALC 76 05/25/2023 0413   LDLCALC 90 03/30/2021 0803   LDLCALC 69 01/16/2019 0937   LABVLDL 36 03/30/2021 0803       Assessment & Plan:   1) DM type 2 causing vascular disease (HCC)  - Grace Hess has currently uncontrolled symptomatic type 2 DM since  68 years of age.  She presents today with her meter and logs showing drastically improved glycemic profile.  She was not due for another A1c today.  She was recently treated for sepsis secondary to flu and pneumonia.  She had severe hyperglycemia at that time and she was placed on prandial insulin to help combat higher readings.  Analysis of her meter shows 7-day average of 121 (11 readings); 14-day average of 123 (12 readings); 30-day average of 266 (30 readings).  She does not take the Novolog recently due to improved readings and some hypoglycemia in the 60s.  She prefers fingersticks over CGM.  -Recent labs reviewed showing worsening kidney function- will refer to nephrology for further evaluation and treatment.  - I had a long discussion with her about the progressive nature of diabetes and the pathology behind its  complications. -her diabetes is complicated by coronary artery disease, CVA, nephropathy, peripheral neuropathy, chronic heavy smoking, sedentary life and she remains at a high risk for more acute and chronic complications which include CAD, CVA, CKD, retinopathy, and neuropathy. These are all discussed in detail with her.  - Nutritional counseling repeated at each appointment due to patients tendency to fall back in to old habits.  - The patient admits there is a room for improvement in their diet and drink choices. -  Suggestion is made for the patient to avoid simple carbohydrates from their diet including Cakes, Sweet Desserts / Pastries, Ice Cream, Soda (diet and regular), Sweet Tea, Candies, Chips, Cookies, Sweet Pastries, Store Bought Juices, Alcohol in Excess of 1-2 drinks a day, Artificial Sweeteners, Coffee Creamer, and "Sugar-free" Products. This will help patient to have stable blood glucose profile and potentially avoid unintended weight gain.   - I encouraged the patient to switch to unprocessed or minimally processed complex starch and increased protein intake (animal or plant source), fruits, and vegetables.   - Patient is advised to stick to a  routine mealtimes to eat 3 meals a day and avoid unnecessary snacks (to snack only to correct hypoglycemia).  - I have approached her with the following individualized plan to manage  her diabetes and patient agrees:   -She is advised to adjust her Tresiba to 12 units SQ nightly and stop the Novolog for now.  Her glucose has improved tremendously now that her infection has cleared.    She is advised to restart monitoring glucose twice daily, before breakfast and before bed, and to call the clinic if she has readings less than 70 or above 200 for 3 tests in a row. She did not care for the [facility].  She is not a candidate for Metformin due to kidney disease, she does not meet criteria for GLP1 use with BMI less than 25.  She was on Glipizide in  the past but had severe hypoglycemia.  - Specific targets for  A1c;  LDL, HDL, Triglycerides, and  Waist Circumference were discussed with the patient.  2) Blood Pressure /Hypertension:  Her blood pressure is tight, recently taken off all BP meds.    3) Lipids/Hyperlipidemia:   Her most recent lipid panel from 03/30/21 shows controlled LDL of 90 and elevated triglycerides of 212.  She is advised to continue Crestor 20 mg po daily at bedtime.  Side effects and precautions discussed with her.  She is advised to avoid fried foods and butter.    4)  Weight/Diet:  Her Body mass index is 23.03 kg/m..  she is not a candidate for more weight loss.   Exercise, and detailed carbohydrates information provided  -  detailed on discharge instructions.  5) Chronic Care/Health Maintenance: -she is on ACEI/ARB and Statin medications and is encouraged to initiate and continue to follow up with Ophthalmology, Dentist, Podiatrist at least yearly or according to recommendations, and advised to quit smoking. I have recommended yearly flu vaccine and pneumonia vaccine at least every 5 years; moderate intensity exercise for up to 150 minutes weekly; and sleep for at least 7 hours a day.  - she is advised to maintain close follow up with Grace Nevins, MD for primary care needs (will be calling him about her fatigue to have iron checked), as well as her other providers for optimal and coordinated care (also reaching out to surgeon about the mass that has appeared.       I spent  23  minutes in the care of the patient today including review of labs from CMP, Lipids, Thyroid Function, Hematology (current and previous including abstractions from other facilities); face-to-face time discussing  her blood glucose readings/logs, discussing hypoglycemia and hyperglycemia episodes and symptoms, medications doses, her options of short and long term treatment based on the latest standards of care / guidelines;  discussion about  incorporating lifestyle medicine;  and documenting the encounter. Risk reduction counseling performed per USPSTF guidelines to reduce obesity and cardiovascular risk factors.     Please refer to Patient Instructions for Blood Glucose Monitoring and Insulin/Medications Dosing Guide"  in media tab for additional information. Please  also refer to " Patient Self Inventory" in the Media  tab for reviewed elements of pertinent patient history.  Grace Hess participated in the discussions, expressed understanding, and voiced agreement with the above plans.  All questions were answered to her satisfaction. she is encouraged to contact clinic should she have any questions or concerns prior to her return visit.   Follow up plan: - Return in about 3 months (  around 09/06/2023) for Diabetes F/U with A1c in office, No previsit labs, Bring meter and logs.  Ronny Bacon, [facility] [address] [place], [place] [address] Phone: [phone] Fax: [phone]  06/06/2023, 4:01 PM

## 2023-07-18 ENCOUNTER — Other Ambulatory Visit: Payer: Self-pay | Admitting: Hematology

## 2023-07-18 ENCOUNTER — Ambulatory Visit (HOSPITAL_COMMUNITY)
Admission: RE | Admit: 2023-07-18 | Discharge: 2023-07-18 | Disposition: A | Discharge status: Home / Self Care | Payer: PPO | Source: Ambulatory Visit | Attending: Hematology | Admitting: Hematology

## 2023-07-18 ENCOUNTER — Inpatient Hospital Stay: Payer: PPO | Attending: Hematology

## 2023-07-18 DIAGNOSIS — Z905 Acquired absence of kidney: Secondary | ICD-10-CM | POA: Diagnosis not present

## 2023-07-18 DIAGNOSIS — E1122 Type 2 diabetes mellitus with diabetic chronic kidney disease: Secondary | ICD-10-CM | POA: Diagnosis not present

## 2023-07-18 DIAGNOSIS — C642 Malignant neoplasm of left kidney, except renal pelvis: Secondary | ICD-10-CM

## 2023-07-18 DIAGNOSIS — N189 Chronic kidney disease, unspecified: Secondary | ICD-10-CM | POA: Diagnosis not present

## 2023-07-18 DIAGNOSIS — D509 Iron deficiency anemia, unspecified: Secondary | ICD-10-CM

## 2023-07-18 DIAGNOSIS — D631 Anemia in chronic kidney disease: Secondary | ICD-10-CM | POA: Diagnosis not present

## 2023-07-18 DIAGNOSIS — F17210 Nicotine dependence, cigarettes, uncomplicated: Secondary | ICD-10-CM | POA: Insufficient documentation

## 2023-07-18 DIAGNOSIS — Z9049 Acquired absence of other specified parts of digestive tract: Secondary | ICD-10-CM | POA: Diagnosis not present

## 2023-07-18 LAB — COMPREHENSIVE METABOLIC PANEL WITH GFR
ALT: 15 U/L (ref 0–44)
AST: 15 U/L (ref 15–41)
Albumin: 3.7 g/dL (ref 3.5–5.0)
Alkaline Phosphatase: 63 U/L (ref 38–126)
Anion gap: 13 (ref 5–15)
BUN: 51 mg/dL — ABNORMAL HIGH (ref 8–23)
CO2: 19 mmol/L — ABNORMAL LOW (ref 22–32)
Calcium: 9.1 mg/dL (ref 8.9–10.3)
Chloride: 103 mmol/L (ref 98–111)
Creatinine, Ser: 2.93 mg/dL — ABNORMAL HIGH (ref 0.44–1.00)
GFR, Estimated: 17 mL/min — ABNORMAL LOW (ref >60)
Glucose, Bld: 106 mg/dL — ABNORMAL HIGH (ref 70–99)
Potassium: 5.2 mmol/L — ABNORMAL HIGH (ref 3.5–5.1)
Sodium: 135 mmol/L (ref 135–145)
Total Bilirubin: 0.4 mg/dL (ref 0.0–1.2)
Total Protein: 7.1 g/dL (ref 6.5–8.1)

## 2023-07-18 LAB — CBC WITH DIFFERENTIAL/PLATELET
Abs Immature Granulocytes: 0.03 10*3/uL (ref 0.00–0.07)
Basophils Absolute: 0.1 10*3/uL (ref 0.0–0.1)
Basophils Relative: 1 %
Eosinophils Absolute: 0.4 10*3/uL (ref 0.0–0.5)
Eosinophils Relative: 4 %
HCT: 36.3 % (ref 36.0–46.0)
Hemoglobin: 11.5 g/dL — ABNORMAL LOW (ref 12.0–15.0)
Immature Granulocytes: 0 %
Lymphocytes Relative: 22 %
Lymphs Abs: 2 10*3/uL (ref 0.7–4.0)
MCH: 30.3 pg (ref 26.0–34.0)
MCHC: 31.7 g/dL (ref 30.0–36.0)
MCV: 95.5 fL (ref 80.0–100.0)
Monocytes Absolute: 0.8 10*3/uL (ref 0.1–1.0)
Monocytes Relative: 9 %
Neutro Abs: 6 10*3/uL (ref 1.7–7.7)
Neutrophils Relative %: 64 %
Platelets: 259 10*3/uL (ref 150–400)
RBC: 3.8 MIL/uL — ABNORMAL LOW (ref 3.87–5.11)
RDW: 15.7 % — ABNORMAL HIGH (ref 11.5–15.5)
WBC: 9.3 10*3/uL (ref 4.0–10.5)
nRBC: 0 % (ref 0.0–0.2)

## 2023-07-18 LAB — IRON AND TIBC
Iron: 89 ug/dL (ref 28–170)
Saturation Ratios: 32 % — ABNORMAL HIGH (ref 10.4–31.8)
TIBC: 281 ug/dL (ref 250–450)
UIBC: 192 ug/dL

## 2023-07-18 LAB — FERRITIN: Ferritin: 156 ng/mL (ref 11–307)

## 2023-07-25 ENCOUNTER — Inpatient Hospital Stay: Payer: PPO | Admitting: Hematology

## 2023-07-25 NOTE — Progress Notes (Incomplete)
 [facility], [place] [address] [place] [address]    Clinic Day:  07/25/2023  Referring physician: Kathyleen Parkins, MD  Patient Care Team: Kathyleen Parkins, MD as PCP - General (Internal Medicine) Amanda Jungling Joyceann No, MD as PCP - Cardiology (Cardiology) Paulett Boros, MD as Medical Oncologist (Medical Oncology) Andrez Banker, MD as Attending Physician (Urology) Wendel Hals, NP as Nurse Practitioner (Endocrinology) Candyce Champagne, MD as Consulting Physician (General Surgery)   ASSESSMENT & PLAN:   Assessment: 1. 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  Moderate to marked severity left-sided hydronephrosis.  Numerous bilateral nonobstructing renal calculi. - MRI abdomen (08/14/2021): Central lower pole left kidney 4 x 3.7 x 5.3 cm enhancing mass, extension into the proximal left ureter.  No renal vein involvement or evidence of abdominal metastatic disease.  Multiple small abdominal retroperitoneal lymph nodes, nonpathologic by size criteria.  Right abdominal wall subcutaneous nodule nonspecific but new since 07/13/2021. - Left kidney biopsy (09/10/2021): Clear-cell renal cell carcinoma, nuclear grade 2.  No tumor necrosis. - Left nephrectomy on 11/25/2021: Pathology 3.2 cm clear-cell renal cell carcinoma, grade 4, negative sarcoid features, focal rhabdoid features, tumor necrosis negative.  PT1APN0.   2. Social/family history: - She lives at home with her husband.  She retired after working in [employer].  She is current active smoker, 2 packs/day for 48 years. - Brother had throat cancer and was smoker.  Maternal uncle died of cancer.  Paternal uncle also had cancer.  Maternal grandmother had lung cancer.    Plan: 1. Stage I (T1 aN0 M0)  left kidney clear-cell renal cell carcinoma, grade 4: - She denies any hematuria or new onset pains. - Reviewed labs from 01/10/2023: Normal LFTs.  Creatinine 3.20.  CBC was normal. - CT CAP without contrast on 01/10/2023: No signs of local recurrence or metastatic disease.  Stable small scattered lung nodules since 2016.  Nonobstructing right kidney stone. - RTC 6 months with repeat CTAP without IV contrast and labs.   2.  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 - She is taking iron tablet daily and is reasonably tolerating it well. - Her ferritin level decreased to 59 from 121.  Hemoglobin is stable at 10.7. - I have recommended parenteral iron therapy with Monoferric  1 g x 1.  We discussed side effects including rare chance of anaphylactic reaction. - She will decrease oral iron to 1 tablet Monday/Wednesday/Friday. - Will plan on repeating ferritin and iron panel at next visit in 6 months.      No orders of the defined types were placed in this encounter.     Nadeen Augusta Teague,acting as a Neurosurgeon for Paulett Boros, MD.,have documented all relevant documentation on the behalf of Paulett Boros, MD,as directed by  Paulett Boros, MD while in the presence of Paulett Boros, MD.  ***     [place] R Teague   5/12/20259:23 AM  CHIEF COMPLAINT:   Diagnosis: left kidney clear cell renal cell carcinoma    Cancer Staging  Renal cell cancer, left [facility]) Staging form: Kidney, AJCC 8th Edition - Clinical stage from 10/01/2021: Stage I (cT1a, cN0, cM0) - Signed by Paulett Boros, MD on 12/22/2021    Prior Therapy: Left radical nephrectomy on 11/25/2021  Current Therapy:  Surveillance    HISTORY OF PRESENT ILLNESS:   Oncology History  Renal cell cancer, left (HCC)  10/01/2021 Initial Diagnosis   Renal cell cancer, left (HCC)   10/01/2021 Cancer Staging   Staging form: Kidney, AJCC 8th Edition - Clinical stage from  10/01/2021: Stage I (cT1a, cN0, cM0) - Signed by Paulett Boros, MD on 12/22/2021 Histopathologic type: Clear cell adenocarcinoma, NOS Stage prefix: Initial diagnosis Histologic grade (G): G4 Histologic grading system: 4 grade system Histologic sub-type: Clear cell Sarcomatoid features: Absent Rhabdoid features: Present Tumor necrosis: Absent      INTERVAL HISTORY:   Grace Hess is a 68 y.o. female presenting to clinic today for follow up of left kidney clear cell renal cell carcinoma. She was last seen by me on 01/18/23.  Since her last visit, she underwent CT AP On 07/18/23 that found: Status post left adrenalectomy and nephrectomy. No suspicious or worrisome nodular soft tissue is identified on limited noncontrast CT of the abdomen to suggest local recurrence or residual disease. No pathologically enlarged lymphadenopathy or imaging findings of distant metastases. Innumerable punctate nonobstructive renal calculi identified within the right kidney. No hydronephrosis or obstructive uropathy.  Jadene was admitted to the hospital twice. Her first admission was from 05/20/23 to 05/22/23 for acute respiratory failure with hypoxia exacerbated by influenza and pneumonia. Tyrika was treated with IV steroids and tapered to p.o, mucolytics, and Tamiflu . Her second admission was from 05/24/23 to 05/25/23 for a headache and generalized weakness.   Today, she states that she is doing well overall. Her appetite level is at ***%. Her energy level is at ***%.  PAST MEDICAL HISTORY:   Past Medical History: Past Medical History:  Diagnosis Date   Anxiety    CAD (coronary artery disease)    a. s/p DES to RCA in 07/2016 with residual mid-LAD stenosis   Cancer (HCC)    skin cancer on finger,, cancer left kidney   Cervicalgia    Chest pain, unspecified    Chronic kidney disease    Depression    GERD (gastroesophageal reflux disease)    Headache    "bad ones after my stroke in 2016; only have them when I  get bad news now" (08/02/2016)   Heart murmur    Heavy smoker (more than 20 cigarettes per day)    Scheduled for LDCT screening 02/11/15   History of hiatal hernia    History of kidney stones    "no cysto/OR" (12/10/2014)   Hypercholesteremia    Hypertension    Incarcerated incisional hernia 08/05/2022   Left renal mass    Obesity    Palpitations    Reflux esophagitis    Stroke [facility])    "they said I had a  MINI stroke a few months back/MRI" (12/10/2014)   TIA (transient ischemic attack) 11/28/2014   Maximo Spar 11/28/2014   Type II diabetes mellitus (HCC)     Surgical History: Past Surgical History:  Procedure Laterality Date   ABDOMINAL AORTOGRAM N/A 08/02/2016   Procedure: Abdominal Aortogram;  Surgeon: Sammy Crisp, MD;  Location: [facility] INV[facility]IVE [facility] LAB;  Service: Cardiovascular;  Laterality: N/A;   CAROTID STENT     CHOLECYSTECTOMY OPEN  1978   COLONOSCOPY  2010   single diverticulum in sigmoid colon   CORONARY ANGIOPLASTY WITH STENT PLACEMENT  08/02/2016   to RCA   CORONARY STENT INTERVENTION N/A 08/02/2016   Procedure: Coronary Stent Intervention;  Surgeon: Sammy Crisp, MD;  Location: [facility];  Service:  Cardiovascular;  Laterality: N/A;  RCA   CYSTOSCOPY W/ RETROGRADES Bilateral 07/15/2021   Procedure: CYSTOSCOPY WITH RETROGRADE PYELOGRAM;  Surgeon: Mellie Sprinkle., MD;  Location: [facility];  Service: Urology;  Laterality: Bilateral;   CYSTOSCOPY WITH STENT PLACEMENT Left 07/15/2021   Procedure: CYSTOSCOPY WITH STENT PLACEMENT;  Surgeon: Mellie Sprinkle., MD;  Location: [facility];  Service: Urology;  Laterality: Left;   ENDARTERECTOMY Left 12/02/2014   Procedure: ENDARTERECTOMY CAROTID;  Surgeon: Mayo Speck, MD;  Location: [facility] OR;  Service: Vascular;  Laterality: Left;   HERNIA REPAIR     LAPAROSCOPIC NEPHRECTOMY Left    LAPAROSCOPIC NEPHRECTOMY Left 11/25/2021   Procedure: LEFT LAPAROSCOPIC RADICAL NEPHRECTOMY;  Surgeon: Andrez Banker, MD;   Location: [facility] ORS;  Service: Urology;  Laterality: Left;   LEFT HEART CATH AND CORONARY ANGIOGRAPHY N/A 08/02/2016   Procedure: Left Heart Cath and Coronary Angiography;  Surgeon: Sammy Crisp, MD;  Location: [facility];  Service: Cardiovascular;  Laterality: N/A;   LOWER EXTREMITY ANGIOGRAM  08/02/2016   LOWER EXTREMITY ANGIOGRAPHY N/A 08/02/2016   Procedure: Lower Extremity Angiography;  Surgeon: Sammy Crisp, MD;  Location: [facility];  Service: Cardiovascular;  Laterality: N/A;   NEPHRECTOMY Left    VAGINAL HYSTERECTOMY  1991   "partial"   VENTRAL HERNIA REPAIR N/A 08/05/2022   Procedure: LAPAROSCOPIC VENTRAL WALL HERNIA REPAIR WITH MESH;  Surgeon: Candyce Champagne, MD;  Location: [facility] ORS;  Service: General;  Laterality: N/A;    Social History: Social History   Socioeconomic History   Marital status: Married    Spouse name: Not on file   Number of children: 2   Years of education: 12   Highest education level: Not on file  Occupational History   Not on file  Tobacco Use   Smoking status: Every Day    Current packs/day: 1.50    Average packs/day: 1.5 packs/day for 49.2 years (73.8 ttl pk-yrs)    Types: Cigarettes    Start date: 05/17/1974   Smokeless tobacco: Never  Vaping Use   Vaping status: Never Used  Substance and Sexual Activity   Alcohol use: No    Alcohol/week: 0.0 standard drinks of alcohol   Drug use: No   Sexual activity: Not Currently  Other Topics Concern   Not on file  Social History Narrative   Patient does not drink caffeine.   Patient is right handed.    Social Drivers of Corporate investment banker Strain: Low Risk  (05/18/2022)   [facility]   Overall Financial Resource Strain (CARDIA)    Difficulty of Paying Living Expenses: Not hard at all  Food Insecurity: No Food Insecurity (05/24/2023)   Hunger Vital Sign    Worried About Running Out of Food in the Last Year: Never true    Ran Out of Food in  the Last Year: Never true  Transportation Needs: No Transportation Needs (05/24/2023)   PRAPARE - Administrator, Civil Service (Medical): No    Lack of Transportation (Non-Medical): No  Physical Activity: Not on file  Stress: Not on file  Social Connections: Moderately Isolated (05/20/2023)   Social Connection and Isolation Panel [NHANES]    Frequency of Communication with Friends and Family: Once a week    Frequency of Social Gatherings with Friends and Family: Once a week    Attends Religious Services: 1 to 4 times per year    Active Member of [employer] or Organizations:  No    Attends Club or Organization Meetings: Never    Marital Status: Married  Catering manager Violence: Not At Risk (05/24/2023)   Humiliation, Afraid, Rape, and Kick questionnaire    Fear of Current or Ex-Partner: No    Emotionally Abused: No    Physically Abused: No    Sexually Abused: No    Family History: Family History  Problem Relation Age of Onset   Congestive Heart Failure Mother    COPD Father    Cancer Brother    Cancer Brother    Breast cancer Neg Hx     Current Medications:  Current Outpatient Medications:    acetaminophen  (TYLENOL ) 325 MG tablet, Take 2 tablets (650 mg total) by mouth every 4 (four) hours as needed for headache or mild pain., Disp: , Rfl:    albuterol  (VENTOLIN  HFA) 108 (90 Base) MCG/ACT inhaler, Inhale 1-2 puffs into the lungs every 4 (four) hours as needed for shortness of breath or wheezing., Disp: , Rfl:    ALPRAZolam  (XANAX ) 0.5 MG tablet, Take 0.25 mg by mouth 2 (two) times daily., Disp: , Rfl:    atorvastatin  (LIPITOR ) 40 MG tablet, Take 40 mg by mouth daily. (Patient not taking: Reported on 06/06/2023), Disp: , Rfl:    Budeson-Glycopyrrol-Formoterol  (BREZTRI AEROSPHERE) 160-9-4.8 MCG/ACT AERO, Inhale into the lungs. 1-2 times per day, Disp: , Rfl:    Cholecalciferol (VITAMIN D3) 125 MCG (5000 UT) CAPS, Take 5,000 Units by mouth once a week. Tuesday, Disp: , Rfl:     clopidogrel  (PLAVIX ) 75 MG tablet, Take 1 tablet (75 mg total) by mouth daily., Disp: 30 tablet, Rfl: 3   escitalopram  (LEXAPRO ) 20 MG tablet, Take 20 mg by mouth daily., Disp: , Rfl:    gabapentin  (NEURONTIN ) 300 MG capsule, Take 300 mg by mouth every other day. At night, Disp: , Rfl:    glucose blood (ACCU-CHEK GUIDE) test strip, Use as instructed, Disp: 150 each, Rfl: 2   guaiFENesin -dextromethorphan  (ROBITUSSIN DM) 100-10 MG/5ML syrup, Take 10 mLs by mouth every 8 (eight) hours. (Patient not taking: Reported on 06/06/2023), Disp: 118 mL, Rfl: 0   insulin  degludec (TRESIBA  FLEXTOUCH) 100 UNIT/ML FlexTouch Pen, Inject 10 Units into the skin at bedtime., Disp: 9 mL, Rfl: 3   Insulin  Pen Needle (PEN NEEDLES) 31G X 6 MM MISC, Use to inject insulin  once daily, Disp: 100 each, Rfl: 3   LOKELMA  10 g PACK packet, Take 10 g by mouth See admin instructions. Five times a week, Disp: , Rfl:    nystatin cream (MYCOSTATIN), Apply 1 application  topically 2 (two) times daily as needed (Itching)., Disp: , Rfl: 5   ondansetron  (ZOFRAN ) 4 MG tablet, Take 1 tablet (4 mg total) by mouth every 6 (six) hours as needed for nausea or vomiting., Disp: 60 tablet, Rfl: 0   oxyCODONE -acetaminophen  (PERCOCET/ROXICET) 5-325 MG tablet, Take 1 tablet by mouth every 6 (six) hours as needed for severe pain or moderate pain., Disp: 30 tablet, Rfl: 0   pantoprazole  (PROTONIX ) 40 MG tablet, Take 1 tablet (40 mg total) by mouth daily., Disp: 90 tablet, Rfl: 3   sodium bicarbonate  650 MG tablet, Take 650 mg by mouth 2 (two) times daily., Disp: , Rfl:    Allergies: Allergies  Allergen Reactions   Codeine Shortness Of Breath   Sulfa Antibiotics Other (See Comments)    Unknown    Latex Itching    REVIEW OF SYSTEMS:   Review of Systems  Constitutional:  Negative for chills, fatigue  and fever.  HENT:   Negative for lump/mass, mouth sores, nosebleeds, sore throat and trouble swallowing.   Eyes:  Negative for eye problems.   Respiratory:  Negative for cough and shortness of breath.   Cardiovascular:  Negative for chest pain, leg swelling and palpitations.  Gastrointestinal:  Negative for abdominal pain, constipation, diarrhea, nausea and vomiting.  Genitourinary:  Negative for bladder incontinence, difficulty urinating, dysuria, frequency, hematuria and nocturia.   Musculoskeletal:  Negative for arthralgias, back pain, flank pain, myalgias and neck pain.  Skin:  Negative for itching and rash.  Neurological:  Negative for dizziness, headaches and numbness.  Hematological:  Does not bruise/bleed easily.  Psychiatric/Behavioral:  Negative for depression, sleep disturbance and suicidal ideas. The patient is not nervous/anxious.   All other systems reviewed and are negative.    VITALS:   There were no vitals taken for this visit.  Wt Readings from Last 3 Encounters:  06/06/23 138 lb 6.4 oz (62.8 kg)  06/04/23 137 lb (62.1 kg)  05/31/23 137 lb (62.1 kg)    There is no height or weight on file to calculate BMI.  Performance status (ECOG): 1 - Symptomatic but completely ambulatory  PHYSICAL EXAM:   Physical Exam Vitals and nursing note reviewed. Exam conducted with a chaperone present.  Constitutional:      Appearance: Normal appearance.  Cardiovascular:     Rate and Rhythm: Normal rate and regular rhythm.     Pulses: Normal pulses.     Heart sounds: Normal heart sounds.  Pulmonary:     Effort: Pulmonary effort is normal.     Breath sounds: Normal breath sounds.  Abdominal:     Palpations: Abdomen is soft. There is no hepatomegaly, splenomegaly or mass.     Tenderness: There is no abdominal tenderness.  Musculoskeletal:     Right lower leg: No edema.     Left lower leg: No edema.  Lymphadenopathy:     Cervical: No cervical adenopathy.     Right cervical: No superficial, deep or posterior cervical adenopathy.    Left cervical: No superficial, deep or posterior cervical adenopathy.     Upper Body:      Right upper body: No supraclavicular or axillary adenopathy.     Left upper body: No supraclavicular or axillary adenopathy.  Neurological:     General: No focal deficit present.     Mental Status: She is alert and oriented to person, place, and time.  Psychiatric:        Mood and Affect: Mood normal.        Behavior: Behavior normal.     LABS:      Latest Ref Rng & Units 07/18/2023   12:12 PM 06/04/2023    5:57 PM 05/24/2023    9:36 AM  CBC  WBC 4.0 - 10.5 K/uL 9.3  8.4  12.7   Hemoglobin 12.0 - 15.0 g/dL 16.1  9.7  09.6   Hematocrit 36.0 - 46.0 % 36.3  30.9  38.3   Platelets 150 - 400 K/uL 259  230  385       Latest Ref Rng & Units 07/18/2023   12:12 PM 06/04/2023    5:57 PM 05/24/2023    9:36 AM  CMP  Glucose 70 - 99 mg/dL 045  409  811   BUN 8 - 23 mg/dL 51  38  52   Creatinine 0.44 - 1.00 mg/dL 9.14  7.82  9.56   Sodium 135 - 145 mmol/L 135  139  136   Potassium 3.5 - 5.1 mmol/L 5.2  5.1  4.6   Chloride 98 - 111 mmol/L 103  103  103   CO2 22 - 32 mmol/L 19  24  21    Calcium  8.9 - 10.3 mg/dL 9.1  7.8  9.0   Total Protein 6.5 - 8.1 g/dL 7.1  5.9  6.5   Total Bilirubin 0.0 - 1.2 mg/dL 0.4  0.4  1.1   Alkaline Phos 38 - 126 U/L 63  76  65   AST 15 - 41 U/L 15  15  15    ALT 0 - 44 U/L 15  17  17       No results found for: "CEA1", "CEA" / No results found for: "CEA1", "CEA" No results found for: "PSA1" No results found for: "JYN829" No results found for: "CAN125"  No results found for: "TOTALPROTELP", "ALBUMINELP", "A1GS", "A2GS", "BETS", "BETA2SER", "GAMS", "MSPIKE", "SPEI" Lab Results  Component Value Date   TIBC 281 07/18/2023   TIBC 297 01/10/2023   TIBC 231 (L) 10/11/2022   FERRITIN 156 07/18/2023   FERRITIN 59 01/10/2023   FERRITIN 121 10/11/2022   IRONPCTSAT 32 (H) 07/18/2023   IRONPCTSAT 23 01/10/2023   IRONPCTSAT 19 10/11/2022   Lab Results  Component Value Date   LDH 123 03/31/2022     STUDIES:   CT ABDOMEN PELVIS WO CONTRAST Result Date:  07/18/2023 CT ABDOMEN PELVIS WO CONTRAST 07/18/2023 12:13 PM CDT CLINICAL HISTORY: Female, 68 years old. Kidney cancer, active surveillance. F/u kidney cancer COMPARISON: CT chest abdomen and pelvis from 01/10/2023 PROCEDURE COMMENTS: CT of the abdomen and pelvis was performed without the administration of IV contrast. Oral contrast was not administered prior to the examination. CT was performed using one or more dose reduction techniques including automated exposure control, adjustment of the mA and/or kV according to patient size, and/or use of iterative reconstruction technique. Unless otherwise stated, incidental findings identified in this report do not require routine follow-up. 3-D reconstructions if necessary were generated. FINDINGS: Lower thorax: Bilateral lung bases are clear. No pleural or pericardial effusion. Heart is mildly enlarged in size. Liver and biliary tree: Liver is normal in morphology. No focal lesions are identified. No intra- or extrahepatic biliary dilatation. Gallbladder: Gallbladder surgically absent Spleen: Normal noncontrast CT appearance. Pancreas: Normal noncontrast CT appearance Adrenal glands: Status post left adrenalectomy. Normal appearance of the right adrenal gland. Kidneys and ureters: Status post left adrenalectomy and left nephrectomy. No suspicious or worrisome nodular soft tissue is identified on limited noncontrast CT of the abdomen The right kidney is mildly atrophic. No hydronephrosis. Multiple punctate nonobstructing renal calculi identified within the upper and lower pole moieties. Gastrointestinal tract: Visualized bowel demonstrate no evidence of wall thickening or obstruction. Mild/moderate stool within the colon, correlate with history of constipation. Normal appendix. Bladder: Bladder is underdistended and therefore incompletely evaluated. Pelvis Organs: The uterus is not well visualized and likely surgically absent. No suspicious or worrisome adnexal mass.  Peritoneal cavity: No free fluid or free air. Vasculature: Moderate to marked scattered atherosclerotic calcifications without abdominal aortic aneurysm. Lymph nodes: No pathologically enlarged lymphadenopathy within the abdomen or pelvis. Abdominal wall: Normal. Musculoskeletal: No suspicious osseous lesions. Multilevel degenerative changes of the lower lumbar spine. IMPRESSION: 1. Status post left adrenalectomy and nephrectomy. No suspicious or worrisome nodular soft tissue is identified on limited noncontrast CT of the abdomen to suggest local recurrence or residual disease. 2. No pathologically enlarged lymphadenopathy or imaging findings of distant metastases.  3. Innumerable punctate nonobstructive renal calculi identified within the right kidney. No hydronephrosis or obstructive uropathy. 4.  Additional incidental findings as above. Electronically signed by: Edrie Gower MD 07/18/2023 01:37 PM EDT RP Workstation: [id number]

## 2023-07-27 ENCOUNTER — Inpatient Hospital Stay (HOSPITAL_BASED_OUTPATIENT_CLINIC_OR_DEPARTMENT_OTHER): Admitting: Hematology

## 2023-07-27 VITALS — BP 104/57 | HR 83 | Temp 98.3°F | Resp 18 | Wt 138.4 lb

## 2023-07-27 DIAGNOSIS — C642 Malignant neoplasm of left kidney, except renal pelvis: Secondary | ICD-10-CM

## 2023-07-27 DIAGNOSIS — D509 Iron deficiency anemia, unspecified: Secondary | ICD-10-CM

## 2023-07-27 NOTE — Progress Notes (Signed)
 [facility] [address], [place] [address]    Clinic Day:  07/27/2023  Referring physician: Kathyleen Parkins, MD  Patient Care Team: Kathyleen Parkins, MD as PCP - General (Internal Medicine) Amanda Jungling Joyceann No, MD as PCP - Cardiology (Cardiology) Paulett Boros, MD as Medical Oncologist (Medical Oncology) Andrez Banker, MD as Attending Physician (Urology) Wendel Hals, NP as Nurse Practitioner (Endocrinology) Candyce Champagne, MD as Consulting Physician (General Surgery)   ASSESSMENT & PLAN:   Assessment: 1. 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  Moderate to marked severity left-sided hydronephrosis.  Numerous bilateral nonobstructing renal calculi. - MRI abdomen (08/14/2021): Central lower pole left kidney 4 x 3.7 x 5.3 cm enhancing mass, extension into the proximal left ureter.  No renal vein involvement or evidence of abdominal metastatic disease.  Multiple small abdominal retroperitoneal lymph nodes, nonpathologic by size criteria.  Right abdominal wall subcutaneous nodule nonspecific but new since 07/13/2021. - Left kidney biopsy (09/10/2021): Clear-cell renal cell carcinoma, nuclear grade 2.  No tumor necrosis. - Left nephrectomy on 11/25/2021: Pathology 3.2 cm clear-cell renal cell carcinoma, grade 4, negative sarcoid features, focal rhabdoid features, tumor necrosis negative.  PT1APN0.   2. Social/family history: - She lives at home with her husband.  She retired after working in [employer].  She is current active smoker, 2 packs/day for 48 years. - Brother had throat cancer and was smoker.  Maternal uncle died of cancer.  Paternal uncle also had cancer.  Maternal grandmother had lung cancer.    Plan: 1. Stage I (T1 aN0 M0)  left kidney clear-cell renal cell carcinoma, grade 4: - She denies any new onset pains or hematuria. - Labs from 07/18/2023: Creatinine 2.93.  Potassium 5.2.  LFTs are normal.  CBC grossly normal. - CTAP on 07/18/2023: Stable lung nodules since 2016.  No adenopathy or evidence of recurrence.  Nonobstructive multiple right kidney stones with no evidence of hydronephrosis. - Recommend follow-up in 6 months with repeat CTAP without IV contrast.   2.  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 - Monoferric  1 g was given on 01/21/2023. - Iron panel today is 156 with percent saturation 32.  Hemoglobin is 11.5. - Continue iron tablet 3 times a week.  Will repeat ferritin and iron panel in 6 months.  3.  CKD/hyperkalemia: - She is on Lokelma .  Creatinine is slightly worse and potassium is 5.2.  She will continue close follow-up with Dr. Carrolyn Clan.      Orders Placed This Encounter  Procedures   CT ABDOMEN PELVIS WO CONTRAST    Standing Status:   Future    Expected Date:   01/27/2024    Expiration Date:   07/26/2024    Preferred imaging location?:   [facility]    If indicated for the ordered procedure, I authorize the administration of oral contrast media per Radiology protocol:   Yes    Does the patient have a contrast media/X-ray dye allergy?:   No   CBC with Differential    Standing Status:   Future    Expected Date:   01/23/2024    Expiration Date:   07/26/2024   Comprehensive metabolic panel    Standing Status:   Future  Expected Date:   01/23/2024    Expiration Date:   07/26/2024   Iron and TIBC ([facility] ONLY)    Standing Status:   Future    Expected Date:   01/23/2024    Expiration Date:   07/26/2024   Ferritin    Standing Status:   Future    Expected Date:   01/23/2024    Expiration Date:   07/26/2024      Hurman Maiden R Teague,acting as a scribe for Paulett Boros, MD.,have documented all relevant documentation on  the behalf of Paulett Boros, MD,as directed by  Paulett Boros, MD while in the presence of Paulett Boros, MD.  I, Paulett Boros MD, have reviewed the above documentation for accuracy and completeness, and I agree with the above.     Paulett Boros, MD   5/14/20259:58 AM  CHIEF COMPLAINT:   Diagnosis: left kidney clear cell renal cell carcinoma    Cancer Staging  Renal cell cancer, left [facility]) Staging form: Kidney, AJCC 8th Edition - Clinical stage from 10/01/2021: Stage I (cT1a, cN0, cM0) - Signed by Paulett Boros, MD on 12/22/2021    Prior Therapy: Left radical nephrectomy on 11/25/2021   Current Therapy:  Surveillance    HISTORY OF PRESENT ILLNESS:   Oncology History  Renal cell cancer, left (HCC)  10/01/2021 Initial Diagnosis   Renal cell cancer, left (HCC)   10/01/2021 Cancer Staging   Staging form: Kidney, AJCC 8th Edition - Clinical stage from 10/01/2021: Stage I (cT1a, cN0, cM0) - Signed by Paulett Boros, MD on 12/22/2021 Histopathologic type: Clear cell adenocarcinoma, NOS Stage prefix: Initial diagnosis Histologic grade (G): G4 Histologic grading system: 4 grade system Histologic sub-type: Clear cell Sarcomatoid features: Absent Rhabdoid features: Present Tumor necrosis: Absent      INTERVAL HISTORY:   Grace Hess is a 68 y.o. female presenting to clinic today for follow up of left kidney clear cell renal cell carcinoma. She was last seen by me on 01/18/23.  Since her last visit, she underwent CT AP on 07/18/23 that found: Status post left adrenalectomy and nephrectomy. No suspicious or worrisome nodular soft tissue is identified on limited noncontrast CT of the abdomen to suggest local recurrence or residual disease. No pathologically enlarged lymphadenopathy or imaging findings of distant metastases. Innumerable punctate nonobstructive renal calculi identified within the right kidney. No hydronephrosis or obstructive  uropathy.  Hadlee has been admitted to the hospital twice since her last visit. Her first admission was from 05/20/23 to 05/22/23 for acute respiratory failure with hypoxia exacerbated by influenza A and pneumonia. She was given IV steroids, tapered to p.o., and Tamiflu . Her second admission was from 05/24/23 to 05/25/23 for headaches and generalized weakness.  Today, she states that she is doing well overall. Her appetite level is at 100%. Her energy level is at 75%.   PAST MEDICAL HISTORY:   Past Medical History: Past Medical History:  Diagnosis Date   Anxiety    CAD (coronary artery disease)    a. s/p DES to RCA in 07/2016 with residual mid-LAD stenosis   Cancer (HCC)    skin cancer on finger,, cancer left kidney   Cervicalgia    Chest pain, unspecified    Chronic kidney disease    Depression    GERD (gastroesophageal reflux disease)    Headache    "bad ones after my stroke in 2016; only have them when I get bad news now" (08/02/2016)   Heart murmur    Heavy smoker (more than 20  cigarettes per day)    Scheduled for LDCT screening 02/11/15   History of hiatal hernia    History of kidney stones    "no cysto/OR" (12/10/2014)   Hypercholesteremia    Hypertension    Incarcerated incisional hernia 08/05/2022   Left renal mass    Obesity    Palpitations    Reflux esophagitis    Stroke [facility])    "they said I had a  MINI stroke a few months back/MRI" (12/10/2014)   TIA (transient ischemic attack) 11/28/2014   Maximo Spar 11/28/2014   Type II diabetes mellitus (HCC)     Surgical History: Past Surgical History:  Procedure Laterality Date   ABDOMINAL AORTOGRAM N/A 08/02/2016   Procedure: Abdominal Aortogram;  Surgeon: Sammy Crisp, MD;  Location: [facility];  Service: Cardiovascular;  Laterality: N/A;   CAROTID STENT     CHOLECYSTECTOMY OPEN  1978   COLONOSCOPY  2010   single diverticulum in sigmoid colon   CORONARY ANGIOPLASTY WITH STENT PLACEMENT  08/02/2016   to RCA    CORONARY STENT INTERVENTION N/A 08/02/2016   Procedure: Coronary Stent Intervention;  Surgeon: Sammy Crisp, MD;  Location: [facility];  Service: Cardiovascular;  Laterality: N/A;  RCA   CYSTOSCOPY W/ RETROGRADES Bilateral 07/15/2021   Procedure: CYSTOSCOPY WITH RETROGRADE PYELOGRAM;  Surgeon: Mellie Sprinkle., MD;  Location: [facility];  Service: Urology;  Laterality: Bilateral;   CYSTOSCOPY WITH STENT PLACEMENT Left 07/15/2021   Procedure: CYSTOSCOPY WITH STENT PLACEMENT;  Surgeon: Mellie Sprinkle., MD;  Location: [facility];  Service: Urology;  Laterality: Left;   ENDARTERECTOMY Left 12/02/2014   Procedure: ENDARTERECTOMY CAROTID;  Surgeon: Mayo Speck, MD;  Location: [facility] OR;  Service: Vascular;  Laterality: Left;   HERNIA REPAIR     LAPAROSCOPIC NEPHRECTOMY Left    LAPAROSCOPIC NEPHRECTOMY Left 11/25/2021   Procedure: LEFT LAPAROSCOPIC RADICAL NEPHRECTOMY;  Surgeon: Andrez Banker, MD;  Location: [facility];  Service: Urology;  Laterality: Left;   LEFT HEART CATH AND CORONARY ANGIOGRAPHY N/A 08/02/2016   Procedure: Left Heart Cath and Coronary Angiography;  Surgeon: Sammy Crisp, MD;  Location: [facility];  Service: Cardiovascular;  Laterality: N/A;   LOWER EXTREMITY ANGIOGRAM  08/02/2016   LOWER EXTREMITY ANGIOGRAPHY N/A 08/02/2016   Procedure: Lower Extremity Angiography;  Surgeon: Sammy Crisp, MD;  Location: [facility];  Service: Cardiovascular;  Laterality: N/A;   NEPHRECTOMY Left    VAGINAL HYSTERECTOMY  1991   "partial"   VENTRAL HERNIA REPAIR N/A 08/05/2022   Procedure: LAPAROSCOPIC VENTRAL WALL HERNIA REPAIR WITH MESH;  Surgeon: Candyce Champagne, MD;  Location: [facility];  Service: General;  Laterality: N/A;    Social History: Social History   Socioeconomic History   Marital status: Married    Spouse name: Not on file   Number of children: 2   Years of education: 12   Highest education level: Not on file  Occupational History   Not on  file  Tobacco Use   Smoking status: Every Day    Current packs/day: 1.50    Average packs/day: 1.5 packs/day for 49.2 years (73.8 ttl pk-yrs)    Types: Cigarettes    Start date: 05/17/1974   Smokeless tobacco: Never  Vaping Use   Vaping status: Never Used  Substance and Sexual Activity   Alcohol use: No    Alcohol/week: 0.0 standard drinks of alcohol   Drug use: No   Sexual activity: Not Currently  Other Topics Concern  Not on file  Social History Narrative   Patient does not drink caffeine.   Patient is right handed.    Social Drivers of Corporate investment banker Strain: Low Risk  (05/18/2022)   Received from [facility], [facility]   Overall Financial Resource Strain (CARDIA)    Difficulty of Paying Living Expenses: Not hard at all  Food Insecurity: No Food Insecurity (05/24/2023)   Hunger Vital Sign    Worried About Running Out of Food in the Last Year: Never true    Ran Out of Food in the Last Year: Never true  Transportation Needs: No Transportation Needs (05/24/2023)   PRAPARE - Administrator, Civil Service (Medical): No    Lack of Transportation (Non-Medical): No  Physical Activity: Not on file  Stress: Not on file  Social Connections: Moderately Isolated (05/20/2023)   Social Connection and Isolation Panel [NHANES]    Frequency of Communication with Friends and Family: Once a week    Frequency of Social Gatherings with Friends and Family: Once a week    Attends Religious Services: 1 to 4 times per year    Active Member of [employer] or Organizations: No    Attends Banker Meetings: Never    Marital Status: Married  Catering manager Violence: Not At Risk (05/24/2023)   Humiliation, Afraid, Rape, and Kick questionnaire    Fear of Current or Ex-Partner: No    Emotionally Abused: No    Physically Abused: No    Sexually Abused: No    Family History: Family History  Problem Relation Age of Onset   Congestive Heart Failure Mother    COPD  Father    Cancer Brother    Cancer Brother    Breast cancer Neg Hx     Current Medications:  Current Outpatient Medications:    acetaminophen  (TYLENOL ) 325 MG tablet, Take 2 tablets (650 mg total) by mouth every 4 (four) hours as needed for headache or mild pain., Disp: , Rfl:    albuterol  (VENTOLIN  HFA) 108 (90 Base) MCG/ACT inhaler, Inhale 1-2 puffs into the lungs every 4 (four) hours as needed for shortness of breath or wheezing., Disp: , Rfl:    ALPRAZolam  (XANAX ) 0.5 MG tablet, Take 0.25 mg by mouth 2 (two) times daily., Disp: , Rfl:    atorvastatin  (LIPITOR ) 40 MG tablet, Take 40 mg by mouth daily., Disp: , Rfl:    Budeson-Glycopyrrol-Formoterol  (BREZTRI AEROSPHERE) 160-9-4.8 MCG/ACT AERO, Inhale into the lungs. 1-2 times per day, Disp: , Rfl:    Cholecalciferol (VITAMIN D3) 125 MCG (5000 UT) CAPS, Take 5,000 Units by mouth once a week. Tuesday, Disp: , Rfl:    clopidogrel  (PLAVIX ) 75 MG tablet, Take 1 tablet (75 mg total) by mouth daily., Disp: 30 tablet, Rfl: 3   escitalopram  (LEXAPRO ) 20 MG tablet, Take 20 mg by mouth daily., Disp: , Rfl:    gabapentin  (NEURONTIN ) 300 MG capsule, Take 300 mg by mouth every other day. At night, Disp: , Rfl:    glucose blood (ACCU-CHEK GUIDE) test strip, Use as instructed, Disp: 150 each, Rfl: 2   guaiFENesin -dextromethorphan  (ROBITUSSIN DM) 100-10 MG/5ML syrup, Take 10 mLs by mouth every 8 (eight) hours., Disp: 118 mL, Rfl: 0   insulin  degludec (TRESIBA  FLEXTOUCH) 100 UNIT/ML FlexTouch Pen, Inject 10 Units into the skin at bedtime., Disp: 9 mL, Rfl: 3   Insulin  Pen Needle (PEN NEEDLES) 31G X 6 MM MISC, Use to inject insulin  once daily, Disp: 100 each, Rfl:  3   LOKELMA  10 g PACK packet, Take 10 g by mouth See admin instructions. Five times a week, Disp: , Rfl:    nystatin cream (MYCOSTATIN), Apply 1 application  topically 2 (two) times daily as needed (Itching)., Disp: , Rfl: 5   ondansetron  (ZOFRAN ) 4 MG tablet, Take 1 tablet (4 mg total) by mouth  every 6 (six) hours as needed for nausea or vomiting., Disp: 60 tablet, Rfl: 0   oxyCODONE -acetaminophen  (PERCOCET/ROXICET) 5-325 MG tablet, Take 1 tablet by mouth every 6 (six) hours as needed for severe pain or moderate pain., Disp: 30 tablet, Rfl: 0   pantoprazole  (PROTONIX ) 40 MG tablet, Take 1 tablet (40 mg total) by mouth daily., Disp: 90 tablet, Rfl: 3   sodium bicarbonate  650 MG tablet, Take 650 mg by mouth 2 (two) times daily., Disp: , Rfl:    Allergies: Allergies  Allergen Reactions   Codeine Shortness Of Breath   Sulfa Antibiotics Other (See Comments)    Unknown    Latex Itching    REVIEW OF SYSTEMS:   Review of Systems  Constitutional:  Negative for chills, fatigue and fever.  HENT:   Negative for lump/mass, mouth sores, nosebleeds, sore throat and trouble swallowing.   Eyes:  Negative for eye problems.  Respiratory:  Positive for shortness of breath. Negative for cough.   Cardiovascular:  Negative for chest pain, leg swelling and palpitations.  Gastrointestinal:  Negative for abdominal pain, constipation, diarrhea, nausea and vomiting.  Genitourinary:  Negative for bladder incontinence, difficulty urinating, dysuria, frequency, hematuria and nocturia.   Musculoskeletal:  Positive for back pain. Negative for arthralgias, flank pain, myalgias and neck pain.  Skin:  Negative for itching and rash.  Neurological:  Positive for numbness. Negative for dizziness and headaches.  Hematological:  Does not bruise/bleed easily.  Psychiatric/Behavioral:  Negative for depression, sleep disturbance and suicidal ideas. The patient is not nervous/anxious.   All other systems reviewed and are negative.    VITALS:   Blood pressure (!) 104/57, pulse 83, temperature 98.3 F (36.8 C), temperature source Oral, resp. rate 18, weight 138 lb 7.2 oz (62.8 kg), SpO2 97%.  Wt Readings from Last 3 Encounters:  07/27/23 138 lb 7.2 oz (62.8 kg)  06/06/23 138 lb 6.4 oz (62.8 kg)  06/04/23 137 lb  (62.1 kg)    Body mass index is 23.04 kg/m.  Performance status (ECOG): 1 - Symptomatic but completely ambulatory  PHYSICAL EXAM:   Physical Exam Vitals and nursing note reviewed. Exam conducted with a chaperone present.  Constitutional:      Appearance: Normal appearance.  Cardiovascular:     Rate and Rhythm: Normal rate and regular rhythm.     Pulses: Normal pulses.     Heart sounds: Normal heart sounds.  Pulmonary:     Effort: Pulmonary effort is normal.     Breath sounds: Normal breath sounds.  Abdominal:     Palpations: Abdomen is soft. There is no hepatomegaly, splenomegaly or mass.     Tenderness: There is no abdominal tenderness.  Musculoskeletal:     Right lower leg: No edema.     Left lower leg: No edema.  Lymphadenopathy:     Cervical: No cervical adenopathy.     Right cervical: No superficial, deep or posterior cervical adenopathy.    Left cervical: No superficial, deep or posterior cervical adenopathy.     Upper Body:     Right upper body: No supraclavicular or axillary adenopathy.     Left upper  body: No supraclavicular or axillary adenopathy.  Neurological:     General: No focal deficit present.     Mental Status: She is alert and oriented to person, place, and time.  Psychiatric:        Mood and Affect: Mood normal.        Behavior: Behavior normal.     LABS:      Latest Ref Rng & Units 07/18/2023   12:12 PM 06/04/2023    5:57 PM 05/24/2023    9:36 AM  CBC  WBC 4.0 - 10.5 K/uL 9.3  8.4  12.7   Hemoglobin 12.0 - 15.0 g/dL 62.9  9.7  52.8   Hematocrit 36.0 - 46.0 % 36.3  30.9  38.3   Platelets 150 - 400 K/uL 259  230  385       Latest Ref Rng & Units 07/18/2023   12:12 PM 06/04/2023    5:57 PM 05/24/2023    9:36 AM  CMP  Glucose 70 - 99 mg/dL 413  244  010   BUN 8 - 23 mg/dL 51  38  52   Creatinine 0.44 - 1.00 mg/dL 2.72  5.36  6.44   Sodium 135 - 145 mmol/L 135  139  136   Potassium 3.5 - 5.1 mmol/L 5.2  5.1  4.6   Chloride 98 - 111 mmol/L 103   103  103   CO2 22 - 32 mmol/L 19  24  21    Calcium  8.9 - 10.3 mg/dL 9.1  7.8  9.0   Total Protein 6.5 - 8.1 g/dL 7.1  5.9  6.5   Total Bilirubin 0.0 - 1.2 mg/dL 0.4  0.4  1.1   Alkaline Phos 38 - 126 U/L 63  76  65   AST 15 - 41 U/L 15  15  15    ALT 0 - 44 U/L 15  17  17       No results found for: "CEA1", "CEA" / No results found for: "CEA1", "CEA" No results found for: "PSA1" No results found for: "IHK742" No results found for: "CAN125"  No results found for: "TOTALPROTELP", "ALBUMINELP", "A1GS", "A2GS", "BETS", "BETA2SER", "GAMS", "MSPIKE", "SPEI" Lab Results  Component Value Date   TIBC 281 07/18/2023   TIBC 297 01/10/2023   TIBC 231 (L) 10/11/2022   FERRITIN 156 07/18/2023   FERRITIN 59 01/10/2023   FERRITIN 121 10/11/2022   IRONPCTSAT 32 (H) 07/18/2023   IRONPCTSAT 23 01/10/2023   IRONPCTSAT 19 10/11/2022   Lab Results  Component Value Date   LDH 123 03/31/2022     STUDIES:   CT ABDOMEN PELVIS WO CONTRAST Result Date: 07/18/2023 CT ABDOMEN PELVIS WO CONTRAST 07/18/2023 12:13 PM CDT CLINICAL HISTORY: Female, 68 years old. Kidney cancer, active surveillance. F/u kidney cancer COMPARISON: CT chest abdomen and pelvis from 01/10/2023 PROCEDURE COMMENTS: CT of the abdomen and pelvis was performed without the administration of IV contrast. Oral contrast was not administered prior to the examination. CT was performed using one or more dose reduction techniques including automated exposure control, adjustment of the mA and/or kV according to patient size, and/or use of iterative reconstruction technique. Unless otherwise stated, incidental findings identified in this report do not require routine follow-up. 3-D reconstructions if necessary were generated. FINDINGS: Lower thorax: Bilateral lung bases are clear. No pleural or pericardial effusion. Heart is mildly enlarged in size. Liver and biliary tree: Liver is normal in morphology. No focal lesions are identified. No intra- or  extrahepatic biliary dilatation.  Gallbladder: Gallbladder surgically absent Spleen: Normal noncontrast CT appearance. Pancreas: Normal noncontrast CT appearance Adrenal glands: Status post left adrenalectomy. Normal appearance of the right adrenal gland. Kidneys and ureters: Status post left adrenalectomy and left nephrectomy. No suspicious or worrisome nodular soft tissue is identified on limited noncontrast CT of the abdomen The right kidney is mildly atrophic. No hydronephrosis. Multiple punctate nonobstructing renal calculi identified within the upper and lower pole moieties. Gastrointestinal tract: Visualized bowel demonstrate no evidence of wall thickening or obstruction. Mild/moderate stool within the colon, correlate with history of constipation. Normal appendix. Bladder: Bladder is underdistended and therefore incompletely evaluated. Pelvis Organs: The uterus is not well visualized and likely surgically absent. No suspicious or worrisome adnexal mass. Peritoneal cavity: No free fluid or free air. Vasculature: Moderate to marked scattered atherosclerotic calcifications without abdominal aortic aneurysm. Lymph nodes: No pathologically enlarged lymphadenopathy within the abdomen or pelvis. Abdominal wall: Normal. Musculoskeletal: No suspicious osseous lesions. Multilevel degenerative changes of the lower lumbar spine. IMPRESSION: 1. Status post left adrenalectomy and nephrectomy. No suspicious or worrisome nodular soft tissue is identified on limited noncontrast CT of the abdomen to suggest local recurrence or residual disease. 2. No pathologically enlarged lymphadenopathy or imaging findings of distant metastases. 3. Innumerable punctate nonobstructive renal calculi identified within the right kidney. No hydronephrosis or obstructive uropathy. 4.  Additional incidental findings as above. Electronically signed by: Edrie Gower MD 07/18/2023 01:37 PM EDT RP Workstation: [id number]

## 2023-07-27 NOTE — Patient Instructions (Addendum)
 [facility] at [facility] Discharge Instructions   You were seen and examined today by Dr. Cheree Cords.  He reviewed the results of your lab work which are normal/stable.   He reviewed the results of your CT scan which did not show any evidence of cancer,  We will see you back in 6 months. We will repeat lab work and a CT scan prior to this visit.    Return as scheduled.    Thank you for choosing [facility] at [facility] to provide your oncology and hematology care.  To afford each patient quality time with our provider, please arrive at least 15 minutes before your scheduled appointment time.   If you have a lab appointment with the Cancer Center please come in thru the Main Entrance and check in at the main information desk.  You need to re-schedule your appointment should you arrive 10 or more minutes late.  We strive to give you quality time with our providers, and arriving late affects you and other patients whose appointments are after yours.  Also, if you no show three or more times for appointments you may be dismissed from the clinic at the providers discretion.     Again, thank you for choosing [facility]  Our hope is that these requests will decrease the amount of time that you wait before being seen by our physicians.       _____________________________________________________________  Should you have questions after your visit to [facility], please contact our office at [phone] and follow the prompts.  Our office hours are 8:00 a.m. and 4:30 p.m. Monday - Friday.  Please note that voicemails left after 4:00 p.m. may not be returned until the following business day.  We are closed weekends and major holidays.  You do have access to a nurse 24-7, just call the main number to the clinic [phone] and do not press any options, hold on the line and a nurse will answer the phone.    For prescription  refill requests, have your pharmacy contact our office and allow 72 hours.    Due to Covid, you will need to wear a mask upon entering the hospital. If you do not have a mask, a mask will be given to you at the Main Entrance upon arrival. For doctor visits, patients may have 1 support person age 28 or older with them. For treatment visits, patients can not have anyone with them due to social distancing guidelines and our immunocompromised population.

## 2023-08-11 ENCOUNTER — Encounter: Payer: Self-pay | Admitting: Nurse Practitioner

## 2023-08-11 ENCOUNTER — Ambulatory Visit (INDEPENDENT_AMBULATORY_CARE_PROVIDER_SITE_OTHER): Payer: PPO | Admitting: Nurse Practitioner

## 2023-08-11 VITALS — BP 136/72 | HR 82 | Ht 65.0 in | Wt 141.6 lb

## 2023-08-11 DIAGNOSIS — E1159 Type 2 diabetes mellitus with other circulatory complications: Secondary | ICD-10-CM

## 2023-08-11 DIAGNOSIS — E782 Mixed hyperlipidemia: Secondary | ICD-10-CM

## 2023-08-11 DIAGNOSIS — Z794 Long term (current) use of insulin: Secondary | ICD-10-CM

## 2023-08-11 DIAGNOSIS — E559 Vitamin D deficiency, unspecified: Secondary | ICD-10-CM | POA: Diagnosis not present

## 2023-08-11 DIAGNOSIS — I10 Essential (primary) hypertension: Secondary | ICD-10-CM

## 2023-08-11 LAB — POCT GLYCOSYLATED HEMOGLOBIN (HGB A1C): Hemoglobin A1C: 7.8 % — AB (ref 4.0–5.6)

## 2023-08-11 MED ORDER — TRESIBA FLEXTOUCH 100 UNIT/ML ~~LOC~~ SOPN: 14.0000 [IU] | PEN_INJECTOR | Freq: Every day | SUBCUTANEOUS | 3 refills | Status: DC

## 2023-08-11 NOTE — Progress Notes (Signed)
 08/11/2023, 11:42 AM                                Endocrinology follow-up note   Subjective:    Patient ID: Grace Hess, female    DOB: 1955-04-12.  NETANYA Hess is being seen in follow-up for management of currently uncontrolled symptomatic diabetes requested by  Grace Parkins, MD.   Past Medical History:  Diagnosis Date   Anxiety    CAD (coronary artery disease)    a. s/p DES to RCA in 07/2016 with residual mid-LAD stenosis   Cancer (HCC)    skin cancer on finger,, cancer left kidney   Cervicalgia    Chest pain, unspecified    Chronic kidney disease    Depression    GERD (gastroesophageal reflux disease)    Headache    "bad ones after my stroke in 2016; only have them when I get bad news now" (08/02/2016)   Heart murmur    Heavy smoker (more than 20 cigarettes per day)    Scheduled for LDCT screening 02/11/15   History of hiatal hernia    History of kidney stones    "no cysto/OR" (12/10/2014)   Hypercholesteremia    Hypertension    Incarcerated incisional hernia 08/05/2022   Left renal mass    Obesity    Palpitations    Reflux esophagitis    Stroke [facility])    "they said I had a  MINI stroke a few months back/MRI" (12/10/2014)   TIA (transient ischemic attack) 11/28/2014   Grace Hess 11/28/2014   Type II diabetes mellitus (HCC)     Past Surgical History:  Procedure Laterality Date   ABDOMINAL AORTOGRAM N/A 08/02/2016   Procedure: Abdominal Aortogram;  Surgeon: Sammy Crisp, MD;  Location: [facility];  Service: Cardiovascular;  Laterality: N/A;   CAROTID STENT     CHOLECYSTECTOMY OPEN  1978   COLONOSCOPY  2010   single diverticulum in sigmoid colon   CORONARY ANGIOPLASTY WITH STENT PLACEMENT  08/02/2016   to RCA   CORONARY STENT INTERVENTION N/A 08/02/2016   Procedure: Coronary Stent Intervention;  Surgeon: Sammy Crisp, MD;  Location: [facility];   Service: Cardiovascular;  Laterality: N/A;  RCA   CYSTOSCOPY W/ RETROGRADES Bilateral 07/15/2021   Procedure: CYSTOSCOPY WITH RETROGRADE PYELOGRAM;  Surgeon: Mellie Sprinkle., MD;  Location: [facility];  Service: Urology;  Laterality: Bilateral;   CYSTOSCOPY WITH STENT PLACEMENT Left 07/15/2021   Procedure: CYSTOSCOPY WITH STENT PLACEMENT;  Surgeon: Mellie Sprinkle., MD;  Location: [facility];  Service: Urology;  Laterality: Left;   ENDARTERECTOMY Left 12/02/2014   Procedure: ENDARTERECTOMY CAROTID;  Surgeon: Mayo Speck, MD;  Location: [facility];  Service: Vascular;  Laterality: Left;   HERNIA REPAIR     LAPAROSCOPIC NEPHRECTOMY Left    LAPAROSCOPIC NEPHRECTOMY Left 11/25/2021   Procedure: LEFT LAPAROSCOPIC RADICAL NEPHRECTOMY;  Surgeon: Andrez Banker, MD;  Location: [facility];  Service: Urology;  Laterality: Left;   LEFT HEART CATH AND CORONARY ANGIOGRAPHY N/A 08/02/2016  Procedure: Left Heart Cath and Coronary Angiography;  Surgeon: Sammy Crisp, MD;  Location: [facility];  Service: Cardiovascular;  Laterality: N/A;   LOWER EXTREMITY ANGIOGRAM  08/02/2016   LOWER EXTREMITY ANGIOGRAPHY N/A 08/02/2016   Procedure: Lower Extremity Angiography;  Surgeon: Sammy Crisp, MD;  Location: [facility];  Service: Cardiovascular;  Laterality: N/A;   NEPHRECTOMY Left    VAGINAL HYSTERECTOMY  1991   "partial"   VENTRAL HERNIA REPAIR N/A 08/05/2022   Procedure: LAPAROSCOPIC VENTRAL WALL HERNIA REPAIR WITH MESH;  Surgeon: Candyce Champagne, MD;  Location: [facility];  Service: General;  Laterality: N/A;    Social History   Socioeconomic History   Marital status: Married    Spouse name: Not on file   Number of children: 2   Years of education: 12   Highest education level: Not on file  Occupational History   Not on file  Tobacco Use   Smoking status: Every Day    Current packs/day: 1.50    Average packs/day: 1.5 packs/day for 49.2 years (73.9 ttl pk-yrs)    Types: Cigarettes     Start date: 05/17/1974   Smokeless tobacco: Never  Vaping Use   Vaping status: Never Used  Substance and Sexual Activity   Alcohol use: No    Alcohol/week: 0.0 standard drinks of alcohol   Drug use: No   Sexual activity: Not Currently  Other Topics Concern   Not on file  Social History Narrative   Patient does not drink caffeine.   Patient is right handed.    Social Drivers of Corporate investment banker Strain: Low Risk  (05/18/2022)   [facility]   Overall Financial Resource Strain (CARDIA)    Difficulty of Paying Living Expenses: Not hard at all  Food Insecurity: No Food Insecurity (05/24/2023)   Hunger Vital Sign    Worried About Running Out of Food in the Last Year: Never true    Ran Out of Food in the Last Year: Never true  Transportation Needs: No Transportation Needs (05/24/2023)   PRAPARE - Administrator, Civil Service (Medical): No    Lack of Transportation (Non-Medical): No  Physical Activity: Not on file  Stress: Not on file  Social Connections: Moderately Isolated (05/20/2023)   Social Connection and Isolation Panel [NHANES]    Frequency of Communication with Friends and Family: Once a week    Frequency of Social Gatherings with Friends and Family: Once a week    Attends Religious Services: 1 to 4 times per year    Active Member of [employer] or Organizations: No    Attends Engineer, structural: Never    Marital Status: Married    Family History  Problem Relation Age of Onset   Congestive Heart Failure Mother    COPD Father    Cancer Brother    Cancer Brother    Breast cancer Neg Hx     Outpatient Encounter Medications as of 08/11/2023  Medication Sig   acetaminophen  (TYLENOL ) 325 MG tablet Take 2 tablets (650 mg total) by mouth every 4 (four) hours as needed for headache or mild pain.   albuterol  (VENTOLIN  HFA) 108 (90 Base) MCG/ACT inhaler Inhale 1-2 puffs into the lungs every 4 (four) hours as needed for  shortness of breath or wheezing.   ALPRAZolam  (XANAX ) 0.5 MG tablet Take 0.25 mg by mouth 2 (two) times daily.   atorvastatin  (LIPITOR ) 40 MG tablet Take 40 mg  by mouth daily.   Budeson-Glycopyrrol-Formoterol  (BREZTRI AEROSPHERE) 160-9-4.8 MCG/ACT AERO Inhale into the lungs. 1-2 times per day   Cholecalciferol (VITAMIN D3) 125 MCG (5000 UT) CAPS Take 5,000 Units by mouth once a week. Tuesday   clopidogrel  (PLAVIX ) 75 MG tablet Take 1 tablet (75 mg total) by mouth daily.   escitalopram  (LEXAPRO ) 20 MG tablet Take 20 mg by mouth daily.   gabapentin  (NEURONTIN ) 300 MG capsule Take 300 mg by mouth every other day. At night   glucose blood (ACCU-CHEK GUIDE) test strip Use as instructed   Insulin  Pen Needle (PEN NEEDLES) 31G X 6 MM MISC Use to inject insulin  once daily   LOKELMA  10 g PACK packet Take 10 g by mouth See admin instructions. Five times a week   nystatin cream (MYCOSTATIN) Apply 1 application  topically 2 (two) times daily as needed (Itching).   ondansetron  (ZOFRAN ) 4 MG tablet Take 1 tablet (4 mg total) by mouth every 6 (six) hours as needed for nausea or vomiting.   oxyCODONE -acetaminophen  (PERCOCET/ROXICET) 5-325 MG tablet Take 1 tablet by mouth every 6 (six) hours as needed for severe pain or moderate pain.   pantoprazole  (PROTONIX ) 40 MG tablet Take 1 tablet (40 mg total) by mouth daily.   sodium bicarbonate  650 MG tablet Take 650 mg by mouth 2 (two) times daily.   [DISCONTINUED] insulin  degludec (TRESIBA  FLEXTOUCH) 100 UNIT/ML FlexTouch Pen Inject 10 Units into the skin at bedtime.   guaiFENesin -dextromethorphan  (ROBITUSSIN DM) 100-10 MG/5ML syrup Take 10 mLs by mouth every 8 (eight) hours. (Patient not taking: Reported on 08/11/2023)   insulin  degludec (TRESIBA  FLEXTOUCH) 100 UNIT/ML FlexTouch Pen Inject 14 Units into the skin at bedtime.   No facility-administered encounter medications on file as of 08/11/2023.    ALLERGIES: Allergies  Allergen Reactions   Codeine Shortness Of  Breath   Sulfa Antibiotics Other (See Comments)    Unknown    Latex Itching    VACCINATION STATUS: Immunization History  Administered Date(s) Administered   Influenza,inj,Quad PF,6+ Mos 11/29/2014   Pneumococcal Polysaccharide-23 11/29/2014    Diabetes She presents for her follow-up diabetic visit. She has type 2 diabetes mellitus. Onset time: She was diagnosed at approximate age of 50 years. Her disease course has been improving. There are no hypoglycemic associated symptoms. Pertinent negatives for hypoglycemia include no confusion, headaches, pallor or seizures. Associated symptoms include fatigue and foot paresthesias. Pertinent negatives for diabetes include no chest pain, no polydipsia, no polyphagia, no polyuria and no weight loss. There are no hypoglycemic complications. Symptoms are stable. Diabetic complications include a CVA, heart disease and nephropathy. Risk factors for coronary artery disease include dyslipidemia, diabetes mellitus, family history, hypertension, tobacco exposure, sedentary lifestyle and post-menopausal. Current diabetic treatment includes insulin  injections. She is compliant with treatment most of the time. Her weight is fluctuating minimally. She is following a generally healthy diet. When asked about meal planning, she reported none. She has not had a previous visit with a dietitian. She never participates in exercise. Her home blood glucose trend is decreasing steadily. Her overall blood glucose range is 140-180 mg/dl. (She presents today with her meter and logs showing slightly above target fasting glycemic profile.  Her POCT A1c today is 7.8%, improving drastically from last A1c of 14.6%.  Analysis of her meter shows 7-day average of 164 (7 readings); 14-day average of 152 (14 readings); 30-day average of 142 (29 readings), and 90-day average of 170 (101 readings). She denies any significant hypoglycemia since last visit.)  An ACE inhibitor/angiotensin II receptor  blocker is being taken. She does not see a podiatrist.Eye exam is not current.  Hyperlipidemia This is a chronic problem. The current episode started more than 1 year ago. The problem is controlled. Recent lipid tests were reviewed and are normal. Exacerbating diseases include chronic renal disease and diabetes. Factors aggravating her hyperlipidemia include beta blockers, fatty foods and smoking. Pertinent negatives include no chest pain, myalgias or shortness of breath. Current antihyperlipidemic treatment includes statins. The current treatment provides mild improvement of lipids. Compliance problems include adherence to diet and adherence to exercise.  Risk factors for coronary artery disease include diabetes mellitus, dyslipidemia, hypertension, a sedentary lifestyle, post-menopausal and family history.  Hypertension This is a chronic problem. The current episode started more than 1 year ago. The problem has been resolved since onset. The problem is controlled. Pertinent negatives include no chest pain, headaches, palpitations or shortness of breath. There are no associated agents to hypertension. Risk factors for coronary artery disease include dyslipidemia, diabetes mellitus, sedentary lifestyle, smoking/tobacco exposure and post-menopausal state. Past treatments include ACE inhibitors and beta blockers. The current treatment provides moderate improvement. Compliance problems include exercise and diet.  Hypertensive end-organ damage includes kidney disease, CAD/MI and CVA. Identifiable causes of hypertension include chronic renal disease.    Review of systems  Constitutional: + stable body weight,  current Body mass index is 23.56 kg/m. , no fatigue, no subjective hyperthermia, no subjective hypothermia Eyes: no blurry vision, no xerophthalmia ENT: no sore throat, no nodules palpated in throat, no dysphagia/odynophagia, no hoarseness Cardiovascular: no chest pain, no shortness of breath, no  palpitations, no leg swelling Respiratory: no cough, no shortness of breath Gastrointestinal: no nausea/vomiting/diarrhea Musculoskeletal: no muscle/joint aches Skin: no rashes, no hyperemia Neurological: no tremors, no dizziness, + numbness/tingling to BLE (feet) Psychiatric: no depression, no anxiety  Objective:    BP 136/72 (BP Location: Right Arm, Patient Position: Sitting, Cuff Size: Large)   Pulse 82   Ht 5\' 5"  (1.651 m)   Wt 141 lb 9.6 oz (64.2 kg)   BMI 23.56 kg/m   Wt Readings from Last 3 Encounters:  08/11/23 141 lb 9.6 oz (64.2 kg)  07/27/23 138 lb 7.2 oz (62.8 kg)  06/06/23 138 lb 6.4 oz (62.8 kg)    BP Readings from Last 3 Encounters:  08/11/23 136/72  07/27/23 (!) 104/57  06/06/23 126/68      Physical Exam- Limited  Constitutional:  Body mass index is 23.56 kg/m. , not in acute distress, normal state of mind Eyes:  EOMI, no exophthalmos Musculoskeletal: no gross deformities, strength intact in all four extremities, no gross restriction of joint movements Skin:  no rashes, no hyperemia Neurological: no tremor with outstretched hands  Diabetic Foot Exam - Simple   Simple Foot Form Diabetic Foot exam was performed with the following findings: Yes 08/11/2023 11:37 AM  Visual Inspection No deformities, no ulcerations, no other skin breakdown bilaterally: Yes Sensation Testing Intact to touch and monofilament testing bilaterally: Yes Pulse Check Posterior Tibialis and Dorsalis pulse intact bilaterally: Yes Comments    CMP     Component Value Date/Time   NA 135 07/18/2023 1212   NA 141 09/28/2021 0822   K 5.2 (H) 07/18/2023 1212   CL 103 07/18/2023 1212   CO2 19 (L) 07/18/2023 1212   GLUCOSE 106 (H) 07/18/2023 1212   BUN 51 (H) 07/18/2023 1212   BUN 30 (H) 09/28/2021 0822   CREATININE 2.93 (H) 07/18/2023 1212  CREATININE 1.38 (H) 08/20/2019 0719   CALCIUM  9.1 07/18/2023 1212   PROT 7.1 07/18/2023 1212   PROT 6.6 09/28/2021 0822   ALBUMIN  3.7  07/18/2023 1212   ALBUMIN  4.2 09/28/2021 0822   AST 15 07/18/2023 1212   ALT 15 07/18/2023 1212   ALKPHOS 63 07/18/2023 1212   BILITOT 0.4 07/18/2023 1212   BILITOT 0.3 09/28/2021 0822   GFRNONAA 17 (L) 07/18/2023 1212   GFRNONAA 40 (L) 08/20/2019 0719   GFRAA 51 (L) 03/26/2020 0927   GFRAA 47 (L) 08/20/2019 0719     Diabetic Labs (most recent): Lab Results  Component Value Date   HGBA1C 7.8 (A) 08/11/2023   HGBA1C 14.6 (A) 05/10/2023   HGBA1C 7.7 (A) 01/28/2023   MICROALBUR 26.7 08/20/2019    Lipid Panel     Component Value Date/Time   CHOL 168 05/25/2023 0413   CHOL 161 03/30/2021 0803   TRIG 270 (H) 05/25/2023 0413   HDL 38 (L) 05/25/2023 0413   HDL 35 (L) 03/30/2021 0803   CHOLHDL 4.4 05/25/2023 0413   VLDL 54 (H) 05/25/2023 0413   LDLCALC 76 05/25/2023 0413   LDLCALC 90 03/30/2021 0803   LDLCALC 69 01/16/2019 0937   LABVLDL 36 03/30/2021 0803       Assessment & Plan:   1) DM type 2 causing vascular disease (HCC)  - SAMORA JERNBERG has currently uncontrolled symptomatic type 2 DM since  68 years of age.  She presents today with her meter and logs showing slightly above target fasting glycemic profile.  Her POCT A1c today is 7.8%, improving drastically from last A1c of 14.6%.  Analysis of her meter shows 7-day average of 164 (7 readings); 14-day average of 152 (14 readings); 30-day average of 142 (29 readings), and 90-day average of 170 (101 readings). She denies any significant hypoglycemia since last visit.  -Recent labs reviewed showing worsening kidney function- will refer to nephrology for further evaluation and treatment.  - I had a long discussion with her about the progressive nature of diabetes and the pathology behind its complications. -her diabetes is complicated by coronary artery disease, CVA, nephropathy, peripheral neuropathy, chronic heavy smoking, sedentary life and she remains at a high risk for more acute and chronic complications which  include CAD, CVA, CKD, retinopathy, and neuropathy. These are all discussed in detail with her.  - Nutritional counseling repeated at each appointment due to patients tendency to fall back in to old habits.  - The patient admits there is a room for improvement in their diet and drink choices. -  Suggestion is made for the patient to avoid simple carbohydrates from their diet including Cakes, Sweet Desserts / Pastries, Ice Cream, Soda (diet and regular), Sweet Tea, Candies, Chips, Cookies, Sweet Pastries, Store Bought Juices, Alcohol in Excess of 1-2 drinks a day, Artificial Sweeteners, Coffee Creamer, and "Sugar-free" Products. This will help patient to have stable blood glucose profile and potentially avoid unintended weight gain.   - I encouraged the patient to switch to unprocessed or minimally processed complex starch and increased protein intake (animal or plant source), fruits, and vegetables.   - Patient is advised to stick to a routine mealtimes to eat 3 meals a day and avoid unnecessary snacks (to snack only to correct hypoglycemia).  - I have approached her with the following individualized plan to manage  her diabetes and patient agrees:   -She is advised to adjust her Tresiba  to 14 units SQ nightly.    She  is advised to restart monitoring glucose twice daily, before breakfast and before bed, and to call the clinic if she has readings less than 70 or above 200 for 3 tests in a row. She did not care for the Dexcom.  She is not a candidate for Metformin  due to kidney disease, she does not meet criteria for GLP1 use with BMI less than 25.  She was on Glipizide  in the past but had severe hypoglycemia.  - Specific targets for  A1c;  LDL, HDL, Triglycerides, and  Waist Circumference were discussed with the patient.  2) Blood Pressure /Hypertension:  Her blood pressure is tight, recently taken off all BP meds.    3) Lipids/Hyperlipidemia:   Her most recent lipid panel from 03/30/21 shows  controlled LDL of 90 and elevated triglycerides of 212.  She is advised to continue Crestor  20 mg po daily at bedtime.  Side effects and precautions discussed with her.  She is advised to avoid fried foods and butter.    4)  Weight/Diet:  Her Body mass index is 23.56 kg/m..  she is not a candidate for more weight loss.   Exercise, and detailed carbohydrates information provided  -  detailed on discharge instructions.  5) Chronic Care/Health Maintenance: -she is on ACEI/ARB and Statin medications and is encouraged to initiate and continue to follow up with Ophthalmology, Dentist, Podiatrist at least yearly or according to recommendations, and advised to quit smoking. I have recommended yearly flu vaccine and pneumonia vaccine at least every 5 years; moderate intensity exercise for up to 150 minutes weekly; and sleep for at least 7 hours a day.  - she is advised to maintain close follow up with Grace Parkins, MD for primary care needs (will be calling him about her fatigue to have iron checked), as well as her other providers for optimal and coordinated care (also reaching out to surgeon about the mass that has appeared.       I spent  24  minutes in the care of the patient today including review of labs from CMP, Lipids, Thyroid  Function, Hematology (current and previous including abstractions from other facilities); face-to-face time discussing  her blood glucose readings/logs, discussing hypoglycemia and hyperglycemia episodes and symptoms, medications doses, her options of short and long term treatment based on the latest standards of care / guidelines;  discussion about incorporating lifestyle medicine;  and documenting the encounter. Risk reduction counseling performed per USPSTF guidelines to reduce obesity and cardiovascular risk factors.     Please refer to Patient Instructions for Blood Glucose Monitoring and Insulin /Medications Dosing Guide"  in media tab for additional information. Please   also refer to " Patient Self Inventory" in the Media  tab for reviewed elements of pertinent patient history.  Walterine Gunther participated in the discussions, expressed understanding, and voiced agreement with the above plans.  All questions were answered to her satisfaction. she is encouraged to contact clinic should she have any questions or concerns prior to her return visit.   Follow up plan: - Return in about 4 months (around 12/12/2023) for Diabetes F/U with A1c in office, No previsit labs, Bring meter and logs.  Hulon Magic, [facility] [address] [place], [place] [address] Phone: [phone] Fax: [phone]  08/11/2023, 11:42 AM

## 2023-08-18 ENCOUNTER — Other Ambulatory Visit: Payer: Self-pay | Admitting: Cardiology

## 2023-08-18 DIAGNOSIS — K219 Gastro-esophageal reflux disease without esophagitis: Secondary | ICD-10-CM | POA: Diagnosis not present

## 2023-08-18 DIAGNOSIS — N184 Chronic kidney disease, stage 4 (severe): Secondary | ICD-10-CM | POA: Diagnosis not present

## 2023-08-18 DIAGNOSIS — I10 Essential (primary) hypertension: Secondary | ICD-10-CM | POA: Diagnosis not present

## 2023-08-18 DIAGNOSIS — Z6824 Body mass index (BMI) 24.0-24.9, adult: Secondary | ICD-10-CM | POA: Diagnosis not present

## 2023-08-18 DIAGNOSIS — I739 Peripheral vascular disease, unspecified: Secondary | ICD-10-CM | POA: Diagnosis not present

## 2023-08-18 DIAGNOSIS — F411 Generalized anxiety disorder: Secondary | ICD-10-CM | POA: Diagnosis not present

## 2023-08-18 DIAGNOSIS — M1991 Primary osteoarthritis, unspecified site: Secondary | ICD-10-CM | POA: Diagnosis not present

## 2023-09-20 DIAGNOSIS — G894 Chronic pain syndrome: Secondary | ICD-10-CM | POA: Diagnosis not present

## 2023-09-20 DIAGNOSIS — N184 Chronic kidney disease, stage 4 (severe): Secondary | ICD-10-CM | POA: Diagnosis not present

## 2023-09-20 DIAGNOSIS — I10 Essential (primary) hypertension: Secondary | ICD-10-CM | POA: Diagnosis not present

## 2023-09-20 DIAGNOSIS — I739 Peripheral vascular disease, unspecified: Secondary | ICD-10-CM | POA: Diagnosis not present

## 2023-09-20 DIAGNOSIS — M1991 Primary osteoarthritis, unspecified site: Secondary | ICD-10-CM | POA: Diagnosis not present

## 2023-09-20 DIAGNOSIS — E1165 Type 2 diabetes mellitus with hyperglycemia: Secondary | ICD-10-CM | POA: Diagnosis not present

## 2023-09-20 DIAGNOSIS — Z6824 Body mass index (BMI) 24.0-24.9, adult: Secondary | ICD-10-CM | POA: Diagnosis not present

## 2023-09-21 ENCOUNTER — Other Ambulatory Visit: Payer: Self-pay | Admitting: Physician Assistant

## 2023-10-05 DIAGNOSIS — I739 Peripheral vascular disease, unspecified: Secondary | ICD-10-CM | POA: Diagnosis not present

## 2023-10-05 DIAGNOSIS — Z9229 Personal history of other drug therapy: Secondary | ICD-10-CM | POA: Diagnosis not present

## 2023-10-05 DIAGNOSIS — N184 Chronic kidney disease, stage 4 (severe): Secondary | ICD-10-CM | POA: Diagnosis not present

## 2023-10-05 DIAGNOSIS — Z0001 Encounter for general adult medical examination with abnormal findings: Secondary | ICD-10-CM | POA: Diagnosis not present

## 2023-10-05 DIAGNOSIS — M1991 Primary osteoarthritis, unspecified site: Secondary | ICD-10-CM | POA: Diagnosis not present

## 2023-10-05 DIAGNOSIS — E1165 Type 2 diabetes mellitus with hyperglycemia: Secondary | ICD-10-CM | POA: Diagnosis not present

## 2023-10-05 DIAGNOSIS — E119 Type 2 diabetes mellitus without complications: Secondary | ICD-10-CM | POA: Diagnosis not present

## 2023-10-05 DIAGNOSIS — Z1331 Encounter for screening for depression: Secondary | ICD-10-CM | POA: Diagnosis not present

## 2023-10-05 DIAGNOSIS — D518 Other vitamin B12 deficiency anemias: Secondary | ICD-10-CM | POA: Diagnosis not present

## 2023-10-05 DIAGNOSIS — Z6824 Body mass index (BMI) 24.0-24.9, adult: Secondary | ICD-10-CM | POA: Diagnosis not present

## 2023-10-05 DIAGNOSIS — G894 Chronic pain syndrome: Secondary | ICD-10-CM | POA: Diagnosis not present

## 2023-10-05 DIAGNOSIS — I10 Essential (primary) hypertension: Secondary | ICD-10-CM | POA: Diagnosis not present

## 2023-10-05 DIAGNOSIS — F411 Generalized anxiety disorder: Secondary | ICD-10-CM | POA: Diagnosis not present

## 2023-10-05 LAB — COMPREHENSIVE METABOLIC PANEL WITH GFR: eGFR: 16

## 2023-10-05 LAB — LIPID PANEL
LDL Cholesterol: 93
Triglycerides: 300 — AB (ref 40–160)

## 2023-10-05 LAB — BASIC METABOLIC PANEL WITH GFR
BUN: 46 — AB (ref 4–21)
Creatinine: 3.1 — AB (ref 0.5–1.1)
Glucose: 213

## 2023-10-05 LAB — TSH: TSH: 0.95 (ref 0.41–5.90)

## 2023-10-05 LAB — HEMOGLOBIN A1C: Hemoglobin A1C: 8.2

## 2023-10-05 LAB — MICROALBUMIN / CREATININE URINE RATIO: Microalb Creat Ratio: 1215

## 2023-10-05 LAB — VITAMIN D 25 HYDROXY (VIT D DEFICIENCY, FRACTURES): Vit D, 25-Hydroxy: 23.1

## 2023-10-11 ENCOUNTER — Ambulatory Visit (INDEPENDENT_AMBULATORY_CARE_PROVIDER_SITE_OTHER): Admitting: Nurse Practitioner

## 2023-10-11 ENCOUNTER — Encounter: Payer: Self-pay | Admitting: Nurse Practitioner

## 2023-10-11 VITALS — BP 108/68 | HR 66 | Ht 65.0 in | Wt 141.2 lb

## 2023-10-11 DIAGNOSIS — I10 Essential (primary) hypertension: Secondary | ICD-10-CM | POA: Diagnosis not present

## 2023-10-11 DIAGNOSIS — E559 Vitamin D deficiency, unspecified: Secondary | ICD-10-CM | POA: Diagnosis not present

## 2023-10-11 DIAGNOSIS — E1159 Type 2 diabetes mellitus with other circulatory complications: Secondary | ICD-10-CM

## 2023-10-11 DIAGNOSIS — Z794 Long term (current) use of insulin: Secondary | ICD-10-CM

## 2023-10-11 DIAGNOSIS — E782 Mixed hyperlipidemia: Secondary | ICD-10-CM | POA: Diagnosis not present

## 2023-10-11 MED ORDER — TRESIBA FLEXTOUCH 100 UNIT/ML ~~LOC~~ SOPN: 20.0000 [IU] | PEN_INJECTOR | Freq: Every day | SUBCUTANEOUS | 1 refills | Status: DC

## 2023-10-11 NOTE — Progress Notes (Signed)
 10/11/2023, 1:51 PM                                Endocrinology follow-up note   Subjective:    Patient ID: Grace Hess, female    DOB: 1955/12/22.  Grace Hess is being seen in follow-up for management of currently uncontrolled symptomatic diabetes requested by  Grace Satterfield, MD.   Past Medical History:  Diagnosis Date   Anxiety    CAD (coronary artery disease)    a. s/p DES to RCA in 07/2016 with residual mid-LAD stenosis   Cancer (HCC)    skin cancer on finger,, cancer left kidney   Cervicalgia    Chest pain, unspecified    Chronic kidney disease    Depression    GERD (gastroesophageal reflux disease)    Headache    bad ones after my stroke in 2016; only have them when I get bad news now (08/02/2016)   Heart murmur    Heavy smoker (more than 20 cigarettes per day)    Scheduled for LDCT screening 02/11/15   History of hiatal hernia    History of kidney stones    no cysto/OR (12/10/2014)   Hypercholesteremia    Hypertension    Incarcerated incisional hernia 08/05/2022   Left renal mass    Obesity    Palpitations    Reflux esophagitis    Stroke [facility])    they said I had a  MINI stroke a few months back/MRI (12/10/2014)   TIA (transient ischemic attack) 11/28/2014   thelbert 11/28/2014   Type II diabetes mellitus (HCC)     Past Surgical History:  Procedure Laterality Date   ABDOMINAL AORTOGRAM N/A 08/02/2016   Procedure: Abdominal Aortogram;  Surgeon: Grace Bruckner, MD;  Location: [facility];  Service: Cardiovascular;  Laterality: N/A;   CAROTID STENT     CHOLECYSTECTOMY OPEN  1978   COLONOSCOPY  2010   single diverticulum in sigmoid colon   CORONARY ANGIOPLASTY WITH STENT PLACEMENT  08/02/2016   to RCA   CORONARY STENT INTERVENTION N/A 08/02/2016   Procedure: Coronary Stent Intervention;  Surgeon: Grace Bruckner, MD;  Location: [facility];   Service: Cardiovascular;  Laterality: N/A;  RCA   CYSTOSCOPY W/ RETROGRADES Bilateral 07/15/2021   Procedure: CYSTOSCOPY WITH RETROGRADE PYELOGRAM;  Surgeon: Grace Adine PARAS., MD;  Location: [facility];  Service: Urology;  Laterality: Bilateral;   CYSTOSCOPY WITH STENT PLACEMENT Left 07/15/2021   Procedure: CYSTOSCOPY WITH STENT PLACEMENT;  Surgeon: Grace Adine PARAS., MD;  Location: [facility];  Service: Urology;  Laterality: Left;   ENDARTERECTOMY Left 12/02/2014   Procedure: ENDARTERECTOMY CAROTID;  Surgeon: Grace JULIANNA Doing, MD;  Location: [facility] OR;  Service: Vascular;  Laterality: Left;   HERNIA REPAIR     LAPAROSCOPIC NEPHRECTOMY Left    LAPAROSCOPIC NEPHRECTOMY Left 11/25/2021   Procedure: LEFT LAPAROSCOPIC RADICAL NEPHRECTOMY;  Surgeon: Grace Morene ORN, MD;  Location: [facility] ORS;  Service: Urology;  Laterality: Left;   LEFT HEART CATH AND CORONARY ANGIOGRAPHY N/A 08/02/2016  Procedure: Left Heart Cath and Coronary Angiography;  Surgeon: Grace Bruckner, MD;  Location: [facility];  Service: Cardiovascular;  Laterality: N/A;   LOWER EXTREMITY ANGIOGRAM  08/02/2016   LOWER EXTREMITY ANGIOGRAPHY N/A 08/02/2016   Procedure: Lower Extremity Angiography;  Surgeon: Grace Bruckner, MD;  Location: [facility];  Service: Cardiovascular;  Laterality: N/A;   NEPHRECTOMY Left    VAGINAL HYSTERECTOMY  1991   partial   VENTRAL HERNIA REPAIR N/A 08/05/2022   Procedure: LAPAROSCOPIC VENTRAL WALL HERNIA REPAIR WITH MESH;  Surgeon: Grace Standing, MD;  Location: [facility] OR[facility];  Service: General;  Laterality: N/A;    Social History   Socioeconomic History   Marital status: Married    Spouse name: Not on file   Number of children: 2   Years of education: 12   Highest education level: Not on file  Occupational History   Not on file  Tobacco Use   Smoking status: Every Day    Current packs/day: 1.50    Average packs/day: 1.5 packs/day for 49.4 years (74.1 ttl pk-yrs)    Types: Cigarettes     Start date: 05/17/1974   Smokeless tobacco: Never  Vaping Use   Vaping status: Never Used  Substance and Sexual Activity   Alcohol use: No    Alcohol/week: 0.0 standard drinks of alcohol   Drug use: No   Sexual activity: Not Currently  Other Topics Concern   Not on file  Social History Narrative   Patient does not drink caffeine.   Patient is right handed.    Social Drivers of Corporate investment banker Strain: Low Risk  (05/18/2022)   Received from [facility]   Overall Financial Resource Strain (CARDIA)    Difficulty of Paying Living Expenses: Not hard at all  Food Insecurity: No Food Insecurity (05/24/2023)   Hunger Vital Sign    Worried About Running Out of Food in the Last Year: Never true    Ran Out of Food in the Last Year: Never true  Transportation Needs: No Transportation Needs (05/24/2023)   PRAPARE - Administrator, Civil Service (Medical): No    Lack of Transportation (Non-Medical): No  Physical Activity: Not on file  Stress: Not on file  Social Connections: Moderately Isolated (05/20/2023)   Social Connection and Isolation Panel    Frequency of Communication with Friends and Family: Once a week    Frequency of Social Gatherings with Friends and Family: Once a week    Attends Religious Services: 1 to 4 times per year    Active Member of [employer] or Organizations: No    Attends Engineer, structural: Never    Marital Status: Married    Family History  Problem Relation Age of Onset   Congestive Heart Failure Mother    COPD Father    Cancer Brother    Cancer Brother    Breast cancer Neg Hx     Outpatient Encounter Medications as of 10/11/2023  Medication Sig   acetaminophen  (TYLENOL ) 325 MG tablet Take 2 tablets (650 mg total) by mouth every 4 (four) hours as needed for headache or mild pain.   albuterol  (VENTOLIN  HFA) 108 (90 Base) MCG/ACT inhaler Inhale 1-2 puffs into the lungs every 4 (four) hours as needed for shortness of breath or  wheezing.   ALPRAZolam  (XANAX ) 0.5 MG tablet Take 0.25 mg by mouth 2 (two) times daily.   atorvastatin  (LIPITOR ) 40 MG tablet TAKE 1 TABLET(40 MG) BY MOUTH  DAILY   Budeson-Glycopyrrol-Formoterol  (BREZTRI AEROSPHERE) 160-9-4.8 MCG/ACT AERO Inhale into the lungs. 1-2 times per day   Cholecalciferol (VITAMIN D3) 125 MCG (5000 UT) CAPS Take 5,000 Units by mouth once a week. Tuesday   clopidogrel  (PLAVIX ) 75 MG tablet Take 1 tablet (75 mg total) by mouth daily.   escitalopram  (LEXAPRO ) 20 MG tablet Take 20 mg by mouth daily.   gabapentin  (NEURONTIN ) 300 MG capsule Take 300 mg by mouth every other day. At night   glucose blood (ACCU-CHEK GUIDE) test strip Use as instructed   Insulin  Pen Needle (PEN NEEDLES) 31G X 6 MM MISC Use to inject insulin  once daily   LOKELMA  10 g PACK packet Take 10 g by mouth See admin instructions. Five times a week   nystatin cream (MYCOSTATIN) Apply 1 application  topically 2 (two) times daily as needed (Itching).   ondansetron  (ZOFRAN ) 4 MG tablet Take 1 tablet (4 mg total) by mouth every 6 (six) hours as needed for nausea or vomiting.   oxyCODONE -acetaminophen  (PERCOCET/ROXICET) 5-325 MG tablet Take 1 tablet by mouth every 6 (six) hours as needed for severe pain or moderate pain.   pantoprazole  (PROTONIX ) 40 MG tablet Take 1 tablet (40 mg total) by mouth daily.   sodium bicarbonate  650 MG tablet Take 650 mg by mouth 2 (two) times daily.   [DISCONTINUED] insulin  degludec (TRESIBA  FLEXTOUCH) 100 UNIT/ML FlexTouch Pen Inject 14 Units into the skin at bedtime.   guaiFENesin -dextromethorphan  (ROBITUSSIN DM) 100-10 MG/5ML syrup Take 10 mLs by mouth every 8 (eight) hours. (Patient not taking: Reported on 10/11/2023)   insulin  degludec (TRESIBA  FLEXTOUCH) 100 UNIT/ML FlexTouch Pen Inject 20 Units into the skin at bedtime.   No facility-administered encounter medications on file as of 10/11/2023.    ALLERGIES: Allergies  Allergen Reactions   Codeine Shortness Of Breath   Sulfa  Antibiotics Other (See Comments)    Unknown    Latex Itching    VACCINATION STATUS: Immunization History  Administered Date(s) Administered   Influenza,inj,Quad PF,6+ Mos 11/29/2014   Pneumococcal Polysaccharide-23 11/29/2014    Diabetes She presents for her follow-up diabetic visit. She has type 2 diabetes mellitus. Onset time: She was diagnosed at approximate age of 50 years. Her disease course has been stable. There are no hypoglycemic associated symptoms. Pertinent negatives for hypoglycemia include no confusion, headaches, pallor or seizures. Associated symptoms include fatigue and foot paresthesias. Pertinent negatives for diabetes include no chest pain, no polydipsia, no polyphagia, no polyuria and no weight loss. There are no hypoglycemic complications. Symptoms are stable. Diabetic complications include a CVA, heart disease and nephropathy. Risk factors for coronary artery disease include dyslipidemia, diabetes mellitus, family history, hypertension, tobacco exposure, sedentary lifestyle and post-menopausal. Current diabetic treatment includes insulin  injections. She is compliant with treatment most of the time. Her weight is fluctuating minimally. She is following a generally healthy diet. When asked about meal planning, she reported none. She has not had a previous visit with a dietitian. She never participates in exercise. Her home blood glucose trend is fluctuating minimally. Her breakfast blood glucose range is generally 140-180 mg/dl. Her overall blood glucose range is 140-180 mg/dl. (She presents today with her meter and logs showing slightly above target fasting glycemic profile.  Her most recent A1c on 7/23 by her PCP was 8.2%, increasing from last visit of 7.8%.  Analysis of her meter shows 7-day average of 148; 14-day average of 156; 30-day average of 154, and 90-day average of 147. She denies any significant hypoglycemia  since last visit.) An ACE inhibitor/angiotensin II receptor  blocker is being taken. She does not see a podiatrist.Eye exam is not current.  Hyperlipidemia This is a chronic problem. The current episode started more than 1 year ago. The problem is controlled. Recent lipid tests were reviewed and are normal. Exacerbating diseases include chronic renal disease and diabetes. Factors aggravating her hyperlipidemia include beta blockers, fatty foods and smoking. Pertinent negatives include no chest pain, myalgias or shortness of breath. Current antihyperlipidemic treatment includes statins. The current treatment provides mild improvement of lipids. Compliance problems include adherence to diet and adherence to exercise.  Risk factors for coronary artery disease include diabetes mellitus, dyslipidemia, hypertension, a sedentary lifestyle, post-menopausal and family history.  Hypertension This is a chronic problem. The current episode started more than 1 year ago. The problem has been resolved since onset. The problem is controlled. Pertinent negatives include no chest pain, headaches, palpitations or shortness of breath. There are no associated agents to hypertension. Risk factors for coronary artery disease include dyslipidemia, diabetes mellitus, sedentary lifestyle, smoking/tobacco exposure and post-menopausal state. Past treatments include ACE inhibitors and beta blockers. The current treatment provides moderate improvement. Compliance problems include exercise and diet.  Hypertensive end-organ damage includes kidney disease, CAD/MI and CVA. Identifiable causes of hypertension include chronic renal disease.    Review of systems  Constitutional: + stable body weight,  current Body mass index is 23.5 kg/m. , no fatigue, no subjective hyperthermia, no subjective hypothermia Eyes: no blurry vision, no xerophthalmia ENT: no sore throat, no nodules palpated in throat, no dysphagia/odynophagia, no hoarseness Cardiovascular: no chest pain, no shortness of breath, no  palpitations, no leg swelling Respiratory: no cough, no shortness of breath Gastrointestinal: no nausea/vomiting/diarrhea Musculoskeletal: no muscle/joint aches Skin: no rashes, no hyperemia Neurological: no tremors, no dizziness, + numbness/tingling to BLE (feet) Psychiatric: no depression, no anxiety  Objective:    BP 108/68 (BP Location: Right Arm, Patient Position: Sitting, Cuff Size: Large)   Pulse 66   Ht 5' 5 (1.651 m)   Wt 141 lb 3.2 oz (64 kg)   BMI 23.50 kg/m   Wt Readings from Last 3 Encounters:  10/11/23 141 lb 3.2 oz (64 kg)  08/11/23 141 lb 9.6 oz (64.2 kg)  07/27/23 138 lb 7.2 oz (62.8 kg)    BP Readings from Last 3 Encounters:  10/11/23 108/68  08/11/23 136/72  07/27/23 (!) 104/57      Physical Exam- Limited  Constitutional:  Body mass index is 23.5 kg/m. , not in acute distress, normal state of mind Eyes:  EOMI, no exophthalmos Musculoskeletal: no gross deformities, strength intact in all four extremities, no gross restriction of joint movements Skin:  no rashes, no hyperemia Neurological: no tremor with outstretched hands  Diabetic Foot Exam - Simple   No data filed    CMP     Component Value Date/Time   NA 135 07/18/2023 1212   NA 141 09/28/2021 0822   K 5.2 (H) 07/18/2023 1212   CL 103 07/18/2023 1212   CO2 19 (L) 07/18/2023 1212   GLUCOSE 106 (H) 07/18/2023 1212   BUN 46 (A) 10/05/2023 0000   CREATININE 3.1 (A) 10/05/2023 0000   CREATININE 2.93 (H) 07/18/2023 1212   CREATININE 1.38 (H) 08/20/2019 0719   CALCIUM  9.1 07/18/2023 1212   PROT 7.1 07/18/2023 1212   PROT 6.6 09/28/2021 0822   ALBUMIN  3.7 07/18/2023 1212   ALBUMIN  4.2 09/28/2021 0822   AST 15 07/18/2023 1212   ALT  15 07/18/2023 1212   ALKPHOS 63 07/18/2023 1212   BILITOT 0.4 07/18/2023 1212   BILITOT 0.3 09/28/2021 0822   GFRNONAA 17 (L) 07/18/2023 1212   GFRNONAA 40 (L) 08/20/2019 0719   GFRAA 51 (L) 03/26/2020 0927   GFRAA 47 (L) 08/20/2019 0719     Diabetic Labs  (most recent): Lab Results  Component Value Date   HGBA1C 8.2 10/05/2023   HGBA1C 7.8 (A) 08/11/2023   HGBA1C 14.6 (A) 05/10/2023   MICROALBUR 26.7 08/20/2019    Lipid Panel     Component Value Date/Time   CHOL 168 05/25/2023 0413   CHOL 161 03/30/2021 0803   TRIG 300 (A) 10/05/2023 0000   HDL 38 (L) 05/25/2023 0413   HDL 35 (L) 03/30/2021 0803   CHOLHDL 4.4 05/25/2023 0413   VLDL 54 (H) 05/25/2023 0413   LDLCALC 93 10/05/2023 0000   LDLCALC 90 03/30/2021 0803   LDLCALC 69 01/16/2019 0937   LABVLDL 36 03/30/2021 0803       Assessment & Plan:   1) DM type 2 causing vascular disease (HCC)  - Grace Hess has currently uncontrolled symptomatic type 2 DM since  68 years of age.  She presents today with her meter and logs showing slightly above target fasting glycemic profile.  Her most recent A1c on 7/23 by her PCP was 8.2%, increasing from last visit of 7.8%.  Analysis of her meter shows 7-day average of 148; 14-day average of 156; 30-day average of 154, and 90-day average of 147. She denies any significant hypoglycemia since last visit.  -Recent labs reviewed showing worsening kidney function- will refer to nephrology for further evaluation and treatment.  - I had a long discussion with her about the progressive nature of diabetes and the pathology behind its complications. -her diabetes is complicated by coronary artery disease, CVA, nephropathy, peripheral neuropathy, chronic heavy smoking, sedentary life and she remains at a high risk for more acute and chronic complications which include CAD, CVA, CKD, retinopathy, and neuropathy. These are all discussed in detail with her.  - Nutritional counseling repeated at each appointment due to patients tendency to fall back in to old habits.  - The patient admits there is a room for improvement in their diet and drink choices. -  Suggestion is made for the patient to avoid simple carbohydrates from their diet including Cakes,  Sweet Desserts / Pastries, Ice Cream, Soda (diet and regular), Sweet Tea, Candies, Chips, Cookies, Sweet Pastries, Store Bought Juices, Alcohol in Excess of 1-2 drinks a day, Artificial Sweeteners, Coffee Creamer, and Sugar-free Products. This will help patient to have stable blood glucose profile and potentially avoid unintended weight gain.   - I encouraged the patient to switch to unprocessed or minimally processed complex starch and increased protein intake (animal or plant source), fruits, and vegetables.   - Patient is advised to stick to a routine mealtimes to eat 3 meals a day and avoid unnecessary snacks (to snack only to correct hypoglycemia).  - I have approached her with the following individualized plan to manage  her diabetes and patient agrees:   -She is advised to adjust her Tresiba  to 20 units SQ nightly to try and get fasting readings between 90-130 consistently.  She is advised to restart monitoring glucose twice daily, before breakfast and before bed, and to call the clinic if she has readings less than 70 or above 200 for 3 tests in a row. She did not care for the Dexcom.  She is not a candidate for Metformin  due to kidney disease, she does not meet criteria for GLP1 use with BMI less than 25.  She was on Glipizide  in the past but had severe hypoglycemia.  - Specific targets for  A1c;  LDL, HDL, Triglycerides, and  Waist Circumference were discussed with the patient.  2) Blood Pressure /Hypertension:  Her blood pressure is tight, recently taken off all BP meds.    3) Lipids/Hyperlipidemia:   Her most recent lipid panel from 10/05/23 shows controlled LDL of 93 and elevated triglycerides of 300.  She is advised to continue Crestor  20 mg po daily at bedtime.  Side effects and precautions discussed with her.  She is advised to avoid fried foods and butter.    4)  Weight/Diet:  Her Body mass index is 23.5 kg/m..  she is not a candidate for more weight loss.   Exercise, and  detailed carbohydrates information provided  -  detailed on discharge instructions.  5) Chronic Care/Health Maintenance: -she is on ACEI/ARB and Statin medications and is encouraged to initiate and continue to follow up with Ophthalmology, Dentist, Podiatrist at least yearly or according to recommendations, and advised to quit smoking. I have recommended yearly flu vaccine and pneumonia vaccine at least every 5 years; moderate intensity exercise for up to 150 minutes weekly; and sleep for at least 7 hours a day.  - she is advised to maintain close follow up with Grace Satterfield, MD for primary care needs.  She is also worried about her kidney function, has an appointment coming up next month with nephrology to discuss.      I spent  31  minutes in the care of the patient today including review of labs from CMP, Lipids, Thyroid  Function, Hematology (current and previous including abstractions from other facilities); face-to-face time discussing  her blood glucose readings/logs, discussing hypoglycemia and hyperglycemia episodes and symptoms, medications doses, her options of short and long term treatment based on the latest standards of care / guidelines;  discussion about incorporating lifestyle medicine;  and documenting the encounter. Risk reduction counseling performed per USPSTF guidelines to reduce obesity and cardiovascular risk factors.     Please refer to Patient Instructions for Blood Glucose Monitoring and Insulin /Medications Dosing Guide  in media tab for additional information. Please  also refer to  Patient Self Inventory in the Media  tab for reviewed elements of pertinent patient history.  Grace Hess participated in the discussions, expressed understanding, and voiced agreement with the above plans.  All questions were answered to her satisfaction. she is encouraged to contact clinic should she have any questions or concerns prior to her return visit.   Follow up plan: -  Return in about 3 months (around 01/11/2024) for Diabetes F/U with A1c in office, No previsit labs, Bring meter and logs.  Benton Rio, [facility] [address] [place], [place] [address] Phone: [phone] Fax: [phone]  10/11/2023, 1:51 PM

## 2023-10-18 ENCOUNTER — Other Ambulatory Visit (INDEPENDENT_AMBULATORY_CARE_PROVIDER_SITE_OTHER): Payer: Self-pay | Admitting: Gastroenterology

## 2023-11-01 DIAGNOSIS — I129 Hypertensive chronic kidney disease with stage 1 through stage 4 chronic kidney disease, or unspecified chronic kidney disease: Secondary | ICD-10-CM | POA: Diagnosis not present

## 2023-11-01 DIAGNOSIS — N2581 Secondary hyperparathyroidism of renal origin: Secondary | ICD-10-CM | POA: Diagnosis not present

## 2023-11-01 DIAGNOSIS — N184 Chronic kidney disease, stage 4 (severe): Secondary | ICD-10-CM | POA: Diagnosis not present

## 2023-11-01 DIAGNOSIS — E1122 Type 2 diabetes mellitus with diabetic chronic kidney disease: Secondary | ICD-10-CM | POA: Diagnosis not present

## 2023-11-08 DIAGNOSIS — T148XXA Other injury of unspecified body region, initial encounter: Secondary | ICD-10-CM | POA: Diagnosis not present

## 2023-11-08 DIAGNOSIS — Z85828 Personal history of other malignant neoplasm of skin: Secondary | ICD-10-CM | POA: Diagnosis not present

## 2023-11-08 DIAGNOSIS — C44311 Basal cell carcinoma of skin of nose: Secondary | ICD-10-CM | POA: Diagnosis not present

## 2023-11-08 DIAGNOSIS — Z08 Encounter for follow-up examination after completed treatment for malignant neoplasm: Secondary | ICD-10-CM | POA: Diagnosis not present

## 2023-11-28 NOTE — Progress Notes (Unsigned)
 [facility] [place]  ASSESSMENT / PLAN: Grace Hess is a 68 y.o. female with history of left carotid endarterectomy 12/02/14 with Dr. Oris for symptomatic stenosis. Right 40 to 59% carotid artery stenosis.  Recommend:  Abstinence from all tobacco products. Blood glucose control with goal A1c < 7%. Blood pressure control with goal blood pressure < 140/90 mmHg. Lipid reduction therapy with goal LDL-C <100 mg/dL  Aspirin  81mg  PO QD.  Atorvastatin  40-80mg  PO QD (or other high intensity statin therapy).  Stable carotid artery stenosis.  Follow-up with me in 1 year with repeat duplex  CHIEF COMPLAINT: Follow-up for cerebrovascular disease  HISTORY OF PRESENT ILLNESS: Grace Hess is a 68 y.o. female referred to clinic for worsening carotid artery stenosis. The patient is asymptomatic from a carotid artery standpoint.  She denies any amaurosis, facial droop, difficulty speaking, unilateral weakness or numbness.  The patient did undergo a left carotid endarterectomy in 2016 with Dr. Oris which she recovered from nicely.  We reviewed her most recent duplex.  I reviewed the rationale for asymptomatic intervention at 80%.  I reviewed the difficulty with a duplex reported as 70 to 99%.  05/31/2023.  Patient returns after follow-up duplex.  She was not able to get a CT angiogram of the head and neck because of her renal function.  I suggested a repeat carotid duplex in our lab to better identify if she was above or below the 80% threshold we typically use for asymptomatic intervention.  Thankfully she is below this.  She reports no focal neurologic deficits.  She is very thankful she does not need surgery.  11/29/2023.  Returns for surveillance of carotid artery stenosis.  This is doing very well overall.  Duplex today is stable.  The patient reports no stroke or TIA symptoms.   Past Medical History:  Diagnosis Date   Anxiety    CAD (coronary artery disease)    a.  s/p DES to RCA in 07/2016 with residual mid-LAD stenosis   Cancer (HCC)    skin cancer on finger,, cancer left kidney   Cervicalgia    Chest pain, unspecified    Chronic kidney disease    Depression    GERD (gastroesophageal reflux disease)    Headache    bad ones after my stroke in 2016; only have them when I get bad news now (08/02/2016)   Heart murmur    Heavy smoker (more than 20 cigarettes per day)    Scheduled for LDCT screening 02/11/15   History of hiatal hernia    History of kidney stones    no cysto/OR (12/10/2014)   Hypercholesteremia    Hypertension    Incarcerated incisional hernia 08/05/2022   Left renal mass    Obesity    Palpitations    Reflux esophagitis    Stroke [facility])    they said I had a  MINI stroke a few months back/MRI (12/10/2014)   TIA (transient ischemic attack) 11/28/2014   thelbert 11/28/2014   Type II diabetes mellitus (HCC)     Past Surgical History:  Procedure Laterality Date   ABDOMINAL AORTOGRAM N/A 08/02/2016   Procedure: Abdominal Aortogram;  Surgeon: Mady Bruckner, MD;  Location: [facility];  Service: Cardiovascular;  Laterality: N/A;   CAROTID STENT     CHOLECYSTECTOMY OPEN  1978   COLONOSCOPY  2010   single diverticulum in sigmoid colon   CORONARY ANGIOPLASTY WITH STENT PLACEMENT  08/02/2016   to RCA  CORONARY STENT INTERVENTION N/A 08/02/2016   Procedure: Coronary Stent Intervention;  Surgeon: Mady Bruckner, MD;  Location: [facility];  Service: Cardiovascular;  Laterality: N/A;  RCA   CYSTOSCOPY W/ RETROGRADES Bilateral 07/15/2021   Procedure: CYSTOSCOPY WITH RETROGRADE PYELOGRAM;  Surgeon: Roseann Adine PARAS., MD;  Location: [facility];  Service: Urology;  Laterality: Bilateral;   CYSTOSCOPY WITH STENT PLACEMENT Left 07/15/2021   Procedure: CYSTOSCOPY WITH STENT PLACEMENT;  Surgeon: Roseann Adine PARAS., MD;  Location: [facility];  Service: Urology;  Laterality: Left;   ENDARTERECTOMY Left 12/02/2014   Procedure:  ENDARTERECTOMY CAROTID;  Surgeon: Krystal JULIANNA Doing, MD;  Location: [facility] OR;  Service: Vascular;  Laterality: Left;   HERNIA REPAIR     LAPAROSCOPIC NEPHRECTOMY Left    LAPAROSCOPIC NEPHRECTOMY Left 11/25/2021   Procedure: LEFT LAPAROSCOPIC RADICAL NEPHRECTOMY;  Surgeon: Cam Morene ORN, MD;  Location: [facility];  Service: Urology;  Laterality: Left;   LEFT HEART CATH AND CORONARY ANGIOGRAPHY N/A 08/02/2016   Procedure: Left Heart Cath and Coronary Angiography;  Surgeon: Mady Bruckner, MD;  Location: [facility];  Service: Cardiovascular;  Laterality: N/A;   LOWER EXTREMITY ANGIOGRAM  08/02/2016   LOWER EXTREMITY ANGIOGRAPHY N/A 08/02/2016   Procedure: Lower Extremity Angiography;  Surgeon: Mady Bruckner, MD;  Location: [facility];  Service: Cardiovascular;  Laterality: N/A;   NEPHRECTOMY Left    VAGINAL HYSTERECTOMY  1991   partial   VENTRAL HERNIA REPAIR N/A 08/05/2022   Procedure: LAPAROSCOPIC VENTRAL WALL HERNIA REPAIR WITH MESH;  Surgeon: Sheldon Standing, MD;  Location: [facility];  Service: General;  Laterality: N/A;    Family History  Problem Relation Age of Onset   Congestive Heart Failure Mother    COPD Father    Cancer Brother    Cancer Brother    Breast cancer Neg Hx     Social History   Socioeconomic History   Marital status: Married    Spouse name: Not on file   Number of children: 2   Years of education: 12   Highest education level: Not on file  Occupational History   Not on file  Tobacco Use   Smoking status: Every Day    Current packs/day: 1.50    Average packs/day: 1.5 packs/day for 49.5 years (74.3 ttl pk-yrs)    Types: Cigarettes    Start date: 05/17/1974   Smokeless tobacco: Never  Vaping Use   Vaping status: Never Used  Substance and Sexual Activity   Alcohol use: No    Alcohol/week: 0.0 standard drinks of alcohol   Drug use: No   Sexual activity: Not Currently  Other Topics Concern   Not on file  Social History Narrative   Patient  does not drink caffeine.   Patient is right handed.    Social Drivers of Corporate investment banker Strain: Low Risk  (05/18/2022)   Received from [facility]   Overall Financial Resource Strain (CARDIA)    Difficulty of Paying Living Expenses: Not hard at all  Food Insecurity: No Food Insecurity (05/24/2023)   Hunger Vital Sign    Worried About Running Out of Food in the Last Year: Never true    Ran Out of Food in the Last Year: Never true  Transportation Needs: No Transportation Needs (05/24/2023)   PRAPARE - Administrator, Civil Service (Medical): No    Lack of Transportation (Non-Medical): No  Physical Activity: Not on file  Stress: Not on file  Social  Connections: Moderately Isolated (05/20/2023)   Social Connection and Isolation Panel    Frequency of Communication with Friends and Family: Once a week    Frequency of Social Gatherings with Friends and Family: Once a week    Attends Religious Services: 1 to 4 times per year    Active Member of [employer] or Organizations: No    Attends Banker Meetings: Never    Marital Status: Married  Catering manager Violence: Not At Risk (05/24/2023)   Humiliation, Afraid, Rape, and Kick questionnaire    Fear of Current or Ex-Partner: No    Emotionally Abused: No    Physically Abused: No    Sexually Abused: No    Allergies  Allergen Reactions   Codeine Shortness Of Breath   Sulfa Antibiotics Other (See Comments)    Unknown    Latex Itching    Current Outpatient Medications  Medication Sig Dispense Refill   acetaminophen  (TYLENOL ) 325 MG tablet Take 2 tablets (650 mg total) by mouth every 4 (four) hours as needed for headache or mild pain.     albuterol  (VENTOLIN  HFA) 108 (90 Base) MCG/ACT inhaler Inhale 1-2 puffs into the lungs every 4 (four) hours as needed for shortness of breath or wheezing.     ALPRAZolam  (XANAX ) 0.5 MG tablet Take 0.25 mg by mouth 2 (two) times daily.     atorvastatin  (LIPITOR ) 40 MG tablet  TAKE 1 TABLET(40 MG) BY MOUTH DAILY 30 tablet 3   Budeson-Glycopyrrol-Formoterol  (BREZTRI AEROSPHERE) 160-9-4.8 MCG/ACT AERO Inhale into the lungs. 1-2 times per day     Cholecalciferol (VITAMIN D3) 125 MCG (5000 UT) CAPS Take 5,000 Units by mouth once a week. Tuesday     clopidogrel  (PLAVIX ) 75 MG tablet Take 1 tablet (75 mg total) by mouth daily. 30 tablet 3   escitalopram  (LEXAPRO ) 20 MG tablet Take 20 mg by mouth daily.     gabapentin  (NEURONTIN ) 300 MG capsule Take 300 mg by mouth every other day. At night     glucose blood (ACCU-CHEK GUIDE) test strip Use as instructed 150 each 2   guaiFENesin -dextromethorphan  (ROBITUSSIN DM) 100-10 MG/5ML syrup Take 10 mLs by mouth every 8 (eight) hours. 118 mL 0   insulin  degludec (TRESIBA  FLEXTOUCH) 100 UNIT/ML FlexTouch Pen Inject 20 Units into the skin at bedtime. 18 mL 1   Insulin  Pen Needle (PEN NEEDLES) 31G X 6 MM MISC Use to inject insulin  once daily 100 each 3   LOKELMA  10 g PACK packet Take 10 g by mouth See admin instructions. Five times a week     nystatin cream (MYCOSTATIN) Apply 1 application  topically 2 (two) times daily as needed (Itching).  5   ondansetron  (ZOFRAN ) 4 MG tablet Take 1 tablet (4 mg total) by mouth every 6 (six) hours as needed for nausea or vomiting. 60 tablet 0   oxyCODONE -acetaminophen  (PERCOCET/ROXICET) 5-325 MG tablet Take 1 tablet by mouth every 6 (six) hours as needed for severe pain or moderate pain. 30 tablet 0   pantoprazole  (PROTONIX ) 40 MG tablet TAKE 1 TABLET(40 MG) BY MOUTH DAILY 90 tablet 0   sodium bicarbonate  650 MG tablet Take 650 mg by mouth 2 (two) times daily.     No current facility-administered medications for this visit.    PHYSICAL EXAM Vitals:   11/29/23 1048 11/29/23 1049  BP: 125/71 (!) 152/79  Pulse: 71   SpO2: 95%   Weight: 145 lb (65.8 kg)   Height: 5' 6 (1.676 m)  Well-appearing elderly woman in no distress Regular rate and rhythm Unlabored breathing No cranial nerve  abnormalities Well-healed left neck incision Normal gait and station   PERTINENT LABORATORY AND RADIOLOGIC DATA  Most recent CBC    Latest Ref Rng & Units 07/18/2023   12:12 PM 06/04/2023    5:57 PM 05/24/2023    9:36 AM  CBC  WBC 4.0 - 10.5 K/uL 9.3  8.4  12.7   Hemoglobin 12.0 - 15.0 g/dL 88.4  9.7  87.6   Hematocrit 36.0 - 46.0 % 36.3  30.9  38.3   Platelets 150 - 400 K/uL 259  230  385      Most recent CMP    Latest Ref Rng & Units 10/05/2023   12:00 AM 07/18/2023   12:12 PM 06/04/2023    5:57 PM  CMP  Glucose 70 - 99 mg/dL  893  811   BUN 4 - 21 46     51  38   Creatinine 0.5 - 1.1 3.1     2.93  2.49   Sodium 135 - 145 mmol/L  135  139   Potassium 3.5 - 5.1 mmol/L  5.2  5.1   Chloride 98 - 111 mmol/L  103  103   CO2 22 - 32 mmol/L  19  24   Calcium  8.9 - 10.3 mg/dL  9.1  7.8   Total Protein 6.5 - 8.1 g/dL  7.1  5.9   Total Bilirubin 0.0 - 1.2 mg/dL  0.4  0.4   Alkaline Phos 38 - 126 U/L  63  76   AST 15 - 41 U/L  15  15   ALT 0 - 44 U/L  15  17      This result is from an external source.    Renal function CrCl cannot be calculated (Patient's most recent lab result is older than the maximum 21 days allowed.).  Hemoglobin A1C (no units)  Date Value  10/05/2023 8.2    LDL Cholesterol (Calc)  Date Value Ref Range Status  01/16/2019 69 mg/dL (calc) Final    Comment:    Reference range: <100 . Desirable range <100 mg/dL for primary prevention;   <70 mg/dL for patients with CHD or diabetic patients  with > or = 2 CHD risk factors. SABRA LDL-C is now calculated using the Martin-Hopkins  calculation, which is a validated novel method providing  better accuracy than the Friedewald equation in the  estimation of LDL-C.  Gladis APPLETHWAITE et al. SANDREA. 7986;689(80): 2061-2068  (http://education.[facility].com/faq/FAQ164)    LDL Chol Calc (NIH)  Date Value Ref Range Status  03/30/2021 90 0 - 99 mg/dL Final   LDL Cholesterol  Date Value Ref Range Status   10/05/2023 93  Final    Carotid Arterial Duplex Study  Patient Name:  HENESSY ROHRER  Date of Exam:   11/29/2023 Medical Rec #: [id number]         Accession #:    [id number] Date of Birth: [birth year]          Patient Gender: F Patient Age:   46 years Exam Location:  [address] Procedure:      VAS US  CAROTID Referring Phys: DEBBY ROBERTSON   --------------------------------------------------------------------------- -----   Indications:       Left CEA 12/02/14. Risk Factors:      Hypertension, hyperlipidemia, coronary artery disease. Comparison Study:  05/31/23: Right ICA 40-59% ECA >50% stenotic subclavian Left  1-39% turbulent subclavian  Performing Technologist: [place] RVT    Examination Guidelines: A complete evaluation includes B-mode imaging, spectral Doppler, color Doppler, and power Doppler as needed of all accessible portions of each vessel. Bilateral testing is considered an integral part of a complete examination. Limited examinations for reoccurring indications may be performed as noted.    Right Carotid Findings: +----------+--------+--------+--------+-------------------------------+---- ----+           PSV cm/sEDV cm/sStenosisPlaque Description             Comments +----------+--------+--------+--------+-------------------------------+---- ----+ CCA Prox  108     16                                                       +----------+--------+--------+--------+-------------------------------+---- ----+ CCA Mid   113     19              heterogenous                             +----------+--------+--------+--------+-------------------------------+---- ----+ CCA Distal103     24                                                       +----------+--------+--------+--------+-------------------------------+---- ----+ ICA Prox  224     46      40-59%  heterogenous, calcific and                                                  focal                                    +----------+--------+--------+--------+-------------------------------+---- ----+ ICA Mid   99      25                                                       +----------+--------+--------+--------+-------------------------------+---- ----+ ICA Distal80      26                                                       +----------+--------+--------+--------+-------------------------------+---- ----+ ECA       209     28      >50%    heterogenous                             +----------+--------+--------+--------+-------------------------------+---- ----+  +----------+--------+-------+--------+-------------------+           PSV cm/sEDV cmsDescribeArm Pressure (mmHG) +----------+--------+-------+--------+-------------------+ [id number]     0      Stenotic                    +----------+--------+-------+--------+-------------------+  +---------+--------+--+--------+--+---------+  VertebralPSV cm/s56EDV cm/s14Antegrade +---------+--------+--+--------+--+---------+     Left Carotid Findings: +----------+--------+--------+--------+------------------------+--------+           PSV cm/sEDV cm/sStenosisPlaque Description      Comments +----------+--------+--------+--------+------------------------+--------+ CCA Prox  103     17                                               +----------+--------+--------+--------+------------------------+--------+ CCA Mid   126     21              diffuse and heterogenous         +----------+--------+--------+--------+------------------------+--------+ CCA Distal177     30              diffuse and heterogenous         +----------+--------+--------+--------+------------------------+--------+ ICA Prox  109     27      1-39%   heterogenous                      +----------+--------+--------+--------+------------------------+--------+ ICA Mid   122     31                                               +----------+--------+--------+--------+------------------------+--------+ ICA Distal76      15                                               +----------+--------+--------+--------+------------------------+--------+ ECA       155     20              calcific                         +----------+--------+--------+--------+------------------------+--------+  +----------+--------+--------+--------+-------------------+           PSV cm/sEDV cm/sDescribeArm Pressure (mmHG) +----------+--------+--------+--------+-------------------+ Subclavian301     0       Stenotic                    +----------+--------+--------+--------+-------------------+  +---------+--------+--+--------+--+---------+ VertebralPSV cm/s82EDV cm/s14Antegrade +---------+--------+--+--------+--+---------+        Summary: Right Carotid: Velocities in the right ICA are consistent with a 40-59%                stenosis. The ECA appears >50% stenosed.  Left Carotid: Velocities in the left ICA are consistent with a 1-39% stenosis.  Vertebrals:  Bilateral vertebral arteries demonstrate antegrade flow. Subclavians: Bilateral subclavian arteries were stenotic.    Debby SAILOR. Magda, MD FACS [facility] Phone Number: [phone] 11/29/2023 11:07 AM   Total time spent on preparing this encounter including chart review, data review, collecting history, examining the patient, coordinating care for this established patient patient, 20 minutes  Portions of this report may have been transcribed using voice recognition software.  Every effort has been made to ensure accuracy; however, inadvertent computerized transcription errors may still be present.

## 2023-11-29 ENCOUNTER — Encounter (HOSPITAL_COMMUNITY)

## 2023-11-29 ENCOUNTER — Ambulatory Visit (INDEPENDENT_AMBULATORY_CARE_PROVIDER_SITE_OTHER): Admitting: Vascular Surgery

## 2023-11-29 ENCOUNTER — Encounter: Payer: Self-pay | Admitting: Vascular Surgery

## 2023-11-29 ENCOUNTER — Ambulatory Visit (INDEPENDENT_AMBULATORY_CARE_PROVIDER_SITE_OTHER)

## 2023-11-29 ENCOUNTER — Ambulatory Visit: Admitting: Vascular Surgery

## 2023-11-29 VITALS — BP 152/79 | HR 71 | Ht 66.0 in | Wt 145.0 lb

## 2023-11-29 DIAGNOSIS — I6523 Occlusion and stenosis of bilateral carotid arteries: Secondary | ICD-10-CM

## 2023-11-30 DIAGNOSIS — Z8673 Personal history of transient ischemic attack (TIA), and cerebral infarction without residual deficits: Secondary | ICD-10-CM | POA: Diagnosis not present

## 2023-11-30 DIAGNOSIS — F419 Anxiety disorder, unspecified: Secondary | ICD-10-CM | POA: Diagnosis not present

## 2023-11-30 DIAGNOSIS — I6523 Occlusion and stenosis of bilateral carotid arteries: Secondary | ICD-10-CM | POA: Diagnosis not present

## 2023-11-30 DIAGNOSIS — C642 Malignant neoplasm of left kidney, except renal pelvis: Secondary | ICD-10-CM | POA: Diagnosis not present

## 2023-11-30 DIAGNOSIS — E1122 Type 2 diabetes mellitus with diabetic chronic kidney disease: Secondary | ICD-10-CM | POA: Diagnosis not present

## 2023-11-30 DIAGNOSIS — N2581 Secondary hyperparathyroidism of renal origin: Secondary | ICD-10-CM | POA: Diagnosis not present

## 2023-11-30 DIAGNOSIS — Z7689 Persons encountering health services in other specified circumstances: Secondary | ICD-10-CM | POA: Diagnosis not present

## 2023-11-30 DIAGNOSIS — N184 Chronic kidney disease, stage 4 (severe): Secondary | ICD-10-CM | POA: Diagnosis not present

## 2023-11-30 DIAGNOSIS — M48061 Spinal stenosis, lumbar region without neurogenic claudication: Secondary | ICD-10-CM | POA: Diagnosis not present

## 2023-11-30 DIAGNOSIS — I10 Essential (primary) hypertension: Secondary | ICD-10-CM | POA: Diagnosis not present

## 2023-11-30 DIAGNOSIS — E1159 Type 2 diabetes mellitus with other circulatory complications: Secondary | ICD-10-CM | POA: Diagnosis not present

## 2023-11-30 DIAGNOSIS — I129 Hypertensive chronic kidney disease with stage 1 through stage 4 chronic kidney disease, or unspecified chronic kidney disease: Secondary | ICD-10-CM | POA: Diagnosis not present

## 2023-12-13 ENCOUNTER — Ambulatory Visit: Admitting: Nurse Practitioner

## 2023-12-20 DIAGNOSIS — D631 Anemia in chronic kidney disease: Secondary | ICD-10-CM | POA: Diagnosis not present

## 2023-12-20 DIAGNOSIS — N189 Chronic kidney disease, unspecified: Secondary | ICD-10-CM | POA: Diagnosis not present

## 2023-12-20 DIAGNOSIS — R809 Proteinuria, unspecified: Secondary | ICD-10-CM | POA: Diagnosis not present

## 2023-12-20 DIAGNOSIS — E211 Secondary hyperparathyroidism, not elsewhere classified: Secondary | ICD-10-CM | POA: Diagnosis not present

## 2023-12-28 ENCOUNTER — Encounter (INDEPENDENT_AMBULATORY_CARE_PROVIDER_SITE_OTHER): Payer: Self-pay | Admitting: Gastroenterology

## 2023-12-29 DIAGNOSIS — I129 Hypertensive chronic kidney disease with stage 1 through stage 4 chronic kidney disease, or unspecified chronic kidney disease: Secondary | ICD-10-CM | POA: Diagnosis not present

## 2023-12-29 DIAGNOSIS — E1122 Type 2 diabetes mellitus with diabetic chronic kidney disease: Secondary | ICD-10-CM | POA: Diagnosis not present

## 2023-12-29 DIAGNOSIS — N2581 Secondary hyperparathyroidism of renal origin: Secondary | ICD-10-CM | POA: Diagnosis not present

## 2023-12-29 DIAGNOSIS — N184 Chronic kidney disease, stage 4 (severe): Secondary | ICD-10-CM | POA: Diagnosis not present

## 2024-01-13 ENCOUNTER — Ambulatory Visit: Admitting: Nurse Practitioner

## 2024-01-13 ENCOUNTER — Encounter: Payer: Self-pay | Admitting: Nurse Practitioner

## 2024-01-13 VITALS — BP 108/62 | HR 87 | Ht 66.0 in | Wt 145.2 lb

## 2024-01-13 DIAGNOSIS — I10 Essential (primary) hypertension: Secondary | ICD-10-CM | POA: Diagnosis not present

## 2024-01-13 DIAGNOSIS — E559 Vitamin D deficiency, unspecified: Secondary | ICD-10-CM

## 2024-01-13 DIAGNOSIS — Z794 Long term (current) use of insulin: Secondary | ICD-10-CM | POA: Diagnosis not present

## 2024-01-13 DIAGNOSIS — E1159 Type 2 diabetes mellitus with other circulatory complications: Secondary | ICD-10-CM | POA: Diagnosis not present

## 2024-01-13 DIAGNOSIS — E782 Mixed hyperlipidemia: Secondary | ICD-10-CM | POA: Diagnosis not present

## 2024-01-13 LAB — POCT GLYCOSYLATED HEMOGLOBIN (HGB A1C): Hemoglobin A1C: 9.9 % — AB (ref 4.0–5.6)

## 2024-01-13 MED ORDER — PEN NEEDLES 31G X 6 MM MISC: 3 refills | Status: AC

## 2024-01-13 MED ORDER — TRESIBA FLEXTOUCH 100 UNIT/ML ~~LOC~~ SOPN: 20.0000 [IU] | PEN_INJECTOR | Freq: Every day | SUBCUTANEOUS | 1 refills | Status: AC

## 2024-01-13 NOTE — Progress Notes (Signed)
 01/13/2024, 11:33 AM                                Endocrinology follow-up note   Subjective:    Patient ID: Grace Hess, female    DOB: 1956-01-28.  Grace Hess is being seen in follow-up for management of currently uncontrolled symptomatic diabetes requested by  Shona Norleen PEDLAR, MD.   Past Medical History:  Diagnosis Date   Anxiety    CAD (coronary artery disease)    a. s/p DES to RCA in 07/2016 with residual mid-LAD stenosis   Cancer (HCC)    skin cancer on finger,, cancer left kidney   Cervicalgia    Chest pain, unspecified    Chronic kidney disease    Depression    GERD (gastroesophageal reflux disease)    Headache    bad ones after my stroke in 2016; only have them when I get bad news now (08/02/2016)   Heart murmur    Heavy smoker (more than 20 cigarettes per day)    Scheduled for LDCT screening 02/11/15   History of hiatal hernia    History of kidney stones    no cysto/OR (12/10/2014)   Hypercholesteremia    Hypertension    Incarcerated incisional hernia 08/05/2022   Left renal mass    Obesity    Palpitations    Reflux esophagitis    Stroke [facility])    they said I had a  MINI stroke a few months back/MRI (12/10/2014)   TIA (transient ischemic attack) 11/28/2014   thelbert 11/28/2014   Type II diabetes mellitus (HCC)     Past Surgical History:  Procedure Laterality Date   ABDOMINAL AORTOGRAM N/A 08/02/2016   Procedure: Abdominal Aortogram;  Surgeon: Mady Bruckner, MD;  Location: [facility];  Service: Cardiovascular;  Laterality: N/A;   CAROTID STENT     CHOLECYSTECTOMY OPEN  1978   COLONOSCOPY  2010   single diverticulum in sigmoid colon   CORONARY ANGIOPLASTY WITH STENT PLACEMENT  08/02/2016   to RCA   CORONARY STENT INTERVENTION N/A 08/02/2016   Procedure: Coronary Stent Intervention;  Surgeon: Mady Bruckner, MD;  Location: [facility];   Service: Cardiovascular;  Laterality: N/A;  RCA   CYSTOSCOPY W/ RETROGRADES Bilateral 07/15/2021   Procedure: CYSTOSCOPY WITH RETROGRADE PYELOGRAM;  Surgeon: Roseann Adine PARAS., MD;  Location: [facility];  Service: Urology;  Laterality: Bilateral;   CYSTOSCOPY WITH STENT PLACEMENT Left 07/15/2021   Procedure: CYSTOSCOPY WITH STENT PLACEMENT;  Surgeon: Roseann Adine PARAS., MD;  Location: [facility];  Service: Urology;  Laterality: Left;   ENDARTERECTOMY Left 12/02/2014   Procedure: ENDARTERECTOMY CAROTID;  Surgeon: Krystal JULIANNA Doing, MD;  Location: [facility] OR;  Service: Vascular;  Laterality: Left;   HERNIA REPAIR     LAPAROSCOPIC NEPHRECTOMY Left    LAPAROSCOPIC NEPHRECTOMY Left 11/25/2021   Procedure: LEFT LAPAROSCOPIC RADICAL NEPHRECTOMY;  Surgeon: Cam Morene ORN, MD;  Location: [facility];  Service: Urology;  Laterality: Left;   LEFT HEART CATH AND CORONARY ANGIOGRAPHY N/A 08/02/2016  Procedure: Left Heart Cath and Coronary Angiography;  Surgeon: Mady Bruckner, MD;  Location: [facility];  Service: Cardiovascular;  Laterality: N/A;   LOWER EXTREMITY ANGIOGRAM  08/02/2016   LOWER EXTREMITY ANGIOGRAPHY N/A 08/02/2016   Procedure: Lower Extremity Angiography;  Surgeon: Mady Bruckner, MD;  Location: [facility];  Service: Cardiovascular;  Laterality: N/A;   NEPHRECTOMY Left    VAGINAL HYSTERECTOMY  1991   partial   VENTRAL HERNIA REPAIR N/A 08/05/2022   Procedure: LAPAROSCOPIC VENTRAL WALL HERNIA REPAIR WITH MESH;  Surgeon: Sheldon Standing, MD;  Location: [facility];  Service: General;  Laterality: N/A;    Social History   Socioeconomic History   Marital status: Married    Spouse name: Not on file   Number of children: 2   Years of education: 12   Highest education level: Not on file  Occupational History   Not on file  Tobacco Use   Smoking status: Every Day    Current packs/day: 1.50    Average packs/day: 1.5 packs/day for 49.7 years (74.5 ttl pk-yrs)    Types: Cigarettes     Start date: 05/17/1974   Smokeless tobacco: Never  Vaping Use   Vaping status: Never Used  Substance and Sexual Activity   Alcohol use: No    Alcohol/week: 0.0 standard drinks of alcohol   Drug use: No   Sexual activity: Not Currently  Other Topics Concern   Not on file  Social History Narrative   Patient does not drink caffeine.   Patient is right handed.    Social Drivers of Corporate Investment Banker Strain: Low Risk  (05/18/2022)   [facility] from [facility]   Overall Financial Resource Strain (CARDIA)    Difficulty of Paying Living Expenses: Not hard at all  Food Insecurity: No Food Insecurity (05/24/2023)   Hunger Vital Sign    Worried About Running Out of Food in the Last Year: Never true    Ran Out of Food in the Last Year: Never true  Transportation Needs: No Transportation Needs (05/24/2023)   PRAPARE - Administrator, Civil Service (Medical): No    Lack of Transportation (Non-Medical): No  Physical Activity: Not on file  Stress: Not on file  Social Connections: Moderately Isolated (05/20/2023)   Social Connection and Isolation Panel    Frequency of Communication with Friends and Family: Once a week    Frequency of Social Gatherings with Friends and Family: Once a week    Attends Religious Services: 1 to 4 times per year    Active Member of [employer] or Organizations: No    Attends Engineer, Structural: Never    Marital Status: Married    Family History  Problem Relation Age of Onset   Congestive Heart Failure Mother    COPD Father    Cancer Brother    Cancer Brother    Breast cancer Neg Hx     Outpatient Encounter Medications as of 01/13/2024  Medication Sig   acetaminophen  (TYLENOL ) 325 MG tablet Take 2 tablets (650 mg total) by mouth every 4 (four) hours as needed for headache or mild pain.   albuterol  (VENTOLIN  HFA) 108 (90 Base) MCG/ACT inhaler Inhale 1-2 puffs into the lungs every 4 (four) hours as needed for shortness of breath or  wheezing.   ALPRAZolam  (XANAX ) 0.5 MG tablet Take 0.25 mg by mouth 2 (two) times daily.   atorvastatin  (LIPITOR ) 40 MG tablet TAKE 1 TABLET(40 MG) BY MOUTH  DAILY   Budeson-Glycopyrrol-Formoterol  (BREZTRI AEROSPHERE) 160-9-4.8 MCG/ACT AERO Inhale into the lungs. 1-2 times per day   Cholecalciferol (VITAMIN D3) 125 MCG (5000 UT) CAPS Take 5,000 Units by mouth once a week. Tuesday   clopidogrel  (PLAVIX ) 75 MG tablet Take 1 tablet (75 mg total) by mouth daily.   escitalopram  (LEXAPRO ) 20 MG tablet Take 20 mg by mouth daily.   gabapentin  (NEURONTIN ) 300 MG capsule Take 300 mg by mouth every other day. At night   glucose blood (ACCU-CHEK GUIDE) test strip Use as instructed   LOKELMA  10 g PACK packet Take 10 g by mouth See admin instructions. Five times a week   nystatin cream (MYCOSTATIN) Apply 1 application  topically 2 (two) times daily as needed (Itching).   ondansetron  (ZOFRAN ) 4 MG tablet Take 1 tablet (4 mg total) by mouth every 6 (six) hours as needed for nausea or vomiting.   oxyCODONE -acetaminophen  (PERCOCET/ROXICET) 5-325 MG tablet Take 1 tablet by mouth every 6 (six) hours as needed for severe pain or moderate pain.   pantoprazole  (PROTONIX ) 40 MG tablet TAKE 1 TABLET(40 MG) BY MOUTH DAILY   sodium bicarbonate  650 MG tablet Take 650 mg by mouth 2 (two) times daily.   [DISCONTINUED] insulin  degludec (TRESIBA  FLEXTOUCH) 100 UNIT/ML FlexTouch Pen Inject 20 Units into the skin at bedtime.   [DISCONTINUED] Insulin  Pen Needle (PEN NEEDLES) 31G X 6 MM MISC Use to inject insulin  once daily   insulin  degludec (TRESIBA  FLEXTOUCH) 100 UNIT/ML FlexTouch Pen Inject 20 Units into the skin at bedtime.   Insulin  Pen Needle (PEN NEEDLES) 31G X 6 MM MISC Use to inject insulin  once daily   [DISCONTINUED] guaiFENesin -dextromethorphan  (ROBITUSSIN DM) 100-10 MG/5ML syrup Take 10 mLs by mouth every 8 (eight) hours. (Patient not taking: Reported on 01/13/2024)   No facility-administered encounter medications on  file as of 01/13/2024.    ALLERGIES: Allergies  Allergen Reactions   Codeine Shortness Of Breath   Sulfa Antibiotics Other (See Comments)    Unknown    Latex Itching    VACCINATION STATUS: Immunization History  Administered Date(s) Administered   Influenza,inj,Quad PF,6+ Mos 11/29/2014   Pneumococcal Polysaccharide-23 11/29/2014    Diabetes She presents for her follow-up diabetic visit. She has type 2 diabetes mellitus. Onset time: She was diagnosed at approximate age of 50 years. Her disease course has been worsening. There are no hypoglycemic associated symptoms. Pertinent negatives for hypoglycemia include no confusion, headaches, pallor or seizures. Associated symptoms include fatigue and foot paresthesias. Pertinent negatives for diabetes include no chest pain, no polydipsia, no polyphagia, no polyuria and no weight loss. There are no hypoglycemic complications. Symptoms are stable. Diabetic complications include a CVA, heart disease and nephropathy. Risk factors for coronary artery disease include dyslipidemia, diabetes mellitus, family history, hypertension, tobacco exposure, sedentary lifestyle and post-menopausal. Current diabetic treatment includes insulin  injections. She is compliant with treatment most of the time. Her weight is fluctuating minimally. She is following a generally healthy diet. When asked about meal planning, she reported none. She has not had a previous visit with a dietitian. She never participates in exercise. Her home blood glucose trend is increasing steadily. Her overall blood glucose range is >200 mg/dl. (She presents today with her meter and logs showing inconsistent glucose monitoring and gross hyperglycemia overall.  Her POCT A1c today is 9.9%, increasing from last visit of 8.2%.  Analysis of her meter shows 7-day average of 273 (with 5 readings), 14-day average of 254 (10 readings), 30-day average of 209 (  24 readings), 90-day average of 170 (87 readings).   She admits she has not been taking her Lantus  every night, says she will skip it when she eats right.) An ACE inhibitor/angiotensin II receptor blocker is being taken. She does not see a podiatrist.Eye exam is not current.  Hyperlipidemia This is a chronic problem. The current episode started more than 1 year ago. The problem is controlled. Recent lipid tests were reviewed and are normal. Exacerbating diseases include chronic renal disease and diabetes. Factors aggravating her hyperlipidemia include beta blockers, fatty foods and smoking. Pertinent negatives include no chest pain, myalgias or shortness of breath. Current antihyperlipidemic treatment includes statins. The current treatment provides mild improvement of lipids. Compliance problems include adherence to diet and adherence to exercise.  Risk factors for coronary artery disease include diabetes mellitus, dyslipidemia, hypertension, a sedentary lifestyle, post-menopausal and family history.  Hypertension This is a chronic problem. The current episode started more than 1 year ago. The problem has been resolved since onset. The problem is controlled. Pertinent negatives include no chest pain, headaches, palpitations or shortness of breath. There are no associated agents to hypertension. Risk factors for coronary artery disease include dyslipidemia, diabetes mellitus, sedentary lifestyle, smoking/tobacco exposure and post-menopausal state. Past treatments include ACE inhibitors and beta blockers. The current treatment provides moderate improvement. Compliance problems include exercise and diet.  Hypertensive end-organ damage includes kidney disease, CAD/MI and CVA. Identifiable causes of hypertension include chronic renal disease.    Review of systems  Constitutional: + stable body weight,  current Body mass index is 23.44 kg/m. , no fatigue, no subjective hyperthermia, no subjective hypothermia Eyes: no blurry vision, no xerophthalmia ENT: no sore  throat, no nodules palpated in throat, no dysphagia/odynophagia, no hoarseness Cardiovascular: no chest pain, no shortness of breath, no palpitations, no leg swelling Respiratory: no cough, no shortness of breath Gastrointestinal: no nausea/vomiting/diarrhea Musculoskeletal: no muscle/joint aches Skin: no rashes, no hyperemia Neurological: no tremors, no dizziness, + numbness/tingling to BLE (feet) Psychiatric: no depression, no anxiety  Objective:    BP 108/62 (BP Location: Left Arm, Patient Position: Sitting, Cuff Size: Large)   Pulse 87   Ht 5' 6 (1.676 m)   Wt 145 lb 3.2 oz (65.9 kg)   BMI 23.44 kg/m   Wt Readings from Last 3 Encounters:  01/13/24 145 lb 3.2 oz (65.9 kg)  11/29/23 145 lb (65.8 kg)  10/11/23 141 lb 3.2 oz (64 kg)    BP Readings from Last 3 Encounters:  01/13/24 108/62  11/29/23 (!) 152/79  10/11/23 108/68    Physical Exam- Limited  Constitutional:  Body mass index is 23.44 kg/m. , not in acute distress, normal state of mind Eyes:  EOMI, no exophthalmos Musculoskeletal: no gross deformities, strength intact in all four extremities, no gross restriction of joint movements Skin:  no rashes, no hyperemia Neurological: no tremor with outstretched hands  Diabetic Foot Exam - Simple   No data filed    CMP     Component Value Date/Time   NA 135 07/18/2023 1212   NA 141 09/28/2021 0822   K 5.2 (H) 07/18/2023 1212   CL 103 07/18/2023 1212   CO2 19 (L) 07/18/2023 1212   GLUCOSE 106 (H) 07/18/2023 1212   BUN 46 (A) 10/05/2023 0000   CREATININE 3.1 (A) 10/05/2023 0000   CREATININE 2.93 (H) 07/18/2023 1212   CREATININE 1.38 (H) 08/20/2019 0719   CALCIUM  9.1 07/18/2023 1212   PROT 7.1 07/18/2023 1212   PROT 6.6 09/28/2021  9177   ALBUMIN  3.7 07/18/2023 1212   ALBUMIN  4.2 09/28/2021 0822   AST 15 07/18/2023 1212   ALT 15 07/18/2023 1212   ALKPHOS 63 07/18/2023 1212   BILITOT 0.4 07/18/2023 1212   BILITOT 0.3 09/28/2021 0822   GFRNONAA 17 (L)  07/18/2023 1212   GFRNONAA 40 (L) 08/20/2019 0719   GFRAA 51 (L) 03/26/2020 0927   GFRAA 47 (L) 08/20/2019 0719     Diabetic Labs (most recent): Lab Results  Component Value Date   HGBA1C 9.9 (A) 01/13/2024   HGBA1C 8.2 10/05/2023   HGBA1C 7.8 (A) 08/11/2023   MICROALBUR 26.7 08/20/2019    Lipid Panel     Component Value Date/Time   CHOL 168 05/25/2023 0413   CHOL 161 03/30/2021 0803   TRIG 300 (A) 10/05/2023 0000   HDL 38 (L) 05/25/2023 0413   HDL 35 (L) 03/30/2021 0803   CHOLHDL 4.4 05/25/2023 0413   VLDL 54 (H) 05/25/2023 0413   LDLCALC 93 10/05/2023 0000   LDLCALC 90 03/30/2021 0803   LDLCALC 69 01/16/2019 0937   LABVLDL 36 03/30/2021 0803       Assessment & Plan:   1) DM type 2 causing vascular disease (HCC)  - Grace Hess has currently uncontrolled symptomatic type 2 DM since  68 years of age.  She presents today with her meter and logs showing inconsistent glucose monitoring and gross hyperglycemia overall.  Her POCT A1c today is 9.9%, increasing from last visit of 8.2%.  Analysis of her meter shows 7-day average of 273 (with 5 readings), 14-day average of 254 (10 readings), 30-day average of 209 (24 readings), 90-day average of 170 (87 readings).  She admits she has not been taking her Lantus  every night, says she will skip it when she eats right.  -Recent labs reviewed showing worsening kidney function- will refer to nephrology for further evaluation and treatment.  - I had a long discussion with her about the progressive nature of diabetes and the pathology behind its complications. -her diabetes is complicated by coronary artery disease, CVA, nephropathy, peripheral neuropathy, chronic heavy smoking, sedentary life and she remains at a high risk for more acute and chronic complications which include CAD, CVA, CKD, retinopathy, and neuropathy. These are all discussed in detail with her.  - Nutritional counseling repeated/built upon at each  appointment.  - The patient admits there is a room for improvement in their diet and drink choices. -  Suggestion is made for the patient to avoid simple carbohydrates from their diet including Cakes, Sweet Desserts / Pastries, Ice Cream, Soda (diet and regular), Sweet Tea, Candies, Chips, Cookies, Sweet Pastries, Store Bought Juices, Alcohol in Excess of 1-2 drinks a day, Artificial Sweeteners, Coffee Creamer, and Sugar-free Products. This will help patient to have stable blood glucose profile and potentially avoid unintended weight gain.   - I encouraged the patient to switch to unprocessed or minimally processed complex starch and increased protein intake (animal or plant source), fruits, and vegetables.   - Patient is advised to stick to a routine mealtimes to eat 3 meals a day and avoid unnecessary snacks (to snack only to correct hypoglycemia).  - I have approached her with the following individualized plan to manage  her diabetes and patient agrees:   -She is advised to restart taking her Tresiba  20 units SQ nightly (every night unless night time glucose less than 100).    The aim is to try and get fasting readings between 90-130 consistently.  She is advised to restart monitoring glucose twice daily, before breakfast and before bed, and to call the clinic if she has readings less than 70 or above 200 for 3 tests in a row. She did not care for the Dexcom.  She is not a candidate for Metformin  due to kidney disease, she does not meet criteria for GLP1 use with BMI less than 25.  She was on Glipizide  in the past but had severe hypoglycemia.  - Specific targets for  A1c;  LDL, HDL, Triglycerides, and  Waist Circumference were discussed with the patient.  2) Blood Pressure /Hypertension:  Her blood pressure is tight, recently taken off all BP meds.    3) Lipids/Hyperlipidemia:   Her most recent lipid panel from 10/05/23 shows controlled LDL of 93 and elevated triglycerides of 300.  She is  advised to continue Crestor  20 mg po daily at bedtime.  Side effects and precautions discussed with her.  She is advised to avoid fried foods and butter.    4)  Weight/Diet:  Her Body mass index is 23.44 kg/m..  she is not a candidate for more weight loss.   Exercise, and detailed carbohydrates information provided  -  detailed on discharge instructions.  5) Chronic Care/Health Maintenance: -she is on ACEI/ARB and Statin medications and is encouraged to initiate and continue to follow up with Ophthalmology, Dentist, Podiatrist at least yearly or according to recommendations, and advised to quit smoking. I have recommended yearly flu vaccine and pneumonia vaccine at least every 5 years; moderate intensity exercise for up to 150 minutes weekly; and sleep for at least 7 hours a day.  - she is advised to maintain close follow up with Shona Norleen PEDLAR, MD for primary care needs.     I spent  37  minutes in the care of the patient today including review of labs from CMP, Lipids, Thyroid  Function, Hematology (current and previous including abstractions from other facilities); face-to-face time discussing  her blood glucose readings/logs, discussing hypoglycemia and hyperglycemia episodes and symptoms, medications doses, her options of short and long term treatment based on the latest standards of care / guidelines;  discussion about incorporating lifestyle medicine;  and documenting the encounter. Risk reduction counseling performed per USPSTF guidelines to reduce obesity and cardiovascular risk factors.     Please refer to Patient Instructions for Blood Glucose Monitoring and Insulin /Medications Dosing Guide  in media tab for additional information. Please  also refer to  Patient Self Inventory in the Media  tab for reviewed elements of pertinent patient history.  Grace Hess participated in the discussions, expressed understanding, and voiced agreement with the above plans.  All questions were  answered to her satisfaction. she is encouraged to contact clinic should she have any questions or concerns prior to her return visit.   Follow up plan: - Return in about 3 months (around 04/14/2024) for Diabetes F/U with A1c in office, No previsit labs, Bring meter and logs.  Benton Rio, [facility] [address] [place], [place] [address] Phone: [phone] Fax: [phone]  01/13/2024, 11:33 AM

## 2024-01-18 ENCOUNTER — Other Ambulatory Visit (INDEPENDENT_AMBULATORY_CARE_PROVIDER_SITE_OTHER): Payer: Self-pay | Admitting: Gastroenterology

## 2024-01-18 NOTE — Telephone Encounter (Signed)
 Last seen 10/2022. Needs office visit.

## 2024-01-21 ENCOUNTER — Ambulatory Visit: Admission: EM | Admit: 2024-01-21 | Discharge: 2024-01-21 | Disposition: A | Discharge status: Home / Self Care

## 2024-01-21 ENCOUNTER — Other Ambulatory Visit: Payer: Self-pay

## 2024-01-21 ENCOUNTER — Ambulatory Visit

## 2024-01-21 DIAGNOSIS — I700 Atherosclerosis of aorta: Secondary | ICD-10-CM | POA: Diagnosis not present

## 2024-01-21 DIAGNOSIS — I517 Cardiomegaly: Secondary | ICD-10-CM | POA: Diagnosis not present

## 2024-01-21 DIAGNOSIS — Z8709 Personal history of other diseases of the respiratory system: Secondary | ICD-10-CM | POA: Diagnosis not present

## 2024-01-21 DIAGNOSIS — J449 Chronic obstructive pulmonary disease, unspecified: Secondary | ICD-10-CM | POA: Diagnosis not present

## 2024-01-21 DIAGNOSIS — R252 Cramp and spasm: Secondary | ICD-10-CM

## 2024-01-21 DIAGNOSIS — R0602 Shortness of breath: Secondary | ICD-10-CM

## 2024-01-21 LAB — POC SOFIA SARS ANTIGEN FIA: SARS Coronavirus 2 Ag: NEGATIVE

## 2024-01-21 MED ORDER — ALBUTEROL SULFATE HFA 108 (90 BASE) MCG/ACT IN AERS: 2.0000 | INHALATION_SPRAY | Freq: Four times a day (QID) | RESPIRATORY_TRACT | 0 refills | Status: AC | PRN

## 2024-01-21 MED ORDER — ALBUTEROL SULFATE (2.5 MG/3ML) 0.083% IN NEBU: 2.5000 mg | INHALATION_SOLUTION | Freq: Four times a day (QID) | RESPIRATORY_TRACT | 0 refills | Status: AC | PRN

## 2024-01-21 MED ORDER — IPRATROPIUM-ALBUTEROL 0.5-2.5 (3) MG/3ML IN SOLN
3.0000 mL | Freq: Once | RESPIRATORY_TRACT | Status: AC
  Administered 2024-01-21: 3 mL via RESPIRATORY_TRACT

## 2024-01-21 NOTE — Discharge Instructions (Addendum)
 The COVID test was negative. An albuterol  inhaler was prescribed for your shortness of breath. You may take over-the-counter Tylenol  as needed for pain or discomfort. Recommend discontinuing smoking until your symptoms improve.   Follow-up with your primary care physician for reevaluation of your potassium.  Go to the emergency department if you experience worsening leg cramps or other concerns. Go to the emergency department if you experience continued shortness of breath, difficulty breathing, or develop new symptoms of chest pain or other concerns. Follow-up with your primary care physician if your symptoms fail to improve. Follow-up as needed.

## 2024-01-21 NOTE — ED Triage Notes (Addendum)
 Pt reports she has lightheadedness, reports cramps in legs, nausea, SHOB since this morning.  Wears o2 at home unsure of liters. States she only uses o2 when she needs it Reports recently started mag gluconate.

## 2024-01-21 NOTE — ED Provider Notes (Addendum)
 [facility]    CSN: [id number] Arrival date & time: 01/21/24  1204      History   Chief Complaint Chief Complaint  Patient presents with   Shortness of Breath    HPI Grace Hess is a 68 y.o. female.   The history is provided by the patient.   Patient presents for complaints of shortness of breath that started this morning upon wakening.  Patient reports shortness of breath when she is at rest and with activity.  She also endorses lightheadedness, nausea, and cramping in her lower extremities.  Patient reports prior history of COPD.  States that she is continuing to smoke at this time.  She states that she does wear home O2, but cannot inform of the amount of oxygen  that she wears.  She denies fever, chills, chest pain, abdominal pain, vomiting, or diarrhea.  Patient denies any obvious close sick contacts.  She has not taken any medications for her symptoms. Past Medical History:  Diagnosis Date   Anxiety    CAD (coronary artery disease)    a. s/p DES to RCA in 07/2016 with residual mid-LAD stenosis   Cancer (HCC)    skin cancer on finger,, cancer left kidney   Cervicalgia    Chest pain, unspecified    Chronic kidney disease    Depression    GERD (gastroesophageal reflux disease)    Headache    bad ones after my stroke in 2016; only have them when I get bad news now (08/02/2016)   Heart murmur    Heavy smoker (more than 20 cigarettes per day)    Scheduled for LDCT screening 02/11/15   History of hiatal hernia    History of kidney stones    no cysto/OR (12/10/2014)   Hypercholesteremia    Hypertension    Incarcerated incisional hernia 08/05/2022   Left renal mass    Obesity    Palpitations    Reflux esophagitis    Stroke [facility])    they said I had a  MINI stroke a few months back/MRI (12/10/2014)   TIA (transient ischemic attack) 11/28/2014   thelbert 11/28/2014   Type II diabetes mellitus (HCC)     Patient Active Problem List   Diagnosis Date  Noted   Iron deficiency anemia 01/18/2023   Diarrhea 11/02/2022   Renal cell adenocarcinoma (HCC) 11/25/2021   Renal cell cancer, left (HCC) 10/01/2021   Vitamin D  deficiency 08/23/2019   Loss of weight 07/17/2019   PAD (peripheral artery disease) 07/04/2019   IBS (irritable bowel syndrome) 01/16/2019   Uncontrolled type 2 diabetes mellitus with hyperglycemia, without long-term current use of insulin  (HCC) 01/08/2019   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) 12/21/2018   Lumbar back pain 12/21/2018   Mixed hyperlipidemia 12/21/2018   CAD S/P percutaneous coronary angioplasty 08/03/2016   Claudication 08/02/2016   Stable angina 08/02/2016   Heavy smoker (more than 20 cigarettes per day)    Headache 12/10/2014   Carotid artery disease 12/01/2014   Tobacco use disorder 11/28/2014   HYPERCHOLESTEROLEMIA  IIA 09/13/2008   Depression with anxiety 09/13/2008   Essential hypertension, benign 09/13/2008   GERD (gastroesophageal reflux disease) 09/13/2008    Past Surgical History:  Procedure Laterality Date   ABDOMINAL AORTOGRAM N/A 08/02/2016   Procedure: Abdominal Aortogram;  Surgeon: Mady Bruckner, MD;  Location: [facility];  Service: Cardiovascular;  Laterality: N/A;   CAROTID STENT     CHOLECYSTECTOMY OPEN  1978   COLONOSCOPY  2010   single diverticulum in sigmoid colon   CORONARY ANGIOPLASTY WITH STENT PLACEMENT  08/02/2016   to RCA   CORONARY STENT INTERVENTION N/A 08/02/2016   Procedure: Coronary Stent Intervention;  Surgeon: Mady Bruckner, MD;  Location: [facility];  Service: Cardiovascular;  Laterality: N/A;  RCA   CYSTOSCOPY W/ RETROGRADES Bilateral 07/15/2021   Procedure: CYSTOSCOPY WITH RETROGRADE PYELOGRAM;  Surgeon: Roseann Adine PARAS., MD;  Location: [facility];  Service: Urology;  Laterality: Bilateral;   CYSTOSCOPY WITH STENT PLACEMENT Left 07/15/2021   Procedure: CYSTOSCOPY WITH STENT PLACEMENT;   Surgeon: Roseann Adine PARAS., MD;  Location: [facility];  Service: Urology;  Laterality: Left;   ENDARTERECTOMY Left 12/02/2014   Procedure: ENDARTERECTOMY CAROTID;  Surgeon: Krystal JULIANNA Doing, MD;  Location: [facility];  Service: Vascular;  Laterality: Left;   HERNIA REPAIR     LAPAROSCOPIC NEPHRECTOMY Left    LAPAROSCOPIC NEPHRECTOMY Left 11/25/2021   Procedure: LEFT LAPAROSCOPIC RADICAL NEPHRECTOMY;  Surgeon: Cam Morene ORN, MD;  Location: [facility];  Service: Urology;  Laterality: Left;   LEFT HEART CATH AND CORONARY ANGIOGRAPHY N/A 08/02/2016   Procedure: Left Heart Cath and Coronary Angiography;  Surgeon: Mady Bruckner, MD;  Location: [facility];  Service: Cardiovascular;  Laterality: N/A;   LOWER EXTREMITY ANGIOGRAM  08/02/2016   LOWER EXTREMITY ANGIOGRAPHY N/A 08/02/2016   Procedure: Lower Extremity Angiography;  Surgeon: Mady Bruckner, MD;  Location: [facility];  Service: Cardiovascular;  Laterality: N/A;   NEPHRECTOMY Left    VAGINAL HYSTERECTOMY  1991   partial   VENTRAL HERNIA REPAIR N/A 08/05/2022   Procedure: LAPAROSCOPIC VENTRAL WALL HERNIA REPAIR WITH MESH;  Surgeon: Sheldon Standing, MD;  Location: [facility];  Service: General;  Laterality: N/A;    OB History   No obstetric history on file.      Home Medications    Prior to Admission medications   Medication Sig Start Date End Date Taking? Authorizing Provider  albuterol  (VENTOLIN  HFA) 108 (90 Base) MCG/ACT inhaler Inhale 2 puffs into the lungs every 6 (six) hours as needed. 01/21/24  Yes Leath-Warren, Etta PARAS, NP  magnesium  gluconate (MAGONATE) 500 (27 Mg) MG TABS tablet Take 100 mg by mouth in the morning and at bedtime.   Yes [provider]  acetaminophen  (TYLENOL ) 325 MG tablet Take 2 tablets (650 mg total) by mouth every 4 (four) hours as needed for headache or mild pain. 08/03/16   Maxie Herlene POUR, PA-C  ALPRAZolam  (XANAX ) 0.5 MG tablet Take 0.25 mg by mouth 2 (two) times daily. 03/20/19    [provider]  atorvastatin  (LIPITOR ) 40 MG tablet TAKE 1 TABLET(40 MG) BY MOUTH DAILY 09/21/23   Alvan Dorn JULIANNA, MD  Budeson-Glycopyrrol-Formoterol  (BREZTRI AEROSPHERE) 160-9-4.8 MCG/ACT AERO Inhale into the lungs. 1-2 times per day    [provider]  Cholecalciferol (VITAMIN D3) 125 MCG (5000 UT) CAPS Take 5,000 Units by mouth once a week. Tuesday    [provider]  clopidogrel  (PLAVIX ) 75 MG tablet Take 1 tablet (75 mg total) by mouth daily. 12/03/14   Rai, Nydia POUR, MD  escitalopram  (LEXAPRO ) 20 MG tablet Take 20 mg by mouth daily. 01/01/21   [provider]  gabapentin  (NEURONTIN ) 300 MG capsule Take 300 mg by mouth every other day. At night    [provider]  glucose blood (ACCU-CHEK GUIDE) test strip Use as instructed 08/23/19   Nida, Gebreselassie W, MD  insulin  degludec (TRESIBA   FLEXTOUCH) 100 UNIT/ML FlexTouch Pen Inject 20 Units into the skin at bedtime. 01/13/24   Therisa Benton PARAS, NP  Insulin  Pen Needle (PEN NEEDLES) 31G X 6 MM MISC Use to inject insulin  once daily 01/13/24   Therisa Benton PARAS, NP  LOKELMA  10 g PACK packet Take 10 g by mouth See admin instructions. Five times a week 07/14/22   [provider]  nystatin cream (MYCOSTATIN) Apply 1 application  topically 2 (two) times daily as needed (Itching). 07/10/16   [provider]  ondansetron  (ZOFRAN ) 4 MG tablet Take 1 tablet (4 mg total) by mouth every 6 (six) hours as needed for nausea or vomiting. 05/25/23   Laurence Locus, DO  oxyCODONE -acetaminophen  (PERCOCET/ROXICET) 5-325 MG tablet Take 1 tablet by mouth every 6 (six) hours as needed for severe pain or moderate pain. 08/05/22   Sheldon Standing, MD  pantoprazole  (PROTONIX ) 40 MG tablet TAKE 1 TABLET(40 MG) BY MOUTH DAILY 10/18/23   Carlan, Chelsea L, NP  sodium bicarbonate  650 MG tablet Take 650 mg by mouth 2 (two) times daily. 06/14/22   [provider]    Family History Family History  Problem Relation  Age of Onset   Congestive Heart Failure Mother    COPD Father    Cancer Brother    Cancer Brother    Breast cancer Neg Hx     Social History Social History   Tobacco Use   Smoking status: Every Day    Current packs/day: 1.50    Average packs/day: 1.5 packs/day for 49.7 years (74.5 ttl pk-yrs)    Types: Cigarettes    Start date: 05/17/1974   Smokeless tobacco: Never  Vaping Use   Vaping status: Never Used  Substance Use Topics   Alcohol use: No    Alcohol/week: 0.0 standard drinks of alcohol   Drug use: No     Allergies   Codeine, Sulfa antibiotics, and Latex   Review of Systems Review of Systems Per HPI  Physical Exam Triage Vital Signs ED Triage Vitals  Encounter Vitals Group     BP 01/21/24 1214 132/74     Girls Systolic BP Percentile --      Girls Diastolic BP Percentile --      Boys Systolic BP Percentile --      Boys Diastolic BP Percentile --      Pulse Rate 01/21/24 1214 83     Resp 01/21/24 1214 18     Temp 01/21/24 1214 98.2 F (36.8 C)     Temp Source 01/21/24 1214 Oral     SpO2 01/21/24 1214 92 %     Weight --      Height --      Head Circumference --      Peak Flow --      Pain Score 01/21/24 1215 0     Pain Loc --      Pain Education --      Exclude from Growth Chart --    Orthostatic VS for the past 24 hrs:  BP- Lying Pulse- Lying BP- Sitting Pulse- Sitting BP- Standing at 0 minutes Pulse- Standing at 0 minutes  01/21/24 1304 151/72 86 121/70 84 111/69 89    Updated Vital Signs BP 132/74 (BP Location: Right Arm)   Pulse 83   Temp 98.2 F (36.8 C) (Oral)   Resp 18   SpO2 92%   Visual Acuity Right Eye Distance:   Left Eye Distance:   Bilateral Distance:    Right  Eye Near:   Left Eye Near:    Bilateral Near:     Physical Exam Vitals and nursing note reviewed.  Constitutional:      General: She is not in acute distress.    Appearance: Normal appearance.  HENT:     Head: Normocephalic.     Right Ear: Tympanic membrane, ear  canal and external ear normal.     Left Ear: Tympanic membrane, ear canal and external ear normal.     Nose: Nose normal.     Mouth/Throat:     Mouth: Mucous membranes are moist.  Eyes:     Extraocular Movements: Extraocular movements intact.     Conjunctiva/sclera: Conjunctivae normal.     Pupils: Pupils are equal, round, and reactive to light.  Cardiovascular:     Rate and Rhythm: Normal rate and regular rhythm.     Pulses: Normal pulses.     Heart sounds: Normal heart sounds.  Pulmonary:     Effort: Pulmonary effort is normal. No respiratory distress.     Breath sounds: Normal breath sounds. No stridor. No wheezing, rhonchi or rales.  Abdominal:     General: Bowel sounds are normal.     Palpations: Abdomen is soft.  Musculoskeletal:     Cervical back: Normal range of motion.  Skin:    General: Skin is warm and dry.  Neurological:     General: No focal deficit present.     Mental Status: She is alert and oriented to person, place, and time.  Psychiatric:        Mood and Affect: Mood normal.        Behavior: Behavior normal.      UC Treatments / Results  Labs (all labs ordered are listed, but only abnormal results are displayed) Labs Reviewed  POC SOFIA SARS ANTIGEN FIA    EKG: Normal sinus rhythm, no STEMI. Compared to EKGs dated 05/20/2023, 05/09/2023, and 11/28/2021.  Radiology DG Chest 2 View Result Date: 01/21/2024 EXAM: 2 VIEW(S) XRAY OF THE CHEST 01/21/2024 12:46:33 PM COMPARISON: 05/24/2023 CLINICAL HISTORY: shortness of breath, hx of COPD FINDINGS: LUNGS AND PLEURA: Linear scarring in left lower lung. No pulmonary edema. No pleural effusion. No pneumothorax. HEART AND MEDIASTINUM: Cardiomegaly, similar to prior exam. Aortic atherosclerosis. BONES AND SOFT TISSUES: Right upper quadrant cholecystectomy clips noted. Multilevel degenerative changes of thoracic spine. IMPRESSION: 1. No acute cardiopulmonary process. Electronically signed by: Norman Gatlin MD 01/21/2024  01:08 PM EST RP Workstation: [id number]    Procedures Procedures (including critical care time)  Medications Ordered in UC Medications  ipratropium-albuterol  (DUONEB) 0.5-2.5 (3) MG/3ML nebulizer solution 3 mL (3 mLs Nebulization Given 01/21/24 1307)    Initial Impression / Assessment and Plan / UC Course  I have reviewed the triage vital signs and the nursing notes.  Pertinent labs & imaging results that were available during my care of the patient were reviewed by me and considered in my medical decision making (see chart for details).  The COVID test was negative.  Chest x-ray was negative for acute cardiopulmonary process.  On exam, the patient's lung sounds are clear throughout, no wheezing, crackles, or rhonchi noted on exam.  She does report continued shortness of breath.  Difficult to determine cause of the patient's symptoms with no clear etiology.  Trial ambulation was performed, patient sats ranged between 91 to 94%.  Patient reports I am feeling better.  Will treat patient's shortness of breath with an albuterol  inhaler.  Patient advised to follow-up with  her primary care physician for reevaluation of her leg cramps.  Patient was also given strict ER follow-up precautions.  Supportive care recommendations were provided discussed with the patient to include smoking cessation and to monitor for worsening symptoms.  Patient was given strict ER follow-up precautions to include worsening shortness of breath, difficulty breathing, or other concerns.  Patient advised to follow-up with her PCP if symptoms fail to improve.  Patient was in agreement with this plan of care and verbalizes understanding.  All questions were answered.  Patient stable for discharge.   Final Clinical Impressions(s) / UC Diagnoses   Final diagnoses:  Shortness of breath  History of COPD     Discharge Instructions      The COVID test was negative. An albuterol  inhaler was prescribed for your shortness of  breath. You may take over-the-counter Tylenol  as needed for pain or discomfort. Recommend discontinuing smoking until your symptoms improve. Go to the emergency department if you experience continued shortness of breath, difficulty breathing, or develop new symptoms of chest pain or other concerns. Follow-up with your primary care physician if your symptoms fail to improve. Follow-up as needed.     ED Prescriptions     Medication Sig Dispense Auth. Provider   albuterol  (VENTOLIN  HFA) 108 (90 Base) MCG/ACT inhaler Inhale 2 puffs into the lungs every 6 (six) hours as needed. 8 g Leath-Warren, Etta PARAS, NP      PDMP not reviewed this encounter.   Gilmer Etta PARAS, NP 01/21/24 1330    Leath-Warren, Etta PARAS, NP 01/21/24 1335

## 2024-01-21 NOTE — ED Notes (Addendum)
 While performing amblatory test, pt SP02 ranged from 91-93%. Provider notified.

## 2024-01-25 ENCOUNTER — Other Ambulatory Visit: Payer: Self-pay | Admitting: Cardiology

## 2024-01-26 ENCOUNTER — Telehealth: Payer: Self-pay | Admitting: Cardiology

## 2024-01-26 NOTE — Telephone Encounter (Signed)
*  STAT* If patient is at the pharmacy, call can be transferred to refill team.   1. Which medications need to be refilled? (please list name of each medication and dose if known)   clopidogrel  (PLAVIX ) 75 MG tablet     2. Would you like to learn more about the convenience, safety, & potential cost savings by using the [facility]? No      3. Are you open to using the [facility] (Type [facility]. ). No    4. Which pharmacy/location (including street and city if local pharmacy) is medication to be sent to? [facility] [phone] - [place], [place] - [address] AT [facility] [address]     5. Do they need a 30 day or 90 day supply? 30 days

## 2024-01-27 ENCOUNTER — Inpatient Hospital Stay

## 2024-01-27 ENCOUNTER — Ambulatory Visit (HOSPITAL_COMMUNITY)

## 2024-01-27 MED ORDER — CLOPIDOGREL BISULFATE 75 MG PO TABS: 75.0000 mg | ORAL_TABLET | Freq: Every day | ORAL | 3 refills | Status: AC

## 2024-01-27 NOTE — Telephone Encounter (Signed)
 Pt is on plavix , rx refilled

## 2024-01-30 DIAGNOSIS — J441 Chronic obstructive pulmonary disease with (acute) exacerbation: Secondary | ICD-10-CM | POA: Diagnosis not present

## 2024-01-30 DIAGNOSIS — I2510 Atherosclerotic heart disease of native coronary artery without angina pectoris: Secondary | ICD-10-CM | POA: Diagnosis not present

## 2024-02-02 ENCOUNTER — Inpatient Hospital Stay: Admitting: Oncology

## 2024-02-06 ENCOUNTER — Other Ambulatory Visit (INDEPENDENT_AMBULATORY_CARE_PROVIDER_SITE_OTHER): Payer: Self-pay | Admitting: Gastroenterology

## 2024-02-06 ENCOUNTER — Other Ambulatory Visit (INDEPENDENT_AMBULATORY_CARE_PROVIDER_SITE_OTHER): Payer: Self-pay

## 2024-02-06 DIAGNOSIS — K219 Gastro-esophageal reflux disease without esophagitis: Secondary | ICD-10-CM

## 2024-02-06 MED ORDER — PANTOPRAZOLE SODIUM 40 MG PO TBEC: 40.0000 mg | DELAYED_RELEASE_TABLET | Freq: Every day | ORAL | 0 refills | Status: DC

## 2024-02-06 NOTE — Telephone Encounter (Signed)
 Already sent in #30 day supply today. Patient will need to keep upcoming appointment for further refills.

## 2024-02-06 NOTE — Telephone Encounter (Signed)
 Pt has OV to see Grace Hess on DEC 23 and is asking if we could call in her pantroprazole rx to [facility] on [address]. [phone]

## 2024-02-11 ENCOUNTER — Ambulatory Visit
Admission: EM | Admit: 2024-02-11 | Discharge: 2024-02-11 | Disposition: A | Discharge status: Home / Self Care | Attending: Family Medicine | Admitting: Family Medicine

## 2024-02-11 ENCOUNTER — Ambulatory Visit

## 2024-02-11 DIAGNOSIS — S2241XA Multiple fractures of ribs, right side, initial encounter for closed fracture: Secondary | ICD-10-CM | POA: Diagnosis not present

## 2024-02-11 DIAGNOSIS — W19XXXA Unspecified fall, initial encounter: Secondary | ICD-10-CM

## 2024-02-11 MED ORDER — TIZANIDINE HCL 2 MG PO CAPS: 2.0000 mg | ORAL_CAPSULE | Freq: Three times a day (TID) | ORAL | 0 refills | Status: AC | PRN

## 2024-02-11 MED ORDER — LIDOCAINE 5 % EX PTCH: 1.0000 | MEDICATED_PATCH | CUTANEOUS | 0 refills | Status: DC

## 2024-02-11 NOTE — ED Triage Notes (Signed)
 Pt reports fall x 3 days, right flank pain. Pt states she was sitting on the side of the bed dosed off and fell into the floor.

## 2024-02-11 NOTE — Discharge Instructions (Signed)
 You have fractured 3 ribs based on your x-ray report today.  In addition to the prescribed medications, you may use over-the-counter pain relievers, heat, rest.  Follow-up with primary care for recheck as needed.  Go to the emergency department if worsening symptoms at any time particularly difficulty breathing.

## 2024-02-12 NOTE — ED Provider Notes (Signed)
 [facility]    CSN: [id number] Arrival date & time: 02/11/24  1308      History   Chief Complaint No chief complaint on file.   HPI Grace Hess is a 68 y.o. female.   Patient presenting today with right anterior and lateral rib pain after a fall off the side of the bed 3 days ago.  She does note some bruising to the area but no difficulty breathing, dizziness, head injury, loss of consciousness, weakness numbness or tingling.  So far trying over-the-counter remedies with minimal relief.  Currently on chronic anticoagulation.    Past Medical History:  Diagnosis Date   Anxiety    CAD (coronary artery disease)    a. s/p DES to RCA in 07/2016 with residual mid-LAD stenosis   Cancer (HCC)    skin cancer on finger,, cancer left kidney   Cervicalgia    Chest pain, unspecified    Chronic kidney disease    Depression    GERD (gastroesophageal reflux disease)    Headache    bad ones after my stroke in 2016; only have them when I get bad news now (08/02/2016)   Heart murmur    Heavy smoker (more than 20 cigarettes per day)    Scheduled for LDCT screening 02/11/15   History of hiatal hernia    History of kidney stones    no cysto/OR (12/10/2014)   Hypercholesteremia    Hypertension    Incarcerated incisional hernia 08/05/2022   Left renal mass    Obesity    Palpitations    Reflux esophagitis    Stroke [facility])    they said I had a  MINI stroke a few months back/MRI (12/10/2014)   TIA (transient ischemic attack) 11/28/2014   thelbert 11/28/2014   Type II diabetes mellitus (HCC)     Patient Active Problem List   Diagnosis Date Noted   Iron deficiency anemia 01/18/2023   Diarrhea 11/02/2022   Renal cell adenocarcinoma (HCC) 11/25/2021   Renal cell cancer, left (HCC) 10/01/2021   Vitamin D  deficiency 08/23/2019   Loss of weight 07/17/2019   PAD (peripheral artery disease) 07/04/2019   IBS (irritable bowel syndrome) 01/16/2019   Uncontrolled type 2  diabetes mellitus with hyperglycemia, without long-term current use of insulin  (HCC) 01/08/2019   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) 12/21/2018   Lumbar back pain 12/21/2018   Mixed hyperlipidemia 12/21/2018   CAD S/P percutaneous coronary angioplasty 08/03/2016   Claudication 08/02/2016   Stable angina 08/02/2016   Heavy smoker (more than 20 cigarettes per day)    Headache 12/10/2014   Carotid artery disease 12/01/2014   Tobacco use disorder 11/28/2014   HYPERCHOLESTEROLEMIA  IIA 09/13/2008   Depression with anxiety 09/13/2008   Essential hypertension, benign 09/13/2008   GERD (gastroesophageal reflux disease) 09/13/2008    Past Surgical History:  Procedure Laterality Date   ABDOMINAL AORTOGRAM N/A 08/02/2016   Procedure: Abdominal Aortogram;  Surgeon: Mady Bruckner, MD;  [facility]: [facility];  Service: Cardiovascular;  Laterality: N/A;   CAROTID STENT     CHOLECYSTECTOMY OPEN  1978   COLONOSCOPY  2010   single diverticulum in sigmoid colon   CORONARY ANGIOPLASTY WITH STENT PLACEMENT  08/02/2016   to RCA   CORONARY STENT INTERVENTION N/A 08/02/2016   Procedure: Coronary Stent Intervention;  Surgeon: Mady Bruckner, MD;  Location: [facility];  Service: Cardiovascular;  Laterality: N/A;  RCA   CYSTOSCOPY  W/ RETROGRADES Bilateral 07/15/2021   Procedure: CYSTOSCOPY WITH RETROGRADE PYELOGRAM;  Surgeon: Roseann Adine PARAS., MD;  Location: [facility];  Service: Urology;  Laterality: Bilateral;   CYSTOSCOPY WITH STENT PLACEMENT Left 07/15/2021   Procedure: CYSTOSCOPY WITH STENT PLACEMENT;  Surgeon: Roseann Adine PARAS., MD;  Location: [facility];  Service: Urology;  Laterality: Left;   ENDARTERECTOMY Left 12/02/2014   Procedure: ENDARTERECTOMY CAROTID;  Surgeon: Krystal JULIANNA Doing, MD;  Location: [facility] OR;  Service: Vascular;  Laterality: Left;   HERNIA REPAIR     LAPAROSCOPIC NEPHRECTOMY Left    LAPAROSCOPIC NEPHRECTOMY Left  11/25/2021   Procedure: LEFT LAPAROSCOPIC RADICAL NEPHRECTOMY;  Surgeon: Cam Morene ORN, MD;  Location: [facility];  Service: Urology;  Laterality: Left;   LEFT HEART CATH AND CORONARY ANGIOGRAPHY N/A 08/02/2016   Procedure: Left Heart Cath and Coronary Angiography;  Surgeon: Mady Bruckner, MD;  Location: [facility];  Service: Cardiovascular;  Laterality: N/A;   LOWER EXTREMITY ANGIOGRAM  08/02/2016   LOWER EXTREMITY ANGIOGRAPHY N/A 08/02/2016   Procedure: Lower Extremity Angiography;  Surgeon: Mady Bruckner, MD;  Location: [facility];  Service: Cardiovascular;  Laterality: N/A;   NEPHRECTOMY Left    VAGINAL HYSTERECTOMY  1991   partial   VENTRAL HERNIA REPAIR N/A 08/05/2022   Procedure: LAPAROSCOPIC VENTRAL WALL HERNIA REPAIR WITH MESH;  Surgeon: Sheldon Standing, MD;  Location: [facility];  Service: General;  Laterality: N/A;    OB History   No obstetric history on file.      Home Medications    Prior to Admission medications   Medication Sig Start Date End Date Taking? Authorizing Provider  lidocaine  (LIDODERM ) 5 % Place 1 patch onto the skin daily. Remove & Discard patch within 12 hours or as directed by MD 02/11/24  Yes Stuart Vernell Norris, PA-C  tizanidine (ZANAFLEX) 2 MG capsule Take 1 capsule (2 mg total) by mouth 3 (three) times daily as needed for muscle spasms. Do not drink alcohol or drive while taking this medication.  May cause drowsiness. 02/11/24  Yes Stuart Vernell Norris, PA-C  acetaminophen  (TYLENOL ) 325 MG tablet Take 2 tablets (650 mg total) by mouth every 4 (four) hours as needed for headache or mild pain. 08/03/16   Maxie Herlene POUR, PA-C  albuterol  (PROVENTIL ) (2.5 MG/3ML) 0.083% nebulizer solution Take 3 mLs (2.5 mg total) by nebulization every 6 (six) hours as needed for wheezing or shortness of breath. 01/21/24   Leath-Warren, Etta PARAS, NP  albuterol  (VENTOLIN  HFA) 108 (90 Base) MCG/ACT inhaler Inhale 2 puffs into the lungs every 6 (six)  hours as needed. 01/21/24   Leath-Warren, Etta PARAS, NP  ALPRAZolam  (XANAX ) 0.5 MG tablet Take 0.25 mg by mouth 2 (two) times daily. 03/20/19   [provider]  atorvastatin  (LIPITOR ) 40 MG tablet TAKE 1 TABLET(40 MG) BY MOUTH DAILY 01/27/24   Alvan Dorn JULIANNA, MD  Budeson-Glycopyrrol-Formoterol  (BREZTRI AEROSPHERE) 160-9-4.8 MCG/ACT AERO Inhale into the lungs. 1-2 times per day    [provider]  Cholecalciferol (VITAMIN D3) 125 MCG (5000 UT) CAPS Take 5,000 Units by mouth once a week. Tuesday    [provider]  clopidogrel  (PLAVIX ) 75 MG tablet Take 1 tablet (75 mg total) by mouth daily. 01/27/24   Alvan Dorn JULIANNA, MD  escitalopram  (LEXAPRO ) 20 MG tablet Take 20 mg by mouth daily. 01/01/21   [provider]  gabapentin  (NEURONTIN ) 300 MG capsule Take 300 mg by mouth every other day. At night    [provider]  glucose blood (ACCU-CHEK GUIDE) test strip Use as instructed 08/23/19   Nida, Gebreselassie W, MD  insulin  degludec (TRESIBA  FLEXTOUCH) 100 UNIT/ML FlexTouch Pen Inject 20 Units into the skin at bedtime. 01/13/24   Therisa Benton PARAS, NP  Insulin  Pen Needle (PEN NEEDLES) 31G X 6 MM MISC Use to inject insulin  once daily 01/13/24   Therisa Benton PARAS, NP  LOKELMA  10 g PACK packet Take 10 g by mouth See admin instructions. Five times a week 07/14/22   [provider]  magnesium  gluconate (MAGONATE) 500 (27 Mg) MG TABS tablet Take 100 mg by mouth in the morning and at bedtime.    [provider]  nystatin cream (MYCOSTATIN) Apply 1 application  topically 2 (two) times daily as needed (Itching). 07/10/16   [provider]  ondansetron  (ZOFRAN ) 4 MG tablet Take 1 tablet (4 mg total) by mouth every 6 (six) hours as needed for nausea or vomiting. 05/25/23   Laurence Locus, DO  oxyCODONE -acetaminophen  (PERCOCET/ROXICET) 5-325 MG tablet Take 1 tablet by mouth every 6 (six) hours as needed for severe pain or moderate pain. 08/05/22    Sheldon Standing, MD  pantoprazole  (PROTONIX ) 40 MG tablet Take 1 tablet (40 mg total) by mouth daily. Must keep upcoming appt for further refills. 02/06/24   Carlan, Mitzie CROME, NP  sodium bicarbonate  650 MG tablet Take 650 mg by mouth 2 (two) times daily. 06/14/22   [provider]    Family History Family History  Problem Relation Age of Onset   Congestive Heart Failure Mother    COPD Father    Cancer Brother    Cancer Brother    Breast cancer Neg Hx     Social History Social History   Tobacco Use   Smoking status: Every Day    Current packs/day: 1.50    Average packs/day: 1.5 packs/day for 49.7 years (74.6 ttl pk-yrs)    Types: Cigarettes    Start date: 05/17/1974   Smokeless tobacco: Never  Vaping Use   Vaping status: Never Used  Substance Use Topics   Alcohol use: No    Alcohol/week: 0.0 standard drinks of alcohol   Drug use: No     Allergies   Codeine, Sulfa antibiotics, and Latex   Review of Systems Review of Systems Per HPI  Physical Exam Triage Vital Signs ED Triage Vitals  Encounter Vitals Group     BP 02/11/24 1340 (!) 172/66     Girls Systolic BP Percentile --      Girls Diastolic BP Percentile --      Boys Systolic BP Percentile --      Boys Diastolic BP Percentile --      Pulse Rate 02/11/24 1340 77     Resp 02/11/24 1340 18     Temp 02/11/24 1340 98.7 F (37.1 C)     Temp Source 02/11/24 1340 Oral     SpO2 02/11/24 1340 93 %     Weight --      Height --      Head Circumference --      Peak Flow --      Pain Score 02/11/24 1346 10     Pain Loc --      Pain Education --      Exclude from Growth Chart --    No data found.  Updated Vital Signs BP (!) 172/66 (BP Location: Right Arm)   Pulse 77   Temp 98.7 F (37.1 C) (Oral)  Resp 18   SpO2 93%   Visual Acuity Right Eye Distance:   Left Eye Distance:   Bilateral Distance:    Right Eye Near:   Left Eye Near:    Bilateral Near:     Physical Exam Vitals and nursing note  reviewed.  Constitutional:      Appearance: Normal appearance. She is not ill-appearing.  HENT:     Head: Atraumatic.     Mouth/Throat:     Mouth: Mucous membranes are moist.  Eyes:     Extraocular Movements: Extraocular movements intact.     Conjunctiva/sclera: Conjunctivae normal.  Cardiovascular:     Rate and Rhythm: Normal rate.  Pulmonary:     Effort: Pulmonary effort is normal. No respiratory distress.     Breath sounds: Normal breath sounds. No wheezing or rales.     Comments: Chest rise symmetric b/l Musculoskeletal:        General: Tenderness and signs of injury present. Normal range of motion.     Cervical back: Normal range of motion and neck supple.  Skin:    General: Skin is warm and dry.     Findings: Bruising present.     Comments: Right anterior rib region slightly bruised, tender to palpation to the anterior and lateral right ribs.  Chest rise symmetric bilaterally  Neurological:     Mental Status: She is alert and oriented to person, place, and time.  Psychiatric:        Mood and Affect: Mood normal.        Thought Content: Thought content normal.        Judgment: Judgment normal.      UC Treatments / Results  Labs (all labs ordered are listed, but only abnormal results are displayed) Labs Reviewed - No data to display  EKG   Radiology DG Ribs Unilateral W/Chest Right Result Date: 02/11/2024 EXAM: 1 AP VIEW XRAY OF THE RIGHT RIBS AND CHEST 02/11/2024 01:57:55 PM COMPARISON: 01/21/2024. CLINICAL HISTORY: right flank pain since fall x3 days. FINDINGS: BONES: Mildly displaced fractures are seen involving the anterior portions of the right seventh and eighth ribs. A mildly displaced fracture involving the lateral portion of the right fifth rib is noted. LUNGS AND PLEURA: No consolidation or pulmonary edema. No pleural effusion or pneumothorax. HEART AND MEDIASTINUM: No acute abnormality of the cardiac and mediastinal silhouettes. IMPRESSION: 1. Mildly  displaced fractures of the anterior right seventh and eighth ribs, and the lateral right fifth rib. Electronically signed by: Lynwood Seip MD 02/11/2024 02:20 PM EST RP Workstation: [id number]    Procedures Procedures (including critical care time)  Medications Ordered in UC Medications - No data to display  Initial Impression / Assessment and Plan / UC Course  I have reviewed the triage vital signs and the nursing notes.  Pertinent labs & imaging results that were available during my care of the patient were reviewed by me and considered in my medical decision making (see chart for details).     Hypertensive in triage otherwise vital signs reassuring.  Chest x-ray today revealing 3 rib fractures, 2 anterior and 1 lateral on the right.  Oxygen  saturation reassuring, she is well-appearing and in no acute distress.  Will treat with lidocaine  patches, Zanaflex, heat, massage, rest.  Return for worsening or unresolving symptoms.  Final Clinical Impressions(s) / UC Diagnoses   Final diagnoses:  Closed fracture of multiple ribs of right side, initial encounter  Fall, initial encounter     Discharge Instructions  You have fractured 3 ribs based on your x-ray report today.  In addition to the prescribed medications, you may use over-the-counter pain relievers, heat, rest.  Follow-up with primary care for recheck as needed.  Go to the emergency department if worsening symptoms at any time particularly difficulty breathing.    ED Prescriptions     Medication Sig Dispense Auth. Provider   lidocaine  (LIDODERM ) 5 % Place 1 patch onto the skin daily. Remove & Discard patch within 12 hours or as directed by MD 30 patch Stuart Vernell Norris, PA-C   tizanidine (ZANAFLEX) 2 MG capsule Take 1 capsule (2 mg total) by mouth 3 (three) times daily as needed for muscle spasms. Do not drink alcohol or drive while taking this medication.  May cause drowsiness. 15 capsule Stuart Vernell Norris, [place]       PDMP not reviewed this encounter.   Stuart Vernell Norris, [place] 02/12/24 1453

## 2024-02-13 ENCOUNTER — Other Ambulatory Visit: Payer: Self-pay | Admitting: Cardiology

## 2024-02-16 ENCOUNTER — Inpatient Hospital Stay: Attending: Oncology

## 2024-02-16 ENCOUNTER — Ambulatory Visit (HOSPITAL_COMMUNITY)
Admission: RE | Admit: 2024-02-16 | Discharge: 2024-02-16 | Disposition: A | Source: Ambulatory Visit | Attending: Hematology | Admitting: Hematology

## 2024-02-16 DIAGNOSIS — C642 Malignant neoplasm of left kidney, except renal pelvis: Secondary | ICD-10-CM | POA: Diagnosis present

## 2024-02-16 DIAGNOSIS — D509 Iron deficiency anemia, unspecified: Secondary | ICD-10-CM

## 2024-02-16 DIAGNOSIS — S2241XD Multiple fractures of ribs, right side, subsequent encounter for fracture with routine healing: Secondary | ICD-10-CM | POA: Diagnosis not present

## 2024-02-16 DIAGNOSIS — D508 Other iron deficiency anemias: Secondary | ICD-10-CM | POA: Diagnosis not present

## 2024-02-16 DIAGNOSIS — F17200 Nicotine dependence, unspecified, uncomplicated: Secondary | ICD-10-CM | POA: Diagnosis not present

## 2024-02-16 DIAGNOSIS — J069 Acute upper respiratory infection, unspecified: Secondary | ICD-10-CM | POA: Diagnosis not present

## 2024-02-16 DIAGNOSIS — N200 Calculus of kidney: Secondary | ICD-10-CM | POA: Diagnosis not present

## 2024-02-16 DIAGNOSIS — Z905 Acquired absence of kidney: Secondary | ICD-10-CM | POA: Diagnosis not present

## 2024-02-16 DIAGNOSIS — X58XXXA Exposure to other specified factors, initial encounter: Secondary | ICD-10-CM | POA: Diagnosis not present

## 2024-02-16 DIAGNOSIS — K5710 Diverticulosis of small intestine without perforation or abscess without bleeding: Secondary | ICD-10-CM | POA: Diagnosis not present

## 2024-02-16 LAB — COMPREHENSIVE METABOLIC PANEL WITH GFR
ALT: 9 U/L (ref 0–44)
AST: 18 U/L (ref 15–41)
Albumin: 3.4 g/dL — ABNORMAL LOW (ref 3.5–5.0)
Alkaline Phosphatase: 131 U/L — ABNORMAL HIGH (ref 38–126)
Anion gap: 16 — ABNORMAL HIGH (ref 5–15)
BUN: 63 mg/dL — ABNORMAL HIGH (ref 8–23)
CO2: 20 mmol/L — ABNORMAL LOW (ref 22–32)
Calcium: 8.5 mg/dL — ABNORMAL LOW (ref 8.9–10.3)
Chloride: 104 mmol/L (ref 98–111)
Creatinine, Ser: 3.63 mg/dL — ABNORMAL HIGH (ref 0.44–1.00)
GFR, Estimated: 13 mL/min — ABNORMAL LOW (ref >60)
Glucose, Bld: 291 mg/dL — ABNORMAL HIGH (ref 70–99)
Potassium: 4.3 mmol/L (ref 3.5–5.1)
Sodium: 140 mmol/L (ref 135–145)
Total Bilirubin: 0.2 mg/dL (ref 0.0–1.2)
Total Protein: 6.2 g/dL — ABNORMAL LOW (ref 6.5–8.1)

## 2024-02-16 LAB — CBC WITH DIFFERENTIAL/PLATELET
Abs Immature Granulocytes: 0.04 K/uL (ref 0.00–0.07)
Basophils Absolute: 0.1 K/uL (ref 0.0–0.1)
Basophils Relative: 1 %
Eosinophils Absolute: 0.2 K/uL (ref 0.0–0.5)
Eosinophils Relative: 2 %
HCT: 33.3 % — ABNORMAL LOW (ref 36.0–46.0)
Hemoglobin: 10.5 g/dL — ABNORMAL LOW (ref 12.0–15.0)
Immature Granulocytes: 0 %
Lymphocytes Relative: 11 %
Lymphs Abs: 1.2 K/uL (ref 0.7–4.0)
MCH: 29.2 pg (ref 26.0–34.0)
MCHC: 31.5 g/dL (ref 30.0–36.0)
MCV: 92.8 fL (ref 80.0–100.0)
Monocytes Absolute: 0.9 K/uL (ref 0.1–1.0)
Monocytes Relative: 8 %
Neutro Abs: 8.8 K/uL — ABNORMAL HIGH (ref 1.7–7.7)
Neutrophils Relative %: 78 %
Platelets: 354 K/uL (ref 150–400)
RBC: 3.59 MIL/uL — ABNORMAL LOW (ref 3.87–5.11)
RDW: 14.5 % (ref 11.5–15.5)
WBC: 11.2 K/uL — ABNORMAL HIGH (ref 4.0–10.5)
nRBC: 0 % (ref 0.0–0.2)

## 2024-02-16 LAB — IRON AND TIBC
Iron: 29 ug/dL (ref 28–170)
Saturation Ratios: 16 % (ref 10.4–31.8)
TIBC: 176 ug/dL — ABNORMAL LOW (ref 250–450)
UIBC: 148 ug/dL

## 2024-02-16 LAB — FERRITIN: Ferritin: 354 ng/mL — ABNORMAL HIGH (ref 11–307)

## 2024-02-23 DIAGNOSIS — N2581 Secondary hyperparathyroidism of renal origin: Secondary | ICD-10-CM | POA: Diagnosis not present

## 2024-02-23 DIAGNOSIS — E1159 Type 2 diabetes mellitus with other circulatory complications: Secondary | ICD-10-CM | POA: Diagnosis not present

## 2024-02-24 LAB — LAB REPORT - SCANNED
A1c: 8.5
Albumin, Urine POC: 984.8
Calcium: 9.1
Creatinine, POC: 66.8 mg/dL
EGFR: 16
Free T4: 1.35 ng/dL
Microalb Creat Ratio: 1474
PTH: 111
TSH: 1.33 (ref 0.41–5.90)

## 2024-02-29 ENCOUNTER — Inpatient Hospital Stay: Admitting: Oncology

## 2024-02-29 VITALS — BP 113/57 | HR 80 | Temp 97.7°F | Resp 18 | Ht 65.0 in | Wt 140.0 lb

## 2024-02-29 DIAGNOSIS — J90 Pleural effusion, not elsewhere classified: Secondary | ICD-10-CM | POA: Diagnosis not present

## 2024-02-29 DIAGNOSIS — C642 Malignant neoplasm of left kidney, except renal pelvis: Secondary | ICD-10-CM

## 2024-02-29 DIAGNOSIS — D508 Other iron deficiency anemias: Secondary | ICD-10-CM | POA: Diagnosis not present

## 2024-02-29 NOTE — Progress Notes (Signed)
 [facility] OFFICE PROGRESS NOTE  Grace Norleen PEDLAR, MD  ASSESSMENT & PLAN:  Assessment & Plan Renal cell cancer, left [facility]) - She denies any new onset pains or hematuria. - Labs from 02/16/2024 shows a stable elevated creatinine, potassium 4.3 with stable LFTs.  CBC shows mild anemia hemoglobin 10.5 and mild leukocytosis with an elevated white count of 11.2. -CT abdomen pelvis from 02/16/2024 shows stable exam.  No new or progressive findings to suggest recurrent or metastatic disease in the abdomen or pelvis.  Small right pleural effusion with right base collapse consolidation new since prior.  Nonobstructing right renal stone and aortic arthrosclerosis. - Recommend follow-up in 6 months with repeat CTAP without IV contrast.  Other iron deficiency anemia - Combination anemia from CKD and functional iron deficiency. - Monoferric  1 g was given on 01/21/2023. - Iron panel today shows iron saturation 16% with a low TIBC.  Hemoglobin 10.5 and ferritin 354. - Continue iron tablet 3 times a week.  Will repeat ferritin and iron panel in 3 months if no improvement, would recommend IV iron.  Patient was in agreement. Pleural effusion -Discovered incidentally on recent CT scan for follow-up for renal cell carcinoma. -Patient is an active smoker. -She has never establish care with pulmonology. -Previous prescriptions for her breathing such as inhalers were provided by her pharmacist and MD at [facility].  She is running low on her albuterol  and Breztri.  -Will refer to pulmonology   Orders Placed This Encounter  Procedures   Ferritin    Standing Status:   Future    Expected Date:   09/20/2024    Expiration Date:   02/28/2025   Iron and TIBC ([facility] ONLY)    Standing Status:   Future    Expected Date:   09/20/2024    Expiration Date:   02/28/2025   Comprehensive metabolic panel    Standing Status:   Future    Expected Date:   09/20/2024    Expiration Date:   02/28/2025    CBC with Differential    Standing Status:   Future    Expected Date:   09/20/2024    Expiration Date:   02/28/2025   Ambulatory referral to Pulmonology    Referral Priority:   Routine    Referral Type:   Consultation    Referral Reason:   Specialty Services Required    Requested Specialty:   Pulmonary Disease    Number of Visits Requested:   1    INTERVAL HISTORY: Patient returns for follow-up for renal cell carcinoma and iron deficiency anemia.  Patient recently was seen at urgent care here in [place] for right anterior and lateral rib and after she fell off the side of the bed on 02/11/2024.  Reports she still has pain to her right side especially when she coughs.  She was also evaluated on 01/21/2024 for shortness of breath without really any acute findings.  She was instructed to follow-up with her PCP.  Overall, patient reports she is doing well.  She has a cough at times.  Appetite 75% energy levels are 25%.  Reports previously she was getting her inhalers through her PCP and pharmacist at [facility] but no longer has this option as they have close that practice.  Reports she is running low on both of her inhalers.  We reviewed CBC, CMP, iron panel and ferritin.  SUMMARY OF HEMATOLOGIC HISTORY: Oncology History  Renal cell cancer, left (HCC)  10/01/2021 Initial Diagnosis  Renal cell cancer, left (HCC)   10/01/2021 Cancer Staging   Staging form: Kidney, AJCC 8th Edition - Clinical stage from 10/01/2021: Stage I (cT1a, cN0, cM0) - Signed by Rogers Hai, MD on 12/22/2021 Histopathologic type: Clear cell adenocarcinoma, NOS Stage prefix: Initial diagnosis Histologic grade (G): G4 Histologic grading system: 4 grade system Histologic sub-type: Clear cell Sarcomatoid features: Absent Rhabdoid features: Present Tumor necrosis: Absent    1. 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  Moderate to marked severity left-sided hydronephrosis.  Numerous bilateral nonobstructing renal calculi. - MRI abdomen (08/14/2021): Central lower pole left kidney 4 x 3.7 x 5.3 cm enhancing mass, extension into the proximal left ureter.  No renal vein involvement or evidence of abdominal metastatic disease.  Multiple small abdominal retroperitoneal lymph nodes, nonpathologic by size criteria.  Right abdominal wall subcutaneous nodule nonspecific but new since 07/13/2021. - Left kidney biopsy (09/10/2021): Clear-cell renal cell carcinoma, nuclear grade 2.  No tumor necrosis. - Left nephrectomy on 11/25/2021: Pathology 3.2 cm clear-cell renal cell carcinoma, grade 4, negative sarcoid features, focal rhabdoid features, tumor necrosis negative.  PT1APN0.   2. Social/family history: - She lives at home with her husband.  She retired after working in [employer].  She is current active smoker, 2 packs/day for 48 years. - Brother had throat cancer and was smoker.  Maternal uncle died of cancer.  Paternal uncle also had cancer.  Maternal grandmother had lung cancer.    CBC    Component Value Date/Time   WBC 11.2 (H) 02/16/2024 0846   RBC 3.59 (L) 02/16/2024 0846   HGB 10.5 (L) 02/16/2024 0846   HCT 33.3 (L) 02/16/2024 0846   PLT 354 02/16/2024 0846   MCV 92.8 02/16/2024 0846   MCH 29.2 02/16/2024 0846   MCHC 31.5 02/16/2024 0846   RDW 14.5 02/16/2024 0846   LYMPHSABS 1.2 02/16/2024 0846   MONOABS 0.9 02/16/2024 0846   EOSABS 0.2 02/16/2024 0846   BASOSABS 0.1 02/16/2024 0846       Latest Ref Rng & Units 02/16/2024    8:46 AM 10/05/2023   12:00 AM 07/18/2023   12:12 PM  CMP  Glucose 70 - 99 mg/dL 708   893   BUN 8 - 23 mg/dL 63  46     51   Creatinine 0.44 - 1.00 mg/dL 6.36  3.1     7.06   Sodium 135 - 145 mmol/L 140   135   Potassium 3.5 - 5.1 mmol/L 4.3   5.2    Chloride 98 - 111 mmol/L 104   103   CO2 22 - 32 mmol/L 20   19   Calcium  8.9 - 10.3 mg/dL 8.5   9.1   Total Protein 6.5 - 8.1 g/dL 6.2   7.1   Total Bilirubin 0.0 - 1.2 mg/dL 0.2   0.4   Alkaline Phos 38 - 126 U/L 131   63   AST 15 - 41 U/L 18   15   ALT 0 - 44 U/L 9   15      This result is from an external source.     Lab Results  Component Value Date   FERRITIN 354 (H) 02/16/2024    Vitals:   02/29/24 1033  BP: (!) 113/57  Pulse: 80  Resp: 18  Temp: 97.7 F (36.5 C)  SpO2: 91%    Review of System:  Review of Systems  Constitutional:  Positive for malaise/fatigue.  Respiratory:  Positive for cough.   Gastrointestinal:  Positive for nausea.    Physical Exam: Physical Exam Constitutional:      Appearance: Normal appearance.  HENT:     Head: Normocephalic and atraumatic.  Eyes:     Pupils: Pupils are equal, round, and reactive to light.  Cardiovascular:     Rate and Rhythm: Normal rate and regular rhythm.     Heart sounds: Normal heart sounds. No murmur heard. Pulmonary:     Effort: Pulmonary effort is normal.     Breath sounds: Normal breath sounds. No wheezing.  Abdominal:     General: Bowel sounds are normal. There is no distension.     Palpations: Abdomen is soft.     Tenderness: There is no abdominal tenderness.  Musculoskeletal:        General: Normal range of motion.     Cervical back: Normal range of motion.  Skin:    General: Skin is warm and dry.     Findings: No rash.  Neurological:     Mental Status: She is alert and oriented to person, place, and time.     Gait: Gait is intact.  Psychiatric:        Mood and Affect: Mood and affect normal.        Cognition and Memory: Memory normal.        Judgment: Judgment normal.      I spent 20 minutes dedicated to the care of this patient (face-to-face and non-face-to-face) on the date of the encounter to include what is described in the assessment and plan.,  Delon Hope, NP 02/29/2024  3:35 PM

## 2024-02-29 NOTE — Assessment & Plan Note (Addendum)
-   She denies any new onset pains or hematuria. - Labs from 02/16/2024 shows a stable elevated creatinine, potassium 4.3 with stable LFTs.  CBC shows mild anemia hemoglobin 10.5 and mild leukocytosis with an elevated white count of 11.2. -CT abdomen pelvis from 02/16/2024 shows stable exam.  No new or progressive findings to suggest recurrent or metastatic disease in the abdomen or pelvis.  Small right pleural effusion with right base collapse consolidation new since prior.  Nonobstructing right renal stone and aortic arthrosclerosis. - Recommend follow-up in 6 months with repeat CTAP without IV contrast.

## 2024-02-29 NOTE — Assessment & Plan Note (Addendum)
-   Combination anemia from CKD and functional iron deficiency. - Monoferric  1 g was given on 01/21/2023. - Iron panel today shows iron saturation 16% with a low TIBC.  Hemoglobin 10.5 and ferritin 354. - Continue iron tablet 3 times a week.  Will repeat ferritin and iron panel in 3 months if no improvement, would recommend IV iron.  Patient was in agreement.

## 2024-02-29 NOTE — Assessment & Plan Note (Deleted)
-   Combination anemia from CKD and functional iron deficiency. - Monoferric  1 g was given on 01/21/2023. - Iron panel today is 156 with percent saturation 32.  Hemoglobin is 11.5. - Continue iron tablet 3 times a week.  Will repeat ferritin and iron panel in 6 months.

## 2024-03-06 ENCOUNTER — Other Ambulatory Visit (INDEPENDENT_AMBULATORY_CARE_PROVIDER_SITE_OTHER): Payer: Self-pay | Admitting: Gastroenterology

## 2024-03-06 ENCOUNTER — Ambulatory Visit (INDEPENDENT_AMBULATORY_CARE_PROVIDER_SITE_OTHER): Admitting: Gastroenterology

## 2024-03-06 DIAGNOSIS — K219 Gastro-esophageal reflux disease without esophagitis: Secondary | ICD-10-CM

## 2024-03-06 NOTE — Telephone Encounter (Signed)
 Last seen 10/2022. Needs office visit for further refills.

## 2024-03-07 ENCOUNTER — Ambulatory Visit (INDEPENDENT_AMBULATORY_CARE_PROVIDER_SITE_OTHER): Admitting: Gastroenterology

## 2024-03-07 ENCOUNTER — Encounter (INDEPENDENT_AMBULATORY_CARE_PROVIDER_SITE_OTHER): Payer: Self-pay | Admitting: Gastroenterology

## 2024-03-07 ENCOUNTER — Other Ambulatory Visit (INDEPENDENT_AMBULATORY_CARE_PROVIDER_SITE_OTHER): Payer: Self-pay | Admitting: Gastroenterology

## 2024-03-07 ENCOUNTER — Telehealth (INDEPENDENT_AMBULATORY_CARE_PROVIDER_SITE_OTHER): Payer: Self-pay

## 2024-03-07 VITALS — BP 167/95 | HR 80 | Temp 97.1°F | Ht 65.0 in | Wt 139.0 lb

## 2024-03-07 DIAGNOSIS — D500 Iron deficiency anemia secondary to blood loss (chronic): Secondary | ICD-10-CM

## 2024-03-07 DIAGNOSIS — K219 Gastro-esophageal reflux disease without esophagitis: Secondary | ICD-10-CM

## 2024-03-07 DIAGNOSIS — D509 Iron deficiency anemia, unspecified: Secondary | ICD-10-CM | POA: Diagnosis not present

## 2024-03-07 MED ORDER — PANTOPRAZOLE SODIUM 40 MG PO TBEC: 40.0000 mg | DELAYED_RELEASE_TABLET | Freq: Every day | ORAL | 0 refills | Status: DC

## 2024-03-07 NOTE — Telephone Encounter (Signed)
" ° ° °  Primary Cardiologist:Branch, Dorn, MD  Chart reviewed as part of pre-operative protocol coverage. Because of [facility]'s past medical history and time since last visit, he/she will require a follow-up office visit in order to better assess preoperative cardiovascular risk.  Pre-op covering staff: - Please schedule appointment and call patient to inform them. - Please contact requesting surgeon's office via preferred method (i.e, phone, fax) to inform them of need for appointment prior to surgery.  If applicable, this message will also be routed to pharmacy pool and/or primary cardiologist for input on holding anticoagulant/antiplatelet agent as requested below so that this information is available at time of patient's appointment.   [name], NP  03/07/2024, 10:57 AM   "

## 2024-03-07 NOTE — Progress Notes (Signed)
 Emslee Lopezmartinez Faizan Fatimata Talsma , M.D. Gastroenterology & Hepatology [facility] Gastroenterology [address] [place], [place] [address] Primary Care Physician: Shona Norleen PEDLAR, MD [address] [place] [address]  Chief Complaint: GERD iron deficiency anemia  History of Present Illness: Grace Hess is a 68 y.o. female with nxiety, coronary artery disease status post stent placement, GERD, hypertension, hyperlipidemia, obesity, stroke, diabetes CKD who presents for evaluation of GERD and iron deficiency anemia  Patient was last seen 10/2022 for GERD and was also recommended colonoscopy which she deferred Currently patient does not have any active complaints.  Questionable history of perianal abscess treated with antibiotics.  Patient reports GERD is well-controlled with Protonix  daily The patient denies having any nausea, vomiting, fever, chills, hematochezia, melena, hematemesis, abdominal distention, abdominal pain, diarrhea, jaundice, pruritus or weight loss.  Last ZHI:wnwz Last Colonoscopy: Mar 31, 2009 single diverticulum in sigmoid   FHx: Son had Crohn's disease and passed away from  Labs from 2024/03/31 creatinine 3.6 ALP 131 normal AST ALT hemoglobin 10.5 Iron profile with saturation 16 ferritin 354 previous ferritin 32 in 04/01/2023 Past Medical History: Past Medical History:  Diagnosis Date   Anxiety    CAD (coronary artery disease)    a. s/p DES to RCA in 07/2016 with residual mid-LAD stenosis   Cancer (HCC)    skin cancer on finger,, cancer left kidney   Cervicalgia    Chest pain, unspecified    Chronic kidney disease    Depression    GERD (gastroesophageal reflux disease)    Headache    bad ones after my stroke in April 01, 2015; only have them when I get bad news now (08/02/2016)   Heart murmur    Heavy smoker (more than 20 cigarettes per day)    Scheduled for LDCT screening 02/11/15   History of hiatal hernia    History of kidney stones    no cysto/OR  (12/10/2014)   Hypercholesteremia    Hypertension    Incarcerated incisional hernia 08/05/2022   Left renal mass    Obesity    Palpitations    Reflux esophagitis    Stroke [facility])    they said I had a  MINI stroke a few months back/MRI (12/10/2014)   TIA (transient ischemic attack) 11/28/2014   thelbert 11/28/2014   Type II diabetes mellitus (HCC)     Past Surgical History: Past Surgical History:  Procedure Laterality Date   ABDOMINAL AORTOGRAM N/A 08/02/2016   Procedure: Abdominal Aortogram;  Surgeon: Mady Bruckner, MD;  [facility]: [facility] INV[facility]IVE CV LAB;  Service: Cardiovascular;  Laterality: N/A;   CAROTID STENT     CHOLECYSTECTOMY OPEN  1978   COLONOSCOPY  Mar 31, 2009   single diverticulum in sigmoid colon   CORONARY ANGIOPLASTY WITH STENT PLACEMENT  08/02/2016   to RCA   CORONARY STENT INTERVENTION N/A 08/02/2016   Procedure: Coronary Stent Intervention;  Surgeon: Mady Bruckner, MD;  Location: [facility] INV[facility] CV LAB;  Service: Cardiovascular;  Laterality: N/A;  RCA   CYSTOSCOPY W/ RETROGRADES Bilateral 07/15/2021   Procedure: CYSTOSCOPY WITH RETROGRADE PYELOGRAM;  Surgeon: Roseann Adine PARAS., MD;  Location: [facility] ORS;  Service: Urology;  Laterality: Bilateral;   CYSTOSCOPY WITH STENT PLACEMENT Left 07/15/2021   Procedure: CYSTOSCOPY WITH STENT PLACEMENT;  Surgeon: Roseann Adine PARAS., MD;  Location: [facility] ORS;  Service: Urology;  Laterality: Left;   ENDARTERECTOMY Left 12/02/2014   Procedure: ENDARTERECTOMY CAROTID;  Surgeon: Krystal JULIANNA Doing, MD;  [facility] OR;  Service: Vascular;  Laterality:  Left;   HERNIA REPAIR     LAPAROSCOPIC NEPHRECTOMY Left    LAPAROSCOPIC NEPHRECTOMY Left 11/25/2021   Procedure: LEFT LAPAROSCOPIC RADICAL NEPHRECTOMY;  Surgeon: Cam Morene ORN, MD;  Location: [facility] ORS;  Service: Urology;  Laterality: Left;   LEFT HEART CATH AND CORONARY ANGIOGRAPHY N/A 08/02/2016   Procedure: Left Heart Cath and Coronary Angiography;  Surgeon: Mady Bruckner, MD;  Location: [facility]  INVASIVE CV LAB;  Service: Cardiovascular;  Laterality: N/A;   LOWER EXTREMITY ANGIOGRAM  08/02/2016   LOWER EXTREMITY ANGIOGRAPHY N/A 08/02/2016   Procedure: Lower Extremity Angiography;  Surgeon: Mady Bruckner, MD;  Location: [facility];  Service: Cardiovascular;  Laterality: N/A;   NEPHRECTOMY Left    VAGINAL HYSTERECTOMY  1991   partial   VENTRAL HERNIA REPAIR N/A 08/05/2022   Procedure: LAPAROSCOPIC VENTRAL WALL HERNIA REPAIR WITH MESH;  Surgeon: Sheldon Standing, MD;  Location: [facility] ORS;  Service: General;  Laterality: N/A;    Family History: Family History  Problem Relation Age of Onset   Congestive Heart Failure Mother    COPD Father    Cancer Brother    Cancer Brother    Breast cancer Neg Hx     Social History:Tobacco Use History[1] Social History   Substance and Sexual Activity  Alcohol Use No   Alcohol/week: 0.0 standard drinks of alcohol   Social History   Substance and Sexual Activity  Drug Use No    Allergies: Allergies[2]  Medications: Current Outpatient Medications  Medication Sig Dispense Refill   pantoprazole  (PROTONIX ) 40 MG tablet Take 1 tablet (40 mg total) by mouth daily. Must keep upcoming appt for further refills. 30 tablet 0   acetaminophen  (TYLENOL ) 325 MG tablet Take 2 tablets (650 mg total) by mouth every 4 (four) hours as needed for headache or mild pain.     albuterol  (PROVENTIL ) (2.5 MG/3ML) 0.083% nebulizer solution Take 3 mLs (2.5 mg total) by nebulization every 6 (six) hours as needed for wheezing or shortness of breath. 75 mL 0   albuterol  (VENTOLIN  HFA) 108 (90 Base) MCG/ACT inhaler Inhale 2 puffs into the lungs every 6 (six) hours as needed. 8 g 0   ALPRAZolam  (XANAX ) 0.5 MG tablet Take 0.25 mg by mouth 2 (two) times daily.     atorvastatin  (LIPITOR ) 40 MG tablet TAKE 1 TABLET(40 MG) BY MOUTH DAILY 90 tablet 1   Budeson-Glycopyrrol-Formoterol  (BREZTRI AEROSPHERE) 160-9-4.8 MCG/ACT AERO Inhale into the lungs. 1-2 times per day      calcium  acetate (PHOSLO ) 667 MG tablet Take 667 mg by mouth daily. with food     Cholecalciferol (VITAMIN D3) 125 MCG (5000 UT) CAPS Take 5,000 Units by mouth once a week. Tuesday     clopidogrel  (PLAVIX ) 75 MG tablet Take 1 tablet (75 mg total) by mouth daily. 30 tablet 3   escitalopram  (LEXAPRO ) 20 MG tablet Take 20 mg by mouth daily.     gabapentin  (NEURONTIN ) 300 MG capsule Take 300 mg by mouth every other day. At night     glucose blood (ACCU-CHEK GUIDE) test strip Use as instructed 150 each 2   insulin  degludec (TRESIBA  FLEXTOUCH) 100 UNIT/ML FlexTouch Pen Inject 20 Units into the skin at bedtime. 18 mL 1   Insulin  Pen Needle (PEN NEEDLES) 31G X 6 MM MISC Use to inject insulin  once daily 100 each 3   lidocaine  (LIDODERM ) 5 % Place 1 patch onto the skin daily. Remove & Discard patch within 12 hours or as directed by MD 30 patch  0   LOKELMA  10 g PACK packet Take 10 g by mouth See admin instructions. Five times a week     magnesium  gluconate (MAGONATE) 500 (27 Mg) MG TABS tablet Take 100 mg by mouth in the morning and at bedtime.     nystatin cream (MYCOSTATIN) Apply 1 application  topically 2 (two) times daily as needed (Itching).  5   ondansetron  (ZOFRAN ) 4 MG tablet Take 1 tablet (4 mg total) by mouth every 6 (six) hours as needed for nausea or vomiting. 60 tablet 0   oxyCODONE -acetaminophen  (PERCOCET/ROXICET) 5-325 MG tablet Take 1 tablet by mouth every 6 (six) hours as needed for severe pain or moderate pain. 30 tablet 0   sodium bicarbonate  650 MG tablet Take 650 mg by mouth 2 (two) times daily.     tizanidine  (ZANAFLEX ) 2 MG capsule Take 1 capsule (2 mg total) by mouth 3 (three) times daily as needed for muscle spasms. Do not drink alcohol or drive while taking this medication.  May cause drowsiness. 15 capsule 0   No current facility-administered medications for this visit.    Review of Systems: GENERAL: negative for malaise, night sweats HEENT: No changes in hearing or vision,  no nose bleeds or other nasal problems. NECK: Negative for lumps, goiter, pain and significant neck swelling RESPIRATORY: Negative for cough, wheezing CARDIOVASCULAR: Negative for chest pain, leg swelling, palpitations, orthopnea GI: SEE HPI MUSCULOSKELETAL: Negative for joint pain or swelling, back pain, and muscle pain. SKIN: Negative for lesions, rash HEMATOLOGY Negative for prolonged bleeding, bruising easily, and swollen nodes. ENDOCRINE: Negative for cold or heat intolerance, polyuria, polydipsia and goiter. NEURO: negative for tremor, gait imbalance, syncope and seizures. The remainder of the review of systems is noncontributory.   Physical Exam: Temp (!) 97.1 F (36.2 C) (Temporal)   Ht 5' 5 (1.651 m)   Wt 139 lb (63 kg)   BMI 23.13 kg/m  GENERAL: The patient is AO x3, in no acute distress. HEENT: Head is normocephalic and atraumatic. EOMI are intact. Mouth is well hydrated and without lesions. NECK: Supple. No masses LUNGS: Clear to auscultation. No presence of rhonchi/wheezing/rales. Adequate chest expansion HEART: RRR, normal s1 and s2. ABDOMEN: Soft, nontender, no guarding, no peritoneal signs, and nondistended. BS +. No masses.    Imaging/Labs: as above     Latest Ref Rng & Units 02/16/2024    8:46 AM 07/18/2023   12:12 PM 06/04/2023    5:57 PM  CBC  WBC 4.0 - 10.5 K/uL 11.2  9.3  8.4   Hemoglobin 12.0 - 15.0 g/dL 89.4  88.4  9.7   Hematocrit 36.0 - 46.0 % 33.3  36.3  30.9   Platelets 150 - 400 K/uL 354  259  230    Lab Results  Component Value Date   IRON 29 02/16/2024   TIBC 176 (L) 02/16/2024   FERRITIN 354 (H) 02/16/2024    I personally reviewed and interpreted the available labs, imaging and endoscopic files.  Impression and Plan:  KARLIAH KOWALCHUK is a 68 y.o. female with Anxiety, coronary artery disease status post stent placement on Plavix , GERD, hypertension, hyperlipidemia, obesity, stroke, diabetes CKD who presents for evaluation of GERD and  iron deficiency anemia  #GERD #Iron deficiency anemia  GERD is well-controlled on Protonix  daily, no dysphagia odynophagia  Patient last colonoscopy 2010 have not had a follow-up colonoscopy since then  Labs with anemia with component of iron deficiency anemia but this is mostly multifactorial with CKD/anemia of  chronic disease  As per ACG guidelines , bidirectional endoscopy is recommended over iron replacement therapy only. We will proceed along with Upper endoscopy with small bowel biopsies and Colonoscopy -patient would like to get schedule in February 2026  Protonix  daily 30 minutes before breakfast  I thoroughly discussed with the patient the procedure, including the risks involved. Patient understands what the procedure involves including the benefits and any risks. Patient understands alternatives to the proposed procedure. Risks including (but not limited to) bleeding, tearing of the lining (perforation), rupture of adjacent organs, problems with heart and lung function, infection, and medication reactions. A small percentage of complications may require surgery, hospitalization, repeat endoscopic procedure, and/or transfusion.  Patient understood and agreed.    All questions were answered.      Delane Wessinger Faizan Tashya Alberty, MD Gastroenterology and Hepatology [facility]astroenterology   This chart has been completed using [facility] Dictation software, and while attempts have been made to ensure accuracy , certain words and phrases may not be transcribed as intended      [1]  Social History Tobacco Use  Smoking Status Every Day   Current packs/day: 1.50   Average packs/day: 1.5 packs/day for 49.8 years (74.7 ttl pk-yrs)   Types: Cigarettes   Start date: 05/17/1974  Smokeless Tobacco Never  [2]  Allergies Allergen Reactions   Codeine Shortness Of Breath   Sulfa Antibiotics Other (See Comments)    Unknown    Latex Itching

## 2024-03-07 NOTE — Telephone Encounter (Signed)
 Tried contacting patient to schedule IN OFFICE VISIT no answer left a vm to call back and schedule

## 2024-03-07 NOTE — Patient Instructions (Signed)
 It was very nice to meet you today, as dicussed with will plan for the following :  1) Upper endoscopy and colonoscopy  2) I recommend taking Protonix  40mg  , 30 min before breakfast

## 2024-03-07 NOTE — Telephone Encounter (Signed)
" ° ° °  03/07/2024  Grace Hess 04-Oct-1955  What type of surgery is being performed? Colonoscopy and esophagogastroduodenoscopy  When is surgery scheduled? February 2026  What type of clearance is required (medical or pharmacy to hold medication or both? Medication   Are there any medications that need to be held prior to surgery and how long? Plavix , hold 5 days prior to procedure.  Name of physician performing surgery?  Dr. Cinderella Rouse Gastroenterology at [facility] Phone: [phone] Fax: [phone]  Anethesia type (none, local, MAC, general)? MAC   "

## 2024-03-09 NOTE — Telephone Encounter (Signed)
 LVM to call back and schedule appointment for clearance

## 2024-03-11 NOTE — Progress Notes (Deleted)
 " Cardiology Office Note:  .   Date:  03/11/2024  ID:  OUIDA ABEYTA, DOB Jan 03, 1956, MRN [id number] PCP: Shona Norleen PEDLAR, MD  [facility] Providers Cardiologist:  Alvan Carrier, MD {  History of Present Illness: .   Grace Hess is a 68 y.o. female  with PMHx of CAD, PAD, carotid artery stenosis, orthostatic hypotension, bradycardia requiring hold of metoprolol , HTN, HLD, Type 2 DM, CKD stage 4-5 (followed by Dr. Rachele), renal cell cancer, prior left adrenalectomy and left nephrectomy, lung nodules (felt benign per 12/2022 CT), iron deficiency anemia, aortic atherosclerosis, tobacco use. who reports to [facility] office for cardiac preop and 6 month follow up.   Pertinent cardiac medical history:  CAD  s/p DES to RCA in 07/2016 with residual mid-LAD stenosis  Echo 05/2022 (report listed under scanned document) showed EF 60-65%, moderate LAE, indeterminate diastolic parameters, mild calcification of the aortic valve without aortic stenosis.  Previously self-discontinued ASA due to easy bruising but has been agreeable to Plavix .  HLD  previously had muscle aches with high dose atorvastatin  and higher dose rosuvastatin .  PAD known CTO of left SFA Carotid artery stenosis  s/p L CEA, 70-99% R carotid stenosis now followed by vascular surgery Orthostatic hypotension Resolved off amlodipine  and Lasix. Sinus bradycardia  Resolved with discontinuing metoprolol   Last seen in heartcare 05/09/2023 by Raphael Bring, PA-C for follow-up.  Reported chronic generalized fatigue, otherwise doing well from cardiology standpoint.  Switched from Crestor  20 mg daily to Lipitor  40 mg daily.  Continued on Plavix  75 mg daily. Ordered repeat CMP and Lipid panel, which is still pending.   Today, reports ### and denies ###.  Denies chest pain, shortness of breath, palpitations, syncope, presyncope, dizziness, orthopnea, PND, swelling or significant weight changes, acute bleeding, or claudication.    Reports compliance with medications.  Dietary habitats:  Activity level:  Social: Denies tobacco use/alcohol/drug use  Denies any recent hospitalizations or visits to the emergency department.   Preoperative Cardiovascular Risk Assessment Type of surgery? Colonoscopy and esophagogastroduodenoscopy When is surgery scheduled? February 2026 Type of clearance is required? Medication   Are there any medications that need to be held prior to surgery and how long? Plavix , hold 5 days prior to procedure. Name of physician performing surgery?  Dr. Cinderella Rouse Gastroenterology at [facility] Phone: [phone] Fax: [phone] Anethesia type (none, local, MAC, general)? MAC Revised Cardiac Risk Index (RCRI) with # point and perioperative risk of major cardiac events is #%.  Duke Activity Status Index (DASI) with > 4 mets .  From cardiac standpoint, this patient is at acceptable risk for the planned procedure without further cardiovascular testing. Patient has unstable cardiac issues, he may proceed without additional testing. He understands his elevated risk of MACE.  Clearance request to hold Eliquis 3 days prior to procedure. Will verify proper hold time with pharmacy. Once pharmacy confirms, will send updated note to surgeon office.    Clearance request to hold Clopidogrel /Brillinta - refer to heartcare table  CAD in native artery Hyperlipidemia LDL goal <70 Reviewed EKG 11 Denies angina symptoms. No need for further ischemic evaluation at this time.  LDL? LFT? Continue Plavix  75 mg daily Previously self-discontinued ASA due to easy bruising.  Previously had muscle aches with high dose atorvastatin  and higher dose rosuvastatin .  Not on BB due to associated bradycardia.   PAD (peripheral artery disease) Continue to follow with vascular surgery  Carotid artery disease, unspecified laterality, unspecified type Continue to follow with  vascular surgery  Bradycardia Resolved off  BB.  Continue to monitor   Orthostatic hypotension Resolved off amlodipine  and Lasix. Continue to monitor  Tobacco use Currently smokes # PPD  Dicussed cessation but no desire for cessation    CKD (chronic kidney disease), stage IV (HCC) Continue to follow with Dr. Rachele  ROS: 10 point review of system has been reviewed and considered negative except ones been listed in the HPI.   Studies Reviewed: SABRA   Cath 2018  Prox LAD to Mid LAD lesion, 20 %stenosed.   Conclusions: Significant 2 vessel coronary artery disease, including 60-70% mid LAD and 90% proximal RCA stenoses. Mildly elevated left ventricular filling pressure. Aortic atherosclerosis with mild to moderate bilateral common iliac artery disease. 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 Bilateral two-vessel runoff. Successful PCI to the proximal RCA with placement of a Promus Premier 2.5 x 16 mm drug-eluting stent with 0% residual stenosis and TIMI-3 flow.   Recommendations: Continue dual antiplatelet therapy with aspirin  and clopidogrel  for at least 6 months, ideally longer. If patient has recurrent chest pain, consider FFR guided PCI (likely requiring orbital atherectomy) to the mid LAD. Peripheral angiography reviewed with Dr. Court; endovascular intervention to left SFA could be considered in the future, if claudication persists.  ECHO IMPRESSIONS 05/2023  1. Left ventricular ejection fraction, by estimation, is 70 to 75%. The  left ventricle has hyperdynamic function. The left ventricle has no  regional wall motion abnormalities. There is mild left ventricular  hypertrophy. Left ventricular diastolic  parameters are indeterminate. Elevated left atrial pressure.   2. Right ventricular systolic function is normal. The right ventricular  size is normal.   3. Left atrial size was mildly dilated.   4. The mitral valve is normal in structure. Trivial mitral valve  regurgitation.  No evidence of mitral stenosis.   5. The aortic valve was not well visualized. There is mild calcification  of the aortic valve. There is mild thickening of the aortic valve. Aortic  valve regurgitation is mild. No aortic stenosis is present.   6. The inferior vena cava is normal in size with greater than 50%  respiratory variability, suggesting right atrial pressure of 3 mmHg.   7. Agitated saline contrast bubble study was negative, with no evidence  of any interatrial shunt.  Risk Assessment/Calculations:   {Does this patient have ATRIAL FIBRILLATION?:[phone]} No BP recorded.  {Refresh Note OR Click here to enter BP  :1}***       Physical Exam:   VS:  There were no vitals taken for this visit.   Wt Readings from Last 3 Encounters:  03/07/24 139 lb (63 kg)  02/29/24 140 lb (63.5 kg)  01/13/24 145 lb 3.2 oz (65.9 kg)    GEN: Well nourished, well developed in no acute distress while sitting in chair.  NECK: No JVD; No carotid bruits CARDIAC: ***RRR, no murmurs, rubs, gallops RESPIRATORY:  Clear to auscultation without rales, wheezing or rhonchi  ABDOMEN: Soft, non-tender, non-distended EXTREMITIES:  No edema; No deformity   ASSESSMENT AND PLAN: .   ***    {Are you ordering a CV Procedure (e.g. stress test, cath, DCCV, TEE, etc)?   Press F2        :[id number]}  Dispo: ***  Signed, Lorette CINDERELLA Kapur, PA-C  "

## 2024-03-12 ENCOUNTER — Encounter: Payer: Self-pay | Admitting: *Deleted

## 2024-03-12 NOTE — Telephone Encounter (Signed)
 Pt has appt Grace Hess, [facility] 03/14/24 for preop clearance . Will update all parties involved.

## 2024-03-14 ENCOUNTER — Ambulatory Visit: Admitting: Physician Assistant

## 2024-03-14 DIAGNOSIS — I779 Disorder of arteries and arterioles, unspecified: Secondary | ICD-10-CM

## 2024-03-14 DIAGNOSIS — Z720 Tobacco use: Secondary | ICD-10-CM

## 2024-03-14 DIAGNOSIS — R001 Bradycardia, unspecified: Secondary | ICD-10-CM

## 2024-03-14 DIAGNOSIS — E785 Hyperlipidemia, unspecified: Secondary | ICD-10-CM

## 2024-03-14 DIAGNOSIS — I2510 Atherosclerotic heart disease of native coronary artery without angina pectoris: Secondary | ICD-10-CM

## 2024-03-14 DIAGNOSIS — N184 Chronic kidney disease, stage 4 (severe): Secondary | ICD-10-CM

## 2024-03-14 DIAGNOSIS — I739 Peripheral vascular disease, unspecified: Secondary | ICD-10-CM

## 2024-03-14 DIAGNOSIS — I951 Orthostatic hypotension: Secondary | ICD-10-CM

## 2024-03-19 NOTE — Progress Notes (Signed)
 " Cardiology Office Note:  .   Date:  03/20/2024  ID:  TERREA BRUSTER, DOB 1956/01/13, MRN [id number] PCP: Shona Norleen PEDLAR, MD  [facility] Providers Cardiologist:  Alvan Carrier, MD {  History of Present Illness: .   Grace Hess is a 69 y.o. female  with PMHx of CAD, PAD, carotid artery stenosis, orthostatic hypotension, bradycardia requiring hold of metoprolol , HTN, HLD, Type 2 DM, CKD stage 4-5 (followed by Dr. Rachele), renal cell cancer, prior left adrenalectomy and left nephrectomy, lung nodules (felt benign per 12/2022 CT), iron deficiency anemia, aortic atherosclerosis, tobacco use. who reports to [facility] office for cardiac preop and 6 month follow up.   Pertinent cardiac medical history:  CAD  s/p DES to RCA in 07/2016 with residual mid-LAD stenosis  Echo 05/2022 (report listed under scanned document) showed EF 60-65%, moderate LAE, indeterminate diastolic parameters, mild calcification of the aortic valve without aortic stenosis.  Echo 05/2023 Previously self-discontinued ASA due to easy bruising but has been agreeable to Plavix .  HLD  previously had muscle aches with high dose atorvastatin  and higher dose rosuvastatin .  PAD known CTO of left SFA Carotid artery stenosis  s/p L CEA, 70-99% R carotid stenosis now followed by vascular surgery Orthostatic hypotension Resolved off amlodipine  and Lasix. Sinus bradycardia  Resolved with discontinuing metoprolol   Last seen in heartcare 05/09/2023 by Dayna Dunn, PA-C for follow-up.  Reported chronic generalized fatigue, otherwise doing well from cardiology standpoint.  Switched from Crestor  20 mg daily to Lipitor  40 mg daily.  Continued on Plavix  75 mg daily. Ordered repeat CMP and Lipid panel, which is still pending.   Today, reports a fall from bed on Thanksgiving morning resulting in three right rib fractures. Since the fall, she notes progressively worsening shortness of breath with minimal exertion over the past 5-6  weeks and has been using 2 L oxygen  via nasal cannula more frequently at night. Per chart review, CXR 02/11/2024 showed mildly displaced fractures of the anterior right 7th-8th ribs and lateral right 5th rib without pneumothorax; treated conservatively with muscle relaxants. She is concerned her breathing is worsening.  She remains active with household chores, grocery shopping, and caring for her 83-year-old granddaughter but requires intermittent rest due to dyspnea. Denies chest pain, palpitations, syncope, presyncope, dizziness, orthopnea, PND, edema, weight changes, bleeding, or claudication. Reports medication compliance. Smokes >1 PPD; denies alcohol or illicit drug use. No known pulmonary disease; pulmonology appointment scheduled 1/21.  ROS: 10 point review of system has been reviewed and considered negative except ones been listed in the HPI.   Studies Reviewed: SABRA   Cath 2018  Prox LAD to Mid LAD lesion, 20 %stenosed.   Conclusions: Significant 2 vessel coronary artery disease, including 60-70% mid LAD and 90% proximal RCA stenoses. Mildly elevated left ventricular filling pressure. Aortic atherosclerosis with mild to moderate bilateral common iliac artery disease. 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 Bilateral two-vessel runoff. Successful PCI to the proximal RCA with placement of a Promus Premier 2.5 x 16 mm drug-eluting stent with 0% residual stenosis and TIMI-3 flow.   Recommendations: Continue dual antiplatelet therapy with aspirin  and clopidogrel  for at least 6 months, ideally longer. If patient has recurrent chest pain, consider FFR guided PCI (likely requiring orbital atherectomy) to the mid LAD. Peripheral angiography reviewed with Dr. Court; endovascular intervention to left SFA could be considered in the future, if claudication persists.  ECHO IMPRESSIONS 05/2023  1. Left ventricular ejection  fraction, by estimation, is 70 to 75%.  The  left ventricle has hyperdynamic function. The left ventricle has no  regional wall motion abnormalities. There is mild left ventricular  hypertrophy. Left ventricular diastolic  parameters are indeterminate. Elevated left atrial pressure.   2. Right ventricular systolic function is normal. The right ventricular  size is normal.   3. Left atrial size was mildly dilated.   4. The mitral valve is normal in structure. Trivial mitral valve  regurgitation. No evidence of mitral stenosis.   5. The aortic valve was not well visualized. There is mild calcification  of the aortic valve. There is mild thickening of the aortic valve. Aortic  valve regurgitation is mild. No aortic stenosis is present.   6. The inferior vena cava is normal in size with greater than 50%  respiratory variability, suggesting right atrial pressure of 3 mmHg.   7. Agitated saline contrast bubble study was negative, with no evidence  of any interatrial shunt.   Physical Exam:   VS:  BP 104/60   Pulse 85   Ht 5' 5 (1.651 m)   Wt 136 lb 12.8 oz (62.1 kg)   SpO2 90%   BMI 22.76 kg/m    Wt Readings from Last 3 Encounters:  03/20/24 136 lb 12.8 oz (62.1 kg)  03/07/24 139 lb (63 kg)  02/29/24 140 lb (63.5 kg)    GEN: Well nourished, well developed in no acute distress while sitting in chair.  NECK: No JVD; No carotid bruits CARDIAC: RRR, no murmurs, rubs, gallops RESPIRATORY:  Clear to auscultation without rales, wheezing or rhonchi  ABDOMEN: Soft, non-tender, non-distended EXTREMITIES:  No edema; No deformity   ASSESSMENT AND PLAN: .    Preoperative Cardiovascular Risk Assessment Type of surgery? Colonoscopy and esophagogastroduodenoscopy When is surgery scheduled? February 2026 Type of clearance is required? Medication   Are there any medications that need to be held prior to surgery and how long? Plavix , hold 5 days prior to procedure. Name of physician performing surgery?  Dr. Cinderella Rouse  Gastroenterology at [facility] Phone: [phone] Fax: [phone] Anethesia type (none, local, MAC, general)? MAC Revised Cardiac Risk Index (RCRI) with 4 point and perioperative risk of major cardiac events is 11%.  Duke Activity Status Index (DASI) with > 4 mets .  Patient has unstable cardiac issues and understands his elevated risk of MACE. Would recommend holding off on procedure pending ECHO results and Pulmonary recommendation for worsening SOB. Patient is aware and agrees.  After officially cleared for procedure by cardiology and pulmonary, would agree to hold Plavix  5 day prior to procedure.   Rib fracture  SOB  CXR 02/11/2024 showed mildly displaced fractures of the anterior right 7th-8th ribs and lateral right 5th rib without pneumothorax Reports worsening SOB since rib fracture and using O2 more frequently. Less likely for pneumothorax with good airflow on exam in all lung zones. Order CXR to assess for pneumothorax Order echo to assess for heart function. Strongly encourage follow up with Pulmonary.  Suspect undiagnosed COPD is a contributing factor given tobacco use.   CAD in native artery Hyperlipidemia LDL goal <70 Reviewed EKG 88/7974: NSR w/o any acute ischemic changes.  Denies angina symptoms. No need for further ischemic evaluation at this time.  LDL 65 in 02/2024.  Continue Plavix  75 mg daily and Lipitor  40 mg daily.  Previously self-discontinued ASA due to easy bruising.  Previously had muscle aches with high dose atorvastatin  and higher dose rosuvastatin .  Not on  BB due to associated bradycardia.   PAD (peripheral artery disease) Continue to follow with vascular surgery  Carotid artery disease, unspecified laterality, unspecified type Continue to follow with vascular surgery  Bradycardia Resolved off BB.  Continue to monitor   Orthostatic hypotension Resolved off amlodipine  and Lasix. Continue to monitor  Tobacco use Currently smokes 1 PPD   Dicussed cessation but no desire for cessation    CKD (chronic kidney disease), stage IV (HCC) Continue to follow with Dr. Rachele    Dispo: Follow up in 6-8 weeks   Signed, Lorette CINDERELLA Kapur, PA-C  "

## 2024-03-20 ENCOUNTER — Encounter: Payer: Self-pay | Admitting: Physician Assistant

## 2024-03-20 ENCOUNTER — Ambulatory Visit: Attending: Physician Assistant | Admitting: Physician Assistant

## 2024-03-20 VITALS — BP 104/60 | HR 85 | Ht 65.0 in | Wt 136.8 lb

## 2024-03-20 DIAGNOSIS — R001 Bradycardia, unspecified: Secondary | ICD-10-CM | POA: Diagnosis not present

## 2024-03-20 DIAGNOSIS — S2241XD Multiple fractures of ribs, right side, subsequent encounter for fracture with routine healing: Secondary | ICD-10-CM

## 2024-03-20 DIAGNOSIS — E785 Hyperlipidemia, unspecified: Secondary | ICD-10-CM | POA: Diagnosis not present

## 2024-03-20 DIAGNOSIS — I779 Disorder of arteries and arterioles, unspecified: Secondary | ICD-10-CM

## 2024-03-20 DIAGNOSIS — Z720 Tobacco use: Secondary | ICD-10-CM

## 2024-03-20 DIAGNOSIS — I951 Orthostatic hypotension: Secondary | ICD-10-CM

## 2024-03-20 DIAGNOSIS — Z01810 Encounter for preprocedural cardiovascular examination: Secondary | ICD-10-CM

## 2024-03-20 DIAGNOSIS — I2510 Atherosclerotic heart disease of native coronary artery without angina pectoris: Secondary | ICD-10-CM | POA: Diagnosis not present

## 2024-03-20 DIAGNOSIS — N184 Chronic kidney disease, stage 4 (severe): Secondary | ICD-10-CM

## 2024-03-20 DIAGNOSIS — S2241XS Multiple fractures of ribs, right side, sequela: Secondary | ICD-10-CM

## 2024-03-20 DIAGNOSIS — R0602 Shortness of breath: Secondary | ICD-10-CM | POA: Diagnosis not present

## 2024-03-20 DIAGNOSIS — I739 Peripheral vascular disease, unspecified: Secondary | ICD-10-CM | POA: Diagnosis not present

## 2024-03-20 NOTE — Patient Instructions (Signed)
 Medication Instructions:  Your physician recommends that you continue on your current medications as directed. Please refer to the Current Medication list given to you today.  *If you need a refill on your cardiac medications before your next appointment, please call your pharmacy*  Lab Work: NONE   If you have labs (blood work) drawn today and your tests are completely normal, you will receive your results only by: MyChart Message (if you have MyChart) OR A paper copy in the mail If you have any lab test that is abnormal or we need to change your treatment, we will call you to review the results.  Testing/Procedures: A chest x-ray takes a picture of the organs and structures inside the chest, including the heart, lungs, and blood vessels. This test can show several things, including, whether the heart is enlarges; whether fluid is building up in the lungs; and whether pacemaker / defibrillator leads are still in place.   Your physician has requested that you have an echocardiogram. Echocardiography is a painless test that uses sound waves to create images of your heart. It provides your doctor with information about the size and shape of your heart and how well your hearts chambers and valves are working. This procedure takes approximately one hour. There are no restrictions for this procedure. Please do NOT wear cologne, perfume, aftershave, or lotions (deodorant is allowed). Please arrive 15 minutes prior to your appointment time.  Please note: We ask at that you not bring children with you during ultrasound (echo/ vascular) testing. Due to room size and safety concerns, children are not allowed in the ultrasound rooms during exams. Our front office staff cannot provide observation of children in our lobby area while testing is being conducted. An adult accompanying a patient to their appointment will only be allowed in the ultrasound room at the discretion of the ultrasound technician under  special circumstances. We apologize for any inconvenience.\  Follow-Up: At [facility], you and your health needs are our priority.  As part of our continuing mission to provide you with exceptional heart care, our providers are all part of one team.  This team includes your primary Cardiologist (physician) and Advanced Practice Providers or APPs (Physician Assistants and Nurse Practitioners) who all work together to provide you with the care you need, when you need it.  Your next appointment:   6 -8 week(s)  Provider:   Lorette Kapur, PA-C     We recommend signing up for the patient portal called MyChart.  Sign up information is provided on this After Visit Summary.  MyChart is used to connect with patients for Virtual Visits (Telemedicine).  Patients are able to view lab/test results, encounter notes, upcoming appointments, etc.  Non-urgent messages can be sent to your provider as well.   To learn more about what you can do with MyChart, go to [email or web address].   Other Instructions Thank you for choosing [facility]!

## 2024-03-27 ENCOUNTER — Ambulatory Visit (HOSPITAL_COMMUNITY)
Admission: RE | Admit: 2024-03-27 | Discharge: 2024-03-27 | Disposition: A | Discharge status: Home / Self Care | Source: Ambulatory Visit | Attending: Physician Assistant | Admitting: Physician Assistant

## 2024-03-27 DIAGNOSIS — R0602 Shortness of breath: Secondary | ICD-10-CM | POA: Insufficient documentation

## 2024-03-27 NOTE — Telephone Encounter (Signed)
 Grace Hess 1955-04-21   What type of surgery is being performed? Colonoscopy and esophagogastroduodenoscopy   When is surgery scheduled? February 2026   What type of clearance is required (medical or pharmacy to hold medication or both? Medication    Are there any medications that need to be held prior to surgery and how long? Plavix , hold 5 days prior to procedure.   Name of physician performing surgery?  Dr. Cinderella Rouse Gastroenterology at [facility] Phone: [phone] Fax: [phone]   Anethesia type (none, local, MAC, general)? MAC

## 2024-03-27 NOTE — Telephone Encounter (Signed)
 Patient had concerning cardiac symptoms and is awaiting echo 04/18/24 and new patient appointment with pulmonology 04/04/24. Therefore, she is not currently cleared to proceed with her colonoscopy and EGD. Will await test results and recommendations from pulmonology.

## 2024-04-02 ENCOUNTER — Ambulatory Visit: Payer: Self-pay | Admitting: Physician Assistant

## 2024-04-04 ENCOUNTER — Ambulatory Visit: Admitting: Internal Medicine

## 2024-04-04 ENCOUNTER — Encounter: Payer: Self-pay | Admitting: Internal Medicine

## 2024-04-04 VITALS — BP 142/68 | HR 101 | Ht 65.0 in | Wt 142.0 lb

## 2024-04-04 DIAGNOSIS — F17210 Nicotine dependence, cigarettes, uncomplicated: Secondary | ICD-10-CM | POA: Diagnosis not present

## 2024-04-04 DIAGNOSIS — R0609 Other forms of dyspnea: Secondary | ICD-10-CM | POA: Insufficient documentation

## 2024-04-04 DIAGNOSIS — J90 Pleural effusion, not elsewhere classified: Secondary | ICD-10-CM

## 2024-04-04 DIAGNOSIS — J9611 Chronic respiratory failure with hypoxia: Secondary | ICD-10-CM

## 2024-04-04 MED ORDER — BREZTRI AEROSPHERE 160-9-4.8 MCG/ACT IN AERO: 2.0000 | INHALATION_SPRAY | Freq: Two times a day (BID) | RESPIRATORY_TRACT | Status: AC

## 2024-04-04 NOTE — Patient Instructions (Addendum)
 Plan A = Automatic = Always=    Breztri  Take 2 puffs first thing in am and then another 2 puffs about 12 hours later.    Work on inhaler technique:  relax and gently blow all the way out then take a nice smooth full deep breath back in, triggering the inhaler at same time you start breathing in.  Hold breath in for at least  5 seconds if you can. Blow out Breztri   thru nose. Rinse and gargle with water  when done.  If mouth or throat bother you at all,  try brushing teeth/gums/tongue with arm and hammer toothpaste/ make a slurry and gargle and spit out.   >>>  Remember how golfers warm up by taking practice swings - do this with an empty inhaler   Plan B = Backup (to supplement plan A, not to replace it) Use your albuterol  inhaler as a rescue medication to be used if you can't catch your breath by resting or slowing your pace  or doing a relaxed purse lip breathing pattern.  - The less you use it, the better it will work when you need it. - Ok to use the inhaler up to 2 puffs  every 4 hours if you must but call for appointment if use goes up over your usual need - Don't leave home without it !!  (think of it like the spare tire or starter fluid for your car)   Plan C = Crisis (instead of Plan B but only if Plan B stops working) - only use your albuterol  nebulizer if you first try Plan B and it fails to help > ok to use the nebulizer up to every 4 hours but if start needing it regularly call for immediate appointment  My office will be contacting you by phone for referral to do R thoracentesis as soon as possible  ([facility])  - if you don't hear back from my office within one week please call us  back or notify us  thru MyChart and we'll address it right away.   Please remember to go to the lab department   for your tests - we will call you with the results when they are available.      Make sure you check your oxygen  saturation  AT  your highest level of activity (not after you stop)   to be sure  it stays over 90% and adjust  02 flow upward to maintain this level if needed but remember to turn it back to previous settings when you stop (to conserve your supply).    Please schedule a follow up office visit in 2 weeks, sooner if needed  with all RESPIRATORY medications /inhalers/ solutions in hand so we can verify exactly what you are taking. This includes all medications from all doctors and over the counters

## 2024-04-04 NOTE — Progress Notes (Signed)
 "   Grace Hess, female    DOB: 1955-04-11    MRN: [id number]   Brief patient profile:  82  yowf  active smoker  referred to pulmonary clinic in [place]  04/04/2024 by [facility] cardiology PA for pleural effusion in setting of increasing doe on prn breztri  x sev years  with cxr 01/21/24 no findings>>>  no change  rx.   Then  at Tgiving 2025 she  fell out of bed and hit R chest > 2 days later to UC > 3 cracked rib  no significant effusion but worsening doe and incidental note of small R effuions 02/16/24 with f/u cxr 03/27/24 much larger effusion so referred to Pulmonary in [place]  Pt not previously seen by PCCM service.     History of Present Illness  04/04/2024  Pulmonary/ 1st office eval/ [name] / [place] Office breztri  this am  Chief Complaint  Patient presents with   Establish Care    Pleural effusion - shob - coughing   Dyspnea:  [employer] ok one week prior to OV  / no more cp  Cough: turned green x few weeks/ smoker's rattle  takes 10 min to clear it in am Sleep: bed is flat/ 2 pillows / tessalon helps noct cough  SABA use: albuterol  prn not using much breztri   02:  2.5 on concentrator occ daytime / not using portable    No obvious day to day or daytime pattern/variability or assoc  mucus plugs or hemoptysis or cp or chest tightness, subjective wheeze or overt sinus or hb symptoms.    Also denies any obvious fluctuation of symptoms with weather or environmental changes or other aggravating or alleviating factors except as outlined above   No unusual exposure hx or h/o childhood pna/ asthma or knowledge of premature birth.  Current Allergies, Complete Past Medical History, Past Surgical History, Family History, and Social History were reviewed in [employer] record.  ROS  The following are not active complaints unless bolded Hoarseness, sore throat, dysphagia, dental problems, itching, sneezing,  nasal congestion or discharge of excess mucus or purulent  secretions, ear ache,   fever, chills, sweats, unintended wt loss or wt gain, classically pleuritic or exertional cp,  orthopnea pnd or arm/hand swelling  or leg swelling, presyncope, palpitations, abdominal pain, anorexia, nausea, vomiting, diarrhea  or change in bowel habits or change in bladder habits, change in stools or change in urine, dysuria, hematuria,  rash, arthralgias, visual complaints, headache, numbness, weakness or ataxia or problems with walking or coordination,  change in mood or  memory.            Outpatient Medications Prior to Visit  Medication Sig Dispense Refill   acetaminophen  (TYLENOL ) 325 MG tablet Take 2 tablets (650 mg total) by mouth every 4 (four) hours as needed for headache or mild pain.     albuterol  (PROVENTIL ) (2.5 MG/3ML) 0.083% nebulizer solution Take 3 mLs (2.5 mg total) by nebulization every 6 (six) hours as needed for wheezing or shortness of breath. 75 mL 0   albuterol  (VENTOLIN  HFA) 108 (90 Base) MCG/ACT inhaler Inhale 2 puffs into the lungs every 6 (six) hours as needed. 8 g 0   ALPRAZolam  (XANAX ) 0.5 MG tablet Take 0.25 mg by mouth 2 (two) times daily.     atorvastatin  (LIPITOR ) 40 MG tablet TAKE 1 TABLET(40 MG) BY MOUTH DAILY 90 tablet 1   Budeson-Glycopyrrol-Formoterol  (BREZTRI  AEROSPHERE) 160-9-4.8 MCG/ACT AERO Inhale into the lungs. 1-2 times per  day     calcium  acetate (PHOSLO ) 667 MG tablet Take 667 mg by mouth daily. with food     Cholecalciferol (VITAMIN D3) 125 MCG (5000 UT) CAPS Take 5,000 Units by mouth once a week. Tuesday     clopidogrel  (PLAVIX ) 75 MG tablet Take 1 tablet (75 mg total) by mouth daily. 30 tablet 3   escitalopram  (LEXAPRO ) 20 MG tablet Take 20 mg by mouth daily.     gabapentin  (NEURONTIN ) 300 MG capsule Take 300 mg by mouth every other day. At night     glucose blood (ACCU-CHEK GUIDE) test strip Use as instructed 150 each 2   insulin  degludec (TRESIBA  FLEXTOUCH) 100 UNIT/ML FlexTouch Pen Inject 20 Units into the skin at  bedtime. 18 mL 1   Insulin  Pen Needle (PEN NEEDLES) 31G X 6 MM MISC Use to inject insulin  once daily 100 each 3   LOKELMA  10 g PACK packet Take 10 g by mouth See admin instructions. Five times a week     magnesium  gluconate (MAGONATE) 500 (27 Mg) MG TABS tablet Take 100 mg by mouth in the morning and at bedtime.     nystatin cream (MYCOSTATIN) Apply 1 application  topically 2 (two) times daily as needed (Itching).  5   ondansetron  (ZOFRAN ) 4 MG tablet Take 1 tablet (4 mg total) by mouth every 6 (six) hours as needed for nausea or vomiting. 60 tablet 0   oxyCODONE -acetaminophen  (PERCOCET/ROXICET) 5-325 MG tablet Take 1 tablet by mouth every 6 (six) hours as needed for severe pain or moderate pain. 30 tablet 0   pantoprazole  (PROTONIX ) 40 MG tablet TAKE 1 TABLET(40 MG) BY MOUTH DAILY 90 tablet 3   sodium bicarbonate  650 MG tablet Take 650 mg by mouth 2 (two) times daily.     tizanidine  (ZANAFLEX ) 2 MG capsule Take 1 capsule (2 mg total) by mouth 3 (three) times daily as needed for muscle spasms. Do not drink alcohol or drive while taking this medication.  May cause drowsiness. 15 capsule 0   No facility-administered medications prior to visit.    Past Medical History:  Diagnosis Date   Anxiety    CAD (coronary artery disease)    a. s/p DES to RCA in 07/2016 with residual mid-LAD stenosis   Cancer (HCC)    skin cancer on finger,, cancer left kidney   Cervicalgia    Chest pain, unspecified    Chronic kidney disease    Depression    GERD (gastroesophageal reflux disease)    Headache    bad ones after my stroke in 2016; only have them when I get bad news now (08/02/2016)   Heart murmur    Heavy smoker (more than 20 cigarettes per day)    Scheduled for LDCT screening 02/11/15   History of hiatal hernia    History of kidney stones    no cysto/OR (12/10/2014)   Hypercholesteremia    Hypertension    Incarcerated incisional hernia 08/05/2022   Left renal mass    Obesity    Palpitations     Reflux esophagitis    Stroke [facility])    they said I had a  MINI stroke a few months back/MRI (12/10/2014)   TIA (transient ischemic attack) 11/28/2014   thelbert 11/28/2014   Type II diabetes mellitus (HCC)       Objective:     BP (!) 142/68   Pulse (!) 101   Ht 5' 5 (1.651 m)   Wt 142 lb (64.4 kg)  SpO2 (!) 86% Comment: ra  BMI 23.63 kg/m    SpO2: (!) 86 % (ra) amb wf nad     HEENT : Oropharynx  clear      NECK :  without  apparent JVD/ palpable Nodes/TM    LUNGS: no acc muscle use,  Mild barrel  contour chest wall with bilateral  Distant BS with dullness 1/3 R base    CV:  RRR  no s3 or murmur or increase in P2, and no edema   ABD:  soft and nontender   MS:  Nl gait/ ext warm without deformities Or obvious joint restrictions  calf tenderness, cyanosis or clubbing     SKIN: warm and dry without lesions    NEURO:  alert, approp, nl sensorium with  no motor or cerebellar deficits apparent.      I personally reviewed images and agree with radiology impression as follows:  CXR:   03/27/24 1. Right middle and lower lobe consolidation with right pleural effusion. 2. Cardiomegaly.  Labs ordered/ reviewed:      Chemistry      Component Value Date/Time   NA 140 04/04/2024 0948   K 4.7 04/04/2024 0948   CL 101 04/04/2024 0948   CO2 21 04/04/2024 0948   BUN 42 (H) 04/04/2024 0948   CREATININE 3.02 (H) 04/04/2024 0948   CREATININE 1.38 (H) 08/20/2019 0719   GLU 213 10/05/2023 0000      Component Value Date/Time   CALCIUM  8.8 04/04/2024 0948   ALKPHOS 133 04/04/2024 0948   AST 13 04/04/2024 0948   ALT 8 04/04/2024 0948   BILITOT 0.2 04/04/2024 0948        Lab Results  Component Value Date   WBC 10.7 04/04/2024   HGB 10.9 (L) 04/04/2024   HCT 34.5 04/04/2024   MCV 92 04/04/2024   PLT 347 04/04/2024         Lab Results  Component Value Date   TSH 1.300 04/04/2024      BNP   04/04/2024    =   369     Lab Results  Component Value Date    ESRSEDRATE 21 04/04/2024   ESRSEDRATE 27 (H) 05/24/2023          Assessment   Assessment & Plan Pleural effusion on right Active smoker - last echo 05/24/23 Mild LAE with mild AR  Nl EF and nl R sided pressures  but no diastolic measures available - s/o R chest trauma with rib fx txgiving 2025  with ESR and hgb ok 04/04/2024     Most likely given the time course of evolving symptoms since fx R ribs the new R Pl effusion is related to that with ddx including partial hemothorax, post cardiac injury syndrome (Dressler's like inflammatory process though ESR against this or unrelated urinothorax given her issues with renal dysfunction (requires obstruction which is apparent on her abd us  when the effusion was first noted or underlying malignancy in this active smoker.   Rec:  >>> tap dry and repeat cxr / check labs when avaialble  Discussed in detail all the  indications, usual  risks and alternatives  relative to the benefits with patient who agrees to proceed with w/u as outlined.      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 Response to breeztri/ saba by hx suggests component of copd  >>>  Rec Breztri  Take 2  puffs first thing in am and then another 2 puffs about 12 hours later.  And approp saba:  Re SABA :  I spent extra time with pt today reviewing appropriate use of albuterol  for prn use on exertion with the following points: 1) saba is for relief of sob that does not improve by walking a slower pace or resting but rather if the pt does not improve after trying this first. 2) If the pt is convinced, as many are, that saba helps recover from activity faster then it's easy to tell if this is the case by re-challenging : ie stop, take the inhaler, then p 5 minutes try the exact same activity (intensity of workload) that just caused the symptoms and see if they are substantially diminished or not after saba 3) if there is an  activity that reproducibly causes the symptoms, try the saba 15 min before the activity on alternate days   If in fact the saba really does help, then fine to continue to use it prn but advised may need to look closer at the maintenance regimen being used to achieve better control of airways disease with exertion.   >>> PFTs  once effusion is addressed  Commente  Note that pleural effusion and copd have the same effect on insp muscles/mechanics (both shorten their length prior to inspiration making them weaker with less force reserve) so they are synergistic in causing doe.   Chronic respiratory failure with hypoxia (HCC) 02 dep at hs at 2.5 lpm as of 1st pulmonary eval 04/04/2024   Removing the R effusion should help some  >>>  observe 02 sats and titrate daytime 02 to keep > 90% - see avs for instructions unique to this ov     Heavy smoker (more than 20 cigarettes per day) Counseled re importance of smoking cessation but did not meet time criteria for separate billing   >>> discuss alpha one carrier status on return     Each maintenance medication was reviewed in detail including emphasizing most importantly the difference between maintenance and prns and under what circumstances the prns are to be triggered using an action plan format where appropriate.  Total time for H and P, chart review, counseling, reviewing hfa/ neb/ 02 /pulse ox  device(s) and generating customized AVS unique to this office visit / same day charting = 47 min complex new pt eval.         AVS  Patient Instructions  Plan A = Automatic = Always=    Breztri  Take 2 puffs first thing in am and then another 2 puffs about 12 hours later.    Work on inhaler technique:  relax and gently blow all the way out then take a nice smooth full deep breath back in, triggering the inhaler at same time you start breathing in.  Hold breath in for at least  5 seconds if you can. Blow out Breztri   thru nose. Rinse and gargle with  water  when done.  If mouth or throat bother you at all,  try brushing teeth/gums/tongue with arm and hammer toothpaste/ make a slurry and gargle and spit out.   >>>  Remember how golfers warm up by taking practice swings - do this with an empty inhaler   Plan B = Backup (to supplement plan A, not to replace it) Use your albuterol  inhaler as a rescue medication to be used if you can't catch your breath by resting or slowing your pace  or doing a  relaxed purse lip breathing pattern.  - The less you use it, the better it will work when you need it. - Ok to use the inhaler up to 2 puffs  every 4 hours if you must but call for appointment if use goes up over your usual need - Don't leave home without it !!  (think of it like the spare tire or starter fluid for your car)   Plan C = Crisis (instead of Plan B but only if Plan B stops working) - only use your albuterol  nebulizer if you first try Plan B and it fails to help > ok to use the nebulizer up to every 4 hours but if start needing it regularly call for immediate appointment  My office will be contacting you by phone for referral to do R thoracentesis as soon as possible  ([place])  - if you don't hear back from my office within one week please call us  back or notify us  thru MyChart and we'll address it right away.   Please remember to go to the lab department   for your tests - we will call you with the results when they are available.      Make sure you check your oxygen  saturation  AT  your highest level of activity (not after you stop)   to be sure it stays over 90% and adjust  02 flow upward to maintain this level if needed but remember to turn it back to previous settings when you stop (to conserve your supply).    Please schedule a follow up office visit in 2 weeks, sooner if needed  with all RESPIRATORY medications /inhalers/ solutions in hand so we can verify exactly what you are taking. This includes all medications from all doctors  and over the counters       Ozell America, MD 04/08/2024      "

## 2024-04-05 DIAGNOSIS — J9611 Chronic respiratory failure with hypoxia: Secondary | ICD-10-CM | POA: Insufficient documentation

## 2024-04-06 ENCOUNTER — Ambulatory Visit (HOSPITAL_COMMUNITY)
Admission: RE | Admit: 2024-04-06 | Discharge: 2024-04-06 | Disposition: A | Source: Ambulatory Visit | Attending: Internal Medicine | Admitting: Internal Medicine

## 2024-04-06 VITALS — BP 167/73 | HR 76 | Resp 17

## 2024-04-06 DIAGNOSIS — J90 Pleural effusion, not elsewhere classified: Secondary | ICD-10-CM | POA: Diagnosis present

## 2024-04-06 DIAGNOSIS — R0600 Dyspnea, unspecified: Secondary | ICD-10-CM | POA: Insufficient documentation

## 2024-04-06 DIAGNOSIS — I700 Atherosclerosis of aorta: Secondary | ICD-10-CM | POA: Insufficient documentation

## 2024-04-06 DIAGNOSIS — Z905 Acquired absence of kidney: Secondary | ICD-10-CM | POA: Insufficient documentation

## 2024-04-06 DIAGNOSIS — J948 Other specified pleural conditions: Secondary | ICD-10-CM | POA: Insufficient documentation

## 2024-04-06 DIAGNOSIS — Z85528 Personal history of other malignant neoplasm of kidney: Secondary | ICD-10-CM | POA: Insufficient documentation

## 2024-04-06 LAB — BODY FLUID CELL COUNT WITH DIFFERENTIAL
Eos, Fluid: 68 %
Lymphs, Fluid: 14 %
Monocyte-Macrophage-Serous Fluid: 10 % — ABNORMAL LOW (ref 50–90)
Neutrophil Count, Fluid: 8 % (ref 0–25)
Total Nucleated Cell Count, Fluid: 1656 uL — ABNORMAL HIGH (ref 0–1000)

## 2024-04-06 LAB — CBC WITH DIFFERENTIAL/PLATELET
Basophils Absolute: 0.1 x10E3/uL (ref 0.0–0.2)
Basos: 1 %
EOS (ABSOLUTE): 0.6 x10E3/uL — ABNORMAL HIGH (ref 0.0–0.4)
Eos: 6 %
Hematocrit: 34.5 % (ref 34.0–46.6)
Hemoglobin: 10.9 g/dL — ABNORMAL LOW (ref 11.1–15.9)
Immature Grans (Abs): 0.1 x10E3/uL (ref 0.0–0.1)
Immature Granulocytes: 1 %
Lymphocytes Absolute: 1.2 x10E3/uL (ref 0.7–3.1)
Lymphs: 11 %
MCH: 28.9 pg (ref 26.6–33.0)
MCHC: 31.6 g/dL (ref 31.5–35.7)
MCV: 92 fL (ref 79–97)
Monocytes Absolute: 0.7 x10E3/uL (ref 0.1–0.9)
Monocytes: 7 %
Neutrophils Absolute: 8 x10E3/uL — ABNORMAL HIGH (ref 1.4–7.0)
Neutrophils: 74 %
Platelets: 347 x10E3/uL (ref 150–450)
RBC: 3.77 x10E6/uL (ref 3.77–5.28)
RDW: 14.3 % (ref 11.7–15.4)
WBC: 10.7 x10E3/uL (ref 3.4–10.8)

## 2024-04-06 LAB — HEPATIC FUNCTION PANEL
ALT: 8 IU/L (ref 0–32)
AST: 13 IU/L (ref 0–40)
Albumin: 3.6 g/dL — ABNORMAL LOW (ref 3.9–4.9)
Alkaline Phosphatase: 133 IU/L (ref 49–135)
Bilirubin Total: 0.2 mg/dL (ref 0.0–1.2)
Bilirubin, Direct: 0.08 mg/dL (ref 0.00–0.40)
Total Protein: 6.5 g/dL (ref 6.0–8.5)

## 2024-04-06 LAB — BASIC METABOLIC PANEL WITH GFR
BUN/Creatinine Ratio: 14 (ref 12–28)
BUN: 42 mg/dL — ABNORMAL HIGH (ref 8–27)
CO2: 21 mmol/L (ref 20–29)
Calcium: 8.8 mg/dL (ref 8.7–10.3)
Chloride: 101 mmol/L (ref 96–106)
Creatinine, Ser: 3.02 mg/dL — ABNORMAL HIGH (ref 0.57–1.00)
Glucose: 188 mg/dL — ABNORMAL HIGH (ref 70–99)
Potassium: 4.7 mmol/L (ref 3.5–5.2)
Sodium: 140 mmol/L (ref 134–144)
eGFR: 16 mL/min/1.73 — ABNORMAL LOW

## 2024-04-06 LAB — ALPHA-1-ANTITRYPSIN PHENOTYP: A-1 Antitrypsin: 139 mg/dL (ref 101–187)

## 2024-04-06 LAB — TSH: TSH: 1.3 u[IU]/mL (ref 0.450–4.500)

## 2024-04-06 LAB — SEDIMENTATION RATE: Sed Rate: 21 mm/h (ref 0–40)

## 2024-04-06 LAB — BRAIN NATRIURETIC PEPTIDE: BNP: 369 pg/mL — ABNORMAL HIGH (ref 0.0–100.0)

## 2024-04-06 MED ORDER — LIDOCAINE HCL (PF) 2 % IJ SOLN
10.0000 mL | Freq: Once | INTRAMUSCULAR | Status: AC
  Administered 2024-04-06: 10 mL via INTRADERMAL

## 2024-04-06 NOTE — Progress Notes (Signed)
 Pt had a right ultrasound guided thoracentesis. 600cc of yellow pleural fluid removed. Pt tolerated well. VSS. Pt ambulated to xray in no acute distress. Given discharged instructions and pt ambulated out of facility

## 2024-04-06 NOTE — Procedures (Signed)
 PROCEDURE SUMMARY:  Successful image-guided right thoracentesis. Yielded 600 milliliters of clear yellow fluid. Patient tolerated procedure well. EBL < 1 mL. No immediate complications.  Specimen was sent for labs. Post procedure CXR shows no pneumothorax.  Please see imaging section of Epic for full dictation.  Clotilda DELENA Hesselbach PA-C 04/06/2024 10:14 AM

## 2024-04-08 LAB — LD, BODY FLUID (OTHER): LD, Body Fluid: 207 [IU]/L

## 2024-04-08 LAB — GLUCOSE, BODY FLUID OTHER: Glucose, Body Fluid Other: 217 mg/dL

## 2024-04-08 LAB — PROTEIN, BODY FLUID (OTHER): Total Protein, Body Fluid Other: 3.5 g/dL

## 2024-04-08 NOTE — Assessment & Plan Note (Addendum)
 Active smoker - last echo 05/24/23 Mild LAE with mild AR  Nl EF and nl R sided pressures  but no diastolic measures available - s/o R chest trauma with rib fx txgiving 2025  with ESR and hgb ok 04/04/2024     Most likely given the time course of evolving symptoms since fx R ribs the new R Pl effusion is related to that with ddx including partial hemothorax, post cardiac injury syndrome (Dressler's like inflammatory process though ESR against this or unrelated urinothorax given her issues with renal dysfunction (requires obstruction which is apparent on her abd us  when the effusion was first noted or underlying malignancy in this active smoker.   Rec:  >>> tap dry and repeat cxr / check labs when avaialble  Discussed in detail all the  indications, usual  risks and alternatives  relative to the benefits with patient who agrees to proceed with w/u as outlined.

## 2024-04-08 NOTE — Assessment & Plan Note (Addendum)
 02 dep at hs at 2.5 lpm as of 1st pulmonary eval 04/04/2024   Removing the R effusion should help some  >>>  observe 02 sats and titrate daytime 02 to keep > 90% - see avs for instructions unique to this ov

## 2024-04-08 NOTE — Assessment & Plan Note (Addendum)
 Counseled re importance of smoking cessation but did not meet time criteria for separate billing   >>> discuss alpha one carrier status on return     Each maintenance medication was reviewed in detail including emphasizing most importantly the difference between maintenance and prns and under what circumstances the prns are to be triggered using an action plan format where appropriate.  Total time for H and P, chart review, counseling, reviewing hfa/ neb/ 02 /pulse ox  device(s) and generating customized AVS unique to this office visit / same day charting = 47 min complex new pt eval.

## 2024-04-08 NOTE — Assessment & Plan Note (Addendum)
 Active smoker - Alpha one AT  phenotype  MZ level   139 on 04/04/24  with ? Response to breeztri/ saba by hx suggests component of copd  >>>  Rec Breztri  Take 2 puffs first thing in am and then another 2 puffs about 12 hours later.  And approp saba:  Re SABA :  I spent extra time with pt today reviewing appropriate use of albuterol  for prn use on exertion with the following points: 1) saba is for relief of sob that does not improve by walking a slower pace or resting but rather if the pt does not improve after trying this first. 2) If the pt is convinced, as many are, that saba helps recover from activity faster then it's easy to tell if this is the case by re-challenging : ie stop, take the inhaler, then p 5 minutes try the exact same activity (intensity of workload) that just caused the symptoms and see if they are substantially diminished or not after saba 3) if there is an activity that reproducibly causes the symptoms, try the saba 15 min before the activity on alternate days   If in fact the saba really does help, then fine to continue to use it prn but advised may need to look closer at the maintenance regimen being used to achieve better control of airways disease with exertion.   >>> PFTs  once effusion is addressed  Commente  Note that pleural effusion and copd have the same effect on insp muscles/mechanics (both shorten their length prior to inspiration making them weaker with less force reserve) so they are synergistic in causing doe.

## 2024-04-10 LAB — CYTOLOGY - NON PAP

## 2024-04-11 ENCOUNTER — Ambulatory Visit: Payer: Self-pay | Admitting: Internal Medicine

## 2024-04-11 NOTE — Progress Notes (Signed)
 Atc x1 lmtcb

## 2024-04-11 NOTE — Telephone Encounter (Signed)
 Copied from CRM #[id number]. Topic: Clinical - Lab/Test Results >> Apr 11, 2024  8:59 AM Corean SAUNDERS wrote: Reason for CRM: Per the note from [facility] and Dr. Darlean regarding lab results on 1/28 - note relayed verbatim and patient verbalized understanding and has a follow up appointment on 2/26  Result relayed by call center

## 2024-04-11 NOTE — Telephone Encounter (Signed)
 Patient evaluated for surgical clearance by S. Sheron, PA-C 03/20/2024.  Further testing recommended and is currently pending.  Further recommendations regarding perioperative cardiac risk to follow, per S. Sheron, PA-C.  Will remove from preop pool. Glendia Ferrier, PA-C    04/11/2024 7:34 AM

## 2024-04-18 ENCOUNTER — Ambulatory Visit (HOSPITAL_COMMUNITY)

## 2024-04-19 ENCOUNTER — Ambulatory Visit: Admitting: Internal Medicine

## 2024-04-20 ENCOUNTER — Encounter: Payer: Self-pay | Admitting: Nurse Practitioner

## 2024-04-20 ENCOUNTER — Ambulatory Visit: Admitting: Nurse Practitioner

## 2024-04-20 VITALS — BP 110/60 | HR 85 | Ht 65.0 in | Wt 139.6 lb

## 2024-04-20 DIAGNOSIS — I10 Essential (primary) hypertension: Secondary | ICD-10-CM

## 2024-04-20 DIAGNOSIS — Z794 Long term (current) use of insulin: Secondary | ICD-10-CM

## 2024-04-20 DIAGNOSIS — E1159 Type 2 diabetes mellitus with other circulatory complications: Secondary | ICD-10-CM

## 2024-04-20 DIAGNOSIS — E559 Vitamin D deficiency, unspecified: Secondary | ICD-10-CM

## 2024-04-20 DIAGNOSIS — E782 Mixed hyperlipidemia: Secondary | ICD-10-CM

## 2024-04-20 NOTE — Progress Notes (Signed)
 "                                                                                      04/20/2024, 12:07 PM                                Endocrinology follow-up note   Subjective:    Patient ID: [name], female    DOB: 1955/12/03.  Grace Hess is being seen in follow-up for management of currently uncontrolled symptomatic diabetes requested by  Shona Norleen PEDLAR, MD.   Past Grace Hess History:  Diagnosis Date   Anxiety    CAD (coronary artery disease)    a. s/p DES to RCA in 07/2016 with residual mid-LAD stenosis   Cancer (Grace Hess)    skin cancer on finger,, cancer left kidney   Cervicalgia    Chest pain, unspecified    Chronic kidney disease    Depression    GERD (gastroesophageal reflux disease)    Grace Hess    bad ones after my stroke in 2016; only have them when I get bad news now (08/02/2016)   Heart murmur    Heavy smoker (more than 20 cigarettes per day)    Scheduled for LDCT screening 02/11/15   History of hiatal hernia    History of kidney stones    no cysto/OR (12/10/2014)   Hypercholesteremia    Hypertension    Incarcerated incisional hernia 08/05/2022   Left renal mass    Obesity    Palpitations    Reflux esophagitis    Stroke Grace Hess)    they said I had a  MINI stroke a few months back/MRI (12/10/2014)   TIA (transient ischemic attack) 11/28/2014   Grace Hess 11/28/2014   Type II diabetes mellitus (Grace Hess)     Past Surgical History:  Procedure Laterality Date   ABDOMINAL AORTOGRAM N/A 08/02/2016   Procedure: Abdominal Aortogram;  Surgeon: Mady Bruckner, MD;  Location: [facility];  Service: Cardiovascular;  Laterality: N/A;   CAROTID STENT     CHOLECYSTECTOMY OPEN  1978   COLONOSCOPY  2010   single diverticulum in sigmoid colon   CORONARY ANGIOPLASTY WITH STENT PLACEMENT  08/02/2016   to RCA   CORONARY STENT INTERVENTION N/A 08/02/2016   Procedure: Coronary Stent Intervention;  Surgeon: Mady Bruckner, MD;  Location: [facility];   Service: Cardiovascular;  Laterality: N/A;  RCA   CYSTOSCOPY W/ RETROGRADES Bilateral 07/15/2021   Procedure: CYSTOSCOPY WITH RETROGRADE PYELOGRAM;  Surgeon: Roseann Adine PARAS., MD;  Location: [facility];  Service: Urology;  Laterality: Bilateral;   CYSTOSCOPY WITH STENT PLACEMENT Left 07/15/2021   Procedure: CYSTOSCOPY WITH STENT PLACEMENT;  Surgeon: Roseann Adine PARAS., MD;  Location: [facility];  Service: Urology;  Laterality: Left;   ENDARTERECTOMY Left 12/02/2014   Procedure: ENDARTERECTOMY CAROTID;  Surgeon: Krystal JULIANNA Doing, MD;  Location: [facility];  Service: Vascular;  Laterality: Left;   HERNIA REPAIR     LAPAROSCOPIC NEPHRECTOMY Left    LAPAROSCOPIC NEPHRECTOMY Left 11/25/2021   Procedure: LEFT LAPAROSCOPIC RADICAL NEPHRECTOMY;  Surgeon: Cam Morene ORN, MD;  Location: [facility];  Service: Urology;  Laterality: Left;   LEFT HEART CATH AND CORONARY ANGIOGRAPHY N/A 08/02/2016   Procedure: Left Heart Cath and Coronary Angiography;  Surgeon: Mady Bruckner, MD;  Location: [facility];  Service: Cardiovascular;  Laterality: N/A;   LOWER EXTREMITY ANGIOGRAM  08/02/2016   LOWER EXTREMITY ANGIOGRAPHY N/A 08/02/2016   Procedure: Lower Extremity Angiography;  Surgeon: Mady Bruckner, MD;  Location: [facility];  Service: Cardiovascular;  Laterality: N/A;   NEPHRECTOMY Left    VAGINAL HYSTERECTOMY  1991   partial   VENTRAL HERNIA REPAIR N/A 08/05/2022   Procedure: LAPAROSCOPIC VENTRAL WALL HERNIA REPAIR WITH MESH;  Surgeon: Sheldon Standing, MD;  Location: [facility];  Service: General;  Laterality: N/A;    Social History   Socioeconomic History   Marital status: Married    Spouse name: Not on file   Number of children: 2   Years of education: 12   Highest education level: Not on file  Occupational History   Not on file  Tobacco Use   Smoking status: Every Day    Current packs/day: 1.50    Average packs/day: 1.5 packs/day for 49.9 years (74.9 ttl pk-yrs)    Types: Cigarettes     Start date: 05/17/1974   Smokeless tobacco: Never  Vaping Use   Vaping status: Never Used  Substance and Sexual Activity   Alcohol use: No    Alcohol/week: 0.0 standard drinks of alcohol   Drug use: No   Sexual activity: Not Currently  Other Topics Concern   Not on file  Social History Narrative   Patient does not drink caffeine.   Patient is right handed.    Social Drivers of Health   Tobacco Use: Grace Hess (04/20/2024)   Patient History    Smoking Tobacco Use: Every Day    Smokeless Tobacco Use: Never    Passive Exposure: Not on file  Financial Resource Strain: Low Hess (05/18/2022)   Received from [facility]   Overall Financial Resource Strain (CARDIA)    Difficulty of Paying Living Expenses: Not hard at all  Food Insecurity: No Food Insecurity (05/24/2023)   Hunger Vital Sign    Worried About Running Out of Food in the Last Year: Never true    Ran Out of Food in the Last Year: Never true  Transportation Needs: No Transportation Needs (05/24/2023)   PRAPARE - Administrator, Civil Service (Grace Hess): No    Lack of Transportation (Non-Grace Hess): No  Physical Activity: Not on file  Stress: Not on file  Social Connections: Moderately Isolated (05/20/2023)   Social Connection and Isolation Panel    Frequency of Communication with Friends and Family: Once a week    Frequency of Social Gatherings with Friends and Family: Once a week    Attends Religious Services: 1 to 4 times per year    Active Member of [employer] or Organizations: No    Attends Banker Meetings: Never    Marital Status: Married  Depression (PHQ2-9): Low Hess (02/29/2024)   Depression (PHQ2-9)    PHQ-2 Score: 0  Alcohol Screen: Not on file  Housing: Low Hess (05/24/2023)   Housing Stability Vital Sign    Unable to Pay for Housing in the Last Year: No    Number of Times Moved in the Last Year: 0    Homeless in the Last Year: No  Utilities: Not At Hess (05/20/2023)   AHC Utilities     Threatened with  loss of utilities: No  Health Literacy: Not on file    Family History  Problem Relation Age of Onset   Congestive Heart Failure Mother    COPD Father    Cancer Brother    Cancer Brother    Breast cancer Neg Hx     Outpatient Encounter Medications as of 04/20/2024  Medication Sig   acetaminophen  (TYLENOL ) 325 MG tablet Take 2 tablets (650 mg total) by mouth every 4 (four) hours as needed for Grace Hess or mild pain.   albuterol  (PROVENTIL ) (2.5 MG/3ML) 0.083% nebulizer solution Take 3 mLs (2.5 mg total) by nebulization every 6 (six) hours as needed for wheezing or shortness of breath.   albuterol  (VENTOLIN  HFA) 108 (90 Base) MCG/ACT inhaler Inhale 2 puffs into the lungs every 6 (six) hours as needed.   ALPRAZolam  (XANAX ) 0.5 MG tablet Take 0.25 mg by mouth 2 (two) times daily.   atorvastatin  (LIPITOR ) 40 MG tablet TAKE 1 TABLET(40 MG) BY MOUTH DAILY   Budeson-Glycopyrrol-Formoterol  (BREZTRI  AEROSPHERE) 160-9-4.8 MCG/ACT AERO Inhale into the lungs. 1-2 times per day   calcium  acetate (PHOSLO ) 667 MG tablet Take 667 mg by mouth daily. with food   Cholecalciferol (VITAMIN D3) 125 MCG (5000 UT) CAPS Take 5,000 Units by mouth once a week. Tuesday   clopidogrel  (PLAVIX ) 75 MG tablet Take 1 tablet (75 mg total) by mouth daily.   escitalopram  (LEXAPRO ) 20 MG tablet Take 20 mg by mouth daily.   gabapentin  (NEURONTIN ) 300 MG capsule Take 300 mg by mouth every other day. At night   glucose blood (ACCU-CHEK GUIDE) test strip Use as instructed   insulin  degludec (TRESIBA  FLEXTOUCH) 100 UNIT/ML FlexTouch Pen Inject 20 Units into the skin at bedtime. (Patient taking differently: Inject 20 Units into the skin at bedtime. Patient states that she injects insulin  depending on what she eats, not every night)   Insulin  Pen Needle (PEN NEEDLES) 31G X 6 MM MISC Use to inject insulin  once daily   LOKELMA  10 g PACK packet Take 10 g by mouth See admin instructions. Five times a week   magnesium   gluconate (MAGONATE) 500 (27 Mg) MG TABS tablet Take 100 mg by mouth in the morning and at bedtime.   nystatin cream (MYCOSTATIN) Apply 1 application  topically 2 (two) times daily as needed (Itching).   ondansetron  (ZOFRAN ) 4 MG tablet Take 1 tablet (4 mg total) by mouth every 6 (six) hours as needed for nausea or vomiting.   oxyCODONE -acetaminophen  (PERCOCET/ROXICET) 5-325 MG tablet Take 1 tablet by mouth every 6 (six) hours as needed for severe pain or moderate pain.   pantoprazole  (PROTONIX ) 40 MG tablet TAKE 1 TABLET(40 MG) BY MOUTH DAILY   sodium bicarbonate  650 MG tablet Take 650 mg by mouth 2 (two) times daily.   tizanidine  (ZANAFLEX ) 2 MG capsule Take 1 capsule (2 mg total) by mouth 3 (three) times daily as needed for muscle spasms. Do not drink alcohol or drive while taking this medication.  May cause drowsiness.   No facility-administered encounter medications on file as of 04/20/2024.    ALLERGIES: Allergies  Allergen Reactions   Codeine Shortness Of Breath   Sulfa Antibiotics Other (See Comments)    Unknown    Latex Itching    VACCINATION STATUS: Immunization History  Administered Date(s) Administered   Influenza,inj,Quad PF,6+ Mos 11/29/2014   Pneumococcal Polysaccharide-23 11/29/2014    Diabetes She presents for her follow-up diabetic visit. She has type 2 diabetes mellitus. Onset time: She was diagnosed at  approximate age of 50 years. Her disease course has been improving. There are no hypoglycemic associated symptoms. Pertinent negatives for hypoglycemia include no confusion, pallor or seizures. Associated symptoms include fatigue and foot paresthesias. Pertinent negatives for diabetes include no polydipsia, no polyphagia, no polyuria and no weight loss. There are no hypoglycemic complications. Symptoms are stable. Diabetic complications include heart disease and nephropathy. Hess factors for coronary artery disease include dyslipidemia, diabetes mellitus, family history,  hypertension, tobacco exposure, sedentary lifestyle and post-menopausal. Current diabetic treatment includes insulin  injections. She is compliant with treatment most of the time. Her weight is fluctuating minimally. She is following a generally healthy diet. When asked about meal planning, she reported none. She has not had a previous visit with a dietitian. She never participates in exercise. Her home blood glucose trend is increasing steadily. Her breakfast blood glucose range is generally 140-180 mg/dl. Her overall blood glucose range is >200 mg/dl. (She presents today with her meter and logs showing inconsistent glucose monitoring and slightly above target fasting glycemic profile.  Her most recent A1c on 12/11 was 8.5%, improving from last visit of 9.9%.  Analysis of her meter shows 7-day average of 143 (with 5 readings), 14-day average of 136 (12 readings), 30-day average of 149 (21 readings), 90-day average of 158 (67 readings).  She admits she has not been taking her Tresiba  every night, says she will skip it when she eats right.) An ACE inhibitor/angiotensin II receptor blocker is being taken. She does not see a podiatrist.Eye exam is not current.    Review of systems  Constitutional: + stable body weight,  current Body mass index is 23.23 kg/m. , no fatigue, no subjective hyperthermia, no subjective hypothermia Eyes: no blurry vision, no xerophthalmia ENT: no sore throat, no nodules palpated in throat, no dysphagia/odynophagia, no hoarseness Cardiovascular: no chest pain, no shortness of breath, no palpitations, no leg swelling Respiratory: no cough, no shortness of breath Gastrointestinal: no nausea/vomiting/diarrhea Musculoskeletal: no muscle/joint aches Skin: no rashes, no hyperemia Neurological: no tremors, no dizziness, + numbness/tingling to BLE (feet) Psychiatric: no depression, no anxiety  Objective:    BP 110/60 (BP Location: Left Arm, Patient Position: Sitting, Cuff Size:  Large)   Pulse 85   Ht 5' 5 (1.651 m)   Wt 139 lb 9.6 oz (63.3 kg)   BMI 23.23 kg/m   Wt Readings from Last 3 Encounters:  04/20/24 139 lb 9.6 oz (63.3 kg)  04/04/24 142 lb (64.4 kg)  03/20/24 136 lb 12.8 oz (62.1 kg)    BP Readings from Last 3 Encounters:  04/20/24 110/60  04/06/24 (!) 167/73  04/04/24 (!) 142/68    Physical Exam- Limited  Constitutional:  Body mass index is 23.23 kg/m. , not in acute distress, normal state of mind Eyes:  EOMI, no exophthalmos Musculoskeletal: no gross deformities, strength intact in all four extremities, no gross restriction of joint movements Skin:  no rashes, no hyperemia Neurological: no tremor with outstretched hands  Diabetic Foot Exam - Simple   No data filed    CMP     Component Value Date/Time   NA 140 04/04/2024 0948   K 4.7 04/04/2024 0948   CL 101 04/04/2024 0948   CO2 21 04/04/2024 0948   GLUCOSE 188 (H) 04/04/2024 0948   GLUCOSE 291 (H) 02/16/2024 0846   BUN 42 (H) 04/04/2024 0948   CREATININE 3.02 (H) 04/04/2024 0948   CREATININE 1.38 (H) 08/20/2019 0719   CALCIUM  8.8 04/04/2024 0948   CALCIUM  9.1  02/24/2024 1227   PROT 6.5 04/04/2024 0948   ALBUMIN  3.6 (L) 04/04/2024 0948   AST 13 04/04/2024 0948   ALT 8 04/04/2024 0948   ALKPHOS 133 04/04/2024 0948   BILITOT 0.2 04/04/2024 0948   GFRNONAA 13 (L) 02/16/2024 0846   GFRNONAA 40 (L) 08/20/2019 0719   GFRAA 51 (L) 03/26/2020 0927   GFRAA 47 (L) 08/20/2019 0719     Diabetic Labs (most recent): Lab Results  Component Value Date   HGBA1C 9.9 (A) 01/13/2024   HGBA1C 8.2 10/05/2023   HGBA1C 7.8 (A) 08/11/2023   MICROALBUR 26.7 08/20/2019    Lipid Panel     Component Value Date/Time   CHOL 168 05/25/2023 0413   CHOL 161 03/30/2021 0803   TRIG 300 (A) 10/05/2023 0000   HDL 38 (L) 05/25/2023 0413   HDL 35 (L) 03/30/2021 0803   CHOLHDL 4.4 05/25/2023 0413   VLDL 54 (H) 05/25/2023 0413   LDLCALC 93 10/05/2023 0000   LDLCALC 90 03/30/2021 0803   LDLCALC  69 01/16/2019 0937   LABVLDL 36 03/30/2021 0803       Assessment & Plan:   1) DM type 2 causing vascular disease (Grace Hess)  - ALYZAE HAWKEY has currently uncontrolled symptomatic type 2 DM since  69 years of age.  She presents today with her meter and logs showing inconsistent glucose monitoring and slightly above target fasting glycemic profile.  Her most recent A1c on 12/11 was 8.5%, improving from last visit of 9.9%.  Analysis of her meter shows 7-day average of 143 (with 5 readings), 14-day average of 136 (12 readings), 30-day average of 149 (21 readings), 90-day average of 158 (67 readings).  She admits she has not been taking her Tresiba  every night, says she will skip it when she eats right.  -Recent labs reviewed.  - I had a long discussion with her about the progressive nature of diabetes and the pathology behind its complications. -her diabetes is complicated by coronary artery disease, CVA, nephropathy, peripheral neuropathy, chronic heavy smoking, sedentary life and she remains at a Grace Hess for more acute and chronic complications which include CAD, CVA, CKD, retinopathy, and neuropathy. These are all discussed in detail with her.  - Nutritional counseling repeated/built upon at each appointment.  - The patient admits there is a room for improvement in their diet and drink choices. -  Suggestion is made for the patient to avoid simple carbohydrates from their diet including Cakes, Sweet Desserts / Pastries, Ice Cream, Soda (diet and regular), Sweet Tea, Candies, Chips, Cookies, Sweet Pastries, Store Bought Juices, Alcohol in Excess of 1-2 drinks a day, Artificial Sweeteners, Coffee Creamer, and Sugar-free Products. This will help patient to have stable blood glucose profile and potentially avoid unintended weight gain.   - I encouraged the patient to switch to unprocessed or minimally processed complex starch and increased protein intake (animal or plant source), fruits, and  vegetables.   - Patient is advised to stick to a routine mealtimes to eat 3 meals a day and avoid unnecessary snacks (to snack only to correct hypoglycemia).  - I have approached her with the following individualized plan to manage  her diabetes and patient agrees:   -She is advised to restart taking her Tresiba  but at lower dose of 15 units SQ nightly (every night unless night time glucose less than 100).    The aim is to try and get fasting readings between 90-130 consistently.  She is advised to restart monitoring  glucose twice daily, before breakfast and before bed, and to call the clinic if she has readings less than 70 or above 200 for 3 tests in a row. She did not care for the Dexcom.  She is not a candidate for Metformin  due to kidney disease, she does not meet criteria for GLP1 use with BMI less than 25.  She was on Glipizide  in the past but had severe hypoglycemia.  - Specific targets for  A1c;  LDL, HDL, Triglycerides, and  Waist Circumference were discussed with the patient.  2) Blood Pressure /Hypertension:  Her blood pressure is tight, recently taken off all BP meds.    3) Lipids/Hyperlipidemia:   Her most recent lipid panel from 02/23/24 shows controlled LDL of 65.  She is advised to continue Lipitor  40 mg po daily at bedtime.  Side effects and precautions discussed with her.  She is advised to avoid fried foods and butter.    4)  Weight/Diet:  Her Body mass index is 23.23 kg/m..  she is not a candidate for more weight loss.   Exercise, and detailed carbohydrates information provided  -  detailed on discharge instructions.  5) Chronic Care/Health Maintenance: -she is on ACEI/ARB and Statin medications and is encouraged to initiate and continue to follow up with Ophthalmology, Dentist, Podiatrist at least yearly or according to recommendations, and advised to quit smoking. I have recommended yearly flu vaccine and pneumonia vaccine at least every 5 years; moderate intensity  exercise for up to 150 minutes weekly; and sleep for at least 7 hours a day.  - she is advised to maintain close follow up with Shona Norleen PEDLAR, MD for primary care needs.    I spent  32  minutes in the care of the patient today including review of labs from CMP, Lipids, Thyroid  Function, Hematology (current and previous including abstractions from other facilities); face-to-face time discussing  her blood glucose readings/logs, discussing hypoglycemia and hyperglycemia episodes and symptoms, medications doses, her options of short and long term treatment based on the latest standards of care / guidelines;  discussion about incorporating lifestyle medicine;  and documenting the encounter. Hess reduction counseling performed per USPSTF guidelines to reduce obesity and cardiovascular Hess factors.     Please refer to Patient Instructions for Blood Glucose Monitoring and Insulin /Medications Dosing Guide  in media tab for additional information. Please  also refer to  Patient Self Inventory in the Media  tab for reviewed elements of pertinent patient history.  Rudell CHRISTELLA Budge participated in the discussions, expressed understanding, and voiced agreement with the above plans.  All questions were answered to her satisfaction. she is encouraged to contact clinic should she have any questions or concerns prior to her return visit.   Follow up plan: - Return in about 3 months (around 07/18/2024) for Diabetes F/U with A1c in office, No previsit labs, Bring meter and logs.  Benton Rio, [facility] [address] [place], [place] [address] Phone: [phone] Fax: [phone]  04/20/2024, 12:07 PM    "

## 2024-04-26 ENCOUNTER — Other Ambulatory Visit (HOSPITAL_COMMUNITY)

## 2024-05-09 ENCOUNTER — Ambulatory Visit: Admitting: Physician Assistant

## 2024-05-10 ENCOUNTER — Ambulatory Visit: Admitting: Internal Medicine

## 2024-06-01 ENCOUNTER — Inpatient Hospital Stay

## 2024-06-06 ENCOUNTER — Inpatient Hospital Stay: Admitting: Oncology

## 2024-07-18 ENCOUNTER — Ambulatory Visit: Admitting: Nurse Practitioner
